# Patient Record
Sex: Male | Born: 1955 | Race: White | Hispanic: No | Smoking: Current every day smoker
Health system: Southern US, Community
[De-identification: ages and names within clinical notes are randomized; demographics above are authoritative.]

## PROBLEM LIST (undated history)

## (undated) ENCOUNTER — Inpatient Hospital Stay: Admission: EM | Payer: Self-pay | Source: Home / Self Care

## (undated) ENCOUNTER — Emergency Department (HOSPITAL_COMMUNITY): Payer: Medicare HMO | Source: Home / Self Care

## (undated) DIAGNOSIS — I779 Disorder of arteries and arterioles, unspecified: Secondary | ICD-10-CM

## (undated) DIAGNOSIS — I714 Abdominal aortic aneurysm, without rupture, unspecified: Secondary | ICD-10-CM

## (undated) DIAGNOSIS — I10 Essential (primary) hypertension: Secondary | ICD-10-CM

## (undated) DIAGNOSIS — I2510 Atherosclerotic heart disease of native coronary artery without angina pectoris: Secondary | ICD-10-CM

## (undated) DIAGNOSIS — E119 Type 2 diabetes mellitus without complications: Secondary | ICD-10-CM

## (undated) DIAGNOSIS — F17200 Nicotine dependence, unspecified, uncomplicated: Secondary | ICD-10-CM

## (undated) DIAGNOSIS — K635 Polyp of colon: Secondary | ICD-10-CM

## (undated) DIAGNOSIS — R0600 Dyspnea, unspecified: Secondary | ICD-10-CM

## (undated) DIAGNOSIS — Z87898 Personal history of other specified conditions: Secondary | ICD-10-CM

## (undated) DIAGNOSIS — J449 Chronic obstructive pulmonary disease, unspecified: Secondary | ICD-10-CM

## (undated) DIAGNOSIS — I499 Cardiac arrhythmia, unspecified: Secondary | ICD-10-CM

## (undated) DIAGNOSIS — I739 Peripheral vascular disease, unspecified: Secondary | ICD-10-CM

## (undated) DIAGNOSIS — H269 Unspecified cataract: Secondary | ICD-10-CM

## (undated) DIAGNOSIS — Z8673 Personal history of transient ischemic attack (TIA), and cerebral infarction without residual deficits: Secondary | ICD-10-CM

## (undated) DIAGNOSIS — R002 Palpitations: Secondary | ICD-10-CM

## (undated) DIAGNOSIS — I639 Cerebral infarction, unspecified: Secondary | ICD-10-CM

## (undated) DIAGNOSIS — M1990 Unspecified osteoarthritis, unspecified site: Secondary | ICD-10-CM

## (undated) DIAGNOSIS — M545 Low back pain, unspecified: Secondary | ICD-10-CM

## (undated) DIAGNOSIS — K219 Gastro-esophageal reflux disease without esophagitis: Secondary | ICD-10-CM

## (undated) DIAGNOSIS — M5416 Radiculopathy, lumbar region: Secondary | ICD-10-CM

## (undated) DIAGNOSIS — E782 Mixed hyperlipidemia: Secondary | ICD-10-CM

## (undated) HISTORY — DX: Essential (primary) hypertension: I10

## (undated) HISTORY — PX: JOINT REPLACEMENT: SHX530

## (undated) HISTORY — DX: Peripheral vascular disease, unspecified: I73.9

## (undated) HISTORY — DX: Atherosclerotic heart disease of native coronary artery without angina pectoris: I25.10

## (undated) HISTORY — DX: Unspecified cataract: H26.9

## (undated) HISTORY — PX: BREAST SURGERY: SHX581

## (undated) HISTORY — DX: Personal history of transient ischemic attack (TIA), and cerebral infarction without residual deficits: Z86.73

## (undated) HISTORY — DX: Polyp of colon: K63.5

## (undated) HISTORY — PX: KNEE SURGERY: SHX244

## (undated) HISTORY — DX: Chronic obstructive pulmonary disease, unspecified: J44.9

## (undated) HISTORY — PX: CARDIAC CATHETERIZATION: SHX172

## (undated) HISTORY — DX: Type 2 diabetes mellitus without complications: E11.9

## (undated) HISTORY — DX: Gastro-esophageal reflux disease without esophagitis: K21.9

## (undated) HISTORY — PX: HERNIA REPAIR: SHX51

## (undated) HISTORY — DX: Disorder of arteries and arterioles, unspecified: I77.9

## (undated) HISTORY — DX: Mixed hyperlipidemia: E78.2

---
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
## 1991-03-05 HISTORY — PX: UMBILICAL HERNIA REPAIR: SHX196

## 1991-03-05 HISTORY — PX: CHOLECYSTECTOMY: SHX55

## 2004-12-25 ENCOUNTER — Ambulatory Visit: Payer: Self-pay | Admitting: *Deleted

## 2005-01-01 ENCOUNTER — Encounter (HOSPITAL_COMMUNITY): Admission: RE | Admit: 2005-01-01 | Discharge: 2005-01-31 | Payer: Self-pay | Admitting: *Deleted

## 2005-01-01 ENCOUNTER — Ambulatory Visit: Payer: Self-pay | Admitting: Cardiology

## 2006-08-18 ENCOUNTER — Ambulatory Visit: Payer: Self-pay | Admitting: Cardiovascular Disease

## 2006-08-26 ENCOUNTER — Ambulatory Visit: Payer: Self-pay | Admitting: Cardiovascular Disease

## 2006-08-27 ENCOUNTER — Encounter (HOSPITAL_COMMUNITY): Admission: RE | Admit: 2006-08-27 | Discharge: 2006-09-26 | Payer: Self-pay | Admitting: Cardiovascular Disease

## 2006-08-27 ENCOUNTER — Ambulatory Visit: Payer: Self-pay | Admitting: Cardiovascular Disease

## 2006-11-24 ENCOUNTER — Emergency Department (HOSPITAL_COMMUNITY): Admission: EM | Admit: 2006-11-24 | Discharge: 2006-11-24 | Payer: Self-pay | Admitting: Emergency Medicine

## 2006-12-03 ENCOUNTER — Inpatient Hospital Stay (HOSPITAL_COMMUNITY): Admission: EM | Admit: 2006-12-03 | Discharge: 2006-12-04 | Payer: Self-pay | Admitting: Emergency Medicine

## 2006-12-05 ENCOUNTER — Encounter (HOSPITAL_COMMUNITY): Admission: RE | Admit: 2006-12-05 | Discharge: 2007-01-04 | Payer: Self-pay | Admitting: Urology

## 2007-01-20 ENCOUNTER — Ambulatory Visit: Payer: Self-pay | Admitting: Internal Medicine

## 2007-02-12 ENCOUNTER — Ambulatory Visit (HOSPITAL_COMMUNITY): Admission: RE | Admit: 2007-02-12 | Discharge: 2007-02-12 | Payer: Self-pay | Admitting: Internal Medicine

## 2007-02-12 ENCOUNTER — Ambulatory Visit: Payer: Self-pay | Admitting: Internal Medicine

## 2007-02-12 HISTORY — PX: COLONOSCOPY: SHX174

## 2007-07-05 ENCOUNTER — Emergency Department (HOSPITAL_COMMUNITY): Admission: EM | Admit: 2007-07-05 | Discharge: 2007-07-05 | Payer: Self-pay | Admitting: Emergency Medicine

## 2007-08-03 ENCOUNTER — Ambulatory Visit (HOSPITAL_COMMUNITY): Admission: RE | Admit: 2007-08-03 | Discharge: 2007-08-03 | Payer: Self-pay | Admitting: Family Medicine

## 2008-09-26 ENCOUNTER — Emergency Department (HOSPITAL_COMMUNITY): Admission: EM | Admit: 2008-09-26 | Discharge: 2008-09-26 | Payer: Self-pay | Admitting: Emergency Medicine

## 2009-03-22 ENCOUNTER — Emergency Department (HOSPITAL_COMMUNITY): Admission: EM | Admit: 2009-03-22 | Discharge: 2009-03-22 | Payer: Self-pay | Admitting: Emergency Medicine

## 2009-05-22 ENCOUNTER — Emergency Department (HOSPITAL_COMMUNITY): Admission: EM | Admit: 2009-05-22 | Discharge: 2009-05-22 | Payer: Self-pay | Admitting: Emergency Medicine

## 2009-07-24 ENCOUNTER — Ambulatory Visit (HOSPITAL_COMMUNITY): Admission: RE | Admit: 2009-07-24 | Discharge: 2009-07-24 | Payer: Self-pay | Admitting: Internal Medicine

## 2010-02-05 ENCOUNTER — Emergency Department (HOSPITAL_COMMUNITY)
Admission: EM | Admit: 2010-02-05 | Discharge: 2010-02-05 | Payer: Self-pay | Source: Home / Self Care | Admitting: Emergency Medicine

## 2010-03-17 ENCOUNTER — Emergency Department (HOSPITAL_COMMUNITY)
Admission: EM | Admit: 2010-03-17 | Discharge: 2010-03-17 | Payer: Self-pay | Source: Home / Self Care | Admitting: Emergency Medicine

## 2010-04-25 ENCOUNTER — Ambulatory Visit (INDEPENDENT_AMBULATORY_CARE_PROVIDER_SITE_OTHER): Payer: BC Managed Care – PPO | Admitting: Cardiology

## 2010-04-25 ENCOUNTER — Other Ambulatory Visit (HOSPITAL_COMMUNITY): Payer: Self-pay | Admitting: *Deleted

## 2010-04-25 ENCOUNTER — Encounter: Payer: Self-pay | Admitting: Cardiology

## 2010-04-25 ENCOUNTER — Other Ambulatory Visit: Payer: Self-pay | Admitting: Cardiology

## 2010-04-25 ENCOUNTER — Encounter (INDEPENDENT_AMBULATORY_CARE_PROVIDER_SITE_OTHER): Payer: Self-pay | Admitting: *Deleted

## 2010-04-25 DIAGNOSIS — R0602 Shortness of breath: Secondary | ICD-10-CM

## 2010-04-25 DIAGNOSIS — E782 Mixed hyperlipidemia: Secondary | ICD-10-CM

## 2010-04-25 DIAGNOSIS — I6529 Occlusion and stenosis of unspecified carotid artery: Secondary | ICD-10-CM | POA: Insufficient documentation

## 2010-04-25 DIAGNOSIS — I10 Essential (primary) hypertension: Secondary | ICD-10-CM

## 2010-04-25 DIAGNOSIS — I2510 Atherosclerotic heart disease of native coronary artery without angina pectoris: Secondary | ICD-10-CM

## 2010-04-25 DIAGNOSIS — F17200 Nicotine dependence, unspecified, uncomplicated: Secondary | ICD-10-CM | POA: Insufficient documentation

## 2010-04-25 DIAGNOSIS — R079 Chest pain, unspecified: Secondary | ICD-10-CM | POA: Insufficient documentation

## 2010-04-26 ENCOUNTER — Encounter: Payer: Self-pay | Admitting: Cardiology

## 2010-04-28 ENCOUNTER — Encounter: Payer: Self-pay | Admitting: Cardiology

## 2010-04-30 LAB — CONVERTED CEMR LAB
ALT: 19 units/L (ref 0–53)
AST: 15 units/L (ref 0–37)
Albumin: 4.4 g/dL (ref 3.5–5.2)
Alkaline Phosphatase: 87 units/L (ref 39–117)
BUN: 21 mg/dL (ref 6–23)
Bilirubin, Direct: 0.1 mg/dL (ref 0.0–0.3)
CO2: 30 meq/L (ref 19–32)
Calcium: 9.5 mg/dL (ref 8.4–10.5)
Chloride: 98 meq/L (ref 96–112)
Cholesterol: 254 mg/dL — ABNORMAL HIGH (ref 0–200)
Creatinine, Ser: 0.89 mg/dL (ref 0.40–1.50)
Glucose, Bld: 105 mg/dL — ABNORMAL HIGH (ref 70–99)
HDL: 36 mg/dL — ABNORMAL LOW (ref >39)
Indirect Bilirubin: 0.3 mg/dL (ref 0.0–0.9)
Potassium: 4.1 meq/L (ref 3.5–5.3)
Sodium: 137 meq/L (ref 135–145)
Total Bilirubin: 0.4 mg/dL (ref 0.3–1.2)
Total CHOL/HDL Ratio: 7.1
Total Protein: 7.2 g/dL (ref 6.0–8.3)
Triglycerides: 709 mg/dL — ABNORMAL HIGH (ref <150)

## 2010-05-01 NOTE — Letter (Signed)
Summary: [facility]  [facility]   Imported By: Faythe Ghee 04/26/2010 11:06:06  _____________________________________________________________________  External Attachment:    Type:   Image     Comment:   External Document

## 2010-05-01 NOTE — Assessment & Plan Note (Signed)
Summary: **PER DR.ANN WEST TO RE-ESTABLISH W/CARDIOLOGY/TG   Visit Type:  New patient Primary Provider:  Dr. Forest Gleason   History of Present Illness: 55 year old male referred to the office to establish cardiology followup. He was seen by Dr. Eden Emms back in 2008 with history of reported nonobstructive coronary artery disease, general noncompliant with medications secondary to no health insurance at the time, and other active cardiac risk factors as detailed below. He was referred for a Myoview which was reassuring, also outlined below.  He indicates he is now following up regularly with his primary care provider, has health insurance, and would like to reestablish for regular evaluation. He does indicate occasional episodes of chest pain as well as NYHA class 2-3 dyspnea on exertion. This is been present over several months.  He has not had followup of his carotid artery disease, moderate as noted back in 2008. Also reports active problems with COPD and continues to smoke cigarettes, although states that he has been trying to cut back.  I note that he is no longer on statin therapy, previously on Lipitor. He has had no recent assessment of lipids as best I can tell.  Today we discussed his medications, importance of compliance overall, and also smoking cessation.  Current Medications (verified): 1)  Lisinopril-Hydrochlorothiazide 20-12.5 Mg Tabs (Lisinopril-Hydrochlorothiazide) .... Take2  Tablets  By Mouth Once A Day 2)  Propranolol Hcl 20 Mg Tabs (Propranolol Hcl) .... Take 1 Tab Two Times A Day 3)  Aspir-Low 81 Mg Tbec (Aspirin) .... Take 1 Tab Daily 4)  Omeprazole 20 Mg Cpdr (Omeprazole) .... 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 (1 pack daily) Regular Exercise - yes Alcohol Use - yes social per pt  Past Medical History: CAD - reportedly mild, nonobstructive, reports four previous cardiac catheterizations (details uncertain) Diabetes Type 2 - managed by diet Hyperlipidemia Hypertension Carotid artery disease C O P D G E R D Hemorrhoids  Past Surgical History: Cyst removed from right breast 20 yrs ago Appendectomy 1970 Cholecystectomy 1993 Umbilical hernia repair 1993  Family History: Father: alive had coronary artery bypass grafting in his 91s Mother: alive carotid surgery Siblings: 1 brother alive and well, 1 sister alive has a blood disorder  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 (1 pack daily) Regular Exercise - yes Alcohol Use - yes social per pt  Review of Systems       The patient complains of chest pain and dyspnea on exertion.  The patient denies anorexia, fever, weight gain, syncope, peripheral edema, prolonged cough, headaches, hemoptysis, melena, and hematochezia.         Otherwise reviewed and negative except as outlined.  Vital Signs:  Patient profile:   55 year old male Height:      67 inches Weight:      168 pounds BMI:     26.41 Pulse rate:   82 / minute BP sitting:   168 / 98  (left arm)  Vitals Entered By: Dreama Saa, CNA (April 25, 2010 8:33 AM)  Physical Exam  Additional Exam:  Normally  nourished appearing male in no acute distress. HEENT: Conjunctiva and lids normal, oropharynx with poor dentition. Neck: Supple, no elevated JVP or significant carotid bruits, no thyromegaly. Lungs: Diminished breath sounds throughout, no wheezing, nonlabored. Cardiac: Regular rate and rhythm, no S3 or significant systolic murmur. Abdomen: Soft, nontender, bowel  sounds present. Skin: Warm and dry. Musculoskeletal: No kyphosis. Extremities: No pitting edema, distal pulses 1-2+. Neuropsychiatric: Alert and oriented x3, affect appropriate.    Nuclear Study  Procedure date:  08/27/2006  Findings:       Clinical data:   Mr. Alcock is a 55 year old patient of [facility] and myself.  He has been having chest pain.   Study was done to rule out ischemia.   STRESS MYOVIEW STUDY:   Patient exercised 5 minutes 30 seconds of the standard Bruce   protocol.  Exercise was stopped due to fatigue.  Maximum heart rate   was 142.  Peak blood pressure was 198/92.   SCINTIGRAHIC RESULTS:  Images were reconstructed in the short axis,   vertical, and horizontal long axis.  Resting and stress images were   normal.  There is no evidence of ischemia or infarction.   IMPRESSION:   Normal stress Myoview study.  Ejection fraction 52%.  MRI EXAM  Procedure date:  08/27/2006  Findings:       IMPRESSION:   1.  Moderate approximately 50% stenosis of the right external carotid   artery at its origin.   2.  Focal saccular prominence at the proximal portion of the right   lingual artery measuring approximately 5 mm.  This may represent a   small pseudoaneurysm versus tortuosity of the vessel.   3.  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  EKG  Procedure date:  04/25/2010  Findings:      Normal sinus rhythm at 80 beats per minute with evidence of left ventricular hypertrophy.  Impression & Recommendations:  Problem # 1:  CORONARY ATHEROSCLEROSIS NATIVE CORONARY ARTERY (ICD-414.01)  Reportedly mild and nonobstructive based on previous cardiac catheterizations, details not at all clear. Last formal ischemic assessment was in 2008 via Myoview. He does report intermittent chest pain and shortness of breath with activity. ECG is relatively nonspecific with mild LVH. Complicating his management has been in  general noncompliance with both followup and medications over time, although he is reestablishing now and indicates that he intends to take his medications and present for followup visits. I reviewed his present regimen. We will advance antihypertensives as noted below, and also plan to schedule an exercise echocardiogram for further evaluation. I will see him back in the office to discuss the results.  The following medications were removed from the medication list:    Aspirin 325 Mg Tabs (Aspirin)    Univasc 15 Mg Tabs (Moexipril hcl) His updated medication list for this problem includes:    Lisinopril-hydrochlorothiazide 20-12.5 Mg Tabs (Lisinopril-hydrochlorothiazide) .Marland Kitchen... Take2  tablets  by mouth once a day    Propranolol Hcl 20 Mg Tabs (Propranolol hcl) .Marland Kitchen... Take 1 tab two times a day    Aspir-low 81 Mg Tbec (Aspirin) .Marland Kitchen... Take 1 tab daily  Problem # 2:  CAROTID ARTERY DISEASE (ICD-433.10)  Moderate as of 2008 with no subsequent followup. Followup carotid duplex imaging would be obtained. He is back on aspirin. Also need to better understand lipid status.  The following medications were removed from the medication list:    Aspirin 325 Mg  Tabs (Aspirin) His updated medication list for this problem includes:    Aspir-low 81 Mg Tbec (Aspirin) .Marland Kitchen... Take 1 tab daily  Problem # 3:  MIXED HYPERLIPIDEMIA (ICD-272.2)  Previously on statin therapy. Followup lipid profile will be obtained.  The following medications were removed from the medication list:    Tricor 145 Mg Tabs (Fenofibrate)    Lipitor 20 Mg Tabs (Atorvastatin calcium)  Problem # 4:  NONDEPENDENT TOBACCO USE DISORDER (ICD-305.1)  We discussed smoking cessation today, including strategies to help him quit, also quit line number.  Problem # 5:  ESSENTIAL HYPERTENSION, BENIGN (ICD-401.1)  Blood pressure not well controlled. Patient states that he is taking his medications. We will increase lisinopril hydrochlorothiazide to  40/25 mg daily. BMET will be obtained.  The following medications were removed from the medication list:    Aspirin 325 Mg Tabs (Aspirin)    Univasc 15 Mg Tabs (Moexipril hcl) His updated medication list for this problem includes:    Lisinopril-hydrochlorothiazide 20-12.5 Mg Tabs (Lisinopril-hydrochlorothiazide) .Marland Kitchen... Take2  tablets  by mouth once a day    Propranolol Hcl 20 Mg Tabs (Propranolol hcl) .Marland Kitchen... Take 1 tab two times a day    Aspir-low 81 Mg Tbec (Aspirin) .Marland Kitchen... Take 1 tab daily  Other Orders: Stress Echo (Stress Echo) Carotid Duplex (Carotid Duplex) Future Orders: T-Basic Metabolic Panel [id number]) ... 04/30/2010 T-Lipid Profile [id number]) ... 04/30/2010 T-Hepatic Function [id number]) ... 04/30/2010  Patient Instructions: 1)  Your physician recommends that you schedule a follow-up appointment in: 1 MONTH 2)  Your physician recommends that you return for lab work in: NEXT WEEK  3)  Your physician has recommended you make the following change in your medication: INCREASE LISINOPRIL/HCTZ TO 2 TABLETS DAILY 4)  Your physician has requested that you have a carotid duplex. This test is an ultrasound of the carotid arteries in your neck. It looks at blood flow through these arteries that supply the brain with blood. Allow one hour for this exam. There are no restrictions or special instructions. 5)  Your physician has requested that you have a stress echocardiogram. For further information please visit [email or web address]  Please follow instruction sheet as given. Prescriptions: LISINOPRIL-HYDROCHLOROTHIAZIDE 20-12.5 MG TABS (LISINOPRIL-HYDROCHLOROTHIAZIDE) Take2  tablets  by mouth once a day  #60 x 3   Entered by:   Teressa Lower RN   Authorized by:   Loreli Slot, MD, [facility]   Signed by:   Teressa Lower RN on 04/25/2010   Method used:   Electronically to        [facility] [address] * (retail)       [address]       [place], [place]  [address]       Ph:  [phone] or [phone]       Fax: [phone]   RxID:   [id number]

## 2010-05-01 NOTE — Letter (Signed)
Summary: Stress Echocardiogram Information Sheet  [facility]  [address], [place] [address]   Phone: [phone]  Fax: [phone]      April 25, 2010 MRN: [id number] light prior to the test.   Jimmy Duran  Doctor: Appointment Date: Appointment Time: Appointment Location: [facility]  Stress Echocardiogram Information Sheet    Instructions:   1. DO NOT  take your AM   medicine MORNING before the test.  2.NOTHING TO EAT AFTER MIDNIGHT PRIOR TO THE TEST  3. Dress prepared to exercise.  4. DO NOT use ANY caffine or tobacco products 3 hours before appointment.  5. Report to the Short Stay Center on the1st floor.  6. Please bring all current prescription medications.  7. If you have any questions, please call [phone]

## 2010-05-01 NOTE — Letter (Signed)
Summary: [place] [facility], [facility] at [facility]  [address] [place] [address]   Phone: [phone]  Fax: [phone]     April 25, 2010 MRN: [id number]   Jimmy Duran [address] [place], [place]  [address]      YOUR LAB WORK IS DUE   MONDAY  [id number], 2012  Please go to [facility], located across the street from [facility] on the second floor.  Hours are Monday - Friday 7am until 7:30pm         Saturday 8am until 12noon    __  DO NOT EAT OR DRINK AFTER MIDNIGHT EVENING PRIOR TO LABWORK  __ YOUR LABWORK IS NOT FASTING --YOU MAY EAT PRIOR TO LABWORK

## 2010-05-04 ENCOUNTER — Encounter: Payer: Self-pay | Admitting: *Deleted

## 2010-05-04 DIAGNOSIS — E119 Type 2 diabetes mellitus without complications: Secondary | ICD-10-CM | POA: Insufficient documentation

## 2010-05-04 DIAGNOSIS — K649 Unspecified hemorrhoids: Secondary | ICD-10-CM | POA: Insufficient documentation

## 2010-05-04 DIAGNOSIS — K219 Gastro-esophageal reflux disease without esophagitis: Secondary | ICD-10-CM | POA: Insufficient documentation

## 2010-05-04 DIAGNOSIS — E1159 Type 2 diabetes mellitus with other circulatory complications: Secondary | ICD-10-CM | POA: Insufficient documentation

## 2010-05-04 DIAGNOSIS — J449 Chronic obstructive pulmonary disease, unspecified: Secondary | ICD-10-CM

## 2010-05-07 ENCOUNTER — Encounter: Payer: Self-pay | Admitting: Cardiology

## 2010-05-08 ENCOUNTER — Encounter: Payer: Self-pay | Admitting: Cardiology

## 2010-05-08 ENCOUNTER — Ambulatory Visit (HOSPITAL_COMMUNITY)
Admission: RE | Admit: 2010-05-08 | Discharge: 2010-05-08 | Disposition: A | Discharge status: Home / Self Care | Payer: BC Managed Care – PPO | Source: Ambulatory Visit | Attending: Cardiology | Admitting: Cardiology

## 2010-05-08 ENCOUNTER — Ambulatory Visit (HOSPITAL_COMMUNITY): Payer: BC Managed Care – PPO

## 2010-05-08 DIAGNOSIS — J449 Chronic obstructive pulmonary disease, unspecified: Secondary | ICD-10-CM | POA: Insufficient documentation

## 2010-05-08 DIAGNOSIS — R079 Chest pain, unspecified: Secondary | ICD-10-CM | POA: Insufficient documentation

## 2010-05-08 DIAGNOSIS — R55 Syncope and collapse: Secondary | ICD-10-CM | POA: Insufficient documentation

## 2010-05-08 DIAGNOSIS — I2510 Atherosclerotic heart disease of native coronary artery without angina pectoris: Secondary | ICD-10-CM

## 2010-05-08 DIAGNOSIS — J4489 Other specified chronic obstructive pulmonary disease: Secondary | ICD-10-CM | POA: Insufficient documentation

## 2010-05-08 DIAGNOSIS — E119 Type 2 diabetes mellitus without complications: Secondary | ICD-10-CM | POA: Insufficient documentation

## 2010-05-08 DIAGNOSIS — R072 Precordial pain: Secondary | ICD-10-CM

## 2010-05-09 ENCOUNTER — Encounter: Payer: Self-pay | Admitting: Cardiology

## 2010-05-15 NOTE — Letter (Signed)
Summary: [place] [facility] Engineer, agricultural at [facility]  [address], [place] [address]   Phone: [phone]  Fax: [phone]      May 09, 2010 MRN: [id number]   Jimmy Duran [address] [place], [place]  [address]   Dear [name],  Your test ordered by Selena Batten has been reviewed by your physician (or physician assistant) and was found to be normal or stable. Your physician (or physician assistant) felt no changes were needed at this time.  __X__ Echocardiogram  ____ Cardiac Stress Test  ____ Lab Work  __X__ Peripheral vascular study of arms, legs or neck  ____ CT scan or X-ray  ____ Lung or Breathing test  ____ Other:  Please continue on current medical treatment.  Thank you.   [name], MD, F.A.C.C

## 2010-05-15 NOTE — Letter (Signed)
Summary: [place] Results Engineer, agricultural at [facility]  [address] [place] [address]   Phone: [phone]  Fax: [phone]      May 09, 2010 MRN: [id number]   ABDULAHI SCHOR [address] [place], [place]  [address]   Dear Mr. PHEGLEY,  Your test ordered by Selena Batten has been reviewed by your physician (or physician assistant) and was found to be normal or stable. Your physician (or physician assistant) felt no changes were needed at this time.  ____ Echocardiogram  __X__ Cardiac Stress Test  ____ Lab Work  ____ Peripheral vascular study of arms, legs or neck  ____ CT scan or X-ray  ____ Lung or Breathing test  ____ Other:  Please continue on current medical treatment.  Thank you.   [name], MD, F.A.C.C

## 2010-05-18 ENCOUNTER — Emergency Department (HOSPITAL_COMMUNITY)
Admission: EM | Admit: 2010-05-18 | Discharge: 2010-05-18 | Disposition: A | Discharge status: Home / Self Care | Payer: BC Managed Care – PPO | Attending: Emergency Medicine | Admitting: Emergency Medicine

## 2010-05-18 ENCOUNTER — Emergency Department (HOSPITAL_COMMUNITY): Payer: BC Managed Care – PPO

## 2010-05-18 DIAGNOSIS — B9789 Other viral agents as the cause of diseases classified elsewhere: Secondary | ICD-10-CM | POA: Insufficient documentation

## 2010-05-18 DIAGNOSIS — J449 Chronic obstructive pulmonary disease, unspecified: Secondary | ICD-10-CM | POA: Insufficient documentation

## 2010-05-18 DIAGNOSIS — J4489 Other specified chronic obstructive pulmonary disease: Secondary | ICD-10-CM | POA: Insufficient documentation

## 2010-05-18 DIAGNOSIS — R05 Cough: Secondary | ICD-10-CM | POA: Insufficient documentation

## 2010-05-18 DIAGNOSIS — R059 Cough, unspecified: Secondary | ICD-10-CM | POA: Insufficient documentation

## 2010-05-18 DIAGNOSIS — R0602 Shortness of breath: Secondary | ICD-10-CM | POA: Insufficient documentation

## 2010-05-18 LAB — GLUCOSE, CAPILLARY: Glucose-Capillary: 143 mg/dL — ABNORMAL HIGH (ref 70–99)

## 2010-05-28 ENCOUNTER — Encounter: Payer: Self-pay | Admitting: Cardiology

## 2010-05-28 ENCOUNTER — Ambulatory Visit (INDEPENDENT_AMBULATORY_CARE_PROVIDER_SITE_OTHER): Payer: BC Managed Care – PPO | Admitting: Cardiology

## 2010-05-28 VITALS — BP 133/92 | HR 92 | Ht 67.0 in | Wt 169.0 lb

## 2010-05-28 DIAGNOSIS — I6529 Occlusion and stenosis of unspecified carotid artery: Secondary | ICD-10-CM

## 2010-05-28 DIAGNOSIS — I10 Essential (primary) hypertension: Secondary | ICD-10-CM

## 2010-05-28 DIAGNOSIS — E782 Mixed hyperlipidemia: Secondary | ICD-10-CM

## 2010-05-28 DIAGNOSIS — I2510 Atherosclerotic heart disease of native coronary artery without angina pectoris: Secondary | ICD-10-CM

## 2010-05-28 NOTE — Assessment & Plan Note (Signed)
Continue therapy with omega-3 supplements and Lipitor, aiming to increase omega-3 supplements to total of 4 g daily. Followup fasting lipid profile and liver function tests in 2 months.

## 2010-05-28 NOTE — Assessment & Plan Note (Signed)
Discussed diet, sodium restriction. Continue present regimen.

## 2010-05-28 NOTE — Assessment & Plan Note (Signed)
Clinically stable. Recent followup exercise echocardiogram was reassuring.

## 2010-05-28 NOTE — Progress Notes (Signed)
Clinical Summary Jimmy Duran is a 55 y.o.male Presenting for followup. He was seen in February to reestablish cardiology care, previously seen by Dr. Eden Emms.  In addition to medical therapy adjustments, he was referred for an exercise echocardiogram to reassess for ischemia. The study was performed on 6 March, demonstrating no significant ST segment changes, maximum workload of 10.1 METs, no chest pain, and no inducible wall motion abnormalities to indicate ischemia.  Additional recent studies include chest x-ray from April 06, 2024demonstrating mild bronchitic changes but no focal acute abnormalities, and also a carotid duplex from 6 March showing 50-69% left internal carotid artery stenosis, and less than 50% stenosis in the right internal carotid artery.  Recent lab work from February showed sodium 137, potassium 4.1, BUN 21, creatinine 0.8, cholesterol 254, triglycerides 709, HDL 36, LDL not calculated, AST 15, ALT 19. It was recommended that he begin omega-3 supplements and resume Lipitor.  He was seen in the ER recently with an upper respiratory tract infection. He is using an inhaler. Reports improved symptoms.  Today we reviewed the results of his studies, discussed risk factor modification strategies, also smoking cessation which he continues to work on.  No Known Allergies  Current Outpatient Prescriptions on File Prior to Visit  Medication Sig Dispense Refill  . aspirin 81 MG tablet Take 81 mg by mouth daily.        Marland Kitchen atorvastatin (LIPITOR) 20 MG tablet Take 20 mg by mouth daily.        . fish oil-omega-3 fatty acids 1000 MG capsule Take 2 g by mouth 2 (two) times daily.        Marland Kitchen lisinopril-hydrochlorothiazide (PRINZIDE,ZESTORETIC) 20-12.5 MG per tablet Take 1 tablet by mouth 2 (two) times daily. Take 2 tabs daily       . omeprazole (PRILOSEC) 20 MG capsule Take 20 mg by mouth daily.        . propranolol (INDERAL) 20 MG tablet Take 20 mg by mouth 2 (two) times daily.          Past  Medical History  Diagnosis Date  . Coronary artery disease     Nonobstructive  . Type 2 diabetes mellitus   . Mixed hyperlipidemia   . Essential hypertension, benign   . Carotid artery disease   . COPD (chronic obstructive pulmonary disease)   . GERD (gastroesophageal reflux disease)   . Hemorrhoids     Social History Jimmy Duran reports that he has been smoking Cigarettes.  He has been smoking about 1 pack per day. He has never used smokeless tobacco. Jimmy Duran reports that he drinks alcohol.  Review of Systems No fevers, chills, or unusual weight change. No chest pain, progressive shortness of breath. No palpitations, dizziness, or syncope. No dysphasia or odynophagia. Stable appetite with no abdominal pain, melena, or hematochezia. No orthopnea, PND, or lower extremity edema. No focal motor weakness, memory problems, or speech deficits. Otherwise systems reviewed and negative except as already outlined.   Physical Examination Filed Vitals:   05/28/10 1254  BP: 133/92  Pulse: 92  Normally nourished appearing male in no acute distress. HEENT: Conjunctiva and lids normal, oropharynx with poor dentition. Neck: Supple, no elevated JVP or significant carotid bruits, no thyromegaly. Lungs: Diminished breath sounds throughout, no wheezing, nonlabored. Cardiac: Regular rate and rhythm, no S3 or significant systolic murmur. Abdomen: Soft, nontender, bowel sounds present. Skin: Warm and dry. Musculoskeletal: No kyphosis. Extremities: No pitting edema, distal pulses 1-2+. Neuropsychiatric: Alert and oriented x3, affect appropriate.  Problem List and Plan

## 2010-05-28 NOTE — Assessment & Plan Note (Signed)
Recent carotid Dopplers reviewed. 50-69% left internal carotid artery stenosis is noted, less than 50% on the right. Continue aspirin, lipid management strategies, efforts at smoking cessation. We can repeat this study next March.

## 2010-05-28 NOTE — Assessment & Plan Note (Signed)
Smoking cessation again discussed. 

## 2010-05-28 NOTE — Patient Instructions (Signed)
Your physician recommends that you schedule a follow-up appointment in: 6 months Your physician recommends that you return for lab work in: 2 months Your physician has recommended you make the following change in your medication: increase Omega to 2 tablets twice daily (after a month)

## 2010-06-08 ENCOUNTER — Encounter: Payer: Self-pay | Admitting: *Deleted

## 2010-06-25 ENCOUNTER — Emergency Department (HOSPITAL_COMMUNITY)
Admission: EM | Admit: 2010-06-25 | Discharge: 2010-06-25 | Disposition: A | Discharge status: Home / Self Care | Payer: BC Managed Care – PPO | Attending: Emergency Medicine | Admitting: Emergency Medicine

## 2010-06-25 DIAGNOSIS — J4489 Other specified chronic obstructive pulmonary disease: Secondary | ICD-10-CM | POA: Insufficient documentation

## 2010-06-25 DIAGNOSIS — J449 Chronic obstructive pulmonary disease, unspecified: Secondary | ICD-10-CM | POA: Insufficient documentation

## 2010-06-25 DIAGNOSIS — Z79899 Other long term (current) drug therapy: Secondary | ICD-10-CM | POA: Insufficient documentation

## 2010-06-25 DIAGNOSIS — E7800 Pure hypercholesterolemia, unspecified: Secondary | ICD-10-CM | POA: Insufficient documentation

## 2010-06-25 DIAGNOSIS — I2510 Atherosclerotic heart disease of native coronary artery without angina pectoris: Secondary | ICD-10-CM | POA: Insufficient documentation

## 2010-06-25 DIAGNOSIS — E119 Type 2 diabetes mellitus without complications: Secondary | ICD-10-CM | POA: Insufficient documentation

## 2010-06-25 DIAGNOSIS — K029 Dental caries, unspecified: Secondary | ICD-10-CM | POA: Insufficient documentation

## 2010-06-25 DIAGNOSIS — I10 Essential (primary) hypertension: Secondary | ICD-10-CM | POA: Insufficient documentation

## 2010-06-25 DIAGNOSIS — R51 Headache: Secondary | ICD-10-CM | POA: Insufficient documentation

## 2010-06-26 ENCOUNTER — Telehealth: Payer: Self-pay | Admitting: Cardiology

## 2010-06-26 MED ORDER — LISINOPRIL-HYDROCHLOROTHIAZIDE 20-12.5 MG PO TABS: 1.0000 | ORAL_TABLET | Freq: Two times a day (BID) | ORAL | Status: DC

## 2010-06-26 NOTE — Telephone Encounter (Signed)
Pt was told to take lisinopril twice a day instead of once and has run out of meds. Pt uses [facility] in [place].

## 2010-07-17 NOTE — Op Note (Signed)
NAME:  MARQUIS, DOWN NO.:  [id number]   MEDICAL RECORD NO.:  [id number]          PATIENT TYPE:  INP   LOCATION:  A309                          FACILITY:  [facility]   PHYSICIAN:  Dennie Maizes, M.D.   DATE OF BIRTH:  1955-07-18   DATE OF PROCEDURE:  12/04/2006  DATE OF DISCHARGE:  12/04/2006                               OPERATIVE REPORT   PREOPERATIVE DIAGNOSIS:  Infected cyst of the scrotum.   POSTOPERATIVE DIAGNOSIS:  Infected cyst of the scrotum.   OPERATIVE PROCEDURE:  Incision and drainage of infected cyst of the  scrotum.   ANESTHESIA:  General.   SURGEON:  Dennie Maizes, M.D.   COMPLICATIONS:  None.   ESTIMATED BLOOD LOSS:  Minimal.   DRAINS:  One.   PACKING:  With Iodoform gauze.   INDICATIONS FOR THE PROCEDURE:  This 55 year old male has diabetes  mellitus and other medical problems.  He came to the emergency room with  scrotal pain and swelling.  The clinical features were suggestive of  right infected sebaceous cyst of the scrotum.  The patient was taken to  the operating room today for incision and drainage of the infected cyst  of the scrotum.   DESCRIPTION OF THE PROCEDURE:  General anesthesia was induced, and the  patient was placed on the O.R. table in the dorsal lithotomy position.  The lower abdomen and genitalia were prepped and draped in a sterile  fashion.  Examination revealed induration of the scrotum with  fluctuation on the left scrotal wall.  The swelling measured about 6 x 5  cm in size.  An incision was made over the most fluctuant part of the  swelling.  A small amount of pus was drained.  Pus was sent for culture  and sensitivity, for aerobic as well as anaerobic organisms.  The wound  cavity was then explored with a finger, and all the loculi were broken.  The wound cavity was then irrigated with dilute Betadine, saline, and  hydrogen peroxide solution.  Bleeders were cauterized, and complete  hemostasis was obtained.   The wound cavity was then packed with Iodoform  gauze.  A scrotal dressing was applied.  The sponges and instruments  were correct at the time of conclusion of the procedure.  The patient  was transferred to the PACU in a satisfactory condition.      Dennie Maizes, M.D.  Electronically Signed     SK/MEDQ  D:  12/04/2006  T:  12/05/2006  Job:  [id number]

## 2010-07-17 NOTE — Consult Note (Signed)
NAME:  Jimmy Duran, Jimmy Duran NO.:  [id number]   MEDICAL RECORD NO.:  [id number]          PATIENT TYPE:  AMB   LOCATION:  DAY                           FACILITY:  [facility]   PHYSICIAN:  R. Roetta Sessions, M.D. DATE OF BIRTH:  1956/02/08   DATE OF CONSULTATION:  01/20/2007  DATE OF DISCHARGE:                                 CONSULTATION   REFERRING PHYSICIAN:  Dr. Lewis Moccasin.   REASON FOR CONSULTATION:  Intermittent hematochezia.   HISTORY OF PRESENT ILLNESS:  Jimmy Duran is a 55 year old, Caucasian male  who gives a several year history of intermittent, small-volume  hematochezia.  He has mostly notice bright blood red blood on the toilet  paper with bowel movements.  He denies any proctalgia or pruritus.  He  does have intermittent transient abdominal pains, but tells me nothing  out of the usual.  He denies any history of hard stools or constipation.  He does generally have three loose to semi-formed stools a day.  He has  history of chronic GERD previously on Nexium 40 mg daily, well-  controlled.  He has recently switched to omeprazole 20 mg daily.  He  feels his symptoms are not as well-controlled, but overall he is doing  okay.  He does have some nighttime water brash.  Denies any dysphagia,  odynophagia, anorexia or early satiety and his weight has remained  stable.  Jimmy Duran admits to taking about six BC powders a day.   PAST SURGICAL HISTORY:  1. Colonoscopy about 12 years ago in [place], [place].  He was found      to have hemorrhoids, otherwise normal exam per his report.  2. Status post appendectomy.  3. EGD.   PAST MEDICAL HISTORY:  1. History of hypertension.  2. Hypercholesterolemia.  3. Diabetes mellitus.  4. Chronic GERD.  He had an EGD about 10 years ago in [place] and was      told he had erosions and otherwise normal exam.  5. COPD.   CURRENT MEDICATIONS:  1. BC powders six per day.  2. Omeprazole 20 mg daily.  3. Univasc 15 mg daily.  4.  Norvasc 5 mg daily.  5. Valium 2 mg p.r.n.  6. Xanax 0.5 mg p.r.n.   ALLERGIES:  No known drug allergies.   FAMILY HISTORY:  Positive for multiple second-degree relatives with  colon cancer, although no first degree.  His mother has history of  hypertension and anxiety.  Father has history of coronary disease.  One  sister with diabetes mellitus and one brother who is healthy.   SOCIAL HISTORY:  Jimmy Duran is separated.  He does have a fiance.  He  has five healthy children.  He is unemployed.  He has a 40-pack-year  history of tobacco use.  He consumes a couple of alcoholic beverages per  year socially.  He denies any drug use.   REVIEW OF SYSTEMS:  GI:  See HPI.  CONSTITUTIONAL:  He has noted some  dizziness.  He is scheduled for a head CT and is to follow up with his  PCP regarding this.  Otherwise, negative review of systems.   PHYSICAL EXAMINATION:  VITAL SIGNS:  Weight 176 pounds, height 67  inches, temperature 98 degrees, blood pressure 158/100 and pulse 88.  GENERAL:  Jimmy Duran is a well-developed, well-nourished, Caucasian male  in no acute distress.  HEENT:  Sclerae clear.  Nonicteric.  Conjunctivae pink.  Oropharynx pink  and moist without lesions.  NECK:  Supple without thyromegaly.  HEART:  Regular rate and rhythm.  Normal S1, S2, no murmurs, clicks,  rubs or gallops.  LUNGS:  Clear to auscultation bilaterally.  ABDOMEN:  Positive bowel sounds x4.  No bruits auscultated.  Soft,  nontender, nondistended without palpable mass or hepatosplenomegaly.  No  rebound or guarding.  EXTREMITIES:  Without edema.  He does have clubbing.   IMPRESSION:  1. Jimmy Duran is a 55 year old, Caucasian male with a several year      history of intermittent, small-volume hematochezia.  Given the      chronicity of his symptoms and his age, we need to further evaluate      to rule out colorectal carcinoma.  Other etiologies could include      benign anorectal source such as hemorrhoids  or fissure.  2. He has chronic gastroesophageal reflux disease well-controlled on      proton pump inhibitor.  3. Hypertension.  His blood pressure remained elevated and was checked      twice in the office.  I would like him to follow up with his      primary care physician prior to the colonoscopy.   RECOMMENDATIONS:  1. Colonoscopy with Dr. Jena Gauss in the near future.  I discussed the      procedure including risks, benefits, to include, but not limited to      bleeding, infection, perforation or drug reaction.  He agrees to      plan and consent will be obtained.  No BC powders 3 days prior to      the exam.  2. He is to call [facility] regarding his hypertension and      have this addressed prior to his colonoscopy.  He agrees with this      plan.   I would like to thank Lynnell Chad, NP and Dr. Lewis Moccasin for allowing  [name] to participate in the care of Jimmy Duran.      Lorenza Burton, N.P.      Jonathon Bellows, M.D.  Electronically Signed    KJ/MEDQ  D:  01/20/2007  T:  01/20/2007  Job:  [id number]

## 2010-07-17 NOTE — Assessment & Plan Note (Signed)
[facility]                       [place] [facility] OFFICE NOTE   ALAIN, DESCHENE                     MRN:          [id number]  DATE:08/18/2006                            DOB:          [birth year]    Mr. Sao is seen today as a new patient at the request of [facility].  He is 55 years old.  He has multiple coronary  risk factors.  Due to insurance reasons, he has had poor medical follow-  up and noncompliance with his medications.  In talking to the patient,  he has had substernal chest pain for many, many years.  He apparently  has had previous heart catheterizations and not found to have critical  coronary artery disease.  There is a note in the chart previously from  Dr. Dorethea Clan indicating 4 previous catheterizations without critical  disease.  His last Myoview was done in October 2006 and was nonischemic  with an EF of 55%.  He has type 2 diabetes, hypertension,  hyperlipidemia, and tobacco abuse.   The patient's chest pain is atypical.  It can be while sitting, and it  is not necessarily exertional.  It is fleeting.  He also notes that he  occasionally gets palpitations.  They tend to be rapid.  They can occur  every day.  He has not had syncope from them.   His chest pain is not progressive.  It is not very long in duration.  It  is not associated with any radiation to the arms.  He has not had to  take nitroglycerin for them.  Because of insurance reasons and job  changes, he has only been on his medications for about a week.  Apparently his medicines were not allowed to be refilled until he  followed up with his primary care MD.   His review of systems is otherwise negative.   He is currently taking:  1. Nexium 40 mg a day.  2. Glyset 2.5 mg a day.  3. Tricor 145 mg a day.  4. Lipitor 20 mg a day.  5. Lunesta 2 mg at night.  6. An aspirin a day.  7. Univasc 15 mg a day.   He denies any significant  allergies.   Family  History non-contributory   PAST MEDICAL HISTORY:  Otherwise remarkable for previous cyst removal  from the right chest area and, as indicated, his multiple coronary risk  factors and chronic atypical chest pain.   The patient is separated.  Apparently he also has a fiancee.  He has two  young children, who are in the lobby.  He smokes a pack a day and  clinically has COPD.  A lot of his problems have revolved around his  work.  He used to work at [employer] and was laid off.  He  subsequently worked for [employer].  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  He currently is operating  heavy equipment and seems much happier.  He  is fairly sedentary.  He  does not drink excessively.  PAST SURGICAL HISTORY:  Otherwise remarkable for an appendectomy in 1970  and a cholecystectomy in 1993.   PHYSICAL EXAMINATION:  GENERAL:  Remarkable for a middle-aged white male  who looks a little older than his stated age.  Affect is appropriate.  HEENT:  Dentition is poor.  HEENT is unremarkable.  NECK:  There is a faint right carotid bruit.  There is no thyromegaly or  lymphadenopathy, no JVP elevation.  LUNGS:  Clear with no obvious wheezing.  CARDIAC:  There is an S1-S2 with normal heart sounds.  PMI is normal.  ABDOMEN:  Benign.  Bowel sounds positive.  No tenderness.  He is status  post previous cholecystectomy.  There is no hepatosplenomegaly or  hepatojugular reflux.  Femorals are +3 bilaterally without bruits.  EXTREMITIES:  Distal pulses are intact, with no edema.  NEUROLOGIC:  Nonfocal.  There is no neuromuscular weakness.  SKIN:  Warm and dry.   His baseline EKG is normal.   IMPRESSION:  1. Atypical chest pain but continued coronary risk factors.  The      patient will be referred for stress Myoview testing.  It is      encouraging that his previous workups have been negative.  2. Hypertension, currently well-controlled  with Univasc.  Continue      current dose.  3. Hyperlipidemia.  Follow up with primary care MD for lipid and liver      profile.  Continue Lipitor and Tricor.  4. Diabetes.  Follow up quarterly hemoglobin A1c with primary care      doctor.  Continue Glyset.  5. Smoking cessation.  The patient has been trying to cut back.  He is      currently not interested in Wellbutrin or Chantix.  I encouraged      him to follow up with his.  I think clinically he probably already      has some mild chronic obstructive pulmonary disease.  I will      recommend to his primary care MD that he get baseline PFTs pre and      post bronchodilator.   As long as his stress test is nonischemic, I will see him on a yearly  basis and he will continue to follow up with [facility] in  regard to his risk factors.     Noralyn Pick. Eden Emms, MD, [facility]  Electronically Signed    PCN/MedQ  DD: 08/18/2006  DT: 08/19/2006  Job #: [id number]   cc:   Georgina Quint, PA-C

## 2010-07-17 NOTE — Discharge Summary (Signed)
NAME:  Jimmy Duran, Jimmy Duran NO.:  [id number]   MEDICAL RECORD NO.:  [id number]          PATIENT TYPE:  INP   LOCATION:  A309                          FACILITY:  [facility]   PHYSICIAN:  Dennie Maizes, M.D.   DATE OF BIRTH:  Jun 01, 1955   DATE OF ADMISSION:  12/03/2006  DATE OF DISCHARGE:  10/02/2008LH                               DISCHARGE SUMMARY   FINAL DIAGNOSIS:  Infected sebaceous cyst of the scrotum.   OTHER DIAGNOSES:  1. Diabetes mellitus.  2. Hypertension.  3. Coronary artery disease.   OPERATIVE PROCEDURE:  Incision and drainage of infected sebaceous cyst  of the scrotum done on December 03, 2006.   COMPLICATIONS:  None.   This 55 year old male has multiple medical problems.  His problems  include diabetes, hypertension, and coronary artery disease.  He noticed  a boil on the left side of the scrotum about two weeks ago.  It started  increasing in size with pain for the past two days.  The patient also  had noticed some dark areas of the swelling.  There is no drainage from  the swelling.  He denied having any fever, chills, voiding difficulty,  gross hematuria, dysuria.  Was seen by his family physician, and he was  directed to come to the emergency room for further evaluation and  management.  The patient denied having similar problems in the past.   PAST MEDICAL HISTORY:  History of COPD, hypertension, diabetes mellitus,  headache, hypercholesterolemia, coronary artery disease, carotid artery  occlusive disease, status post appendectomy, status post  cholecystectomy.   MEDICATIONS:  Univasc, Nexium, Lipitor, Tricor, Glyset, and Valium.   ALLERGIES:  None.   FAMILY HISTORY:  Positive for coronary artery disease, hypertension, and  CVA.   PHYSICAL EXAMINATION:  VITAL SIGNS:  Temperature 98.4 degrees  Fahrenheit, blood pressure 163/109, temp 98.6, respirations 18.  HEENT:  Normal.  NECK:  No masses.  LUNGS:  Clear to auscultation.  HEART:  Regular  rate and rhythm.  No murmurs.  ABDOMEN:  Soft.  No palpable flank mass or costovertebral angle  tenderness.  Bladder was not palpable.  GENITALIA:  The penis is circumcised and normal.  Testes are normal.  There is localized swelling in the left hemiscrotal skin which is  indurated and partially fluctuant.  Multiple puncti are noted over the  swelling.  Clinical features are suggestive of infected sebaceous cyst  of the scrotum.  Size of the swelling was about 6.5 cm.  RECTAL:  Examination not done.   LABORATORY DATA:  CBC:  WBC 9.3, hemoglobin 15.6, hematocrit 44.5.   COURSE IN THE HOSPITAL:  This patient was started on IV gentamicin and  Rocephin and treated with IV fluids.  Was taken to the OR on December 04, 2006 and underwent incision and drainage of the infectious cyst of the  scrotum.  Also sent for culture and sensitivity.  The patient did well  in the postoperative period.  In spite of medical advice he did not want  to stay in the hospital.  Was discharged and sent home on December 04, 2006.  He was started on Bactrim and Percocet.  He will attend the  physical therapy department, dressing changes.  He will be reviewed in  the office on December 09, 2006.  The condition of the patient at time of  discharge is stable.  He will call me for fever, chills, increasing  swelling of pain.  Culture and sensitivity was pending at the time of  discharge.      Dennie Maizes, M.D.  Electronically Signed     SK/MEDQ  D:  12/22/2006  T:  12/23/2006  Job:  [id number]

## 2010-07-17 NOTE — H&P (Signed)
NAME:  Jimmy Duran, Jimmy Duran NO.:  [id number]   MEDICAL RECORD NO.:  [id number]          PATIENT TYPE:  INP   LOCATION:  A309                          FACILITY:  [facility]   PHYSICIAN:  Dennie Maizes, M.D.   DATE OF BIRTH:  04/28/1955   DATE OF ADMISSION:  12/03/2006  DATE OF DISCHARGE:  LH                              HISTORY & PHYSICAL   PRIORITY PREOPERATIVE HISTORY AND PHYSICAL   CHIEF COMPLAINT:  Swelling and pain in the scrotum.   HISTORY OF PRESENT ILLNESS:  This 55 year old male has multiple medical  problems.  These include diabetes, hypertension, and coronary artery  disease.  He noticed a boil over the left side of his scrotum about two  weeks ago.  It started increasing in size with pain for the past two  days.  The patient also has noticed some dark areas over the swelling.  There is no drainage from the swelling.  The patient denied having any  fever, chills, voiding difficulty, gross hematuria, or dysuria.  He was  seen by his family physician and was directed to come to the emergency  room for further evaluation and management.  The patient denied having  similar problems in the past.   PAST MEDICAL HISTORY:  1. History of COPD.  2. Hypertension.  3. Diabetes mellitus.  4. Headache.  5. Hypercholesterolemia.  6. Coronary artery disease.  7. Carotid artery occlusive disease.   PAST SURGICAL HISTORY:  1. Appendectomy.  2. Cholecystectomy.   MEDICATIONS:  Univasc, Nexium, Lipitor, Tricor, Glyset, and Valium.   ALLERGIES:  None.   FAMILY HISTORY:  Positive for coronary artery disease, hypertension, and  CVA.   PHYSICAL EXAMINATION:  VITAL SIGNS:  Temperature 98.4 degrees  Fahrenheit, blood pressure 163/109, pulse is 96, respirations 18.  HEAD, EYES, EARS, NOSE, AND THROAT:  Normal.  NECK:  No masses.  LUNGS:  Clear to auscultation.  HEART:  Regular rate and rhythm, no murmurs.  ABDOMEN:  Soft, no palpable flank, mass, or CVA tenderness.  The  bladder  is not palpable.  GENITALIA:  The penis circumcised, normal.  The testes are normal.  There is a localized swelling in the left hemiscrotal skin, which is  indurated and partially fluctuant.  Multiple puncta are noted over the  swelling.  The clinical features are suggestive of an infected sebaceous  cyst of the scrotum.  The size of the swelling is about 6 x 5 cm.  RECTAL:  Examination not done.   ADMISSION LABORATORY DATA:  CBC:  WBC 9.3, hemoglobin 15.6, hematocrit  44.5.  The rest of the labs are pending.   IMPRESSION:  1. Infected sebaceous cyst of the scrotum.  2. Diabetes mellitus.  3. Hypertension.  4. Coronary artery disease.   PLAN:  We will admit the patient to the hospital to IV fluids, p.o.  antibiotics with IV Gentamicin and Rocephin, Hospitalist consult for  management of his medical problems, incision and drainage of the  infected sebaceous cyst of the scrotum, in the a.m.  I have discussed  with the patient and his family regarding the diagnosis,  operative  details, alternative treatment, outcomes, possible risks and  complications, and he has agreed for the procedure to be done.      Dennie Maizes, M.D.  Electronically Signed     SK/MEDQ  D:  12/03/2006  T:  12/03/2006  Job:  [id number]

## 2010-07-17 NOTE — Op Note (Signed)
NAME:  ANSHUL, MEDDINGS NO.:  [id number]   MEDICAL RECORD NO.:  [id number]          PATIENT TYPE:  AMB   LOCATION:  DAY                           FACILITY:  [facility]   PHYSICIAN:  R. Roetta Sessions, M.D. DATE OF BIRTH:  [birth year]   DATE OF PROCEDURE:  02/12/2007  DATE OF DISCHARGE:                               OPERATIVE REPORT   DIAGNOSTIC COLONOSCOPY   INDICATIONS FOR PROCEDURE:  A 55 year old gentleman with a several year  history of intermittent low volume hematochezia.  He denies constipation  or diarrhea.  He does not strain.  There is a family history positive  for multiple second degree relatives with colon cancer although no first  degree relatives.  He has never had his lower GI tract evaluated.  Colonoscopy is now being done as a diagnostic maneuver.  This approach  has been discussed with the patient at length.  The potential risks,  benefits and alternatives have been reviewed, questions answered.  See  the documentation in the medical record.   PROCEDURE NOTE:  The O2 saturation, blood pressure, pulse, respirations  were monitored throughout the entire procedure.   CONSCIOUS SEDATION:  1. Versed 5 mg IV.  2. Demerol 125 mg IV in divided doses.   INSTRUMENT:  Pentax video chip system.   FINDINGS:  Digital rectal exam revealed no abnormalities.   ENDOSCOPIC FINDINGS:  The preparation was good.  Colon:  Colonic mucosa  was surveyed from the rectosigmoid junction through the left transverse,  right colon to the appendiceal orifice, ileocecal valve and cecum.  These structures were well seen and photographed for the record.  From  this level the scope was slowly withdrawn.  All previously mentioned  mucosal surfaces were again seen.  The colonic mucosa appeared normal.  The scope was pulled down in the rectum where a thorough examination of  rectal mucosa, including retroflexed view of the anal verge.  __________  demonstrated minimally friable anal  canal hemorrhoids; otherwise, the  rectal mucosa appeared entirely normal.  The patient tolerated the  procedure well, was reactive to endoscopy.   IMPRESSION:  1. Minimally friable anal canal hemorrhoids, otherwise normal rectum.  2. Normal colon.   RECOMMENDATIONS:  1. Daily fiber supplement recommended.  Hemorrhoid literature provided      to Mr. Lapierre.  2. A 10-day course of Anusol-HC suppositories, one per rectum at      bedtime.  3. Recommend return for a high risk screening colonoscopy in 5 years.      Jonathon Bellows, M.D.  Electronically Signed     RMR/MEDQ  D:  02/12/2007  T:  02/12/2007  Job:  [id number]   cc:   Dr. Vella Raring, NP

## 2010-07-18 LAB — HEPATIC FUNCTION PANEL
ALT: 34 U/L (ref 0–53)
AST: 26 U/L (ref 0–37)
Albumin: 4.3 g/dL (ref 3.5–5.2)
Alkaline Phosphatase: 86 U/L (ref 39–117)
Bilirubin, Direct: 0.1 mg/dL (ref 0.0–0.3)
Indirect Bilirubin: 0.2 mg/dL (ref 0.0–0.9)
Total Bilirubin: 0.3 mg/dL (ref 0.3–1.2)
Total Protein: 6.7 g/dL (ref 6.0–8.3)

## 2010-07-18 LAB — LIPID PANEL
Cholesterol: 144 mg/dL (ref 0–200)
HDL: 36 mg/dL — ABNORMAL LOW
LDL Cholesterol: 47 mg/dL (ref 0–99)
Total CHOL/HDL Ratio: 4 ratio
Triglycerides: 305 mg/dL — ABNORMAL HIGH
VLDL: 61 mg/dL — ABNORMAL HIGH (ref 0–40)

## 2010-08-06 ENCOUNTER — Other Ambulatory Visit: Payer: Self-pay

## 2010-08-06 MED ORDER — ATORVASTATIN CALCIUM 20 MG PO TABS: 20.0000 mg | ORAL_TABLET | Freq: Every day | ORAL | Status: DC

## 2010-11-13 ENCOUNTER — Telehealth: Payer: Self-pay | Admitting: Cardiology

## 2010-11-13 ENCOUNTER — Encounter: Payer: Self-pay | Admitting: Cardiology

## 2010-11-13 MED ORDER — LISINOPRIL-HYDROCHLOROTHIAZIDE 20-12.5 MG PO TABS: 1.0000 | ORAL_TABLET | Freq: Two times a day (BID) | ORAL | Status: DC

## 2010-11-13 NOTE — Telephone Encounter (Signed)
Requested refill sent

## 2010-11-13 NOTE — Telephone Encounter (Signed)
Patient needs refill on Lisinopril sent to [facility] / tg

## 2010-11-16 ENCOUNTER — Ambulatory Visit (INDEPENDENT_AMBULATORY_CARE_PROVIDER_SITE_OTHER): Payer: BC Managed Care – PPO | Admitting: Cardiology

## 2010-11-16 ENCOUNTER — Encounter: Payer: Self-pay | Admitting: Cardiology

## 2010-11-16 VITALS — BP 142/98 | HR 80 | Resp 18 | Ht 67.0 in | Wt 170.4 lb

## 2010-11-16 DIAGNOSIS — I6529 Occlusion and stenosis of unspecified carotid artery: Secondary | ICD-10-CM

## 2010-11-16 DIAGNOSIS — I2510 Atherosclerotic heart disease of native coronary artery without angina pectoris: Secondary | ICD-10-CM

## 2010-11-16 DIAGNOSIS — E782 Mixed hyperlipidemia: Secondary | ICD-10-CM

## 2010-11-16 DIAGNOSIS — R519 Headache, unspecified: Secondary | ICD-10-CM | POA: Insufficient documentation

## 2010-11-16 DIAGNOSIS — R51 Headache: Secondary | ICD-10-CM

## 2010-11-16 DIAGNOSIS — F17200 Nicotine dependence, unspecified, uncomplicated: Secondary | ICD-10-CM

## 2010-11-16 DIAGNOSIS — I10 Essential (primary) hypertension: Secondary | ICD-10-CM

## 2010-11-16 MED ORDER — SIMVASTATIN 20 MG PO TABS: 20.0000 mg | ORAL_TABLET | Freq: Every day | ORAL | Status: DC

## 2010-11-16 NOTE — Assessment & Plan Note (Signed)
We continue to discuss the importance of smoking cessation. 

## 2010-11-16 NOTE — Patient Instructions (Addendum)
Your physician has recommended you make the following change in your medication: Stop taking Lipitor and start taking Simvastatin 20 mg at bedtime and please take Fish oil 2000 mg (2 tablets) twice daily  Your physician recommends that you return for lab work in: 6 months, just before next visit  Your physician recommends that you see Dr. Judithann Sheen for your headaches, we will schedule  Your physician recommends that you schedule a follow-up appointment in: 6 months

## 2010-11-16 NOTE — Assessment & Plan Note (Signed)
Chronic, recurrent problem. Patient requesting referral for neurology evaluation. This will be arranged.

## 2010-11-16 NOTE — Assessment & Plan Note (Signed)
Plan to change from Lipitor to simvastatin 20 mg p.o. Q.h.s. Also continue omega-3 supplements, 4 g daily.

## 2010-11-16 NOTE — Assessment & Plan Note (Signed)
Nonobstructive by prior assessment with negative exercise echo in March of this year. For now would continue medical therapy and observation.

## 2010-11-16 NOTE — Progress Notes (Signed)
Clinical Summary Jimmy Duran is a 55 y.o.male presenting for followup. He was seen back in March.  He states that he ran out of his medications, was off them for at least a week until just recently. Elevated blood pressure reflects this. Also states he had some chest pain during this time. He reports that when he was taking his medicines as directed, blood pressure was well controlled.  Followup lab work in May showed total cholesterol 144 down from 254, triglycerides 305 down from 709, and LDL of 47. We reviewed these numbers today. He states he has been having trouble affording Lipitor. We discussed alternatives.  He also complains of headaches, states that he has had problems with chronic headaches for "20 years." He also reports anxiety and alluded to possible treatment for anxiety and headaches, perhaps referral for further assessment.  He continues to have trouble with smoking cessation. Today we discussed medication compliance, smoking cessation strategies.   No Known Allergies  Medication list reviewed.  Past Medical History  Diagnosis Date  . Coronary artery disease     Nonobstructive  . Type 2 diabetes mellitus   . Mixed hyperlipidemia   . Essential hypertension, benign   . Carotid artery disease   . COPD (chronic obstructive pulmonary disease)   . GERD (gastroesophageal reflux disease)   . Hemorrhoids     Past Surgical History  Procedure Date  . Appendectomy 1970  . Cholecystectomy 1993  . Umbilical hernia repair 1993  . Breast surgery     Cyst resection on the right    Family History  Problem Relation Age of Onset  . Coronary artery disease Father     CABG in his 13's    Social History Jimmy Duran reports that he has been smoking Cigarettes.  He has been smoking about 1 pack per day. He has never used smokeless tobacco. Jimmy Duran reports that he drinks alcohol.  Review of Systems As outlined above, otherwise negative.  Physical Examination Filed Vitals:     11/16/10 1520  BP: 142/98  Pulse: 80  Resp:    Normally nourished appearing male in no acute distress.  HEENT: Conjunctiva and lids normal, oropharynx with poor dentition.  Neck: Supple, no elevated JVP or significant carotid bruits, no thyromegaly.  Lungs: Diminished breath sounds throughout, no wheezing, nonlabored.  Cardiac: Regular rate and rhythm, no S3 or significant systolic murmur.  Abdomen: Soft, nontender, bowel sounds present.  Skin: Warm and dry.  Musculoskeletal: No kyphosis.  Extremities: No pitting edema, distal pulses 1-2+.  Neuropsychiatric: Alert and oriented x3, affect appropriate.    Studies Exercise echocardiogram 05/08/2010: Negative exercise treadmill test for ischemia by standard criteria at a maximum workload of 10.1 METS. No chest pain was reported. No inducible wall motion abnormalities to indicate ischemia.   Problem List and Plan

## 2010-11-16 NOTE — Assessment & Plan Note (Signed)
Not well-controlled today, complicated by medication noncompliance. I reinforced regular medication use, prescriptions recently filled.

## 2010-11-16 NOTE — Assessment & Plan Note (Signed)
For followup carotid Dopplers next March.

## 2010-11-26 ENCOUNTER — Ambulatory Visit: Payer: BC Managed Care – PPO | Admitting: Cardiology

## 2010-11-30 ENCOUNTER — Telehealth: Payer: Self-pay | Admitting: Cardiology

## 2010-11-30 NOTE — Telephone Encounter (Signed)
Just a FYI:  Patient was referred to Dr Judithann Sheen. He has a outstanding balance with them and will not be able to be seen until it has been paid.  Dr Sonda Rumble office is going to call patient.

## 2010-12-13 LAB — CBC
HCT: 44.5
Hemoglobin: 15.6
MCHC: 35
MCV: 94.3
Platelets: 319
RBC: 4.72
RDW: 12.9
WBC: 9.3

## 2010-12-13 LAB — BASIC METABOLIC PANEL
BUN: 5 — ABNORMAL LOW
BUN: 7
CO2: 28
CO2: 31
Calcium: 8.8
Calcium: 9.1
Chloride: 104
Chloride: 106
Creatinine, Ser: 0.79
Creatinine, Ser: 0.84
GFR calc Af Amer: >60
GFR calc Af Amer: >60
GFR calc non Af Amer: >60
GFR calc non Af Amer: >60
Glucose, Bld: 120 — ABNORMAL HIGH
Glucose, Bld: 136 — ABNORMAL HIGH
Potassium: 3.5
Potassium: 4
Sodium: 141
Sodium: 142

## 2010-12-13 LAB — HEMOGLOBIN A1C
Hgb A1c MFr Bld: 5.9
Mean Plasma Glucose: 133

## 2010-12-13 LAB — CULTURE, BLOOD (ROUTINE X 2)
Culture: NO GROWTH
Culture: NO GROWTH
Report Status: 10062008
Report Status: 10062008

## 2010-12-13 LAB — URINALYSIS, ROUTINE W REFLEX MICROSCOPIC
Bilirubin Urine: NEGATIVE
Glucose, UA: NEGATIVE
Ketones, ur: NEGATIVE
Leukocytes, UA: NEGATIVE
Nitrite: NEGATIVE
Protein, ur: NEGATIVE
Specific Gravity, Urine: <1.005 — ABNORMAL LOW
Urobilinogen, UA: 0.2
pH: 5.5

## 2010-12-13 LAB — CULTURE, ROUTINE-ABSCESS: Culture: NO GROWTH

## 2010-12-13 LAB — URINE CULTURE
Colony Count: NO GROWTH
Culture: NO GROWTH
Special Requests: NEGATIVE

## 2010-12-13 LAB — ANAEROBIC CULTURE

## 2010-12-13 LAB — DIFFERENTIAL
Basophils Absolute: 0.1
Basophils Relative: 1
Eosinophils Absolute: 0.3
Eosinophils Relative: 3
Lymphocytes Relative: 18
Lymphs Abs: 1.6
Monocytes Absolute: 0.7
Monocytes Relative: 8
Neutro Abs: 6.6
Neutrophils Relative %: 71

## 2010-12-13 LAB — URINE MICROSCOPIC-ADD ON

## 2010-12-13 LAB — PROTIME-INR
INR: 0.9
Prothrombin Time: 12.3

## 2010-12-13 LAB — APTT: aPTT: 30

## 2010-12-19 LAB — CREATININE, SERUM
Creatinine, Ser: 0.93
GFR calc Af Amer: >60
GFR calc non Af Amer: >60

## 2011-03-28 ENCOUNTER — Other Ambulatory Visit: Payer: Self-pay | Admitting: Cardiology

## 2011-04-25 ENCOUNTER — Other Ambulatory Visit: Payer: Self-pay | Admitting: *Deleted

## 2011-04-25 DIAGNOSIS — E782 Mixed hyperlipidemia: Secondary | ICD-10-CM

## 2011-05-03 ENCOUNTER — Encounter (HOSPITAL_COMMUNITY): Payer: Self-pay | Admitting: *Deleted

## 2011-05-03 ENCOUNTER — Other Ambulatory Visit: Payer: Self-pay | Admitting: Cardiology

## 2011-05-03 ENCOUNTER — Emergency Department (HOSPITAL_COMMUNITY): Payer: BC Managed Care – PPO

## 2011-05-03 ENCOUNTER — Emergency Department (HOSPITAL_COMMUNITY)
Admission: EM | Admit: 2011-05-03 | Discharge: 2011-05-03 | Disposition: A | Discharge status: Home / Self Care | Payer: BC Managed Care – PPO | Attending: Emergency Medicine | Admitting: Emergency Medicine

## 2011-05-03 DIAGNOSIS — J4489 Other specified chronic obstructive pulmonary disease: Secondary | ICD-10-CM | POA: Insufficient documentation

## 2011-05-03 DIAGNOSIS — M1710 Unilateral primary osteoarthritis, unspecified knee: Secondary | ICD-10-CM | POA: Insufficient documentation

## 2011-05-03 DIAGNOSIS — M25569 Pain in unspecified knee: Secondary | ICD-10-CM | POA: Insufficient documentation

## 2011-05-03 DIAGNOSIS — M1712 Unilateral primary osteoarthritis, left knee: Secondary | ICD-10-CM

## 2011-05-03 DIAGNOSIS — Z79899 Other long term (current) drug therapy: Secondary | ICD-10-CM | POA: Insufficient documentation

## 2011-05-03 DIAGNOSIS — R29898 Other symptoms and signs involving the musculoskeletal system: Secondary | ICD-10-CM | POA: Insufficient documentation

## 2011-05-03 DIAGNOSIS — Z7982 Long term (current) use of aspirin: Secondary | ICD-10-CM | POA: Insufficient documentation

## 2011-05-03 DIAGNOSIS — E119 Type 2 diabetes mellitus without complications: Secondary | ICD-10-CM | POA: Insufficient documentation

## 2011-05-03 DIAGNOSIS — I779 Disorder of arteries and arterioles, unspecified: Secondary | ICD-10-CM | POA: Insufficient documentation

## 2011-05-03 DIAGNOSIS — I2510 Atherosclerotic heart disease of native coronary artery without angina pectoris: Secondary | ICD-10-CM | POA: Insufficient documentation

## 2011-05-03 DIAGNOSIS — I10 Essential (primary) hypertension: Secondary | ICD-10-CM | POA: Insufficient documentation

## 2011-05-03 DIAGNOSIS — IMO0002 Reserved for concepts with insufficient information to code with codable children: Secondary | ICD-10-CM | POA: Insufficient documentation

## 2011-05-03 DIAGNOSIS — M25469 Effusion, unspecified knee: Secondary | ICD-10-CM | POA: Insufficient documentation

## 2011-05-03 DIAGNOSIS — J449 Chronic obstructive pulmonary disease, unspecified: Secondary | ICD-10-CM | POA: Insufficient documentation

## 2011-05-03 DIAGNOSIS — K219 Gastro-esophageal reflux disease without esophagitis: Secondary | ICD-10-CM | POA: Insufficient documentation

## 2011-05-03 DIAGNOSIS — E782 Mixed hyperlipidemia: Secondary | ICD-10-CM | POA: Insufficient documentation

## 2011-05-03 DIAGNOSIS — M2540 Effusion, unspecified joint: Secondary | ICD-10-CM

## 2011-05-03 MED ORDER — IBUPROFEN 800 MG PO TABS
800.0000 mg | ORAL_TABLET | Freq: Once | ORAL | Status: AC
  Administered 2011-05-03: 800 mg via ORAL
  Filled 2011-05-03: qty 1

## 2011-05-03 MED ORDER — ONDANSETRON HCL 4 MG PO TABS
4.0000 mg | ORAL_TABLET | Freq: Once | ORAL | Status: AC
  Administered 2011-05-03: 4 mg via ORAL
  Filled 2011-05-03: qty 1

## 2011-05-03 MED ORDER — IBUPROFEN 800 MG PO TABS: 800.0000 mg | ORAL_TABLET | Freq: Three times a day (TID) | ORAL | Status: AC

## 2011-05-03 MED ORDER — HYDROCODONE-ACETAMINOPHEN 5-325 MG PO TABS
2.0000 | ORAL_TABLET | Freq: Once | ORAL | Status: AC
  Administered 2011-05-03: 2 via ORAL
  Filled 2011-05-03: qty 2

## 2011-05-03 MED ORDER — HYDROCODONE-ACETAMINOPHEN 5-325 MG PO TABS: 1.0000 | ORAL_TABLET | ORAL | Status: DC | PRN

## 2011-05-03 NOTE — ED Provider Notes (Signed)
Medical screening examination/treatment/procedure(s) were performed by non-physician practitioner and as supervising physician I was immediately available for consultation/collaboration.   Joya Gaskins, MD 05/03/11 [phone]

## 2011-05-03 NOTE — ED Provider Notes (Signed)
History     CSN: [id number]  Arrival date & time 05/03/11  1644   First MD Initiated Contact with Patient 05/03/11 1656      Chief Complaint  Patient presents with  . Knee Pain    (Consider location/radiation/quality/duration/timing/severity/associated sxs/prior treatment) Patient is a 56 y.o. male presenting with knee pain. The history is provided by the patient.  Knee Pain This is a new problem. The current episode started 1 to 4 weeks ago. The problem occurs constantly. The problem has been gradually worsening. Associated symptoms include joint swelling. Pertinent negatives include no abdominal pain, arthralgias, chest pain, coughing or neck pain. The symptoms are aggravated by bending, standing and walking. He has tried NSAIDs for the symptoms. The treatment provided no relief.    Past Medical History  Diagnosis Date  . Coronary artery disease     Nonobstructive  . Type 2 diabetes mellitus   . Mixed hyperlipidemia   . Essential hypertension, benign   . Carotid artery disease   . COPD (chronic obstructive pulmonary disease)   . GERD (gastroesophageal reflux disease)   . Hemorrhoids   . Diabetes mellitus     Past Surgical History  Procedure Date  . Appendectomy 1970  . Cholecystectomy 1993  . Umbilical hernia repair 1993  . Breast surgery     Cyst resection on the right    Family History  Problem Relation Age of Onset  . Coronary artery disease Father     CABG in his 5's    History  Substance Use Topics  . Smoking status: Current Everyday Smoker -- 1.0 packs/day    Types: Cigarettes  . Smokeless tobacco: Never Used  . Alcohol Use: Yes     Socially      Review of Systems  Constitutional: Negative for activity change.       All ROS Neg except as noted in HPI  HENT: Negative for nosebleeds and neck pain.   Eyes: Negative for photophobia and discharge.  Respiratory: Negative for cough, shortness of breath and wheezing.   Cardiovascular: Negative for  chest pain and palpitations.  Gastrointestinal: Negative for abdominal pain and blood in stool.  Genitourinary: Negative for dysuria, frequency and hematuria.  Musculoskeletal: Positive for joint swelling. Negative for back pain and arthralgias.  Skin: Negative.   Neurological: Negative for dizziness, seizures and speech difficulty.  Psychiatric/Behavioral: Negative for hallucinations and confusion.    Allergies  Review of patient's allergies indicates no known allergies.  Home Medications   Current Outpatient Rx  Name Route Sig Dispense Refill  . ASPIRIN 81 MG PO TABS Oral Take 81 mg by mouth daily.      Marland Kitchen ESOMEPRAZOLE MAGNESIUM 40 MG PO CPDR Oral Take 40 mg by mouth. Take 2 tablets twice a day    . OMEGA-3 FATTY ACIDS 1000 MG PO CAPS Oral Take 2 g by mouth. Take 2 tablets twice a day    . LISINOPRIL-HYDROCHLOROTHIAZIDE 20-12.5 MG PO TABS  TAKE ONE TABLET BY MOUTH TWICE DAILY 60 tablet 6  . OMEPRAZOLE 20 MG PO CPDR Oral Take 20 mg by mouth daily.      Marland Kitchen PROPRANOLOL HCL 20 MG PO TABS Oral Take 20 mg by mouth 2 (two) times daily.      Marland Kitchen SIMVASTATIN 20 MG PO TABS Oral Take 1 tablet (20 mg total) by mouth at bedtime. 30 tablet 6    BP 147/95  Pulse 100  Temp(Src) 97.9 F (36.6 C) (Oral)  Resp 16  Ht  5\' 7"  (1.702 m)  Wt 180 lb (81.647 kg)  BMI 28.19 kg/m2  SpO2 98%  Physical Exam  Nursing note and vitals reviewed. Constitutional: He is oriented to person, place, and time. He appears well-developed and well-nourished.  Non-toxic appearance.  HENT:  Head: Normocephalic.  Right Ear: Tympanic membrane and external ear normal.  Left Ear: Tympanic membrane and external ear normal.  Eyes: EOM and lids are normal. Pupils are equal, round, and reactive to light.  Neck: Normal range of motion. Neck supple. Carotid bruit is not present.  Cardiovascular: Normal rate, regular rhythm, normal heart sounds, intact distal pulses and normal pulses.   Pulmonary/Chest: Breath sounds normal. No  respiratory distress.  Abdominal: Soft. Bowel sounds are normal. There is no tenderness. There is no guarding.  Musculoskeletal: Normal range of motion.       Lateral and medial pain to palpation of the left knee. No effusion. Mod crepitus noted. No posterior mass noted. Distal pulses wnl.  Lymphadenopathy:       Head (right side): No submandibular adenopathy present.       Head (left side): No submandibular adenopathy present.    He has no cervical adenopathy.  Neurological: He is alert and oriented to person, place, and time. He has normal strength. No cranial nerve deficit or sensory deficit.  Skin: Skin is warm and dry.  Psychiatric: He has a normal mood and affect. His speech is normal.    ED Course  Procedures (including critical care time) Pulse oximetry 98% on room air. Within normal limits by my interpretation. Labs Reviewed - No data to display No results found.   Dx: 1. Degenerative joint disease of the left knee. #2. Effusion left knee.   MDM  I have reviewed nursing notes, vital signs, and all appropriate lab and imaging results for this patient. There was a delay in obtaining the test results from radiology do to technical difficulties. This was explained to the patient.  The test results were reviewed with the patient including degenerative joint disease changes, (osteophytes), joint space narrowing, and effusion. The patient is fitted with a knee immobilizer. He is strongly advised to see orthopedics as soon as possible for formal evaluation of his multiple knee problems. Prescription for ibuprofen 800 mg 3 times daily, and Norco 5 mg every 4 hours as needed, given to the patient.       Kathie Dike, [place] 05/03/11 1935

## 2011-05-03 NOTE — ED Notes (Signed)
Lt knee pain for 3 weeks. No known injury

## 2011-05-03 NOTE — ED Notes (Signed)
Pt presents to ED secondary to left knee pain. Denies injury or trauma.  Minimal swelling noted proximal to patella. 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 Pt states pain increases with weight bearing.

## 2011-05-03 NOTE — ED Notes (Signed)
Pt states has knee immobilizer and crutches at home, will use the ones he currently owns as opposed to buying new. Pt verbalized understanding to call Dr Mort Sawyers office Monday AM for appointment.

## 2011-05-03 NOTE — Discharge Instructions (Signed)
Your x-ray and examination suggest degenerative joint disease of the knee and possible" internal arrangement of the knee". Please see the orthopedist listed above, or the orthopedist of your choice as soon as possible for formal evaluation of your knee. Please use the knee immobilizer when up and about until seen by the orthopedist. Ibuprofen 3 times daily with food. Norco every 4 hours as needed for pain, this medication may cause drowsiness, please use with caution.Degenerative Arthritis You have osteoarthritis. This is the wear and tear arthritis that comes with aging. It is also called degenerative arthritis. This is common in people past middle age. It is caused by stress on the joints. The large weight bearing joints of the lower extremities are most often affected. The knees, hips, back, neck, and hands can become painful, swollen, and stiff. This is the most common type of arthritis. It comes on with age, carrying too much weight, or from an injury. Treatment includes resting the sore joint until the pain and swelling improve. Crutches or a walker may be needed for severe flares. Only take over-the-counter or prescription medicines for pain, discomfort, or fever as directed by your caregiver. Local heat therapy may improve motion. Cortisone shots into the joint are sometimes used to reduce pain and swelling during flares. Osteoarthritis is usually not crippling and progresses slowly. There are things you can do to decrease pain:  Avoid high impact activities.   Exercise regularly.   Low impact exercises such as walking, biking and swimming help to keep the muscles strong and keep normal joint function.   Stretching helps to keep your range of motion.   Lose weight if you are overweight. This reduces joint stress.  In severe cases when you have pain at rest or increasing disability, joint surgery may be helpful. See your caregiver for follow-up treatment as recommended.  SEEK IMMEDIATE MEDICAL  CARE IF:   You have severe joint pain.   Marked swelling and redness in your joint develops.   You develop a high fever.  Document Released: 02/18/2005 Document Revised: 10/31/2010 Document Reviewed: 07/21/2006 [facility] Patient Information 2012 [place], [place].

## 2011-05-04 ENCOUNTER — Emergency Department (HOSPITAL_COMMUNITY): Payer: BC Managed Care – PPO

## 2011-05-04 ENCOUNTER — Encounter (HOSPITAL_COMMUNITY): Payer: Self-pay | Admitting: *Deleted

## 2011-05-04 ENCOUNTER — Emergency Department (HOSPITAL_COMMUNITY)
Admission: EM | Admit: 2011-05-04 | Discharge: 2011-05-04 | Disposition: A | Discharge status: Home / Self Care | Payer: BC Managed Care – PPO | Attending: Emergency Medicine | Admitting: Emergency Medicine

## 2011-05-04 DIAGNOSIS — Z7982 Long term (current) use of aspirin: Secondary | ICD-10-CM | POA: Insufficient documentation

## 2011-05-04 DIAGNOSIS — S6990XA Unspecified injury of unspecified wrist, hand and finger(s), initial encounter: Secondary | ICD-10-CM | POA: Insufficient documentation

## 2011-05-04 DIAGNOSIS — J4489 Other specified chronic obstructive pulmonary disease: Secondary | ICD-10-CM | POA: Insufficient documentation

## 2011-05-04 DIAGNOSIS — K219 Gastro-esophageal reflux disease without esophagitis: Secondary | ICD-10-CM | POA: Insufficient documentation

## 2011-05-04 DIAGNOSIS — E119 Type 2 diabetes mellitus without complications: Secondary | ICD-10-CM | POA: Insufficient documentation

## 2011-05-04 DIAGNOSIS — I779 Disorder of arteries and arterioles, unspecified: Secondary | ICD-10-CM | POA: Insufficient documentation

## 2011-05-04 DIAGNOSIS — S60459A Superficial foreign body of unspecified finger, initial encounter: Secondary | ICD-10-CM | POA: Insufficient documentation

## 2011-05-04 DIAGNOSIS — I2510 Atherosclerotic heart disease of native coronary artery without angina pectoris: Secondary | ICD-10-CM | POA: Insufficient documentation

## 2011-05-04 DIAGNOSIS — IMO0002 Reserved for concepts with insufficient information to code with codable children: Secondary | ICD-10-CM | POA: Insufficient documentation

## 2011-05-04 DIAGNOSIS — J449 Chronic obstructive pulmonary disease, unspecified: Secondary | ICD-10-CM | POA: Insufficient documentation

## 2011-05-04 DIAGNOSIS — S6980XA Other specified injuries of unspecified wrist, hand and finger(s), initial encounter: Secondary | ICD-10-CM | POA: Insufficient documentation

## 2011-05-04 DIAGNOSIS — I10 Essential (primary) hypertension: Secondary | ICD-10-CM | POA: Insufficient documentation

## 2011-05-04 DIAGNOSIS — E782 Mixed hyperlipidemia: Secondary | ICD-10-CM | POA: Insufficient documentation

## 2011-05-04 DIAGNOSIS — Z79899 Other long term (current) drug therapy: Secondary | ICD-10-CM | POA: Insufficient documentation

## 2011-05-04 LAB — HEPATIC FUNCTION PANEL
ALT: 23 U/L (ref 0–53)
AST: 21 U/L (ref 0–37)
Albumin: 4.5 g/dL (ref 3.5–5.2)
Alkaline Phosphatase: 89 U/L (ref 39–117)
Bilirubin, Direct: <0.1 mg/dL (ref 0.0–0.3)
Total Bilirubin: 0.2 mg/dL — ABNORMAL LOW (ref 0.3–1.2)
Total Protein: 6.7 g/dL (ref 6.0–8.3)

## 2011-05-04 LAB — LIPID PANEL
Cholesterol: 205 mg/dL — ABNORMAL HIGH (ref 0–200)
HDL: 36 mg/dL — ABNORMAL LOW (ref >39)
Total CHOL/HDL Ratio: 5.7 Ratio
Triglycerides: 645 mg/dL — ABNORMAL HIGH (ref <150)

## 2011-05-04 MED ORDER — LIDOCAINE HCL (PF) 1 % IJ SOLN
INTRAMUSCULAR | Status: AC
  Filled 2011-05-04: qty 5

## 2011-05-04 MED ORDER — DOXYCYCLINE HYCLATE 100 MG PO TABS
100.0000 mg | ORAL_TABLET | Freq: Once | ORAL | Status: AC
  Administered 2011-05-04: 100 mg via ORAL
  Filled 2011-05-04: qty 1

## 2011-05-04 MED ORDER — TETANUS-DIPHTH-ACELL PERTUSSIS 5-2.5-18.5 LF-MCG/0.5 IM SUSP
0.5000 mL | Freq: Once | INTRAMUSCULAR | Status: AC
  Administered 2011-05-04: 0.5 mL via INTRAMUSCULAR
  Filled 2011-05-04: qty 0.5

## 2011-05-04 MED ORDER — LIDOCAINE HCL (PF) 1 % IJ SOLN
INTRAMUSCULAR | Status: AC
  Administered 2011-05-04: 5 mL
  Filled 2011-05-04: qty 5

## 2011-05-04 MED ORDER — DOXYCYCLINE HYCLATE 100 MG PO CAPS: 100.0000 mg | ORAL_CAPSULE | Freq: Two times a day (BID) | ORAL | Status: AC

## 2011-05-04 NOTE — ED Notes (Signed)
Penetrating injury to left 4th and 5th finger.

## 2011-05-04 NOTE — Discharge Instructions (Signed)
Follow up with your md in two days for recheck.  Return if problems

## 2011-05-04 NOTE — ED Notes (Signed)
Pt has piece of wood stuck through the base of his 4 th and 5 th fingers. No bleeding noted at this time. Able to wiggle fingers without difficulty. Pt states that he pulled it out a quarter of an inch prior to arrival.

## 2011-05-04 NOTE — ED Notes (Signed)
Dressing applied to 4 th and 5 th fingers. Small amount bleeding noted.

## 2011-05-04 NOTE — ED Provider Notes (Cosign Needed)
History    Scribed for Jimmy Lennert, MD, the patient was seen in room APA07/APA07. This chart was scribed by Katha Cabal.  Pt was seen at 3:07 PM    CSN: [id number]  Arrival date & time 05/04/11  1306   First MD Initiated Contact with Patient 05/04/11 1506      Chief Complaint  Patient presents with  . Finger Injury    (Consider location/radiation/quality/duration/timing/severity/associated sxs/prior treatment) Patient is a 56 y.o. male presenting with hand injury. The history is provided by the patient. No language interpreter was used.  Hand Injury  Incident onset: just prior to arrival  The incident occurred at home. Injury mechanism: Wood penetrated throught fingers. The pain is present in the left fingers. The pain is mild. Possible foreign bodies include wood. The symptoms are aggravated by palpation. Treatments tried: Removal of wood with tweezers.      Patient states he was unloading wood from the back of his truck and wood slide through left hand.  Patient tried to remove piece of wood with tweezers PTA.   Past Medical History  Diagnosis Date  . Coronary artery disease     Nonobstructive  . Type 2 diabetes mellitus   . Mixed hyperlipidemia   . Essential hypertension, benign   . Carotid artery disease   . COPD (chronic obstructive pulmonary disease)   . GERD (gastroesophageal reflux disease)   . Hemorrhoids   . Diabetes mellitus     Past Surgical History  Procedure Date  . Appendectomy 1970  . Cholecystectomy 1993  . Umbilical hernia repair 1993  . Breast surgery     Cyst resection on the right    Family History  Problem Relation Age of Onset  . Coronary artery disease Father     CABG in his 46's    History  Substance Use Topics  . Smoking status: Current Everyday Smoker -- 1.0 packs/day    Types: Cigarettes  . Smokeless tobacco: Never Used  . Alcohol Use: Yes     Socially      Review of Systems  All other systems reviewed and are  negative.    Allergies  Review of patient's allergies indicates no known allergies.  Home Medications   Current Outpatient Rx  Name Route Sig Dispense Refill  . ASPIRIN EC 81 MG PO TBEC Oral Take 81 mg by mouth daily.    Marlin Canary HEADACHE PO Oral Take 1 packet by mouth as needed. For pain    . DOXYCYCLINE HYCLATE 100 MG PO CAPS Oral Take 1 capsule (100 mg total) by mouth 2 (two) times daily. 20 capsule 0  . OMEGA-3 FATTY ACIDS 1000 MG PO CAPS Oral Take 2 g by mouth 2 (two) times daily. Take 2 tablets twice a day    . HYDROCODONE-ACETAMINOPHEN 5-325 MG PO TABS Oral Take 1 tablet by mouth every 4 (four) hours as needed for pain. 20 tablet 0  . IBUPROFEN 800 MG PO TABS Oral Take 1 tablet (800 mg total) by mouth 3 (three) times daily. 21 tablet 0  . LISINOPRIL-HYDROCHLOROTHIAZIDE 20-12.5 MG PO TABS  TAKE ONE TABLET BY MOUTH TWICE DAILY 60 tablet 6  . SIMVASTATIN 20 MG PO TABS Oral Take 20 mg by mouth every morning.      BP 141/80  Pulse 101  Temp(Src) 98.2 F (36.8 C) (Oral)  Resp 20  Ht 5\' 7"  (1.702 m)  Wt 180 lb (81.647 kg)  BMI 28.19 kg/m2  SpO2 97%  Physical  Exam  Constitutional: He is oriented to person, place, and time. He appears well-developed.  HENT:  Head: Normocephalic.  Eyes: Conjunctivae are normal.  Neck: No tracheal deviation present.  Cardiovascular:  No murmur heard. Musculoskeletal: Normal range of motion.       1cm x 3 mm piece of wood between 4th and 5th fingers   Neurological: He is oriented to person, place, and time.  Skin: Skin is warm.  Psychiatric: He has a normal mood and affect.    ED Course  FOREIGN BODY REMOVAL Performed by: ,  L Authorized by: Bethann Berkshire L Comments: Pt had a piece of wood in his left 5th and 4th fingers.  Both fingers were numbed with lidocaine no epi.  The piece of wood was remove without problems using a hemostat and an incission was made with a number 11 blade   (including critical care  time)   DIAGNOSTIC STUDIES: Oxygen Saturation is 97% on room air, normal by my interpretation.     COORDINATION OF CARE: 3:10 PM  Physical exam complete.  Will remove wood.   3:57 PM  Wood removed from patient's hand.     LABS / RADIOLOGY:   Labs Reviewed - No data to display   Dg Hand Complete Left  05/04/2011  *RADIOLOGY REPORT*  Clinical Data: Finger injury  LEFT HAND - COMPLETE 3+ VIEW  Comparison: None.  Findings: No evidence of fracture or dislocation of the left hand. No soft tissue injury.  IMPRESSION: No fracture.  Original Report Authenticated By: Genevive Bi, M.D.         MDM             MEDICATIONS GIVEN IN THE E.D. Scheduled Meds:    . doxycycline  100 mg Oral Once  . lidocaine      . TDaP  0.5 mL Intramuscular Once  . DISCONTD: lidocaine       Continuous Infusions:      IMPRESSION: 1. Hand injury      NEW MEDICATIONS: New Prescriptions   DOXYCYCLINE (VIBRAMYCIN) 100 MG CAPSULE    Take 1 capsule (100 mg total) by mouth 2 (two) times daily.      The chart was scribed for me under my direct supervision.  I personally performed the history, physical, and medical decision making and all procedures in the evaluation of this patient.Jimmy Lennert, MD 05/04/11 [phone]

## 2011-05-07 ENCOUNTER — Ambulatory Visit (INDEPENDENT_AMBULATORY_CARE_PROVIDER_SITE_OTHER): Payer: BC Managed Care – PPO | Admitting: Cardiology

## 2011-05-07 ENCOUNTER — Encounter: Payer: Self-pay | Admitting: Cardiology

## 2011-05-07 VITALS — BP 114/77 | HR 96 | Resp 16 | Ht 67.0 in | Wt 171.0 lb

## 2011-05-07 DIAGNOSIS — I10 Essential (primary) hypertension: Secondary | ICD-10-CM

## 2011-05-07 DIAGNOSIS — I6529 Occlusion and stenosis of unspecified carotid artery: Secondary | ICD-10-CM

## 2011-05-07 DIAGNOSIS — I2510 Atherosclerotic heart disease of native coronary artery without angina pectoris: Secondary | ICD-10-CM

## 2011-05-07 DIAGNOSIS — E782 Mixed hyperlipidemia: Secondary | ICD-10-CM

## 2011-05-07 DIAGNOSIS — F17200 Nicotine dependence, unspecified, uncomplicated: Secondary | ICD-10-CM

## 2011-05-07 MED ORDER — SIMVASTATIN 40 MG PO TABS: 40.0000 mg | ORAL_TABLET | ORAL | Status: DC

## 2011-05-07 NOTE — Patient Instructions (Signed)
**Note De-Identified  Obfuscation** Your physician has recommended you make the following change in your medication: increase Simvastatin to 40 mg at bedtime and take 2 tablets of Fish oil twice daily  Your physician recommends that you return for lab work in: 6 months, just before next office visit.

## 2011-05-07 NOTE — Assessment & Plan Note (Signed)
Continue to work on smoking cessation. 

## 2011-05-07 NOTE — Assessment & Plan Note (Signed)
Can reassess carotids at followup.

## 2011-05-07 NOTE — Assessment & Plan Note (Signed)
No active angina on medical therapy. Continue observation for now. 

## 2011-05-07 NOTE — Assessment & Plan Note (Signed)
Blood pressure well-controlled today. 

## 2011-05-07 NOTE — Assessment & Plan Note (Signed)
Will increase simvastatin to 40 mg daily and advance omega-3 supplements to two pills BID. Followup FLP and LFT for next visit.

## 2011-05-07 NOTE — Progress Notes (Signed)
   Clinical Summary Jimmy Duran is a 56 y.o.male presenting for followup. He was seen in September 2012. He states that he feels generally well with the exception of left knee pain - pending orthopedic evaluation. No angina or progressive dyspnea.  Recent labwork showed cholesterol 205, triglycerides 645, HDL 36, LDL not calculated. No carotid studies as yet. We reviewed his medications today.  He states that he has been taking simvastatin regularly.  Continues to smoke. We have discussed smoking cessation over time.  No Known Allergies  Current Outpatient Prescriptions  Medication Sig Dispense Refill  . aspirin EC 81 MG tablet Take 81 mg by mouth daily.      . Aspirin-Acetaminophen-Caffeine (GOODY HEADACHE PO) Take 1 packet by mouth as needed. For pain      . Chlorpheniramine Maleate (ALLERGY PO) Take by mouth.      . doxycycline (VIBRAMYCIN) 100 MG capsule Take 1 capsule (100 mg total) by mouth 2 (two) times daily.  20 capsule  0  . fish oil-omega-3 fatty acids 1000 MG capsule Take 2 g by mouth 2 (two) times daily. Take 2 tablets twice a day      . ibuprofen (ADVIL,MOTRIN) 800 MG tablet Take 1 tablet (800 mg total) by mouth 3 (three) times daily.  21 tablet  0  . lisinopril-hydrochlorothiazide (PRINZIDE,ZESTORETIC) 20-12.5 MG per tablet TAKE ONE TABLET BY MOUTH TWICE DAILY  60 tablet  6  . ranitidine (ZANTAC) 150 MG capsule Take 150 mg by mouth every evening.      . simvastatin (ZOCOR) 20 MG tablet Take 20 mg by mouth every morning.        Past Medical History  Diagnosis Date  . Coronary artery disease     Nonobstructive  . Type 2 diabetes mellitus   . Mixed hyperlipidemia   . Essential hypertension, benign   . Carotid artery disease   . COPD (chronic obstructive pulmonary disease)   . GERD (gastroesophageal reflux disease)   . Hemorrhoids   . Diabetes mellitus     Social History Jimmy Duran reports that he has been smoking Cigarettes.  He has been smoking about 1 pack per  day. He has never used smokeless tobacco. Jimmy Duran reports that he drinks alcohol.  Review of Systems Negative except as outlined.  Physical Examination Filed Vitals:   05/07/11 1348  BP: 114/77  Pulse: 96  Resp: 16    Normally nourished appearing male in no acute distress.  HEENT: Conjunctiva and lids normal, oropharynx with poor dentition.  Neck: Supple, no elevated JVP or significant carotid bruits, no thyromegaly.  Lungs: Diminished breath sounds throughout, no wheezing, nonlabored.  Cardiac: Regular rate and rhythm, no S3 or significant systolic murmur.  Abdomen: Soft, nontender, bowel sounds present.  Skin: Warm and dry.  Musculoskeletal: No kyphosis.  Extremities: No pitting edema, distal pulses 1-2+.  Neuropsychiatric: Alert and oriented x3, affect appropriate.      Problem List and Plan

## 2011-05-22 ENCOUNTER — Ambulatory Visit (INDEPENDENT_AMBULATORY_CARE_PROVIDER_SITE_OTHER): Payer: BC Managed Care – PPO | Admitting: Orthopedic Surgery

## 2011-05-22 ENCOUNTER — Encounter: Payer: Self-pay | Admitting: Orthopedic Surgery

## 2011-05-22 VITALS — BP 110/60 | Ht 67.0 in | Wt 171.0 lb

## 2011-05-22 DIAGNOSIS — M2340 Loose body in knee, unspecified knee: Secondary | ICD-10-CM

## 2011-05-22 DIAGNOSIS — M23329 Other meniscus derangements, posterior horn of medial meniscus, unspecified knee: Secondary | ICD-10-CM

## 2011-05-22 MED ORDER — HYDROCODONE-ACETAMINOPHEN 5-325 MG PO TABS: 1.0000 | ORAL_TABLET | Freq: Four times a day (QID) | ORAL | Status: AC | PRN

## 2011-05-22 MED ORDER — DICLOFENAC POTASSIUM 50 MG PO TABS: 50.0000 mg | ORAL_TABLET | Freq: Two times a day (BID) | ORAL | Status: DC

## 2011-05-22 NOTE — Patient Instructions (Addendum)
Apply ice as needed  Pick up the other prescription from the pharmacy   Call the office if you decide to have surgery

## 2011-05-22 NOTE — Progress Notes (Signed)
  Subjective:    Jimmy Duran is a 56 y.o. male who presents sudden onset of LEFT knee pain without any trauma.  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  Pain started 6 weeks ago.  Describes the pain as sharp throbbing 8/10 constant pain with swelling catching and locking.  Walking and flexing the knee makes it worse at this point nothing is made better  He has a history shortness of breath and cough with snoring heartburn hematuria skin poor healing and seasonal ALLERGIES  Other systems reviewed were negative   The following portions of the patient's history were reviewed and updated as appropriate: allergies, current medications, past family history, past medical history, past social history, past surgical history and problem list.   Review of Systems Pertinent items are noted in HPI.   Objective:    BP 110/60  Ht 5\' 7"  (1.702 m)  Wt 171 lb (77.565 kg)  BMI 26.78 kg/m2  Vital signs are stable as recorded  General appearance is normal  The patient is alert and oriented x3  The patient's mood and affect are normal  The cardiovascular exam reveals normal pulses and temperature without edema swelling.  The lymphatic system is negative for palpable lymph nodes  The sensory exam is normal.  There are no pathologic reflexes.  Balance is normal.  Upper extremity exam  Inspection and palpation revealed no abnormalities in the upper extremities.  Range of motion is full without contracture.  Motor exam is normal with grade 5 strength.  The joints are fully reduced without subluxation.  There is no atrophy or tremor and muscle tone is normal.  All joints are stable.      Right knee: normal and no effusion, full active range of motion, no joint line tenderness, ligamentous structures intact.  Left knee:  Ambulation is unsupported except for braces and there is a small noticeable limp favoring the LEFT lower extremity.  He has a moderate joint  effusion.  Range of motion is normal.  Strength is normal.  Knee is stable.  There is significant and severe medial joint line tenderness and positive McMurray sign and a positive hyperextension test   X-ray left knee: shows DJD changes, likely chronic    Assessment:    Left Moderate meniscal injury on the left and loose body     Plan:    I advised the patient that he probably needed to have surgical arthroscopy removal of loose body and meniscal resection.  However he cannot do it at this time he opted for medication.  I explained the surgery to him and his postop course.  He will call us back to schedule surgery when it more convenient.

## 2011-05-29 ENCOUNTER — Telehealth: Payer: Self-pay | Admitting: Orthopedic Surgery

## 2011-05-29 ENCOUNTER — Ambulatory Visit: Payer: BC Managed Care – PPO | Admitting: Orthopedic Surgery

## 2011-05-29 ENCOUNTER — Encounter: Payer: Self-pay | Admitting: Orthopedic Surgery

## 2011-05-29 NOTE — Telephone Encounter (Signed)
Dr. Romeo Apple had called patient today to advise, cancel office visit appointment today, 05/29/11, as not necessary to come to office.  Patient aware surgery will be scheduled, given date of 06/14/11 per patient with Dr. Romeo Apple.    Patient stopped in today to request note for work; done and given to patient.  Also given his post operative appointment for 06/17/11, based on this surgery date.  Patient aware he will receive pre-op information when this is available.  His home ph# is [phone]; cell # [phone].

## 2011-05-30 ENCOUNTER — Other Ambulatory Visit: Payer: Self-pay | Admitting: *Deleted

## 2011-05-30 NOTE — Telephone Encounter (Signed)
Surgery 06/14/11 9:20am Pre op 06/07/11 1:45 Called patient, left message with wife to return call

## 2011-05-31 ENCOUNTER — Other Ambulatory Visit: Payer: Self-pay | Admitting: Cardiology

## 2011-06-04 ENCOUNTER — Other Ambulatory Visit: Payer: Self-pay

## 2011-06-04 MED ORDER — SIMVASTATIN 40 MG PO TABS: 40.0000 mg | ORAL_TABLET | ORAL | Status: DC

## 2011-06-06 ENCOUNTER — Encounter (HOSPITAL_COMMUNITY): Payer: Self-pay | Admitting: Pharmacy Technician

## 2011-06-07 ENCOUNTER — Other Ambulatory Visit: Payer: Self-pay

## 2011-06-07 ENCOUNTER — Telehealth: Payer: Self-pay | Admitting: Orthopedic Surgery

## 2011-06-07 ENCOUNTER — Encounter (HOSPITAL_COMMUNITY): Payer: Self-pay

## 2011-06-07 ENCOUNTER — Encounter (HOSPITAL_COMMUNITY)
Admission: RE | Admit: 2011-06-07 | Discharge: 2011-06-07 | Disposition: A | Discharge status: Home / Self Care | Payer: BC Managed Care – PPO | Source: Ambulatory Visit | Attending: Orthopedic Surgery | Admitting: Orthopedic Surgery

## 2011-06-07 LAB — BASIC METABOLIC PANEL
BUN: 21 mg/dL (ref 6–23)
CO2: 27 mEq/L (ref 19–32)
Calcium: 9.5 mg/dL (ref 8.4–10.5)
Chloride: 98 mEq/L (ref 96–112)
Creatinine, Ser: 0.93 mg/dL (ref 0.50–1.35)
GFR calc Af Amer: >90 mL/min (ref >90)
GFR calc non Af Amer: >90 mL/min (ref >90)
Glucose, Bld: 144 mg/dL — ABNORMAL HIGH (ref 70–99)
Potassium: 3.6 mEq/L (ref 3.5–5.1)
Sodium: 136 mEq/L (ref 135–145)

## 2011-06-07 LAB — SURGICAL PCR SCREEN
MRSA, PCR: NEGATIVE
Staphylococcus aureus: POSITIVE — AB

## 2011-06-07 LAB — CBC
HCT: 42.5 % (ref 39.0–52.0)
Hemoglobin: 14.6 g/dL (ref 13.0–17.0)
MCH: 32.4 pg (ref 26.0–34.0)
MCHC: 34.4 g/dL (ref 30.0–36.0)
MCV: 94.4 fL (ref 78.0–100.0)
Platelets: 292 10*3/uL (ref 150–400)
RBC: 4.5 MIL/uL (ref 4.22–5.81)
RDW: 12.1 % (ref 11.5–15.5)
WBC: 6.8 10*3/uL (ref 4.0–10.5)

## 2011-06-07 MED ORDER — CHLORHEXIDINE GLUCONATE 4 % EX LIQD
60.0000 mL | Freq: Once | CUTANEOUS | Status: DC
  Filled 2011-06-07: qty 60

## 2011-06-07 NOTE — Progress Notes (Signed)
06/07/11 1329  OBSTRUCTIVE SLEEP APNEA  Have you ever been diagnosed with sleep apnea through a sleep study? Yes  If yes, do you have and use a CPAP or BPAP machine every night? 0  Do you snore loudly (loud enough to be heard through closed doors)?  1  Do you often feel tired, fatigued, or sleepy during the daytime? 1  Has anyone observed you stop breathing during your sleep? 0  Do you have, or are you being treated for high blood pressure? 1  BMI more than 35 kg/m2? 0  Age over 62 years old? 1  Neck circumference greater than 40 cm/18 inches? 0 (16)  Gender: 1  Obstructive Sleep Apnea Score 5   Score 4 or greater  Updated health history

## 2011-06-07 NOTE — Patient Instructions (Addendum)
Arthroscopic Procedure, Alfonso Carden [facility]  06/07/2011   Your procedure is scheduled on:  06/14/2011  Report to [facility] at 0800 AM.  Call this number if you have problems the morning of surgery: [phone]   Remember:   Do not eat food:After Midnight.  May have clear liquids:until Midnight .  Clear liquids include soda, tea, black coffee, apple or grape juice, broth.  Take these medicines the morning of surgery with A SIP OF WATER: Norco, Zestorectic, Zantec   Do not wear jewelry, make-up or nail polish.  Do not wear lotions, powders, or perfumes. You may wear deodorant.  Do not shave 48 hours prior to surgery.  Do not bring valuables to the hospital.  Contacts, dentures or bridgework may not be worn into surgery.  Leave suitcase in the car. After surgery it may be brought to your room.  For patients admitted to the hospital, checkout time is 11:00 AM the day of discharge.   Patients discharged the day of surgery will not be allowed to drive home.  Name and phone number of your driver: WUJW;[phone]  Special Instructions: CHG Shower Use Special Wash: 1/2 bottle night before surgery and 1/2 bottle morning of surgery.   Please read over the following fact sheets that you were given: Pain Booklet, Coughing and Deep Breathing, MRSA Information and Surgical Site Infection Prevention

## 2011-06-07 NOTE — Pre-Procedure Instructions (Signed)
[address] [facility]  06/07/2011   Your procedure is scheduled on:  06/14/2011  Report to [name] at 0800 AM.  Call this number if you have problems the morning of surgery: [phone] will benefit from ongoing skilled PT services in home health setting to continue to advance safe functional   Do not eat food:After Midnight.  May have clear liquids:until Midnight .  Clear liquids include soda, tea, black coffee, apple or grape juice, broth.  Take these medicines the morning of surgery with A SIP OF WATER: Norco, Zestortic and Zantac   Do not wear jewelry, make-up or nail polish.  Do not wear lotions, powders, or perfumes. You may wear deodorant.  Do not shave 48 hours prior to surgery.  Do not bring valuables to the hospital.  Contacts, dentures or bridgework may not be worn into surgery.  Leave suitcase in the car. After surgery it may be brought to your room.  For patients admitted to the hospital, checkout time is 11:00 AM the day of discharge.   Patients discharged the day of surgery will not be allowed to drive home.  Name and phone number of your driver: Wife [phone]  Special Instructions: CHG Shower Use Special Wash: 1/2 bottle night before surgery and 1/2 bottle morning of surgery.   Please read over the following fact sheets that you were given: Pain Booklet

## 2011-06-07 NOTE — Telephone Encounter (Signed)
[address] insurer, [place], ph# [phone] re: out-patient surgery, CPT codes 78295, 29880, scheduled 06-26-2011 at [facility].  Per automated response system/Provider services: no pre-authorization required for out-patient surgery.  Confirmation Ref# [id number].

## 2011-06-12 NOTE — H&P (Signed)
  Subjective:   Jimmy Duran is a 56 y.o. male who presents sudden onset of LEFT knee pain without any trauma. 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 Pain started 6 weeks ago. Describes the pain as sharp throbbing 8/10 constant pain with swelling catching and locking. Walking and flexing the knee makes it worse at this point nothing is made better  He has a history shortness of breath and cough with snoring heartburn hematuria skin poor healing and seasonal ALLERGIES  Other systems reviewed were negative  The following portions of the patient's history were reviewed and updated as appropriate: allergies, current medications, past family history, past medical history, past social history, past surgical history and problem list.  Review of Systems  Pertinent items are noted in HPI.   Objective:   BP 110/60  Ht 5\' 7"  (1.702 m)  Wt 171 lb (77.565 kg)  BMI 26.78 kg/m2  Vital signs are stable as recorded  General appearance is normal  The patient is alert and oriented x3  The patient's mood and affect are normal  The cardiovascular exam reveals normal pulses and temperature without edema swelling.  The lymphatic system is negative for palpable lymph nodes  The sensory exam is normal.  There are no pathologic reflexes.  Balance is normal.  Upper extremity exam  Inspection and palpation revealed no abnormalities in the upper extremities. Range of motion is full without contracture.  Motor exam is normal with grade 5 strength.  The joints are fully reduced without subluxation.  There is no atrophy or tremor and muscle tone is normal. All joints are stable.    Right knee:  normal and no effusion, full active range of motion, no joint line tenderness, ligamentous structures intact.    Left knee:  Ambulation is unsupported except for braces and there is a small noticeable limp favoring the LEFT lower extremity. He has a moderate joint effusion. Range of motion is normal.  Strength is normal. Knee is stable. There is significant and severe medial joint line tenderness and positive McMurray sign and a positive hyperextension test    X-ray left knee: shows DJD changes, likely chronic     Assessment:     Left Moderate meniscal injury on the left  and loose body     Plan:     I advised the patient that he probably needed to have surgical arthroscopy removal of loose body and meniscal resection. However he cannot do it at this time he opted for medication. I explained the surgery to him and his postop course. He will call us back to schedule surgery when it more convenient.

## 2011-06-14 ENCOUNTER — Encounter (HOSPITAL_COMMUNITY): Payer: Self-pay | Admitting: Anesthesiology

## 2011-06-14 ENCOUNTER — Encounter (HOSPITAL_COMMUNITY)
Admission: RE | Disposition: A | Discharge status: Home / Self Care | Payer: Self-pay | Source: Ambulatory Visit | Attending: Orthopedic Surgery

## 2011-06-14 ENCOUNTER — Ambulatory Visit (HOSPITAL_COMMUNITY): Payer: BC Managed Care – PPO | Admitting: Anesthesiology

## 2011-06-14 ENCOUNTER — Encounter (HOSPITAL_COMMUNITY): Payer: Self-pay | Admitting: *Deleted

## 2011-06-14 ENCOUNTER — Ambulatory Visit (HOSPITAL_COMMUNITY)
Admission: RE | Admit: 2011-06-14 | Discharge: 2011-06-14 | Disposition: A | Discharge status: Home / Self Care | Payer: BC Managed Care – PPO | Source: Ambulatory Visit | Attending: Orthopedic Surgery | Admitting: Orthopedic Surgery

## 2011-06-14 DIAGNOSIS — J4489 Other specified chronic obstructive pulmonary disease: Secondary | ICD-10-CM | POA: Insufficient documentation

## 2011-06-14 DIAGNOSIS — I10 Essential (primary) hypertension: Secondary | ICD-10-CM | POA: Insufficient documentation

## 2011-06-14 DIAGNOSIS — J449 Chronic obstructive pulmonary disease, unspecified: Secondary | ICD-10-CM | POA: Insufficient documentation

## 2011-06-14 DIAGNOSIS — M23305 Other meniscus derangements, unspecified medial meniscus, unspecified knee: Secondary | ICD-10-CM | POA: Insufficient documentation

## 2011-06-14 DIAGNOSIS — M949 Disorder of cartilage, unspecified: Secondary | ICD-10-CM | POA: Insufficient documentation

## 2011-06-14 DIAGNOSIS — Z01812 Encounter for preprocedural laboratory examination: Secondary | ICD-10-CM | POA: Insufficient documentation

## 2011-06-14 DIAGNOSIS — M23329 Other meniscus derangements, posterior horn of medial meniscus, unspecified knee: Secondary | ICD-10-CM

## 2011-06-14 DIAGNOSIS — E119 Type 2 diabetes mellitus without complications: Secondary | ICD-10-CM | POA: Insufficient documentation

## 2011-06-14 DIAGNOSIS — M1710 Unilateral primary osteoarthritis, unspecified knee: Secondary | ICD-10-CM

## 2011-06-14 DIAGNOSIS — Z79899 Other long term (current) drug therapy: Secondary | ICD-10-CM | POA: Insufficient documentation

## 2011-06-14 DIAGNOSIS — M899 Disorder of bone, unspecified: Secondary | ICD-10-CM | POA: Insufficient documentation

## 2011-06-14 LAB — GLUCOSE, CAPILLARY: Glucose-Capillary: 145 mg/dL — ABNORMAL HIGH (ref 70–99)

## 2011-06-14 SURGERY — ARTHROSCOPY, KNEE, WITH MEDIAL MENISCECTOMY
Anesthesia: Spinal | Site: Knee | Laterality: Left | Wound class: Clean

## 2011-06-14 MED ORDER — ONDANSETRON HCL 4 MG/2ML IJ SOLN
INTRAMUSCULAR | Status: AC
  Administered 2011-06-14: 4 mg via INTRAVENOUS
  Filled 2011-06-14: qty 2

## 2011-06-14 MED ORDER — FENTANYL CITRATE 0.05 MG/ML IJ SOLN
INTRAMUSCULAR | Status: AC
  Filled 2011-06-14: qty 2

## 2011-06-14 MED ORDER — ONDANSETRON HCL 4 MG/2ML IJ SOLN
4.0000 mg | Freq: Once | INTRAMUSCULAR | Status: AC
  Administered 2011-06-14: 4 mg via INTRAVENOUS

## 2011-06-14 MED ORDER — SODIUM CHLORIDE 0.9 % IR SOLN
Status: DC | PRN
  Administered 2011-06-14 (×4)

## 2011-06-14 MED ORDER — FENTANYL CITRATE 0.05 MG/ML IJ SOLN: 25.0000 ug | INTRAMUSCULAR | Status: DC | PRN

## 2011-06-14 MED ORDER — ACETAMINOPHEN 10 MG/ML IV SOLN
1000.0000 mg | Freq: Once | INTRAVENOUS | Status: AC
  Administered 2011-06-14: 1000 mg via INTRAVENOUS

## 2011-06-14 MED ORDER — CELECOXIB 100 MG PO CAPS
400.0000 mg | ORAL_CAPSULE | Freq: Once | ORAL | Status: AC
  Administered 2011-06-14: 400 mg via ORAL

## 2011-06-14 MED ORDER — ONDANSETRON HCL 4 MG/2ML IJ SOLN: 4.0000 mg | Freq: Once | INTRAMUSCULAR | Status: DC | PRN

## 2011-06-14 MED ORDER — HYDROCODONE-ACETAMINOPHEN 10-325 MG PO TABS
1.0000 | ORAL_TABLET | ORAL | Status: AC | PRN
Start: 2011-06-14 — End: 2011-06-24

## 2011-06-14 MED ORDER — CEFAZOLIN SODIUM 1-5 GM-% IV SOLN
INTRAVENOUS | Status: AC
  Filled 2011-06-14: qty 50

## 2011-06-14 MED ORDER — LIDOCAINE HCL (PF) 1 % IJ SOLN
INTRAMUSCULAR | Status: AC
  Filled 2011-06-14: qty 5

## 2011-06-14 MED ORDER — ACETAMINOPHEN 10 MG/ML IV SOLN
INTRAVENOUS | Status: AC
  Administered 2011-06-14: 1000 mg via INTRAVENOUS
  Filled 2011-06-14: qty 100

## 2011-06-14 MED ORDER — GLYCOPYRROLATE 0.2 MG/ML IJ SOLN
INTRAMUSCULAR | Status: AC
  Administered 2011-06-14: 0.2 mg via INTRAVENOUS
  Filled 2011-06-14: qty 1

## 2011-06-14 MED ORDER — CELECOXIB 100 MG PO CAPS
ORAL_CAPSULE | ORAL | Status: AC
  Administered 2011-06-14: 400 mg via ORAL
  Filled 2011-06-14: qty 4

## 2011-06-14 MED ORDER — LACTATED RINGERS IV SOLN
INTRAVENOUS | Status: DC
  Administered 2011-06-14: 1000 mL via INTRAVENOUS

## 2011-06-14 MED ORDER — PROPOFOL 10 MG/ML IV EMUL
INTRAVENOUS | Status: AC
  Filled 2011-06-14: qty 20

## 2011-06-14 MED ORDER — OXYCODONE HCL 5 MG PO TABS: 5.0000 mg | ORAL_TABLET | ORAL | Status: DC

## 2011-06-14 MED ORDER — MIDAZOLAM HCL 2 MG/2ML IJ SOLN
1.0000 mg | INTRAMUSCULAR | Status: DC | PRN
  Administered 2011-06-14: 1 mg via INTRAVENOUS

## 2011-06-14 MED ORDER — CEFAZOLIN SODIUM 1-5 GM-% IV SOLN
1.0000 g | INTRAVENOUS | Status: AC
  Administered 2011-06-14: 1 g via INTRAVENOUS

## 2011-06-14 MED ORDER — EPHEDRINE SULFATE 50 MG/ML IJ SOLN
INTRAMUSCULAR | Status: DC | PRN
  Administered 2011-06-14: 5 mg via INTRAVENOUS

## 2011-06-14 MED ORDER — FENTANYL CITRATE 0.05 MG/ML IJ SOLN
INTRAMUSCULAR | Status: DC | PRN
Start: 2011-06-14 — End: 2011-06-14
  Administered 2011-06-14: 25 ug via INTRAVENOUS
  Administered 2011-06-14: 12.5 ug via INTRAVENOUS
  Administered 2011-06-14 (×2): 25 ug via INTRAVENOUS

## 2011-06-14 MED ORDER — ACETAMINOPHEN 325 MG PO TABS: 325.0000 mg | ORAL_TABLET | ORAL | Status: DC | PRN

## 2011-06-14 MED ORDER — OXYCODONE HCL 5 MG PO TABS
ORAL_TABLET | ORAL | Status: AC
  Administered 2011-06-14: 5 mg
  Filled 2011-06-14: qty 1

## 2011-06-14 MED ORDER — PREGABALIN 50 MG PO CAPS
50.0000 mg | ORAL_CAPSULE | Freq: Three times a day (TID) | ORAL | Status: DC
  Administered 2011-06-14: 50 mg via ORAL

## 2011-06-14 MED ORDER — PREGABALIN 50 MG PO CAPS
ORAL_CAPSULE | ORAL | Status: AC
  Administered 2011-06-14: 50 mg via ORAL
  Filled 2011-06-14: qty 1

## 2011-06-14 MED ORDER — FENTANYL CITRATE 0.05 MG/ML IJ SOLN
INTRAMUSCULAR | Status: DC | PRN
  Administered 2011-06-14: 12.5 ug via INTRATHECAL

## 2011-06-14 MED ORDER — BUPIVACAINE IN DEXTROSE 0.75-8.25 % IT SOLN
INTRATHECAL | Status: AC
  Filled 2011-06-14: qty 2

## 2011-06-14 MED ORDER — PROPOFOL 10 MG/ML IV EMUL
INTRAVENOUS | Status: DC | PRN
  Administered 2011-06-14: 15 ug/kg/min via INTRAVENOUS
  Administered 2011-06-14: 100 ug/kg/min via INTRAVENOUS

## 2011-06-14 MED ORDER — BUPIVACAINE-EPINEPHRINE PF 0.5-1:200000 % IJ SOLN
INTRAMUSCULAR | Status: DC | PRN
  Administered 2011-06-14: 60 mL

## 2011-06-14 MED ORDER — GLYCOPYRROLATE 0.2 MG/ML IJ SOLN
0.2000 mg | Freq: Once | INTRAMUSCULAR | Status: AC
  Administered 2011-06-14: 0.2 mg via INTRAVENOUS

## 2011-06-14 MED ORDER — BUPIVACAINE IN DEXTROSE 0.75-8.25 % IT SOLN
INTRATHECAL | Status: DC | PRN
  Administered 2011-06-14: 11.25 mg via INTRATHECAL

## 2011-06-14 MED ORDER — MIDAZOLAM HCL 2 MG/2ML IJ SOLN
INTRAMUSCULAR | Status: AC
  Administered 2011-06-14: 1 mg via INTRAVENOUS
  Filled 2011-06-14: qty 2

## 2011-06-14 SURGICAL SUPPLY — 54 items
ARTHROWAND PARAGON T2 (SURGICAL WAND)
BAG HAMPER (MISCELLANEOUS) ×2 IMPLANT
BANDAGE ELASTIC 6 VELCRO NS (GAUZE/BANDAGES/DRESSINGS) ×2 IMPLANT
BLADE AGGRESSIVE PLUS 4.0 (BLADE) ×2 IMPLANT
BLADE SURG SZ11 CARB STEEL (BLADE) ×2 IMPLANT
CHLORAPREP W/TINT 26ML (MISCELLANEOUS) ×4 IMPLANT
CLOTH BEACON ORANGE TIMEOUT ST (SAFETY) ×2 IMPLANT
COOLER CRYO IC GRAV AND TUBE (ORTHOPEDIC SUPPLIES) ×2 IMPLANT
CUFF CRYO KNEE LG 20X31 COOLER (ORTHOPEDIC SUPPLIES) IMPLANT
CUFF CRYO KNEE18X23 MED (MISCELLANEOUS) ×1 IMPLANT
CUFF TOURNIQUET SINGLE 34IN LL (TOURNIQUET CUFF) ×1 IMPLANT
CUFF TOURNIQUET SINGLE 44IN (TOURNIQUET CUFF) IMPLANT
CUTTER ANGLED DBL BITE 4.5 (BURR) IMPLANT
DECANTER SPIKE VIAL GLASS SM (MISCELLANEOUS) ×4 IMPLANT
DRAPE PROXIMA HALF (DRAPES) ×1 IMPLANT
FLOOR PAD 36X40 (MISCELLANEOUS) ×2
GAUZE SPONGE 4X4 16PLY XRAY LF (GAUZE/BANDAGES/DRESSINGS) ×2 IMPLANT
GAUZE XEROFORM 5X9 LF (GAUZE/BANDAGES/DRESSINGS) ×2 IMPLANT
GLOVE SKINSENSE NS SZ8.0 LF (GLOVE) ×1
GLOVE SKINSENSE STRL SZ8.0 LF (GLOVE) ×1 IMPLANT
GLOVE SS N UNI LF 8.5 STRL (GLOVE) ×2 IMPLANT
GOWN STRL REIN XL XLG (GOWN DISPOSABLE) ×5 IMPLANT
HLDR LEG FOAM (MISCELLANEOUS) ×1 IMPLANT
IV NS IRRIG 3000ML ARTHROMATIC (IV SOLUTION) ×6 IMPLANT
KIT BLADEGUARD II DBL (SET/KITS/TRAYS/PACK) ×2 IMPLANT
KIT ROOM TURNOVER AP CYSTO (KITS) ×2 IMPLANT
LEG HOLDER FOAM (MISCELLANEOUS) ×1
MANIFOLD NEPTUNE II (INSTRUMENTS) ×2 IMPLANT
MARKER SKIN DUAL TIP RULER LAB (MISCELLANEOUS) ×2 IMPLANT
NDL HYPO 18GX1.5 BLUNT FILL (NEEDLE) ×1 IMPLANT
NDL HYPO 21X1.5 SAFETY (NEEDLE) ×1 IMPLANT
NDL SPNL 18GX3.5 QUINCKE PK (NEEDLE) ×1 IMPLANT
NEEDLE HYPO 18GX1.5 BLUNT FILL (NEEDLE) ×2 IMPLANT
NEEDLE HYPO 21X1.5 SAFETY (NEEDLE) ×2 IMPLANT
NEEDLE SPNL 18GX3.5 QUINCKE PK (NEEDLE) ×2 IMPLANT
NS IRRIG 1000ML POUR BTL (IV SOLUTION) ×2 IMPLANT
PACK ARTHRO LIMB DRAPE STRL (MISCELLANEOUS) ×2 IMPLANT
PAD ABD 5X9 TENDERSORB (GAUZE/BANDAGES/DRESSINGS) ×2 IMPLANT
PAD ARMBOARD 7.5X6 YLW CONV (MISCELLANEOUS) ×2 IMPLANT
PAD FLOOR 36X40 (MISCELLANEOUS) ×1 IMPLANT
PADDING CAST COTTON 6X4 STRL (CAST SUPPLIES) ×2 IMPLANT
PIN STMN 9X.142 IN (PIN) ×1 IMPLANT
SET ARTHROSCOPY INST (INSTRUMENTS) ×2 IMPLANT
SET ARTHROSCOPY PUMP TUBE (IRRIGATION / IRRIGATOR) ×2 IMPLANT
SET BASIN LINEN APH (SET/KITS/TRAYS/PACK) ×2 IMPLANT
SPONGE GAUZE 4X4 12PLY (GAUZE/BANDAGES/DRESSINGS) ×2 IMPLANT
STRIP CLOSURE SKIN 1/2X4 (GAUZE/BANDAGES/DRESSINGS) ×2 IMPLANT
SUT ETHILON 3 0 FSL (SUTURE) ×1 IMPLANT
SYR 30ML LL (SYRINGE) ×2 IMPLANT
SYRINGE 10CC LL (SYRINGE) ×2 IMPLANT
WAND 50 DEG COVAC W/CORD (SURGICAL WAND) ×1 IMPLANT
WAND 90 DEG TURBOVAC W/CORD (SURGICAL WAND) IMPLANT
WAND ARTHRO PARAGON T2 (SURGICAL WAND) IMPLANT
YANKAUER SUCT BULB TIP 10FT TU (MISCELLANEOUS) ×7 IMPLANT

## 2011-06-14 NOTE — Anesthesia Procedure Notes (Addendum)
Procedure Name: MAC Date/Time: 06/14/2011 10:40 AM Performed by: Franco Nones Pre-anesthesia Checklist: Patient identified, Emergency Drugs available, Suction available, Timeout performed and Patient being monitored Patient Re-evaluated:Patient Re-evaluated prior to inductionOxygen Delivery Method: Non-rebreather mask    Spinal  Start time: 06/14/2011 10:50 AM End time: 06/14/2011 11:15 AM Staffing Anesthesiologist: Roselie Awkward CRNA/Resident: Franco Nones Preanesthetic Checklist Completed: patient identified, site marked, surgical consent, pre-op evaluation, timeout performed, IV checked, risks and benefits discussed and monitors and equipment checked Spinal Block Patient position: left lateral decubitus Prep: Betadine and prep x 3 Patient monitoring: heart rate, cardiac monitor, continuous pulse ox and blood pressure Approach: left paramedian Location: L3-4 Injection technique: single-shot Needle Needle type: Spinocan  Needle gauge: 22 G Needle length: 9 cm Assessment Sensory level: T8 (level at 1125) Events: positive heme repositioned ny Dr. Tollie Eth and clear CSF pre  and post injection Additional Notes  Marcaine 11.25 mg/ Fentanyl 12.5 mcg   Tray [id number] Expires 2013-11 3 attempts made L4-5 para median by T.  CRNA without success Dr Tollie Eth in and successful at L3-4

## 2011-06-14 NOTE — Op Note (Signed)
06/14/2011  12:20 PM  PATIENT:  Jimmy Duran  56 y.o. male  PRE-OPERATIVE DIAGNOSIS:  left meniscal tear  POST-OPERATIVE DIAGNOSIS:  left meniscal tear Grade 4 chondral lesion medial femoral condyle  PROCEDURE:  Procedure(s) (LRB): KNEE ARTHROSCOPY WITH MEDIAL MENISECTOMY (Left), microfracture MFC  Findings: Medial compartment, grade 4 chondral lesion medial femoral compartment measuring 12 x 9 mm. Grade 4 chondral lesion tibial plateau measuring 3 x 3 mm. Posterior horn degenerative meniscal tear. Notch area normal Lateral compartment, normal Patellofemoral joint, normal  Details of procedure  The patient was identified in the preop area and the left knee was marked as a surgical site confirmed and countersigned followed by review of the medical record and chart update. The patient was taken to the operating room where spinal anesthetic was administered and he was placed on the operating table in the supine position. After sterile prep and drape UR 10 completed and agreed on the timeout. A lateral portal was established the scope was placed into the joint and a diagnostic arthroscopy was done.  A medial portal was established and a probe was placed in the medial portal into the medial joint. The medial hemi-joint was noted to have chondral lesion of the medial femoral condyle which was debrided with a shaver after debridement there was a grade 4 lesion 12 x 9 mm. The posterior horn of the medial meniscus was deficient and degenerative and torn. This was treated with a synovial resector as well as an Oncologist. After stable rim was confirmed by visualization and palpation the posterior root of the meniscus was evaluated and a probe and a suction device was placed to pull out any residual meniscal tissue  We then turned our attention to the chondral lesion which was debrided found to be a grade 4 lesion and then was treated with microfracture using a chondral pick and a Steinmann  pin.  The anterior cruciate ligament and PCL were intact the lateral meniscus was normal the lateral cartilage was normal.  The patellofemoral joint was normal.  The joint was irrigated. The suction was turned on to removing the residual meniscal or cartilaginous fragments.  The portal sites were closed with 3-0 nylon suture. The joint was injected with 60 cc of Marcaine with epinephrine.  A sterile bandage was applied.  The Cryo/Cuff was placed and activated  Postoperative plan is for the patient be weightbearing as tolerated in the brace with protected weightbearing. He'll start physical therapy next week  SURGEON:  Surgeon(s) and Role:    * Vickki Hearing, MD - Primary  PHYSICIAN ASSISTANT:   ASSISTANTS: none   ANESTHESIA:   spinal  EBL:  Total I/O In: 600 [I.V.:600] Out: 0   BLOOD ADMINISTERED:none  DRAINS: none   LOCAL MEDICATIONS USED:  MARCAINE   With epi 60 cc   SPECIMEN:  No Specimen  DISPOSITION OF SPECIMEN:  N/A  COUNTS:  YES  TOURNIQUET:  * Missing tourniquet times found for documented tourniquets in log:  62952 *  DICTATION: .Dragon Dictation  PLAN OF CARE: discharge   PATIENT DISPOSITION:  PACU - hemodynamically stable.   Delay start of Pharmacological VTE agent (>24hrs) due to surgical blood loss or risk of bleeding: not applicable

## 2011-06-14 NOTE — Interval H&P Note (Signed)
History and Physical Interval Note:  06/14/2011 10:27 AM  Jimmy Duran  has presented today for surgery, with the diagnosis of left meniscal tear  The various methods of treatment have been discussed with the patient and family. After consideration of risks, benefits and other options for treatment, the patient has consented to  Procedure(s) (LRB): KNEE ARTHROSCOPY WITH MEDIAL MENISECTOMY (Left) as a surgical intervention .  The patients' history has been reviewed, patient examined, no change in status, stable for surgery.  I have reviewed the patients' chart and labs.  Questions were answered to the patient's satisfaction.     [name]

## 2011-06-14 NOTE — Transfer of Care (Signed)
Immediate Anesthesia Transfer of Care Note  Patient: Jimmy Duran [facility]  Procedure(s) Performed: Procedure(s) (LRB): KNEE ARTHROSCOPY WITH MEDIAL MENISECTOMY (Left)  Patient Location: PACU  Anesthesia Type: SAB  Level of Consciousness: awake  Airway & Oxygen Therapy: Patient Spontanous Breathing and non-rebreather face mask  Post-op Assessment: Report given to PACU RN, Post -op Vital signs reviewed and stable. SAB Level  T 8-10  Post vital signs: Reviewed and stable  Complications: No apparent anesthesia complications

## 2011-06-14 NOTE — Anesthesia Postprocedure Evaluation (Signed)
Anesthesia Post Note  Patient: Jimmy Duran [facility]  Procedure(s) Performed: Procedure(s) (LRB): KNEE ARTHROSCOPY WITH MEDIAL MENISECTOMY (Left)  Anesthesia type: Spinal  Patient location: PACU  Post pain: Pain level controlled  Post assessment: Post-op Vital signs reviewed, Patient's Cardiovascular Status Stable, Respiratory Function Stable, Patent Airway, No signs of Nausea or vomiting and Pain level controlled  Last Vitals:  Filed Vitals:   06/14/11 1225  BP: 99/68  Pulse: 88  Temp: 36.5 C  Resp: 14    Post vital signs: Reviewed and stable  Level of consciousness: awake and alert   Complications: No apparent anesthesia complications

## 2011-06-14 NOTE — Brief Op Note (Signed)
06/14/2011  12:20 PM  PATIENT:  Jimmy Duran  56 y.o. male  PRE-OPERATIVE DIAGNOSIS:  left meniscal tear  POST-OPERATIVE DIAGNOSIS:  left meniscal tear Grade 4 chondral lesion medial femoral condyle  PROCEDURE:  Procedure(s) (LRB): KNEE ARTHROSCOPY WITH MEDIAL MENISECTOMY (Left), microfracture MFC  SURGEON:  Surgeon(s) and Role:    * Vickki Hearing, MD - Primary  PHYSICIAN ASSISTANT:   ASSISTANTS: none   ANESTHESIA:   spinal  EBL:  Total I/O In: 600 [I.V.:600] Out: 0   BLOOD ADMINISTERED:none  DRAINS: none   LOCAL MEDICATIONS USED:  MARCAINE   With epi 60 cc   SPECIMEN:  No Specimen  DISPOSITION OF SPECIMEN:  N/A  COUNTS:  YES  TOURNIQUET:  * Missing tourniquet times found for documented tourniquets in log:  78469 *  DICTATION: .Dragon Dictation  PLAN OF CARE: discharge   PATIENT DISPOSITION:  PACU - hemodynamically stable.   Delay start of Pharmacological VTE agent (>24hrs) due to surgical blood loss or risk of bleeding: not applicable

## 2011-06-14 NOTE — Anesthesia Preprocedure Evaluation (Addendum)
Anesthesia Evaluation  Patient identified by MRN, date of birth, ID band Patient awake    Reviewed: Allergy & Precautions, H&P , NPO status , Patient's Chart, lab work & pertinent test results  Airway Mallampati: II TM Distance: >3 FB Neck ROM: Full    Dental  (+) Edentulous Upper and Missing   Pulmonary COPDCurrent Smoker,    Pulmonary exam normal       Cardiovascular hypertension, Pt. on medications + CAD Rhythm:Regular Rate:Normal     Neuro/Psych  Headaches, negative psych ROS   GI/Hepatic GERD-  Medicated,  Endo/Other  Diabetes mellitus- (Diet controlled), Well Controlled, Type 2  Renal/GU      Musculoskeletal   Abdominal Normal abdominal exam  (+)   Peds  Hematology   Anesthesia Other Findings   Reproductive/Obstetrics                          Anesthesia Physical Anesthesia Plan  ASA: III  Anesthesia Plan: Spinal   Post-op Pain Management:    Induction: Intravenous  Airway Management Planned: Nasal Cannula  Additional Equipment:   Intra-op Plan:   Post-operative Plan:   Informed Consent: I have reviewed the patients History and Physical, chart, labs and discussed the procedure including the risks, benefits and alternatives for the proposed anesthesia with the patient or authorized representative who has indicated his/her understanding and acceptance.     Plan Discussed with: CRNA  Anesthesia Plan Comments:         Anesthesia Quick Evaluation

## 2011-06-17 ENCOUNTER — Encounter: Payer: Self-pay | Admitting: Orthopedic Surgery

## 2011-06-17 ENCOUNTER — Ambulatory Visit (INDEPENDENT_AMBULATORY_CARE_PROVIDER_SITE_OTHER): Payer: BC Managed Care – PPO | Admitting: Orthopedic Surgery

## 2011-06-17 VITALS — BP 128/70 | Ht 67.0 in | Wt 172.0 lb

## 2011-06-17 DIAGNOSIS — M1710 Unilateral primary osteoarthritis, unspecified knee: Secondary | ICD-10-CM

## 2011-06-17 DIAGNOSIS — IMO0002 Reserved for concepts with insufficient information to code with codable children: Secondary | ICD-10-CM

## 2011-06-17 DIAGNOSIS — M23329 Other meniscus derangements, posterior horn of medial meniscus, unspecified knee: Secondary | ICD-10-CM

## 2011-06-17 DIAGNOSIS — M179 Osteoarthritis of knee, unspecified: Secondary | ICD-10-CM

## 2011-06-17 NOTE — Progress Notes (Signed)
Patient ID: Jimmy Duran, male   DOB: Jun 28, 1955, 56 y.o.   MRN: [id number] Chief Complaint  Patient presents with  . Routine Post Op    post op 1, left knee, DOS 06/14/11    Status post arthroscopy LEFT knee or grade 4 chondral lesion medial femoral condyle Degenerative lateral meniscal tear  Status post partial medial meniscectomy with microfracture medial femoral condyle  The patient is doing well his protocol will be full weightbearing in a long leg brace followed by hinge brace  Initiate physical therapy for range of motion and quad sets exercises first 3 weeks followed by progressive resistance exercises and proprioceptive training for the following 3 weeks  Today his knee has minimal swelling flexion to 90 portals are clean  Followup 3 weeks apply brace at that time economy hinged

## 2011-06-17 NOTE — Patient Instructions (Addendum)
Schedule PT   Start Osteobiflex /get from [employer]

## 2011-06-25 ENCOUNTER — Ambulatory Visit (HOSPITAL_COMMUNITY)
Admission: RE | Admit: 2011-06-25 | Discharge: 2011-06-25 | Disposition: A | Discharge status: Home / Self Care | Payer: BC Managed Care – PPO | Source: Ambulatory Visit | Attending: Orthopedic Surgery | Admitting: Orthopedic Surgery

## 2011-06-25 DIAGNOSIS — M25669 Stiffness of unspecified knee, not elsewhere classified: Secondary | ICD-10-CM | POA: Insufficient documentation

## 2011-06-25 DIAGNOSIS — M25569 Pain in unspecified knee: Secondary | ICD-10-CM | POA: Insufficient documentation

## 2011-06-25 DIAGNOSIS — IMO0001 Reserved for inherently not codable concepts without codable children: Secondary | ICD-10-CM | POA: Insufficient documentation

## 2011-06-25 DIAGNOSIS — M6281 Muscle weakness (generalized): Secondary | ICD-10-CM | POA: Insufficient documentation

## 2011-06-25 NOTE — Evaluation (Addendum)
Physical Therapy Evaluation  Patient Details  Name: Jimmy Duran MRN: [id number] Date of Birth: 26-Oct-1955  Today's Date: 06/25/2011 Time: 4098-1191 Time Calculation (min): 43 min Charges: 1 eval Visit#: 1  of 12   Re-eval: 07/25/11 (MD apt on 07/09/11) Assessment Diagnosis: L knee scope w/microfracture Surgical Date: 06/14/11 Next MD Visit: Dr. Romeo Apple - 07/09/11 Prior Therapy: None  Past Medical History:  Past Medical History  Diagnosis Date  . Coronary artery disease     Nonobstructive  . Type 2 diabetes mellitus   . Mixed hyperlipidemia   . Essential hypertension, benign   . Carotid artery disease   . COPD (chronic obstructive pulmonary disease)   . GERD (gastroesophageal reflux disease)   . Hemorrhoids   . Diabetes mellitus    Past Surgical History:  Past Surgical History  Procedure Date  . Appendectomy 1970  . Cholecystectomy 1993  . Umbilical hernia repair 1993  . Breast surgery     Cyst resection on the right    Subjective Symptoms/Limitations Symptoms: Pt reports that he is not sure how he injured his knee.  He states that he woke up one morning and felt a pop in his knee.  He did not go to the doctor about 4 weeks later.  Pain range 4-7/10 .  See hx section for PMH.  He reports that he has increased cramping in his  hamstring/adductor regions How long can you sit comfortably?: No difficulty How long can you stand comfortably?: In the knee immobolizer 15 minutes How long can you walk comfortably?: In knee immobolizer 15 minutes Pain Assessment Currently in Pain?: Yes Pain Score:   4 Pain Location: Knee Pain Orientation: Left  Precautions/Restrictions  Precautions Precaution Comments: Inital 3 weekks WBAT and in brach with quad strengthening and ROM exercises (until 5/13).  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 Requires a lot of walking around throughout the day.   Leisure: Hobbies-yes (Comment) Comments: Has 3 children (34,8 and 6) and 4 grandchildren.  He is very active with his 2 youngest boys who play baseball. Very active with his sons baseball activities.  He enjoys walking for exercise 3-5 miles and find collect cans.   Cognition/Observation Observation/Other Assessments Observations: Comes in today with knee extension brace and bilateral crutches with cryocuff underneath. Other Assessments: Atrophy to L quadricep and hamstring and gluteal region  Sensation/Coordination/Flexibility/Functional Tests Functional Tests Functional Tests: Lower Extremity functional Scale (LEFS): 33/80  Assessment LLE AROM (degrees) Left Knee Extension 0-130: -3  Left Knee Flexion 0-140: 99  LLE PROM (degrees) Left Knee Extension 0-130: -1 Left Knee Flexion 0-140: 105 LLE Strength Left Hip Flexion: 4/5 Left Hip Extension: 4/5 Left Hip ABduction: 4/5 Left Hip ADduction: 4/5 Left Knee Flexion: 4/5 Left Knee Extension: 3+/5 Palpation Palpation: Increased pain and tenderness around the L knee joint and quadriceps muscle  Exercise/Treatments Mobility/Balance  Ambulation/Gait Ambulation/Gait: Yes Ambulation/Gait Assistance: 6: Modified independent (Device/Increase time) Assistive device: Crutches Gait Pattern: Step-to pattern  Standing Gait Training: w/unilateral crutch and instruction on gait training Supine Quad Sets: 5 reps;Other (comment) (10 sec holds; HEP) Short Arc [employer]: 5 reps;Other (comment) (5 sec  hold; HEP) Heel Slides: 10 reps (HEP) Straight Leg Raises: 10 reps;Left (HEP) Sidelying Hip  ABduction: Left;10 reps (HEP) Prone  Hamstring Curl: 10 reps (HEP) Hip Extension: Left;10 reps (HEP)      Physical Therapy Assessment and Plan PT Assessment and Plan Clinical Impression Statement: Pt is a 56 year old male referred to PT s/p L knee medial menisectomy and microfracture.  After examiniation it was found that he has current impairments including increased pain, increased L LE atrophy, decreased L LE strength, decreased L knee AROM, impaired gait, increased fascial restrcitions, increased muscle spasms, impaired LE flexibility, impaired balance, impaired gait and impaired percieved functional ability which is limiting him in his lesiure and work requirements.  Pt will benefit from skilled OP PT in order to address above impairments in order to maximize independence for safe return to work.  Rehab Potential: Good PT Frequency: Min 3X/week PT Duration: 8 weeks PT Treatment/Interventions: DME instruction;Gait training;Stair training;Functional mobility training;Therapeutic activities;Therapeutic exercise;Balance training;Neuromuscular re-education;Patient/family education;Other (comment) (Manual technique to increase ROM, modalities PRN) PT Plan: Inital 3 weekks WBAT and in brach with quad strengthening and ROM exercises (until 5/13).  Then 3 weeks will be closed chain activities PRE's and Proprioceptive exercise.  ADD: Clam shells, LAQ's, Bike for ROM, heel/toe roll in and outs, seated hee/toe raises, gait training eventually add hamstring circuit.  KEEP PAIN LEVEL LOW and improve overall AROM    Goals Home Exercise Program Pt will Perform Home Exercise Program: Independently PT Goal: Perform Home Exercise Program - Progress: Goal set today PT Short Term Goals Time to Complete Short Term Goals: 3 weeks PT Short Term Goal 1: Pt will improve LE strength by 1/2 muscle grade PT Short Term Goal 2: Pt will report pain less than 3/10 for 50% of his day.  PT Short Term Goal 3: Pt will  improve open chain AROM 0-115 degrees PT Short Term Goal 4: Pt will demonstrate L SLS x20 sec in knee immobolizer.  PT Long Term Goals Time to Complete Long Term Goals: 8 weeks PT Long Term Goal 1: Pt will improve his LE strength to [facility] in order to ascend and descend 10 stairs w/1 handrail with proper mechanics.  PT Long Term Goal 2: Pt will improve LE AROM to [facility] in order to ambulate for 1 hour with proper mechanics.  Long Term Goal 3: Pt will improve dynamic balance and demonstrate gait in open environment x10 minutes with appropriate gait mechanics in order to participate in childrens outdoor activities safely Long Term Goal 4: Pt will demonstrate appropriate LE flexibility and power and demonstrate 5 half kneels to stands independently in order to return to work activities.  PT Long Term Goal 5: Pt will improve his LEFS to 45/80 for improved percieved functional ability.  Problem List Patient Active Problem List  Diagnoses  . MIXED HYPERLIPIDEMIA  . Tobacco use disorder  . ESSENTIAL HYPERTENSION, BENIGN  . CORONARY ATHEROSCLEROSIS NATIVE CORONARY ARTERY  . CAROTID ARTERY DISEASE  . CHEST PAIN UNSPECIFIED  . GERD (gastroesophageal reflux disease)  . Diabetes mellitus  . COPD (chronic obstructive pulmonary disease)  . Hemorrhoids  . Headache  . Medial meniscus, posterior horn derangement  . OA (osteoarthritis) of knee    PT - End of Session Equipment Utilized During Treatment: Left knee immobilizer Activity Tolerance: Patient tolerated treatment well General Behavior During Session: [facility] for tasks performed Cognition: [facility] for tasks performed PT Plan of Care PT Home Exercise Plan: see scanned document Consulted and Agree  with Plan of Care: Patient     06/25/2011, 11:12 AM  Physician Documentation Your signature is required to indicate approval of the treatment plan as stated above.  Please sign and either send electronically or make a copy of this report for your  files and return this physician signed original.   Please mark one 1.__approve of plan  2. ___approve of plan with the following conditions.   ______________________________                                                          _____________________ Physician Signature                                                                                                             Date

## 2011-07-02 ENCOUNTER — Ambulatory Visit (HOSPITAL_COMMUNITY)
Admission: RE | Admit: 2011-07-02 | Discharge: 2011-07-02 | Disposition: A | Discharge status: Home / Self Care | Payer: BC Managed Care – PPO | Source: Ambulatory Visit | Attending: Internal Medicine | Admitting: Internal Medicine

## 2011-07-02 NOTE — Progress Notes (Signed)
Physical Therapy Treatment Patient Details  Name: Jimmy Duran MRN: [id number] Date of Birth: May 03, 1955  Today's Date: 07/02/2011 Time: 4098-1191 Time Calculation (min): 42 min Visit#: 2  of 12   Re-eval: 07/25/11 (MD apt on 07/09/2011)  Charge: therex 42 min  Subjective: Symptoms/Limitations Symptoms: Pt reports no pain this morning.  Pt entered dept amb with no AD. Pain Assessment Currently in Pain?: No/denies  Objective:   Exercise/Treatments Aerobic Stationary Bike: 6' @ 1.0 Seated Long Arc Quad: 10 reps Other Seated Knee Exercises: heel/toe raises 12 reps Other Seated Knee Exercises: heel/toe roll ins 5x 10" holds Supine Quad Sets: 10 reps Straight Leg Raises: Left;10 reps Sidelying Hip ABduction: Left;10 reps Clams: NMR for correct mm, tactile cueing to deactivate quad 10 reps x 10" Prone  Hamstring Curl: 10 reps Hip Extension: 10 reps      Physical Therapy Assessment and Plan PT Assessment and Plan Clinical Impression Statement: Began session per PT plan, pt able to perform all activities without difficulty following demonstration and explaination for purpose of exercise.  Pt with no c/o of pain with any activity today.   PT Plan: Continue with current POC, WBAT with standing activities with brace on, add supine hamstring circuit, SLS/vector stance with brace on next session.      Goals    Problem List Patient Active Problem List  Diagnoses  . MIXED HYPERLIPIDEMIA  . Tobacco use disorder  . ESSENTIAL HYPERTENSION, BENIGN  . CORONARY ATHEROSCLEROSIS NATIVE CORONARY ARTERY  . CAROTID ARTERY DISEASE  . CHEST PAIN UNSPECIFIED  . GERD (gastroesophageal reflux disease)  . Diabetes mellitus  . COPD (chronic obstructive pulmonary disease)  . Hemorrhoids  . Headache  . Medial meniscus, posterior horn derangement  . OA (osteoarthritis) of knee    PT - End of Session Activity Tolerance: Patient tolerated treatment well General Behavior During Session:  [facility] for tasks performed Cognition: [facility] for tasks performed  GP No functional reporting required  [name], PTA 07/02/2011, 8:57 AM

## 2011-07-03 ENCOUNTER — Ambulatory Visit (HOSPITAL_COMMUNITY)
Admission: RE | Admit: 2011-07-03 | Discharge: 2011-07-03 | Disposition: A | Discharge status: Home / Self Care | Payer: BC Managed Care – PPO | Source: Ambulatory Visit | Attending: Orthopedic Surgery | Admitting: Orthopedic Surgery

## 2011-07-03 DIAGNOSIS — M6281 Muscle weakness (generalized): Secondary | ICD-10-CM | POA: Insufficient documentation

## 2011-07-03 DIAGNOSIS — M25569 Pain in unspecified knee: Secondary | ICD-10-CM | POA: Insufficient documentation

## 2011-07-03 DIAGNOSIS — M25669 Stiffness of unspecified knee, not elsewhere classified: Secondary | ICD-10-CM | POA: Insufficient documentation

## 2011-07-03 DIAGNOSIS — IMO0001 Reserved for inherently not codable concepts without codable children: Secondary | ICD-10-CM | POA: Insufficient documentation

## 2011-07-03 NOTE — Progress Notes (Signed)
Physical Therapy Treatment Patient Details  Name: Jimmy Duran MRN: [id number] Date of Birth: 05/15/1955  Today's Date: 07/03/2011 Time: 4098-1191 PT Time Calculation (min): 32 min Charges: 46' TE  Visit#: 3  of 12   Re-eval: 07/25/11    Authorization:    Authorization Time Period:    Authorization Visit#:   of     Subjective: Symptoms/Limitations Symptoms: Pt reports that she is doing well overall.  he reports some soreness from moving it, but states it feels good to move it.  Pain Assessment Currently in Pain?: Yes Pain Location: Knee Pain Orientation: Left  Precautions/Restrictions  Precautions Precaution Comments: Inital 3 weekks WBAT and in brace with quad strengthening and ROM exercises (until 5/13).  Then 3 weeks will be closed chain activities PRE's and Proprioceptive exercise Required Braces or Orthoses: Knee Immobilizer - Left  Exercise/Treatments Aerobic Stationary Bike: 6' @ 2.0 for ROM and muscular endurance Standing SLS with Vectors: 5x 3 directions 5 sec holds balance on L leg Other Standing Knee Exercises: Hip Hikes BLE x10 on 6 in step Other Standing Knee Exercises: Tandem Gait 1 RT  Seated Long Arc Quad: 10 reps;Other (comment);Weights (10 sec holds) Long [employer] Weight: 2 lbs. Supine Quad Sets:  (HEP) Short Arc Quad Sets:  (HEP) Heel Slides:  (HEP) Bridges: 10 reps (10 sec holds) Straight Leg Raises: Left;15 reps;Other (comment) (2#) Sidelying Clams: 10x10 sec holds, able to complete w/o NMR Prone  Hamstring Curl: 15 reps;Limitations Hamstring Curl Limitations: 2#    Physical Therapy Assessment and Plan PT Assessment and Plan Clinical Impression Statement: Today's treatment was to continue with general open chain strengthening and added closed chained balance activities w/brace on.  Demonstrates increased muscular fatigue w/endurance activities. PT Plan: Cont to progress.  Brace until 07/15/11    Goals    Problem List Patient Active  Problem List  Diagnoses  . MIXED HYPERLIPIDEMIA  . Tobacco use disorder  . ESSENTIAL HYPERTENSION, BENIGN  . CORONARY ATHEROSCLEROSIS NATIVE CORONARY ARTERY  . CAROTID ARTERY DISEASE  . CHEST PAIN UNSPECIFIED  . GERD (gastroesophageal reflux disease)  . Diabetes mellitus  . COPD (chronic obstructive pulmonary disease)  . Hemorrhoids  . Headache  . Medial meniscus, posterior horn derangement  . OA (osteoarthritis) of knee    PT - End of Session Activity Tolerance: Patient tolerated treatment well  GP No functional reporting required    07/03/2011, 9:39 AM

## 2011-07-04 ENCOUNTER — Ambulatory Visit (HOSPITAL_COMMUNITY)
Admission: RE | Admit: 2011-07-04 | Discharge: 2011-07-04 | Disposition: A | Discharge status: Home / Self Care | Payer: BC Managed Care – PPO | Source: Ambulatory Visit | Attending: Orthopedic Surgery | Admitting: Orthopedic Surgery

## 2011-07-04 NOTE — Progress Notes (Signed)
Physical Therapy Treatment Patient Details  Name: Jimmy Duran MRN: [id number] Date of Birth: 1956/01/20  Today's Date: 07/04/2011 Time: 0801-0843 PT Time Calculation (min): 42 min Charges 42 TE Visit#: 4  of 12   Re-eval: 07/25/11    Authorization:    Authorization Time Period:    Authorization Visit#:   of     Subjective: Symptoms/Limitations Symptoms: He states he is a little sore from yesterday.  He reports he keeps doing his exercises at home, even on days he comes in.  Pain Assessment Currently in Pain?: Yes Pain Location: Knee Pain Orientation: Left  Precautions/Restrictions    Exercise/Treatments Stretches Quad Stretch: 3 reps;30 seconds Gastroc Stretch: 3 reps;30 seconds Aerobic Stationary Bike: 6' @ 3.0 for ROM and muscular endurance Standing Knee Flexion: Left;15 reps;Limitations Knee Flexion Limitations: 2# SLS with Vectors: 5x 3 directions 5 sec holds balance on L leg Other Standing Knee Exercises: Hip Hikes BLE x12 on 6 in step Other Standing Knee Exercises: Tandem Gait 2 RT, Retro Gait 3 RT, Side Stepping 2 RT Supine Short Arc Quad Sets: Left;10 reps (2#, 10 sec holds) Bridges: 15 reps (w/ball squeeze) Straight Leg Raises: Left;10 reps (10 sec holds) Sidelying Clams: 10x10 sec holds, able to complete w/o NMR Prone  Hamstring Curl: 15 reps;Limitations Hamstring Curl Limitations: 2#  Physical Therapy Assessment and Plan PT Assessment and Plan Clinical Impression Statement: Treatment consisted of improving overall strength and functional ROM.  He continues to improve his open chain AROM (0-115). PT Plan: Cont to progress.  Brace w/closed chain until 07/15/11    Goals    Problem List Patient Active Problem List  Diagnoses  . MIXED HYPERLIPIDEMIA  . Tobacco use disorder  . ESSENTIAL HYPERTENSION, BENIGN  . CORONARY ATHEROSCLEROSIS NATIVE CORONARY ARTERY  . CAROTID ARTERY DISEASE  . CHEST PAIN UNSPECIFIED  . GERD (gastroesophageal reflux  disease)  . Diabetes mellitus  . COPD (chronic obstructive pulmonary disease)  . Hemorrhoids  . Headache  . Medial meniscus, posterior horn derangement  . OA (osteoarthritis) of knee    PT - End of Session Activity Tolerance: Patient tolerated treatment well  GP No functional reporting required    07/04/2011, 8:45 AM sdf

## 2011-07-08 ENCOUNTER — Ambulatory Visit (HOSPITAL_COMMUNITY): Payer: BC Managed Care – PPO | Admitting: Physical Therapy

## 2011-07-09 ENCOUNTER — Encounter: Payer: Self-pay | Admitting: Orthopedic Surgery

## 2011-07-09 ENCOUNTER — Ambulatory Visit (INDEPENDENT_AMBULATORY_CARE_PROVIDER_SITE_OTHER): Payer: BC Managed Care – PPO | Admitting: Orthopedic Surgery

## 2011-07-09 VITALS — BP 140/78 | Ht 67.0 in | Wt 172.0 lb

## 2011-07-09 DIAGNOSIS — Z9889 Other specified postprocedural states: Secondary | ICD-10-CM | POA: Insufficient documentation

## 2011-07-09 DIAGNOSIS — IMO0002 Reserved for concepts with insufficient information to code with codable children: Secondary | ICD-10-CM

## 2011-07-09 DIAGNOSIS — M1710 Unilateral primary osteoarthritis, unspecified knee: Secondary | ICD-10-CM

## 2011-07-09 DIAGNOSIS — M23329 Other meniscus derangements, posterior horn of medial meniscus, unspecified knee: Secondary | ICD-10-CM

## 2011-07-09 NOTE — Progress Notes (Signed)
Patient ID: Jimmy Duran, male   DOB: 1955-04-07, 56 y.o.   MRN: [id number] Chief Complaint  Patient presents with  . 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 Grade 4 chondral lesion tibial plateau measuring 3 x 3 mm. Posterior horn degenerative meniscal tear.  Notch area normal  Lateral compartment, normal  Patellofemoral joint, normal  The patient had 3 days in a row therapy and his knee started popping. Again, he started having some pain, but he wasn't having before and we think things just may have been overworked.  [employer] has requested the following information. The patient will be out of work for or until June 13 he will follow-up with me June 12.  This office note will be forwarded.  The operative report is noted above.  No additional diagnostic tests tests have been done.  Therapy notes can be faxed as well.  The treatment plan is as follows. Physical therapy to regain strengthening and range of motion. He is in a hinged knee brace.  Current functionality he is limping and cannot walk without support.  Symptoms include catching and pain on the medial aspect of the knee, which are preventing him from returning to work.  Restrictions include limited weightbearing with bracing.  Next office visit. Again will be June 12.  Expected return to work June 13  Note current medications include hydrocodone, 10 mg, as well as cartilage supplementation with glucosamine/chondroitin.

## 2011-07-09 NOTE — Patient Instructions (Addendum)
Wear economy hinge brace when walking   Continue physical therapy: don't do daily back to back    OOW will be June 13

## 2011-07-10 ENCOUNTER — Ambulatory Visit (HOSPITAL_COMMUNITY)
Admission: RE | Admit: 2011-07-10 | Discharge: 2011-07-10 | Disposition: A | Discharge status: Home / Self Care | Payer: BC Managed Care – PPO | Source: Ambulatory Visit | Attending: Orthopedic Surgery | Admitting: Orthopedic Surgery

## 2011-07-10 NOTE — Progress Notes (Signed)
Physical Therapy Treatment Patient Details  Name: Jimmy Duran MRN: [id number] Date of Birth: June 15, 1955  Today's Date: 07/10/2011 Time: 4098-1191 PT Time Calculation (min): 35 min Charges: Te: 25', Man: 10' Visit#: 5  of 12   Re-eval: 07/25/10 Assessment Diagnosis: L knee scope w/microfracture Surgical Date: 06/14/11 Next MD Visit: Dr. Romeo Apple - 08/14/11   Subjective: Symptoms/Limitations Symptoms: Pt reports that he went to the MD and he is happy with his progress.  He has released him to using a neoprene brace when walking.  He will return to MD on 6/12 and hope to return to work on 6/13 Pain Assessment Currently in Pain?: Yes Pain Score:   3 Pain Location: Knee Pain Orientation: Medial;Lateral;Other (Comment) (joint line) Pain Type: Acute pain;Other (Comment) (throbbing)  Precautions/Restrictions  Precautions Precaution Comments: Neoprene Brace with long distance walking according to pt, unable to find recommendations in EPIC. DO NOT SCHEDULE THERAPY BACK TO BACK  Exercise/Treatments Aerobic Stationary Bike: 6' @ 3.0 for ROM and muscular endurance Standing Heel Raises: 10 reps;Limitations Heel Raises Limitations: Toe Raises: 10 reps Knee Flexion: Left;20 reps;Other (comment) Knee Flexion Limitations: 2# Functional Squat: 10 reps Rocker Board: 1 minute (R<>L) Supine Short Arc Quad Sets: 15 reps;Limitations Short Arc Quad Sets Limitations: 2# Bridges: 10 reps;Other (comment) (5 sec holds) Sidelying Hip ABduction: 5 reps;Other (comment) (Rainbows) Clams: 5x15 sec holds Prone  Hamstring Curl: 20 reps Hamstring Curl Limitations: 2# Hip Extension: Left;10 reps;Limitations Hip Extension Limitations: 2#   Manual Therapy Manual Therapy: Joint mobilization Joint Mobilization: Grade I-II joint mobs to L knee and patella to decrease pain  Physical Therapy Assessment and Plan PT Assessment and Plan Clinical Impression Statement: treatment focused on improving LE  strengtha and functional ROM.  Completed all activities without increasing pain today, even with increased weight and reps.  PT Plan: Cont to use neoprene brace with extended walking activities.     Goals    Problem List Patient Active Problem List  Diagnoses  . MIXED HYPERLIPIDEMIA  . Tobacco use disorder  . ESSENTIAL HYPERTENSION, BENIGN  . CORONARY ATHEROSCLEROSIS NATIVE CORONARY ARTERY  . CAROTID ARTERY DISEASE  . CHEST PAIN UNSPECIFIED  . GERD (gastroesophageal reflux disease)  . Diabetes mellitus  . COPD (chronic obstructive pulmonary disease)  . Hemorrhoids  . Headache  . Medial meniscus, posterior horn derangement  . OA (osteoarthritis) of knee  . H/O arthroscopy of left knee    PT - End of Session Activity Tolerance: Patient tolerated treatment well PT Plan of Care PT Patient Instructions: Discussed with patient to keep pain level low and to decrease weights, reps or activity when at home if he has increased pain.   GP No functional reporting required    07/10/2011, 9:23 AM

## 2011-07-12 ENCOUNTER — Ambulatory Visit (HOSPITAL_COMMUNITY)
Admission: RE | Admit: 2011-07-12 | Discharge: 2011-07-12 | Disposition: A | Discharge status: Home / Self Care | Payer: BC Managed Care – PPO | Source: Ambulatory Visit | Attending: Internal Medicine | Admitting: Internal Medicine

## 2011-07-12 NOTE — Progress Notes (Signed)
Physical Therapy Treatment Patient Details  Name: Jimmy Duran MRN: [id number] Date of Birth: 12/06/1955  Today's Date: 07/12/2011 Time: 0800-0845 PT Time Calculation (min): 45 min  Visit#: 6  of 12   Re-eval: 07/25/11  Charge: therex 45 min  Subjective: Symptoms/Limitations Symptoms: Pt states he is just sore today no real pain, reported compliance with HEP this morning.    Objective:   Exercise/Treatments Aerobic Stationary Bike: 6' @ 3.0 for ROM and muscular endurance Standing Heel Raises: 15 reps;Limitations Heel Raises Limitations: Toe Raises: 15 reps Knee Flexion: Left;20 reps;Other (comment) Knee Flexion Limitations: 2# TKF 6 in step Terminal Knee Extension: 15 reps;Theraband Theraband Level (Terminal Knee Extension): Level 4 (Blue) Functional Squat: 15 reps;3 seconds Rocker Board: 2 minutes;Limitations Rocker Board Limitations: R/L SLS: 18"max of 3 Other Standing Knee Exercises: Hip Hikes BLE x15 on 6 in step Other Standing Knee Exercises: Tandem gait 1/2 RT, Tandem gait on balance beam 2 RT Supine Short Arc Quad Sets: 15 reps;Limitations Bridges: 15 reps Sidelying Hip ABduction: 10 reps Clams: 10x 10 Prone  Hamstring Curl: 20 reps Hamstring Curl Limitations: 2# Hip Extension: Left;10 reps;Limitations Hip Extension Limitations: 2#      Physical Therapy Assessment and Plan PT Assessment and Plan Clinical Impression Statement: Pt progressing well towards total treatment with focus on LE strengthening and functional ROM.  Pt able to complete all activities with no c/o of increased pain with some increased reps weights the same this treatment.  Balance improving on static surfaces, progressed to balance beam with no difficulty maintaining balance on dynamic surface. PT Plan: Cont to use neoprene brace with extended walking activities, continue progressing current POC.    Goals    Problem List Patient Active Problem List  Diagnoses  . MIXED  HYPERLIPIDEMIA  . Tobacco use disorder  . ESSENTIAL HYPERTENSION, BENIGN  . CORONARY ATHEROSCLEROSIS NATIVE CORONARY ARTERY  . CAROTID ARTERY DISEASE  . CHEST PAIN UNSPECIFIED  . GERD (gastroesophageal reflux disease)  . Diabetes mellitus  . COPD (chronic obstructive pulmonary disease)  . Hemorrhoids  . Headache  . Medial meniscus, posterior horn derangement  . OA (osteoarthritis) of knee  . H/O arthroscopy of left knee    PT - End of Session Activity Tolerance: Patient tolerated treatment well General Behavior During Session: [facility] for tasks performed Cognition: [facility] for tasks performed  GP No functional reporting required  [name], PTA 07/12/2011, 12:13 PM

## 2011-07-16 ENCOUNTER — Inpatient Hospital Stay (HOSPITAL_COMMUNITY): Admission: RE | Admit: 2011-07-16 | Payer: BC Managed Care – PPO | Source: Ambulatory Visit

## 2011-07-17 ENCOUNTER — Ambulatory Visit (HOSPITAL_COMMUNITY)
Admission: RE | Admit: 2011-07-17 | Discharge: 2011-07-17 | Disposition: A | Discharge status: Home / Self Care | Payer: BC Managed Care – PPO | Source: Ambulatory Visit | Attending: Orthopedic Surgery | Admitting: Orthopedic Surgery

## 2011-07-17 NOTE — Progress Notes (Signed)
Physical Therapy Treatment Patient Details  Name: Jimmy Duran MRN: [id number] Date of Birth: 1955-11-10  Today's Date: 07/17/2011 Time: 4098-1191 PT Time Calculation (min): 48 min  Visit#: 7  of 12   Re-eval: 07/25/11  Charge: therex 42 min  Subjective: Symptoms/Limitations Symptoms: Pain free today.  Objective:     Exercise/Treatments Stretches Knee: Self-Stretch to increase Flexion: 1 rep;60 seconds;Limitations Knee: Self-Stretch Limitations: chair stretch Aerobic Stationary Bike: 6' @ 3.0 for ROM and muscular endurance Machines for Strengthening Cybex Knee Extension: 2 PL 15 reps Cybex Knee Flexion: 3 Pl 15 reps Standing Heel Raises: Limitations Heel Raises Limitations: heel/toe walking 2 RT Terminal Knee Extension: 15 reps;Theraband Theraband Level (Terminal Knee Extension): Level 4 (Blue) Functional Squat: 15 reps;3 seconds;Limitations Functional Squat Limitations: pick up ball off 6 in step Rocker Board: 2 minutes;Limitations Rocker Board Limitations: R/L SLS: L30", R 41" max of 3 Other Standing Knee Exercises: Hip Hikes BLE x15 on 6 in step Other Standing Knee Exercises: Tandem/ Retro tandem gait 3 RT on balance beam Supine Quad Sets: 15 reps Short Arc Quad Sets: Limitations;10 reps Terminal Knee Extension: 15 reps Prone  Hip Extension: 15 reps      Physical Therapy Assessment and Plan PT Assessment and Plan Clinical Impression Statement: Pt improving AROM and L LE strength. Able to progress to cybex weight machines without difficulty following instructions for proper technique with noted visible quad fatigue with increased reps PT Plan: Continue progressing current POC.    Goals    Problem List Patient Active Problem List  Diagnoses  . MIXED HYPERLIPIDEMIA  . Tobacco use disorder  . ESSENTIAL HYPERTENSION, BENIGN  . CORONARY ATHEROSCLEROSIS NATIVE CORONARY ARTERY  . CAROTID ARTERY DISEASE  . CHEST PAIN UNSPECIFIED  . GERD  (gastroesophageal reflux disease)  . Diabetes mellitus  . COPD (chronic obstructive pulmonary disease)  . Hemorrhoids  . Headache  . Medial meniscus, posterior horn derangement  . OA (osteoarthritis) of knee  . H/O arthroscopy of left knee    PT - End of Session Activity Tolerance: Patient tolerated treatment well General Behavior During Session: [facility] for tasks performed Cognition: [facility] for tasks performed  GP No functional reporting required  [name] 07/17/2011, 12:57 PM

## 2011-07-19 ENCOUNTER — Ambulatory Visit (HOSPITAL_COMMUNITY)
Admission: RE | Admit: 2011-07-19 | Discharge: 2011-07-19 | Disposition: A | Discharge status: Home / Self Care | Payer: BC Managed Care – PPO | Source: Ambulatory Visit | Attending: Internal Medicine | Admitting: Internal Medicine

## 2011-07-19 NOTE — Progress Notes (Signed)
Physical Therapy Treatment Patient Details  Name: Jimmy Duran MRN: [id number] Date of Birth: October 04, 1955  Today's Date: 07/19/2011 Time: 4098-1191 PT Time Calculation (min): 31 min Charges: 1 estim, Manual x15, TE: 8 Visit#: 8  of 12   Re-eval: 07/25/11   Subjective: Symptoms/Limitations Symptoms: Pt reports that he had been feeling really good and has been walking around a lot on his leg.  This morning he reports that he has pain that he has not had since the surgery.   he reports it is a nagging pain that is annoying and denies any sharp pain.  Pain Assessment Currently in Pain?: Yes Pain Score:   7 (Dull/Achy ) Pain Location: Knee Pain Orientation: Left Pain Type: Acute pain  Exercise/Treatments  Modalities Modalities: Cryotherapy;Electrical Stimulation Manual Therapy Manual Therapy: Joint mobilization Joint Mobilization: Grade I-III to L knee and proximal fibular head to decrease pain and improve knee flexion and extension. Patellar mobs each direction to decrease pain Electrical Stimulation Electrical Stimulation Location: IFES to L knee  Electrical Stimulation Action: IFES x15 minuts Electrical Stimulation Goals: Pain  Physical Therapy Assessment and Plan PT Assessment and Plan Clinical Impression Statement: treatment focus on decreasing knee pain using modalities and manual techniques. Pt instructed to decrease amount of walking and continue with open chain activities and with ice over the weekend.  PT Plan: Cont to decrease pain and improve general strength.     Goals    Problem List Patient Active Problem List  Diagnoses  . MIXED HYPERLIPIDEMIA  . Tobacco use disorder  . ESSENTIAL HYPERTENSION, BENIGN  . CORONARY ATHEROSCLEROSIS NATIVE CORONARY ARTERY  . CAROTID ARTERY DISEASE  . CHEST PAIN UNSPECIFIED  . GERD (gastroesophageal reflux disease)  . Diabetes mellitus  . COPD (chronic obstructive pulmonary disease)  . Hemorrhoids  . Headache  . Medial  meniscus, posterior horn derangement  . OA (osteoarthritis) of knee  . H/O arthroscopy of left knee    PT - End of Session Activity Tolerance: Patient tolerated treatment well     07/19/2011, 8:53 AM

## 2011-07-22 ENCOUNTER — Ambulatory Visit (HOSPITAL_COMMUNITY)
Admission: RE | Admit: 2011-07-22 | Discharge: 2011-07-22 | Disposition: A | Discharge status: Home / Self Care | Payer: BC Managed Care – PPO | Source: Ambulatory Visit | Attending: Orthopedic Surgery | Admitting: Orthopedic Surgery

## 2011-07-22 NOTE — Evaluation (Signed)
Physical Therapy Re-Evaluation  Patient Details  Name: Jimmy Duran MRN: [id number] Date of Birth: [birth year]  Today's Date: 07/22/2011 Time: 0865-7846 PT Time Calculation (min): 53 min Charges: 1 MMT, 1 ROM, 30' TE, 15' Manual Visit#: 9  of 12   Re-eval: 07/25/11 Assessment Surgical Date: 06/14/11 Next MD Visit: Dr. Romeo Apple - 08/14/11  Past Medical History:  Past Medical History  Diagnosis Date  . Coronary artery disease     Nonobstructive  . Type 2 diabetes mellitus   . Mixed hyperlipidemia   . Essential hypertension, benign   . Carotid artery disease   . COPD (chronic obstructive pulmonary disease)   . GERD (gastroesophageal reflux disease)   . Hemorrhoids   . Diabetes mellitus    Past Surgical History:  Past Surgical History  Procedure Date  . Appendectomy 1970  . Cholecystectomy 1993  . Umbilical hernia repair 1993  . Breast surgery     Cyst resection on the right    Subjective Symptoms/Limitations Symptoms: Pt reports that he is doing better today and has general soreness Pain Assessment Currently in Pain?: Yes Pain Score:   5 Pain Location: Knee Pain Orientation: Left Pain Type: Acute pain  Assessment LLE AROM (degrees) LLE Overall AROM Comments: Increased pain under knee cap at 71 degrees Left Knee Extension 0-130: 0  Left Knee Flexion 0-140: 125  LLE Strength Left Hip Flexion: 5/5 (4/5) Left Hip Extension: 5/5 (was 4/5) Left Hip ABduction: 5/5 (was 4/5) Left Hip ADduction: 5/5 (4/5) Left Knee Flexion: 5/5 (4/5) Left Knee Extension: 4/5 (was 3+/5, has increased cramping)  Exercise/Treatments Stretches Quad Stretch: 3 reps;30 seconds Gastroc Stretch: 3 reps;30 seconds Machines for Strengthening Cybex Knee Extension: 1 PL w/LLE eccentric lowering x6 pain free Cybex Knee Flexion: 4 PL BLE x10 Standing Heel Raises: Limitations Heel Raises Limitations: heel and toe walking 2 RT Lateral Step Up: 15 reps;Hand Hold: 1;Step Height: 4" Forward  Step Up: 15 reps;Step Height: 4" Wall Squat: 10 reps;5 seconds Rocker Board: 3 minutes Rocker Board Limitations: R/L SLS: SLS L 30 sec Other Standing Knee Exercises: Hip Hikes BLE x15 on 6 in step Seated Stool Scoot - Round Trips: 3 RT   Modalities Modalities: Electrical Stimulation;Cryotherapy Manual Therapy Manual Therapy: Joint mobilization Joint Mobilization: Patellar mobs all directions and Grade I-II  L knee mobs to tibia and femur to decrease pain Cryotherapy Number Minutes Cryotherapy: 10 Minutes Cryotherapy Location: Knee Type of Cryotherapy: Ice pack Pharmacologist Location: Premod to L lateral knee and Premod for hamstrings Electrical Stimulation Action: x10 min to both loctions Electrical Stimulation Goals: Pain  Physical Therapy Assessment and Plan PT Assessment and Plan Clinical Impression Statement: Mr. Granieri has attended 9 OP PT visits and has met all his STG and is progressing well towards his LTG.  He has improved his overall strength and ROM, he continues to be limited by functional strength, gait abdnormalities and increased knee pain (avg is 5/10).  Pt will benefit from skilled therapeutic intervention in order to improve on the following deficits: Pain;Increased fascial restricitons;Impaired perceived functional ability;Abnormal gait Rehab Potential: Good PT Frequency: Min 3X/week PT Duration: 4 weeks PT Treatment/Interventions: Gait training;Stair training;Therapeutic activities;Therapeutic exercise;Neuromuscular re-education;Patient/family education;Other (comment) (modalities and manual techniques for pain) PT Plan: Cont to address pain and work on fucntional activities including stairs, and lunges all without increasing current pain.     Goals Home Exercise Program Pt will Perform Home Exercise Program: Independently PT Goal: Perform Home Exercise Program - Progress: Met  PT Short Term Goals Time to Complete Short Term  Goals: 3 weeks PT Short Term Goal 1: Pt will improve LE strength by 1/2 muscle grade PT Short Term Goal 1 - Progress: Met PT Short Term Goal 2: Pt will report pain less than 3/10 for 50% of his day.  PT Short Term Goal 2 - Progress: Progressing toward goal PT Short Term Goal 3: Pt will improve open chain AROM 0-115 degrees PT Short Term Goal 3 - Progress: Met PT Short Term Goal 4: Pt will demonstrate L SLS x20 sec in knee immobolizer.  PT Short Term Goal 4 - Progress: Met PT Long Term Goals Time to Complete Long Term Goals: 8 weeks PT Long Term Goal 1: Pt will improve his LE strength to [facility] in order to ascend and descend 10 stairs w/1 handrail with proper mechanics.  PT Long Term Goal 1 - Progress: Progressing toward goal PT Long Term Goal 2: Pt will improve LE AROM to [facility] in order to ambulate for 1 hour with proper mechanics.  PT Long Term Goal 2 - Progress: Partly met (trendelenberg gait) Long Term Goal 3: Pt will improve dynamic balance and demonstrate gait in open environment x10 minutes with appropriate gait mechanics in order to participate in childrens outdoor activities safely Long Term Goal 3 Progress: Met Long Term Goal 4: Pt will demonstrate appropriate LE flexibility and power and demonstrate 5 half kneels to stands independently in order to return to work activities.  Long Term Goal 4 Progress: Progressing toward goal PT Long Term Goal 5: Pt will improve his LEFS to 45/80 for improved percieved functional ability. Long Term Goal 5 Progress: Progressing toward goal  Problem List Patient Active Problem List  Diagnoses  . MIXED HYPERLIPIDEMIA  . Tobacco use disorder  . ESSENTIAL HYPERTENSION, BENIGN  . CORONARY ATHEROSCLEROSIS NATIVE CORONARY ARTERY  . CAROTID ARTERY DISEASE  . CHEST PAIN UNSPECIFIED  . GERD (gastroesophageal reflux disease)  . Diabetes mellitus  . COPD (chronic obstructive pulmonary disease)  . Hemorrhoids  . Headache  . Medial meniscus, posterior horn  derangement  . OA (osteoarthritis) of knee  . H/O arthroscopy of left knee     07/22/2011, 9:53 AM  Physician Documentation Your signature is required to indicate approval of the treatment plan as stated above.  Please sign and either send electronically or make a copy of this report for your files and return this physician signed original.   Please mark one 1.__approve of plan  2. ___approve of plan with the following conditions.   ______________________________                                                          _____________________ Physician Signature  Date  

## 2011-07-24 ENCOUNTER — Ambulatory Visit (HOSPITAL_COMMUNITY): Payer: BC Managed Care – PPO

## 2011-07-26 ENCOUNTER — Ambulatory Visit (HOSPITAL_COMMUNITY)
Admission: RE | Admit: 2011-07-26 | Discharge: 2011-07-26 | Disposition: A | Discharge status: Home / Self Care | Payer: BC Managed Care – PPO | Source: Ambulatory Visit

## 2011-07-26 NOTE — Progress Notes (Signed)
Physical Therapy Treatment Patient Details  Name: Jimmy Duran MRN: [id number] Date of Birth: 10-26-1955  Today's Date: 07/26/2011 Time: 4098-1191 PT Time Calculation (min): 52 min  Visit#: 10  of 12   Re-eval: 08/21/11  Charge: therex 34 min Manual 8 min estim with ice 10 min  Subjective: Symptoms/Limitations Symptoms: Pt reported increased tenderness under knee cap today, pain scale 4/10.  Objective:   Exercise/Treatments Stretches Gastroc Stretch: 3 reps;30 seconds Aerobic Stationary Bike: 6' 3.0 Machines for Strengthening Cybex Knee Extension: 1 PL w/LLE eccentric lowering 10x Cybex Knee Flexion: 4.5 PL BLE x10 Standing Heel Raises: Limitations Heel Raises Limitations: heel and toe walking 2 RT Terminal Knee Extension: 20 reps;Theraband Theraband Level (Terminal Knee Extension): Level 4 (Blue) Lateral Step Up: 10 reps;Step Height: 4";Limitations Lateral Step Up Limitations: stopped early due to c/o pain with patella tracking Wall Squat: 10 reps;5 seconds Rocker Board: 3 minutes Rocker Board Limitations: R/L SLS: SLS L 17 sec max of 3 Other Standing Knee Exercises: Hip Hikes BLE x15 on 6 in step Supine Patellar Mobs: Done   Modalities Modalities: Cryotherapy;Electrical Stimulation Manual Therapy Manual Therapy: Joint mobilization Joint Mobilization: Patellar mobs all directions and Grade I-II L knee mobs to tibia and femur to decrease pain  Cryotherapy Number Minutes Cryotherapy: 10 Minutes Cryotherapy Location: Knee Type of Cryotherapy: Ice pack Pharmacologist Location: Premod to L lateral knee and Premod for hamstrings Electrical Stimulation Action: reduce pain Electrical Stimulation Goals: Pain  Physical Therapy Assessment and Plan PT Assessment and Plan Clinical Impression Statement: Audible popping noted with patella with knee flexion.  Held lateral step up due to pain increase at joint line this session.  Pt stated  pain reduction but not eliminated following manual patella and tib/fib joint mobs.  Ended session with estim and ice with pain reduced and better gait mechanics noted. PT Plan: Continue to address pain and work on functional activities including stairs and lunges all without increased pain.    Goals    Problem List Patient Active Problem List  Diagnoses  . MIXED HYPERLIPIDEMIA  . Tobacco use disorder  . ESSENTIAL HYPERTENSION, BENIGN  . CORONARY ATHEROSCLEROSIS NATIVE CORONARY ARTERY  . CAROTID ARTERY DISEASE  . CHEST PAIN UNSPECIFIED  . GERD (gastroesophageal reflux disease)  . Diabetes mellitus  . COPD (chronic obstructive pulmonary disease)  . Hemorrhoids  . Headache  . Medial meniscus, posterior horn derangement  . OA (osteoarthritis) of knee  . H/O arthroscopy of left knee    PT - End of Session Equipment Utilized During Treatment: Left knee immobilizer Activity Tolerance: Patient tolerated treatment well General Behavior During Session: [facility] for tasks performed Cognition: [facility] for tasks performed  GP No functional reporting required  [name], PTA 07/26/2011, 12:24 PM

## 2011-07-30 ENCOUNTER — Ambulatory Visit (HOSPITAL_COMMUNITY): Payer: BC Managed Care – PPO | Admitting: Physical Therapy

## 2011-07-31 ENCOUNTER — Ambulatory Visit (HOSPITAL_COMMUNITY)
Admission: RE | Admit: 2011-07-31 | Discharge: 2011-07-31 | Disposition: A | Discharge status: Home / Self Care | Payer: BC Managed Care – PPO | Source: Ambulatory Visit | Attending: Orthopedic Surgery | Admitting: Orthopedic Surgery

## 2011-07-31 NOTE — Progress Notes (Signed)
Physical Therapy Treatment Patient Details  Name: Jimmy Duran MRN: [id number] Date of Birth: Nov 10, 1955  Today's Date: 07/31/2011 Time: 4098-1191 PT Time Calculation (min): 47 min  Visit#: 11  of 22   Re-eval: 08/21/11 Assessment Diagnosis: L knee scope w/microfracture Surgical Date: 06/14/11 Next MD Visit: Dr. Romeo Apple - 08/14/11 Charge: IFES/Ice 15 min Manual 8 min therex 24 min  Subjective: Symptoms/Limitations Symptoms: Pt reported rough weekend, had family death and had to stand on feet alot.  Increased knee cap pain scale 6/10 today. Pain Assessment Currently in Pain?: Yes Pain Score:   6 Pain Location: Knee Pain Orientation: Left  Objective:   Exercise/Treatments Stretches Active Hamstring Stretch: 3 reps;30 seconds Gastroc Stretch: 3 reps;30 seconds Aerobic Stationary Bike: 6' 3.0 Supine Short Arc Quad Sets: 15 reps;Limitations Short Arc Quad Sets Limitations: 3# Bridges: 15 reps Patellar Mobs: Done   Modalities Modalities: Cryotherapy;Electrical Stimulation Manual Therapy Manual Therapy: Joint mobilization Joint Mobilization: Patellar mobs all directions and Grade I-II L knee mobs to tibia and femur to decrease pain Cryotherapy Number Minutes Cryotherapy: 15 Minutes Cryotherapy Location: Knee Type of Cryotherapy: Ice pack Pharmacologist Location: L knee Electrical Stimulation Action: reduce pain Electrical Stimulation Parameters: IFES hi/lo sweep L knee 17 voltz Electrical Stimulation Goals: Pain  Physical Therapy Assessment and Plan PT Assessment and Plan Clinical Impression Statement: Session focus on pain reduction, held all standing weight bearing activities this session.  Pt stated pain scale reduced to 3/10 underneath knee cap following manual patella and tib/fib joint mobs and estim with ice.   PT Plan: Continue to address pain and work on functional activities including stairs and lunges all without  increased pain    Goals    Problem List Patient Active Problem List  Diagnoses  . MIXED HYPERLIPIDEMIA  . Tobacco use disorder  . ESSENTIAL HYPERTENSION, BENIGN  . CORONARY ATHEROSCLEROSIS NATIVE CORONARY ARTERY  . CAROTID ARTERY DISEASE  . CHEST PAIN UNSPECIFIED  . GERD (gastroesophageal reflux disease)  . Diabetes mellitus  . COPD (chronic obstructive pulmonary disease)  . Hemorrhoids  . Headache  . Medial meniscus, posterior horn derangement  . OA (osteoarthritis) of knee  . H/O arthroscopy of left knee    PT - End of Session Equipment Utilized During Treatment: Left knee immobilizer Activity Tolerance: Patient tolerated treatment well General Behavior During Session: [facility] for tasks performed Cognition: [facility] for tasks performed  GP No functional reporting required  [name], PTA 07/31/2011, 8:44 AM

## 2011-08-02 ENCOUNTER — Ambulatory Visit (HOSPITAL_COMMUNITY)
Admission: RE | Admit: 2011-08-02 | Discharge: 2011-08-02 | Disposition: A | Discharge status: Home / Self Care | Payer: BC Managed Care – PPO | Source: Ambulatory Visit | Attending: Internal Medicine | Admitting: Internal Medicine

## 2011-08-02 NOTE — Progress Notes (Signed)
Physical Therapy Treatment Patient Details  Name: Jimmy Duran MRN: [id number] Date of Birth: [birth year]  Today's Date: 08/02/2011 Time: 5621-3086 PT Time Calculation (min): 47 min Charges: 24' TE, 8' Manual, 1 e-stim Visit#: 12  of 22   Re-eval: 08/14/11    Authorization:    Authorization Time Period:    Authorization Visit#:   of     Subjective: Symptoms/Limitations Symptoms: My knee has been bothering me a lot lately.  I have this bump on my knee that is really bothering me and it is sore.  It is popping a lot under my knee cap.  Without pain meds 6/10; with pain meds 2-3/10.  If I still have this pain when I go back to work i am going to need some medication.  I am going to return on 6/13. Pertinent History: Notable pre-patellar bursitis  Pain Assessment Currently in Pain?: Yes Pain Score:   3  Precautions/Restrictions     Exercise/Treatments Mobility/Balance        Stretches Gastroc Stretch: 3 reps;30 seconds Aerobic Elliptical: 4' w/cueing for stride length ("this is the best my knee has felt") Machines for Strengthening Cybex Knee Extension: 1.5 Pl x20 w/knee brack Cybex Knee Flexion: 5 PL x20 w/knee brace Plyometrics   Standing Knee Flexion: 20 reps;Left;Other (comment) (5#) Wall Squat: 10 reps;5 seconds (without increased pain) Rocker Board: 2 minutes (pain free) Other Standing Knee Exercises: Heel and Toe Walking 3 RT each Seated   Supine   Sidelying   Prone      Modalities Modalities: Cryotherapy;Electrical Stimulation Manual Therapy Joint Mobilization: Patellar mobs all directions and Grade I-II L knee mobs to tibia and femur to decrease pain  Cryotherapy Number Minutes Cryotherapy: 15 Minutes Cryotherapy Location: Knee Type of Cryotherapy: Ice pack Pharmacologist Location: L anterior knee Electrical Stimulation Action: IFES x15 min. Electrical Stimulation Goals: Pain  Physical Therapy Assessment and  Plan PT Assessment and Plan Clinical Impression Statement: Treatment focused on encoroporating ther-ex that did not cause pain and improved functional strength and promotes appropriate gait mechanics.  Added elliptical to end of treatment to promote approrpate stride length.  Only able to complete 10 reps of Hamstring machine secondary to knee pain and not due to weakness to LE.  had decreased pain after e-stim and ice today.  PT Plan: Continue to address pain and work on functional activities including stairs and lunges all without increased pain    Goals    Problem List Patient Active Problem List  Diagnoses  . MIXED HYPERLIPIDEMIA  . Tobacco use disorder  . ESSENTIAL HYPERTENSION, BENIGN  . CORONARY ATHEROSCLEROSIS NATIVE CORONARY ARTERY  . CAROTID ARTERY DISEASE  . CHEST PAIN UNSPECIFIED  . GERD (gastroesophageal reflux disease)  . Diabetes mellitus  . COPD (chronic obstructive pulmonary disease)  . Hemorrhoids  . Headache  . Medial meniscus, posterior horn derangement  . OA (osteoarthritis) of knee  . H/O arthroscopy of left knee    PT - End of Session Equipment Utilized During Treatment: Left knee immobilizer Activity Tolerance: Patient tolerated treatment well General Behavior During Session: [facility] for tasks performed Cognition: [id number] for tasks performed  GP No functional reporting required    08/02/2011, 9:27 AM

## 2011-08-05 ENCOUNTER — Ambulatory Visit (HOSPITAL_COMMUNITY): Payer: BC Managed Care – PPO | Admitting: Physical Therapy

## 2011-08-07 ENCOUNTER — Encounter: Payer: Self-pay | Admitting: Orthopedic Surgery

## 2011-08-07 ENCOUNTER — Ambulatory Visit (HOSPITAL_COMMUNITY)
Admission: RE | Admit: 2011-08-07 | Discharge: 2011-08-07 | Disposition: A | Discharge status: Home / Self Care | Payer: BC Managed Care – PPO | Source: Ambulatory Visit | Attending: Internal Medicine | Admitting: Internal Medicine

## 2011-08-07 ENCOUNTER — Ambulatory Visit (INDEPENDENT_AMBULATORY_CARE_PROVIDER_SITE_OTHER): Payer: BC Managed Care – PPO | Admitting: Orthopedic Surgery

## 2011-08-07 VITALS — BP 160/110 | Ht 67.0 in | Wt 172.0 lb

## 2011-08-07 DIAGNOSIS — M7650 Patellar tendinitis, unspecified knee: Secondary | ICD-10-CM

## 2011-08-07 DIAGNOSIS — M25569 Pain in unspecified knee: Secondary | ICD-10-CM | POA: Insufficient documentation

## 2011-08-07 DIAGNOSIS — M7652 Patellar tendinitis, left knee: Secondary | ICD-10-CM | POA: Insufficient documentation

## 2011-08-07 DIAGNOSIS — M6281 Muscle weakness (generalized): Secondary | ICD-10-CM | POA: Insufficient documentation

## 2011-08-07 DIAGNOSIS — IMO0001 Reserved for inherently not codable concepts without codable children: Secondary | ICD-10-CM | POA: Insufficient documentation

## 2011-08-07 DIAGNOSIS — M25669 Stiffness of unspecified knee, not elsewhere classified: Secondary | ICD-10-CM | POA: Insufficient documentation

## 2011-08-07 MED ORDER — PREDNISONE 10 MG PO TABS: 10.0000 mg | ORAL_TABLET | Freq: Every day | ORAL | Status: DC

## 2011-08-07 MED ORDER — HYDROCODONE-ACETAMINOPHEN 7.5-325 MG PO TABS: 1.0000 | ORAL_TABLET | ORAL | Status: AC | PRN

## 2011-08-07 NOTE — Patient Instructions (Signed)
No therapy this week

## 2011-08-07 NOTE — Progress Notes (Signed)
Physical Therapy Treatment Patient Details  Name: Jimmy Duran MRN: [id number] Date of Birth: 1955/12/27  Today's Date: 08/07/2011 Time: 4098-1191 PT Time Calculation (min): 43 min  Visit#: 14  of 22   Re-eval: 08/14/11 Assessment Diagnosis: L knee scope w/microfracture Surgical Date: 06/14/11 Next MD Visit: Dr. Romeo Apple - 08/14/11 Charge: therex 20 min Manual 8 min IFES/ ice 15 min   Subjective: Symptoms/Limitations Symptoms: My knee is bothering me even more today, pain scale 5-6/10 feels like my patella is directly rubbing bones. Pain Assessment Currently in Pain?: Yes Pain Score:   6 Pain Location: Knee Pain Orientation: Left  Objective:   Exercise/Treatments Stretches Active Hamstring Stretch: 3 reps;30 seconds Quad Stretch: 3 reps;30 seconds Gastroc Stretch: 3 reps;30 seconds Aerobic Elliptical: 2' stopped early due to pain   Modalities Modalities: Cryotherapy;Electrical Stimulation Manual Therapy Manual Therapy: Joint mobilization Joint Mobilization: Patellar mobs all directions and Grade I-II L knee mobs to tibia and femur to decrease pain Cryotherapy Number Minutes Cryotherapy: 15 Minutes Cryotherapy Location: Knee Type of Cryotherapy: Ice pack Pharmacologist Location: L knee Statistician Action: IFES to reduce pain Electrical Stimulation Parameters: IFES hi/lo sweep L knee 17 voltz  Electrical Stimulation Goals: Pain  Physical Therapy Assessment and Plan PT Assessment and Plan Clinical Impression Statement: Pt stated increase pain under patella  on elliptical initially at session.  Session focus on pain reduction with manual, IFES and stretches with therex.  Pt stated pain reduced to 3/10 at end of session, reported pain reduction usually lasts for 6-8 hours but pain increases at night.   PT Plan: Continue to address pain and work on functional activities including stairs and lunges all without increased pain.     Goals    Problem List Patient Active Problem List  Diagnoses  . MIXED HYPERLIPIDEMIA  . Tobacco use disorder  . ESSENTIAL HYPERTENSION, BENIGN  . CORONARY ATHEROSCLEROSIS NATIVE CORONARY ARTERY  . CAROTID ARTERY DISEASE  . CHEST PAIN UNSPECIFIED  . GERD (gastroesophageal reflux disease)  . Diabetes mellitus  . COPD (chronic obstructive pulmonary disease)  . Hemorrhoids  . Headache  . Medial meniscus, posterior horn derangement  . OA (osteoarthritis) of knee  . H/O arthroscopy of left knee    PT - End of Session Equipment Utilized During Treatment: Left knee immobilizer Activity Tolerance: Patient tolerated treatment well General Behavior During Session: [facility] for tasks performed Cognition: [facility] for tasks performed  GP No functional reporting required  [name], PTA 08/07/2011, 10:29 AM

## 2011-08-07 NOTE — Progress Notes (Signed)
Patient ID: Jimmy Duran, male   DOB: 07-01-1955, 56 y.o.   MRN: [id number] Chief Complaint  Patient presents with  . Follow-up    Recheck on left knee. 4.12.2013    BP 160/110  Ht 5\' 7"  (1.702 m)  Wt 172 lb (78.019 kg)  BMI 26.94 kg/m2  The patient is complaining of anterior knee pain after his physical therapy status post knee arthroscopy. Although he had arthritis in his knee his patellofemoral joint was completely normal so this has to be a therapy issue.  He is having pain around his patella and with patella mobility and intense tenderness at the patellar tendon insertion  Impression patellar tendinitis  Plan rest, 10 mg of prednisone a day. Continue hydrocodone decrease dose to 7.5 mg no therapy this week followup 1 week

## 2011-08-08 ENCOUNTER — Telehealth (HOSPITAL_COMMUNITY): Payer: Self-pay

## 2011-08-09 ENCOUNTER — Ambulatory Visit (HOSPITAL_COMMUNITY): Payer: BC Managed Care – PPO

## 2011-08-13 ENCOUNTER — Ambulatory Visit (HOSPITAL_COMMUNITY)
Admission: RE | Admit: 2011-08-13 | Discharge: 2011-08-13 | Disposition: A | Discharge status: Home / Self Care | Payer: BC Managed Care – PPO | Source: Ambulatory Visit

## 2011-08-13 NOTE — Progress Notes (Signed)
Physical Therapy Treatment Patient Details  Name: Jimmy Duran MRN: [id number] Date of Birth: 1955-06-08  Today's Date: 08/13/2011 Time: 0950-1005 PT Time Calculation (min): 15 min Charges: 15' Self Care Visit#: 15  of    Re-eval:      Subjective: Symptoms/Limitations Symptoms: Pt reports that he has been pain free for the past 3 days since he has been taking prednisone.  He has been placed on 10 mg for 21 days. He is planning on returning to work tomorrow.  he reports that he does not believe he was overworked in PT, he thinks that he overworked it at home by continuously walking, since that is what his job entails and he has to be ready to return to his job.  He notices he has improved his gait mechanics and reports that his bump on the outside of his knee has gone down.  Pain Assessment Currently in Pain?: No/denies  Exercise/Treatments Discussed possible return of pain when predinose expires.  Discussed options to help with his knee cap pain and to discuss possible surgical options with Dr. Alto Denver.  He reports he is likely to wait until the next year to continue with any surgery secodnary to short and long term disability.  Physical Therapy Assessment and Plan PT Assessment and Plan Clinical Impression Statement: Jimmy Duran attended 15 OP PT vistis s/p L knee scope w/microfracture.  He had his greatest limitations with his pain, which has now been eliminated by prednisone pack.  He reports that he is now ready to return to work.  Educated pt on possibility of pain returning after prednisone pack wears off.  Pt is to be D/C from PT.  PT Plan: D/C    Goals    Problem List Patient Active Problem List  Diagnoses  . MIXED HYPERLIPIDEMIA  . Tobacco use disorder  . ESSENTIAL HYPERTENSION, BENIGN  . CORONARY ATHEROSCLEROSIS NATIVE CORONARY ARTERY  . CAROTID ARTERY DISEASE  . CHEST PAIN UNSPECIFIED  . GERD (gastroesophageal reflux disease)  . Diabetes mellitus  . COPD (chronic  obstructive pulmonary disease)  . Hemorrhoids  . Headache  . Medial meniscus, posterior horn derangement  . OA (osteoarthritis) of knee  . H/O arthroscopy of left knee  . Patellar tendinitis of left knee    PT Plan of Care PT Patient Instructions: Discussed possible return of pain when predinose expires.  Discussed options to help with his knee cap pain and to discuss possible surgical options with Dr. Alto Denver.  He reports he is likely to wait until the next year to continue with any surgery secodnary to short and long term disability.  Consulted and Agree with Plan of Care: Patient  GP No functional reporting required    08/13/2011, 10:16 AM

## 2011-08-14 ENCOUNTER — Encounter: Payer: Self-pay | Admitting: Orthopedic Surgery

## 2011-08-14 ENCOUNTER — Ambulatory Visit (INDEPENDENT_AMBULATORY_CARE_PROVIDER_SITE_OTHER): Payer: BC Managed Care – PPO | Admitting: Orthopedic Surgery

## 2011-08-14 VITALS — BP 140/80 | Ht 67.0 in | Wt 172.0 lb

## 2011-08-14 DIAGNOSIS — Z9889 Other specified postprocedural states: Secondary | ICD-10-CM

## 2011-08-14 DIAGNOSIS — M1710 Unilateral primary osteoarthritis, unspecified knee: Secondary | ICD-10-CM

## 2011-08-14 DIAGNOSIS — IMO0002 Reserved for concepts with insufficient information to code with codable children: Secondary | ICD-10-CM

## 2011-08-14 DIAGNOSIS — M7652 Patellar tendinitis, left knee: Secondary | ICD-10-CM

## 2011-08-14 DIAGNOSIS — M7650 Patellar tendinitis, unspecified knee: Secondary | ICD-10-CM

## 2011-08-14 DIAGNOSIS — M23329 Other meniscus derangements, posterior horn of medial meniscus, unspecified knee: Secondary | ICD-10-CM

## 2011-08-14 DIAGNOSIS — M179 Osteoarthritis of knee, unspecified: Secondary | ICD-10-CM

## 2011-08-14 NOTE — Patient Instructions (Addendum)
RTW TOMORROW  

## 2011-08-14 NOTE — Progress Notes (Signed)
Patient ID: Jimmy Duran, male   DOB: 1955/07/29, 56 y.o.   MRN: [id number] Chief Complaint  Patient presents with  . Follow-up    5 week recheck left knee, DOS 06/14/11    BP 140/80  Ht 5\' 7"  (1.702 m)  Wt 172 lb (78.019 kg)  BMI 26.94 kg/m2  Followup visit status post arthroscopy left knee microfracture debridement  Developed some anterior knee pain in therapy. Arthroscopic findings showed no patellofemoral pathology so we thought this was secondary to rehabilitation exercises. We put him on a Dosepak stop his rehabilitation his symptoms have all but resolved  The patient says he is ready to go back to work  Return to work tomorrow followup with [name] as needed

## 2011-08-17 ENCOUNTER — Encounter (HOSPITAL_COMMUNITY): Payer: Self-pay | Admitting: *Deleted

## 2011-08-17 ENCOUNTER — Emergency Department (HOSPITAL_COMMUNITY)
Admission: EM | Admit: 2011-08-17 | Discharge: 2011-08-17 | Disposition: A | Discharge status: Home / Self Care | Payer: BC Managed Care – PPO | Attending: Emergency Medicine | Admitting: Emergency Medicine

## 2011-08-17 DIAGNOSIS — F17200 Nicotine dependence, unspecified, uncomplicated: Secondary | ICD-10-CM | POA: Insufficient documentation

## 2011-08-17 DIAGNOSIS — E782 Mixed hyperlipidemia: Secondary | ICD-10-CM | POA: Insufficient documentation

## 2011-08-17 DIAGNOSIS — R739 Hyperglycemia, unspecified: Secondary | ICD-10-CM

## 2011-08-17 DIAGNOSIS — E119 Type 2 diabetes mellitus without complications: Secondary | ICD-10-CM | POA: Insufficient documentation

## 2011-08-17 DIAGNOSIS — I10 Essential (primary) hypertension: Secondary | ICD-10-CM | POA: Insufficient documentation

## 2011-08-17 DIAGNOSIS — K219 Gastro-esophageal reflux disease without esophagitis: Secondary | ICD-10-CM | POA: Insufficient documentation

## 2011-08-17 DIAGNOSIS — I2510 Atherosclerotic heart disease of native coronary artery without angina pectoris: Secondary | ICD-10-CM | POA: Insufficient documentation

## 2011-08-17 DIAGNOSIS — Z7982 Long term (current) use of aspirin: Secondary | ICD-10-CM | POA: Insufficient documentation

## 2011-08-17 DIAGNOSIS — J4489 Other specified chronic obstructive pulmonary disease: Secondary | ICD-10-CM | POA: Insufficient documentation

## 2011-08-17 DIAGNOSIS — Z79899 Other long term (current) drug therapy: Secondary | ICD-10-CM | POA: Insufficient documentation

## 2011-08-17 DIAGNOSIS — J449 Chronic obstructive pulmonary disease, unspecified: Secondary | ICD-10-CM | POA: Insufficient documentation

## 2011-08-17 LAB — GLUCOSE, CAPILLARY: Glucose-Capillary: 231 mg/dL — ABNORMAL HIGH (ref 70–99)

## 2011-08-17 MED ORDER — MIGLITOL 100 MG PO TABS: 100.0000 mg | ORAL_TABLET | Freq: Three times a day (TID) | ORAL | Status: DC

## 2011-08-17 NOTE — ED Notes (Addendum)
Pt c/o 370 blood sugar, pt states that he was "borderline" diabetic a few years ago, was able to control his blood sugar and he was taken off his medications. Pt woke up this am jittery, not feeling right, pt states that he has been checking his blood sugar at home today and it has been over 200 all day. Blood sugar 239 at triage.

## 2011-08-17 NOTE — Discharge Instructions (Signed)
RESOURCE GUIDE  Chronic Pain Problems: Contact [facility]  [phone] Patients need to be referred by their primary care doctor.  Insufficient Money for Medicine: Contact United Way:  call "211" or [facility] [phone].  No Primary Care Doctor: - Call [facility]  [phone] - can help you locate a primary care doctor that  accepts your insurance, provides certain services, etc. - Physician Referral Service- 1-800-533-3463  Agencies that provide inexpensive medical care: - [facility]  [phone] - [facility]  [phone] - [facility]  [phone] - [facility] Clinic  [phone] - Planned Parenthood  [phone] - [facility]  [phone]  Medicaid-accepting [place] Providers: - [facility]- [address]  [phone], Mon-Fri 9am-7pm, Sat 9am-1pm - [facility]- [address], [address]  [phone] - [facility]- [address], [address]  [phone] - [facility]- [address] [place] [address]  [phone] - Veita Bland- [address], [address], [phone]  Only accepts [place] Access Medicaid patients after they have their name  applied to their card  Self Pay (no insurance) in [place]: - Sickle Cell Patients: Dr Eric Dean, [facility]  [address], [phone] - [facility]- [address]  [phone]       -     [facility]- [address]       -     [facility]- see information above (Speak to Pam H if you do not have insurance)       -  [facility]- [address], [phone]       -  [facility] [place]- [address],  [phone]       -  [facility]- [address] [place] [address], [phone]       -  Dr Osei-Bonsu-  [address], [place], [phone]       -  [facility]- [address], [phone]       -  [facility]- [address] [place] [address], [phone], also [address], [phone]       -    [facility]- [address], [phone], 1st & 3rd Saturday   every month, 10am-1pm  1) Find a Doctor and Pay Out of Pocket Although you won't have to find out who is covered by your insurance plan, it is a good idea to ask around and get recommendations. You will then need to call the office and see if the doctor you have chosen will accept you as a new patient and what types of options they offer for patients who are self-pay. Some doctors offer discounts or will set up payment plans for their patients who do not have insurance, but you will need to ask so you aren't surprised when you get to your appointment.  2) Contact Your Local Health Department Not all health departments have doctors that can see patients for sick visits, but many do, so it is worth a call to see if yours does. If you don't know where your local health department is, you can check in your phone book. The CDC also has a tool to help you locate your state's health department, and many state websites also have   listings of all of their local health departments.  3) Find a Walk-in Clinic If your illness is not likely to be very severe or complicated, you may want to try a walk in clinic. These are popping up all over the country in pharmacies, drugstores, and shopping centers. They're usually staffed by nurse practitioners or physician assistants that have been trained to treat common illnesses and complaints. They're usually fairly quick and inexpensive. However, if you have serious medical issues or chronic medical problems, these are probably not your best option  STD Testing - [place] [facility] [place], [facility], [address], [place], phone [phone] or 1-877-539-9860.  Monday - Friday, call for an appointment. - [place]  [facility] [place], [facility] Clinic, [address], [place], phone [phone] or 1-877-539-9860.  Monday - Friday, call for an appointment.  Abuse/Neglect: - [place] Child Abuse Hotline (336) 641-3795 - [place] Child Abuse Hotline 800-378-5315 (After Hours)  Emergency Shelter:  [facility] (336) 271-5985  Maternity Homes: - [facility] (336) 275-9566 - [facility] (704) 372-4663  MRSA Hotline #:   832-7006  [place] Resources  [facility] [place]  [employer] [place] [facility] [address]                 [address]         [address]  [place]                                               [place]                              [place] Phone:  [phone]                                  Phone:  [phone]                   Phone:  [phone]  [place] [facility], [phone] - [place] Services - [facility]- 1-888-581-9988       -     [facility] in [place], [address],                                  336-349-4454, Insurance  [place] Child Abuse Hotline (336) 342-1394 or (336) 342-3537 (After Hours)   Behavioral Health Services  Substance Abuse Resources: - [facility]  336-882-2125 - [facility] 336-784-9470 - [facility] 336-285-9073 - [facility] 336-845-3988 - Residential & Outpatient Substance Abuse Program  800-659-3381  Psychological Services: -  Health  832-9600 - [facility]  [phone] - [place], [address], [place], ACCESS LINE: 1-800-853-5163 or 336-641-4981, Http://www.[facility].com/services/adult.htm  Dental Assistance  If unable to pay or uninsured, contact:  [facility] or [place] [facility] to become qualified for the adult dental  clinic.  Patients with Medicaid:  [facility] [address], [phone], [phone]  If unable   to pay, or uninsured, contact [facility] (380)301-6212) or [facility] Department 505-225-1556 in [place], [phone] in [facility]) to become qualified for the adult dental clinic  Other Low-Cost Community Dental Services: - [facility]- [address] [place], [place], [place], [address], [phone], Ext. [address], 2nd and 4th Thursday of the month at 6:30am.  10 clients each day by appointment, can sometimes see walk-in patients if someone does not show for an appointment. [facility]- [address] [place], [place], [address], [phone] - [facility]- [address] [place], [place], [address], [phone] [facility]- [phone] - [facility] Department- [phone]     rx for glyset given.  F/u your primary care dr -

## 2011-08-30 ENCOUNTER — Ambulatory Visit: Payer: BC Managed Care – PPO | Admitting: Family Medicine

## 2011-09-10 NOTE — ED Provider Notes (Addendum)
History     CSN: [id number]  Arrival date & time 08/17/11  1654   First MD Initiated Contact with Patient 08/17/11 1732      Chief Complaint  Patient presents with  . Hyperglycemia    (Consider location/radiation/quality/duration/timing/severity/associated sxs/prior treatment) HPI....high blood glucose today.  boderline Diabetic in the past.  Feels jittery.  No polyuria, polydipsia, polyphagia, cp, sob.  Nothing makes sxs better/worse. Severity is moderate.  Patient is alert, oriented, normal mentation  Past Medical History  Diagnosis Date  . Coronary artery disease     Nonobstructive  . Type 2 diabetes mellitus   . Mixed hyperlipidemia   . Essential hypertension, benign   . Carotid artery disease   . COPD (chronic obstructive pulmonary disease)   . GERD (gastroesophageal reflux disease)   . Hemorrhoids   . Diabetes mellitus     Past Surgical History  Procedure Date  . Appendectomy 1970  . Cholecystectomy 1993  . Umbilical hernia repair 1993  . Breast surgery     Cyst resection on the right    Family History  Problem Relation Age of Onset  . Coronary artery disease Father     CABG in his 22's    History  Substance Use Topics  . Smoking status: Current Everyday Smoker -- 0.5 packs/day for 15 years    Types: Cigarettes  . Smokeless tobacco: Never Used  . Alcohol Use: Yes     Socially      Review of Systems  All other systems reviewed and are negative.    Allergies  Review of patient's allergies indicates no known allergies.  Home Medications   Current Outpatient Rx  Name Route Sig Dispense Refill  . ASPIRIN EC 81 MG PO TBEC Oral Take 81 mg by mouth every morning.     Marland Kitchen DIPHENHYDRAMINE HCL 25 MG PO TABS Oral Take 25 mg by mouth every morning.    Marland Kitchen OMEGA-3 FATTY ACIDS 1000 MG PO CAPS Oral Take 2 g by mouth. Take 2 tablets twice a day    . LISINOPRIL-HYDROCHLOROTHIAZIDE 20-12.5 MG PO TABS      . PREDNISONE 10 MG PO TABS Oral Take 1 tablet (10 mg  total) by mouth daily. 21 tablet 0  . RANITIDINE HCL 150 MG PO CAPS Oral Take 300 mg by mouth every morning.     Marland Kitchen SIMVASTATIN 40 MG PO TABS Oral Take 1 tablet (40 mg total) by mouth every morning. 30 tablet 6  . MIGLITOL 100 MG PO TABS Oral Take 1 tablet (100 mg total) by mouth 3 (three) times daily with meals. 30 tablet 0    BP 137/85  Pulse 94  Temp 98.1 F (36.7 C) (Oral)  Resp 20  Ht 5\' 7"  (1.702 m)  Wt 170 lb (77.111 kg)  BMI 26.63 kg/m2  SpO2 97%  Physical Exam  Nursing note and vitals reviewed. Constitutional: He is oriented to person, place, and time. He appears well-developed and well-nourished.  HENT:  Head: Normocephalic and atraumatic.  Eyes: Conjunctivae and EOM are normal. Pupils are equal, round, and reactive to light.  Neck: Normal range of motion. Neck supple.  Cardiovascular: Normal rate and regular rhythm.   Pulmonary/Chest: Effort normal and breath sounds normal.  Abdominal: Soft. Bowel sounds are normal.  Musculoskeletal: Normal range of motion.  Neurological: He is alert and oriented to person, place, and time.  Skin: Skin is warm and dry.  Psychiatric: He has a normal mood and affect.    ED  Course  Procedures (including critical care time)  Labs Reviewed  GLUCOSE, CAPILLARY - Abnormal; Notable for the following:    Glucose-Capillary 231 (*)     All other components within normal limits  LAB REPORT - SCANNED   No results found.   1. Hyperglycemia       MDM  Glucose min to mod elevated.  Will get primary care f/u.  No clinical evidence of DKA        Donnetta Hutching, MD 09/10/11 1610  Donnetta Hutching, MD 10/09/11 1538

## 2011-10-14 ENCOUNTER — Other Ambulatory Visit: Payer: Self-pay | Admitting: *Deleted

## 2011-10-14 ENCOUNTER — Other Ambulatory Visit: Payer: Self-pay | Admitting: Orthopedic Surgery

## 2011-10-14 DIAGNOSIS — M25569 Pain in unspecified knee: Secondary | ICD-10-CM

## 2011-10-14 MED ORDER — HYDROCODONE-ACETAMINOPHEN 5-325 MG PO TABS: 1.0000 | ORAL_TABLET | Freq: Four times a day (QID) | ORAL | Status: AC | PRN

## 2011-11-11 ENCOUNTER — Other Ambulatory Visit: Payer: Self-pay | Admitting: Cardiology

## 2011-11-11 MED ORDER — LISINOPRIL-HYDROCHLOROTHIAZIDE 20-12.5 MG PO TABS: 1.0000 | ORAL_TABLET | Freq: Two times a day (BID) | ORAL | Status: DC

## 2011-11-29 ENCOUNTER — Encounter: Payer: Self-pay | Admitting: *Deleted

## 2011-11-29 ENCOUNTER — Encounter: Payer: Self-pay | Admitting: Cardiology

## 2011-11-29 ENCOUNTER — Ambulatory Visit (INDEPENDENT_AMBULATORY_CARE_PROVIDER_SITE_OTHER): Payer: BC Managed Care – PPO | Admitting: Cardiology

## 2011-11-29 VITALS — BP 124/88 | HR 84 | Ht 67.0 in | Wt 166.1 lb

## 2011-11-29 DIAGNOSIS — I6529 Occlusion and stenosis of unspecified carotid artery: Secondary | ICD-10-CM

## 2011-11-29 DIAGNOSIS — I2510 Atherosclerotic heart disease of native coronary artery without angina pectoris: Secondary | ICD-10-CM

## 2011-11-29 DIAGNOSIS — I10 Essential (primary) hypertension: Secondary | ICD-10-CM

## 2011-11-29 DIAGNOSIS — E782 Mixed hyperlipidemia: Secondary | ICD-10-CM

## 2011-11-29 MED ORDER — SIMVASTATIN 40 MG PO TABS: 40.0000 mg | ORAL_TABLET | ORAL | Status: DC

## 2011-11-29 MED ORDER — LISINOPRIL-HYDROCHLOROTHIAZIDE 20-12.5 MG PO TABS: 1.0000 | ORAL_TABLET | Freq: Two times a day (BID) | ORAL | Status: DC

## 2011-11-29 NOTE — Assessment & Plan Note (Signed)
Due for followup Dopplers. Will be arranged.

## 2011-11-29 NOTE — Assessment & Plan Note (Signed)
Check FLP and LFT 

## 2011-11-29 NOTE — Assessment & Plan Note (Signed)
No angina with nonobstructive by prior assessment. Continue medical therapy and observation. Medication refills given.

## 2011-11-29 NOTE — Addendum Note (Signed)
Addended by: Reather Laurence A on: 11/29/2011 02:37 PM   Modules accepted: Orders

## 2011-11-29 NOTE — Assessment & Plan Note (Signed)
Blood pressure control good today.

## 2011-11-29 NOTE — Progress Notes (Signed)
   Clinical Summary Mr. Freas is a 56 y.o.male presenting for followup. He was seen in March. He reports no angina or increasing dyspnea. He did have arthroscopic left knee surgery since I saw him.  We reviewed his medications. He reports compliance, has not had followup carotid Dopplers or FLP yet.Marland Kitchen  He has not quit smoking, but has cut back. We discussed smoking cessation.   No Known Allergies  Current Outpatient Prescriptions  Medication Sig Dispense Refill  . aspirin EC 81 MG tablet Take 81 mg by mouth every morning.       . diphenhydrAMINE (BENADRYL) 25 MG tablet Take 25 mg by mouth every morning.      . fish oil-omega-3 fatty acids 1000 MG capsule Take 2 g by mouth. Take 2 tablets twice a day      . lisinopril-hydrochlorothiazide (PRINZIDE,ZESTORETIC) 20-12.5 MG per tablet Take 1 tablet by mouth 2 (two) times daily.  60 tablet  3  . ranitidine (ZANTAC) 150 MG capsule Take 300 mg by mouth every morning.       . simvastatin (ZOCOR) 40 MG tablet Take 1 tablet (40 mg total) by mouth every morning.  30 tablet  6    Past Medical History  Diagnosis Date  . Coronary artery disease     Nonobstructive  . Type 2 diabetes mellitus   . Mixed hyperlipidemia   . Essential hypertension, benign   . Carotid artery disease   . COPD (chronic obstructive pulmonary disease)   . GERD (gastroesophageal reflux disease)   . Hemorrhoids   . Diabetes mellitus     Social History Mr. Russom reports that he has been smoking Cigarettes.  He has a 7.5 pack-year smoking history. His smokeless tobacco use includes Chew. Mr. Si reports that he drinks alcohol.  Review of Systems States he may need knee replacement next year. No bleeding episodes. No palpitations.   Physical Examination Filed Vitals:   11/29/11 1311  BP: 124/88  Pulse: 84   Filed Weights   11/29/11 1311  Weight: 166 lb 1.8 oz (75.348 kg)   HEENT: Conjunctiva and lids normal, oropharynx with poor dentition.  Neck: Supple, no  elevated JVP or significant carotid bruits, no thyromegaly.  Lungs: Diminished breath sounds throughout, no wheezing, nonlabored.  Cardiac: Regular rate and rhythm, no S3 or significant systolic murmur.  Abdomen: Soft, nontender, bowel sounds present.  Skin: Warm and dry.  Musculoskeletal: No kyphosis.  Extremities: No pitting edema, distal pulses 1-2+. Brace on left knee. Neuropsychiatric: Alert and oriented x3, affect appropriate.     Problem List and Plan   CORONARY ATHEROSCLEROSIS NATIVE CORONARY ARTERY No angina with nonobstructive by prior assessment. Continue medical therapy and observation. Medication refills given.  CAROTID ARTERY DISEASE Due for followup Dopplers. Will be arranged.  ESSENTIAL HYPERTENSION, BENIGN Blood pressure control good today.  MIXED HYPERLIPIDEMIA Check FLP and LFT.    Jonelle Sidle, M.D., F.A.C.C.

## 2011-11-29 NOTE — Patient Instructions (Signed)
Your physician recommends that you schedule a follow-up appointment in: 6 months  Your physician has requested that you have a carotid duplex. This test is an ultrasound of the carotid arteries in your neck. It looks at blood flow through these arteries that supply the brain with blood. Allow one hour for this exam. There are no restrictions or special instructions.   Your physician recommends that you return for lab work in: Within the week  

## 2011-12-11 ENCOUNTER — Ambulatory Visit (HOSPITAL_COMMUNITY)
Admission: RE | Admit: 2011-12-11 | Discharge: 2011-12-11 | Disposition: A | Discharge status: Home / Self Care | Payer: BC Managed Care – PPO | Source: Ambulatory Visit | Attending: Cardiology | Admitting: Cardiology

## 2011-12-11 DIAGNOSIS — I10 Essential (primary) hypertension: Secondary | ICD-10-CM | POA: Insufficient documentation

## 2011-12-11 DIAGNOSIS — E119 Type 2 diabetes mellitus without complications: Secondary | ICD-10-CM | POA: Insufficient documentation

## 2011-12-11 DIAGNOSIS — E785 Hyperlipidemia, unspecified: Secondary | ICD-10-CM | POA: Insufficient documentation

## 2011-12-11 DIAGNOSIS — I6529 Occlusion and stenosis of unspecified carotid artery: Secondary | ICD-10-CM | POA: Insufficient documentation

## 2011-12-11 DIAGNOSIS — I2510 Atherosclerotic heart disease of native coronary artery without angina pectoris: Secondary | ICD-10-CM | POA: Insufficient documentation

## 2011-12-11 LAB — LIPID PANEL
Cholesterol: 186 mg/dL (ref 0–200)
HDL: 40 mg/dL (ref >39)
LDL Cholesterol: 73 mg/dL (ref 0–99)
Total CHOL/HDL Ratio: 4.7 Ratio
Triglycerides: 366 mg/dL — ABNORMAL HIGH (ref <150)
VLDL: 73 mg/dL — ABNORMAL HIGH (ref 0–40)

## 2011-12-11 LAB — HEPATIC FUNCTION PANEL
ALT: 19 U/L (ref 0–53)
AST: 16 U/L (ref 0–37)
Albumin: 4.2 g/dL (ref 3.5–5.2)
Alkaline Phosphatase: 80 U/L (ref 39–117)
Bilirubin, Direct: <0.1 mg/dL (ref 0.0–0.3)
Total Bilirubin: 0.2 mg/dL — ABNORMAL LOW (ref 0.3–1.2)
Total Protein: 6.5 g/dL (ref 6.0–8.3)

## 2011-12-12 ENCOUNTER — Encounter: Payer: Self-pay | Admitting: *Deleted

## 2011-12-16 ENCOUNTER — Telehealth: Payer: Self-pay | Admitting: Cardiology

## 2011-12-16 NOTE — Telephone Encounter (Signed)
Results mailed and called to patient

## 2011-12-16 NOTE — Telephone Encounter (Signed)
PT WIFE IS CALLING FOR TEST RESULTS/ CAROTID DONE LAST WEEK

## 2011-12-18 ENCOUNTER — Ambulatory Visit (INDEPENDENT_AMBULATORY_CARE_PROVIDER_SITE_OTHER): Payer: BC Managed Care – PPO | Admitting: Orthopedic Surgery

## 2011-12-18 ENCOUNTER — Encounter: Payer: Self-pay | Admitting: Orthopedic Surgery

## 2011-12-18 ENCOUNTER — Ambulatory Visit (INDEPENDENT_AMBULATORY_CARE_PROVIDER_SITE_OTHER): Payer: BC Managed Care – PPO

## 2011-12-18 VITALS — BP 128/80 | Ht 67.0 in | Wt 166.0 lb

## 2011-12-18 DIAGNOSIS — M25569 Pain in unspecified knee: Secondary | ICD-10-CM

## 2011-12-18 DIAGNOSIS — M1710 Unilateral primary osteoarthritis, unspecified knee: Secondary | ICD-10-CM

## 2011-12-18 DIAGNOSIS — IMO0002 Reserved for concepts with insufficient information to code with codable children: Secondary | ICD-10-CM

## 2011-12-18 MED ORDER — PREDNISONE 10 MG PO KIT: 10.0000 mg | PACK | ORAL | Status: DC

## 2011-12-18 MED ORDER — HYDROCODONE-ACETAMINOPHEN 10-325 MG PO TABS: 1.0000 | ORAL_TABLET | Freq: Four times a day (QID) | ORAL | Status: DC | PRN

## 2011-12-18 NOTE — Progress Notes (Signed)
Patient ID: Jimmy Duran, male   DOB: Jul 08, 1955, 56 y.o.   MRN: [id number]  Chief Complaint  Patient presents with  . 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 Grade 4 chondral lesion tibial plateau measuring 3 x 3 mm. Posterior horn degenerative meniscal tear.  Notch area normal  Lateral compartment, normal  Patellofemoral joint, normal  His pain is severe he is requiring a brace as well as Norco 10 mg. He's having fear that the knee will give out on him as well. The pain does radiate around the knee but is primarily medial.  Past Medical History  Diagnosis Date  . Coronary artery disease     Nonobstructive  . Type 2 diabetes mellitus   . Mixed hyperlipidemia   . Essential hypertension, benign   . Carotid artery disease   . COPD (chronic obstructive pulmonary disease)   . GERD (gastroesophageal reflux disease)   . Hemorrhoids   . Diabetes mellitus    BP 128/80  Ht 5\' 7"  (1.702 m)  Wt 166 lb (75.297 kg)  BMI 26.00 kg/m2 General appearance is normal, he is oriented x3 has a normal mood and affect his gait and station are remarkable for limp and a slight thrust. He has medial tenderness over the joint line severe, range of motion 125 does not appear feels stable. Strength normal. Skin normal. Pulse and temperature normal. No edema no swelling. Sensation normal. Reflexes normal.  X-rays show medial joint space narrowing and mild slight varus.  Impression. His knee is deteriorating from his chondral  lesions. We offered further nonoperative conservative treatment with injection, medication, bracing. He says he would like to have the knee replaced.  He does have some dental work which needs to be attended to prior to surgery loss of surgery next year in February. He can continue his Norco 10 mg for pain he has 5 refills  I'll see him in January to schedule February surgery

## 2011-12-18 NOTE — Patient Instructions (Addendum)
Preparing for Knee Replacement Recovery from knee replacement surgery can be made easier and more comfortable by taking steps to be prepared before surgery. This includes:  Arranging for others to help you.  Preparing your home.  Making sure your body is prepared by doing a pre-operative exam and being as healthy as you can.  Doing exercises before your surgery as told by your caregiver. Also, you can ease any concerns about your financial responsibilities by calling your insurance company after you decide to have surgery.In addition to your surgery and hospital stay, you will want to ask about your coverage for medical equipment, rehab facilities, and home care. ARRANGING FOR HELP  You will be getting stronger and more mobile every day. However, in the first couple weeks after surgery, it is unlikely you will be able to do all your daily activities as easily as before your surgery.You will tire easily and still have limited movement in your leg.Follow these guidelines to best arrange for the help you may need after your surgery:  Arrange for someone to drive you home from the hospital.Your surgeon will be able to tell you how many days you can expect to be in the hospital.  Cancel all work, care-giving, and volunteer responsibilities for at least 4 to 6 weeks after your surgery.  If you live alone, arrange for someone to care for your home and pets for the first 4 to 6 weeks.  Select someone with whom you feel comfortable to be with you day and night for the first week.This person will help you with your exercises and personal care, like bathing and using the toilet.  Arrange for drivers to bring you to and from your doctor and therapy appointments, as well as to the grocery store and other places you need to go, for at least 4 to 6 weeks.  Select 2 or 3 rehab facilities where you would be comfortable recovering just in case you are not able to go directly home to recover. PREPARING  YOUR HOME  Remove all clutter from your floors.  To see if you will be able to move in these spaces with a wheeled walker, hold your hands out about 6 inches from your sides. Then walk from your bed to the bathroom. Walk from your resting spot to your kitchen and bathroom.If you do not hit anything with your hands, you probably have enough room.  Remove throw rugs.  Move the items you use often to shelves and drawers that are at countertop height. Move items in your bathroom, kitchen, and bedroom.  Prepare a few meals that you can freeze and reheat later.  Do not plan on recovering in bed. It is better for your health to sit upright.You may wish to use a recliner with a small table nearby.Keep the items you use most frequently on that table. These may include the TV remote, a cordless phone, a book or laptop computer, water glass, and any other items of your choice.  Consider adding grab bars in the shower and near the toilet.  While you are in the hospital, you will learn about equipment helpful for your recovery.Some of the equipment includes raised toilet seats, tub benches, and shower benches.Often, your hospital care team will help you decide what you need and can direct you about where to buy these items.You may not need all of these items, and they are not often returnable, so it is not recommended that you buy them before going to the hospital.   to buy these items. You may not need all of these items, and they are not often returnable, so it is not recommended that you buy them before going to the hospital.  PREPARING YOUR BODY  Complete a pre-operative exam. This will ensure that your body is healthy enough to safely complete this surgery. Be certain to bring a complete list of all your medicines and supplements (herbs and vitamins). You may be advised to have additional tests to ensure your safety.   Complete elective dental care and routine cleanings before the surgery. Germs from anywhere in your body, including those in your mouth, can travel to your new joint and infect it.  It is important not to have any dental work  performed for at least 3 months after your surgery. After surgery, be sure to tell your dentist about your joint replacement.   Maintain a healthy diet. Unless advised by your surgeon, do not drastically change your diet before your surgery.   Quit smoking. Get help from your caregiver if you need it.   The day before your surgery, follow your surgeon's directions for showering, eating and drinking. These directions are for your safety.  EXERCISES Your caregiver may have you do the following exercises before your surgery. Be sure to follow the exercise program your caregiver prescribes for you. Doing the exercises on both sides will help prepare your "good" side as well. While completing these exercises, remember:    Stretch as long as you can, up to 30 seconds, without pain developing.   You should only feel a gentle lengthening or release in the stretched tissue.  Ankle Pumps  While sitting with your knees straight, draw the top of your feet upwards by flexing your ankles.    Then, reverse the motion, pointing your toes downward.   Repeat 10 to 20 times. Complete this exercise 1 to 2 times per day.  Heel Slides 1. Lie on your back with both knees straight. (If this causes back discomfort, bend your opposite knee, placing your foot flat on the floor.)  2. Slowly slide your heel back toward your buttocks until you feel a gentle stretch in the front of your knee or thigh.  3. Slowly slide your heel back to the starting position.  4. Repeat 10 to 20 times. Complete this exercise 1 to 2 times per day.  Quadriceps Sets 1. Lie on your back with your sore leg extended and your opposite knee bent.  2. Gradually tense the muscles in the front of your thigh. You should see either your kneecap slide up toward your hip or increased dimpling just above the knee. This motion will push the back of the knee down toward the floor.   3. Hold the muscle as tight as you can without increasing your pain for  10 seconds.  4. Relax the muscles slowly and completely in between each repetition.  5. Repeat 10 to 20 times. Complete this exercise 1 to 2 times per day.  Short Arc Kicks 1. Lie on your back. Place a 4 to 6 inch towel roll under your sore knee so that the knee slightly bends.  2. Raise only your lower leg by tightening the muscles in the front of your thigh. Do not allow your thigh to rise.  3. Hold this position for 5 seconds.  4. Repeat 10 to 20 times. Complete this exercise 1 to 2 times per day.  Straight Leg Raises 1. Lie on your back with your sore  leg extended and your opposite knee bent.   2. Tense the muscles in the front of your sore thigh. You should see either your kneecap slide up or increased dimpling just above the knee. Your thigh may even quiver.  3. Tighten these muscles even more and raise your leg 4 to 6 inches off the floor. Hold for 3 to 5 seconds.  4. Keeping these muscles tense, lower your leg.  5. Relax the muscles slowly and completely in between each repetition.  6. Repeat 10 to 20 times. Complete this exercise 1 to 2 times per day.  Arm Chair Push-ups 1. Find a firm, non-wheeled chair with solid armrests.  2. Sitting in the chair, extend your sore leg straight out in front of you.  3. Lift up your body weight, using your arms and opposite leg.  4. Slowly lower your body weight.   5. Repeat 10 to 20 times. Complete this exercise 1 to 2 times per day.  Document Released: 05/25/2010 Document Revised: 05/13/2011 Document Reviewed: 05/25/2010 [facility] Patient Information 2013 [place], [place].   Total Knee Replacement Total knee replacement is a procedure to replace your knee joint with an artificial knee joint (prosthetic knee joint). The purpose of this surgery is to reduce pain and improve your knee function. LET YOUR CAREGIVER KNOW ABOUT:    Any allergies you have.   Any medicines you are taking, including vitamins, herbs, eyedrops, over-the-counter medicines,  and creams.   Any problems you have had with the use of anesthetics.   Family history of problems with the use of anesthetics.   Any blood disorders you have, including bleeding problems or clotting problems.   Previous surgeries you have had.  RISKS AND COMPLICATIONS   Generally, total knee replacement is a safe procedure. However, as with any surgical procedure, complications can occur. Possible complications associated with total knee replacement include:  Loss of range of motion of the knee or instability.   Loosening of the prosthesis.   Infection.   Persistent pain.  BEFORE THE PROCEDURE    Your caregiver will instruct you when you need to stop eating and drinking.   Ask your caregiver if you need to change or stop any regular medicines.  PROCEDURE   Just before the procedure you will receive medicine that will make you drowsy (sedative). This will be given through a tube that is inserted into one of your veins (intravenous [IV] tube). Then you will either receive medicine to block pain from the waist down through your legs (spinal block) or medicine to also receive medicine to make you fall asleep (general anesthetic). You may also receive medicine to block feeling in your leg (nerve block) to help ease pain after surgery. An incision will be made in your knee. Your surgeon will take out any damaged cartilage and bone by sawing off the damaged surfaces. Then the surgeon will put a new metal liner over the sawed off portion of your thigh bone (femur) and a plastic liner over the sawed off portion of one of the bones of your lower leg (tibia). This is to restore alignment and function to your knee. A plastic piece is often used to restore the surface of your knee cap. AFTER THE PROCEDURE   You will be taken to the recovery area. You may have drainage tubes to drain excess fluid from your knee. These tubes attach to a device that removes these fluids. Once you are awake, stable, and  taking fluids well, you  will be taken to your hospital room. You will receive physical therapy as prescribed by your caregiver. The length of your stay in the hospital after a knee replacement is 2 4 days. Your surgeon may recommend that you spend time (usually an additional 10 14 days) in an extended-care facility to help you begin walking again and improve your range of motion before you go home. You may also be prescribed blood-thinning medicine to decrease your risk of developing blood clots in your leg. Document Released: 05/27/2000 Document Revised: 08/20/2011 Document Reviewed: 03/31/2011 [facility] Patient Information 2013 [place], [place].     You have been scheduled for surgery.  All surgeries carry some risk.  Remember you always have the option of continued nonsurgical treatment. However in this situation the risks vs. the benefits favor surgery as the best treatment option. The risks of the surgery includes the following but is not limited to bleeding, infection, pulmonary embolus, death from anesthesia, nerve injury vascular injury or need for further surgery, continued pain.  Specific to this procedure the following risks and complications are rare but possible Stiffness Pain  Infection which may require several subsequent surgeries including an amputation of the infection cannot be removed Instability   Plan on being out of work for 12 weeks

## 2011-12-24 ENCOUNTER — Emergency Department (HOSPITAL_COMMUNITY)
Admission: EM | Admit: 2011-12-24 | Discharge: 2011-12-24 | Disposition: A | Discharge status: Home / Self Care | Payer: BC Managed Care – PPO | Attending: Emergency Medicine | Admitting: Emergency Medicine

## 2011-12-24 ENCOUNTER — Encounter (HOSPITAL_COMMUNITY): Payer: Self-pay

## 2011-12-24 DIAGNOSIS — F17200 Nicotine dependence, unspecified, uncomplicated: Secondary | ICD-10-CM | POA: Insufficient documentation

## 2011-12-24 DIAGNOSIS — J449 Chronic obstructive pulmonary disease, unspecified: Secondary | ICD-10-CM | POA: Insufficient documentation

## 2011-12-24 DIAGNOSIS — I2510 Atherosclerotic heart disease of native coronary artery without angina pectoris: Secondary | ICD-10-CM | POA: Insufficient documentation

## 2011-12-24 DIAGNOSIS — R739 Hyperglycemia, unspecified: Secondary | ICD-10-CM

## 2011-12-24 DIAGNOSIS — I10 Essential (primary) hypertension: Secondary | ICD-10-CM | POA: Insufficient documentation

## 2011-12-24 DIAGNOSIS — Z7982 Long term (current) use of aspirin: Secondary | ICD-10-CM | POA: Insufficient documentation

## 2011-12-24 DIAGNOSIS — Z79899 Other long term (current) drug therapy: Secondary | ICD-10-CM | POA: Insufficient documentation

## 2011-12-24 DIAGNOSIS — Z8719 Personal history of other diseases of the digestive system: Secondary | ICD-10-CM | POA: Insufficient documentation

## 2011-12-24 DIAGNOSIS — J4489 Other specified chronic obstructive pulmonary disease: Secondary | ICD-10-CM | POA: Insufficient documentation

## 2011-12-24 DIAGNOSIS — E782 Mixed hyperlipidemia: Secondary | ICD-10-CM | POA: Insufficient documentation

## 2011-12-24 LAB — GLUCOSE, CAPILLARY
Glucose-Capillary: 423 mg/dL — ABNORMAL HIGH (ref 70–99)
Glucose-Capillary: 327 mg/dL — ABNORMAL HIGH (ref 70–99)

## 2011-12-24 MED ORDER — SODIUM CHLORIDE 0.9 % IV BOLUS (SEPSIS)
1000.0000 mL | Freq: Once | INTRAVENOUS | Status: AC
  Administered 2011-12-24: 1000 mL via INTRAVENOUS

## 2011-12-24 MED ORDER — GI COCKTAIL ~~LOC~~
30.0000 mL | Freq: Once | ORAL | Status: AC
  Administered 2011-12-24: 30 mL via ORAL
  Filled 2011-12-24: qty 30

## 2011-12-24 MED ORDER — INSULIN ASPART 100 UNIT/ML ~~LOC~~ SOLN
10.0000 [IU] | Freq: Once | SUBCUTANEOUS | Status: AC
  Administered 2011-12-24: 22:00:00 via SUBCUTANEOUS
  Filled 2011-12-24: qty 1

## 2011-12-24 MED ORDER — INSULIN REGULAR HUMAN 100 UNIT/ML IJ SOLN: 10.0000 [IU] | Freq: Once | INTRAMUSCULAR | Status: DC

## 2011-12-24 MED ORDER — METFORMIN HCL 500 MG PO TABS: 500.0000 mg | ORAL_TABLET | Freq: Every day | ORAL | Status: DC

## 2011-12-24 NOTE — ED Provider Notes (Signed)
History  This chart was scribed for Jimmy Lennert, MD by Bennett Scrape. This patient was seen in room APA19/APA19 and the patient's care was started at 9:23PM  CSN: [id number]  Arrival date & time 12/24/11  2055   First MD Initiated Contact with Patient 12/24/11 2123      Chief Complaint  Patient presents with  . Hyperglycemia     Patient is a 56 y.o. male presenting with weakness. The history is provided by the patient. No language interpreter was used.  Weakness Primary symptoms do not include headaches or seizures. The symptoms began 3 to 5 days ago. The symptoms are unchanged. The neurological symptoms are diffuse. Context: when sugar has been high.  Additional symptoms include weakness. Additional symptoms do not include hallucinations.   Jimmy Duran is a 56 y.o. male who presents to the Emergency Department complaining of non-changing, constant hyperglycemia described as reading "HIGH" on his meter tonight that he attributes to taking prednisone for his chronic left knee pain. He states that he has a h/o borderline diabetes and reports that his CBG levels usually stay within a 120 to 200 range unless he is on prednisone. He denies being on any diabetic medications currently. He reports that he is following up with Dr. Romeo Apple for his left knee and states that he is scheduled to have a total knee replacement. He denies dizziness, diaphoresis, CP, abdominal pain and nausea as associated symptoms. He also has a h/o CAD, HLD, HTN, and COPD. He is an everyday smoker and consumes alcohol socially.    Pt denies having a PCP currently Dr. Diona Browner is Cardiologist  Past Medical History  Diagnosis Date  . Coronary artery disease     Nonobstructive  . Type 2 diabetes mellitus   . Mixed hyperlipidemia   . Essential hypertension, benign   . Carotid artery disease   . COPD (chronic obstructive pulmonary disease)   . GERD (gastroesophageal reflux disease)   . Hemorrhoids   .  Diabetes mellitus     Past Surgical History  Procedure Date  . Appendectomy 1970  . Cholecystectomy 1993  . Umbilical hernia repair 1993  . Breast surgery     Cyst resection on the right  . Knee surgery     Left knee arthroscopy torn medial meniscus grade 4 chondral changes medial femoral condyle tibial plateau    Family History  Problem Relation Age of Onset  . Coronary artery disease Father     CABG in his 90's    History  Substance Use Topics  . Smoking status: Current Every Day Smoker -- 0.5 packs/day for 15 years    Types: Cigarettes  . Smokeless tobacco: Current User    Types: Chew  . Alcohol Use: Yes     Socially      Review of Systems  Constitutional: Negative for fatigue.  HENT: Negative for congestion, sinus pressure and ear discharge.   Eyes: Negative for discharge.  Respiratory: Negative for cough.   Cardiovascular: Negative for chest pain.  Gastrointestinal: Negative for abdominal pain and diarrhea.  Genitourinary: Negative for frequency and hematuria.  Musculoskeletal: Negative for back pain.       Positive for chronic left knee pain  Skin: Negative for rash.  Neurological: Positive for weakness. Negative for seizures and headaches.  Hematological: Negative.   Psychiatric/Behavioral: Negative for hallucinations and confusion.    Allergies  Review of patient's allergies indicates no known allergies.  Home Medications   Current Outpatient Rx  Name Route Sig Dispense Refill  . ASPIRIN EC 81 MG PO TBEC Oral Take 81 mg by mouth every morning.     Marland Kitchen DIPHENHYDRAMINE HCL 25 MG PO TABS Oral Take 25 mg by mouth every morning.    Marland Kitchen OMEGA-3 FATTY ACIDS 1000 MG PO CAPS Oral Take 2 g by mouth. Take 2 tablets twice a day    . HYDROCODONE-ACETAMINOPHEN 10-325 MG PO TABS Oral Take 1 tablet by mouth every 6 (six) hours as needed for pain. 60 tablet 5  . LISINOPRIL-HYDROCHLOROTHIAZIDE 20-12.5 MG PO TABS Oral Take 1 tablet by mouth 2 (two) times daily. 60 tablet  11  . PREDNISONE 10 MG PO KIT Oral Take 1 kit (10 mg total) by mouth as directed. 12 days ds as directed 1 kit 0  . RANITIDINE HCL 150 MG PO CAPS Oral Take 300 mg by mouth every morning.     Marland Kitchen SIMVASTATIN 40 MG PO TABS Oral Take 40 mg by mouth every morning.      Triage Vitals: BP 148/100  Pulse 104  Temp 97.9 F (36.6 C) (Oral)  Resp 18  Ht 5\' 7"  (1.702 m)  Wt 176 lb (79.833 kg)  BMI 27.57 kg/m2  SpO2 97%  Physical Exam  Nursing note and vitals reviewed. Constitutional: He is oriented to person, place, and time. He appears well-developed and well-nourished. No distress.  HENT:  Head: Normocephalic and atraumatic.  Eyes: Conjunctivae normal and EOM are normal. No scleral icterus.  Neck: Neck supple. No thyromegaly present.  Cardiovascular: Normal rate and regular rhythm.  Exam reveals no gallop and no friction rub.   No murmur heard. Pulmonary/Chest: Effort normal and breath sounds normal. No stridor. He has no wheezes. He has no rales. He exhibits no tenderness.  Abdominal: He exhibits no distension. There is no tenderness. There is no rebound.  Musculoskeletal: Normal range of motion. He exhibits edema.       Swelling to left knee with brace in place  Lymphadenopathy:    He has no cervical adenopathy.  Neurological: He is alert and oriented to person, place, and time. Coordination normal.  Skin: Skin is warm and dry. No rash noted. He is not diaphoretic. No erythema.  Psychiatric: He has a normal mood and affect. His behavior is normal.    ED Course  Procedures (including critical care time)  DIAGNOSTIC STUDIES: Oxygen Saturation is 97% on room air, normal  by my interpretation.    COORDINATION OF CARE: 9:25pm-Patient informed of current plan for treatment and evaluation and agrees with plan at this time.   9:30PM-Ordered 1,000 mL of Bolus  9:45PM-Ordered 10 unit injection of novolog  Labs Reviewed  GLUCOSE, CAPILLARY - Abnormal; Notable for the following:     Glucose-Capillary 423 (*)     All other components within normal limits  GLUCOSE, CAPILLARY - Abnormal; Notable for the following:    Glucose-Capillary 327 (*)     All other components within normal limits   No results found.   No diagnosis found.    MDM     The chart was scribed for me under my direct supervision.  I personally performed the history, physical, and medical decision making and all procedures in the evaluation of this patient.Jimmy Lennert, MD 12/24/11 [phone]

## 2011-12-24 NOTE — ED Notes (Signed)
Discharge instructions given and reviewed with patient.  Prescription given for Metformin; effects and use explained.  Patient requested names of family doctors in [place], names provided to patient and wife.  Patient ambulatory; discharged home in good condition.

## 2011-12-24 NOTE — ED Notes (Signed)
I am a borderline diabetic and they  Put me on a steroid and my blood sugar was "HI" on the meter this evening per pt.

## 2012-01-10 ENCOUNTER — Other Ambulatory Visit: Payer: Self-pay | Admitting: *Deleted

## 2012-01-10 MED ORDER — SIMVASTATIN 40 MG PO TABS: 40.0000 mg | ORAL_TABLET | Freq: Every morning | ORAL | Status: DC

## 2012-02-03 ENCOUNTER — Encounter: Payer: Self-pay | Admitting: Internal Medicine

## 2012-02-22 ENCOUNTER — Encounter (HOSPITAL_COMMUNITY): Payer: Self-pay | Admitting: *Deleted

## 2012-02-22 ENCOUNTER — Emergency Department (HOSPITAL_COMMUNITY)
Admission: EM | Admit: 2012-02-22 | Discharge: 2012-02-22 | Disposition: A | Discharge status: Home / Self Care | Payer: BC Managed Care – PPO | Attending: Emergency Medicine | Admitting: Emergency Medicine

## 2012-02-22 DIAGNOSIS — Z7982 Long term (current) use of aspirin: Secondary | ICD-10-CM | POA: Insufficient documentation

## 2012-02-22 DIAGNOSIS — I2510 Atherosclerotic heart disease of native coronary artery without angina pectoris: Secondary | ICD-10-CM | POA: Insufficient documentation

## 2012-02-22 DIAGNOSIS — Z79899 Other long term (current) drug therapy: Secondary | ICD-10-CM | POA: Insufficient documentation

## 2012-02-22 DIAGNOSIS — K029 Dental caries, unspecified: Secondary | ICD-10-CM | POA: Insufficient documentation

## 2012-02-22 DIAGNOSIS — J4489 Other specified chronic obstructive pulmonary disease: Secondary | ICD-10-CM | POA: Insufficient documentation

## 2012-02-22 DIAGNOSIS — K0889 Other specified disorders of teeth and supporting structures: Secondary | ICD-10-CM

## 2012-02-22 DIAGNOSIS — E782 Mixed hyperlipidemia: Secondary | ICD-10-CM | POA: Insufficient documentation

## 2012-02-22 DIAGNOSIS — F17200 Nicotine dependence, unspecified, uncomplicated: Secondary | ICD-10-CM | POA: Insufficient documentation

## 2012-02-22 DIAGNOSIS — K219 Gastro-esophageal reflux disease without esophagitis: Secondary | ICD-10-CM | POA: Insufficient documentation

## 2012-02-22 DIAGNOSIS — E119 Type 2 diabetes mellitus without complications: Secondary | ICD-10-CM | POA: Insufficient documentation

## 2012-02-22 DIAGNOSIS — K648 Other hemorrhoids: Secondary | ICD-10-CM | POA: Insufficient documentation

## 2012-02-22 DIAGNOSIS — I10 Essential (primary) hypertension: Secondary | ICD-10-CM | POA: Insufficient documentation

## 2012-02-22 DIAGNOSIS — J449 Chronic obstructive pulmonary disease, unspecified: Secondary | ICD-10-CM | POA: Insufficient documentation

## 2012-02-22 MED ORDER — HYDROCODONE-ACETAMINOPHEN 5-325 MG PO TABS: ORAL_TABLET | ORAL | Status: DC

## 2012-02-22 MED ORDER — NAPROXEN 250 MG PO TABS: 250.0000 mg | ORAL_TABLET | Freq: Two times a day (BID) | ORAL | Status: DC

## 2012-02-22 MED ORDER — PENICILLIN V POTASSIUM 250 MG PO TABS: 250.0000 mg | ORAL_TABLET | Freq: Four times a day (QID) | ORAL | Status: DC

## 2012-02-22 NOTE — ED Notes (Signed)
Right sided dental pain x 4 days.

## 2012-02-22 NOTE — ED Provider Notes (Signed)
History     CSN: [id number]  Arrival date & time 02/22/12  1401   First MD Initiated Contact with Patient 02/22/12 1442      Chief Complaint  Patient presents with  . Dental Pain     HPI Pt was seen at 1445.  Per pt, c/o gradual onset and persistence of constant right lower tooth "pain" for the past 4 days.  Denies fevers, no intra-oral edema, no rash, no facial swelling, no dysphagia, no neck pain.  States he has an appt in January with his dentist. The condition is aggravated by nothing. The condition is relieved by nothing. The symptoms have been associated with no other complaints.     Past Medical History  Diagnosis Date  . Coronary artery disease     Nonobstructive  . Type 2 diabetes mellitus   . Mixed hyperlipidemia   . Essential hypertension, benign   . Carotid artery disease   . COPD (chronic obstructive pulmonary disease)   . GERD (gastroesophageal reflux disease)   . Hemorrhoids   . Diabetes mellitus     Past Surgical History  Procedure Date  . Appendectomy 1970  . Cholecystectomy 1993  . Umbilical hernia repair 1993  . Breast surgery     Cyst resection on the right  . Knee surgery     Left knee arthroscopy torn medial meniscus grade 4 chondral changes medial femoral condyle tibial plateau    Family History  Problem Relation Age of Onset  . Coronary artery disease Father     CABG in his 73's    History  Substance Use Topics  . Smoking status: Current Every Day Smoker -- 0.5 packs/day for 15 years    Types: Cigarettes  . Smokeless tobacco: Current User    Types: Chew  . Alcohol Use: Yes     Comment: Socially    Review of Systems ROS: Statement: All systems negative except as marked or noted in the HPI; Constitutional: Negative for fever and chills. ; ; Eyes: Negative for eye pain and discharge. ; ; ENMT: Positive for dental caries, dental hygiene poor and toothache. Negative for ear pain, bleeding gums, dental injury, facial deformity, facial  swelling, hoarseness, nasal congestion, sinus pressure, sore throat, throat swelling and tongue swollen. ; ; Cardiovascular: Negative for chest pain, palpitations, diaphoresis, dyspnea and peripheral edema. ; ; Respiratory: Negative for cough, wheezing and stridor. ; ; Gastrointestinal: Negative for nausea, vomiting, diarrhea and abdominal pain. ; ; Genitourinary: Negative for dysuria, flank pain and hematuria. ; ; Musculoskeletal: Negative for back pain and neck pain. ; ; Skin: Negative for rash and skin lesion. ; ; Neuro: Negative for headache, lightheadedness and neck stiffness. ;    Allergies  Review of patient's allergies indicates no known allergies.  Home Medications   Current Outpatient Rx  Name  Route  Sig  Dispense  Refill  . ASPIRIN EC 81 MG PO TBEC   Oral   Take 81 mg by mouth every morning.          Marland Kitchen DIPHENHYDRAMINE HCL 25 MG PO TABS   Oral   Take 25 mg by mouth every morning.         Marland Kitchen OMEGA-3 FATTY ACIDS 1000 MG PO CAPS   Oral   Take 2 g by mouth. Take 2 tablets twice a day         . LISINOPRIL-HYDROCHLOROTHIAZIDE 20-12.5 MG PO TABS   Oral   Take 1 tablet by mouth 2 (two) times  daily.   60 tablet   11   . RANITIDINE HCL 150 MG PO CAPS   Oral   Take 300 mg by mouth every morning.          Marland Kitchen SIMVASTATIN 40 MG PO TABS   Oral   Take 1 tablet (40 mg total) by mouth every morning.   30 tablet   6   . HYDROCODONE-ACETAMINOPHEN 5-325 MG PO TABS      1 or 2 tabs PO q6 hours prn pain   20 tablet   0   . NAPROXEN 250 MG PO TABS   Oral   Take 1 tablet (250 mg total) by mouth 2 (two) times daily with a meal.   14 tablet   0   . PENICILLIN V POTASSIUM 250 MG PO TABS   Oral   Take 1 tablet (250 mg total) by mouth 4 (four) times daily.   20 tablet   0     BP 147/113  Pulse 109  Temp 98.3 F (36.8 C) (Oral)  Resp 20  Ht 5\' 7"  (1.702 m)  Wt 170 lb (77.111 kg)  BMI 26.63 kg/m2  SpO2 97%  Physical Exam 1450: Physical examination: Vital signs  and O2 SAT: Reviewed; Constitutional: Well developed, Well nourished, Well hydrated, In no acute distress; Head and Face: Normocephalic, Atraumatic; Eyes: EOMI, PERRL, No scleral icterus; ENMT: Mouth and pharynx normal, Poor dentition, Widespread dental decay, Left TM normal, Right TM normal, Mucous membranes moist. Multiple missing teeth. +lower right canine with extensive dental decay.  No gingival erythema, edema, fluctuance, or drainage.  No intra-oral edema. No hoarse voice, no drooling, no stridor.  ; Neck: Supple, Full range of motion, No lymphadenopathy; Cardiovascular: Regular rate and rhythm, No murmur, rub, or gallop; Respiratory: Breath sounds clear & equal bilaterally, No rales, rhonchi, wheezes, or rub, Normal respiratory effort/excursion; Chest: Nontender, Movement normal; Extremities: Pulses normal, No tenderness, No edema; Neuro: AA&Ox3, Major CN grossly intact.  No gross focal motor or sensory deficits in extremities.; Skin: Color normal, No rash, No petechiae, Warm, Dry   ED Course  Procedures     MDM  MDM Reviewed: nursing note and vitals      1500:  Pt encouraged to f/u with dentist or oral surgeon for his dental needs for good continuity of care and definitive treatment.  Verb understanding.        Laray Anger, DO 02/24/12 [phone]

## 2012-03-18 ENCOUNTER — Ambulatory Visit (INDEPENDENT_AMBULATORY_CARE_PROVIDER_SITE_OTHER): Payer: BC Managed Care – PPO | Admitting: Orthopedic Surgery

## 2012-03-18 VITALS — BP 124/78 | Ht 67.0 in | Wt 166.0 lb

## 2012-03-18 DIAGNOSIS — M25569 Pain in unspecified knee: Secondary | ICD-10-CM

## 2012-03-18 MED ORDER — HYDROCODONE-ACETAMINOPHEN 5-325 MG PO TABS: 1.0000 | ORAL_TABLET | ORAL | Status: DC | PRN

## 2012-03-18 NOTE — Patient Instructions (Addendum)
SURGERY LEFT TKA ON FEB 24TH

## 2012-03-18 NOTE — Progress Notes (Signed)
Patient ID: DECKER COGDELL, male   DOB: 12-Mar-1955, 57 y.o.   MRN: [id number] Chief Complaint  Patient presents with  . Follow-up    Recheck left knee to schedule knee surgery.    The patient is in the back this morning he would like to proceed with LEFT total knee replacement surgery. We discussed the surgery with him. I gave him a model. Several handouts discussed the procedure.  He, understands he'll have to be out of work for 2-3 months.  He understands risks and benefits of the surgery, which were discussed and included, but were not limited to, bleeding, infection, blood clot, stiffness, continued pain, anesthetic risk of death.  His pain is so severe he said he would like to move the surgery up financially just can't do it. Right now.  The surgery will be scheduled for 24 February. He'll come back to see me on February 14 just so I can check his mouth. He needs teeth removed prior to surgery.  Preoperative visit will be completed and is incorporated by reference near the time of surgery

## 2012-04-02 ENCOUNTER — Other Ambulatory Visit (HOSPITAL_COMMUNITY): Payer: BC Managed Care – PPO

## 2012-04-10 ENCOUNTER — Encounter: Payer: Self-pay | Admitting: Orthopedic Surgery

## 2012-04-13 ENCOUNTER — Encounter (HOSPITAL_COMMUNITY): Payer: Self-pay | Admitting: Pharmacy Technician

## 2012-04-14 ENCOUNTER — Telehealth: Payer: Self-pay | Admitting: Orthopedic Surgery

## 2012-04-14 NOTE — Telephone Encounter (Signed)
Contacted [employer] at ph# [phone], regarding in-patient/admit, surgery scheduled for 04/27/12 at [facility],  CPT code 82956, ICD9 code 715.96, 715.16, 719.46.  Lengthy hold time; opted to hold.

## 2012-04-14 NOTE — Telephone Encounter (Signed)
Reached pre-authorization department 04/14/12, [employer] [facility] representative, Everardo All, as per request for pre-certification:  Authorization # [id number] - received for 5 day-stay, [facility], starting with admit date 04/27/12.

## 2012-04-21 ENCOUNTER — Ambulatory Visit (INDEPENDENT_AMBULATORY_CARE_PROVIDER_SITE_OTHER): Payer: BC Managed Care – PPO | Admitting: Orthopedic Surgery

## 2012-04-21 VITALS — BP 150/92 | Ht 67.0 in | Wt 166.0 lb

## 2012-04-21 DIAGNOSIS — M1710 Unilateral primary osteoarthritis, unspecified knee: Secondary | ICD-10-CM

## 2012-04-21 DIAGNOSIS — IMO0002 Reserved for concepts with insufficient information to code with codable children: Secondary | ICD-10-CM

## 2012-04-21 NOTE — Patient Instructions (Signed)
Scheduled for a knee replacement.   All surgeries come with some risks. The risks associated with knee replacement surgery include but are not limited to:   Bleeding,  Infection,   Stiffness,   Continued pain,   Pulmonary embolus,   Deep vein thrombosis/blood clot.  Infection is a serious complication which may require multiple surgeries and in some cases although rare require amputation or fusion of the leg.  If these risks are not worth taking in light of the pain and/or functional loss you are having please let me know so that we can continue with an alternative treatment.  In preparation for surgery   Please stop any blood thinners and any antiinflammatories: (eg. warfarin, coumadin, ticlid, aspirin, plavix, clopidrel, motrin, advil, aleve, nabumatone, relafen. Diclofenac)

## 2012-04-21 NOTE — Progress Notes (Signed)
Patient ID: Jimmy Duran, male   DOB: [birth year], 57 y.o.   MRN: [id number] Chief Complaint  Patient presents with  . Follow-up    Recheck teeth prior to surgery    The patient is scheduled to have a left total knee arthroplasty. He's completed his dental work is on amoxicillin for approximately 2 more days.  His oral cavity looks clean without any abscess or infection  I answered his questions which related to his length of stay in the hospital approximately 3 days with a discharge date probably Thursday  He was concerned about his staples during his therapy time and I alleviated those concerns.  He will see [name] for surgery on Monday with a followup scheduled 2 weeks after

## 2012-04-22 ENCOUNTER — Encounter (HOSPITAL_COMMUNITY)
Admission: RE | Admit: 2012-04-22 | Discharge: 2012-04-22 | Disposition: A | Discharge status: Home / Self Care | Payer: BC Managed Care – PPO | Source: Ambulatory Visit | Attending: Orthopedic Surgery | Admitting: Orthopedic Surgery

## 2012-04-22 ENCOUNTER — Encounter (HOSPITAL_COMMUNITY): Payer: Self-pay

## 2012-04-22 ENCOUNTER — Other Ambulatory Visit (HOSPITAL_COMMUNITY): Payer: BC Managed Care – PPO

## 2012-04-22 HISTORY — DX: Unspecified osteoarthritis, unspecified site: M19.90

## 2012-04-22 HISTORY — DX: Cerebral infarction, unspecified: I63.9

## 2012-04-22 LAB — BASIC METABOLIC PANEL
BUN: 6 mg/dL (ref 6–23)
CO2: 28 mEq/L (ref 19–32)
Calcium: 9.2 mg/dL (ref 8.4–10.5)
Chloride: 100 mEq/L (ref 96–112)
Creatinine, Ser: 0.85 mg/dL (ref 0.50–1.35)
GFR calc Af Amer: >90 mL/min (ref >90)
GFR calc non Af Amer: >90 mL/min (ref >90)
Glucose, Bld: 301 mg/dL — ABNORMAL HIGH (ref 70–99)
Potassium: 4.1 mEq/L (ref 3.5–5.1)
Sodium: 136 mEq/L (ref 135–145)

## 2012-04-22 LAB — HEMOGLOBIN AND HEMATOCRIT, BLOOD
HCT: 40.7 % (ref 39.0–52.0)
Hemoglobin: 14.6 g/dL (ref 13.0–17.0)

## 2012-04-22 LAB — SURGICAL PCR SCREEN
MRSA, PCR: NEGATIVE
Staphylococcus aureus: POSITIVE — AB

## 2012-04-22 LAB — PROTIME-INR
INR: 1.01 (ref 0.00–1.49)
Prothrombin Time: 13.2 seconds (ref 11.6–15.2)

## 2012-04-22 LAB — APTT: aPTT: 31 seconds (ref 24–37)

## 2012-04-22 NOTE — Progress Notes (Signed)
Results for LORENA, CLEARMAN (MRN [id number]) as of 04/22/2012 13:10  Ref. Range 04/22/2012 10:27 04/22/2012 10:47  Sodium Latest Range: 135-145 mEq/L  136  Potassium Latest Range: 3.5-5.1 mEq/L  4.1  Chloride Latest Range: 96-112 mEq/L  100  CO2 Latest Range: 19-32 mEq/L  28  BUN Latest Range: 6-23 mg/dL  6  Creatinine Latest Range: 0.50-1.35 mg/dL  7.82  Calcium Latest Range: 8.4-10.5 mg/dL  9.2  GFR calc non Af Amer Latest Range: >90 mL/min  >90  GFR calc Af Amer Latest Range: >90 mL/min  >90  Glucose Latest Range: 70-99 mg/dL  956 (H)  Hemoglobin Latest Range: 13.0-17.0 g/dL  21.3  HCT Latest Range: 39.0-52.0 %  40.7  Prothrombin Time Latest Range: 11.6-15.2 seconds  13.2  INR Latest Range: 0.00-1.49   1.01  aPTT Latest Range: 24-37 seconds  31  MRSA, PCR Latest Range: NEGATIVE  NEGATIVE   Staphylococcus aureus Latest Range: NEGATIVE  POSITIVE (A)

## 2012-04-22 NOTE — Patient Instructions (Addendum)
Jimmy Duran [facility]  04/22/2012   Your procedure is scheduled on:  04/27/2012  Report to [facility] at  615  AM.  Call this number if you have problems the morning of surgery: [phone]   Remember:   Do not eat food or drink liquids after midnight.   Take these medicines the morning of surgery with A SIP OF WATER:  Norco,lisinopril,zantac,benadryl   Do not wear jewelry, make-up or nail polish.  Do not wear lotions, powders, or perfumes.   Do not shave 48 hours prior to surgery. Men may shave face and neck.  Do not bring valuables to the hospital.  Contacts, dentures or bridgework may not be worn into surgery.  Leave suitcase in the car. After surgery it may be brought to your room.  For patients admitted to the hospital, checkout time is 11:00 AM the day of discharge.   Patients discharged the day of surgery will not be allowed to drive  home.  Name and phone number of your driver: family  Special Instructions: Shower using CHG 2 nights before surgery and the night before surgery.  If you shower the day of surgery use CHG.  Use special wash - you have one bottle of CHG for all showers.  You should use approximately 1/3 of the bottle for each shower.   Please read over the following fact sheets that you were given: Pain Booklet, Coughing and Deep Breathing, Total Joint Packet, MRSA Information, Surgical Site Infection Prevention, Anesthesia Post-op Instructions and Care and Recovery After Surgery Total Knee Replacement Total knee replacement is a procedure to replace your knee joint with an artificial knee joint (prosthetic knee joint). The purpose of this surgery is to reduce pain and improve your knee function. LET YOUR CAREGIVER KNOW ABOUT:   Any allergies you have.  Any medicines you are taking, including vitamins, herbs, eyedrops, over-the-counter medicines, and creams.  Any problems you have had with the use of anesthetics.  Family history of problems with the use of  anesthetics.  Any blood disorders you have, including bleeding problems or clotting problems.  Previous surgeries you have had. RISKS AND COMPLICATIONS  Generally, total knee replacement is a safe procedure. However, as with any surgical procedure, complications can occur. Possible complications associated with total knee replacement include:  Loss of range of motion of the knee or instability.  Loosening of the prosthesis.  Infection.  Persistent pain. BEFORE THE PROCEDURE   Your caregiver will instruct you when you need to stop eating and drinking.  Ask your caregiver if you need to change or stop any regular medicines. PROCEDURE  Just before the procedure you will receive medicine that will make you drowsy (sedative). This will be given through a tube that is inserted into one of your veins (intravenous [IV] tube). Then you will either receive medicine to block pain from the waist down through your legs (spinal block) or medicine to also receive medicine to make you fall asleep (general anesthetic). You may also receive medicine to block feeling in your leg (nerve block) to help ease pain after surgery. An incision will be made in your knee. Your surgeon will take out any damaged cartilage and bone by sawing off the damaged surfaces. Then the surgeon will put a new metal liner over the sawed off portion of your thigh bone (femur) and a plastic liner over the sawed off portion of one of the bones of your lower leg (tibia). This is to  restore alignment and function to your knee. A plastic piece is often used to restore the surface of your knee cap. AFTER THE PROCEDURE  You will be taken to the recovery area. You may have drainage tubes to drain excess fluid from your knee. These tubes attach to a device that removes these fluids. Once you are awake, stable, and taking fluids well, you will be taken to your hospital room. You will receive physical therapy as prescribed by your caregiver. The  length of your stay in the hospital after a knee replacement is 2 4 days. Your surgeon may recommend that you spend time (usually an additional 10 14 days) in an extended-care facility to help you begin walking again and improve your range of motion before you go home. You may also be prescribed blood-thinning medicine to decrease your risk of developing blood clots in your leg. Document Released: 05/27/2000 Document Revised: 08/20/2011 Document Reviewed: 03/31/2011 [facility] Patient Information 2013 [place], [place]. PATIENT INSTRUCTIONS POST-ANESTHESIA  IMMEDIATELY FOLLOWING SURGERY:  Do not drive or operate machinery for the first twenty four hours after surgery.  Do not make any important decisions for twenty four hours after surgery or while taking narcotic pain medications or sedatives.  If you develop intractable nausea and vomiting or a severe headache please notify your doctor immediately.  FOLLOW-UP:  Please make an appointment with your surgeon as instructed. You do not need to follow up with anesthesia unless specifically instructed to do so.  WOUND CARE INSTRUCTIONS (if applicable):  Keep a dry clean dressing on the anesthesia/puncture wound site if there is drainage.  Once the wound has quit draining you may leave it open to air.  Generally you should leave the bandage intact for twenty four hours unless there is drainage.  If the epidural site drains for more than 36-48 hours please call the anesthesia department.  QUESTIONS?:  Please feel free to call your physician or the hospital operator if you have any questions, and they will be happy to assist you.

## 2012-04-22 NOTE — Progress Notes (Signed)
04/22/12 1041  OBSTRUCTIVE SLEEP APNEA  Have you ever been diagnosed with sleep apnea through a sleep study? No  If yes, do you have and use a CPAP or BPAP machine every night? 0  Do you snore loudly (loud enough to be heard through closed doors)?  1  Do you often feel tired, fatigued, or sleepy during the daytime? 0  Has anyone observed you stop breathing during your sleep? 1  Do you have, or are you being treated for high blood pressure? 1  BMI more than 35 kg/m2? 0  Age over 26 years old? 1  Neck circumference greater than 40 cm/18 inches? 0  Gender: 1  Obstructive Sleep Apnea Score 5

## 2012-04-24 MED ORDER — MUPIROCIN 2 % EX OINT
TOPICAL_OINTMENT | CUTANEOUS | Status: AC
  Filled 2012-04-24: qty 22

## 2012-04-26 ENCOUNTER — Encounter (HOSPITAL_COMMUNITY): Payer: Self-pay | Admitting: Orthopedic Surgery

## 2012-04-26 NOTE — H&P (Addendum)
TOTAL KNEE ADMISSION H&P  Patient is being admitted for left total knee arthroplasty.  Subjective:  Chief Complaint:left knee pain.  HPI: Jimmy Duran, 57 y.o. male, has a history of pain and functional disability in the left knee due to arthritis and has failed non-surgical conservative treatments for greater than 12 weeks to includeNSAID's and/or analgesics, corticosteriod injections, flexibility and strengthening excercises, use of assistive devices and activity modification.  Onset of symptoms was gradual, starting several years ago with gradually worsening course since that time. The patient noted prior procedures on the knee to include  arthroscopy and menisectomy on the left knee(s).  Patient currently rates pain in the left knee(s) at 7 out of 10 with activity. Patient has night pain, worsening of pain with activity and weight bearing, pain that interferes with activities of daily living, pain with passive range of motion, crepitus and joint swelling.  Patient has evidence of subchondral sclerosis, periarticular osteophytes and joint space narrowing by imaging studies. This patient has had arthroscopic menisectomy. There is no active infection.  Patient Active Problem List   Diagnosis Date Noted  . Patellar tendinitis of left knee 08/07/2011  . H/O arthroscopy of left knee 07/09/2011  . Medial meniscus, posterior horn derangement 06/17/2011  . OA (osteoarthritis) of knee 06/17/2011  . Headache 11/16/2010  . GERD (gastroesophageal reflux disease) 05/04/2010  . Diabetes mellitus 05/04/2010  . COPD (chronic obstructive pulmonary disease) 05/04/2010  . Hemorrhoids 05/04/2010  . MIXED HYPERLIPIDEMIA 04/25/2010  . Tobacco use disorder 04/25/2010  . ESSENTIAL HYPERTENSION, BENIGN 04/25/2010  . CORONARY ATHEROSCLEROSIS NATIVE CORONARY ARTERY 04/25/2010  . CAROTID ARTERY DISEASE 04/25/2010  . CHEST PAIN UNSPECIFIED 04/25/2010   Past Medical History  Diagnosis Date  . Coronary artery  disease     Nonobstructive  . Type 2 diabetes mellitus   . Mixed hyperlipidemia   . Essential hypertension, benign   . Carotid artery disease   . COPD (chronic obstructive pulmonary disease)   . GERD (gastroesophageal reflux disease)   . Hemorrhoids   . Diabetes mellitus   . Stroke     "ministrokes"  . Arthritis     Past Surgical History  Procedure Laterality Date  . Appendectomy  1970  . Cholecystectomy  1993  . Umbilical hernia repair  1993  . Breast surgery      Cyst resection on the right  . Knee surgery      Left knee arthroscopy torn medial meniscus grade 4 chondral changes medial femoral condyle tibial plateau    No prescriptions prior to admission   No Known Allergies  History  Substance Use Topics  . Smoking status: Current Every Day Smoker -- 0.50 packs/day for 30 years    Types: Cigarettes  . Smokeless tobacco: Current User    Types: Chew  . Alcohol Use: Yes     Comment: Socially    Family History  Problem Relation Age of Onset  . Coronary artery disease Father     CABG in his 19's     Review of Systems  Constitutional: Negative.   HENT: Negative.   Eyes: Negative.   Respiratory: Positive for cough.   Cardiovascular: Negative.   Gastrointestinal: Positive for heartburn.  Genitourinary: Negative.   Musculoskeletal: Positive for myalgias and joint pain.  Neurological: Negative.   Endo/Heme/Allergies: Negative.   Psychiatric/Behavioral: Negative.     Objective:  Physical Exam  Nursing note and vitals reviewed. Constitutional: He is oriented to person, place, and time. He appears well-developed and well-nourished.  HENT:  Head: Normocephalic.  Recent dental surgery to extract cavities looks clear   Eyes: Pupils are equal, round, and reactive to light. No scleral icterus.  Neck: Normal range of motion. Neck supple. No JVD present. No tracheal deviation present. No thyromegaly present.  Cardiovascular: Normal rate and intact distal pulses.    Respiratory: Effort normal. No stridor.  GI: Soft. He exhibits no distension.  Musculoskeletal:       Right knee: Normal.  Upper extremity exam  The right and left upper extremity:  Inspection and palpation revealed no abnormalities in the upper extremities.  Range of motion is full without contracture. Motor exam is normal with grade 5 strength. The joints are fully reduced without subluxation. There is no atrophy or tremor and muscle tone is normal.  All joints are stable.    Gait normal   Lymphadenopathy:    He has no cervical adenopathy.  Neurological: He is alert and oriented to person, place, and time. He has normal reflexes. He displays normal reflexes. No cranial nerve deficit. He exhibits normal muscle tone. Coordination normal.  Skin: Skin is warm and dry. No rash noted. No erythema. No pallor.  Psychiatric: He has a normal mood and affect. His behavior is normal. Judgment and thought content normal.    Vital signs in last 24 hours:    Labs:   Estimated body mass index is 25.99 kg/(m^2) as calculated from the following:   Height as of 02/22/12: 5\' 7"  (1.702 m).   Weight as of 03/18/12: 75.297 kg (166 lb).   Imaging Review Plain radiographs demonstrate moderate degenerative joint disease of the left knee(s). The overall alignment ismild varus. The bone quality appears to be excellent for age and reported activity level.  Assessment/Plan:  End stage arthritis, left knee   The patient history, physical examination, clinical judgment of the provider and imaging studies are consistent with end stage degenerative joint disease of the left knee(s) and total knee arthroplasty is deemed medically necessary. The treatment options including medical management, injection therapy arthroscopy and arthroplasty were discussed at length. The risks and benefits of total knee arthroplasty were presented and reviewed. The risks due to aseptic loosening, infection, stiffness, patella  tracking problems, thromboembolic complications and other imponderables were discussed. The patient acknowledged the explanation, agreed to proceed with the plan and consent was signed. Patient is being admitted for inpatient treatment for surgery, pain control, PT, OT, prophylactic antibiotics, VTE prophylaxis, progressive ambulation and ADL's and discharge planning. The patient is planning to be discharged home with home health services  Planned procedure is LEFT TKA

## 2012-04-27 ENCOUNTER — Inpatient Hospital Stay (HOSPITAL_COMMUNITY): Payer: BC Managed Care – PPO | Admitting: Anesthesiology

## 2012-04-27 ENCOUNTER — Encounter (HOSPITAL_COMMUNITY)
Admission: RE | Disposition: A | Discharge status: Home Health | Payer: Self-pay | Source: Ambulatory Visit | Attending: Orthopedic Surgery

## 2012-04-27 ENCOUNTER — Encounter (HOSPITAL_COMMUNITY): Payer: Self-pay | Admitting: *Deleted

## 2012-04-27 ENCOUNTER — Inpatient Hospital Stay (HOSPITAL_COMMUNITY)
Admission: RE | Admit: 2012-04-27 | Discharge: 2012-04-30 | DRG: 209 | Disposition: A | Discharge status: Home Health | Payer: BC Managed Care – PPO | Source: Ambulatory Visit | Attending: Orthopedic Surgery | Admitting: Orthopedic Surgery

## 2012-04-27 ENCOUNTER — Inpatient Hospital Stay (HOSPITAL_COMMUNITY): Payer: BC Managed Care – PPO

## 2012-04-27 ENCOUNTER — Encounter (HOSPITAL_COMMUNITY): Payer: Self-pay | Admitting: Anesthesiology

## 2012-04-27 DIAGNOSIS — E782 Mixed hyperlipidemia: Secondary | ICD-10-CM | POA: Diagnosis present

## 2012-04-27 DIAGNOSIS — M1710 Unilateral primary osteoarthritis, unspecified knee: Secondary | ICD-10-CM

## 2012-04-27 DIAGNOSIS — Z8673 Personal history of transient ischemic attack (TIA), and cerebral infarction without residual deficits: Secondary | ICD-10-CM

## 2012-04-27 DIAGNOSIS — E119 Type 2 diabetes mellitus without complications: Secondary | ICD-10-CM | POA: Diagnosis present

## 2012-04-27 DIAGNOSIS — I2510 Atherosclerotic heart disease of native coronary artery without angina pectoris: Secondary | ICD-10-CM | POA: Diagnosis present

## 2012-04-27 DIAGNOSIS — Z23 Encounter for immunization: Secondary | ICD-10-CM

## 2012-04-27 DIAGNOSIS — J4489 Other specified chronic obstructive pulmonary disease: Secondary | ICD-10-CM | POA: Diagnosis present

## 2012-04-27 DIAGNOSIS — F17200 Nicotine dependence, unspecified, uncomplicated: Secondary | ICD-10-CM | POA: Diagnosis present

## 2012-04-27 DIAGNOSIS — IMO0002 Reserved for concepts with insufficient information to code with codable children: Principal | ICD-10-CM | POA: Diagnosis present

## 2012-04-27 DIAGNOSIS — I10 Essential (primary) hypertension: Secondary | ICD-10-CM | POA: Diagnosis present

## 2012-04-27 DIAGNOSIS — R51 Headache: Secondary | ICD-10-CM | POA: Diagnosis present

## 2012-04-27 DIAGNOSIS — J449 Chronic obstructive pulmonary disease, unspecified: Secondary | ICD-10-CM | POA: Diagnosis present

## 2012-04-27 DIAGNOSIS — K219 Gastro-esophageal reflux disease without esophagitis: Secondary | ICD-10-CM | POA: Diagnosis present

## 2012-04-27 DIAGNOSIS — M7650 Patellar tendinitis, unspecified knee: Secondary | ICD-10-CM | POA: Diagnosis present

## 2012-04-27 HISTORY — PX: TOTAL KNEE ARTHROPLASTY: SHX125

## 2012-04-27 LAB — GLUCOSE, CAPILLARY: Glucose-Capillary: 164 mg/dL — ABNORMAL HIGH (ref 70–99)

## 2012-04-27 SURGERY — ARTHROPLASTY, KNEE, TOTAL
Anesthesia: Spinal | Site: Knee | Laterality: Left | Wound class: Clean

## 2012-04-27 SURGERY — ARTHROPLASTY, KNEE, TOTAL
Anesthesia: Spinal | Laterality: Left

## 2012-04-27 MED ORDER — LISINOPRIL 10 MG PO TABS
20.0000 mg | ORAL_TABLET | Freq: Two times a day (BID) | ORAL | Status: DC
  Administered 2012-04-27 – 2012-04-29 (×5): 20 mg via ORAL
  Filled 2012-04-27 (×5): qty 2

## 2012-04-27 MED ORDER — CEFAZOLIN SODIUM-DEXTROSE 2-3 GM-% IV SOLR
INTRAVENOUS | Status: AC
  Filled 2012-04-27: qty 50

## 2012-04-27 MED ORDER — ACETAMINOPHEN 10 MG/ML IV SOLN
1000.0000 mg | Freq: Four times a day (QID) | INTRAVENOUS | Status: DC
  Administered 2012-04-27 – 2012-04-28 (×3): 1000 mg via INTRAVENOUS
  Filled 2012-04-27 (×4): qty 100

## 2012-04-27 MED ORDER — STERILE WATER FOR IRRIGATION IR SOLN
Status: DC | PRN
  Administered 2012-04-27: 2000 mL

## 2012-04-27 MED ORDER — INFLUENZA VIRUS VACC SPLIT PF IM SUSP
0.5000 mL | INTRAMUSCULAR | Status: AC
  Filled 2012-04-27: qty 0.5

## 2012-04-27 MED ORDER — OXYCODONE HCL 5 MG PO TABS
5.0000 mg | ORAL_TABLET | Freq: Once | ORAL | Status: AC
  Administered 2012-04-27: 5 mg via ORAL

## 2012-04-27 MED ORDER — FENTANYL CITRATE 0.05 MG/ML IJ SOLN
INTRAMUSCULAR | Status: DC | PRN
  Administered 2012-04-27: 25 ug via INTRAVENOUS

## 2012-04-27 MED ORDER — PROPOFOL 10 MG/ML IV EMUL
INTRAVENOUS | Status: AC
  Filled 2012-04-27: qty 20

## 2012-04-27 MED ORDER — MIDAZOLAM HCL 5 MG/5ML IJ SOLN
INTRAMUSCULAR | Status: DC | PRN
  Administered 2012-04-27: 2 mg via INTRAVENOUS

## 2012-04-27 MED ORDER — OMEGA-3 FATTY ACIDS 1000 MG PO CAPS: 2.0000 g | ORAL_CAPSULE | Freq: Two times a day (BID) | ORAL | Status: DC

## 2012-04-27 MED ORDER — METFORMIN HCL 500 MG PO TABS
500.0000 mg | ORAL_TABLET | Freq: Two times a day (BID) | ORAL | Status: DC
  Administered 2012-04-27 – 2012-04-30 (×6): 500 mg via ORAL
  Filled 2012-04-27 (×6): qty 1

## 2012-04-27 MED ORDER — CEFAZOLIN SODIUM-DEXTROSE 2-3 GM-% IV SOLR
INTRAVENOUS | Status: DC | PRN
  Administered 2012-04-27: 2 g via INTRAVENOUS

## 2012-04-27 MED ORDER — BUPIVACAINE-EPINEPHRINE (PF) 0.5% -1:200000 IJ SOLN
INTRAMUSCULAR | Status: DC | PRN
  Administered 2012-04-27 (×2): 30 mL

## 2012-04-27 MED ORDER — CEFAZOLIN SODIUM 1-5 GM-% IV SOLN
1.0000 g | Freq: Four times a day (QID) | INTRAVENOUS | Status: AC
  Administered 2012-04-27: 1 g via INTRAVENOUS
  Filled 2012-04-27: qty 50

## 2012-04-27 MED ORDER — FENTANYL CITRATE 0.05 MG/ML IJ SOLN
INTRAMUSCULAR | Status: DC | PRN
  Administered 2012-04-27 (×3): 25 ug via INTRAVENOUS

## 2012-04-27 MED ORDER — HYDROCHLOROTHIAZIDE 12.5 MG PO CAPS
12.5000 mg | ORAL_CAPSULE | Freq: Two times a day (BID) | ORAL | Status: DC
  Administered 2012-04-27 – 2012-04-29 (×5): 12.5 mg via ORAL
  Filled 2012-04-27 (×5): qty 1

## 2012-04-27 MED ORDER — METHOCARBAMOL 100 MG/ML IJ SOLN
500.0000 mg | Freq: Once | INTRAVENOUS | Status: DC
  Administered 2012-04-27: 500 mg via INTRAVENOUS
  Filled 2012-04-27: qty 5

## 2012-04-27 MED ORDER — METOCLOPRAMIDE HCL 5 MG/ML IJ SOLN: 5.0000 mg | Freq: Three times a day (TID) | INTRAMUSCULAR | Status: DC | PRN

## 2012-04-27 MED ORDER — PROPOFOL INFUSION 10 MG/ML OPTIME
INTRAVENOUS | Status: DC | PRN
  Administered 2012-04-27: 25 ug/kg/min via INTRAVENOUS
  Administered 2012-04-27: 45 ug/kg/min via INTRAVENOUS

## 2012-04-27 MED ORDER — POTASSIUM CHLORIDE IN NACL 20-0.9 MEQ/L-% IV SOLN
INTRAVENOUS | Status: DC
  Administered 2012-04-27 – 2012-04-29 (×5): via INTRAVENOUS

## 2012-04-27 MED ORDER — SIMVASTATIN 20 MG PO TABS
40.0000 mg | ORAL_TABLET | Freq: Every day | ORAL | Status: DC
  Administered 2012-04-27 – 2012-04-28 (×2): 40 mg via ORAL
  Filled 2012-04-27 (×2): qty 2

## 2012-04-27 MED ORDER — BUPIVACAINE IN DEXTROSE 0.75-8.25 % IT SOLN
INTRATHECAL | Status: AC
  Filled 2012-04-27: qty 2

## 2012-04-27 MED ORDER — PNEUMOCOCCAL VAC POLYVALENT 25 MCG/0.5ML IJ INJ
0.5000 mL | INJECTION | INTRAMUSCULAR | Status: AC
  Filled 2012-04-27: qty 0.5

## 2012-04-27 MED ORDER — PHENOL 1.4 % MT LIQD: 1.0000 | OROMUCOSAL | Status: DC | PRN

## 2012-04-27 MED ORDER — ONDANSETRON HCL 4 MG PO TABS: 4.0000 mg | ORAL_TABLET | Freq: Four times a day (QID) | ORAL | Status: DC | PRN

## 2012-04-27 MED ORDER — SODIUM CHLORIDE 0.9 % IR SOLN
Status: DC | PRN
  Administered 2012-04-27: 1000 mL

## 2012-04-27 MED ORDER — CELECOXIB 100 MG PO CAPS
ORAL_CAPSULE | ORAL | Status: AC
  Filled 2012-04-27: qty 4

## 2012-04-27 MED ORDER — CEFAZOLIN SODIUM 1-5 GM-% IV SOLN
1.0000 g | Freq: Four times a day (QID) | INTRAVENOUS | Status: DC
  Administered 2012-04-27: 1 g via INTRAVENOUS
  Filled 2012-04-27 (×2): qty 50

## 2012-04-27 MED ORDER — MENTHOL 3 MG MT LOZG: 1.0000 | LOZENGE | OROMUCOSAL | Status: DC | PRN

## 2012-04-27 MED ORDER — LACTATED RINGERS IV SOLN: INTRAVENOUS | Status: DC

## 2012-04-27 MED ORDER — CELECOXIB 100 MG PO CAPS
200.0000 mg | ORAL_CAPSULE | Freq: Two times a day (BID) | ORAL | Status: DC
  Administered 2012-04-28 – 2012-04-29 (×4): 200 mg via ORAL
  Filled 2012-04-27 (×4): qty 2

## 2012-04-27 MED ORDER — ONDANSETRON HCL 4 MG/2ML IJ SOLN
4.0000 mg | Freq: Once | INTRAMUSCULAR | Status: AC
  Administered 2012-04-27: 4 mg via INTRAVENOUS

## 2012-04-27 MED ORDER — MIDAZOLAM HCL 2 MG/2ML IJ SOLN
1.0000 mg | INTRAMUSCULAR | Status: DC | PRN
  Administered 2012-04-27: 2 mg via INTRAVENOUS

## 2012-04-27 MED ORDER — SODIUM CHLORIDE 0.9 % IR SOLN
Status: DC | PRN
  Administered 2012-04-27: 3000 mL

## 2012-04-27 MED ORDER — FAMOTIDINE 20 MG PO TABS
20.0000 mg | ORAL_TABLET | Freq: Every day | ORAL | Status: DC
  Administered 2012-04-28 – 2012-04-29 (×2): 20 mg via ORAL
  Filled 2012-04-27 (×2): qty 1

## 2012-04-27 MED ORDER — DIPHENHYDRAMINE HCL 25 MG PO CAPS
25.0000 mg | ORAL_CAPSULE | Freq: Every morning | ORAL | Status: DC
  Administered 2012-04-27 – 2012-04-29 (×3): 25 mg via ORAL
  Filled 2012-04-27 (×3): qty 1

## 2012-04-27 MED ORDER — CELECOXIB 100 MG PO CAPS
400.0000 mg | ORAL_CAPSULE | Freq: Once | ORAL | Status: AC
  Administered 2012-04-27: 400 mg via ORAL

## 2012-04-27 MED ORDER — FENTANYL CITRATE 0.05 MG/ML IJ SOLN: 25.0000 ug | INTRAMUSCULAR | Status: DC | PRN

## 2012-04-27 MED ORDER — DEXTROSE 5 % IV SOLN
500.0000 mg | Freq: Four times a day (QID) | INTRAVENOUS | Status: DC
  Filled 2012-04-27 (×14): qty 5

## 2012-04-27 MED ORDER — FENTANYL CITRATE 0.05 MG/ML IJ SOLN
INTRAMUSCULAR | Status: AC
  Filled 2012-04-27: qty 2

## 2012-04-27 MED ORDER — ACETAMINOPHEN 10 MG/ML IV SOLN
1000.0000 mg | Freq: Once | INTRAVENOUS | Status: AC
  Administered 2012-04-27: 1000 mg via INTRAVENOUS

## 2012-04-27 MED ORDER — MIDAZOLAM HCL 2 MG/2ML IJ SOLN
INTRAMUSCULAR | Status: AC
  Filled 2012-04-27: qty 2

## 2012-04-27 MED ORDER — ACETAMINOPHEN 10 MG/ML IV SOLN
INTRAVENOUS | Status: AC
  Filled 2012-04-27: qty 100

## 2012-04-27 MED ORDER — ALUM & MAG HYDROXIDE-SIMETH 200-200-20 MG/5ML PO SUSP: 30.0000 mL | ORAL | Status: DC | PRN

## 2012-04-27 MED ORDER — SIMVASTATIN 20 MG PO TABS: 40.0000 mg | ORAL_TABLET | Freq: Every morning | ORAL | Status: DC

## 2012-04-27 MED ORDER — LISINOPRIL-HYDROCHLOROTHIAZIDE 20-12.5 MG PO TABS: 1.0000 | ORAL_TABLET | Freq: Two times a day (BID) | ORAL | Status: DC

## 2012-04-27 MED ORDER — ASPIRIN EC 325 MG PO TBEC
325.0000 mg | DELAYED_RELEASE_TABLET | Freq: Two times a day (BID) | ORAL | Status: DC
  Administered 2012-04-27 – 2012-04-29 (×6): 325 mg via ORAL
  Filled 2012-04-27 (×6): qty 1

## 2012-04-27 MED ORDER — OXYCODONE HCL 5 MG PO TABS
5.0000 mg | ORAL_TABLET | ORAL | Status: DC
  Administered 2012-04-27 – 2012-04-28 (×5): 5 mg via ORAL
  Filled 2012-04-27 (×5): qty 1

## 2012-04-27 MED ORDER — HYDROMORPHONE HCL PF 1 MG/ML IJ SOLN
0.5000 mg | INTRAMUSCULAR | Status: DC | PRN
  Administered 2012-04-27 – 2012-04-28 (×3): 0.5 mg via INTRAVENOUS
  Filled 2012-04-27 (×3): qty 1

## 2012-04-27 MED ORDER — OXYCODONE HCL 5 MG PO TABS
ORAL_TABLET | ORAL | Status: AC
  Filled 2012-04-27: qty 1

## 2012-04-27 MED ORDER — METHOCARBAMOL 500 MG PO TABS
500.0000 mg | ORAL_TABLET | Freq: Four times a day (QID) | ORAL | Status: DC
  Administered 2012-04-27 – 2012-04-29 (×10): 500 mg via ORAL
  Filled 2012-04-27 (×10): qty 1

## 2012-04-27 MED ORDER — OMEGA-3-ACID ETHYL ESTERS 1 G PO CAPS
2.0000 g | ORAL_CAPSULE | Freq: Two times a day (BID) | ORAL | Status: DC
  Administered 2012-04-27 – 2012-04-29 (×6): 2 g via ORAL
  Filled 2012-04-27 (×6): qty 2
  Filled 2012-04-27: qty 1

## 2012-04-27 MED ORDER — PREGABALIN 50 MG PO CAPS
50.0000 mg | ORAL_CAPSULE | Freq: Once | ORAL | Status: AC
  Administered 2012-04-27: 50 mg via ORAL

## 2012-04-27 MED ORDER — PREGABALIN 50 MG PO CAPS
ORAL_CAPSULE | ORAL | Status: AC
  Filled 2012-04-27: qty 1

## 2012-04-27 MED ORDER — CEFAZOLIN SODIUM-DEXTROSE 2-3 GM-% IV SOLR: 2.0000 g | INTRAVENOUS | Status: DC

## 2012-04-27 MED ORDER — ONDANSETRON HCL 4 MG/2ML IJ SOLN
4.0000 mg | Freq: Four times a day (QID) | INTRAMUSCULAR | Status: DC | PRN
  Administered 2012-04-29: 4 mg via INTRAVENOUS
  Filled 2012-04-27: qty 2

## 2012-04-27 MED ORDER — ONDANSETRON HCL 4 MG/2ML IJ SOLN
INTRAMUSCULAR | Status: AC
  Filled 2012-04-27: qty 2

## 2012-04-27 MED ORDER — CEFAZOLIN SODIUM 1-5 GM-% IV SOLN
INTRAVENOUS | Status: AC
  Filled 2012-04-27: qty 50

## 2012-04-27 MED ORDER — METOCLOPRAMIDE HCL 10 MG PO TABS: 5.0000 mg | ORAL_TABLET | Freq: Three times a day (TID) | ORAL | Status: DC | PRN

## 2012-04-27 MED ORDER — LIDOCAINE HCL 1 % IJ SOLN
INTRAMUSCULAR | Status: DC | PRN
  Administered 2012-04-27: 50 mg via INTRADERMAL

## 2012-04-27 MED ORDER — LACTATED RINGERS IV SOLN
INTRAVENOUS | Status: DC
  Administered 2012-04-27 (×2): via INTRAVENOUS

## 2012-04-27 MED ORDER — ONDANSETRON HCL 4 MG/2ML IJ SOLN: 4.0000 mg | Freq: Once | INTRAMUSCULAR | Status: DC | PRN

## 2012-04-27 MED ORDER — MUPIROCIN 2 % EX OINT
TOPICAL_OINTMENT | CUTANEOUS | Status: AC
  Filled 2012-04-27: qty 22

## 2012-04-27 MED ORDER — BUPIVACAINE-EPINEPHRINE PF 0.5-1:200000 % IJ SOLN
INTRAMUSCULAR | Status: AC
  Filled 2012-04-27: qty 30

## 2012-04-27 SURGICAL SUPPLY — 67 items
BAG HAMPER (MISCELLANEOUS) ×2 IMPLANT
BANDAGE ESMARK 6X9 LF (GAUZE/BANDAGES/DRESSINGS) ×1 IMPLANT
BLADE HEX COATED 2.75 (ELECTRODE) ×2 IMPLANT
BLADE SAG 18X100X1.27 (BLADE) ×1 IMPLANT
BLADE SAGITTAL 25.0X1.27X90 (BLADE) ×2 IMPLANT
BLADE SAW SAG 90X13X1.27 (BLADE) ×2 IMPLANT
BNDG CMPR 9X6 STRL LF SNTH (GAUZE/BANDAGES/DRESSINGS) ×1
BNDG ESMARK 6X9 LF (GAUZE/BANDAGES/DRESSINGS) ×2
CEMENT HV SMART SET (Cement) ×4 IMPLANT
CLOTH BEACON ORANGE TIMEOUT ST (SAFETY) ×2 IMPLANT
COVER LIGHT HANDLE STERIS (MISCELLANEOUS) ×4 IMPLANT
COVER PROBE W GEL 5X96 (DRAPES) ×2 IMPLANT
CUFF TOURNIQUET SINGLE 34IN LL (TOURNIQUET CUFF) ×1 IMPLANT
DECANTER SPIKE VIAL GLASS SM (MISCELLANEOUS) ×4 IMPLANT
DRAPE BACK TABLE (DRAPES) ×2 IMPLANT
DRAPE EXTREMITY T 121X128X90 (DRAPE) ×2 IMPLANT
DRSG MEPILEX BORDER 4X12 (GAUZE/BANDAGES/DRESSINGS) ×2 IMPLANT
DURAPREP 26ML APPLICATOR (WOUND CARE) ×3 IMPLANT
ELECT REM PT RETURN 9FT ADLT (ELECTROSURGICAL) ×2
ELECTRODE REM PT RTRN 9FT ADLT (ELECTROSURGICAL) ×1 IMPLANT
EVACUATOR 3/16  PVC DRAIN (DRAIN) ×1
EVACUATOR 3/16 PVC DRAIN (DRAIN) ×1 IMPLANT
FACESHIELD LNG OPTICON STERILE (SAFETY) ×1 IMPLANT
GLOVE BIOGEL PI IND STRL 7.0 (GLOVE) IMPLANT
GLOVE BIOGEL PI IND STRL 8 (GLOVE) IMPLANT
GLOVE BIOGEL PI INDICATOR 7.0 (GLOVE) ×2
GLOVE BIOGEL PI INDICATOR 8 (GLOVE) ×1
GLOVE ECLIPSE 6.5 STRL STRAW (GLOVE) ×1 IMPLANT
GLOVE EXAM NITRILE MD LF STRL (GLOVE) ×3 IMPLANT
GLOVE OPTIFIT SS 8.0 STRL (GLOVE) ×2 IMPLANT
GLOVE SKINSENSE NS SZ8.0 LF (GLOVE) ×2
GLOVE SKINSENSE STRL SZ8.0 LF (GLOVE) ×2 IMPLANT
GLOVE SS BIOGEL STRL SZ 6.5 (GLOVE) IMPLANT
GLOVE SS BIOGEL STRL SZ 8 (GLOVE) IMPLANT
GLOVE SS N UNI LF 8.5 STRL (GLOVE) ×2 IMPLANT
GLOVE SUPERSENSE BIOGEL SZ 6.5 (GLOVE) ×1
GLOVE SUPERSENSE BIOGEL SZ 8 (GLOVE) ×1
GOWN STRL REIN XL XLG (GOWN DISPOSABLE) ×8 IMPLANT
HANDPIECE INTERPULSE COAX TIP (DISPOSABLE) ×2
HOOD W/PEELAWAY (MISCELLANEOUS) ×8 IMPLANT
INST SET MAJOR BONE (KITS) ×2 IMPLANT
IV NS IRRIG 3000ML ARTHROMATIC (IV SOLUTION) ×2 IMPLANT
KIT BLADEGUARD II DBL (SET/KITS/TRAYS/PACK) ×2 IMPLANT
KIT ROOM TURNOVER APOR (KITS) ×2 IMPLANT
MANIFOLD NEPTUNE II (INSTRUMENTS) ×2 IMPLANT
MARKER SKIN DUAL TIP RULER LAB (MISCELLANEOUS) ×2 IMPLANT
NDL HYPO 21X1.5 SAFETY (NEEDLE) ×1 IMPLANT
NEEDLE HYPO 21X1.5 SAFETY (NEEDLE) ×2 IMPLANT
NS IRRIG 1000ML POUR BTL (IV SOLUTION) ×2 IMPLANT
PACK TOTAL JOINT (CUSTOM PROCEDURE TRAY) ×2 IMPLANT
PAD ARMBOARD 7.5X6 YLW CONV (MISCELLANEOUS) ×2 IMPLANT
PAD DANNIFLEX CPM (ORTHOPEDIC SUPPLIES) ×2 IMPLANT
PADDING CAST COTTON 6X4 STRL (CAST SUPPLIES) ×1 IMPLANT
PIN TROCAR 3 INCH (PIN) ×2 IMPLANT
SET BASIN LINEN APH (SET/KITS/TRAYS/PACK) ×2 IMPLANT
SET HNDPC FAN SPRY TIP SCT (DISPOSABLE) ×1 IMPLANT
SPONGE GAUZE 4X4 12PLY (GAUZE/BANDAGES/DRESSINGS) IMPLANT
STAPLER VISISTAT 35W (STAPLE) ×2 IMPLANT
SUT BRALON NAB BRD #1 30IN (SUTURE) ×4 IMPLANT
SUT MON AB 0 CT1 (SUTURE) ×3 IMPLANT
SYR 30ML LL (SYRINGE) ×2 IMPLANT
SYR BULB IRRIGATION 50ML (SYRINGE) ×2 IMPLANT
TOWEL OR 17X26 4PK STRL BLUE (TOWEL DISPOSABLE) ×2 IMPLANT
TOWER CARTRIDGE SMART MIX (DISPOSABLE) ×2 IMPLANT
TRAY FOLEY CATH 14FR (SET/KITS/TRAYS/PACK) ×2 IMPLANT
WATER STERILE IRR 1000ML POUR (IV SOLUTION) ×6 IMPLANT
YANKAUER SUCT 12FT TUBE ARGYLE (SUCTIONS) ×2 IMPLANT

## 2012-04-27 NOTE — Evaluation (Signed)
Physical Therapy Evaluation Patient Details Name: Jimmy Duran MRN: [id number] DOB: Mar 22, 1955 Today's Date: 04/27/2012 Time: 4098-1191 PT Time Calculation (min): 50 min  PT Assessment / Plan / Recommendation Clinical Impression  Pt was seen for initial eval/tx following L TKR.  He is extremely pleasant and cooperative with good control of pain.  There iis no visible knee edema and ROM of L knee is 5-82 deg, AA.  We initiated ther ex per protocol and he is almost independent with SAQ.  He was instructed in gait with walker, WBAT L and was able to ambulate 80' !  His wife is at home 24/7 and will be able to assist him at home.  He will need a RW and BSC at d/c as well as HHPT.  He should have an excellent outcome from this surgery.    PT Assessment  Patient needs continued PT services    Follow Up Recommendations  Home health PT    Does the patient have the potential to tolerate intense rehabilitation      Barriers to Discharge None      Equipment Recommendations  Rolling walker with 5" wheels (BSC)    Recommendations for Other Services     Frequency 7X/week    Precautions / Restrictions Precautions Precautions: Knee Precaution Booklet Issued:  (TKR booklet issued) Restrictions Weight Bearing Restrictions: No   Pertinent Vitals/Pain       Mobility  Bed Mobility Bed Mobility: Supine to Sit Supine to Sit: 4: Min assist;HOB elevated Details for Bed Mobility Assistance: min assist needed to move LLE out of bed Transfers Transfers: Sit to Stand;Stand to Sit Sit to Stand: 5: Supervision;With upper extremity assist;From bed Stand to Sit: 5: Supervision;To chair/3-in-1;With upper extremity assist Ambulation/Gait Ambulation/Gait Assistance: 5: Supervision Ambulation Distance (Feet): 80 Feet Assistive device: Rolling walker Ambulation/Gait Assistance Details: pt is PWB L Gait Pattern: Step-through pattern;Decreased hip/knee flexion - left;Decreased step length -  left Stairs: No Wheelchair Mobility Wheelchair Mobility: No    Exercises Total Joint Exercises Ankle Circles/Pumps: AROM;Both;10 reps;Supine Quad Sets: AROM;Both;10 reps;Supine Short Arc Quad: AAROM;Left;10 reps;Supine Heel Slides: AAROM;Left;10 reps;Supine Goniometric ROM: 5-82 deg, AA   PT Diagnosis: Difficulty walking;Abnormality of gait;Acute pain  PT Problem List: Decreased strength;Decreased range of motion;Decreased activity tolerance;Decreased mobility;Decreased knowledge of use of DME;Decreased safety awareness;Decreased knowledge of precautions;Pain PT Treatment Interventions: DME instruction;Gait training;Stair training;Therapeutic activities;Functional mobility training;Therapeutic exercise;Patient/family education   PT Goals Acute Rehab PT Goals PT Goal Formulation: With patient Time For Goal Achievement: 05/04/12 Potential to Achieve Goals: Good Pt will go Supine/Side to Sit: with modified independence PT Goal: Supine/Side to Sit - Progress: Goal set today Pt will go Sit to Supine/Side: with modified independence PT Goal: Sit to Supine/Side - Progress: Goal set today Pt will go Sit to Stand: with modified independence PT Goal: Sit to Stand - Progress: Goal set today Pt will Ambulate: 51 - 150 feet;with supervision;with rolling walker PT Goal: Ambulate - Progress: Goal set today Pt will Go Up / Down Stairs: 1-2 stairs;with min assist;with rolling walker PT Goal: Up/Down Stairs - Progress: Goal set today Pt will Perform Home Exercise Program: with supervision, verbal cues required/provided PT Goal: Perform Home Exercise Program - Progress: Goal set today  Visit Information  Last PT Received On: 04/27/12    Subjective Data  Subjective: I'm feeling pretty good Patient Stated Goal: return to work...no pain   Prior Functioning  Home Living Lives With: Spouse;Family Available Help at Discharge: Family;Available 24 hours/day Type of Home:  House Home Access: Stairs  to enter [employer] of Steps: 2 Entrance Stairs-Rails: None Home Layout: One level Bathroom Shower/Tub: Engineer, manufacturing systems: Standard Home Adaptive Equipment: None Prior Function Level of Independence: Independent Able to Take Stairs?: Yes Driving: Yes Vocation: Full time employment 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 Communication Communication: No difficulties    Cognition  Cognition Overall Cognitive Status: Appears within functional limits for tasks assessed/performed Arousal/Alertness: Awake/alert Orientation Level: Appears intact for tasks assessed Behavior During Session: [facility] for tasks performed    Extremity/Trunk Assessment Right Lower Extremity Assessment RLE ROM/Strength/Tone: Within functional levels RLE Sensation: WFL - Light Touch RLE Coordination: WFL - gross motor Left Lower Extremity Assessment LLE ROM/Strength/Tone: Deficits LLE ROM/Strength/Tone Deficits: ROM of knee AA= 5-82 deg LLE Sensation: WFL - Light Touch Trunk Assessment Trunk Assessment: Normal   Balance Balance Balance Assessed:  (WNL by functional observation)  End of Session PT - End of Session Equipment Utilized During Treatment: Gait belt Activity Tolerance: Patient tolerated treatment well Patient left: in chair;with call bell/phone within reach Nurse Communication: Mobility status  GP     Konrad Penta 04/27/2012, 4:17 PM

## 2012-04-27 NOTE — Progress Notes (Signed)
Awake. Denies pain. Lt pedal pulse palpable. Moves lt toes without difficulty. Heels off bed.

## 2012-04-27 NOTE — Op Note (Signed)
04/27/2012  9:43 AM  PATIENT:  Jimmy Duran  56 y.o. male  PRE-OPERATIVE DIAGNOSIS:  Osteoarthritis left knee  POST-OPERATIVE DIAGNOSIS:  Osteoarthritis left knee  Operative findings severe grade 4 chondral changes medial femoral condyle mild osteophytes minimal deformity.  PROCEDURE:  Procedure(s): TOTAL KNEE ARTHROPLASTY (Left)  Implants Depew stigma fixed-bearing posterior stabilized 4 femur, 3 tibia, 38 patella, 10 mm insert.  Indications for procedure disabling knee pain, failure to control pain with nonoperative measures Details of procedure:  In the preop area the patient's left  knee was marked and countersigned by the surgeon, the chart was updated, consent was signed  The patient was taken to the operating room for spinal anesthetic followed by administering 2 g of Ancef based on weight of <80 kg  A Foley catheter was inserted sterilely, then the operative extremity left was prepped and draped sterilely  The timeout was completed  The limb was then exsanguinated with a six-inch Esmarch with the knee in flexion and the tourniquet was elevated to 300 mm of mercury. A midline incision was made, the subcutaneous tissues were divided down to the extensor mechanism. A medial arthrotomy was performed, the patella was everted,  the fat pad was resected. The medial and  lateral menisci were resected. The medial soft tissue sleeve was elevated to the mid coronal plane. The anterior cruciate ligament and PCL were resected. Osteophytes were removed. The distal femur anterior surface was skeletonized with sharp dissection.  A three-eighths inch drill bit was used to enter the femoral canal which was suctioned and irrigated until the fluid was clear;  an intramedullary rod was placed in the femur with a 5 , left 10 mm  setting, the block was pinned in place; then an 10mm distal femoral resection was performed. The cut was checked for flatness.  The sizing guide was then placed on the  femur, the femur measured a size 4; the block was pinned in external rotation 3 using the epicondyles as reference; a  4-in-1 cutting block was placed and the 4 cuts were made with retractors protecting the collateral ligaments. The posterior osteophytes were removed with a curved osteotome. Residual PCL tissue was resected; residual meniscal tissue was resected.  The external tibial alignment instrument was set for tibial resection. The guide was   placed referencing the medial side which was the worn side and the stylus was set at 2 mm resection. Anterior slope was built in to match the patients anatomy; a neutral varus valgus cut was set using the medial third of the tibial tubercle as reference along with the medial portion of the lateral tibial spine. Theguide was stabilized with pins.  A saw was used to resect the anterior tibia. The tibia was sized to a size 3 base plate.  We then placed spacer blocks using a  10 and a 12.5 spacer block;  the knee was balanced  in extension and was balanced in flexion with the 10 spacer block . Lamina spreaders was placed with the kneein flexion and extension and the gaps measured 25 mm  The box cut was then done using the box cutting guide. We then turned our attention to the patella.  The patella measured 23 mm we set the guide to leave 14 mm of patella.  After resection of the patella,  It was remeasured, and measured 14 mm. The size was 38. We then drilled the 3 peg holes.  We then did a trial reduction. The trial reduction was excellent  with full extension, balanced in extension, balance in flexion. Passive flexion equalled 125, patella normal tracking.   We then punched the tibia per technique.   The bone was then irrigated and dried while cement was mixed on the back table. The implants were checked for accuracy and then cemented in place; excess cement was removed; the cement was allowed to cure. The wound was then irrigated with copious amounts of saline,  the posterior capsule was injected with 30 cc of Marcaine with epinephrine followed by 30 cc in the soft tissue. The 10mm insert was placed. Range of motion matched trial reduction  The capsule was closed with #1 Bralon in interrupted and running fashion and then the joint was injected with 60 cc of Marcaine with epinephrine.  The subcutaneous tissues were closed with  0 Monocryl  in running fashion  Staples were used to reapproximate the skin edges.  Sterile dressings were applied.  The patient was then taken to the recovery room in stable condition.  Routine postop plan for knee replacement.     SURGEON:  Surgeon(s) and Role:    * Vickki Hearing, MD - Primary  PHYSICIAN ASSISTANT:   ASSISTANTS: betty ashley and wayne mcfatter    ANESTHESIA:   spinal  EBL:  Total I/O In: 1400 [I.V.:1400] Out: 500 [Urine:500]  BLOOD ADMINISTERED:none  DRAINS: (1) Hemovact drain(s) in the joint with  Suction Off   LOCAL MEDICATIONS USED:  MARCAINE   , Amount: 60 ml and OTHER epi  SPECIMEN:  No Specimen  DISPOSITION OF SPECIMEN:  N/A  COUNTS:  YES  TOURNIQUET:   Total Tourniquet Time Documented: Thigh (Left) - 87 minutes Total: Thigh (Left) - 87 minutes   DICTATION: .Reubin Milan Dictation  PLAN OF CARE: Admit to inpatient   PATIENT DISPOSITION:  PACU - hemodynamically stable.   Delay start of Pharmacological VTE agent (>24hrs) due to surgical blood loss or risk of bleeding: yes

## 2012-04-27 NOTE — Anesthesia Postprocedure Evaluation (Signed)
  Anesthesia Post-op Note  Patient: Jimmy Duran [facility]  Procedure(s) Performed: Procedure(s): TOTAL KNEE ARTHROPLASTY (Left)  Patient Location: PACU  Anesthesia Type:Spinal  Level of Consciousness: awake, alert , oriented and patient cooperative  Airway and Oxygen Therapy: Patient Spontanous Breathing and Patient connected to nasal cannula oxygen  Post-op Pain: 3 /10, mild  Post-op Assessment: Post-op Vital signs reviewed, Patient's Cardiovascular Status Stable, Respiratory Function Stable, Patent Airway, No signs of Nausea or vomiting and Pain level controlled  Post-op Vital Signs: Reviewed and stable  Complications: No apparent anesthesia complications

## 2012-04-27 NOTE — Interval H&P Note (Signed)
History and Physical Interval Note:  04/27/2012 7:13 AM  Jimmy Duran  has presented today for surgery, with the diagnosis of Osteoarthritis left knee  The various methods of treatment have been discussed with the patient and family. After consideration of risks, benefits and other options for treatment, the patient has consented to  Procedure(s): TOTAL KNEE ARTHROPLASTY (Left) as a surgical intervention .  The patient's history has been reviewed, patient examined, no change in status, stable for surgery.  I have reviewed the patient's chart and labs.  Questions were answered to the patient's satisfaction.      LEFT TOTAL KNEE [name]

## 2012-04-27 NOTE — Brief Op Note (Addendum)
04/27/2012  9:43 AM  PATIENT:  Jimmy Duran  56 y.o. male  PRE-OPERATIVE DIAGNOSIS:  Osteoarthritis left knee  POST-OPERATIVE DIAGNOSIS:  Osteoarthritis left knee  Operative findings severe grade 4 chondral changes medial femoral condyle mild osteophytes minimal deformity.  PROCEDURE:  Procedure(s): TOTAL KNEE ARTHROPLASTY (Left)  Implants Depew stigma fixed-bearing posterior stabilized 4 femur, 3 tibia, 38 patella, 10 mm insert.  Indications for procedure disabling knee pain, failure to control pain with nonoperative measures Details of procedure:  In the preop area the patient's left  knee was marked and countersigned by the surgeon, the chart was updated, consent was signed  The patient was taken to the operating room for spinal anesthetic followed by administering 2 g of Ancef based on weight of <80 kg  A Foley catheter was inserted sterilely, then the operative extremity left was prepped and draped sterilely  The timeout was completed  The limb was then exsanguinated with a six-inch Esmarch with the knee in flexion and the tourniquet was elevated to 300 mm of mercury. A midline incision was made, the subcutaneous tissues were divided down to the extensor mechanism. A medial arthrotomy was performed, the patella was everted,  the fat pad was resected. The medial and  lateral menisci were resected. The medial soft tissue sleeve was elevated to the mid coronal plane. The anterior cruciate ligament and PCL were resected. Osteophytes were removed. The distal femur anterior surface was skeletonized with sharp dissection.  A three-eighths inch drill bit was used to enter the femoral canal which was suctioned and irrigated until the fluid was clear;  an intramedullary rod was placed in the femur with a 5 , left 10 mm  setting, the block was pinned in place; then an 10mm distal femoral resection was performed. The cut was checked for flatness.  The sizing guide was then placed on the  femur, the femur measured a size 4; the block was pinned in external rotation 3 using the epicondyles as reference; a  4-in-1 cutting block was placed and the 4 cuts were made with retractors protecting the collateral ligaments. The posterior osteophytes were removed with a curved osteotome. Residual PCL tissue was resected; residual meniscal tissue was resected.  The external tibial alignment instrument was set for tibial resection. The guide was   placed referencing the medial side which was the worn side and the stylus was set at 2 mm resection. Anterior slope was built in to match the patients anatomy; a neutral varus valgus cut was set using the medial third of the tibial tubercle as reference along with the medial portion of the lateral tibial spine. Theguide was stabilized with pins.  A saw was used to resect the anterior tibia. The tibia was sized to a size 3 base plate.  We then placed spacer blocks using a  10 and a 12.5 spacer block;  the knee was balanced  in extension and was balanced in flexion with the 10 spacer block . Lamina spreaders was placed with the kneein flexion and extension and the gaps measured 25 mm  The box cut was then done using the box cutting guide. We then turned our attention to the patella.  The patella measured 23 mm we set the guide to leave 14 mm of patella.  After resection of the patella,  It was remeasured, and measured 14 mm. The size was 38. We then drilled the 3 peg holes.  We then did a trial reduction. The trial reduction was excellent   with full extension, balanced in extension, balance in flexion. Passive flexion equalled 125, patella normal tracking.   We then punched the tibia per technique.   The bone was then irrigated and dried while cement was mixed on the back table. The implants were checked for accuracy and then cemented in place; excess cement was removed; the cement was allowed to cure. The wound was then irrigated with copious amounts of saline,  the posterior capsule was injected with 30 cc of Marcaine with epinephrine followed by 30 cc in the soft tissue. The 10mm insert was placed. Range of motion matched trial reduction  The capsule was closed with #1 Bralon in interrupted and running fashion and then the joint was injected with 60 cc of Marcaine with epinephrine.  The subcutaneous tissues were closed with  0 Monocryl  in running fashion  Staples were used to reapproximate the skin edges.  Sterile dressings were applied.  The patient was then taken to the recovery room in stable condition.  Routine postop plan for knee replacement.     SURGEON:  Surgeon(s) and Role:    *  E , MD - Primary  PHYSICIAN ASSISTANT:   ASSISTANTS: betty ashley and wayne mcfatter    ANESTHESIA:   spinal  EBL:  Total I/O In: 1400 [I.V.:1400] Out: 500 [Urine:500]  BLOOD ADMINISTERED:none  DRAINS: (1) Hemovact drain(s) in the joint with  Suction Off   LOCAL MEDICATIONS USED:  MARCAINE   , Amount: 60 ml and OTHER epi  SPECIMEN:  No Specimen  DISPOSITION OF SPECIMEN:  N/A  COUNTS:  YES  TOURNIQUET:   Total Tourniquet Time Documented: Thigh (Left) - 87 minutes Total: Thigh (Left) - 87 minutes   DICTATION: .Dragon Dictation  PLAN OF CARE: Admit to inpatient   PATIENT DISPOSITION:  PACU - hemodynamically stable.   Delay start of Pharmacological VTE agent (>24hrs) due to surgical blood loss or risk of bleeding: yes  

## 2012-04-27 NOTE — Anesthesia Procedure Notes (Signed)
Spinal  Patient location during procedure: OR Start time: 04/27/2012 7:43 AM Staffing CRNA/Resident: Joycelyn Man J Preanesthetic Checklist Completed: patient identified, site marked, surgical consent, pre-op evaluation, timeout performed, IV checked, risks and benefits discussed and monitors and equipment checked Spinal Block Patient position: left lateral decubitus Prep: Betadine Patient monitoring: heart rate, cardiac monitor, continuous pulse ox and blood pressure Approach: midline Location: L2-3 Injection technique: single-shot Needle Needle type: Spinocan  Needle gauge: 22 G Assessment Sensory level: T10 Additional Notes CSF brisk and clear    [id number]     02/2013

## 2012-04-27 NOTE — Anesthesia Preprocedure Evaluation (Signed)
Anesthesia Evaluation  Patient identified by MRN, date of birth, ID band Patient awake    Reviewed: Allergy & Precautions, H&P , NPO status , Patient's Chart, lab work & pertinent test results  Airway Mallampati: II TM Distance: >3 FB Neck ROM: Full    Dental  (+) Edentulous Upper and Missing   Pulmonary COPDCurrent Smoker,    Pulmonary exam normal       Cardiovascular hypertension, Pt. on medications + CAD and + Peripheral Vascular Disease Rhythm:Regular Rate:Normal     Neuro/Psych  Headaches, negative psych ROS   GI/Hepatic GERD-  Medicated,  Endo/Other    Renal/GU      Musculoskeletal   Abdominal Normal abdominal exam  (+)   Peds  Hematology   Anesthesia Other Findings   Reproductive/Obstetrics                           Anesthesia Physical Anesthesia Plan  ASA: III  Anesthesia Plan: Spinal   Post-op Pain Management:    Induction:   Airway Management Planned: Nasal Cannula  Additional Equipment:   Intra-op Plan:   Post-operative Plan:   Informed Consent: I have reviewed the patients History and Physical, chart, labs and discussed the procedure including the risks, benefits and alternatives for the proposed anesthesia with the patient or authorized representative who has indicated his/her understanding and acceptance.     Plan Discussed with:   Anesthesia Plan Comments:         Anesthesia Quick Evaluation

## 2012-04-27 NOTE — Transfer of Care (Signed)
Immediate Anesthesia Transfer of Care Note  Patient: Jimmy Duran [facility]  Procedure(s) Performed: Procedure(s): TOTAL KNEE ARTHROPLASTY (Left)  Patient Location: PACU  Anesthesia Type:Spinal  Level of Consciousness: awake, alert  and patient cooperative  Airway & Oxygen Therapy: Patient Spontanous Breathing and Patient connected to face mask oxygen  Post-op Assessment: Report given to PACU RN and Post -op Vital signs reviewed and stable  Post vital signs: Reviewed and stable  Complications: No apparent anesthesia complications

## 2012-04-28 ENCOUNTER — Encounter (HOSPITAL_COMMUNITY): Payer: Self-pay | Admitting: Orthopedic Surgery

## 2012-04-28 LAB — CBC
HCT: 37.2 % — ABNORMAL LOW (ref 39.0–52.0)
Hemoglobin: 13 g/dL (ref 13.0–17.0)
MCH: 32.5 pg (ref 26.0–34.0)
MCHC: 34.9 g/dL (ref 30.0–36.0)
MCV: 93 fL (ref 78.0–100.0)
Platelets: 270 10*3/uL (ref 150–400)
RBC: 4 MIL/uL — ABNORMAL LOW (ref 4.22–5.81)
RDW: 12.2 % (ref 11.5–15.5)
WBC: 11.1 10*3/uL — ABNORMAL HIGH (ref 4.0–10.5)

## 2012-04-28 LAB — BASIC METABOLIC PANEL
BUN: 7 mg/dL (ref 6–23)
CO2: 28 mEq/L (ref 19–32)
Calcium: 8.8 mg/dL (ref 8.4–10.5)
Chloride: 94 mEq/L — ABNORMAL LOW (ref 96–112)
Creatinine, Ser: 0.69 mg/dL (ref 0.50–1.35)
GFR calc Af Amer: >90 mL/min (ref >90)
GFR calc non Af Amer: >90 mL/min (ref >90)
Glucose, Bld: 220 mg/dL — ABNORMAL HIGH (ref 70–99)
Potassium: 4.1 mEq/L (ref 3.5–5.1)
Sodium: 131 mEq/L — ABNORMAL LOW (ref 135–145)

## 2012-04-28 MED ORDER — OXYCODONE-ACETAMINOPHEN 5-325 MG PO TABS
1.0000 | ORAL_TABLET | ORAL | Status: DC
  Administered 2012-04-28 – 2012-04-30 (×13): 1 via ORAL
  Filled 2012-04-28 (×14): qty 1

## 2012-04-28 NOTE — Progress Notes (Signed)
Pt's BP 168/11.  Pt in CPM with pain level of 4.  Dr. Romeo Apple paged.  Dr. Romeo Apple returned page.  Nurse asked Dr. Romeo Apple if he wanted to order something or consult the hospitalist.  Informed Dr. Romeo Apple that pt is in CPM with pain level of 4.  Stated that he was not concerned with the blood pressure and would look at in the morning.  Have night shift monitor the BP readings.  Orders followed.

## 2012-04-28 NOTE — Progress Notes (Signed)
Pt's BP 176/113.  Pt has been participating in PT and has CPM.  Dr. Romeo Apple paged.  Dr. Romeo Apple returned page and stated to continue to monitor pt's BP at this time.  No new orders at this time.  Will continue to monitor.

## 2012-04-28 NOTE — Plan of Care (Signed)
Problem: Phase II Progression Outcomes Goal: Tolerating diet Outcome: Completed/Met Date Met:  04/28/12 Carb modified diet.  Tolerated well.

## 2012-04-28 NOTE — Progress Notes (Signed)
Pt over the limit for 24 hour amount of Tylenol.  Foot pumps are not available at this time.  Dr. Romeo Apple notified.  Stated that is was ok for the patient to proceed with the Percocet.  No other orders given at this time.

## 2012-04-28 NOTE — Progress Notes (Signed)
Occupational Therapy Screen   OT orders received. Patient chart reviewed. Spoke with patient regarding occupational therapy and if he was experiencing difficulty with BADL. Patient presents with 5/5 strength in BUE. Patient does have a shower chair with back. Patient will require a 3-in-1 commode for home. Patient is having difficulty with lower body dressing and bathing which is expected. Patient will have his wife at home to provide 24 hour assistance and states that she will not mind helping him out at home. Educated patient on AE such as reach and sock aid to increase functional performance during LB dressing and where he may obtain equipment. Patient verbalized understanding. At this time, patient does feel the need for occupational therapy. Will sign off, per patient's request.  Limmie Patricia, OTR/L 04/28/12 1:30PM

## 2012-04-28 NOTE — Progress Notes (Signed)
UR Chart Review Completed  

## 2012-04-28 NOTE — Anesthesia Postprocedure Evaluation (Signed)
  Anesthesia Post-op Note  Patient: Jimmy Duran [facility]  Procedure(s) Performed: Procedure(s): TOTAL KNEE ARTHROPLASTY (Left)  Patient Location: room 318  Anesthesia Type:Spinal  Level of Consciousness: awake, alert , oriented and patient cooperative  Airway and Oxygen Therapy: Patient Spontanous Breathing  Post-op Pain: 4 /10, mild  Post-op Assessment: Post-op Vital signs reviewed, Patient's Cardiovascular Status Stable, Respiratory Function Stable, Patent Airway, No signs of Nausea or vomiting, Adequate PO intake and Pain level controlled  Post-op Vital Signs: Reviewed and stable  Complications: No apparent anesthesia complications

## 2012-04-28 NOTE — Progress Notes (Signed)
Physical Therapy Treatment Patient Details Name: Jimmy Duran MRN: [id number] DOB: 1955-11-06 Today's Date: 04/28/2012 Time: 0810-0840 PT Time Calculation (min): 30 min  PT Assessment / Plan / Recommendation Comments on Treatment Session  Pt tolerated well towards total session with no c/o increased pain with activities.  Able to complete all bed exercises correctly wtih no cueing requires.  Improved gait mechanics with RW SBA with min cueing to increase stride length and posture for improved gait mechanics.  Pt left in chair with call bell within reach and ice applied to knee.      Follow Up Recommendations        Does the patient have the potential to tolerate intense rehabilitation     Barriers to Discharge        Equipment Recommendations       Recommendations for Other Services    Frequency     Plan      Precautions / Restrictions Precautions Precautions: Knee Restrictions Weight Bearing Restrictions: No   Pertinent Vitals/Pain     Mobility  Bed Mobility Bed Mobility: Left Sidelying to Sit Left Sidelying to Sit: 4: Min assist;Other (comment) Details for Bed Mobility Assistance: min assist needed to move LLE out of bed Transfers Transfers: Sit to Stand;Stand to Sit Sit to Stand: 5: Supervision;With upper extremity assist;From bed Stand to Sit: 5: Supervision;With upper extremity assist;To chair/3-in-1 Ambulation/Gait Ambulation/Gait Assistance: 5: Supervision Ambulation Distance (Feet): 80 Feet Assistive device: Rolling walker Gait Pattern: Step-through pattern;Decreased hip/knee flexion - left;Decreased step length - left Stairs: No Wheelchair Mobility Wheelchair Mobility: No    Exercises Total Joint Exercises Ankle Circles/Pumps: AROM;Both;20 reps Quad Sets: AROM;Left;10 reps;Supine Short Arc Quad: AAROM;Left;10 reps;Supine Heel Slides: AAROM;Left;10 reps;Supine Hip ABduction/ADduction: AROM;Both;10 reps;Supine Goniometric ROM: 7-56 deg, AROM, 5-65  AAROM   PT Diagnosis:    PT Problem List:   PT Treatment Interventions:     PT Goals Acute Rehab PT Goals PT Goal: Supine/Side to Sit - Progress: Progressing toward goal PT Goal: Sit to Supine/Side - Progress: Progressing toward goal PT Goal: Sit to Stand - Progress: Progressing toward goal PT Goal: Ambulate - Progress: Progressing toward goal PT Goal: Up/Down Stairs - Progress: Not met  Visit Information  Last PT Received On: 04/28/12    Subjective Data  Subjective: Increased swelling today, just received pain meds pain scale 2/10   Cognition  Cognition Overall Cognitive Status: Appears within functional limits for tasks assessed/performed Arousal/Alertness: Awake/alert Orientation Level: Appears intact for tasks assessed Behavior During Session: [facility] for tasks performed    Balance     End of Session PT - End of Session Equipment Utilized During Treatment: Gait belt Activity Tolerance: Patient tolerated treatment well Patient left: in chair;with call bell/phone within reach   GP     [name] 04/28/2012, 12:16 PM

## 2012-04-28 NOTE — Progress Notes (Signed)
Physical Therapy Treatment Patient Details Name: Jimmy Duran MRN: [id number] DOB: 1955/12/13 Today's Date: 04/28/2012 Time: 7829-5621 PT Time Calculation (min): 44 min  PT Assessment / Plan / Recommendation Comments on Treatment Session  Pt c/o increased "tightness" in the L knee and is due for more pain med.  His knee is now visibly edemetous, but only moderately.  He is progressing well with transfers and gait, able to ambulate 100' with walker, PWB L.  He was so uncomfortable, I only had him do limited ther ex in the bed and elected to try to get increased ROM with the CPM.  He is currently at 45 deg flexion and I have asked RN to slowly increase to 60 deg.    Follow Up Recommendations        Does the patient have the potential to tolerate intense rehabilitation     Barriers to Discharge        Equipment Recommendations       Recommendations for Other Services    Frequency     Plan Discharge plan remains appropriate;Frequency remains appropriate    Precautions / Restrictions     Pertinent Vitals/Pain     Mobility  Bed Mobility Bed Mobility: Sit to Supine Left Sidelying to Sit: 4: Min assist;Other (comment) Sit to Supine: 4: Min assist;HOB flat Details for Bed Mobility Assistance: min assist needed to move LLE out of bed Transfers Transfers: Sit to Stand;Stand to Sit Sit to Stand: 5: Supervision;With upper extremity assist Stand to Sit: 5: Supervision;With upper extremity assist Ambulation/Gait Ambulation/Gait Assistance: 5: Supervision Ambulation Distance (Feet): 100 Feet Assistive device: Rolling walker Ambulation/Gait Assistance Details: pt is about 50% weight bearing L Gait Pattern: Step-through pattern;Decreased hip/knee flexion - left;Decreased step length - left General Gait Details: pt now is taking too long of a step with LLE...he was instructed to take a shorter step Stairs: No Wheelchair Mobility Wheelchair Mobility: No    Exercises Total Joint  Exercises Ankle Circles/Pumps: AROM;Both;10 reps;Supine Quad Sets: AROM;Both;10 reps;Supine Short Arc Quad: AAROM;Left;10 reps;Supine Heel Slides: AAROM;Left;10 reps;Supine Hip ABduction/ADduction: AROM;Both;10 reps;Supine Goniometric ROM: 7-56 deg, AROM, 5-65 AAROM   PT Diagnosis:    PT Problem List:   PT Treatment Interventions:     PT Goals Acute Rehab PT Goals PT Goal: Supine/Side to Sit - Progress: Progressing toward goal PT Goal: Sit to Supine/Side - Progress: Progressing toward goal Pt will go Sit to Stand: with modified independence PT Goal: Sit to Stand - Progress: Progressing toward goal PT Goal: Ambulate - Progress: Progressing toward goal PT Goal: Up/Down Stairs - Progress: Not met  Visit Information  Last PT Received On: 04/28/12    Subjective Data  Subjective: my knee feels really swolen   Cognition  Cognition Overall Cognitive Status: Appears within functional limits for tasks assessed/performed Arousal/Alertness: Awake/alert Orientation Level: Appears intact for tasks assessed Behavior During Session: [facility] for tasks performed    Balance     End of Session PT - End of Session Equipment Utilized During Treatment: Gait belt Activity Tolerance: Patient limited by pain;Patient limited by fatigue Patient left: in bed;in CPM;with call bell/phone within reach;with nursing in room Nurse Communication: Mobility status CPM Left Knee CPM Left Knee: On Left Knee Flexion (Degrees): 45 Left Knee Extension (Degrees): 0 Additional Comments: The MD order is to have CPM set at 0-60 deg, however pt could not tolerate 60 deg of knee flexion.  We have initiated CPM at 45 deg of flexion and I have asked RN to  increase  5 deg/hour to get to 60 deg.  Pt just had some more pain med and as he relaxes we should be able to get more flexion.   GP     Konrad Penta 04/28/2012, 2:59 PM

## 2012-04-28 NOTE — Plan of Care (Signed)
Problem: Phase II Progression Outcomes Goal: Ambulates Outcome: Completed/Met Date Met:  04/28/12 Pt ambulated last night with PT and this morning using front wheel walker.  Tolerates well.

## 2012-04-28 NOTE — Clinical Social Work Note (Signed)
CSW received referral for possible SNF. Pt had PT evaluation yesterday and did very well. Plan is to return home with home health. CSW signing off as no other apparent CSW needs, but can be reconsulted if needed.  Derenda Fennel, [place] [phone]

## 2012-04-28 NOTE — Progress Notes (Signed)
Patient ID: Jimmy Duran, male   DOB: 10-Oct-1955, 57 y.o.   MRN: [id number] BP 156/97  Pulse 83  Temp(Src) 97.4 F (36.3 C) (Oral)  Resp 20  Ht 5\' 7"  (1.702 m)  Wt 170 lb (77.111 kg)  BMI 26.62 kg/m2  SpO2 93%  The encounter diagnosis was OA (osteoarthritis) of knee.   Status post left total knee arthroplasty, postop day 1  Patient's pain levels over the night were 5-6 controlled with Percocet and Dilaudid with good relief down to 2 or 3. The patient was able to ambulate greater than 50 feet.  He had some drainage through the dressing which was dislodged. His drain put out a reasonable amount.  Recommend continued physical therapy reinforced dressing as needed  Continue pain medication as ordered, if pain control becomes an issue we can and OxyContin 10 mg twice a day

## 2012-04-28 NOTE — Care Management Note (Signed)
    Page 1 of 2   04/30/2012     11:41:10 AM   CARE MANAGEMENT NOTE 04/30/2012  Patient:  Jimmy Duran, Jimmy Duran   Account Number:  [id number]  Date Initiated:  04/28/2012  Documentation initiated by:  Sharrie Rothman  Subjective/Objective Assessment:   Pt admitted from home s/p total left knee. Pt lives with his wife and will return home at discharge. Pt was fairly independent with ADL's.     Action/Plan:   Pt will need HH and DME arranged prior to discharge. Will continue to follow.   Anticipated DC Date:  04/30/2012   Anticipated DC Plan:  HOME W HOME HEALTH SERVICES      DC Planning Services  CM consult      PAC Choice  DURABLE MEDICAL EQUIPMENT  HOME HEALTH   Choice offered to / List presented to:  C-1 Patient   DME arranged  3-N-1  CPM  SHOWER STOOL  WALKER - ROLLING      DME agency  [facility]     HH arranged  HH-1 RN  HH-2 PT  HH-3 OT      [facility] agency  [facility]   Status of service:  Completed, signed off Medicare Important Message given?   (If response is "NO", the following Medicare IM given date fields will be blank) Date Medicare IM given:   Date Additional Medicare IM given:    Discharge Disposition:  HOME W HOME HEALTH SERVICES  Per UR Regulation:    If discussed at Long Length of Stay Meetings, dates discussed:    Comments:  04/30/12 1138 Arlyss Queen, RN BSN CM Pt discharged home today with [facility]. Alroy Bailiff of [facility] is aware and will collect the pts information from the chart. Tula Nakayama of [facility] delivered pts rolling walker to room and will deliver other DME to pts home this afternoon. HH services will start within 48 hours. Pt and pts nurse aware of discharge arrangements.  04/29/12 1333 Arlyss Queen, RN BSN CM Pt choose [facility] for [facility] and DME. Tula Nakayama of [facility] is aware and will collect pts information from the chart.  04/28/12 1500 Arlyss Queen, RN BSN CM

## 2012-04-28 NOTE — Progress Notes (Signed)
Inpatient Diabetes Program Recommendations  AACE/ADA: New Consensus Statement on Inpatient Glycemic Control (2013)  Target Ranges:  Prepandial:   less than 140 mg/dL      Peak postprandial:   less than 180 mg/dL (1-2 hours)      Critically ill patients:  140 - 180 mg/dL   Results for [name], [name] (MRN [id number]) as of 04/28/2012 09:00  Ref. Range 04/27/2012 06:33  Glucose-Capillary Latest Range: 70-99 mg/dL 295 (H)  Results for [name], [name] (MRN [id number]) as of 04/28/2012 09:00  Ref. Range 04/28/2012 05:51  Glucose Latest Range: 70-99 mg/dL 846 (H)    Inpatient Diabetes Program Recommendations Correction (SSI): Please consider ordering CBGs ACHS with Novolog correction if needed. HgbA1C: Please consider ordering an A1C to determine glycemic control over the past 2-3 months.  Note: Patient has a history of diabetes and takes Metformin 500 mg BID at home for diabetes management.  Currently, patient is ordered to receive Metformin 500 mg BID for inpatient glycemic control.  Fasting lab glucose was 220 mg/dl this morning.  Please consider ordering CBGs ACHS with Novolog correction if needed to improve glycemic control.  Also, please consider ordering an A1C.  Will continue to follow.  Thanks, Orlando Penner, RN, BSN, CCRN Diabetes Coordinator Inpatient Diabetes Program [phone]

## 2012-04-28 NOTE — Plan of Care (Signed)
Problem: Phase I Progression Outcomes Goal: Initial discharge plan identified Outcome: Completed/Met Date Met:  04/28/12 Return home at discharge.

## 2012-04-29 LAB — CBC
HCT: 39.5 % (ref 39.0–52.0)
Hemoglobin: 14.1 g/dL (ref 13.0–17.0)
MCH: 33 pg (ref 26.0–34.0)
MCHC: 35.7 g/dL (ref 30.0–36.0)
MCV: 92.5 fL (ref 78.0–100.0)
Platelets: 284 10*3/uL (ref 150–400)
RBC: 4.27 MIL/uL (ref 4.22–5.81)
RDW: 12.4 % (ref 11.5–15.5)
WBC: 8 10*3/uL (ref 4.0–10.5)

## 2012-04-29 MED ORDER — AMLODIPINE BESYLATE 5 MG PO TABS
5.0000 mg | ORAL_TABLET | Freq: Every day | ORAL | Status: DC
  Administered 2012-04-29: 5 mg via ORAL
  Filled 2012-04-29: qty 1

## 2012-04-29 MED ORDER — ATORVASTATIN CALCIUM 20 MG PO TABS
20.0000 mg | ORAL_TABLET | Freq: Every day | ORAL | Status: DC
  Administered 2012-04-29: 20 mg via ORAL
  Filled 2012-04-29: qty 1

## 2012-04-29 NOTE — Progress Notes (Signed)
Physical Therapy Treatment Patient Details Name: Jimmy Duran MRN: [id number] DOB: 04-Jun-1955 Today's Date: 04/29/2012 Time: 0830-0930 PT Time Calculation (min): 60 min  PT Assessment / Plan / Recommendation Comments on Treatment Session  Increased distance with gait training with cueing to equalize stride length for more normallized gait pattern.  Pt and family member instructed stair training backwards with walker with no handrails to simulate return home, pt able to demonstrate safe mechanics and stated he feels confident to complete at home.  Increased AROM 8-82, PROM 5-88.  Pt left in chair with call bell within reach and family member present in room.  Ice applied to knee for pain and edema control.      Follow Up Recommendations        Does the patient have the potential to tolerate intense rehabilitation     Barriers to Discharge        Equipment Recommendations       Recommendations for Other Services    Frequency     Plan      Precautions / Restrictions Precautions Precautions: Knee Restrictions Weight Bearing Restrictions: No   Pertinent Vitals/Pain     Mobility  Bed Mobility Bed Mobility: Supine to Sit Supine to Sit: 4: Min assist;With rails Transfers Transfers: Sit to Stand;Stand to Sit Sit to Stand: 5: Supervision;With upper extremity assist Stand to Sit: 5: Supervision;With upper extremity assist Ambulation/Gait Ambulation/Gait Assistance: 5: Supervision Ambulation Distance (Feet): 215 Feet Assistive device: Rolling walker Gait Pattern: Step-through pattern;Decreased hip/knee flexion - left;Decreased step length - left Stairs: Yes Stairs Assistance: 4: Min assist Stair Management Technique: Backwards;No rails;With walker Number of Stairs: 3 Wheelchair Mobility Wheelchair Mobility: No    Exercises Total Joint Exercises Ankle Circles/Pumps: AROM;Both;10 reps;Supine Quad Sets: AROM;Both;10 reps;Supine Short Arc Quad: AAROM;Left;10  reps;Supine Heel Slides: AAROM;Left;10 reps;Supine Goniometric ROM: AAROM 8-82 PROM 5-88   PT Diagnosis:    PT Problem List:   PT Treatment Interventions:     PT Goals Acute Rehab PT Goals PT Goal: Supine/Side to Sit - Progress: Met PT Goal: Sit to Supine/Side - Progress: Met PT Goal: Sit to Stand - Progress: Met PT Goal: Ambulate - Progress: Progressing toward goal PT Goal: Up/Down Stairs - Progress: Progressing toward goal  Visit Information  Last PT Received On: 04/29/12    Subjective Data  Subjective: Increased pain today, feels like MD got my knee to 90 degrees this morning   Cognition  Cognition Overall Cognitive Status: Appears within functional limits for tasks assessed/performed Arousal/Alertness: Awake/alert Orientation Level: Appears intact for tasks assessed Behavior During Session: [facility] for tasks performed    Balance     End of Session PT - End of Session Equipment Utilized During Treatment: Gait belt Activity Tolerance: Patient tolerated treatment well Patient left: in chair;with call bell/phone within reach;with family/visitor present Nurse Communication: Mobility status CPM Left Knee CPM Left Knee: Off   GP     [name] 04/29/2012, 9:30 AM

## 2012-04-29 NOTE — Progress Notes (Addendum)
Subjective: 2 Days Post-Op Procedure(s) (LRB): TOTAL KNEE ARTHROPLASTY (Left) Patient reports pain as c/o tightness.    Objective: Vital signs in last 24 hours: Temp:  [97.2 F (36.2 C)-99 F (37.2 C)] 98.3 F (36.8 C) (02/26 0549) Pulse Rate:  [85-102] 89 (02/26 0549) Resp:  [16-22] 20 (02/26 0549) BP: (153-176)/(95-113) 166/97 mmHg (02/26 0549) SpO2:  [92 %-96 %] 94 % (02/26 0549)  Intake/Output from previous day: 02/25 0701 - 02/26 0700 In: 2000 [P.O.:600; I.V.:1400] Out: 2640 [Urine:2600; Drains:40] Intake/Output this shift:     Recent Labs  04/28/12 0551 04/29/12 0509  HGB 13.0 14.1    Recent Labs  04/28/12 0551 04/29/12 0509  WBC 11.1* 8.0  RBC 4.00* 4.27  HCT 37.2* 39.5  PLT 270 284    Recent Labs  04/28/12 0551  NA 131*  K 4.1  CL 94*  CO2 28  BUN 7  CREATININE 0.69  GLUCOSE 220*  CALCIUM 8.8   No results found for this basename: LABPT, INR,  in the last 72 hours  Neurologically intact ABD soft Neurovascular intact Sensation intact distally Intact pulses distally Dorsiflexion/Plantar flexion intact Incision: incision line is dry; drain tube site drainage  Compartment soft      Assessment/Plan: 2 Days Post-Op Procedure(s) (LRB): TOTAL KNEE ARTHROPLASTY (Left) Advance diet Up with therapy D/C IV fluids i was able to obtain 80 degrees of knee flexion with him today   i would like a long not short ted hose on his left leg   [name] 04/29/2012, 7:46 AM

## 2012-04-29 NOTE — Progress Notes (Signed)
Physical Therapy Treatment Patient Details Name: Jimmy Duran MRN: [id number] DOB: 08-28-1955 Today's Date: 04/29/2012 Time: 0865-7846 PT Time Calculation (min): 18 min  PT Assessment / Plan / Recommendation Comments on Treatment Session  Pt continues to improve overall independence with gait and requires max cueing for knee flexion during gait activities.  Pt has improved distal quadricep coordniation allowing for signficant improvment in knee extension. full extension may be limited due to bed causing mild knee support even when fully lowered.     Follow Up Recommendations        Does the patient have the potential to tolerate intense rehabilitation     Barriers to Discharge        Equipment Recommendations       Recommendations for Other Services    Frequency     Plan Discharge plan remains appropriate    Precautions / Restrictions     Pertinent Vitals/Pain 3/10 to L knee    Mobility  Bed Mobility Bed Mobility: Sit to Supine Sit to Supine: 4: Min assist;HOB flat Transfers Transfers: Sit to Stand;Stand to Sit Sit to Stand: 6: Modified independent (Device/Increase time);With armrests;From chair/3-in-1 Stand to Sit: 6: Modified independent (Device/Increase time);With upper extremity assist;To bed Ambulation/Gait Ambulation/Gait Assistance: 4: Min assist Ambulation Distance (Feet): 300 Feet Assistive device: 1 person hand held assist Gait Pattern: Step-through pattern;Decreased hip/knee flexion - left;Decreased step length - left    Exercises Total Joint Exercises Quad Sets: AROM;Left;15 reps;Other (comment);Supine (5 sec holds) Heel Slides: AAROM;Left;10 reps;Seated;Other (comment) (5 sec hold w/R LE) Straight Leg Raises: AAROM;Left;10 reps;Supine Goniometric ROM: AAROM: 5-86   PT Diagnosis:    PT Problem List:   PT Treatment Interventions:     PT Goals Acute Rehab PT Goals PT Goal Formulation: With patient Time For Goal Achievement: 05/04/12 Potential to  Achieve Goals: Good Pt will go Supine/Side to Sit: with modified independence PT Goal: Supine/Side to Sit - Progress: Met Pt will go Sit to Supine/Side: with modified independence PT Goal: Sit to Supine/Side - Progress: Met Pt will go Sit to Stand: with modified independence PT Goal: Sit to Stand - Progress: Met Pt will Ambulate: 51 - 150 feet;with supervision;with rolling walker PT Goal: Ambulate - Progress: Met Pt will Go Up / Down Stairs: 1-2 stairs;with min assist;with rolling walker PT Goal: Up/Down Stairs - Progress: Met Pt will Perform Home Exercise Program: with supervision, verbal cues required/provided PT Goal: Perform Home Exercise Program - Progress: Progressing toward goal  Visit Information  Last PT Received On: 04/29/12    Subjective Data      Cognition       Balance     End of Session PT - End of Session Equipment Utilized During Treatment: Gait belt CPM Left Knee Left Knee Flexion (Degrees): 86 Left Knee Extension (Degrees): 5     , PT 04/29/2012, 5:59 PM

## 2012-04-29 NOTE — Progress Notes (Signed)
Inpatient Diabetes Program Recommendations  AACE/ADA: New Consensus Statement on Inpatient Glycemic Control (2013)  Target Ranges:  Prepandial:   less than 140 mg/dL      Peak postprandial:   less than 180 mg/dL (1-2 hours)      Critically ill patients:  140 - 180 mg/dL   Results for TERREL, NESHEIWAT (MRN [id number]) as of 04/28/2012 09:00   Ref. Range  04/27/2012 06:33   Glucose-Capillary  Latest Range: 70-99 mg/dL  295 (H)   Results for CHOYA, TORNOW (MRN [id number]) as of 04/28/2012 09:00   Ref. Range  04/28/2012 05:51   Glucose  Latest Range: 70-99 mg/dL  102 (H)     Inpatient Diabetes Program Recommendations  Correction (SSI): Please consider ordering CBGs ACHS with Novolog correction if needed.  HgbA1C: Please consider ordering an A1C to determine glycemic control over the past 2-3 months.    Note: Patient has a history of diabetes and takes Metformin 500 mg BID at home for diabetes management. Currently, patient is ordered to receive Metformin 500 mg BID for inpatient glycemic control. Fasting lab glucose was 220 mg/dl on 09/26/34. Please consider ordering CBGs ACHS with Novolog correction if needed to improve glycemic control. Also, please consider ordering an A1C. Will continue to follow.   Thanks,  Orlando Penner, RN, BSN, CCRN  Diabetes Coordinator  Inpatient Diabetes Program  [phone]

## 2012-04-30 LAB — CBC
HCT: 38.7 % — ABNORMAL LOW (ref 39.0–52.0)
Hemoglobin: 13.6 g/dL (ref 13.0–17.0)
MCH: 32.9 pg (ref 26.0–34.0)
MCHC: 35.1 g/dL (ref 30.0–36.0)
MCV: 93.5 fL (ref 78.0–100.0)
Platelets: 317 10*3/uL (ref 150–400)
RBC: 4.14 MIL/uL — ABNORMAL LOW (ref 4.22–5.81)
RDW: 12.5 % (ref 11.5–15.5)
WBC: 10 10*3/uL (ref 4.0–10.5)

## 2012-04-30 MED ORDER — SENNOSIDES-DOCUSATE SODIUM 8.6-50 MG PO TABS: 1.0000 | ORAL_TABLET | Freq: Every day | ORAL | Status: DC

## 2012-04-30 MED ORDER — OXYCODONE-ACETAMINOPHEN 5-325 MG PO TABS: 1.0000 | ORAL_TABLET | ORAL | Status: DC

## 2012-04-30 MED ORDER — METHOCARBAMOL 500 MG PO TABS: 500.0000 mg | ORAL_TABLET | Freq: Four times a day (QID) | ORAL | Status: DC

## 2012-04-30 MED ORDER — ASPIRIN 325 MG PO TBEC: 325.0000 mg | DELAYED_RELEASE_TABLET | Freq: Every day | ORAL | Status: DC

## 2012-04-30 NOTE — Progress Notes (Signed)
Pt. D/c instructions reviewed with pt. And wife, dressing changing instructions and wound care also reviewed with teach back method. Questions about care were answered, medications were reviewed, and prescriptions were given also. At this time pt. Is stable to be d/c.

## 2012-04-30 NOTE — Discharge Summary (Signed)
Physician Discharge Summary  Patient ID: Jimmy Duran MRN: [id number] DOB/AGE: 10-02-1955 57 y.o.  Admit date: 04/27/2012 Discharge date: 04/30/2012  Admission Diagnoses: oa left knee   Discharge Diagnoses: same  Active Problems:   * No active hospital problems. *   Discharged Condition: good  Hospital Course: The patient was admitted on 424 frontal a left total knee arthroplasty he did well he ambulated greater than 100 feet he flexes knee 85. His pain was controlled with Percocet. He is neurovascularly intact at discharge.  Consults: .hg was 13  Significant Diagnostic Studies: none  Treatments: surgery: left tka depuy sigma FB PS   Discharge Exam: Blood pressure 96/70, pulse 100, temperature 98 F (36.7 C), temperature source Oral, resp. rate 17, height 5\' 7"  (1.702 m), weight 170 lb (77.111 kg), SpO2 93.00%. General appearance: alert, cooperative and appears stated age Incision/Wound:clean   Disposition: 01-Home or Self Care  Discharge Orders   Future Appointments Provider Department Dept Phone   05/11/2012 1:30 PM Jimmy Hearing, MD [facility] [phone]   Future Orders Complete By Expires     Face-to-face encounter  As directed     Comments:      I Jimmy Duran certify that this patient is under my care and that I, or a nurse practitioner or physician's assistant working with me, had a face-to-face encounter that meets the physician face-to-face encounter requirements with this patient on 04/30/2012. The encounter with the patient was in whole, or in part for the following medical condition(s) which is the primary reason for home health care (List medical condition): left knee oa , s/p left tka    Questions:      The encounter with the patient was in whole, or in part, for the following medical condition, which is the primary reason for home health care:  left total knee    I certify that, based on my findings, the following  services are medically necessary home health services:  Nursing    Physical therapy    My clinical findings support the need for the above services:  Unsafe ambulation due to balance issues    Further, I certify that my clinical findings support that this patient is homebound due to:  Unable to leave home safely without assistance    To provide the following care/treatments:  RN    PT    OT    Home Health  As directed     Questions:      To provide the following care/treatments:  RN    PT    OT        Medication List    STOP taking these medications       amoxicillin 500 MG capsule  Commonly known as:  AMOXIL     HYDROcodone-acetaminophen 5-325 MG per tablet  Commonly known as:  NORCO     naproxen 250 MG tablet  Commonly known as:  NAPROSYN      TAKE these medications       aspirin 325 MG EC tablet  Take 1 tablet (325 mg total) by mouth daily.     diphenhydrAMINE 25 MG tablet  Commonly known as:  BENADRYL  Take 25 mg by mouth every morning.     fish oil-omega-3 fatty acids 1000 MG capsule  Take 2 g by mouth 2 (two) times daily.     lisinopril-hydrochlorothiazide 20-12.5 MG per tablet  Commonly known as:  PRINZIDE,ZESTORETIC  Take 1 tablet by mouth 2 (  two) times daily.     metFORMIN 500 MG tablet  Commonly known as:  GLUCOPHAGE  Take 500 mg by mouth 2 (two) times daily with a meal.     methocarbamol 500 MG tablet  Commonly known as:  ROBAXIN  Take 1 tablet (500 mg total) by mouth 4 (four) times daily.     oxyCODONE-acetaminophen 5-325 MG per tablet  Commonly known as:  PERCOCET/ROXICET  Take 1 tablet by mouth every 4 (four) hours.     ranitidine 150 MG capsule  Commonly known as:  ZANTAC  Take 300 mg by mouth every morning.     senna-docusate 8.6-50 MG per tablet  Commonly known as:  SENOKOT S  Take 1 tablet by mouth daily.     simvastatin 40 MG tablet  Commonly known as:  ZOCOR  Take 1 tablet (40 mg total) by mouth every morning.            Follow-up Information   Follow up with [facility].   Contact information:   [address] [place] [address] [phone]      Follow up with Jimmy Canada, MD On 05/11/2012.   Contact information:   [address], Jimmy Duran [place] [address] [phone]       Signed: [name] 04/30/2012, 7:25 AM

## 2012-05-11 ENCOUNTER — Ambulatory Visit (INDEPENDENT_AMBULATORY_CARE_PROVIDER_SITE_OTHER): Payer: BC Managed Care – PPO | Admitting: Orthopedic Surgery

## 2012-05-11 ENCOUNTER — Encounter: Payer: Self-pay | Admitting: Orthopedic Surgery

## 2012-05-11 VITALS — BP 130/74 | Ht 67.0 in | Wt 170.0 lb

## 2012-05-11 DIAGNOSIS — Z96659 Presence of unspecified artificial knee joint: Secondary | ICD-10-CM

## 2012-05-11 DIAGNOSIS — Z96652 Presence of left artificial knee joint: Secondary | ICD-10-CM

## 2012-05-11 MED ORDER — OXYCODONE-ACETAMINOPHEN 5-325 MG PO TABS: 1.0000 | ORAL_TABLET | ORAL | Status: DC

## 2012-05-11 NOTE — Progress Notes (Signed)
Patient ID: Jimmy Duran, male   DOB: 24-Nov-1955, 57 y.o.   MRN: [id number] Chief Complaint  Patient presents with  . Follow-up    Post op #1, left knee replacement. DOS 04-27-12.    BP 130/74  Ht 5\' 7"  (1.702 m)  Wt 170 lb (77.111 kg)  BMI 26.62 kg/m2  2 weeks postop left total knee replacement doing well. Knee flexion is about 90 knee extension is about 5 short of full. Looks good staples are taken out patient is in better with a cane  He will finish home therapy this week and can start outpatient physical therapy  We will refill his Percocet and see him again in 4 weeks per protocol

## 2012-05-11 NOTE — Patient Instructions (Signed)
Call hospital to start physical therapy after your home physical therapy is complete  We will update your FMLA papers

## 2012-05-19 ENCOUNTER — Ambulatory Visit (HOSPITAL_COMMUNITY)
Admission: RE | Admit: 2012-05-19 | Discharge: 2012-05-19 | Disposition: A | Discharge status: Home / Self Care | Payer: BC Managed Care – PPO | Source: Ambulatory Visit | Attending: Orthopedic Surgery | Admitting: Orthopedic Surgery

## 2012-05-19 DIAGNOSIS — IMO0001 Reserved for inherently not codable concepts without codable children: Secondary | ICD-10-CM | POA: Insufficient documentation

## 2012-05-19 DIAGNOSIS — M25669 Stiffness of unspecified knee, not elsewhere classified: Secondary | ICD-10-CM | POA: Insufficient documentation

## 2012-05-19 DIAGNOSIS — R262 Difficulty in walking, not elsewhere classified: Secondary | ICD-10-CM | POA: Insufficient documentation

## 2012-05-19 DIAGNOSIS — J449 Chronic obstructive pulmonary disease, unspecified: Secondary | ICD-10-CM | POA: Insufficient documentation

## 2012-05-19 DIAGNOSIS — I10 Essential (primary) hypertension: Secondary | ICD-10-CM | POA: Insufficient documentation

## 2012-05-19 DIAGNOSIS — E119 Type 2 diabetes mellitus without complications: Secondary | ICD-10-CM | POA: Insufficient documentation

## 2012-05-19 DIAGNOSIS — J4489 Other specified chronic obstructive pulmonary disease: Secondary | ICD-10-CM | POA: Insufficient documentation

## 2012-05-19 DIAGNOSIS — M6281 Muscle weakness (generalized): Secondary | ICD-10-CM | POA: Insufficient documentation

## 2012-05-19 DIAGNOSIS — M25569 Pain in unspecified knee: Secondary | ICD-10-CM | POA: Insufficient documentation

## 2012-05-19 NOTE — Evaluation (Signed)
Physical Therapy Evaluation  Patient Details  Name: Jimmy Duran MRN: [id number] Date of Birth: 10/20/1955  Today's Date: 05/19/2012 Time: 0820-0905 PT Time Calculation (min): 45 min Charges: 1 eval, 10' TE, 1 ice             Visit#: 1 of 8  Re-eval: 06/18/12 Assessment Diagnosis: L TKR Surgical Date: 04/30/11 Next MD Visit: Dr. Romeo Apple - 06/22/12  Past Medical History:  Past Medical History  Diagnosis Date  . Coronary artery disease     Nonobstructive  . Type 2 diabetes mellitus   . Mixed hyperlipidemia   . Essential hypertension, benign   . Carotid artery disease   . COPD (chronic obstructive pulmonary disease)   . GERD (gastroesophageal reflux disease)   . Hemorrhoids   . Diabetes mellitus   . Stroke     "ministrokes"  . Arthritis    Past Surgical History:  Past Surgical History  Procedure Laterality Date  . Appendectomy  1970  . Cholecystectomy  1993  . Umbilical hernia repair  1993  . Breast surgery      Cyst resection on the right  . Knee surgery      Left knee arthroscopy torn medial meniscus grade 4 chondral changes medial femoral condyle tibial plateau  . Total knee arthroplasty Left 04/27/2012    Procedure: TOTAL KNEE ARTHROPLASTY;  Surgeon: Vickki Hearing, MD;  Location: [facility];  Service: Orthopedics;  Laterality: Left;    Subjective Symptoms/Limitations Pertinent History: Pt is referred to PT for L TKR.  C/o is pain and stiffness to his lt knee.  He states that is ambulating independently in his house and using is cane sometimes for outdoor surfaces.  he reports that he was able to go to 107 degrees of knee flexion and is still lacking knee extension. He is exercising his knee 5-6x a day.  How long can you stand comfortably?: 20-30 minutes How long can you walk comfortably?: walking around [employer] with a grocery cart for 30 minutes.  Patient Stated Goals: Pt wishes to go back to work  Pain Assessment Currently in Pain?: Yes Pain Score:    5 Pain Location: Knee Pain Orientation: Left Pain Type: Surgical pain Pain Relieving Factors: ice and pain medication,  Precautions/Restrictions  Precautions Precautions: Knee  Balance Screening Balance Screen Has the patient fallen in the past 6 months: No Has the patient had a decrease in activity level because of a fear of falling? : No Is the patient reluctant to leave their home because of a fear of falling? 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  Requires squatting, kneeling,lunging, walking and crouching.    Sensation/Coordination/Flexibility/Functional Tests Functional Tests Functional Tests: 5 sit to stand: 30 sec. Functional Tests: Lower Extremity Functioal Scale (LEFS): 31/80   LLE AROM (degrees) LLE Overall AROM Comments: taken in supine position Left Knee Extension: 4 Left Knee Flexion: 103  LLE PROM (degrees) Left Knee Extension: 1 Left Knee Flexion: 108  LLE Strength Left Hip Flexion: 4/5 Left Hip Extension: 5/5 Left Hip ABduction: 4/5 Left Hip ADduction: 5/5 Left Knee Flexion: 3+/5 Left Knee Extension: 4/5  Palpation Palpation: decreased patellar mobility, pain and tenderness to popliteal and medial  knee.   Mobility/Balance  Ambulation/Gait Ambulation/Gait Assistance: 6: Modified independent (Device/Increase time) Assistive device: Straight cane (occiasionally) Gait Pattern: Decreased hip/knee flexion - left;Left flexed knee in stance;Trunk rotated posteriorly on left Static Standing Balance Single Leg Stance - Right Leg: 10 Single Leg Stance - Left Leg: 10 Tandem Stance - Right Leg: 10 Tandem Stance - Left Leg:  10 Rhomberg - Eyes Opened: 10 Rhomberg - Eyes Closed: 10   Exercise/Treatments Stretches Active Hamstring Stretch: 1 rep;30 seconds Quad Stretch: 1 rep;30 seconds Aerobic Stationary Bike: 6 minutes seat 10 for ROM Supine Patellar Mobs: Education and demonstration 4 directions  Sidelying Hip ABduction: AROM;10 reps;Limitations Hip ABduction Limitations: 3 sec holds Prone  Hamstring Curl: 5 reps  Modalities Modalities: Cryotherapy Cryotherapy Number Minutes Cryotherapy: 10 Minutes Cryotherapy Location: Knee Type of Cryotherapy: Ice pack  Physical Therapy Assessment and Plan PT Assessment and Plan Clinical Impression Statement: Pt is a 57 year old male referred to PT s/p L TKR with following impairments listed below.  He is an active male who plans to return to work and therefore recommened 2x/week for 8 weeks to advance to return to work activities. Currently lt knee AROM: 4-103 degrees.  Pt will benefit from skilled therapeutic intervention in order to improve on the following deficits: Difficulty walking;Decreased strength;Pain;Impaired perceived functional ability;Decreased mobility;Increased edema Rehab Potential: Good PT Frequency: Min 2X/week PT Duration: 8 weeks PT Treatment/Interventions: Gait training;Functional mobility training;Stair training;Therapeutic activities;Therapeutic exercise;Patient/family education;Manual techniques;Modalities PT Plan: Bike for ROM, squats, standing knee flexion, heel and toe raises, heel walking, standing TKE, rocker board.  Progress to cybex strengthening, lunges, side stepping with theraband to continue to improve functional strength.      Goals Home Exercise Program Pt will Perform Home Exercise Program: Independently PT Goal: Perform Home Exercise Program - Progress: Goal set today PT Short Term Goals Time to Complete Short Term Goals: 4 weeks PT Short Term Goal 1: Pt will report pain less than 4/10 for 50% of his day for improved  QOL.  PT Short Term Goal 2: Pt will improve his LLE functional strength and tolerate standing for 45 minutes in order to progress towards return to work activities.  PT Short Term Goal 3: Pt will improve lt knee AROM 0-110 degrees for greater ease for ascending and descending stairs.  PT Long Term Goals Time to Complete Long Term Goals: 8 weeks PT Long Term Goal 1: Pt will improve LLE functional strength to WNL and demonstrate 5 half kneel to stands independently in order to perform return to work activities.  PT Long Term Goal 2: Pt will improve his LLE AROM to WNL in order to ascend and descend 10 stairs w/1 handrail with reciprocal pattern for greater ease with entering community dwellings.  Long Term Goal 3: Pt will report pain less than 3/10 for 75% of his day for improved QOL.  Long Term Goal 4: Pt will improve his gait mechanics  to WNL in order to tolerate ambulating independently for 1 hour in order to return to work to decrease risk of secondary impairments.  PT Long Term Goal 5: Pt will improve his LEFS to 60/80 for improved percived functional mobility.   Problem List Patient Active Problem List  Diagnosis  . MIXED HYPERLIPIDEMIA  . Tobacco use disorder  . ESSENTIAL HYPERTENSION, BENIGN  . CORONARY ATHEROSCLEROSIS NATIVE CORONARY ARTERY  . CAROTID ARTERY DISEASE  . CHEST PAIN UNSPECIFIED  . GERD (gastroesophageal reflux disease)  . Diabetes mellitus  . COPD (chronic obstructive  pulmonary disease)  . Hemorrhoids  . Headache  . Medial meniscus, posterior horn derangement  . OA (osteoarthritis) of knee  . H/O arthroscopy of left knee  . Patellar tendinitis of left knee    PT Plan of Care PT Home Exercise Plan: see scanned report PT Patient Instructions: importance to continue with HEP. Patellar mobs.  Consulted and Agree with Plan of Care: Patient  Annett Fabian, PT 05/19/2012, 9:24 AM  Physician Documentation Your signature is required to indicate approval of the  treatment plan as stated above.  Please sign and either send electronically or make a copy of this report for your files and return this physician signed original.   Please mark one 1.__approve of plan  2. ___approve of plan with the following conditions.   ______________________________                                                          _____________________ Physician Signature                                                                                                             Date

## 2012-05-21 ENCOUNTER — Ambulatory Visit (HOSPITAL_COMMUNITY)
Admission: RE | Admit: 2012-05-21 | Discharge: 2012-05-21 | Disposition: A | Discharge status: Home / Self Care | Payer: BC Managed Care – PPO | Source: Ambulatory Visit | Attending: Orthopedic Surgery | Admitting: Orthopedic Surgery

## 2012-05-21 NOTE — Progress Notes (Addendum)
Physical Therapy Treatment Patient Details  Name: Jimmy Duran MRN: [id number] Date of Birth: November 23, 1955  Today's Date: 05/21/2012 Time: 1022-1100 PT Time Calculation (min): 38 min Charges: 28' TE, 10 manual  Visit#: 2 of 8  Re-eval: 06/18/12  Subjective: Symptoms/Limitations Symptoms: Pt states that he is getting better everyday.  He is going to call to have CPM picked up.  biggest complaint is pain at night.  Pain Assessment Currently in Pain?: Yes Pain Score:   3 Pain Location: Knee Pain Type: Acute pain  Precautions/Restrictions     Exercise/Treatments Stretches Gastroc Stretch: 3 reps;30 seconds Aerobic Elliptical: NuStep: resistance 2, 8 minutes seat 7 Standing Lateral Step Up: Left;10 reps;Hand Hold: 2;Step Height: 4" Forward Step Up: Left;10 reps;Hand Hold: 2;Step Height: 4" Step Down: Left;10 reps;Step Height: 4";Hand Hold: 1 Wall Squat: 10 reps Rocker Board: 2 minutes (side to side) SLS: 3x30 sec holds w/intermittent HHA Gait Training: 2 round trips with cueing for pelvic stabiliation Other Standing Knee Exercises: lateral walking with red theraband 3 count x7, 2 count x7 reps, 1 count x7 reps Other Standing Knee Exercises: heel and toe walking 2 round trips Sidelying Hip ABduction: 10 reps  Manual Therapy Manual Therapy: Massage Massage: soft tissue massage to hamstring and iliotibial band in prone to decrease pain x10 minutes  Physical Therapy Assessment and Plan PT Assessment and Plan Clinical Statement: Continue to progress overall function and ROM.  Added stair training and SLS activities to improve strength and proprioceptive awareness.  Pt has decreased pain after manual therapy.  PT Plan: Continue progressing strength, Functional ROM,  And decrease pain.    Goals    Problem List Patient Active Problem List  Diagnosis  . MIXED HYPERLIPIDEMIA  . Tobacco use disorder  . ESSENTIAL HYPERTENSION, BENIGN  . CORONARY ATHEROSCLEROSIS NATIVE  CORONARY ARTERY  . CAROTID ARTERY DISEASE  . CHEST PAIN UNSPECIFIED  . GERD (gastroesophageal reflux disease)  . Diabetes mellitus  . COPD (chronic obstructive pulmonary disease)  . Hemorrhoids  . Headache  . Medial meniscus, posterior horn derangement  . OA (osteoarthritis) of knee  . H/O arthroscopy of left knee  . Patellar tendinitis of left knee    , PT 05/21/2012, 11:06 AM

## 2012-05-26 ENCOUNTER — Ambulatory Visit (HOSPITAL_COMMUNITY)
Admission: RE | Admit: 2012-05-26 | Discharge: 2012-05-26 | Disposition: A | Discharge status: Home / Self Care | Payer: BC Managed Care – PPO | Source: Ambulatory Visit | Attending: Orthopedic Surgery | Admitting: Orthopedic Surgery

## 2012-05-26 NOTE — Progress Notes (Signed)
Physical Therapy Treatment Patient Details  Name: Jimmy Duran MRN: [id number] Date of Birth: 08/31/1955  Today's Date: 05/26/2012 Time: 4098-1191 PT Time Calculation (min): 48 min Charge : therex 38', massage 10'  Visit#: 3 of 8  Re-eval: 06/18/12   Subjective: Symptoms/Limitations Symptoms: Pt stated he is getting better everyday.  Reported most difficulty wtih sitting for long periods of time.   Pain Assessment Currently in Pain?: Yes Pain Score:   2 Pain Location: Knee Pain Orientation: Left  Objective:   Exercise/Treatments Aerobic Elliptical: NuStep: resistance 2, 8 minutes seat 6 goal for SPM >80 pt kept >100 Standing Forward Lunges: 5 reps Lateral Step Up: Left;10 reps;Hand Hold: 1;Step Height: 4" Forward Step Up: Left;10 reps;Hand Hold: 0;Step Height: 4" Step Down: Left;10 reps;Step Height: 4";Hand Hold: 0 Wall Squat: 10 reps Rocker Board: 2 minutes SLS: R 27", L 50" SLS with Vectors: 3x 5" with intermittent HHA Gait Training: 2 round trips with cueing for pelvic stabiliation Other Standing Knee Exercises: heel and toe walking 2 round trips Supine Patellar Mobs: Education and demonstration 4 directions   Manual Therapy Manual Therapy: Massage Massage: soft tissue massage to hamstring and iliotibial band in prone to decrease pain x 10 minutes  Physical Therapy Assessment and Plan PT Assessment and Plan Clinical Impression Statement: Pt and wife educated on proper technique with patella mobs all directions to improve confidence to complete at home.  Added forward lunges for functional strentthening and to improve ROM, min cueing for form.  Massage complete to hamstrings and iliotibial band to reduce spasms and pain.  Pt stated pain free at end of session. PT Plan: Continue progressing strength, Functional ROM, begin cybex quad and hamstring machines and resume side stepping with tband next session.    Goals    Problem List Patient Active Problem List   Diagnosis  . MIXED HYPERLIPIDEMIA  . Tobacco use disorder  . ESSENTIAL HYPERTENSION, BENIGN  . CORONARY ATHEROSCLEROSIS NATIVE CORONARY ARTERY  . CAROTID ARTERY DISEASE  . CHEST PAIN UNSPECIFIED  . GERD (gastroesophageal reflux disease)  . Diabetes mellitus  . COPD (chronic obstructive pulmonary disease)  . Hemorrhoids  . Headache  . Medial meniscus, posterior horn derangement  . OA (osteoarthritis) of knee  . H/O arthroscopy of left knee  . Patellar tendinitis of left knee    PT - End of Session Activity Tolerance: Patient tolerated treatment well General Behavior During Session: [facility] for tasks performed Cognition: [facility] for tasks performed  GP    [name] 05/26/2012, 10:40 AM

## 2012-05-28 ENCOUNTER — Ambulatory Visit (HOSPITAL_COMMUNITY)
Admission: RE | Admit: 2012-05-28 | Discharge: 2012-05-28 | Disposition: A | Discharge status: Home / Self Care | Payer: BC Managed Care – PPO | Source: Ambulatory Visit | Attending: Orthopedic Surgery | Admitting: Orthopedic Surgery

## 2012-05-28 NOTE — Progress Notes (Signed)
Physical Therapy Treatment Patient Details  Name: Jimmy Duran MRN: [id number] Date of Birth: Jan 12, 1956  Today's Date: 05/28/2012 Time: 5621-3086 PT Time Calculation (min): 53 min Charges: 35' TE, 8' Manual, 1 ice Visit#: 4 of 8  Re-eval: 06/18/12  Subjective: Symptoms/Limitations Symptoms: He is starting to walk on Monday, Wedensday and Friday for at least 30 minutes.  Went yesterday and this morning is feeling a little stiff and sore.  Pain Assessment Currently in Pain?: Yes Pain Score:   3 Pain Location: Knee Pain Orientation: Left Pain Type: Acute pain  Precautions/Restrictions     Exercise/Treatments Stretches Gastroc Stretch: 3 reps;30 seconds (slant borad) Aerobic Stationary Bike: 7 minutes level 3 to decrease stiffness Standing Forward Lunges: Left;10 reps Terminal Knee Extension: Left;10 reps;Theraband Theraband Level (Terminal Knee Extension): Level 4 (Blue) Lateral Step Up: Left;10 reps;Hand Hold: 1;Step Height: 4" Forward Step Up: Left;15 reps;Hand Hold: 0;Step Height: 6" Step Down: Left;10 reps;Step Height: 4";Hand Hold: 1 Functional Squat: 10 reps;Limitations Functional Squat Limitations: with orange ball lift and up on toes.  SLS with Vectors: 3x 5" with intermittent HHA Other Standing Knee Exercises: lateral walking with red theraband 3 count x7, 2 count x7 reps, 1 count x7 reps Other Standing Knee Exercises: heel and toe walking 2 round trips Prone  Prone Knee Hang: Limitations Prone Knee Hang Limitations: 8 minutes w/manual therapy to decrease pain Other Prone Exercises: terminal knee extension 7 reps 5 sec holds   Cryotherapy Number Minutes Cryotherapy: 10 Minutes Cryotherapy Location: Knee  Physical Therapy Assessment and Plan PT Assessment and Plan Clinical Impression Statement: Pt able to achieve lt knee AROM: 6-110 degrees.  Added activities to improve functional knee extension and flexion.  Pt continues to demonstrate compliance with  HEP and has decreased swelling to knee.  PT Plan: Continue progressing strength, Functional ROM, begin cybex quad and hamstring machines and resume side stepping with tband next session.    Goals    Problem List Patient Active Problem List  Diagnosis  . MIXED HYPERLIPIDEMIA  . Tobacco use disorder  . ESSENTIAL HYPERTENSION, BENIGN  . CORONARY ATHEROSCLEROSIS NATIVE CORONARY ARTERY  . CAROTID ARTERY DISEASE  . CHEST PAIN UNSPECIFIED  . GERD (gastroesophageal reflux disease)  . Diabetes mellitus  . COPD (chronic obstructive pulmonary disease)  . Hemorrhoids  . Headache  . Medial meniscus, posterior horn derangement  . OA (osteoarthritis) of knee  . H/O arthroscopy of left knee  . Patellar tendinitis of left knee    PT - End of Session Activity Tolerance: Patient tolerated treatment well General Behavior During Session: [facility] for tasks performed Cognition: [facility] for tasks performed   , PT 05/28/2012, 9:34 AM

## 2012-06-01 ENCOUNTER — Ambulatory Visit: Payer: BC Managed Care – PPO | Admitting: Cardiology

## 2012-06-02 ENCOUNTER — Ambulatory Visit (HOSPITAL_COMMUNITY)
Admission: RE | Admit: 2012-06-02 | Discharge: 2012-06-02 | Disposition: A | Discharge status: Home / Self Care | Payer: BC Managed Care – PPO | Source: Ambulatory Visit | Attending: Orthopedic Surgery | Admitting: Orthopedic Surgery

## 2012-06-02 DIAGNOSIS — M25669 Stiffness of unspecified knee, not elsewhere classified: Secondary | ICD-10-CM | POA: Insufficient documentation

## 2012-06-02 DIAGNOSIS — IMO0001 Reserved for inherently not codable concepts without codable children: Secondary | ICD-10-CM | POA: Insufficient documentation

## 2012-06-02 DIAGNOSIS — E119 Type 2 diabetes mellitus without complications: Secondary | ICD-10-CM | POA: Insufficient documentation

## 2012-06-02 DIAGNOSIS — J4489 Other specified chronic obstructive pulmonary disease: Secondary | ICD-10-CM | POA: Insufficient documentation

## 2012-06-02 DIAGNOSIS — R262 Difficulty in walking, not elsewhere classified: Secondary | ICD-10-CM | POA: Insufficient documentation

## 2012-06-02 DIAGNOSIS — I10 Essential (primary) hypertension: Secondary | ICD-10-CM | POA: Insufficient documentation

## 2012-06-02 DIAGNOSIS — J449 Chronic obstructive pulmonary disease, unspecified: Secondary | ICD-10-CM | POA: Insufficient documentation

## 2012-06-02 DIAGNOSIS — M25569 Pain in unspecified knee: Secondary | ICD-10-CM | POA: Insufficient documentation

## 2012-06-02 DIAGNOSIS — M6281 Muscle weakness (generalized): Secondary | ICD-10-CM | POA: Insufficient documentation

## 2012-06-02 NOTE — Progress Notes (Signed)
Physical Therapy Treatment Patient Details  Name: Jimmy Duran MRN: [id number] Date of Birth: [birth year]  Today's Date: 06/02/2012 Time: 6578-4696 PT Time Calculation (min): 55 min Visit#: 6 of 8  Re-eval: 06/18/12 Charges:  therex 40', manual 10'  Subjective: Symptoms/Limitations Symptoms: Pt. states he's been walking more and continues to exercise /massage knee and use hot/cold as needed.  Pt. reports most tightness and discomfort anterior knee (superior medial and lateral) Pain Assessment Currently in Pain?: Yes Pain Score:   2 Pain Location: Knee Pain Orientation: Left  Exercise/Treatments Stretches Gastroc Stretch: 3 reps;30 seconds Aerobic Stationary Bike: 8 minutes level 3 to decrease stiffness Standing Forward Lunges: Left;10 reps Terminal Knee Extension: Left;10 reps;Theraband Theraband Level (Terminal Knee Extension): Level 4 (Blue) Lateral Step Up: Left;10 reps;Hand Hold: 1;Step Height: 4" Forward Step Up: Left;15 reps;Hand Hold: 0;Step Height: 6" Step Down: Left;10 reps;Step Height: 4";Hand Hold: 1 Functional Squat: 10 reps;Limitations Functional Squat Limitations: with orange ball lift and up on toes.  SLS with Vectors: 3x 5" with intermittent HHA Other Standing Knee Exercises: lateral walking with red theraband 3 count x7, 2 count x7 reps, 1 count x7 reps Other Standing Knee Exercises: heel and toe walking 2 round trips Prone  Prone Knee Hang: Limitations Prone Knee Hang Limitations: 4 minutes w/manual therapy to decrease pain   Manual Therapy Manual Therapy: Other (comment) Other Manual Therapy: DTM and MFR to anterior, medial and lateral Lt knee to decrease adhesions.  Posterior knee without restrictions; changed focus today  Physical Therapy Assessment and Plan PT Assessment and Plan Clinical Impression Statement: Pt. with improved extension today, however presents with tightness with flexion.  Focused manual on anterior knee to decrease  adhesions/improve ROM.  Pt educated with scar massage for proximal portion of scar.  Pt. able to verbalize understanding.  Pt able to complete all other exercises and activities without pain.  Pt declined use of ice at clinic and reported would ice at home. PT Plan: Continue progressing strength, Functional ROM, begin cybex quad and hamstring machines and resume side stepping with tband next session.     Problem List Patient Active Problem List  Diagnosis  . MIXED HYPERLIPIDEMIA  . Tobacco use disorder  . ESSENTIAL HYPERTENSION, BENIGN  . CORONARY ATHEROSCLEROSIS NATIVE CORONARY ARTERY  . CAROTID ARTERY DISEASE  . CHEST PAIN UNSPECIFIED  . GERD (gastroesophageal reflux disease)  . Diabetes mellitus  . COPD (chronic obstructive pulmonary disease)  . Hemorrhoids  . Headache  . Medial meniscus, posterior horn derangement  . OA (osteoarthritis) of knee  . H/O arthroscopy of left knee  . Patellar tendinitis of left knee    PT - End of Session Activity Tolerance: Patient tolerated treatment well General Behavior During Session: [facility] for tasks performed Cognition: [facility] for tasks performed PT Plan of Care PT Patient Instructions: importance to continue with HEP. Patellar mobs and scar massage    Lurena Nida, PTA/CLT 06/02/2012, 12:16 PM

## 2012-06-04 ENCOUNTER — Ambulatory Visit (HOSPITAL_COMMUNITY)
Admission: RE | Admit: 2012-06-04 | Discharge: 2012-06-04 | Disposition: A | Discharge status: Home / Self Care | Payer: BC Managed Care – PPO | Source: Ambulatory Visit | Attending: Orthopedic Surgery | Admitting: Orthopedic Surgery

## 2012-06-04 NOTE — Progress Notes (Signed)
Physical Therapy Treatment Patient Details  Name: Jimmy Duran MRN: [id number] Date of Birth: 1956-03-03  Today's Date: 06/04/2012 Time: 9562-1308 PT Time Calculation (min): 41 min Charges: 31' TE, 10' Manual Visit#: 7 of 8  Re-eval: 06/18/12    Subjective: Symptoms/Limitations Symptoms: I am still doing a lot of walking.  Sometimes I have more stiffness then others and I am still not sleeping well at night.  I have been up since 4am.  Pain Assessment Currently in Pain?: Yes Pain Score:   2 Pain Location: Knee Pain Orientation: Left  Precautions/Restrictions     Exercise/Treatments Stretches Gastroc Stretch: 3 reps;30 seconds Aerobic Elliptical: NuStep: [place] #3, resistance 3, 12 minutes seat 6 goal for SPM >80 pt kept >100 Machines for Strengthening Cybex Knee Extension: 1.5 PL LLE eccentric lowering x10 reps Cybex Knee Flexion: 3 PL x10 reps Standing Lateral Step Up: Left;Hand Hold: 0;10 reps;Step Height: 4" Forward Step Up: Left;15 reps;Hand Hold: 0;Step Height: 6" Step Down: Left;10 reps;Step Height: 4";Hand Hold: 0 Prone  Prone Knee Hang Limitations: 10 minutes w/MFR   Manual Therapy Massage: soft tissue massage to hamstring and iliotibial band in prone to decrease pain x 10 minutes w/prone hange  Physical Therapy Assessment and Plan PT Assessment and Plan Clinical Impression Statement: Added cybex machines to continue to improve quadricep strength.  Lt knee AROM: 5-115 degrees at end of session. Ambulates with improved gait mechanics at beginning of session without cueing.  PT Plan: Add lunges, and functional squat activities to progress towards work activities.     Goals    Problem List Patient Active Problem List  Diagnosis  . MIXED HYPERLIPIDEMIA  . Tobacco use disorder  . ESSENTIAL HYPERTENSION, BENIGN  . CORONARY ATHEROSCLEROSIS NATIVE CORONARY ARTERY  . CAROTID ARTERY DISEASE  . CHEST PAIN UNSPECIFIED  . GERD (gastroesophageal reflux disease)   . Diabetes mellitus  . COPD (chronic obstructive pulmonary disease)  . Hemorrhoids  . Headache  . Medial meniscus, posterior horn derangement  . OA (osteoarthritis) of knee  . H/O arthroscopy of left knee  . Patellar tendinitis of left knee    PT - End of Session Activity Tolerance: Patient tolerated treatment well General Behavior During Session: [facility] for tasks performed Cognition: [facility] for tasks performed  GP      06/04/2012, 9:29 AM

## 2012-06-09 ENCOUNTER — Ambulatory Visit (HOSPITAL_COMMUNITY)
Admission: RE | Admit: 2012-06-09 | Discharge: 2012-06-09 | Disposition: A | Discharge status: Home / Self Care | Payer: BC Managed Care – PPO | Source: Ambulatory Visit | Attending: Orthopedic Surgery | Admitting: Orthopedic Surgery

## 2012-06-09 NOTE — Progress Notes (Signed)
Physical Therapy Re-evaluation/treatment  Patient Details  Name: Jimmy Duran MRN: [id number] Date of Birth: [birth year]  Today's Date: 06/09/2012 Time: 6213-0865 PT Time Calculation (min): 50 min Charge: MMT x 1, ROM Measurement x 1, manual x 8'', therex 30'              Visit#: 8 of 16  Re-eval: 07/07/12 Assessment Diagnosis: L TKR Surgical Date: 04/30/11 Next MD Visit: Dr. Romeo Apple - 06/25/12  Subjective Symptoms/Limitations Symptoms: Pt reported increased LBP this morning.  Pt stated Saturday he woke up with Lt LE hanging off with increased ankle and radicular pain.  Pt reported he feels 80-85% improvement overall. How long can you sit comfortably?: Able to sit comfortably for 1 hour How long can you stand comfortably?: Able to stand 1 hour How long can you walk comfortably?: Able to walking 1 hour Pain Assessment Currently in Pain?: Yes Pain Score:   2 Pain Location: Knee Pain Orientation: Left  Precautions/Restrictions  Precautions Precautions: Knee  Objective:   Sensation/Coordination/Flexibility/Functional Tests Functional Tests Functional Tests: 5 sit to stand: 7 sec. (5 sit to stand: 30 sec.) Functional Tests: Lower Extremity Functioal Scale (LEFS): 60/80 (Lower Extremity Functioal Scale (LEFS): 31/80)  LLE AROM (degrees) Left Knee Extension: 2 Left Knee Flexion: 115 LLE PROM (degrees) Left Knee Extension: 0 Left Knee Flexion: 118 LLE Strength Left Hip Flexion:  (4+/5 was 4/5) Left Hip ABduction: 5/5 (was 4/5) Left Knee Flexion: 4/5 (was 3+/5) Left Knee Extension:  (4+/5 was 4/5)  Exercise/Treatments Stretches ITB Stretch: 3 reps;30 seconds Gastroc Stretch: 3 reps;30 seconds Machines for Strengthening Cybex Knee Extension: 2 PL LLE eccentric lowering x10 reps Cybex Knee Flexion: 3 PL x10 reps Standing Stairs: 1RT reciprocal no HR Supine Heel Slides: 15 reps Knee Extension: PROM Knee Flexion: PROM Prone  Prone Knee Hang: Limitations Prone  Knee Hang Limitations: 8 minutes w/STM   Manual Therapy Manual Therapy: Other (comment) Massage: soft tissue massage to hamstring and iliotibial band in prone to decrease pain x 8 minutes w/ prone hang.  Physical Therapy Assessment and Plan PT Assessment and Plan Clinical Impression Statement: Reassessment complete with the following findings:  Jimmy Duran has had 8 OPPT sessions over 4 weeks.  Pt is independent with HEP daily and able to demonstrate appropriate technique with all exercises.  Pt reported pain average 4-5//10 50% of his day.  Strength improving with gait mechanics, improved ability to tolerate standing and walking for an hour and AROM improving with ability to ascend and descend stairs reciprocal pattern.  Improved perceived LE functtional scale to 60/80 (was 31/80 4 weeks ago). PT Plan: Recommend continuing OPPT for 4 more weeks to improve functinal strengthening for RTW.  Next session add lunges and functional squat activities, progress to kneeling activities.    Goals Home Exercise Program Pt will Perform Home Exercise Program: Independently PT Goal: Perform Home Exercise Program - Progress: Met (2-3x a day) PT Short Term Goals Time to Complete Short Term Goals: 4 weeks PT Short Term Goal 1: Pt will report pain less than 4/10 for 50% of his day for improved QOL.  Progressing toward goal (4-5/10 for 50% of day) PT Short Term Goal 2: Pt will improve his LLE functional strength and tolerate standing for 45 minutes in order to progress towards return to work activities. Progressing toward goal PT Short Term Goal 3: Pt will improve lt knee AROM 0-110 degrees for greater ease for ascending and descending stairs. : Progressing toward goal (AROM 2-116) PT Long  Term Goals Time to Complete Long Term Goals: 8 weeks PT Long Term Goal 1: Pt will improve LLE functional strength to WNL and demonstrate 5 half kneel to stands independently in order to perform return to work activities.   Progressing toward goal PT Long Term Goal 2: Pt will improve his LLE AROM to WNL in order to ascend and descend 10 stairs w/1 handrail with reciprocal pattern for greater ease with entering community dwellings.  Progressing toward goal Long Term Goal 3: Pt will report pain less than 3/10 for 75% of his day for improved QOL. : Not met (pain range 4-5/10) Long Term Goal 4: Pt will improve his gait mechanics  to WNL in order to tolerate ambulating independently for 1 hour in order to return to work to decrease risk of secondary impairments. Progressing toward goal PT Long Term Goal 5: Pt will improve his LEFS to 60/80 for improved percived functional mobility. Met  Problem List Patient Active Problem List  Diagnosis  . MIXED HYPERLIPIDEMIA  . Tobacco use disorder  . ESSENTIAL HYPERTENSION, BENIGN  . CORONARY ATHEROSCLEROSIS NATIVE CORONARY ARTERY  . CAROTID ARTERY DISEASE  . CHEST PAIN UNSPECIFIED  . GERD (gastroesophageal reflux disease)  . Diabetes mellitus  . COPD (chronic obstructive pulmonary disease)  . Hemorrhoids  . Headache  . Medial meniscus, posterior horn derangement  . OA (osteoarthritis) of knee  . H/O arthroscopy of left knee  . Patellar tendinitis of left knee    PT - End of Session Activity Tolerance: Patient tolerated treatment well General Behavior During Session: [facility] for tasks performed Cognition: [facility] for tasks performed  [name] 06/09/2012, 10:07 AM

## 2012-06-11 ENCOUNTER — Ambulatory Visit (HOSPITAL_COMMUNITY)
Admission: RE | Admit: 2012-06-11 | Discharge: 2012-06-11 | Disposition: A | Discharge status: Home / Self Care | Payer: BC Managed Care – PPO | Source: Ambulatory Visit | Attending: Orthopedic Surgery | Admitting: Orthopedic Surgery

## 2012-06-11 NOTE — Progress Notes (Signed)
Physical Therapy Treatment Patient Details  Name: Jimmy Duran MRN: [id number] Date of Birth: 21-Apr-1955  Today's Date: 06/11/2012 Time: 4098-1191 PT Time Calculation (min): 47 min Charge: therex 45'  Visit#: 9 of 16  Re-eval: 07/07/12 Assessment Diagnosis: L TKR Surgical Date: 04/30/11 Next MD Visit: Dr. Romeo Apple - 06/25/12  Subjective: Symptoms/Limitations Symptoms: Pt reported LBP continues, Lt knee pain 2/10  Pt c/o knee popping alot yesterday, not painful popping now. Pain Assessment Currently in Pain?: Yes Pain Score:   2 Pain Location: Knee Pain Orientation: Left  Precautions/Restrictions  Precautions Precautions: Knee  Exercise/Treatments Stretches Knee: Self-Stretch to increase Flexion: 2 reps;30 seconds Gastroc Stretch: 3 reps;30 seconds Machines for Strengthening Cybex Knee Extension: 1.5 Pl Lt LE eccentric lowering x 15 reps Cybex Knee Flexion: 3.5 PL x10 reps Standing Forward Lunges: Left;10 reps Lateral Step Up: Left;Hand Hold: 0;10 reps;Step Height: 6" Forward Step Up: Left;15 reps;Hand Hold: 0;Step Height: 6" Step Down: Left;10 reps;Step Height: 4";Hand Hold: 0 Functional Squat: 5 reps;Limitations Functional Squat Limitations: proer lifting 8# box lifting to top of blue cabinet to simulate work Walking with Sports Cord: 2RT small Careers information officer: sled push 5 RT Supine Terminal Knee Extension: 15 reps  Physical Therapy Assessment and Plan PT Assessment and Plan Clinical Impression Statement: Progressed therex for functinal strengthening for RTW.  Pt able to complete exercises with min cueing required, limited by fatigue at end of session. PT Plan: Continue with current OPC progressing to RTW activites.  Begin lunge walking next session and progress to kneeling activities.    Goals    Problem List Patient Active Problem List  Diagnosis  . MIXED HYPERLIPIDEMIA  . Tobacco use disorder  . ESSENTIAL HYPERTENSION, BENIGN  . CORONARY  ATHEROSCLEROSIS NATIVE CORONARY ARTERY  . CAROTID ARTERY DISEASE  . CHEST PAIN UNSPECIFIED  . GERD (gastroesophageal reflux disease)  . Diabetes mellitus  . COPD (chronic obstructive pulmonary disease)  . Hemorrhoids  . Headache  . Medial meniscus, posterior horn derangement  . OA (osteoarthritis) of knee  . H/O arthroscopy of left knee  . Patellar tendinitis of left knee    PT - End of Session Activity Tolerance: Patient tolerated treatment well General Behavior During Session: [facility] for tasks performed Cognition: [facility] for tasks performed  GP    [name] 06/11/2012, 8:54 PM

## 2012-06-17 ENCOUNTER — Ambulatory Visit (HOSPITAL_COMMUNITY): Payer: BC Managed Care – PPO

## 2012-06-18 ENCOUNTER — Ambulatory Visit (HOSPITAL_COMMUNITY): Payer: BC Managed Care – PPO

## 2012-06-22 ENCOUNTER — Other Ambulatory Visit: Payer: Self-pay | Admitting: Cardiology

## 2012-06-22 ENCOUNTER — Ambulatory Visit (HOSPITAL_COMMUNITY)
Admission: RE | Admit: 2012-06-22 | Discharge: 2012-06-22 | Disposition: A | Discharge status: Home / Self Care | Payer: BC Managed Care – PPO | Source: Ambulatory Visit | Attending: Orthopedic Surgery | Admitting: Orthopedic Surgery

## 2012-06-22 ENCOUNTER — Ambulatory Visit: Payer: BC Managed Care – PPO | Admitting: Orthopedic Surgery

## 2012-06-22 NOTE — Progress Notes (Signed)
Physical Therapy Re-Evaluation  Patient Details  Name: Jimmy Duran MRN: [id number] Date of Birth: [birth year]  Today's Date: 06/22/2012 Time: 0930-1004 PT Time Calculation (min): 34 min Charges: 25' TE, 9' Self Care             Visit#: 9 of 16  Re-eval: 07/07/12 Assessment Diagnosis: L TKR Surgical Date: 04/30/11 Next MD Visit: Dr. Romeo Apple - 06/25/12  Subjective Symptoms/Limitations Symptoms: Pt reports that he could not come last week because he was sick with a virus.  He states that his stiffness has not been as bad and the swelling is coming and going.  he is having most difficulty sleeping at night time. Some days I feel like I can go back to work and others day I'm not sure.  Pain Assessment Currently in Pain?: Yes Pain Score:   4 Pain Location: Knee  Assessment LLE AROM (degrees) Left Knee Extension: 1 (was 4) Left Knee Flexion: 120 (was 103) LLE Strength Left Hip Flexion:  (4+/5) Left Hip Extension: 5/5 Left Hip ABduction:  (4+/5) Left Hip ADduction: 5/5 Left Knee Flexion:  (4+/5) Left Knee Extension: 5/5 (4+/5) Palpation Palpation: pain and tenderness with stiffness to medial, lateral and anterior knee.   Exercise/Treatments Aerobic Elliptical: NuStep: Hills #3, resistance 5, 10 minutes seat 6 goal for SPM >80 pt kept >100 Machines for Strengthening Cybex Knee Extension: 1PL LLE only Cybex Knee Flexion: 2 PL LLE 10 reps Standing Forward Lunges: 10 reps;Limitations Forward Lunges Limitations: 5lb hand weight Side Lunges: Both;5 reps;Other (comment) (5lbs) Walking with Sports Cord: 1RT large cord Other Standing Knee Exercises: Hamstring Circuit w/green ball: bridges x10, partial roll ups x5  Physical Therapy Assessment and Plan PT Assessment and Plan Clinical Impression Statement: Re-eval complete for MD apt on 4/24, decreased weight today secondary to increased stiffness and pain at the beginning of the session. Tolerated exercises well.   Jimmy Duran has  attended 9 OP PT visits with following findings: knee AROM: 1-120 degrees, strength overall 4+/5, continues to struggle with stiffness and pain at the end of the day and has difficulty sleeping. has increased swelling after standing most of the day.  At this time feel pt would continue to benefit from skilled OP PT in order to progress strengthening for safe return to work activities.  Rehab Potential: Good PT Plan: Continue with strengthening activities for return to work.     Goals Home Exercise Program Pt will Perform Home Exercise Program: Independently PT Goal: Perform Home Exercise Program - Progress: Met PT Short Term Goals Time to Complete Short Term Goals: 4 weeks PT Short Term Goal 1: Pt will report pain less than 4/10 for 50% of his day for improved QOL.  (6/10 at night time - difficulty sleeping) PT Short Term Goal 1 - Progress: Progressing toward goal PT Short Term Goal 2: Pt will improve his LLE functional strength and tolerate standing for 45 minutes in order to progress towards return to work activities.  (45 minutes and has increased pain) PT Short Term Goal 2 - Progress: Met PT Short Term Goal 3: Pt will improve lt knee AROM 0-110 degrees for greater ease for ascending and descending stairs.  (AROM: 1-120, pain with ascending stairs) PT Short Term Goal 3 - Progress: Partly met PT Long Term Goals Time to Complete Long Term Goals: 8 weeks PT Long Term Goal 1: Pt will improve LLE functional strength to WNL and demonstrate 5 half kneel to stands independently in order to perform return to  work activities.  PT Long Term Goal 1 - Progress: Met PT Long Term Goal 2: Pt will improve his LLE AROM to WNL in order to ascend and descend 10 stairs w/1 handrail with reciprocal pattern for greater ease with entering community dwellings.  PT Long Term Goal 2 - Progress: Progressing toward goal Long Term Goal 3: Pt will report pain less than 3/10 for 75% of his day for improved QOL.  Long Term  Goal 3 Progress: Progressing toward goal Long Term Goal 4: Pt will improve his gait mechanics  to WNL in order to tolerate ambulating independently for 1 hour in order to return to work to decrease risk of secondary impairments.  Long Term Goal 4 Progress: Progressing toward goal PT Long Term Goal 5: Pt will improve his LEFS to 60/80 for improved percived functional mobility.  Long Term Goal 5 Progress: Progressing toward goal  Problem List Patient Active Problem List  Diagnosis  . MIXED HYPERLIPIDEMIA  . Tobacco use disorder  . ESSENTIAL HYPERTENSION, BENIGN  . CORONARY ATHEROSCLEROSIS NATIVE CORONARY ARTERY  . CAROTID ARTERY DISEASE  . CHEST PAIN UNSPECIFIED  . GERD (gastroesophageal reflux disease)  . Diabetes mellitus  . COPD (chronic obstructive pulmonary disease)  . Hemorrhoids  . Headache  . Medial meniscus, posterior horn derangement  . OA (osteoarthritis) of knee  . H/O arthroscopy of left knee  . Patellar tendinitis of left knee    PT - End of Session Activity Tolerance: Patient tolerated treatment well General Cognition: WFL for tasks performed PT Plan of Care PT Patient Instructions: importance to continue with HEP. Patellar mobs and scar massage  Consulted and Agree with Plan of Care: Patient  Annett Fabian, PT 06/22/2012, 10:23 AM  Physician Documentation Your signature is required to indicate approval of the treatment plan as stated above.  Please sign and either send electronically or make a copy of this report for your files and return this physician signed original.   Please mark one 1.__approve of plan  2. ___approve of plan with the following conditions.   ______________________________                                                          _____________________ Physician Signature                                                                                                             Date

## 2012-06-23 LAB — HEPATIC FUNCTION PANEL
ALT: 18 U/L (ref 0–53)
AST: 13 U/L (ref 0–37)
Albumin: 3.9 g/dL (ref 3.5–5.2)
Alkaline Phosphatase: 107 U/L (ref 39–117)
Bilirubin, Direct: <0.1 mg/dL (ref 0.0–0.3)
Total Bilirubin: 0.2 mg/dL — ABNORMAL LOW (ref 0.3–1.2)
Total Protein: 6.6 g/dL (ref 6.0–8.3)

## 2012-06-24 ENCOUNTER — Encounter: Payer: Self-pay | Admitting: Cardiology

## 2012-06-24 ENCOUNTER — Ambulatory Visit (INDEPENDENT_AMBULATORY_CARE_PROVIDER_SITE_OTHER): Payer: BC Managed Care – PPO | Admitting: Cardiology

## 2012-06-24 VITALS — BP 124/70 | HR 101 | Ht 67.0 in | Wt 159.1 lb

## 2012-06-24 DIAGNOSIS — I6529 Occlusion and stenosis of unspecified carotid artery: Secondary | ICD-10-CM

## 2012-06-24 DIAGNOSIS — I2510 Atherosclerotic heart disease of native coronary artery without angina pectoris: Secondary | ICD-10-CM

## 2012-06-24 DIAGNOSIS — E782 Mixed hyperlipidemia: Secondary | ICD-10-CM

## 2012-06-24 DIAGNOSIS — I10 Essential (primary) hypertension: Secondary | ICD-10-CM

## 2012-06-24 DIAGNOSIS — F17200 Nicotine dependence, unspecified, uncomplicated: Secondary | ICD-10-CM

## 2012-06-24 NOTE — Patient Instructions (Signed)
Continue all current medications. Your physician wants you to follow up in: 6 months.  You will receive a reminder letter in the mail one-two months in advance.  If you don't receive a letter, please call our office to schedule the follow up appointment   

## 2012-06-24 NOTE — Assessment & Plan Note (Signed)
Unable to quit smoking over time as yet.

## 2012-06-24 NOTE — Assessment & Plan Note (Signed)
No active angina symptoms with history of nonobstructive disease. Continue medical therapy and observation.

## 2012-06-24 NOTE — Assessment & Plan Note (Signed)
Have encouraged him to find a primary care provider to assist with management.

## 2012-06-24 NOTE — Progress Notes (Signed)
Clinical Summary Jimmy Duran is a 57 y.o.male last seen in September 2013.  Record review finds hospitalization in February of this year status post left TKA. He is currently undergoing rehabilitation.  Lab work from September of last year showed cholesterol 186, triglycerides 366, HDL 40, LDL 73, AST 16, ALT 19. Carotid Dopplers in October 2013 demonstrated 50-69% LICA stenosis and less than 50% RICA stenosis. He had followup lab work recently, results pending.  ECG today shows sinus tachycardia with NSST changes. Jimmy Duran reports no chest pain or unusual shortness of breath.  He states he is looking for a local primary care provider to help with diabetes management.  No Known Allergies  Current Outpatient Prescriptions  Medication Sig Dispense Refill  . aspirin EC 325 MG EC tablet Take 1 tablet (325 mg total) by mouth daily.      . diphenhydrAMINE (BENADRYL) 25 MG tablet Take 25 mg by mouth every morning.      . fish oil-omega-3 fatty acids 1000 MG capsule Take 2 g by mouth 2 (two) times daily.       Marland Kitchen lisinopril-hydrochlorothiazide (PRINZIDE,ZESTORETIC) 20-12.5 MG per tablet Take 1 tablet by mouth 2 (two) times daily.  60 tablet  11  . methocarbamol (ROBAXIN) 500 MG tablet Take 1 tablet (500 mg total) by mouth 4 (four) times daily.  56 tablet  2  . oxyCODONE-acetaminophen (PERCOCET/ROXICET) 5-325 MG per tablet Take 1 tablet by mouth every 4 (four) hours as needed.      . ranitidine (ZANTAC) 150 MG capsule Take 300 mg by mouth every morning.       . simvastatin (ZOCOR) 40 MG tablet Take 40 mg by mouth every evening.       No current facility-administered medications for this visit.    Past Medical History  Diagnosis Date  . Coronary artery disease     Nonobstructive  . Type 2 diabetes mellitus   . Mixed hyperlipidemia   . Essential hypertension, benign   . Carotid artery disease   . COPD (chronic obstructive pulmonary disease)   . GERD (gastroesophageal reflux disease)   .  Hemorrhoids   . Diabetes mellitus   . Stroke     "ministrokes"  . Arthritis     Social History Jimmy Duran reports that he has been smoking Cigarettes.  He has a 15 pack-year smoking history. His smokeless tobacco use includes Chew. Jimmy Duran reports that  drinks alcohol.  Review of Systems Improving range of motion in his left knee. No palpitations or syncope.  Physical Examination Filed Vitals:   06/24/12 0916  BP: 124/70  Pulse: 101   Filed Weights   06/24/12 0916  Weight: 159 lb 1.9 oz (72.176 kg)    HEENT: Conjunctiva and lids normal, oropharynx with poor dentition.  Neck: Supple, no elevated JVP or significant carotid bruits, no thyromegaly.  Lungs: Diminished breath sounds throughout, no wheezing, nonlabored.  Cardiac: Regular rate and rhythm, no S3 or significant systolic murmur.  Abdomen: Soft, nontender, bowel sounds present.  Skin: Warm and dry.  Musculoskeletal: No kyphosis.  Extremities: No pitting edema, distal pulses 1-2+. Well-healed incision anterior left knee  Neuropsychiatric: Alert and oriented x3, affect appropriate.    Problem List and Plan   CORONARY ATHEROSCLEROSIS NATIVE CORONARY ARTERY No active angina symptoms with history of nonobstructive disease. Continue medical therapy and observation.  ESSENTIAL HYPERTENSION, BENIGN Blood pressure is well controlled today.  Tobacco use disorder Unable to quit smoking over time as yet.  MIXED HYPERLIPIDEMIA  Continue statin. Followup FLP and LFT pending.  Diabetes mellitus Have encouraged him to find a primary care provider to assist with management.  CAROTID ARTERY DISEASE Mild to moderate disease, most significant on the left. Can arrange followup carotid Dopplers around the time of his next visit in 6 months.    Jonelle Sidle, M.D., F.A.C.C.

## 2012-06-24 NOTE — Assessment & Plan Note (Signed)
Blood pressure is well-controlled today. 

## 2012-06-24 NOTE — Assessment & Plan Note (Signed)
Continue statin. Followup FLP and LFT pending.

## 2012-06-24 NOTE — Assessment & Plan Note (Signed)
Mild to moderate disease, most significant on the left. Can arrange followup carotid Dopplers around the time of his next visit in 6 months.

## 2012-06-25 ENCOUNTER — Ambulatory Visit (HOSPITAL_COMMUNITY)
Admission: RE | Admit: 2012-06-25 | Discharge: 2012-06-25 | Disposition: A | Discharge status: Home / Self Care | Payer: BC Managed Care – PPO | Source: Ambulatory Visit | Attending: Orthopedic Surgery | Admitting: Orthopedic Surgery

## 2012-06-25 ENCOUNTER — Encounter: Payer: Self-pay | Admitting: Orthopedic Surgery

## 2012-06-25 ENCOUNTER — Ambulatory Visit (INDEPENDENT_AMBULATORY_CARE_PROVIDER_SITE_OTHER): Payer: BC Managed Care – PPO | Admitting: Orthopedic Surgery

## 2012-06-25 VITALS — BP 126/76 | Ht 67.0 in | Wt 170.0 lb

## 2012-06-25 DIAGNOSIS — Z96659 Presence of unspecified artificial knee joint: Secondary | ICD-10-CM

## 2012-06-25 DIAGNOSIS — M5410 Radiculopathy, site unspecified: Secondary | ICD-10-CM

## 2012-06-25 DIAGNOSIS — IMO0002 Reserved for concepts with insufficient information to code with codable children: Secondary | ICD-10-CM

## 2012-06-25 DIAGNOSIS — Z96652 Presence of left artificial knee joint: Secondary | ICD-10-CM

## 2012-06-25 MED ORDER — GABAPENTIN 100 MG PO CAPS: 100.0000 mg | ORAL_CAPSULE | Freq: Three times a day (TID) | ORAL | Status: DC

## 2012-06-25 MED ORDER — PREDNISONE 10 MG PO KIT: PACK | ORAL | Status: DC

## 2012-06-25 MED ORDER — HYDROCODONE-ACETAMINOPHEN 10-325 MG PO TABS: 1.0000 | ORAL_TABLET | Freq: Four times a day (QID) | ORAL | Status: DC | PRN

## 2012-06-25 NOTE — Progress Notes (Signed)
Patient ID: Jimmy Duran, male   DOB: [birth year], 57 y.o.   MRN: [id number] Chief Complaint  Patient presents with  . Follow-up    6 week recheck on left TKA. DOS 04-27-12.      [name] is complaining of pain in his knee at night placed in the recumbent position although he will ambulate for an hour at a time with little to no discomfort. He is having night pain and pain running up the back of his leg. He says he can't sit on his left side he has to put a pillow under his left buttock area when he is driving  Driving exacerbates the problem as well. Recently he had some back pain and stiffness prior to one of his therapy sessions  He is distinct about the pain running proximally behind the knee.  Exam his ambulation is much improved from prior surgery although he still has a limp he has no swelling in his knee his range of motion is measured 0-120 with stability intact motor exam normal scans normal good pulse distally  He has palpable tenderness along the sciatic nerve and the posterior part of the thigh and knee radiating up towards the buttocks  Straight leg raise is uncomfortable hamstrings are tight  Recommend steroid Dosepak and started gabapentin increased all the way up to 300 mg if needed  Followup in a month continue therapy Radicular syndrome of left leg - Plan: gabapentin (NEURONTIN) 100 MG capsule, PredniSONE 10 MG KIT  S/P total knee replacement, left - Plan: Ambulatory referral to Physical Therapy, HYDROcodone-acetaminophen (NORCO) 10-325 MG per tablet

## 2012-06-25 NOTE — Progress Notes (Signed)
Physical Therapy Treatment Patient Details  Name: Jimmy Duran MRN: [id number] Date of Birth: [birth year]  Today's Date: 06/25/2012 Time: 4098-1191 PT Time Calculation (min): 33 min Charge: therex 30'  Visit#: 10 of 16  Re-eval: 07/07/12    Subjective: Symptoms/Limitations Symptoms: Pt reported he had to help couch t-ball practice last night, increased pain from standing for long periods. Pain Assessment Currently in Pain?: Yes Pain Score:   4 Pain Location: Knee Pain Orientation: Left  Objective:   Exercise/Treatments Stretches Gastroc Stretch: 3 reps;30 seconds;Limitations Gastroc Stretch Limitations: slant board Aerobic Elliptical: NuStep: Hills #3, resistance 5, 10 minutes seat 6 goal for SPM >80 pt kept >100 Machines for Strengthening Cybex Knee Extension: 1PL LLE only 2x 15 Cybex Knee Flexion: 2.5 PL LLE 2x 10 reps Standing Functional Squat: 10 reps Functional Squat Limitations: proer lifting 8# box lifting to top of blue cabinet to simulate work [employer] - [employer]: 1RT with 5# bicep curls Walking with Sports Cord: 2RT large cord forward and backwards Other Standing Knee Exercises: Hamstring Circuit w/green ball: bridges x10, partial roll ups x5 Other Standing Knee Exercises: kneeling 5x      Physical Therapy Assessment and Plan PT Assessment and Plan Clinical Impression Statement: Continued therex for LE functional strengthening, min cueing required for form to reduce stress on knees.  Pt limited by fatigue, no c/o pain through session. PT Plan: Continue with strengthening activities for return to work.     Goals    Problem List Patient Active Problem List  Diagnosis  . MIXED HYPERLIPIDEMIA  . Tobacco use disorder  . ESSENTIAL HYPERTENSION, BENIGN  . CORONARY ATHEROSCLEROSIS NATIVE CORONARY ARTERY  . CAROTID ARTERY DISEASE  . GERD (gastroesophageal reflux disease)  . Diabetes mellitus  . COPD (chronic obstructive pulmonary disease)  .  Hemorrhoids  . Headache  . Medial meniscus, posterior horn derangement  . OA (osteoarthritis) of knee  . H/O arthroscopy of left knee  . Patellar tendinitis of left knee    PT - End of Session Activity Tolerance: Patient tolerated treatment well General Behavior During Therapy: WFL for tasks assessed/performed Cognition: WFL for tasks performed  GP    [name] 06/25/2012, 9:30 AM

## 2012-06-25 NOTE — Patient Instructions (Signed)
No new instructions other than the medication

## 2012-06-29 ENCOUNTER — Telehealth (HOSPITAL_COMMUNITY): Payer: Self-pay

## 2012-06-29 ENCOUNTER — Ambulatory Visit (HOSPITAL_COMMUNITY): Payer: BC Managed Care – PPO | Admitting: Physical Therapy

## 2012-06-30 ENCOUNTER — Encounter: Payer: Self-pay | Admitting: Internal Medicine

## 2012-07-02 ENCOUNTER — Ambulatory Visit (INDEPENDENT_AMBULATORY_CARE_PROVIDER_SITE_OTHER): Payer: BC Managed Care – PPO | Admitting: Gastroenterology

## 2012-07-02 ENCOUNTER — Ambulatory Visit (HOSPITAL_COMMUNITY)
Admission: RE | Admit: 2012-07-02 | Discharge: 2012-07-02 | Disposition: A | Discharge status: Home / Self Care | Payer: BC Managed Care – PPO | Source: Ambulatory Visit | Attending: Orthopedic Surgery | Admitting: Orthopedic Surgery

## 2012-07-02 ENCOUNTER — Encounter: Payer: Self-pay | Admitting: Gastroenterology

## 2012-07-02 VITALS — BP 145/96 | HR 117 | Temp 98.2°F | Ht 67.0 in | Wt 159.6 lb

## 2012-07-02 DIAGNOSIS — K219 Gastro-esophageal reflux disease without esophagitis: Secondary | ICD-10-CM

## 2012-07-02 DIAGNOSIS — J449 Chronic obstructive pulmonary disease, unspecified: Secondary | ICD-10-CM | POA: Insufficient documentation

## 2012-07-02 DIAGNOSIS — R1314 Dysphagia, pharyngoesophageal phase: Secondary | ICD-10-CM

## 2012-07-02 DIAGNOSIS — Z800 Family history of malignant neoplasm of digestive organs: Secondary | ICD-10-CM

## 2012-07-02 DIAGNOSIS — R1319 Other dysphagia: Secondary | ICD-10-CM

## 2012-07-02 DIAGNOSIS — R1310 Dysphagia, unspecified: Secondary | ICD-10-CM | POA: Insufficient documentation

## 2012-07-02 DIAGNOSIS — K5900 Constipation, unspecified: Secondary | ICD-10-CM | POA: Insufficient documentation

## 2012-07-02 DIAGNOSIS — I10 Essential (primary) hypertension: Secondary | ICD-10-CM | POA: Insufficient documentation

## 2012-07-02 DIAGNOSIS — IMO0001 Reserved for inherently not codable concepts without codable children: Secondary | ICD-10-CM | POA: Insufficient documentation

## 2012-07-02 DIAGNOSIS — E119 Type 2 diabetes mellitus without complications: Secondary | ICD-10-CM | POA: Insufficient documentation

## 2012-07-02 DIAGNOSIS — M6281 Muscle weakness (generalized): Secondary | ICD-10-CM | POA: Insufficient documentation

## 2012-07-02 DIAGNOSIS — M25669 Stiffness of unspecified knee, not elsewhere classified: Secondary | ICD-10-CM | POA: Insufficient documentation

## 2012-07-02 DIAGNOSIS — J4489 Other specified chronic obstructive pulmonary disease: Secondary | ICD-10-CM | POA: Insufficient documentation

## 2012-07-02 DIAGNOSIS — R262 Difficulty in walking, not elsewhere classified: Secondary | ICD-10-CM | POA: Insufficient documentation

## 2012-07-02 DIAGNOSIS — M25569 Pain in unspecified knee: Secondary | ICD-10-CM | POA: Insufficient documentation

## 2012-07-02 MED ORDER — PANTOPRAZOLE SODIUM 40 MG PO TBEC: 40.0000 mg | DELAYED_RELEASE_TABLET | Freq: Every day | ORAL | Status: DC

## 2012-07-02 MED ORDER — METFORMIN HCL 500 MG PO TABS: 500.0000 mg | ORAL_TABLET | Freq: Two times a day (BID) | ORAL | Status: DC

## 2012-07-02 MED ORDER — PEG 3350-KCL-NA BICARB-NACL 420 G PO SOLR: 4000.0000 mL | ORAL | Status: DC

## 2012-07-02 NOTE — Assessment & Plan Note (Addendum)
Uncontrolled GERD, esophageal dysphagia. Currently on Zantac. Previously failed omeprazole 20mg  daily. Stop Zantac. Start pantoprazole 40mg  daily. Recommend EGD/ED in near future. Patient recalls being awake at time of TCS. Give phenergan 25mg  IV 30 minutes before procedure to augment conscious sedation.  I have discussed the risks, alternatives, benefits with regards to but not limited to the risk of reaction to medication, bleeding, infection, perforation and the patient is agreeable to proceed. Written consent to be obtained.

## 2012-07-02 NOTE — Patient Instructions (Addendum)
We have scheduled you for an upper endoscopy and colonoscopy with Dr. Jena Gauss. Please see separate instructions.  Start pantoprazole for acid reflux. Take one daily before breakfast. Start metformin 500mg  twice a day before meal. You will need to get further refills from Dr. Dwana Melena after you establish care. If your sugar continues to be high, you need to go to ER. For constipation you should try Miralax one capful daily as needed.

## 2012-07-02 NOTE — Progress Notes (Signed)
Physical Therapy Treatment Patient Details  Name: Jimmy Duran MRN: [id number] Date of Birth: 07/13/1955  Today's Date: 07/02/2012 Time: 4098-1191 PT Time Calculation (min): 40 min  Visit#: 11 of 16  Re-eval: 07/07/12 Charges: Therex x 15' Manual x 20'   Subjective: Symptoms/Limitations Symptoms: Pt has been very sore since last session. Pain Assessment Currently in Pain?: Yes Pain Score:   2 (Pain increases intermittantly) Pain Location: Knee Pain Orientation: Left   Exercise/Treatments  Stretches Gastroc Stretch: 3 reps;30 seconds;Limitations Gastroc Stretch Limitations: slant board Aerobic Elliptical: NuStep: Hills #3, resistance 5, 10 minutes seat 6 goal for SPM >80 pt kept >100   Manual Therapy Other Manual Therapy: STM/MFR throughout left knee to decrease pain, tightness and spams.  Physical Therapy Assessment and Plan PT Assessment and Plan Clinical Impression Statement: Strenuous exercises were held this session as MD advised pt to "take it easy" secondary to increase pain. Manual techniques completed to left knee to decrease tightness, adhesions and spasms. Pt reports decrease pain at end of session. PT Plan: Resume strengthening activities for return to work as pain allows.     Problem List Patient Active Problem List   Diagnosis Date Noted  . Patellar tendinitis of left knee 08/07/2011  . H/O arthroscopy of left knee 07/09/2011  . Medial meniscus, posterior horn derangement 06/17/2011  . OA (osteoarthritis) of knee 06/17/2011  . Headache 11/16/2010  . GERD (gastroesophageal reflux disease) 05/04/2010  . Diabetes mellitus 05/04/2010  . COPD (chronic obstructive pulmonary disease) 05/04/2010  . Hemorrhoids 05/04/2010  . MIXED HYPERLIPIDEMIA 04/25/2010  . Tobacco use disorder 04/25/2010  . ESSENTIAL HYPERTENSION, BENIGN 04/25/2010  . CORONARY ATHEROSCLEROSIS NATIVE CORONARY ARTERY 04/25/2010  . CAROTID ARTERY DISEASE 04/25/2010    PT - End of  Session Activity Tolerance: Patient tolerated treatment well General Behavior During Therapy: [facility] for tasks assessed/performed Cognition: [facility] for tasks performed  Seth Bake, PTA  07/02/2012, 9:30 AM

## 2012-07-02 NOTE — Assessment & Plan Note (Signed)
Chronic constipation intermittently. Trial of Miralax 17g daily prn.

## 2012-07-02 NOTE — Progress Notes (Signed)
Primary Care Physician:  No primary provider on file. 5/21, Dr. Zack Hall Primary Gastroenterologist:  Michael Rourk, MD   Chief Complaint  Patient presents with  . Colonoscopy    HPI:  Jimmy Duran is a 56 y.o. male here to schedule his five year surveillance colonoscopy. Family history of CRC in multiple second degree relatives. Personal history of colon polyps in the past. Last TCS 2008, minimally friable anal canal hemorrhoids.   Stomach hurts every day. Bad acid reflux. Some choking while asleep. Could not afford the Nexium. Right now on Zantac. Dysphagia to meats/breads. Daily heartburn. Epigastric pain sometimes worse with meals. No medications make it better. Used to do well when on Nexium. Intermittent constipation. No melena, brbpr. Currently on prednisone for inflammation of the knee/nerve. Out of metformin since 04/2012. Sugars usually between 300-500. Appointment with Dr. Zack Hall to establish care latter part of this month.   Current Outpatient Prescriptions  Medication Sig Dispense Refill  . aspirin EC 325 MG EC tablet Take 1 tablet (325 mg total) by mouth daily.      . diphenhydrAMINE (BENADRYL) 25 MG tablet Take 25 mg by mouth every morning.      . fish oil-omega-3 fatty acids 1000 MG capsule Take 2 g by mouth 2 (two) times daily.       . gabapentin (NEURONTIN) 100 MG capsule Take 1 capsule (100 mg total) by mouth 3 (three) times daily.  90 capsule  2  . HYDROcodone-acetaminophen (NORCO) 10-325 MG per tablet Take 1 tablet by mouth every 6 (six) hours as needed for pain.  60 tablet  2  . lisinopril-hydrochlorothiazide (PRINZIDE,ZESTORETIC) 20-12.5 MG per tablet Take 1 tablet by mouth 2 (two) times daily.  60 tablet  11  . methocarbamol (ROBAXIN) 500 MG tablet Take 1 tablet (500 mg total) by mouth 4 (four) times daily.  56 tablet  2  .        . PredniSONE 10 MG KIT 10mg for 12 days as directed  1 kit  0  . ranitidine (ZANTAC) 150 MG capsule Take 300 mg by mouth every  morning.       . simvastatin (ZOCOR) 40 MG tablet Take 40 mg by mouth every evening.       No current facility-administered medications for this visit.    Allergies as of 07/02/2012  . (No Known Allergies)    Past Medical History  Diagnosis Date  . Coronary artery disease     Nonobstructive  . Type 2 diabetes mellitus   . Mixed hyperlipidemia   . Essential hypertension, benign   . Carotid artery disease   . COPD (chronic obstructive pulmonary disease)   . GERD (gastroesophageal reflux disease)   . Hemorrhoids   . Diabetes mellitus   . Stroke     "ministrokes"  . Arthritis     Past Surgical History  Procedure Laterality Date  . Appendectomy  1970  . Cholecystectomy  1993  . Umbilical hernia repair  1993  . Breast surgery      Cyst resection on the right  . Knee surgery      Left knee arthroscopy torn medial meniscus grade 4 chondral changes medial femoral condyle tibial plateau  . Total knee arthroplasty Left 04/27/2012    Procedure: TOTAL KNEE ARTHROPLASTY;  Surgeon: Stanley E Harrison, MD;  Location: [facility];  Service: Orthopedics;  Laterality: Left;  . Colonoscopy  02/12/2007    RMR:Minimally friable anal canal hemorrhoids, otherwise normal rectum and colon      Family History  Problem Relation Age of Onset  . Coronary artery disease Father     CABG in his 60's    History   Social History  . Marital Status: Married    Spouse Name: N/A    Number of Children: N/A  . Years of Education: N/A   Occupational History  . [employer]    Social History Main Topics  . Smoking status: Current Every Day Smoker -- 0.50 packs/day for 30 years    Types: Cigarettes  . Smokeless tobacco: Current User    Types: Chew  . Alcohol Use: Yes     Comment: Socially  . Drug Use: No  . Sexually Active: Yes    Birth Control/ Protection: None   Other Topics Concern  . Not on file   Social History Narrative  . No narrative on file      ROS:  General: Negative for  anorexia, weight loss, fever, chills, fatigue, weakness. Eyes: Negative for vision changes.  ENT: Negative for hoarseness, nasal congestion. CV: Negative for chest pain, angina, palpitations, dyspnea on exertion, peripheral edema.  Respiratory: Negative for dyspnea at rest, dyspnea on exertion, cough, sputum, wheezing.  GI: See history of present illness. GU:  Negative for dysuria, hematuria, urinary incontinence, urinary frequency, nocturnal urination.  MS: Positive for left knee pain but improving since knee replacement. Negative for low back pain.  Derm: Negative for rash or itching.  Neuro: Negative for weakness, abnormal sensation, seizure, frequent headaches, memory loss, confusion.  Psych: Negative for anxiety, depression, suicidal ideation, hallucinations.  Endo: Negative for unusual weight change.  Heme: Negative for bruising or bleeding. Allergy: Negative for rash or hives.    Physical Examination:  BP 145/96  Pulse 117  Temp(Src) 98.2 F (36.8 C) (Oral)  Ht 5' 7" (1.702 m)  Wt 159 lb 9.6 oz (72.394 kg)  BMI 24.99 kg/m2   General: Well-nourished, well-developed in no acute distress. Accompanied by wife.  Head: Normocephalic, atraumatic.   Eyes: Conjunctiva pink, no icterus. Mouth: Oropharyngeal mucosa moist and pink , no lesions erythema or exudate. Neck: Supple without thyromegaly, masses, or lymphadenopathy.  Lungs: Clear to auscultation bilaterally.  Heart: Regular rate and rhythm, no murmurs rubs or gallops.  Abdomen: Bowel sounds are normal, nontender, nondistended, no hepatosplenomegaly or masses, no abdominal bruits or    hernia , no rebound or guarding.   Rectal: not performed Extremities: No lower extremity edema. No clubbing or deformities.  Neuro: Alert and oriented x 4 , grossly normal neurologically.  Skin: Warm and dry, no rash or jaundice.   Psych: Alert and cooperative, normal mood and affect.  Labs: Lab Results  Component Value Date   WBC 10.0  04/30/2012   HGB 13.6 04/30/2012   HCT 38.7* 04/30/2012   MCV 93.5 04/30/2012   PLT 317 04/30/2012      Imaging Studies: No results found.    

## 2012-07-02 NOTE — Assessment & Plan Note (Signed)
Due colonoscopy. Augment conscious sedation with Phenergan 25mg  IV 30 minutes before given history of being awake at time of colonoscopy.  I have discussed the risks, alternatives, benefits with regards to but not limited to the risk of reaction to medication, bleeding, infection, perforation and the patient is agreeable to proceed. Written consent to be obtained.

## 2012-07-02 NOTE — Assessment & Plan Note (Signed)
Patient has appointment to see Dr. Dwana Melena in three weeks. Previously on metformin 500mg  BID. RX provided to last until OV. He should continue CBGs.

## 2012-07-03 NOTE — Progress Notes (Signed)
Cc PCP 

## 2012-07-03 NOTE — Progress Notes (Signed)
Cc to Dr. Margo Aye

## 2012-07-06 ENCOUNTER — Encounter: Payer: Self-pay | Admitting: *Deleted

## 2012-07-06 ENCOUNTER — Telehealth: Payer: Self-pay | Admitting: Orthopedic Surgery

## 2012-07-06 ENCOUNTER — Telehealth: Payer: Self-pay

## 2012-07-06 NOTE — Telephone Encounter (Addendum)
Doris at Dr. Aura Fey office called about Jimmy Duran.  He is scheduled to have TCS, EGD, and ED and she is asking if you recommend he have an  Antibiotic since he has had a knee replacement,  and if yes what do you recommend Dr. Kendell Bane to give. Doris' # [phone]

## 2012-07-06 NOTE — Telephone Encounter (Signed)
Ancef 2 gms iv 30 min before

## 2012-07-06 NOTE — Telephone Encounter (Signed)
Doctor's reply given to [employer].

## 2012-07-06 NOTE — Telephone Encounter (Deleted)
Total  

## 2012-07-06 NOTE — Telephone Encounter (Signed)
Doris from Dr. Aura Fey office called about Paticia Stack.  Ceasar is scheduled to have TCS, EGD, and ED.  Since he has had a total knee replacement, Do you recommend Dr. Kendell Bane give an antibiotic for the procedures?  If yes, what do you recommend? Doris's # [phone]

## 2012-07-06 NOTE — Telephone Encounter (Signed)
Called and spoke to [place] at Dr. Mort Sawyers. She will send him a note and ask if he recommends antibiotics at the time of TCS/EGD since pt recently had a total knee replacement. She will call us back.

## 2012-07-06 NOTE — Telephone Encounter (Signed)
Steward Drone returned call and said Dr. Romeo Apple said to give Ancef 2 gms IV  30 min before procedure.

## 2012-07-06 NOTE — Telephone Encounter (Signed)
Dis regard this note, error 

## 2012-07-07 ENCOUNTER — Ambulatory Visit (HOSPITAL_COMMUNITY)
Admission: RE | Admit: 2012-07-07 | Discharge: 2012-07-07 | Disposition: A | Discharge status: Home / Self Care | Payer: BC Managed Care – PPO | Source: Ambulatory Visit | Attending: Orthopedic Surgery | Admitting: Orthopedic Surgery

## 2012-07-07 NOTE — Addendum Note (Signed)
Addended by: Lavena Bullion on: 07/07/2012 02:29 PM   Modules accepted: Orders

## 2012-07-07 NOTE — Progress Notes (Signed)
Physical Therapy Treatment Patient Details  Name: Jimmy Duran MRN: [id number] Date of Birth: [birth year]  Today's Date: 07/07/2012 Time: 0840-0925 PT Time Calculation (min): 45 min Visit#: 12 of 16  Re-eval: 07/07/12 Charges:  therex 15', manual 25'  Subjective: Symptoms/Limitations Symptoms: Pt. states his nerve pain is better since MD gave him meds.  States he is still tight/hurting medial Lt knee and into quad mm. Pain Assessment Currently in Pain?: Yes Pain Score:   2 Pain Location: Knee Pain Orientation: Left Pain Type: Acute pain   Exercise/Treatments Stretches Gastroc Stretch: 3 reps;30 seconds;Limitations Gastroc Stretch Limitations: slant board Aerobic Elliptical: NuStep: Hills #3, resistance 5, 10 minutes seat 6 goal for SPM >80 pt kept >100    Modalities Modalities: Cryotherapy Manual Therapy Manual Therapy: Other (comment) Other Manual Therapy: STM/MFR left knee concentrating on medial knee and quadricep to decrease pain, tightness and spams, sciatic nerve glides to LT LE  Physical Therapy Assessment and Plan PT Assessment and Plan Clinical Impression Statement: Continued focus on pain control, resolving spasms and adhesions.  Several spasms into quad area decreased with manual techniques.  Added sciatic nerve glides to decrease discomfort.  Overall improvement without c/o pain at end of session. PT Plan: Resume strengthening activities for return to work as pain allows.       PT - End of Session Activity Tolerance: Patient tolerated treatment well General Behavior During Therapy: WFL for tasks assessed/performed Cognition: WFL for tasks performed   Lurena Nida, PTA/CLT 07/07/2012, 9:31 AM

## 2012-07-07 NOTE — Telephone Encounter (Signed)
Jimmy Duran is aware. She said to call pt and tell him to be there 1and 1/2 hour prior to time for procedure since he will need the antibiotics. I called. Many rings and no answer. ( He will need to be there at 11:45 AM in stead of 12:15 PM ).

## 2012-07-07 NOTE — Telephone Encounter (Signed)
Orders entered for antibiotics.

## 2012-07-07 NOTE — Telephone Encounter (Signed)
Please arrange for patient to get Ancef 2 grams IV 30 minutes before his TCS/EGD (requested by Dr. Romeo Apple due to recent knee replacement). Discussed with Audelia Acton, pharmacist at [facility].

## 2012-07-08 ENCOUNTER — Telehealth (HOSPITAL_COMMUNITY): Payer: Self-pay

## 2012-07-09 ENCOUNTER — Ambulatory Visit (HOSPITAL_COMMUNITY)
Admission: RE | Admit: 2012-07-09 | Discharge: 2012-07-09 | Disposition: A | Discharge status: Home / Self Care | Payer: BC Managed Care – PPO | Source: Ambulatory Visit | Attending: Orthopedic Surgery | Admitting: Orthopedic Surgery

## 2012-07-09 NOTE — Progress Notes (Signed)
Physical Therapy Treatment Patient Details  Name: Jimmy Duran MRN: [id number] Date of Birth: Nov 04, 1955  Today's Date: 07/09/2012 Time: 1478-2956 PT Time Calculation (min): 46 min Visit#: 13 of 16  Re-eval: 07/07/12 Charges:  therex 18', manual 25'  Subjective: Symptoms/Limitations Symptoms: Pt states his symptoms are basically the same, a little higher pain level as last visit.   Pt .states he was moving a refrigerator on a handtruck and it fell back and he had to catch it and may have increased his symptoms.  Pain Assessment Currently in Pain?: Yes Pain Score:   4 Pain Location: Knee Pain Orientation: Left Pain Radiating Towards: L tailbone to knee   Exercise/Treatments Aerobic Elliptical: NuStep: Hills #3, resistance 5, 10 minutes seat 6 goal for SPM >80 pt kept >100 Prone  Other Prone Exercises: POE 3', press ups 5 reps, opposite arm/leg 5 reps alternating   Modalities Modalities: Cryotherapy Manual Therapy Manual Therapy: Other (comment) Other Manual Therapy: STM/MFR left knee concentrating on medial knee and quadricep to decrease pain, tightness and spams, sciatic nerve glides to LT LE  Physical Therapy Assessment and Plan PT Assessment and Plan Clinical Impression Statement: added lumbar stability and extension per evaluating PT exercises to see if affects nerve pain posterior LLE.   Less tighness and spasms in quad and perimeter of knee noted today.  PT Plan: Resume strengthening activities for return to work as pain allows.     PT - End of Session Activity Tolerance: Patient tolerated treatment well General Behavior During Therapy: WFL for tasks assessed/performed Cognition: WFL for tasks performed   Lurena Nida, PTA/CLT 07/09/2012, 9:30 AM

## 2012-07-13 NOTE — Telephone Encounter (Signed)
Pt's wife returned call and was informed of the arrival time of 11:45 AM on 07/16/2012.

## 2012-07-13 NOTE — Telephone Encounter (Signed)
Tried to call pt. Could not leave a message. Mailed a letter to remind him to be at the hospital at 11:45 AM on 07/16/2012.

## 2012-07-14 ENCOUNTER — Telehealth (HOSPITAL_COMMUNITY): Payer: Self-pay

## 2012-07-14 ENCOUNTER — Ambulatory Visit (HOSPITAL_COMMUNITY): Payer: BC Managed Care – PPO | Admitting: Physical Therapy

## 2012-07-16 ENCOUNTER — Ambulatory Visit (HOSPITAL_COMMUNITY)
Admission: RE | Admit: 2012-07-16 | Discharge: 2012-07-16 | Disposition: A | Discharge status: Home / Self Care | Payer: BC Managed Care – PPO | Source: Ambulatory Visit | Attending: Internal Medicine | Admitting: Internal Medicine

## 2012-07-16 ENCOUNTER — Ambulatory Visit (HOSPITAL_COMMUNITY): Payer: BC Managed Care – PPO

## 2012-07-16 ENCOUNTER — Encounter (HOSPITAL_COMMUNITY)
Admission: RE | Disposition: A | Discharge status: Home / Self Care | Payer: Self-pay | Source: Ambulatory Visit | Attending: Internal Medicine

## 2012-07-16 ENCOUNTER — Encounter (HOSPITAL_COMMUNITY): Payer: Self-pay | Admitting: *Deleted

## 2012-07-16 DIAGNOSIS — Z79899 Other long term (current) drug therapy: Secondary | ICD-10-CM | POA: Insufficient documentation

## 2012-07-16 DIAGNOSIS — Q391 Atresia of esophagus with tracheo-esophageal fistula: Secondary | ICD-10-CM | POA: Insufficient documentation

## 2012-07-16 DIAGNOSIS — Z01812 Encounter for preprocedural laboratory examination: Secondary | ICD-10-CM | POA: Insufficient documentation

## 2012-07-16 DIAGNOSIS — K5900 Constipation, unspecified: Secondary | ICD-10-CM

## 2012-07-16 DIAGNOSIS — K2100 Gastro-esophageal reflux disease with esophagitis, without bleeding: Secondary | ICD-10-CM

## 2012-07-16 DIAGNOSIS — Z860100 Personal history of colon polyps, unspecified: Secondary | ICD-10-CM | POA: Insufficient documentation

## 2012-07-16 DIAGNOSIS — F17200 Nicotine dependence, unspecified, uncomplicated: Secondary | ICD-10-CM | POA: Insufficient documentation

## 2012-07-16 DIAGNOSIS — I6529 Occlusion and stenosis of unspecified carotid artery: Secondary | ICD-10-CM | POA: Insufficient documentation

## 2012-07-16 DIAGNOSIS — Z800 Family history of malignant neoplasm of digestive organs: Secondary | ICD-10-CM

## 2012-07-16 DIAGNOSIS — K228 Other specified diseases of esophagus: Secondary | ICD-10-CM | POA: Insufficient documentation

## 2012-07-16 DIAGNOSIS — Z1211 Encounter for screening for malignant neoplasm of colon: Secondary | ICD-10-CM

## 2012-07-16 DIAGNOSIS — Z7982 Long term (current) use of aspirin: Secondary | ICD-10-CM | POA: Insufficient documentation

## 2012-07-16 DIAGNOSIS — E119 Type 2 diabetes mellitus without complications: Secondary | ICD-10-CM | POA: Insufficient documentation

## 2012-07-16 DIAGNOSIS — IMO0002 Reserved for concepts with insufficient information to code with codable children: Secondary | ICD-10-CM | POA: Insufficient documentation

## 2012-07-16 DIAGNOSIS — I10 Essential (primary) hypertension: Secondary | ICD-10-CM | POA: Insufficient documentation

## 2012-07-16 DIAGNOSIS — K319 Disease of stomach and duodenum, unspecified: Secondary | ICD-10-CM | POA: Insufficient documentation

## 2012-07-16 DIAGNOSIS — J4489 Other specified chronic obstructive pulmonary disease: Secondary | ICD-10-CM | POA: Insufficient documentation

## 2012-07-16 DIAGNOSIS — J449 Chronic obstructive pulmonary disease, unspecified: Secondary | ICD-10-CM | POA: Insufficient documentation

## 2012-07-16 DIAGNOSIS — Z8673 Personal history of transient ischemic attack (TIA), and cerebral infarction without residual deficits: Secondary | ICD-10-CM | POA: Insufficient documentation

## 2012-07-16 DIAGNOSIS — K449 Diaphragmatic hernia without obstruction or gangrene: Secondary | ICD-10-CM | POA: Insufficient documentation

## 2012-07-16 DIAGNOSIS — R1310 Dysphagia, unspecified: Secondary | ICD-10-CM | POA: Insufficient documentation

## 2012-07-16 DIAGNOSIS — D126 Benign neoplasm of colon, unspecified: Secondary | ICD-10-CM

## 2012-07-16 DIAGNOSIS — E782 Mixed hyperlipidemia: Secondary | ICD-10-CM | POA: Insufficient documentation

## 2012-07-16 DIAGNOSIS — M129 Arthropathy, unspecified: Secondary | ICD-10-CM | POA: Insufficient documentation

## 2012-07-16 DIAGNOSIS — K222 Esophageal obstruction: Secondary | ICD-10-CM

## 2012-07-16 DIAGNOSIS — K5730 Diverticulosis of large intestine without perforation or abscess without bleeding: Secondary | ICD-10-CM

## 2012-07-16 DIAGNOSIS — K2960 Other gastritis without bleeding: Secondary | ICD-10-CM

## 2012-07-16 DIAGNOSIS — R1319 Other dysphagia: Secondary | ICD-10-CM

## 2012-07-16 DIAGNOSIS — K2289 Other specified disease of esophagus: Secondary | ICD-10-CM | POA: Insufficient documentation

## 2012-07-16 DIAGNOSIS — K219 Gastro-esophageal reflux disease without esophagitis: Secondary | ICD-10-CM | POA: Insufficient documentation

## 2012-07-16 DIAGNOSIS — I2510 Atherosclerotic heart disease of native coronary artery without angina pectoris: Secondary | ICD-10-CM | POA: Insufficient documentation

## 2012-07-16 HISTORY — PX: ESOPHAGOGASTRODUODENOSCOPY (EGD) WITH ESOPHAGEAL DILATION: SHX5812

## 2012-07-16 HISTORY — PX: COLONOSCOPY: SHX5424

## 2012-07-16 LAB — KOH PREP

## 2012-07-16 LAB — GLUCOSE, CAPILLARY: Glucose-Capillary: 197 mg/dL — ABNORMAL HIGH (ref 70–99)

## 2012-07-16 SURGERY — COLONOSCOPY
Anesthesia: Moderate Sedation

## 2012-07-16 MED ORDER — ONDANSETRON HCL 4 MG/2ML IJ SOLN
INTRAMUSCULAR | Status: DC | PRN
  Administered 2012-07-16: 4 mg via INTRAVENOUS

## 2012-07-16 MED ORDER — MEPERIDINE HCL 100 MG/ML IJ SOLN
INTRAMUSCULAR | Status: DC | PRN
  Administered 2012-07-16 (×2): 50 mg via INTRAVENOUS

## 2012-07-16 MED ORDER — ONDANSETRON HCL 4 MG/2ML IJ SOLN
INTRAMUSCULAR | Status: AC
  Filled 2012-07-16: qty 2

## 2012-07-16 MED ORDER — STERILE WATER FOR IRRIGATION IR SOLN
Status: DC | PRN
  Administered 2012-07-16: 14:00:00

## 2012-07-16 MED ORDER — PROMETHAZINE HCL 25 MG/ML IJ SOLN
INTRAMUSCULAR | Status: AC
  Filled 2012-07-16: qty 1

## 2012-07-16 MED ORDER — MIDAZOLAM HCL 5 MG/5ML IJ SOLN
INTRAMUSCULAR | Status: AC
  Filled 2012-07-16: qty 10

## 2012-07-16 MED ORDER — CEFAZOLIN SODIUM-DEXTROSE 2-3 GM-% IV SOLR
INTRAVENOUS | Status: AC
  Filled 2012-07-16: qty 50

## 2012-07-16 MED ORDER — SODIUM CHLORIDE 0.9 % IV SOLN
INTRAVENOUS | Status: DC
  Administered 2012-07-16: 13:00:00 via INTRAVENOUS

## 2012-07-16 MED ORDER — CEFAZOLIN SODIUM-DEXTROSE 2-3 GM-% IV SOLR
2.0000 g | Freq: Once | INTRAVENOUS | Status: AC
  Administered 2012-07-16: 2 g via INTRAVENOUS

## 2012-07-16 MED ORDER — BUTAMBEN-TETRACAINE-BENZOCAINE 2-2-14 % EX AERO
INHALATION_SPRAY | CUTANEOUS | Status: DC | PRN
  Administered 2012-07-16: 2 via TOPICAL

## 2012-07-16 MED ORDER — MIDAZOLAM HCL 5 MG/5ML IJ SOLN
INTRAMUSCULAR | Status: DC | PRN
  Administered 2012-07-16: 2 mg via INTRAVENOUS
  Administered 2012-07-16 (×2): 1 mg via INTRAVENOUS
  Administered 2012-07-16: 2 mg via INTRAVENOUS

## 2012-07-16 MED ORDER — SODIUM CHLORIDE 0.9 % IJ SOLN
INTRAMUSCULAR | Status: AC
  Filled 2012-07-16: qty 10

## 2012-07-16 MED ORDER — MEPERIDINE HCL 100 MG/ML IJ SOLN
INTRAMUSCULAR | Status: AC
  Filled 2012-07-16: qty 1

## 2012-07-16 MED ORDER — PROMETHAZINE HCL 25 MG/ML IJ SOLN
25.0000 mg | Freq: Once | INTRAMUSCULAR | Status: AC
  Administered 2012-07-16: 25 mg via INTRAVENOUS

## 2012-07-16 NOTE — OR Nursing (Signed)
Versed 3mg  was noted to be wasted in the pyxis when an actual dose of 4 mg was really wasted

## 2012-07-16 NOTE — Op Note (Signed)
[facility] [address] [place], [address]   COLONOSCOPY PROCEDURE REPORT  PATIENT: Jimmy Duran, Jimmy Duran  MR#:         [id number] BIRTHDATE: [birth year] , 56  yrs. old GENDER: Male ENDOSCOPIST: R.  Roetta Sessions, MD FACP FACG REFERRED BY:  Catalina Pizza, M.D. PROCEDURE DATE:  07/16/2012 PROCEDURE:     Colonoscopy with snare polypectomy  INDICATIONS: History of colonic adenoma; positive family history of colon cancer  INFORMED CONSENT:  The risks, benefits, alternatives and imponderables including but not limited to bleeding, perforation as well as the possibility of a missed lesion have been reviewed.  The potential for biopsy, lesion removal, etc. have also been discussed.  Questions have been answered.  All parties agreeable. Please see the history and physical in the medical record for more information.  MEDICATIONS: Versed 6 mg IV and Demerol 100 mg IV in divided doses. Zofran 4 mg IV DESCRIPTION OF PROCEDURE:  After a digital rectal exam was performed, the EC-[id number] ([id number])  colonoscope was advanced from the anus through the rectum and colon to the area of the cecum, ileocecal valve and appendiceal orifice.  The cecum was deeply intubated.  These structures were well-seen and photographed for the record.  From the level of the cecum and ileocecal valve, the scope was slowly and cautiously withdrawn.  The mucosal surfaces were carefully surveyed utilizing scope tip deflection to facilitate fold flattening as needed.  The scope was pulled down into the rectum where a thorough examination including retroflexion was performed.    FINDINGS:  Adequate preparation. Normal rectum. Scattered pancolonic diverticula; (1) 4 mm polyp opposite the ileocecal valve; otherwise, the remainder of the colonic mucosa appeared normal.  THERAPEUTIC / DIAGNOSTIC MANEUVERS PERFORMED:  The above-mentioned polyps was cold snare removed and  recovered.  COMPLICATIONS: None  CECAL WITHDRAWAL TIME:  8 minutes  IMPRESSION:  Colonic diverticulosis. Colonic polyp-removed as described above  RECOMMENDATIONS: Follow up on pathology. See EGD report.   _______________________________ eSigned:  R. Roetta Sessions, MD FACP [facility] 07/16/2012 2:52 PM   CC:

## 2012-07-16 NOTE — Interval H&P Note (Signed)
History and Physical Interval Note:  07/16/2012 2:12 PM  Jimmy Duran  has presented today for surgery, with the diagnosis of DYSPHAGIA, CHRONIC GERD, CONSTIPATION AND FAMILY HX OF COLON CANCER  The various methods of treatment have been discussed with the patient and family. After consideration of risks, benefits and other options for treatment, the patient has consented to  Procedure(s) with comments: COLONOSCOPY (N/A) - 1:15-moved to [address] to notify pt ESOPHAGOGASTRODUODENOSCOPY (EGD) WITH ESOPHAGEAL DILATION (N/A) as a surgical intervention .  The patient's history has been reviewed, patient examined, no change in status, stable for surgery.  I have reviewed the patient's chart and labs.  Questions were answered to the patient's satisfaction.      EGD with dilation as appropriate and colonoscopy per plan. 2 weeks worth of pantoprazole has already helped reflux symptoms considerably as patient reports.The risks, benefits, limitations, imponderables and alternatives regarding both EGD and colonoscopy have been reviewed with the patient. Questions have been answered. All parties agreeable.    Eula Listen

## 2012-07-16 NOTE — H&P (View-Only) (Signed)
Primary Care Physician:  No primary provider on file. 5/21, Dr. Dwana Melena Primary Gastroenterologist:  Roetta Sessions, MD   Chief Complaint  Patient presents with  . Colonoscopy    HPI:  Jimmy Duran is a 57 y.o. male here to schedule his five year surveillance colonoscopy. Family history of CRC in multiple second degree relatives. Personal history of colon polyps in the past. Last TCS 2008, minimally friable anal canal hemorrhoids.   Stomach hurts every day. Bad acid reflux. Some choking while asleep. Could not afford the Nexium. Right now on Zantac. Dysphagia to meats/breads. Daily heartburn. Epigastric pain sometimes worse with meals. No medications make it better. Used to do well when on Nexium. Intermittent constipation. No melena, brbpr. Currently on prednisone for inflammation of the knee/nerve. Out of metformin since 04/2012. Sugars usually between 300-500. Appointment with Dr. Dwana Melena to establish care latter part of this month.   Current Outpatient Prescriptions  Medication Sig Dispense Refill  . aspirin EC 325 MG EC tablet Take 1 tablet (325 mg total) by mouth daily.      . diphenhydrAMINE (BENADRYL) 25 MG tablet Take 25 mg by mouth every morning.      . fish oil-omega-3 fatty acids 1000 MG capsule Take 2 g by mouth 2 (two) times daily.       Marland Kitchen gabapentin (NEURONTIN) 100 MG capsule Take 1 capsule (100 mg total) by mouth 3 (three) times daily.  90 capsule  2  . HYDROcodone-acetaminophen (NORCO) 10-325 MG per tablet Take 1 tablet by mouth every 6 (six) hours as needed for pain.  60 tablet  2  . lisinopril-hydrochlorothiazide (PRINZIDE,ZESTORETIC) 20-12.5 MG per tablet Take 1 tablet by mouth 2 (two) times daily.  60 tablet  11  . methocarbamol (ROBAXIN) 500 MG tablet Take 1 tablet (500 mg total) by mouth 4 (four) times daily.  56 tablet  2  .        . PredniSONE 10 MG KIT 10mg  for 12 days as directed  1 kit  0  . ranitidine (ZANTAC) 150 MG capsule Take 300 mg by mouth every  morning.       . simvastatin (ZOCOR) 40 MG tablet Take 40 mg by mouth every evening.       No current facility-administered medications for this visit.    Allergies as of 07/02/2012  . (No Known Allergies)    Past Medical History  Diagnosis Date  . Coronary artery disease     Nonobstructive  . Type 2 diabetes mellitus   . Mixed hyperlipidemia   . Essential hypertension, benign   . Carotid artery disease   . COPD (chronic obstructive pulmonary disease)   . GERD (gastroesophageal reflux disease)   . Hemorrhoids   . Diabetes mellitus   . Stroke     "ministrokes"  . Arthritis     Past Surgical History  Procedure Laterality Date  . Appendectomy  1970  . Cholecystectomy  1993  . Umbilical hernia repair  1993  . Breast surgery      Cyst resection on the right  . Knee surgery      Left knee arthroscopy torn medial meniscus grade 4 chondral changes medial femoral condyle tibial plateau  . 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 Colonoscopy  02/12/2007    [id number] friable anal canal hemorrhoids, otherwise normal rectum and colon  Family History  Problem Relation Age of Onset  . Coronary artery disease Father     CABG in his 69's    History   Social History  . Marital Status: Married    Spouse Name: N/A    Number of Children: N/A  . Years of Education: N/A   Occupational History  . [employer]    Social History Main Topics  . Smoking status: Current Every Day Smoker -- 0.50 packs/day for 30 years    Types: Cigarettes  . Smokeless tobacco: Current User    Types: Chew  . Alcohol Use: Yes     Comment: Socially  . Drug Use: No  . Sexually Active: Yes    Birth Control/ Protection: None   Other Topics Concern  . Not on file   Social History Narrative  . No narrative on file      ROS:  General: Negative for  anorexia, weight loss, fever, chills, fatigue, weakness. Eyes: Negative for vision changes.  ENT: Negative for hoarseness, nasal congestion. CV: Negative for chest pain, angina, palpitations, dyspnea on exertion, peripheral edema.  Respiratory: Negative for dyspnea at rest, dyspnea on exertion, cough, sputum, wheezing.  GI: See history of present illness. GU:  Negative for dysuria, hematuria, urinary incontinence, urinary frequency, nocturnal urination.  MS: Positive for left knee pain but improving since knee replacement. Negative for low back pain.  Derm: Negative for rash or itching.  Neuro: Negative for weakness, abnormal sensation, seizure, frequent headaches, memory loss, confusion.  Psych: Negative for anxiety, depression, suicidal ideation, hallucinations.  Endo: Negative for unusual weight change.  Heme: Negative for bruising or bleeding. Allergy: Negative for rash or hives.    Physical Examination:  BP 145/96  Pulse 117  Temp(Src) 98.2 F (36.8 C) (Oral)  Ht 5\' 7"  (1.702 m)  Wt 159 lb 9.6 oz (72.394 kg)  BMI 24.99 kg/m2   General: Well-nourished, well-developed in no acute distress. Accompanied by wife.  Head: Normocephalic, atraumatic.   Eyes: Conjunctiva pink, no icterus. Mouth: Oropharyngeal mucosa moist and pink , no lesions erythema or exudate. Neck: Supple without thyromegaly, masses, or lymphadenopathy.  Lungs: Clear to auscultation bilaterally.  Heart: Regular rate and rhythm, no murmurs rubs or gallops.  Abdomen: Bowel sounds are normal, nontender, nondistended, no hepatosplenomegaly or masses, no abdominal bruits or    hernia , no rebound or guarding.   Rectal: not performed Extremities: No lower extremity edema. No clubbing or deformities.  Neuro: Alert and oriented x 4 , grossly normal neurologically.  Skin: Warm and dry, no rash or jaundice.   Psych: Alert and cooperative, normal mood and affect.  Labs: Lab Results  Component Value Date   WBC 10.0  04/30/2012   HGB 13.6 04/30/2012   HCT 38.7* 04/30/2012   MCV 93.5 04/30/2012   PLT 317 04/30/2012      Imaging Studies: No results found.

## 2012-07-16 NOTE — Op Note (Signed)
[facility] [address] [place], [address]   ENDOSCOPY PROCEDURE REPORT  PATIENT: Jimmy Duran, Jimmy Duran  MR#: [id number] BIRTHDATE: 12/27/1955 , 56  yrs. old GENDER: Male ENDOSCOPIST: R.  Roetta Sessions, MD FACP FACG REFERRED BY:  Catalina Pizza, M.D. PROCEDURE DATE:  07/16/2012 PROCEDURE:     EGD with [name] dilation followed by KOH esophageal brushing and gastric biopsy  INDICATIONS:     Esophageal dysphagia; GERD  INFORMED CONSENT:   The risks, benefits, limitations, alternatives and imponderables have been discussed.  The potential for biopsy, esophogeal dilation, etc. have also been reviewed.  Questions have been answered.  All parties agreeable.  Please see the history and physical in the medical record for more information.  MEDICATIONS:  Versed 5 mg IV and Demerol 100 mg IV in divided doses. Zofran 4 mg IV. Cetacaine spray.  DESCRIPTION OF PROCEDURE:   The EG-[id number] ([id number])  endoscope was introduced through the mouth and advanced to the second portion of the duodenum without difficulty or limitations.  The mucosal surfaces were surveyed very carefully during advancement of the scope and upon withdrawal.  Retroflexion view of the proximal stomach and esophagogastric junction was performed.      FINDINGS:  Whitish plaques scattered  throughout the proximal and mid esophageal body. There would not rub off easily at all. Distal esophageal erosions straddling the EG junction. Noncritical Schatzki's ring. No Barrett's esophagus. Stomach empty. 3 cm hiatal hernia. Antral erosions. No ulcer or infiltrating process. Patent pylorus. Normal first and second portion of the duodenum.  THERAPEUTIC / DIAGNOSTIC MANEUVERS PERFORMED:  A 56 French Maloney dilator was passed to full insertion easily. A look back revealed no apparent complication related to this maneuver. Subsequently, the abnormal antrum was biopsied for histologic study. Finally, the abnormal  esophagus was brushed for KOH prep.   COMPLICATIONS:  None  IMPRESSION:   Abnormal esophagus suspicious for Candida-status post KOH brushing. Erosive reflux esophagitis. Schatzki's ring day status post biopsy. Hiatal hernia. Antral erosions status post biopsy  RECOMMENDATIONS:  Continue Protonix 40 mg once daily.Followup on pending studies. See colonoscopy report.    _______________________________ R. Roetta Sessions, MD FACP [facility] eSigned:  R. Roetta Sessions, MD FACP [facility] 07/16/2012 2:38 PM     CC:  PATIENT NAME:  Gregroy, Dombkowski MR#: [id number]

## 2012-07-17 ENCOUNTER — Encounter (HOSPITAL_COMMUNITY): Payer: Self-pay | Admitting: Internal Medicine

## 2012-07-17 ENCOUNTER — Ambulatory Visit (HOSPITAL_COMMUNITY): Payer: BC Managed Care – PPO

## 2012-07-20 ENCOUNTER — Telehealth: Payer: Self-pay | Admitting: Internal Medicine

## 2012-07-20 MED ORDER — FLUCONAZOLE 150 MG PO TABS: 300.0000 mg | ORAL_TABLET | Freq: Once | ORAL | Status: DC

## 2012-07-20 NOTE — Telephone Encounter (Signed)
Pt's wife called this morning to ask who called them on Friday. They have caller ID but no answering machine. I seen where pt had procedure on the 15th but didn't see any documentation of anyone calling on Friday. She said the number came from here. Please advise and call pt at [phone]

## 2012-07-20 NOTE — Telephone Encounter (Signed)
I have spoken with pt and sent in his rx.

## 2012-07-20 NOTE — Progress Notes (Signed)
Quick Note:  Pt is aware. rx sent to pharmacy ______

## 2012-07-20 NOTE — Telephone Encounter (Signed)
This is RMR pt. Routing to [place].

## 2012-07-21 ENCOUNTER — Ambulatory Visit (HOSPITAL_COMMUNITY)
Admission: RE | Admit: 2012-07-21 | Discharge: 2012-07-21 | Disposition: A | Discharge status: Home / Self Care | Payer: BC Managed Care – PPO | Source: Ambulatory Visit | Attending: Pulmonary Disease | Admitting: Pulmonary Disease

## 2012-07-21 NOTE — Progress Notes (Signed)
Physical Therapy Treatment Patient Details  Name: Jimmy Duran MRN: [id number] Date of Birth: 1955-12-28  Today's Date: 07/21/2012 Time: 0802-0838 PT Time Calculation (min): 36 min Visit#: 14 of 16  Re-eval: 07/07/12 Charges:  therex 15', manual 15'  Subjective: Symptoms/Limitations Symptoms: Pt. states the nerve pain is still bad in his knee shooting down his lateral LE from hip to calf.  Reports pain is the same. Pain Assessment Currently in Pain?: Yes Pain Score:   4 Pain Location: Knee Pain Orientation: Left   Exercise/Treatments Aerobic Elliptical: elliptical 6' Prone  Other Prone Exercises: POE 3', press ups 5 reps, opposite arm/leg 5 reps alternating   Manual Therapy Other Manual Therapy: STM/MFR left knee concentrating on medial knee and quadricep to decrease pain, tightness and spams, sciatic nerve glides to LT; MFR to ITB as well.  Physical Therapy Assessment and Plan PT Assessment and Plan Clinical Impression Statement: Pt. c/o pain with POC, discomfort but not pain with KTC.  May benefit from more flexion vs extension.   Continued tightness/adhesions distal quad; able to resolve with MFR.  Also with noted ITB tightness. PT Plan: Resume strengthening activities for return to work as pain allows.  Add ITB stretch in standing next visit; re-eval for MD appointment.     Problem List Patient Active Problem List   Diagnosis Date Noted  . Unspecified constipation 07/02/2012  . Esophageal dysphagia 07/02/2012  . FH: colon cancer 07/02/2012  . Patellar tendinitis of left knee 08/07/2011  . H/O arthroscopy of left knee 07/09/2011  . Medial meniscus, posterior horn derangement 06/17/2011  . OA (osteoarthritis) of knee 06/17/2011  . Headache 11/16/2010  . GERD (gastroesophageal reflux disease) 05/04/2010  . Diabetes 05/04/2010  . COPD (chronic obstructive pulmonary disease) 05/04/2010  . Hemorrhoids 05/04/2010  . MIXED HYPERLIPIDEMIA 04/25/2010  . Tobacco use  disorder 04/25/2010  . ESSENTIAL HYPERTENSION, BENIGN 04/25/2010  . CORONARY ATHEROSCLEROSIS NATIVE CORONARY ARTERY 04/25/2010  . CAROTID ARTERY DISEASE 04/25/2010    PT - End of Session Activity Tolerance: Patient tolerated treatment well General Behavior During Therapy: [facility] for tasks assessed/performed Cognition: WFL for tasks performed   Lurena Nida, PTA/CLT 07/21/2012, 8:54 AM

## 2012-07-23 ENCOUNTER — Ambulatory Visit (INDEPENDENT_AMBULATORY_CARE_PROVIDER_SITE_OTHER): Payer: BC Managed Care – PPO | Admitting: Orthopedic Surgery

## 2012-07-23 ENCOUNTER — Ambulatory Visit (HOSPITAL_COMMUNITY)
Admission: RE | Admit: 2012-07-23 | Discharge: 2012-07-23 | Disposition: A | Discharge status: Home / Self Care | Payer: BC Managed Care – PPO | Source: Ambulatory Visit | Attending: Pulmonary Disease | Admitting: Pulmonary Disease

## 2012-07-23 ENCOUNTER — Encounter: Payer: Self-pay | Admitting: Internal Medicine

## 2012-07-23 ENCOUNTER — Encounter: Payer: Self-pay | Admitting: Orthopedic Surgery

## 2012-07-23 VITALS — BP 118/80 | Ht 67.0 in | Wt 170.0 lb

## 2012-07-23 DIAGNOSIS — M5410 Radiculopathy, site unspecified: Secondary | ICD-10-CM

## 2012-07-23 DIAGNOSIS — Z96659 Presence of unspecified artificial knee joint: Secondary | ICD-10-CM

## 2012-07-23 DIAGNOSIS — Z96652 Presence of left artificial knee joint: Secondary | ICD-10-CM

## 2012-07-23 DIAGNOSIS — IMO0002 Reserved for concepts with insufficient information to code with codable children: Secondary | ICD-10-CM

## 2012-07-23 MED ORDER — GABAPENTIN 100 MG PO CAPS: 200.0000 mg | ORAL_CAPSULE | Freq: Three times a day (TID) | ORAL | Status: DC

## 2012-07-23 MED ORDER — HYDROCODONE-ACETAMINOPHEN 10-325 MG PO TABS: 1.0000 | ORAL_TABLET | Freq: Four times a day (QID) | ORAL | Status: DC | PRN

## 2012-07-23 NOTE — Evaluation (Addendum)
Physical Therapy Re-evaluation/Treatment  Patient Details  Name: Jimmy Duran MRN: [id number] Date of Birth: May 13, 1955  Today's Date: 07/23/2012 Time: 4098-1191 PT Time Calculation (min): 42 min Charge: MMT x 1, ROM Measurement x 1, Manual x 23', Therex 15'              Visit#: 15 of 16  Re-eval:   Assessment Diagnosis: L TKR Surgical Date: 04/30/11 Next MD Visit: Dr. Romeo Apple - 07/23/2012 Subjective Symptoms/Limitations Symptoms: Pt stated knee is getting on his nerves, no real pain currently stated pain gets worse throughout Jimmy day.  Reported increased swelling. How long can you sit comfortably?: Able to sit comfortably for 1 hour How long can you stand comfortably?: Able to stand 3 hours How long can you walk comfortably?: Able to walking a mile, walk for 1 hour Pain Assessment Currently in Pain?: No/denies  Objective:   Sensation/Coordination/Flexibility/Functional Tests Functional Tests Functional Tests: Lower Extremity Functioal Scale (LEFS): 60/80 on 06/09/2012  Assessment LLE AROM (degrees) Left Knee Extension: 0 Left Knee Flexion: 126 LLE Strength Left Hip Flexion: 5/5 (was 4/5) Left Hip ABduction: 5/5 (was 4+/5) Left Knee Flexion: 5/5 (was 4/5)  Exercise/Treatments Stretches Active Hamstring Stretch: 3 reps;30 seconds Gastroc Stretch: 3 reps;30 seconds;Limitations Gastroc Stretch Limitations: slant board Aerobic Elliptical: elliptical 6' Standing Stairs: 2RT with 1 HR Supine Heel Slides: 5 reps Knee Flexion: PROM  Manual Therapy Manual Therapy: Myofascial release Myofascial Release: STM/MFR Lt knee scar tissue massage and MFR on medial knee and quadriceps to decrease pain, tightness and spasms.    Physical Therapy Assessment and Plan PT Assessment and Plan Clinical Impression Statement: Re-eval complete:  Mr Preece has had 15 OPPT sessions over 9 weeks with Jimmy following findings:  Pt is independent with HEP daily and able to demonstrate  appropriate technique with all exercises.  Pt has met 2/3 STGs and 4/5 LTGs.  Improved functional strength with ability to ascend and descend stairs reciprocally without difficulty, able to demonstrate 5 half kneel to stands, able to stand for 3 hours, sit and walk for 1 hour.  Improved AROM 0-126.  Improved gait mechanics to [facility] no antalgic gait noted.  Improved percievied funcitonal mobility.  Pt continues to be limited by pain on proximal incision scar tissue adhesions.   PT Plan: F/U with MD to continue or discharge physical therapy.    Goals Home Exercise Program Pt will Perform Home Exercise Program: Independently PT Goal: Perform Home Exercise Program - Progress: Met (daily) PT Short Term Goals Time to Complete Short Term Goals: 4 weeks PT Short Term Goal 1: Pt will report pain less than 4/10 for 50% of his day for improved QOL.  (6/10 at night time - difficulty sleeping) PT Short Term Goal 1 - Progress: Progressing toward goal (4-5/10 50% of day) PT Short Term Goal 2: Pt will improve his LLE functional strength and tolerate standing for 45 minutes in order to progress towards return to work activities.  (45 minutes and has increased pain) PT Short Term Goal 2 - Progress: Met PT Short Term Goal 3: Pt will improve lt knee AROM 0-110 degrees for greater ease for ascending and descending stairs.  (AROM: 1-120, pain with ascending stairs) PT Short Term Goal 3 - Progress: Met PT Long Term Goals Time to Complete Long Term Goals: 8 weeks PT Long Term Goal 1: Pt will improve LLE functional strength to WNL and demonstrate 5 half kneel to stands independently in order to perform return to work activities.  PT Long Term Goal 1 - Progress: Met PT Long Term Goal 2: Pt will improve his LLE AROM to WNL in order to ascend and descend 10 stairs w/1 handrail with reciprocal pattern for greater ease with entering community dwellings.  PT Long Term Goal 2 - Progress: Met Long Term Goal 3: Pt will report  pain less than 3/10 for 75% of his day for improved QOL.  Long Term Goal 3 Progress: Progressing toward goal Long Term Goal 4: Pt will improve his gait mechanics  to WNL in order to tolerate ambulating independently for 1 hour in order to return to work to decrease risk of secondary impairments.  Long Term Goal 4 Progress: Met PT Long Term Goal 5: Pt will improve his LEFS to 60/80 for improved percived functional mobility.  Long Term Goal 5 Progress: Met (LEFS 60/80 on 06/09/2012)  Problem List Patient Active Problem List   Diagnosis Date Noted  . Unspecified constipation 07/02/2012  . Esophageal dysphagia 07/02/2012  . FH: colon cancer 07/02/2012  . Patellar tendinitis of left knee 08/07/2011  . H/O arthroscopy of left knee 07/09/2011  . Medial meniscus, posterior horn derangement 06/17/2011  . OA (osteoarthritis) of knee 06/17/2011  . Headache 11/16/2010  . GERD (gastroesophageal reflux disease) 05/04/2010  . Diabetes 05/04/2010  . COPD (chronic obstructive pulmonary disease) 05/04/2010  . Hemorrhoids 05/04/2010  . MIXED HYPERLIPIDEMIA 04/25/2010  . Tobacco use disorder 04/25/2010  . ESSENTIAL HYPERTENSION, BENIGN 04/25/2010  . CORONARY ATHEROSCLEROSIS NATIVE CORONARY ARTERY 04/25/2010  . CAROTID ARTERY DISEASE 04/25/2010    PT - End of Session Activity Tolerance: Patient tolerated treatment well General Behavior During Therapy: [facility] for tasks assessed/performed Cognition: WFL for tasks performed  GP    [name] 07/23/2012, 12:51 PM

## 2012-07-23 NOTE — Patient Instructions (Addendum)
Increase gabapentin to 200 mg 3 x a day x 1 week, call in the am of next Thurs.

## 2012-07-23 NOTE — Progress Notes (Signed)
Patient ID: THERMAN HUGHLETT, male   DOB: 16-Dec-1955, 57 y.o.   MRN: [id number] Chief Complaint  Patient presents with  . Follow-up    4 week recheck on left TKA. DOS 04-27-12.    This is actually a 3 month recheck on the left knee status post left total knee the patient has developed some type of neuralgia in his left knee. He's regained 125 of knee flexion his knee is flat and straight but he still has thigh pain and radicular pain starting from his lumbar spine into the left knee  On exam he has no joint effusion his incision is clean dry and intact he has some tenderness around his quadriceps tendon. He has tenderness over the lumbar spine left gluteal area left lateral thigh interestingly he has similar numbness and tingling on the right lateral thigh as well  Recommend increase Neurontin. His sciatic nerve symptoms have slightly improved  He can stop physical therapy he'll call me next week to see if we need to increase the Neurontin again  He scheduled to go back to work on June 23  He is a two-week appointment here regardless of the phone call

## 2012-07-24 ENCOUNTER — Telehealth (HOSPITAL_COMMUNITY): Payer: Self-pay

## 2012-07-28 ENCOUNTER — Ambulatory Visit (HOSPITAL_COMMUNITY): Payer: BC Managed Care – PPO

## 2012-07-30 ENCOUNTER — Telehealth: Payer: Self-pay | Admitting: Orthopedic Surgery

## 2012-07-30 ENCOUNTER — Ambulatory Visit (HOSPITAL_COMMUNITY): Payer: BC Managed Care – PPO | Admitting: Physical Therapy

## 2012-07-30 NOTE — Telephone Encounter (Signed)
Routing to Dr Harrison 

## 2012-07-30 NOTE — Telephone Encounter (Signed)
Patient called to relay, as advised at appointment 07/23/12) that he is still not noticing much improvement with present dosage of Gabapentin (gabapentin (NEURONTIN) 100 MG capsule); states he still has "nerve pain" and pain shooting downward to knee.  His pharmacy is [facility], [place], [place]; next appointment is scheduled 08/11/12.  Please advise. Patient ph# [phone].

## 2012-07-30 NOTE — Telephone Encounter (Signed)
Increase dose to 200mg  every 8 hours

## 2012-07-31 NOTE — Telephone Encounter (Signed)
Tried to reach patient 07/30/12, left message, and 07/31/12, there was no answer.

## 2012-08-03 NOTE — Telephone Encounter (Signed)
Patient informed of Dr. Harrison's reply. 

## 2012-08-04 ENCOUNTER — Ambulatory Visit (HOSPITAL_COMMUNITY): Payer: BC Managed Care – PPO

## 2012-08-06 ENCOUNTER — Ambulatory Visit (HOSPITAL_COMMUNITY): Payer: BC Managed Care – PPO | Admitting: Physical Therapy

## 2012-08-10 ENCOUNTER — Encounter: Payer: Self-pay | Admitting: Orthopedic Surgery

## 2012-08-11 ENCOUNTER — Ambulatory Visit (HOSPITAL_COMMUNITY): Payer: BC Managed Care – PPO | Admitting: Physical Therapy

## 2012-08-11 ENCOUNTER — Encounter: Payer: Self-pay | Admitting: Orthopedic Surgery

## 2012-08-11 ENCOUNTER — Ambulatory Visit (INDEPENDENT_AMBULATORY_CARE_PROVIDER_SITE_OTHER): Payer: BC Managed Care – PPO | Admitting: Orthopedic Surgery

## 2012-08-11 VITALS — BP 112/80 | Ht 67.0 in | Wt 170.0 lb

## 2012-08-11 DIAGNOSIS — M5410 Radiculopathy, site unspecified: Secondary | ICD-10-CM

## 2012-08-11 DIAGNOSIS — IMO0002 Reserved for concepts with insufficient information to code with codable children: Secondary | ICD-10-CM

## 2012-08-11 NOTE — Progress Notes (Signed)
Patient ID: Jimmy Duran, male   DOB: [birth year], 57 y.o.   MRN: [id number] Chief Complaint  Patient presents with  . Follow-up     2 week recheck left leg neuralgia response to medicine    Status post left total knee as well. 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 On Neurontin 300 mg 3 times a day with mild improvement  His pain occurs primarily when he rolls onto his left side at night. He can sit up and stand and walk and his pain is relieved Review of systems no new findings continues to report excellent return to function and pain relief in the left knee.vs General appearance is normal, the patient is alert and oriented x3 with normal mood and affect. Ambulation no assistive devices, no evidence of any limping Neuro and vascular exam exam shows reproducible tension sign left leg Knee range of motion 0 to 120  Encounter Diagnosis  Name Primary?  . Radiculopathy Yes   status post left total knee    Scheduled for MRI in preparation for nerve or facet block or epidural Continue gabapentin

## 2012-08-11 NOTE — Patient Instructions (Addendum)
Mri l -spine

## 2012-08-12 ENCOUNTER — Telehealth: Payer: Self-pay | Admitting: Radiology

## 2012-08-12 NOTE — Telephone Encounter (Signed)
Patient has MRI at [facility] on 08-17-12 at 3:45. Patient has [employer], no precert is needed per recording on 08-12-12. Dr. Romeo Apple will call the patient with his results.

## 2012-08-13 ENCOUNTER — Ambulatory Visit (HOSPITAL_COMMUNITY): Payer: BC Managed Care – PPO | Admitting: Physical Therapy

## 2012-08-17 ENCOUNTER — Ambulatory Visit (HOSPITAL_COMMUNITY)
Admission: RE | Admit: 2012-08-17 | Discharge: 2012-08-17 | Disposition: A | Discharge status: Home / Self Care | Payer: BC Managed Care – PPO | Source: Ambulatory Visit | Attending: Orthopedic Surgery | Admitting: Orthopedic Surgery

## 2012-08-17 DIAGNOSIS — M545 Low back pain, unspecified: Secondary | ICD-10-CM | POA: Insufficient documentation

## 2012-08-17 DIAGNOSIS — M5137 Other intervertebral disc degeneration, lumbosacral region: Secondary | ICD-10-CM | POA: Insufficient documentation

## 2012-08-17 DIAGNOSIS — M51379 Other intervertebral disc degeneration, lumbosacral region without mention of lumbar back pain or lower extremity pain: Secondary | ICD-10-CM | POA: Insufficient documentation

## 2012-08-17 DIAGNOSIS — M5410 Radiculopathy, site unspecified: Secondary | ICD-10-CM

## 2012-08-17 DIAGNOSIS — M5126 Other intervertebral disc displacement, lumbar region: Secondary | ICD-10-CM | POA: Insufficient documentation

## 2012-08-18 ENCOUNTER — Ambulatory Visit (HOSPITAL_COMMUNITY): Payer: BC Managed Care – PPO

## 2012-08-19 ENCOUNTER — Telehealth: Payer: Self-pay | Admitting: Orthopedic Surgery

## 2012-08-19 NOTE — Telephone Encounter (Signed)
Patient came by office, asking if Dr. Romeo Apple has MRI results (done at [facility]).  No follow-up appointment is scheduled at this time, pending results and call to patient.  His phone #'s are: (home): [phone], or  (cell): [phone].  Please advise.

## 2012-08-20 ENCOUNTER — Ambulatory Visit (HOSPITAL_COMMUNITY): Payer: BC Managed Care – PPO | Admitting: Physical Therapy

## 2012-08-22 ENCOUNTER — Telehealth: Payer: Self-pay | Admitting: Orthopedic Surgery

## 2012-08-23 NOTE — Telephone Encounter (Signed)
ERROR

## 2012-09-02 ENCOUNTER — Telehealth: Payer: Self-pay | Admitting: Radiology

## 2012-09-02 DIAGNOSIS — IMO0002 Reserved for concepts with insufficient information to code with codable children: Secondary | ICD-10-CM

## 2012-09-02 NOTE — Telephone Encounter (Signed)
Disc protrusion as expected   Ask him if he wants esi

## 2012-09-02 NOTE — Telephone Encounter (Addendum)
Patient informed of MRI results. Sent order for ESI injections to [facility].

## 2012-09-09 ENCOUNTER — Other Ambulatory Visit: Payer: Self-pay | Admitting: *Deleted

## 2012-09-09 ENCOUNTER — Telehealth: Payer: Self-pay | Admitting: Orthopedic Surgery

## 2012-09-09 DIAGNOSIS — M5010 Cervical disc disorder with radiculopathy, unspecified cervical region: Secondary | ICD-10-CM

## 2012-09-09 MED ORDER — HYDROCODONE-ACETAMINOPHEN 5-325 MG PO TABS: 1.0000 | ORAL_TABLET | Freq: Four times a day (QID) | ORAL | Status: DC | PRN

## 2012-09-09 NOTE — Telephone Encounter (Signed)
I re-faxed order for ESI injections

## 2012-09-09 NOTE — Telephone Encounter (Signed)
Patient called, per voice message last evening 09/08/12) to request medication for pain, states now that he is back to work and walking on concrete floors.  Also said, is something he can take, at least until he hears from [facility] on the [facility]?  His ph#'s are  [phone] [facility]) [phone] (Work)

## 2012-09-09 NOTE — Telephone Encounter (Signed)
Sent prescription for Norco to [facility] [id number]

## 2012-09-09 NOTE — Telephone Encounter (Signed)
Please review his record I remember that he was to get epidural steroid injections  He can put him on Naprosyn 500 mg twice a day and Norco 5 mg every 4 when necessary pain and gabapentin 103 tablets at night

## 2012-09-16 ENCOUNTER — Ambulatory Visit
Admission: RE | Admit: 2012-09-16 | Discharge: 2012-09-16 | Disposition: A | Discharge status: Home / Self Care | Payer: BC Managed Care – PPO | Source: Ambulatory Visit | Attending: Orthopedic Surgery | Admitting: Orthopedic Surgery

## 2012-09-16 DIAGNOSIS — IMO0002 Reserved for concepts with insufficient information to code with codable children: Secondary | ICD-10-CM

## 2012-09-16 MED ORDER — IOHEXOL 180 MG/ML  SOLN
1.0000 mL | Freq: Once | INTRAMUSCULAR | Status: AC | PRN
  Administered 2012-09-16: 1 mL via EPIDURAL

## 2012-09-16 MED ORDER — METHYLPREDNISOLONE ACETATE 40 MG/ML INJ SUSP (RADIOLOG
120.0000 mg | Freq: Once | INTRAMUSCULAR | Status: AC
  Administered 2012-09-16: 120 mg via EPIDURAL

## 2012-10-02 ENCOUNTER — Other Ambulatory Visit: Payer: Self-pay | Admitting: *Deleted

## 2012-10-02 DIAGNOSIS — IMO0002 Reserved for concepts with insufficient information to code with codable children: Secondary | ICD-10-CM

## 2012-10-06 ENCOUNTER — Ambulatory Visit
Admission: RE | Admit: 2012-10-06 | Discharge: 2012-10-06 | Disposition: A | Discharge status: Home / Self Care | Payer: BC Managed Care – PPO | Source: Ambulatory Visit | Attending: Orthopedic Surgery | Admitting: Orthopedic Surgery

## 2012-10-06 DIAGNOSIS — IMO0002 Reserved for concepts with insufficient information to code with codable children: Secondary | ICD-10-CM

## 2012-10-06 MED ORDER — IOHEXOL 180 MG/ML  SOLN
1.0000 mL | Freq: Once | INTRAMUSCULAR | Status: AC | PRN
  Administered 2012-10-06: 1 mL via EPIDURAL

## 2012-10-06 MED ORDER — METHYLPREDNISOLONE ACETATE 40 MG/ML INJ SUSP (RADIOLOG
120.0000 mg | Freq: Once | INTRAMUSCULAR | Status: AC
  Administered 2012-10-06: 120 mg via EPIDURAL

## 2012-10-28 ENCOUNTER — Other Ambulatory Visit: Payer: Self-pay | Admitting: *Deleted

## 2012-10-28 ENCOUNTER — Telehealth: Payer: Self-pay | Admitting: Orthopedic Surgery

## 2012-10-28 DIAGNOSIS — M48061 Spinal stenosis, lumbar region without neurogenic claudication: Secondary | ICD-10-CM

## 2012-10-28 NOTE — Telephone Encounter (Signed)
Faxed order to [facility] for third and last injection.

## 2012-11-06 ENCOUNTER — Ambulatory Visit
Admission: RE | Admit: 2012-11-06 | Discharge: 2012-11-06 | Disposition: A | Discharge status: Home / Self Care | Payer: BC Managed Care – PPO | Source: Ambulatory Visit | Attending: Orthopedic Surgery | Admitting: Orthopedic Surgery

## 2012-11-06 DIAGNOSIS — M48061 Spinal stenosis, lumbar region without neurogenic claudication: Secondary | ICD-10-CM

## 2012-11-06 MED ORDER — IOHEXOL 180 MG/ML  SOLN
1.0000 mL | Freq: Once | INTRAMUSCULAR | Status: AC | PRN
  Administered 2012-11-06: 1 mL via EPIDURAL

## 2012-11-06 MED ORDER — METHYLPREDNISOLONE ACETATE 40 MG/ML INJ SUSP (RADIOLOG
120.0000 mg | Freq: Once | INTRAMUSCULAR | Status: AC
  Administered 2012-11-06: 120 mg via EPIDURAL

## 2012-12-11 ENCOUNTER — Telehealth: Payer: Self-pay | Admitting: Cardiology

## 2012-12-11 MED ORDER — LISINOPRIL-HYDROCHLOROTHIAZIDE 20-12.5 MG PO TABS: 1.0000 | ORAL_TABLET | Freq: Two times a day (BID) | ORAL | Status: DC

## 2012-12-11 NOTE — Telephone Encounter (Signed)
Medication sent via escribe. Spoke to patient concerning lab/test results/instructions from provider. Patient understood.    

## 2012-12-11 NOTE — Telephone Encounter (Signed)
Needs refill on Lisinopril sent to [facility] in [place] / tgs

## 2013-04-07 IMAGING — CR DG KNEE 1-2V PORT*L*
2 series · 2 of 2 positions shown · non-contrast
Comparison: 12/18/2011

CLINICAL DATA: Left knee arthroplasty.

PORTABLE LEFT KNEE - 1-2 VIEW

[view not recorded (1 of 2)]
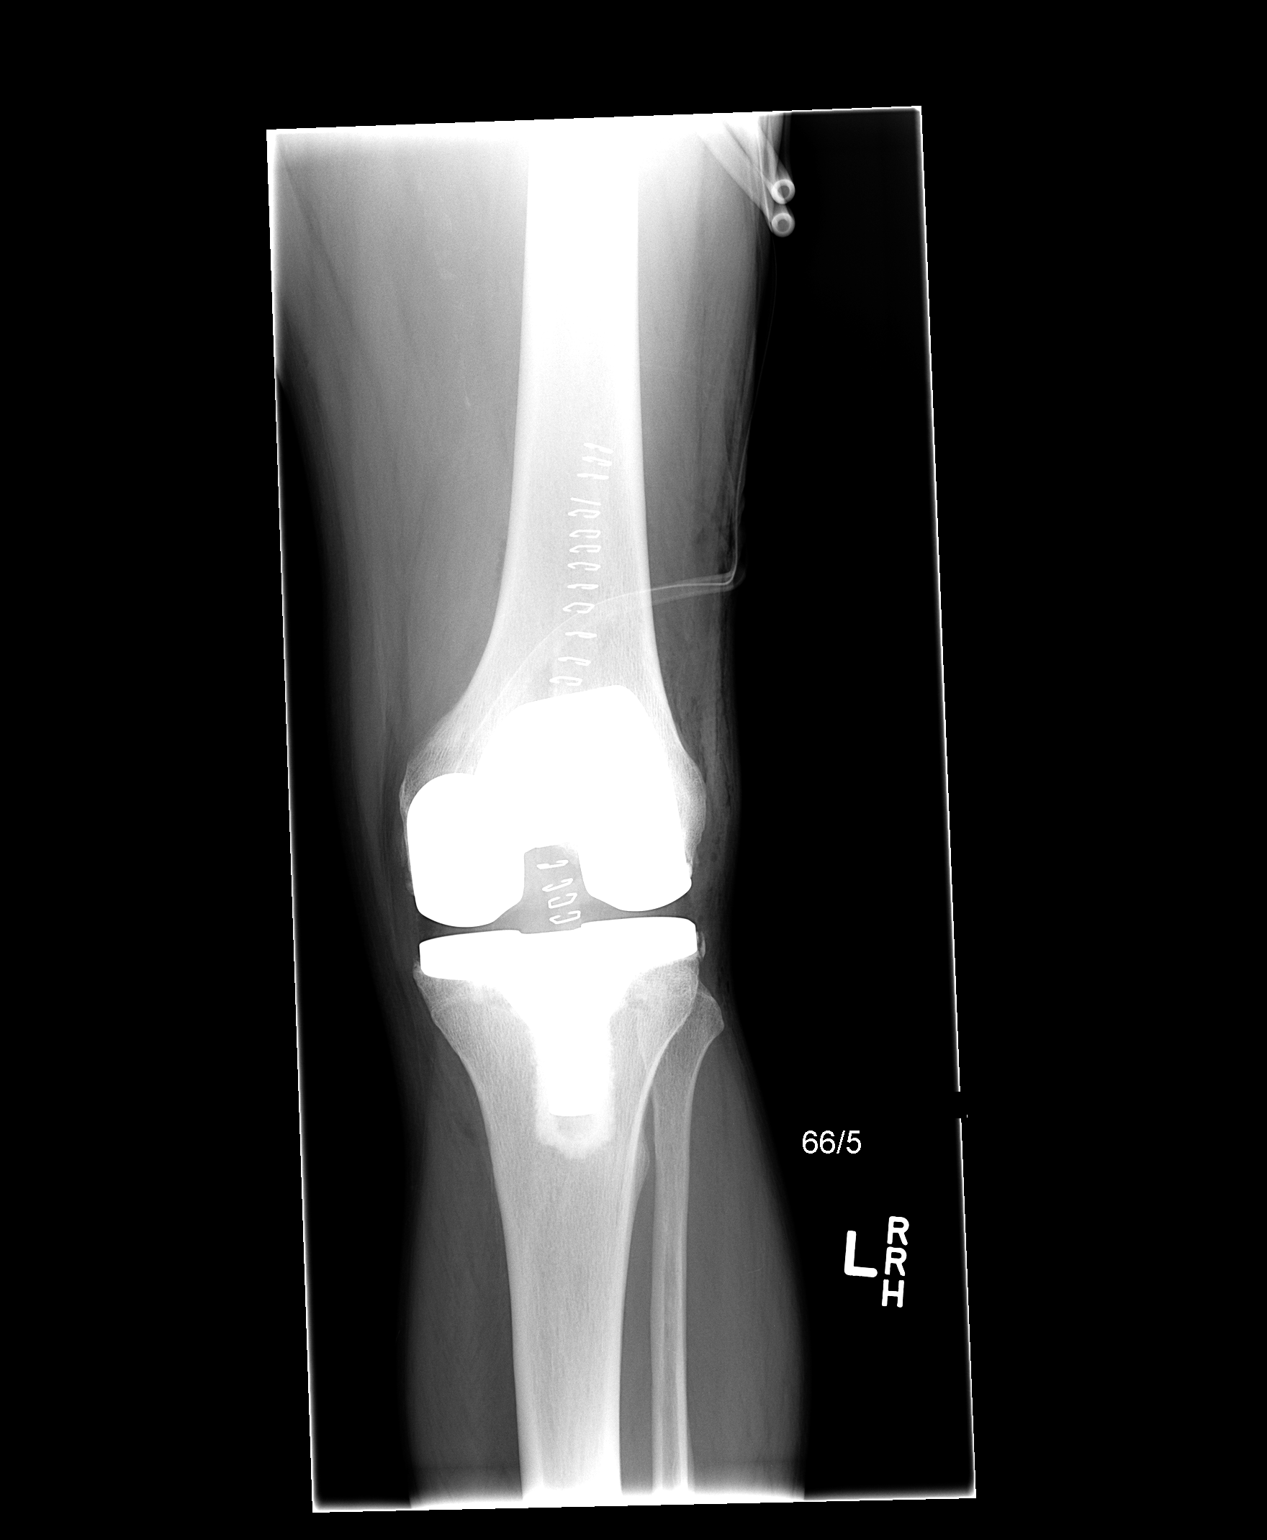

[view not recorded (2 of 2)]
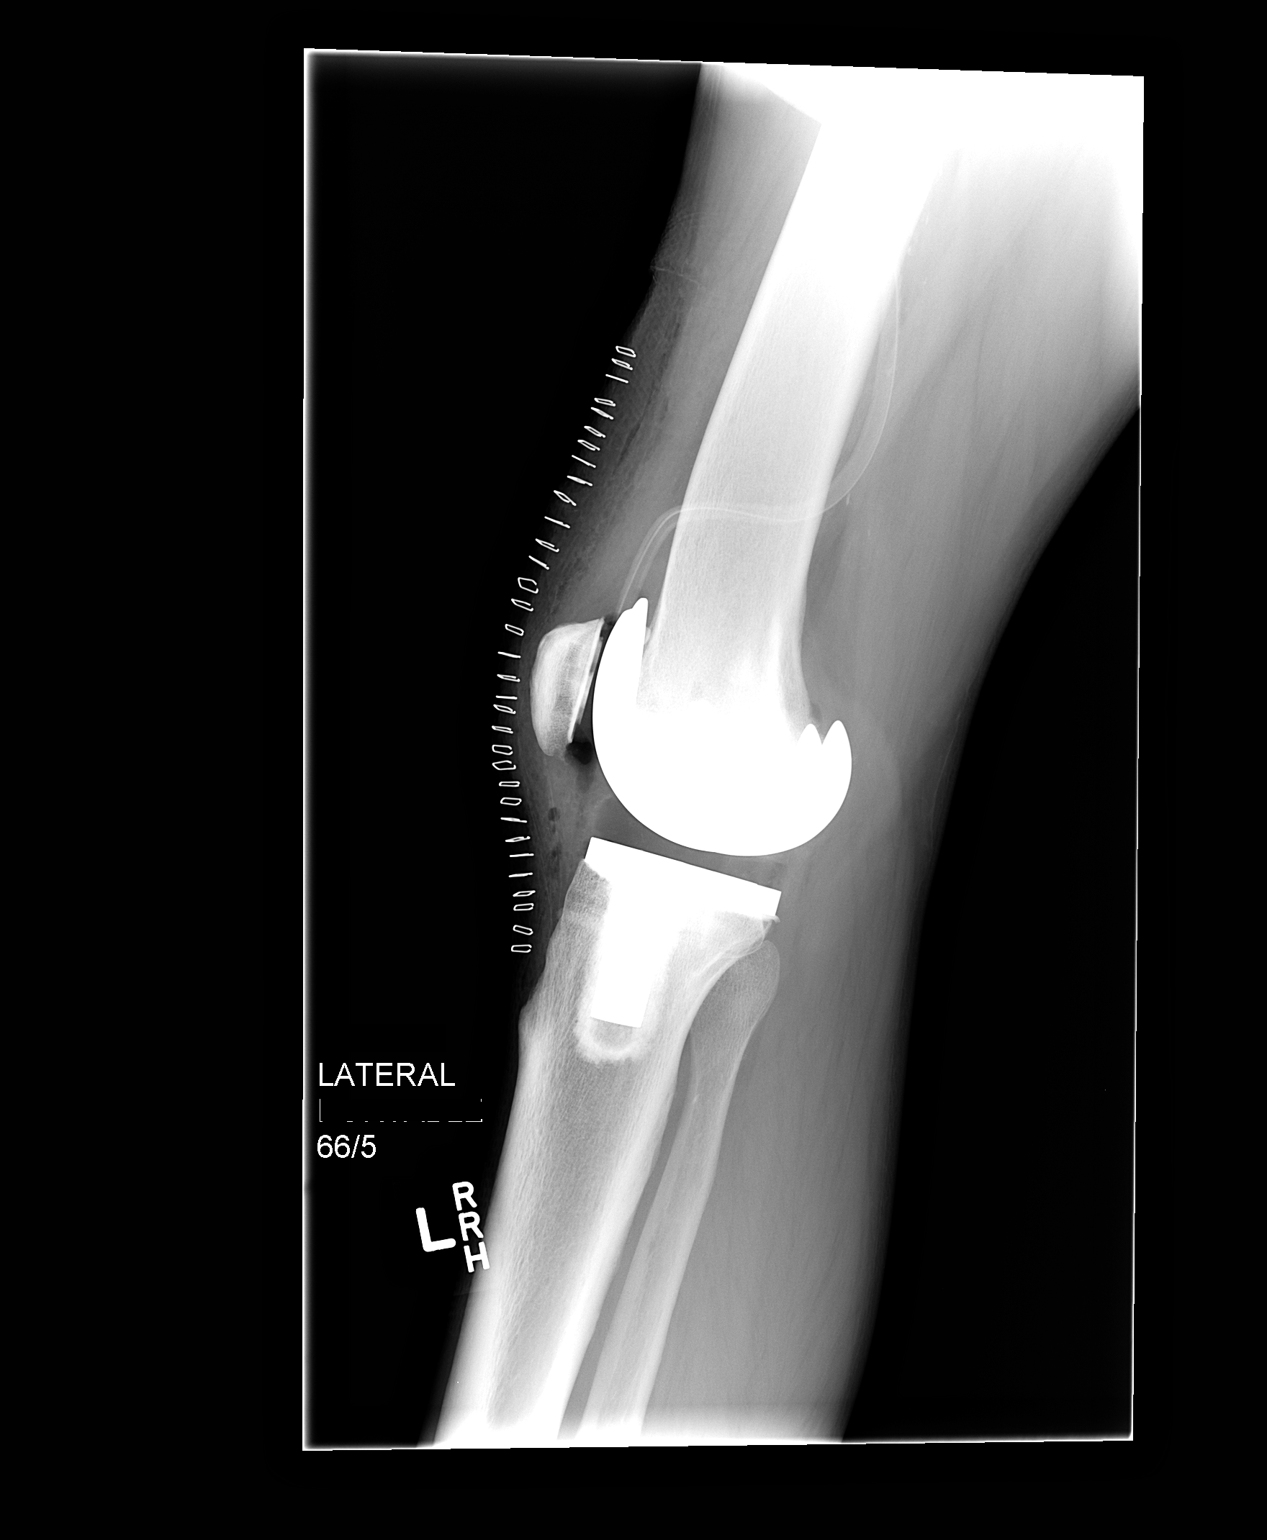

[2 of 2 positions shown; findings below may reference images not displayed]

FINDINGS: Examination demonstrates evidence of patient's recent
total knee arthroplasty with prosthetic components intact and
normally located.  A surgical drain is present over the superior
knee joint with minimal air fluid in the knee joint.  Skin staples
present vertically over the anterior soft tissues in the midline.
IMPRESSION: Post surgical change compatible with recent total knee
arthroplasty.  No complicating features.

## 2013-04-14 ENCOUNTER — Encounter (HOSPITAL_COMMUNITY): Payer: Self-pay | Admitting: Emergency Medicine

## 2013-04-14 ENCOUNTER — Emergency Department (HOSPITAL_COMMUNITY)
Admission: EM | Admit: 2013-04-14 | Discharge: 2013-04-14 | Disposition: A | Discharge status: Home / Self Care | Payer: BC Managed Care – PPO | Attending: Emergency Medicine | Admitting: Emergency Medicine

## 2013-04-14 DIAGNOSIS — I2510 Atherosclerotic heart disease of native coronary artery without angina pectoris: Secondary | ICD-10-CM | POA: Insufficient documentation

## 2013-04-14 DIAGNOSIS — R079 Chest pain, unspecified: Secondary | ICD-10-CM | POA: Insufficient documentation

## 2013-04-14 DIAGNOSIS — Z860100 Personal history of colon polyps, unspecified: Secondary | ICD-10-CM | POA: Insufficient documentation

## 2013-04-14 DIAGNOSIS — Z8673 Personal history of transient ischemic attack (TIA), and cerebral infarction without residual deficits: Secondary | ICD-10-CM | POA: Insufficient documentation

## 2013-04-14 DIAGNOSIS — M2550 Pain in unspecified joint: Secondary | ICD-10-CM | POA: Insufficient documentation

## 2013-04-14 DIAGNOSIS — Z8739 Personal history of other diseases of the musculoskeletal system and connective tissue: Secondary | ICD-10-CM | POA: Insufficient documentation

## 2013-04-14 DIAGNOSIS — E782 Mixed hyperlipidemia: Secondary | ICD-10-CM | POA: Insufficient documentation

## 2013-04-14 DIAGNOSIS — Z79899 Other long term (current) drug therapy: Secondary | ICD-10-CM | POA: Insufficient documentation

## 2013-04-14 DIAGNOSIS — J441 Chronic obstructive pulmonary disease with (acute) exacerbation: Secondary | ICD-10-CM | POA: Insufficient documentation

## 2013-04-14 DIAGNOSIS — I10 Essential (primary) hypertension: Secondary | ICD-10-CM | POA: Insufficient documentation

## 2013-04-14 DIAGNOSIS — R112 Nausea with vomiting, unspecified: Secondary | ICD-10-CM

## 2013-04-14 DIAGNOSIS — Z951 Presence of aortocoronary bypass graft: Secondary | ICD-10-CM | POA: Insufficient documentation

## 2013-04-14 DIAGNOSIS — E119 Type 2 diabetes mellitus without complications: Secondary | ICD-10-CM | POA: Insufficient documentation

## 2013-04-14 DIAGNOSIS — K219 Gastro-esophageal reflux disease without esophagitis: Secondary | ICD-10-CM | POA: Insufficient documentation

## 2013-04-14 DIAGNOSIS — F17200 Nicotine dependence, unspecified, uncomplicated: Secondary | ICD-10-CM | POA: Insufficient documentation

## 2013-04-14 DIAGNOSIS — Z7982 Long term (current) use of aspirin: Secondary | ICD-10-CM | POA: Insufficient documentation

## 2013-04-14 DIAGNOSIS — R197 Diarrhea, unspecified: Secondary | ICD-10-CM | POA: Insufficient documentation

## 2013-04-14 LAB — BASIC METABOLIC PANEL
BUN: 14 mg/dL (ref 6–23)
CO2: 24 mEq/L (ref 19–32)
Calcium: 9.1 mg/dL (ref 8.4–10.5)
Chloride: 101 mEq/L (ref 96–112)
Creatinine, Ser: 0.83 mg/dL (ref 0.50–1.35)
GFR calc Af Amer: >90 mL/min (ref >90)
GFR calc non Af Amer: >90 mL/min (ref >90)
Glucose, Bld: 217 mg/dL — ABNORMAL HIGH (ref 70–99)
Potassium: 3.8 mEq/L (ref 3.7–5.3)
Sodium: 139 mEq/L (ref 137–147)

## 2013-04-14 LAB — CBC WITH DIFFERENTIAL/PLATELET
Basophils Absolute: 0.1 10*3/uL (ref 0.0–0.1)
Basophils Relative: 1 % (ref 0–1)
Eosinophils Absolute: 0.4 10*3/uL (ref 0.0–0.7)
Eosinophils Relative: 6 % — ABNORMAL HIGH (ref 0–5)
HCT: 44.5 % (ref 39.0–52.0)
Hemoglobin: 15.2 g/dL (ref 13.0–17.0)
Lymphocytes Relative: 25 % (ref 12–46)
Lymphs Abs: 1.8 10*3/uL (ref 0.7–4.0)
MCH: 32.8 pg (ref 26.0–34.0)
MCHC: 34.2 g/dL (ref 30.0–36.0)
MCV: 96.1 fL (ref 78.0–100.0)
Monocytes Absolute: 0.6 10*3/uL (ref 0.1–1.0)
Monocytes Relative: 8 % (ref 3–12)
Neutro Abs: 4.2 10*3/uL (ref 1.7–7.7)
Neutrophils Relative %: 60 % (ref 43–77)
Platelets: 273 10*3/uL (ref 150–400)
RBC: 4.63 MIL/uL (ref 4.22–5.81)
RDW: 12.5 % (ref 11.5–15.5)
WBC: 7.1 10*3/uL (ref 4.0–10.5)

## 2013-04-14 MED ORDER — ONDANSETRON HCL 4 MG PO TABS: 4.0000 mg | ORAL_TABLET | Freq: Four times a day (QID) | ORAL | Status: DC

## 2013-04-14 MED ORDER — SODIUM CHLORIDE 0.9 % IV BOLUS (SEPSIS)
1000.0000 mL | Freq: Once | INTRAVENOUS | Status: AC
  Administered 2013-04-14: 1000 mL via INTRAVENOUS

## 2013-04-14 MED ORDER — PB-HYOSCY-ATROPINE-SCOPOLAMINE 16.2 MG/5ML PO ELIX
10.0000 mL | ORAL_SOLUTION | Freq: Once | ORAL | Status: AC
  Administered 2013-04-14: 32.4 mg via ORAL
  Filled 2013-04-14: qty 2

## 2013-04-14 MED ORDER — ONDANSETRON HCL 4 MG PO TABS
4.0000 mg | ORAL_TABLET | Freq: Once | ORAL | Status: AC
  Administered 2013-04-14: 4 mg via ORAL
  Filled 2013-04-14: qty 1

## 2013-04-14 NOTE — Discharge Instructions (Signed)
Your labs and vital signs are within normal limits. Please use Zofran for nausea/vomiting. Please increase fluids. Use clear liquids until you're able to gradually advance her diet. Please wash hands frequently. This may be one of the contagious viruses that his been going around. Nausea and Vomiting Nausea means you feel sick to your stomach. Throwing up (vomiting) is a reflex where stomach contents come out of your mouth. HOME CARE   Take medicine as told by your doctor.  Do not force yourself to eat. However, you do need to drink fluids.  If you feel like eating, eat a normal diet as told by your doctor.  Eat rice, wheat, potatoes, bread, lean meats, yogurt, fruits, and vegetables.  Avoid high-fat foods.  Drink enough fluids to keep your pee (urine) clear or pale yellow.  Ask your doctor how to replace body fluid losses (rehydrate). Signs of body fluid loss (dehydration) include:  Feeling very thirsty.  Dry lips and mouth.  Feeling dizzy.  Dark pee.  Peeing less than normal.  Feeling confused.  Fast breathing or heart rate. GET HELP RIGHT AWAY IF:   You have blood in your throw up.  You have black or bloody poop (stool).  You have a bad headache or stiff neck.  You feel confused.  You have bad belly (abdominal) pain.  You have chest pain or trouble breathing.  You do not pee at least once every 8 hours.  You have cold, clammy skin.  You keep throwing up after 24 to 48 hours.  You have a fever. MAKE SURE YOU:   Understand these instructions.  Will watch your condition.  Will get help right away if you are not doing well or get worse. Document Released: 08/07/2007 Document Revised: 05/13/2011 Document Reviewed: 07/20/2010 [facility] Patient Information 2014 [place], [place].

## 2013-04-14 NOTE — ED Provider Notes (Signed)
CSN: [id number]     Arrival date & time 04/14/13  1009 History   First MD Initiated Contact with Patient 04/14/13 1016     Chief Complaint  Patient presents with  . Emesis     (Consider location/radiation/quality/duration/timing/severity/associated sxs/prior Treatment) Patient is a 58 y.o. male presenting with vomiting. The history is provided by the patient.  Emesis Severity:  Moderate Duration:  1 week Timing:  Intermittent Number of daily episodes:  10 Quality:  Stomach contents Progression:  Worsening Chronicity:  New Recent urination:  Normal Relieved by:  Nothing Worsened by:  Nothing tried Associated symptoms: arthralgias and diarrhea   Associated symptoms: no abdominal pain   Risk factors: alcohol use   Risk factors: no diabetes, no sick contacts, no suspect food intake and no travel to endemic areas   Risk factors comment:  Uses bc powders.   Past Medical History  Diagnosis Date  . Coronary artery disease     Nonobstructive  . Type 2 diabetes mellitus   . Mixed hyperlipidemia   . Essential hypertension, benign   . Carotid artery disease   . COPD (chronic obstructive pulmonary disease)   . GERD (gastroesophageal reflux disease)   . Hemorrhoids   . Diabetes mellitus   . Stroke     "ministrokes"  . Arthritis   . Colon polyp    Past Surgical History  Procedure Laterality Date  . Appendectomy  1970  . Cholecystectomy  1993  . Umbilical hernia repair  1993  . Breast surgery      Cyst resection on the right  . Knee surgery      Left knee arthroscopy torn medial meniscus grade 4 chondral changes medial femoral condyle tibial plateau  . Total knee arthroplasty Left 04/27/2012    Procedure: TOTAL KNEE ARTHROPLASTY;  Surgeon: Carole Civil, MD;  Location: [facility];  Service: Orthopedics;  Laterality: Left;  . Colonoscopy  02/12/2007    GMW:[id number] friable anal canal hemorrhoids, otherwise normal rectum and colon  . Colonoscopy N/A 07/16/2012    Procedure:  COLONOSCOPY;  Surgeon: [name], MD;  Location: [facility];  Service: Endoscopy;  Laterality: N/A;  1:15-moved to [place] to notify pt  . Esophagogastroduodenoscopy (egd) with esophageal dilation N/A 07/16/2012    Procedure: ESOPHAGOGASTRODUODENOSCOPY (EGD) WITH ESOPHAGEAL DILATION;  Surgeon: [name], MD;  Location: [facility];  Service: Endoscopy;  Laterality: N/A;   Family History  Problem Relation Age of Onset  . Coronary artery disease Father     CABG in his 70's  . Colon cancer Other     maternal great aunt  . Colon cancer Other     paternal great aunt  . Colon polyps Mother    History  Substance Use Topics  . Smoking status: Current Every Day Smoker -- 0.50 packs/day for 30 years    Types: Cigarettes  . Smokeless tobacco: Current User    Types: Chew  . Alcohol Use: Yes     Comment: Socially    Review of Systems  Constitutional: Negative for activity change.       All ROS Neg except as noted in HPI  HENT: Negative for nosebleeds.   Eyes: Negative for photophobia and discharge.  Respiratory: Positive for cough. Negative for shortness of breath and wheezing.   Cardiovascular: Positive for chest pain. Negative for palpitations.  Gastrointestinal: Positive for vomiting and diarrhea. Negative for abdominal pain and blood in stool.  Genitourinary: Negative for dysuria, frequency and  hematuria.  Musculoskeletal: Positive for arthralgias. Negative for back pain and neck pain.  Skin: Negative.   Neurological: Negative for dizziness, seizures and speech difficulty.  Psychiatric/Behavioral: Negative for hallucinations and confusion.      Allergies  Review of patient's allergies indicates no known allergies.  Home Medications   Current Outpatient Rx  Name  Route  Sig  Dispense  Refill  . aspirin EC 325 MG EC tablet   Oral   Take 1 tablet (325 mg total) by mouth daily.         . diphenhydrAMINE (BENADRYL) 25 MG tablet   Oral   Take 25 mg by  mouth daily as needed for allergies.          . fish oil-omega-3 fatty acids 1000 MG capsule   Oral   Take 2 g by mouth 2 (two) times daily.          Marland Kitchen gabapentin (NEURONTIN) 100 MG capsule   Oral   Take 2 capsules (200 mg total) by mouth 3 (three) times daily.   90 capsule   2   . lisinopril-hydrochlorothiazide (PRINZIDE,ZESTORETIC) 20-12.5 MG per tablet   Oral   Take 1 tablet by mouth 2 (two) times daily.   60 tablet   11   . pantoprazole (PROTONIX) 40 MG tablet   Oral   Take 1 tablet (40 mg total) by mouth daily.   30 tablet   11   . simvastatin (ZOCOR) 40 MG tablet   Oral   Take 40 mg by mouth daily.           BP 135/91  Pulse 100  Temp(Src) 98 F (36.7 C)  Resp 16  Ht 5\' 7"  (1.702 m)  Wt 170 lb (77.111 kg)  BMI 26.62 kg/m2  SpO2 97% Physical Exam  Nursing note and vitals reviewed. Constitutional: He is oriented to person, place, and time. He appears well-developed and well-nourished.  Non-toxic appearance.  HENT:  Head: Normocephalic.  Right Ear: Tympanic membrane and external ear normal.  Left Ear: Tympanic membrane and external ear normal.  Eyes: EOM and lids are normal. Pupils are equal, round, and reactive to light.  Neck: Normal range of motion. Neck supple. Carotid bruit is not present.  Cardiovascular: Normal rate, regular rhythm, normal heart sounds, intact distal pulses and normal pulses.   Pulmonary/Chest: No respiratory distress. He has wheezes. He has rhonchi.  Abdominal: Soft. Bowel sounds are normal. He exhibits no ascites and no mass. There is no splenomegaly or hepatomegaly. There is no tenderness. There is no rigidity and no guarding.  Musculoskeletal: Normal range of motion.  Lymphadenopathy:       Head (right side): No submandibular adenopathy present.       Head (left side): No submandibular adenopathy present.    He has no cervical adenopathy.  Neurological: He is alert and oriented to person, place, and time. He has normal  strength. No cranial nerve deficit or sensory deficit.  Skin: Skin is warm and dry.  Psychiatric: He has a normal mood and affect. His speech is normal.    ED Course  Procedures (including critical care time) Labs Review Labs Reviewed - No data to display Imaging Review No results found.  EKG Interpretation   None       MDM   Final diagnoses:  None    *I have reviewed nursing notes, vital signs, and all appropriate lab and imaging results for this patient.** Cbc wnl. bmet wnl except for glucose  elevated 217. Pt tolerated IV fluids without problem. No vomiting noted after IV zofran given. Rx for zofran given. Pt will return if any changes  Or problem.   Lenox Ahr, PA-C 04/15/13 1912

## 2013-04-14 NOTE — ED Notes (Signed)
Pt c/o n/v/d and mid back pain since Thursday of last week.

## 2013-04-19 NOTE — ED Provider Notes (Signed)
Medical screening examination/treatment/procedure(s) were performed by non-physician practitioner and as supervising physician I was immediately available for consultation/collaboration.  Richarda Blade, MD 04/19/13 [phone]

## 2013-04-22 ENCOUNTER — Observation Stay (HOSPITAL_COMMUNITY)
Admission: EM | Admit: 2013-04-22 | Discharge: 2013-04-23 | Disposition: A | Discharge status: Home / Self Care | Payer: BC Managed Care – PPO | Attending: Family Medicine | Admitting: Family Medicine

## 2013-04-22 ENCOUNTER — Emergency Department (HOSPITAL_COMMUNITY): Payer: BC Managed Care – PPO

## 2013-04-22 ENCOUNTER — Encounter (HOSPITAL_COMMUNITY): Payer: Self-pay | Admitting: Emergency Medicine

## 2013-04-22 DIAGNOSIS — J4489 Other specified chronic obstructive pulmonary disease: Secondary | ICD-10-CM | POA: Insufficient documentation

## 2013-04-22 DIAGNOSIS — E119 Type 2 diabetes mellitus without complications: Secondary | ICD-10-CM | POA: Insufficient documentation

## 2013-04-22 DIAGNOSIS — I2510 Atherosclerotic heart disease of native coronary artery without angina pectoris: Secondary | ICD-10-CM | POA: Diagnosis present

## 2013-04-22 DIAGNOSIS — Z23 Encounter for immunization: Secondary | ICD-10-CM | POA: Insufficient documentation

## 2013-04-22 DIAGNOSIS — Z794 Long term (current) use of insulin: Secondary | ICD-10-CM | POA: Insufficient documentation

## 2013-04-22 DIAGNOSIS — I779 Disorder of arteries and arterioles, unspecified: Secondary | ICD-10-CM | POA: Insufficient documentation

## 2013-04-22 DIAGNOSIS — I6529 Occlusion and stenosis of unspecified carotid artery: Secondary | ICD-10-CM | POA: Diagnosis present

## 2013-04-22 DIAGNOSIS — I10 Essential (primary) hypertension: Secondary | ICD-10-CM | POA: Insufficient documentation

## 2013-04-22 DIAGNOSIS — F17200 Nicotine dependence, unspecified, uncomplicated: Secondary | ICD-10-CM | POA: Insufficient documentation

## 2013-04-22 DIAGNOSIS — E1159 Type 2 diabetes mellitus with other circulatory complications: Secondary | ICD-10-CM | POA: Diagnosis present

## 2013-04-22 DIAGNOSIS — J449 Chronic obstructive pulmonary disease, unspecified: Secondary | ICD-10-CM | POA: Diagnosis present

## 2013-04-22 DIAGNOSIS — K219 Gastro-esophageal reflux disease without esophagitis: Secondary | ICD-10-CM | POA: Diagnosis present

## 2013-04-22 DIAGNOSIS — R002 Palpitations: Secondary | ICD-10-CM

## 2013-04-22 DIAGNOSIS — E782 Mixed hyperlipidemia: Secondary | ICD-10-CM | POA: Diagnosis present

## 2013-04-22 DIAGNOSIS — E785 Hyperlipidemia, unspecified: Secondary | ICD-10-CM | POA: Insufficient documentation

## 2013-04-22 DIAGNOSIS — R079 Chest pain, unspecified: Principal | ICD-10-CM | POA: Insufficient documentation

## 2013-04-22 DIAGNOSIS — E876 Hypokalemia: Secondary | ICD-10-CM | POA: Diagnosis present

## 2013-04-22 DIAGNOSIS — R0602 Shortness of breath: Secondary | ICD-10-CM | POA: Insufficient documentation

## 2013-04-22 DIAGNOSIS — Z8673 Personal history of transient ischemic attack (TIA), and cerebral infarction without residual deficits: Secondary | ICD-10-CM | POA: Insufficient documentation

## 2013-04-22 DIAGNOSIS — Z7982 Long term (current) use of aspirin: Secondary | ICD-10-CM | POA: Insufficient documentation

## 2013-04-22 LAB — GLUCOSE, CAPILLARY
Glucose-Capillary: 105 mg/dL — ABNORMAL HIGH (ref 70–99)
Glucose-Capillary: 144 mg/dL — ABNORMAL HIGH (ref 70–99)

## 2013-04-22 LAB — CBC WITH DIFFERENTIAL/PLATELET
Basophils Absolute: 0.1 10*3/uL (ref 0.0–0.1)
Basophils Relative: 1 % (ref 0–1)
Eosinophils Absolute: 0.2 10*3/uL (ref 0.0–0.7)
Eosinophils Relative: 2 % (ref 0–5)
HCT: 45.3 % (ref 39.0–52.0)
Hemoglobin: 16 g/dL (ref 13.0–17.0)
Lymphocytes Relative: 7 % — ABNORMAL LOW (ref 12–46)
Lymphs Abs: 0.7 10*3/uL (ref 0.7–4.0)
MCH: 33.8 pg (ref 26.0–34.0)
MCHC: 35.3 g/dL (ref 30.0–36.0)
MCV: 95.8 fL (ref 78.0–100.0)
Monocytes Absolute: 0.5 10*3/uL (ref 0.1–1.0)
Monocytes Relative: 5 % (ref 3–12)
Neutro Abs: 8.2 10*3/uL — ABNORMAL HIGH (ref 1.7–7.7)
Neutrophils Relative %: 86 % — ABNORMAL HIGH (ref 43–77)
Platelets: 287 10*3/uL (ref 150–400)
RBC: 4.73 MIL/uL (ref 4.22–5.81)
RDW: 12.5 % (ref 11.5–15.5)
WBC: 9.6 10*3/uL (ref 4.0–10.5)

## 2013-04-22 LAB — LIPID PANEL
Cholesterol: 219 mg/dL — ABNORMAL HIGH (ref 0–200)
HDL: 34 mg/dL — ABNORMAL LOW (ref >39)
LDL Cholesterol: 124 mg/dL — ABNORMAL HIGH (ref 0–99)
Total CHOL/HDL Ratio: 6.4 RATIO
Triglycerides: 306 mg/dL — ABNORMAL HIGH (ref <150)
VLDL: 61 mg/dL — ABNORMAL HIGH (ref 0–40)

## 2013-04-22 LAB — TROPONIN I
Troponin I: <0.3 ng/mL (ref <0.30)
Troponin I: <0.3 ng/mL (ref <0.30)

## 2013-04-22 LAB — BASIC METABOLIC PANEL
BUN: 14 mg/dL (ref 6–23)
CO2: 24 mEq/L (ref 19–32)
Calcium: 8.9 mg/dL (ref 8.4–10.5)
Chloride: 100 mEq/L (ref 96–112)
Creatinine, Ser: 0.74 mg/dL (ref 0.50–1.35)
GFR calc Af Amer: >90 mL/min (ref >90)
GFR calc non Af Amer: >90 mL/min (ref >90)
Glucose, Bld: 154 mg/dL — ABNORMAL HIGH (ref 70–99)
Potassium: 3.3 mEq/L — ABNORMAL LOW (ref 3.7–5.3)
Sodium: 141 mEq/L (ref 137–147)

## 2013-04-22 MED ORDER — ONDANSETRON HCL 4 MG PO TABS: 4.0000 mg | ORAL_TABLET | Freq: Four times a day (QID) | ORAL | Status: DC | PRN

## 2013-04-22 MED ORDER — LISINOPRIL-HYDROCHLOROTHIAZIDE 20-12.5 MG PO TABS: 1.0000 | ORAL_TABLET | Freq: Two times a day (BID) | ORAL | Status: DC

## 2013-04-22 MED ORDER — SENNOSIDES-DOCUSATE SODIUM 8.6-50 MG PO TABS: 1.0000 | ORAL_TABLET | Freq: Every evening | ORAL | Status: DC | PRN

## 2013-04-22 MED ORDER — ACETAMINOPHEN 325 MG PO TABS
650.0000 mg | ORAL_TABLET | Freq: Once | ORAL | Status: AC
  Administered 2013-04-22: 650 mg via ORAL
  Filled 2013-04-22: qty 2

## 2013-04-22 MED ORDER — SODIUM CHLORIDE 0.9 % IJ SOLN: 3.0000 mL | INTRAMUSCULAR | Status: DC | PRN

## 2013-04-22 MED ORDER — INSULIN ASPART 100 UNIT/ML ~~LOC~~ SOLN
0.0000 [IU] | Freq: Three times a day (TID) | SUBCUTANEOUS | Status: DC
Start: 2013-04-22 — End: 2013-04-23

## 2013-04-22 MED ORDER — ASPIRIN EC 325 MG PO TBEC
325.0000 mg | DELAYED_RELEASE_TABLET | Freq: Every day | ORAL | Status: DC
  Administered 2013-04-23: 325 mg via ORAL
  Filled 2013-04-22: qty 1

## 2013-04-22 MED ORDER — MORPHINE SULFATE 2 MG/ML IJ SOLN: 1.0000 mg | INTRAMUSCULAR | Status: DC | PRN

## 2013-04-22 MED ORDER — NITROGLYCERIN 0.4 MG SL SUBL
0.4000 mg | SUBLINGUAL_TABLET | SUBLINGUAL | Status: DC | PRN
  Administered 2013-04-22: 0.4 mg via SUBLINGUAL
  Filled 2013-04-22: qty 25

## 2013-04-22 MED ORDER — METOPROLOL TARTRATE 25 MG PO TABS
12.5000 mg | ORAL_TABLET | Freq: Two times a day (BID) | ORAL | Status: DC
  Administered 2013-04-22 – 2013-04-23 (×2): 12.5 mg via ORAL
  Filled 2013-04-22 (×2): qty 1

## 2013-04-22 MED ORDER — SODIUM CHLORIDE 0.9 % IV SOLN: 250.0000 mL | INTRAVENOUS | Status: DC | PRN

## 2013-04-22 MED ORDER — GABAPENTIN 100 MG PO CAPS
200.0000 mg | ORAL_CAPSULE | Freq: Three times a day (TID) | ORAL | Status: DC
  Administered 2013-04-22 – 2013-04-23 (×3): 200 mg via ORAL
  Filled 2013-04-22 (×3): qty 2

## 2013-04-22 MED ORDER — ONDANSETRON HCL 4 MG/2ML IJ SOLN: 4.0000 mg | Freq: Four times a day (QID) | INTRAMUSCULAR | Status: DC | PRN

## 2013-04-22 MED ORDER — ASPIRIN 81 MG PO CHEW
324.0000 mg | CHEWABLE_TABLET | Freq: Once | ORAL | Status: AC
  Administered 2013-04-22: 324 mg via ORAL
  Filled 2013-04-22: qty 4

## 2013-04-22 MED ORDER — PNEUMOCOCCAL VAC POLYVALENT 25 MCG/0.5ML IJ INJ
0.5000 mL | INJECTION | INTRAMUSCULAR | Status: AC
  Administered 2013-04-23: 0.5 mL via INTRAMUSCULAR
  Filled 2013-04-22: qty 0.5

## 2013-04-22 MED ORDER — SODIUM CHLORIDE 0.9 % IV BOLUS (SEPSIS)
500.0000 mL | Freq: Once | INTRAVENOUS | Status: AC
  Administered 2013-04-22: 500 mL via INTRAVENOUS

## 2013-04-22 MED ORDER — LISINOPRIL 10 MG PO TABS
20.0000 mg | ORAL_TABLET | Freq: Two times a day (BID) | ORAL | Status: DC
  Administered 2013-04-22 – 2013-04-23 (×2): 20 mg via ORAL
  Filled 2013-04-22 (×2): qty 2

## 2013-04-22 MED ORDER — SIMVASTATIN 20 MG PO TABS
40.0000 mg | ORAL_TABLET | Freq: Every day | ORAL | Status: DC
  Administered 2013-04-22 – 2013-04-23 (×2): 40 mg via ORAL
  Filled 2013-04-22 (×2): qty 2

## 2013-04-22 MED ORDER — SODIUM CHLORIDE 0.9 % IJ SOLN: 3.0000 mL | Freq: Two times a day (BID) | INTRAMUSCULAR | Status: DC

## 2013-04-22 MED ORDER — SODIUM CHLORIDE 0.9 % IJ SOLN
3.0000 mL | Freq: Two times a day (BID) | INTRAMUSCULAR | Status: DC
  Administered 2013-04-22: 3 mL via INTRAVENOUS

## 2013-04-22 MED ORDER — HYDROCHLOROTHIAZIDE 12.5 MG PO CAPS
12.5000 mg | ORAL_CAPSULE | Freq: Two times a day (BID) | ORAL | Status: DC
  Administered 2013-04-22 – 2013-04-23 (×2): 12.5 mg via ORAL
  Filled 2013-04-22 (×2): qty 1

## 2013-04-22 MED ORDER — ACETAMINOPHEN 650 MG RE SUPP: 650.0000 mg | Freq: Four times a day (QID) | RECTAL | Status: DC | PRN

## 2013-04-22 MED ORDER — REGADENOSON 0.4 MG/5ML IV SOLN
0.4000 mg | Freq: Once | INTRAVENOUS | Status: DC
  Filled 2013-04-22: qty 5

## 2013-04-22 MED ORDER — ACETAMINOPHEN 325 MG PO TABS: 650.0000 mg | ORAL_TABLET | Freq: Four times a day (QID) | ORAL | Status: DC | PRN

## 2013-04-22 MED ORDER — ALUM & MAG HYDROXIDE-SIMETH 200-200-20 MG/5ML PO SUSP
30.0000 mL | Freq: Four times a day (QID) | ORAL | Status: DC | PRN
Start: 2013-04-22 — End: 2013-04-23

## 2013-04-22 MED ORDER — ENOXAPARIN SODIUM 40 MG/0.4ML ~~LOC~~ SOLN
40.0000 mg | SUBCUTANEOUS | Status: DC
  Administered 2013-04-22: 40 mg via SUBCUTANEOUS
  Filled 2013-04-22: qty 0.4

## 2013-04-22 MED ORDER — POTASSIUM CHLORIDE CRYS ER 20 MEQ PO TBCR
40.0000 meq | EXTENDED_RELEASE_TABLET | Freq: Once | ORAL | Status: AC
  Administered 2013-04-22: 40 meq via ORAL
  Filled 2013-04-22: qty 2

## 2013-04-22 MED ORDER — PANTOPRAZOLE SODIUM 40 MG PO TBEC
40.0000 mg | DELAYED_RELEASE_TABLET | Freq: Every day | ORAL | Status: DC
  Administered 2013-04-22 – 2013-04-23 (×2): 40 mg via ORAL
  Filled 2013-04-22 (×2): qty 1

## 2013-04-22 NOTE — H&P (Signed)
Patient seen, independently examined and chart reviewed. I agree with exam, assessment and plan discussed with Jimmy Carrel, NP.  58 year old man with history of nonobstructive coronary artery disease, diabetes, hypertension, hyperlipidemia presented with chest pain. 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 Associated with generalized weakness, diaphoresis, nausea. No specific aggravating or alleviating factors. No recent similar events. Initial evaluation in the emergency department was reassuring and the patient was pain-free. Admitted for chest pain evaluation.    PMH Nonobstructive coronary artery disease Diabetes mellitus type 2 Hyperlipidemia Hypertension Carotid artery disease COPD TIA?  Objective: Afebrile, vital signs stable. He appears calm and comfortable. Speech is fluent and clear. Cardiovascular regular rate and rhythm. No murmur, rub or gallop. No lower extremity edema. Dorsalis pedis pulses 2+ bilaterally. Respiratory clear to auscultation bilaterally. No wheezes, rales or rhonchi. Normal respiratory effort. Abdomen soft nontender and nondistended. Skin normal without rash or induration. Psychiatric grossly normal mood and affect. Speech fluent and appropriate. Grossly normal tone and strength all joints. Neurologic exam is grossly normal. Eyes pupils equal round react to light. Normal lids, irises. ENT appears grossly unremarkable.  Basic metabolic panel unremarkable. Troponin negative. Total cholesterol 219, LDL 124, triglycerides 36, random sample. CBC unremarkable. Chest x-ray suggested chronic bronchitis, no acute cardiopulmonary disease. Atelectasis. EKG showed sinus tachycardia, no acute changes.  Plan observation for further evaluation of chest pain. Some typical features, risk factors include diabetes, hypertension, hyperlipidemia, known nonobstructive coronary artery disease. Currently pain-free, EKG nonacute, troponin  negative. Cardiology consultation appreciated. Plan nuclear stress testing tomorrow.  Strongly recommend smoking cessation.  Murray Hodgkins, MD [facility] [phone]

## 2013-04-22 NOTE — ED Notes (Signed)
Pt c/o cp with lightheadedness/sweating/sob/weakness.

## 2013-04-22 NOTE — ED Notes (Signed)
Pt c/o left side chest pain that he describes as tightness. Pt reports associated SOB, diaphoresis and lightheadedness. Pt states symptoms began around 11:45am this morning. Pt states "I've have a bunch of mini-strokes before and they felt like this". Pt denies headache, unilateral weakness, slurred speech.

## 2013-04-22 NOTE — ED Provider Notes (Signed)
CSN: [id number]     Arrival date & time 04/22/13  1306 History  This chart was scribed for Charles B. Karle Starch, MD by Ludger Nutting, ED Scribe. This patient was seen in room [facility] and the patient's care was started 1:38 PM.    Chief Complaint  Patient presents with  . Chest Pain      The history is provided by the patient. No language interpreter was used.    HPI Comments: Jimmy Duran is a 58 y.o. male with past medical history of CAD, DM, HLD, HTN, COPD who presents to the Emergency Department complaining of chest pain that began 3-4 hours ago. He describes this pain as tightness and rates it as 4/10, 7/10 at max. He states the pain began after arriving to work. He reports a history of non-obstructive CAD and cardiac catheterization many years ago. He denies nausea, vomiting.    Past Medical History  Diagnosis Date  . Coronary artery disease     Nonobstructive  . Type 2 diabetes mellitus   . Mixed hyperlipidemia   . Essential hypertension, benign   . Carotid artery disease   . COPD (chronic obstructive pulmonary disease)   . GERD (gastroesophageal reflux disease)   . Hemorrhoids   . Diabetes mellitus   . Stroke     "ministrokes"  . Arthritis   . Colon polyp    Past Surgical History  Procedure Laterality Date  . Appendectomy  1970  . Cholecystectomy  1993  . Umbilical hernia repair  1993  . Breast surgery      Cyst resection on the right  . Knee surgery      Left knee arthroscopy torn medial meniscus grade 4 chondral changes medial femoral condyle tibial plateau  . Total knee arthroplasty Left 04/27/2012    Procedure: TOTAL KNEE ARTHROPLASTY;  Surgeon: Carole Civil, MD;  Location: [facility];  Service: Orthopedics;  Laterality: Left;  . Colonoscopy  02/12/2007    SHF:[id number] friable anal canal hemorrhoids, otherwise normal rectum and colon  . Colonoscopy N/A 07/16/2012    Procedure: COLONOSCOPY;  Surgeon: [name], MD;  Location: [facility];  Service:  Endoscopy;  Laterality: N/A;  1:15-moved to [place] to notify pt  . Esophagogastroduodenoscopy (egd) with esophageal dilation N/A 07/16/2012    Procedure: ESOPHAGOGASTRODUODENOSCOPY (EGD) WITH ESOPHAGEAL DILATION;  Surgeon: [name], MD;  Location: [facility];  Service: Endoscopy;  Laterality: N/A;   Family History  Problem Relation Age of Onset  . Coronary artery disease Father     CABG in his 37's  . Colon cancer Other     maternal great aunt  . Colon cancer Other     paternal great aunt  . Colon polyps Mother    History  Substance Use Topics  . Smoking status: Current Every Day Smoker -- 0.50 packs/day for 30 years    Types: Cigarettes  . Smokeless tobacco: Current User    Types: Chew  . Alcohol Use: Yes     Comment: Socially    Review of Systems  A complete 10 system review of systems was obtained and all systems are negative except as noted in the HPI and PMH.    Allergies  Review of patient's allergies indicates no known allergies.  Home Medications   Current Outpatient Rx  Name  Route  Sig  Dispense  Refill  . aspirin EC 325 MG EC tablet   Oral   Take 1 tablet (325 mg  total) by mouth daily.         . diphenhydrAMINE (BENADRYL) 25 MG tablet   Oral   Take 25 mg by mouth daily as needed for allergies.          . fish oil-omega-3 fatty acids 1000 MG capsule   Oral   Take 2 g by mouth 2 (two) times daily.          Marland Kitchen gabapentin (NEURONTIN) 100 MG capsule   Oral   Take 2 capsules (200 mg total) by mouth 3 (three) times daily.   90 capsule   2   . lisinopril-hydrochlorothiazide (PRINZIDE,ZESTORETIC) 20-12.5 MG per tablet   Oral   Take 1 tablet by mouth 2 (two) times daily.   60 tablet   11   . ondansetron (ZOFRAN) 4 MG tablet   Oral   Take 1 tablet (4 mg total) by mouth every 6 (six) hours.   12 tablet   0   . pantoprazole (PROTONIX) 40 MG tablet   Oral   Take 1 tablet (40 mg total) by mouth daily.   30 tablet   11   .  simvastatin (ZOCOR) 40 MG tablet   Oral   Take 40 mg by mouth daily.           BP 134/84  Pulse 105  Temp(Src) 98.8 F (37.1 C) (Oral)  Resp 18  SpO2 94% Physical Exam  Nursing note and vitals reviewed. Constitutional: He is oriented to person, place, and time. He appears well-developed and well-nourished.  HENT:  Head: Normocephalic and atraumatic.  Eyes: EOM are normal. Pupils are equal, round, and reactive to light.  Neck: Normal range of motion. Neck supple.  Cardiovascular: Normal rate, regular rhythm, normal heart sounds and intact distal pulses.   Pulmonary/Chest: Effort normal and breath sounds normal.  Abdominal: Bowel sounds are normal. He exhibits no distension. There is no tenderness.  Musculoskeletal: Normal range of motion. He exhibits no edema and no tenderness.  Neurological: He is alert and oriented to person, place, and time. He has normal strength. No cranial nerve deficit or sensory deficit.  Skin: Skin is warm and dry. No rash noted.  Psychiatric: He has a normal mood and affect.    ED Course  Procedures (including critical care time)  DIAGNOSTIC STUDIES: Oxygen Saturation is 94% on RA, adequate by my interpretation.    COORDINATION OF CARE: 1:42 PM Discussed treatment plan with pt at bedside and pt agreed to plan.   Labs Review Labs Reviewed  CBC WITH DIFFERENTIAL - Abnormal; Notable for the following:    Neutrophils Relative % 86 (*)    Neutro Abs 8.2 (*)    Lymphocytes Relative 7 (*)    All other components within normal limits  BASIC METABOLIC PANEL - Abnormal; Notable for the following:    Potassium 3.3 (*)    Glucose, Bld 154 (*)    All other components within normal limits  HEMOGLOBIN A1C - Abnormal; Notable for the following:    Hemoglobin A1C 6.4 (*)    Mean Plasma Glucose 137 (*)    All other components within normal limits  LIPID PANEL - Abnormal; Notable for the following:    Cholesterol 219 (*)    Triglycerides 306 (*)    HDL  34 (*)    VLDL 61 (*)    LDL Cholesterol 124 (*)    All other components within normal limits  GLUCOSE, CAPILLARY - Abnormal; Notable for the following:  Glucose-Capillary 105 (*)    All other components within normal limits  BASIC METABOLIC PANEL - Abnormal; Notable for the following:    Potassium 3.6 (*)    Glucose, Bld 144 (*)    All other components within normal limits  GLUCOSE, CAPILLARY - Abnormal; Notable for the following:    Glucose-Capillary 144 (*)    All other components within normal limits  GLUCOSE, CAPILLARY - Abnormal; Notable for the following:    Glucose-Capillary 158 (*)    All other components within normal limits  TROPONIN I  TROPONIN I  TROPONIN I  TSH  MAGNESIUM   Imaging Review No results found.  EKG Interpretation    Date/Time:  Thursday April 22 2013 13:17:00 EST Ventricular Rate:  104 PR Interval:  158 QRS Duration: 84 QT Interval:  342 QTC Calculation: 449 R Axis:   29 Text Interpretation:  Sinus tachycardia Otherwise normal ECG When compared with ECG of 07-Jun-2011 12:16, No significant change was found Confirmed by SHELDON  MD, CHARLES ([id number]) on 04/22/2013 1:23:51 PM            MDM   Final diagnoses:  Chest pain    Pt with multiple risk factors, history of non-obstructive CAD. Trop neg, no ischemic changes. Plan admission for rule out.  I personally performed the services described in this documentation, which was scribed in my presence. The recorded information has been reviewed and is accurate.      Charles B. Karle Starch, MD 04/30/13 [phone]

## 2013-04-22 NOTE — H&P (Signed)
[facility] History and Physical  Jimmy Duran [id number] DOB: 1955-08-31 DOA: 04/22/2013  Referring physician: Karle Starch PCP:   Chief Complaint: chest pain  HPI: Jimmy Duran is a 58 y.o. male with a past medical history that includes hypertension, hyperlipidemia, diabetes, nonobstructive coronary artery disease, carotid artery disease, as as to the emergency department with the chief complaint of chest pain. Information is obtained from the patient. He states that he was in his usual state of health until this morning when he developed sudden left anterior chest pressure. 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 He describes the pain as a pressure and reports that radiated to underneath his left arm. Associated symptoms include shortness of breath, diaphoresis, nausea. He also reports he had one episode of emesis at home this morning but did not feel nauseated. He reports having "stomach flu" approximately 2 weeks ago but has felt fine since then. He reports that the episode lasted 10-15 minutes and he sat down until it resolved. Afterwards he got up and proceeded back to work but developed generalized weakness and overall "not feeling well". Decided to come to the emergency room for evaluation. He states his cardiologist is Dr. Domenic Polite. He reports having had a cardiac catheterization "several years ago" in [place]. He has taken his medications as directed. He has stopped checking his blood sugar as he has run out of strips. He reports intermittent lower extremity edema but denies orthopnea. He does sleep propped up on 2 pillows but attributes this to reflux. He denies abdominal pain dysuria hematuria frequency or urgency. He denies constipation melena and his last bowel movement was this morning and it was normal in color and consistency. Workup in the emergency room yields a basic metabolic panel with a  potassium of 3.3 and glucose of 154 otherwise unremarkable, CBC unremarkable, chest x-ray minimal linear atelectasis in the lingula. Stable mild changes of chronic bronchitis and/or asthma. No acute cardiopulmonary disease and EKG with sinus tachycardia otherwise no acute changes, initial troponin negative. At the time of my exam he is pain free. His vital signs are stable and he is not hypoxic. We are asked to admit for further evaluation and  Review of Systems:  10 point review of systems completed and all systems are negative except as indicated in the history of present illness Past Medical History  Diagnosis Date  . Coronary artery disease     Nonobstructive  . Type 2 diabetes mellitus   . Mixed hyperlipidemia   . Essential hypertension, benign   . Carotid artery disease   . COPD (chronic obstructive pulmonary disease)   . GERD (gastroesophageal reflux disease)   . Hemorrhoids   . Diabetes mellitus   . Stroke     "ministrokes"  . Arthritis   . Colon polyp    Past Surgical History  Procedure Laterality Date  . Appendectomy  1970  . Cholecystectomy  1993  . Umbilical hernia repair  1993  . Breast surgery      Cyst resection on the right  . Knee surgery      Left knee arthroscopy torn medial meniscus grade 4 chondral changes medial femoral condyle tibial plateau  . Total knee arthroplasty Left 04/27/2012    Procedure: TOTAL KNEE ARTHROPLASTY;  Surgeon: Carole Civil, MD;  Location: [facility];  Service: Orthopedics;  Laterality: Left;  . Colonoscopy  02/12/2007    [id number] friable anal  canal hemorrhoids, otherwise normal rectum and colon  . Colonoscopy N/A 07/16/2012    Procedure: COLONOSCOPY;  Surgeon: [name], MD;  Location: [facility];  Service: Endoscopy;  Laterality: N/A;  1:15-moved to [place] to notify pt  . Esophagogastroduodenoscopy (egd) with esophageal dilation N/A 07/16/2012    Procedure: ESOPHAGOGASTRODUODENOSCOPY (EGD) WITH ESOPHAGEAL DILATION;   Surgeon: [name], MD;  Location: [facility];  Service: Endoscopy;  Laterality: N/A;   Social History:  reports that he has been smoking Cigarettes.  He has a 15 pack-year smoking history. His smokeless tobacco use includes Chew. He reports that he drinks alcohol. He reports that he does not use illicit drugs. He lives with his wife he's employed as a Licensed conveyancer. He is independent with ADLs No Known Allergies  Family History  Problem Relation Age of Onset  . Coronary artery disease Father     CABG in his 24's  . Colon cancer Other     maternal great aunt  . Colon cancer Other     paternal great aunt  . Colon polyps Mother      Prior to Admission medications   Medication Sig Start Date End Date Taking? Authorizing Provider  aspirin EC 325 MG EC tablet Take 1 tablet (325 mg total) by mouth daily. 04/30/12  Yes Carole Civil, MD  diphenhydrAMINE (BENADRYL) 25 MG tablet Take 25 mg by mouth daily as needed for allergies.    Yes Historical Provider, MD  fish oil-omega-3 fatty acids 1000 MG capsule Take 2 g by mouth 2 (two) times daily.    Yes Historical Provider, MD  gabapentin (NEURONTIN) 100 MG capsule Take 2 capsules (200 mg total) by mouth 3 (three) times daily. 07/23/12  Yes Carole Civil, MD  lisinopril-hydrochlorothiazide (PRINZIDE,ZESTORETIC) 20-12.5 MG per tablet Take 1 tablet by mouth 2 (two) times daily. 12/11/12  Yes Satira Sark, MD  pantoprazole (PROTONIX) 40 MG tablet Take 1 tablet (40 mg total) by mouth daily. 07/02/12  Yes Mahala Menghini, PA-C  simvastatin (ZOCOR) 40 MG tablet Take 40 mg by mouth daily.    Yes Historical Provider, MD   Physical Exam: Filed Vitals:   04/22/13 1510  BP: 119/72  Pulse: 88  Temp:   Resp: 15    BP 119/72  Pulse 88  Temp(Src) 98.8 F (37.1 C) (Oral)  Resp 15  SpO2 98%  General:  Appears calm and comfortable and older than his stated age of 57 Eyes: PERRL, normal lids, irises & conjunctiva ENT: grossly normal  hearing, lips & tongue, because membranes of his mouth are pink slightly dry Neck: no LAD, masses or thyromegaly Cardiovascular: RRR, no m/r/g. No LE edema. Pedal pulses are present and palpable Telemetry: SR, no arrhythmias  Respiratory: Normal effort. Breath sounds somewhat coarse with faint expiratory wheeze on left. No crackles Abdomen: soft, ntnd positive bowel sounds throughout Skin: no rash or induration seen on limited exam Musculoskeletal: grossly normal tone BUE/BLE Psychiatric: grossly normal mood and affect, speech fluent and appropriate Neurologic: grossly non-focal.          Labs on Admission:  Basic Metabolic Panel:  Recent Labs Lab 04/22/13 1359  NA 141  K 3.3*  CL 100  CO2 24  GLUCOSE 154*  BUN 14  CREATININE 0.74  CALCIUM 8.9   Liver Function Tests: No results found for this basename: AST, ALT, ALKPHOS, BILITOT, PROT, ALBUMIN,  in the last 168 hours No results found for this basename:  LIPASE, AMYLASE,  in the last 168 hours No results found for this basename: AMMONIA,  in the last 168 hours CBC:  Recent Labs Lab 04/22/13 1359  WBC 9.6  NEUTROABS 8.2*  HGB 16.0  HCT 45.3  MCV 95.8  PLT 287   Cardiac Enzymes:  Recent Labs Lab 04/22/13 1359  TROPONINI <0.30    BNP (last 3 results) No results found for this basename: PROBNP,  in the last 8760 hours CBG: No results found for this basename: GLUCAP,  in the last 168 hours  Radiological Exams on Admission: Dg Chest 2 View  04/22/2013   CLINICAL DATA:  Chest pain.  EXAM: CHEST  2 VIEW  COMPARISON:  05/18/2010, 12/03/2006.  FINDINGS: Cardiomediastinal silhouette unremarkable and unchanged. Stable mild hyperinflation and mild central peribronchial thickening. Minimal linear atelectasis in the lingula. Lungs otherwise clear. No localized airspace consolidation. No pleural effusions. No pneumothorax. Normal pulmonary vascularity. Visualized bony thorax intact.  IMPRESSION: Minimal linear atelectasis in  the lingula. Stable mild changes of chronic bronchitis and/or asthma. No acute cardiopulmonary disease otherwise.   Electronically Signed   By: Evangeline Dakin M.D.   On: 04/22/2013 14:35    EKG: Independently reviewed sinus tachycardia and no acute change  Assessment/Plan Principal Problem:   Chest pain: Somewhat typical. In patient with known nonobstructive coronary artery disease. Other risk factors hypertension, diabetes, hyperlipidemia, smoking. Will admit to telemetry for observation and rule out. Patient has already been seen by Dr. branch with cardiology and will be scheduled for stress test tomorrow. We'll cycle his cardiac enzymes, provide oxygen as needed, nitroglycerin for any further chest pain. Continue aspirin. Will also request 2-D echo as patient describes worsening intermittent lower extremity edema. N.p.o. past midnight Active Problems: Hypokalemia: Mild. Will replete and recheck    Mixed hyperlipidemia: Will obtain a lipid panel. Will continue his home statin.   Diabetes: Patient reports being on "a pill for his diabetes but none is listed in his home medications. He has not checked his sugar in several weeks as he has run out of strips. Glucose 154 on admission. Will obtain a hemoglobin A1c and use sliding scale insulin for optimal control.   Essential hypertension, benign: Systolic blood pressure range in the emergency department 112-119. Home medications include lisinopril and hydrochlorothiazide. Will continue these.      COPD (chronic obstructive pulmonary disease): Appears to be stable at baseline.  GERD (gastroesophageal reflux disease): Stable at baseline. Continue PPI   CORONARY ATHEROSCLEROSIS NATIVE CORONARY ARTERY: The patient sees Dr. Domenic Polite. Last seen in the office April 2014. See #1.    CAROTID ARTERY DISEASE: Cardiology note from last year indicates mild/moderate disease. Note indicated plan for followup carotid Dopplers in April of 2015.        Tobacco  use disorder: Counseled regarding cessation.   Dr. branch with cardiology.  Code Status: Full Family Communication: Wife at bedside Disposition Plan: Home hopefully tomorrow if stress test negative  Time spent: 62 minutes  [facility]

## 2013-04-22 NOTE — Consult Note (Signed)
Primary cardiologist: Dr. [name] Consulting cardiologist: Dr. Carlyle Dolly  Clinical Summary Mr. Gainor is a 58 y.o.male with history of non-obstructive CAD by remote cath, DM2, hyperlipidemia, HTN, carotid stenosis, and prior CVA admitted with chest pain. Reports several month history of non-specific sharp chest pain. 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 Feeling of 5/10 chest pressure in left chest radiating down left arm with assoc SOB and diaphoresis. Lasts approx 10 minutes. Had some mild 3/10 lingering pain once arrived to ER that resolved with SL NG. He also reports a 3 month history of intermittent palpitations. Can come on at rest or with exertion, feeling of heart racing with associated lightheadedness. Feels like episodes are increasing in frequency over the last few weeks.   EKG shows NSR with no ischemic changes, initial troponin is negative.    No Known Allergies  Medications Scheduled Medications:     Infusions:     PRN Medications:  nitroGLYCERIN   Past Medical History  Diagnosis Date  . Coronary artery disease     Nonobstructive  . Type 2 diabetes mellitus   . Mixed hyperlipidemia   . Essential hypertension, benign   . Carotid artery disease   . COPD (chronic obstructive pulmonary disease)   . GERD (gastroesophageal reflux disease)   . Hemorrhoids   . Diabetes mellitus   . Stroke     "ministrokes"  . Arthritis   . Colon polyp     Past Surgical History  Procedure Laterality Date  . Appendectomy  1970  . Cholecystectomy  1993  . Umbilical hernia repair  1993  . Breast surgery      Cyst resection on the right  . Knee surgery      Left knee arthroscopy torn medial meniscus grade 4 chondral changes medial femoral condyle tibial plateau  . Total knee arthroplasty Left 04/27/2012    Procedure: TOTAL KNEE ARTHROPLASTY;  Surgeon: Carole Civil, MD;  Location: [facility];  Service: Orthopedics;   Laterality: Left;  . Colonoscopy  02/12/2007    [id number] friable anal canal hemorrhoids, otherwise normal rectum and colon  . Colonoscopy N/A 07/16/2012    Procedure: COLONOSCOPY;  Surgeon: [name], MD;  Location: [facility];  Service: Endoscopy;  Laterality: N/A;  1:15-moved to [place] to notify pt  . Esophagogastroduodenoscopy (egd) with esophageal dilation N/A 07/16/2012    Procedure: ESOPHAGOGASTRODUODENOSCOPY (EGD) WITH ESOPHAGEAL DILATION;  Surgeon: [name], MD;  Location: [facility];  Service: Endoscopy;  Laterality: N/A;    Family History  Problem Relation Age of Onset  . Coronary artery disease Father     CABG in his 43's  . Colon cancer Other     maternal great aunt  . Colon cancer Other     paternal great aunt  . Colon polyps Mother     Social History Mr. Mahrt reports that he has been smoking Cigarettes.  He has a 15 pack-year smoking history. His smokeless tobacco use includes Chew. Mr. Dedman reports that he drinks alcohol.  Review of Systems CONSTITUTIONAL: No weight loss, fever, chills, weakness or fatigue.  HEENT: Eyes: No visual loss, blurred vision, double vision or yellow sclerae. No hearing loss, sneezing, congestion, runny nose or sore throat.  SKIN: No rash or itching.  CARDIOVASCULAR: per HPI RESPIRATORY: per HPI GASTROINTESTINAL: No anorexia, nausea, vomiting or diarrhea. No abdominal pain or blood.  GENITOURINARY: no polyuria, no dysuria NEUROLOGICAL: No headache, dizziness,  syncope, paralysis, ataxia, numbness or tingling in the extremities. No change in bowel or bladder control.  MUSCULOSKELETAL: No muscle, back pain, joint pain or stiffness.  HEMATOLOGIC: No anemia, bleeding or bruising.  LYMPHATICS: No enlarged nodes. No history of splenectomy.  PSYCHIATRIC: No history of depression or anxiety.      Physical Examination Blood pressure 119/72, pulse 88, temperature 98.8 F (37.1 C), temperature source Oral, resp.  rate 15, SpO2 98.00%. No intake or output data in the 24 hours ending 04/22/13 1525  HEENT  Cardiovascular: RRR, no m/r/  Respiratory: bilateral expiratory wheezing  GI: abdomen soft, NT, ND  MSK: extremities are warm, no edema  Neuro: no focal deficits    Lab Results  Basic Metabolic Panel:  Recent Labs Lab 04/22/13 1359  NA 141  K 3.3*  CL 100  CO2 24  GLUCOSE 154*  BUN 14  CREATININE 0.74  CALCIUM 8.9    Liver Function Tests: No results found for this basename: AST, ALT, ALKPHOS, BILITOT, PROT, ALBUMIN,  in the last 168 hours  CBC:  Recent Labs Lab 04/22/13 1359  WBC 9.6  NEUTROABS 8.2*  HGB 16.0  HCT 45.3  MCV 95.8  PLT 287    Cardiac Enzymes:  Recent Labs Lab 04/22/13 1359  TROPONINI <0.30    BNP: No components found with this basename: POCBNP,    ECG NSR, no ischemic changes.   Imaging CXR IMPRESSION:  Minimal linear atelectasis in the lingula. Stable mild changes of  chronic bronchitis and/or asthma. No acute cardiopulmonary disease  otherwise.   Impression/Recommendations 1.Chest pain -unclear etiology at this time, he does have several CAD risk factors - no current evidence of ACS, he is currently pain free.  - if true unstable angina, his TIMI score is 2 consistent with lower risk for major cardiac events. - recommend admission for serial EKG and cardiac enzymes - medical therapy continuing his home  ASA, statin, ACE-I. In setting of possible angina as well as his history of palpitations recommend low dose beta blocker - since low TIMI score will not initiate anticoag at this time, if recurrence of pain recommend starting heparin gtt - please make NPO tonight at midnight, plan for Lexiscan MPI in AM. Patient cannot run due to history of knee replacement - please order echo  2. Palpitations - will follow on telemetry, initiate beta blocker as described above - please check TSH - please keep K at 4 and Mg at 2  3.  SOB/Wheezing - long smoking history, expiratory wheezing on exam. Do not see any PFTs in our system but likely hx of COPD. - will need outpatient PFTs   Carlyle Dolly, M.D., F.A.C.C.

## 2013-04-23 ENCOUNTER — Encounter (HOSPITAL_COMMUNITY): Payer: Self-pay

## 2013-04-23 ENCOUNTER — Observation Stay (HOSPITAL_COMMUNITY): Payer: BC Managed Care – PPO

## 2013-04-23 DIAGNOSIS — I517 Cardiomegaly: Secondary | ICD-10-CM

## 2013-04-23 DIAGNOSIS — E782 Mixed hyperlipidemia: Secondary | ICD-10-CM

## 2013-04-23 LAB — BASIC METABOLIC PANEL
BUN: 15 mg/dL (ref 6–23)
CO2: 26 mEq/L (ref 19–32)
Calcium: 9.1 mg/dL (ref 8.4–10.5)
Chloride: 101 mEq/L (ref 96–112)
Creatinine, Ser: 0.74 mg/dL (ref 0.50–1.35)
GFR calc Af Amer: >90 mL/min (ref >90)
GFR calc non Af Amer: >90 mL/min (ref >90)
Glucose, Bld: 144 mg/dL — ABNORMAL HIGH (ref 70–99)
Potassium: 3.6 mEq/L — ABNORMAL LOW (ref 3.7–5.3)
Sodium: 139 mEq/L (ref 137–147)

## 2013-04-23 LAB — GLUCOSE, CAPILLARY: Glucose-Capillary: 158 mg/dL — ABNORMAL HIGH (ref 70–99)

## 2013-04-23 LAB — HEMOGLOBIN A1C
Hgb A1c MFr Bld: 6.4 % — ABNORMAL HIGH (ref <5.7)
Mean Plasma Glucose: 137 mg/dL — ABNORMAL HIGH (ref <117)

## 2013-04-23 LAB — TROPONIN I: Troponin I: <0.3 ng/mL (ref <0.30)

## 2013-04-23 LAB — MAGNESIUM: Magnesium: 2 mg/dL (ref 1.5–2.5)

## 2013-04-23 LAB — TSH: TSH: 0.797 u[IU]/mL (ref 0.350–4.500)

## 2013-04-23 MED ORDER — REGADENOSON 0.4 MG/5ML IV SOLN
INTRAVENOUS | Status: AC
  Administered 2013-04-23: 0.4 mg via INTRAVENOUS
  Filled 2013-04-23: qty 5

## 2013-04-23 MED ORDER — METOPROLOL TARTRATE 25 MG PO TABS: 12.5000 mg | ORAL_TABLET | Freq: Two times a day (BID) | ORAL | Status: DC

## 2013-04-23 MED ORDER — TECHNETIUM TC 99M SESTAMIBI - CARDIOLITE
30.0000 | Freq: Once | INTRAVENOUS | Status: AC | PRN
  Administered 2013-04-23: 09:00:00 30 via INTRAVENOUS

## 2013-04-23 MED ORDER — POTASSIUM CHLORIDE CRYS ER 20 MEQ PO TBCR
40.0000 meq | EXTENDED_RELEASE_TABLET | Freq: Once | ORAL | Status: AC
  Administered 2013-04-23: 40 meq via ORAL
  Filled 2013-04-23: qty 2

## 2013-04-23 MED ORDER — SODIUM CHLORIDE 0.9 % IJ SOLN
INTRAMUSCULAR | Status: AC
  Administered 2013-04-23: 10 mL via INTRAVENOUS
  Filled 2013-04-23: qty 10

## 2013-04-23 MED ORDER — TECHNETIUM TC 99M SESTAMIBI GENERIC - CARDIOLITE
10.0000 | Freq: Once | INTRAVENOUS | Status: AC | PRN
  Administered 2013-04-23: 10 via INTRAVENOUS

## 2013-04-23 NOTE — Progress Notes (Signed)
*  PRELIMINARY RESULTS* Echocardiogram 2D Echocardiogram has been performed.  [place], [place] 04/23/2013, 9:40 AM

## 2013-04-23 NOTE — Progress Notes (Signed)
Stress Lab Nurses Notes - [place] 04/23/2013 Reason for doing test: Chest Pain Type of test: Wille Glaser Nurse performing test: Gerrit Halls, RN Nuclear Medicine Tech: Dyanne Carrel Echo Tech: Not Applicable MD performing test: Dr. Harl Bowie Family MD: Dr. [place] Crane Test explained and consent signed: yes IV started: 20g jelco, Saline lock flushed, No redness or edema and Saline lock from floor Symptoms: SOB  Treatment/Intervention: None Reason test stopped: protocol completed After recovery IV was: No redness or edema and Saline Lock flushed Patient to return to [place]. Med at : 9:30 Patient discharged: Transported back to room 315 via wc Patient's Condition upon discharge was: stable Comments: During test BP 113/81  & HR 103.  Recovery BP 98/72 & HR 92.  Symptoms resolved in recovery. Geanie Cooley T

## 2013-04-23 NOTE — Progress Notes (Addendum)
Pt is to be discharged home today. Pt is in NAD, IV is out, all paperwork has been reviewed/discussed with patient, and there are no questions/concerns at this time. Assessment is unchanged from this morning. Pt is to be accompanied downstairs by family via ambulation. Pt refused assistance from staff.

## 2013-04-23 NOTE — Progress Notes (Signed)
Patient ID: Jimmy Duran, male   DOB: [birth year], 58 y.o.   MRN: [id number]      Subjective:   No chest pain overnight   Objective:   Temp:  [97.8 F (36.6 C)-98.8 F (37.1 C)] 98.5 F (36.9 C) (02/20 0538) Pulse Rate:  [74-110] 74 (02/20 0538) Resp:  [15-20] 20 (02/20 0538) BP: (92-135)/(58-86) 116/65 mmHg (02/20 0538) SpO2:  [94 %-98 %] 94 % (02/20 0538) Weight:  [170 lb (77.111 kg)] 170 lb (77.111 kg) (02/19 1650) Last BM Date: 04/22/13  Filed Weights   04/22/13 1650  Weight: 170 lb (77.111 kg)    Intake/Output Summary (Last 24 hours) at 04/23/13 0900 Last data filed at 04/23/13 0855  Gross per 24 hour  Intake      0 ml  Output      0 ml  Net      0 ml     Exam:  General:NAD  Resp:CTAB  Cardiac:RRR, no m/r/g, no JVD, no carotid bruits  UY:[id number] soft, NT, ND  MSK: no LE edema  Neuro: no focal deficits   Lab Results:  Basic Metabolic Panel:  Recent Labs Lab 04/22/13 1359 04/23/13 0209  NA 141 139  K 3.3* 3.6*  CL 100 101  CO2 24 26  GLUCOSE 154* 144*  BUN 14 15  CREATININE 0.74 0.74  CALCIUM 8.9 9.1  MG  --  2.0    Liver Function Tests: No results found for this basename: AST, ALT, ALKPHOS, BILITOT, PROT, ALBUMIN,  in the last 168 hours  CBC:  Recent Labs Lab 04/22/13 1359  WBC 9.6  HGB 16.0  HCT 45.3  MCV 95.8  PLT 287    Cardiac Enzymes:  Recent Labs Lab 04/22/13 1359 04/22/13 1951 04/23/13 0209  TROPONINI <0.30 <0.30 <0.30    BNP: No results found for this basename: PROBNP,  in the last 8760 hours  Coagulation: No results found for this basename: INR,  in the last 168 hours  ECG:   Medications:   Scheduled Medications: . aspirin EC  325 mg Oral Daily  . enoxaparin (LOVENOX) injection  40 mg Subcutaneous Q24H  . gabapentin  200 mg Oral TID  . lisinopril  20 mg Oral BID   And  . hydrochlorothiazide  12.5 mg Oral BID  . insulin aspart  0-9 Units Subcutaneous TID WC  . metoprolol tartrate  12.5 mg  Oral BID  . pantoprazole  40 mg Oral Daily  . pneumococcal 23 valent vaccine  0.5 mL Intramuscular Tomorrow-1000  . potassium chloride  40 mEq Oral Once  . regadenoson      . regadenoson  0.4 mg Intravenous Once  . simvastatin  40 mg Oral Daily  . sodium chloride  3 mL Intravenous Q12H  . sodium chloride  3 mL Intravenous Q12H  . sodium chloride         Infusions:     PRN Medications:  sodium chloride, acetaminophen, acetaminophen, alum & mag hydroxide-simeth, morphine injection, nitroGLYCERIN, ondansetron (ZOFRAN) IV, ondansetron, senna-docusate, sodium chloride, technetium sestamibi     Assessment/Plan    1. Chest pain - no evidence of ACS by EKG or enzymes - symptoms suggestive of angina, multiple CAD risk factors. TIMI score of 2, consistent with lower risk. Was not anticoagulated.  - will follow up stress test and echo results today  2. Palpitations - no recent symptoms - continue telemetry - started on beta blocker yesterday      Carlyle Dolly, M.D., F.A.C.C.

## 2013-04-23 NOTE — Progress Notes (Signed)
PROGRESS NOTE  UCHECHUKWU DHAWAN ACZ:[id number] DOB: [birth year] DOA: 04/22/2013 PCP: Delphina Cahill, MD  Summary: 58 year old man with history of nonobstructive coronary artery disease, diabetes, hypertension, hyperlipidemia presented with chest pain. 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 Associated with generalized weakness, diaphoresis, nausea. No specific aggravating or alleviating factors. No recent similar events. Initial evaluation in the emergency department was reassuring and the patient was pain-free. Admitted for chest pain evaluation.   Assessment/Plan: 1. Chest pain. Resolved. No recurrence. Nuclear stress testing unremarkable. Troponins negative. EKG nonacute. Discussed with Dr. Harl Bowie, cleared for discharge, f/u in 3 weeks with Dr. Domenic Polite or Ms. Lawrence. 2. Diabetes. Stable. 3. Hypertension. Stable. 4. Hyperlipidemia. Continue statin. 5. History of nonobstructive coronary artery disease tobacco dependence.   Discharge home today.  Continue aspirin, Zocor. Continue metoprolol per cardiology for palpitations.  Followup TSH as an outpatient.  Murray Hodgkins, MD  [facility]  Pager [phone] If 7PM-7AM, please contact night-coverage at [email or web address], password [facility] 04/23/2013, 12:31 PM  LOS: 1 day   Consultants:  Cardiology  Procedures:  2-D echocardiogram pending  HPI/Subjective: No problems. No chest pain. Ready to go home.  Objective: Filed Vitals:   04/22/13 1650 04/22/13 2100 04/23/13 0538 04/23/13 1021  BP: 135/69 111/69 116/65 138/84  Pulse: 98 76 74 80  Temp: 97.8 F (36.6 C) 98.4 F (36.9 C) 98.5 F (36.9 C) 97.9 F (36.6 C)  TempSrc: Oral Oral  Oral  Resp:  18 20 20   Height: 5\' 7"  (1.702 m)     Weight: 77.111 kg (170 lb)     SpO2: 96%  94% 95%    Intake/Output Summary (Last 24 hours) at 04/23/13 1231 Last data filed at 04/23/13 0855  Gross per 24 hour  Intake      0 ml  Output      0  ml  Net      0 ml     Filed Weights   04/22/13 1650  Weight: 77.111 kg (170 lb)    Exam:   Afebrile, vital signs stable. No hypoxia.  Gen. Appears calm and comfortable. Speech fluent and clear.  Cardiovascular regular rate and rhythm. No murmur, rub or gallop.  Respiratory clear to auscultation bilaterally. No wheezes, rales or rhonchi. Normal respiratory effort.  Telemetry sinus rhythm.  Data Reviewed:  Basic metabolic panel unremarkable. Magnesium 2.0. Potassium slightly low 3.6.  Negative Lexi scan. Normal LVEF, normal wall motion.  EKG sinus rhythm, no acute changes. Suspect LVH with repolarization  Scheduled Meds: . aspirin EC  325 mg Oral Daily  . enoxaparin (LOVENOX) injection  40 mg Subcutaneous Q24H  . gabapentin  200 mg Oral TID  . lisinopril  20 mg Oral BID   And  . hydrochlorothiazide  12.5 mg Oral BID  . insulin aspart  0-9 Units Subcutaneous TID WC  . metoprolol tartrate  12.5 mg Oral BID  . pantoprazole  40 mg Oral Daily  . regadenoson  0.4 mg Intravenous Once  . simvastatin  40 mg Oral Daily  . sodium chloride  3 mL Intravenous Q12H  . sodium chloride  3 mL Intravenous Q12H   Continuous Infusions:   Principal Problem:   Chest pain Active Problems:   Mixed hyperlipidemia   Tobacco use disorder   Essential hypertension, benign   CORONARY ATHEROSCLEROSIS NATIVE CORONARY ARTERY   CAROTID ARTERY DISEASE   GERD (gastroesophageal reflux disease)   Diabetes   COPD (chronic obstructive pulmonary disease)  Hypokalemia   Palpitations

## 2013-04-23 NOTE — Discharge Summary (Signed)
Physician Discharge Summary  Jimmy Duran [id number] DOB: Nov 07, 1955 DOA: 04/22/2013  PCP: Jimmy Cahill, Duran  Admit date: 04/22/2013 Discharge date: 04/23/2013  Recommendations for Outpatient Follow-up:  1. Chest pain, suspect noncardiac  2. 2-D echocardiogram results pending 3. Followup palpitations. Cardiology recommended starting beta blocker. TSH is pending at this time. 4. Tobacco dependence. Recommend cessation.   Follow-up Information   Follow up with Jimmy Cahill, Duran In 1 week.   Specialty:  Internal Medicine   Contact information:    [place] [address] [phone]       Follow up with Jimmy Lesches, Duran On 04/30/2013. (10 AM)    Specialty:  Cardiology   Contact information:   [place] [address] [phone]      Discharge Diagnoses:  1. Chest pain, likely noncardiac 2. Diabetes mellitus type 2 3. Hypertension 4. Hyperlipidemia 5. History of nonobstructive coronary artery disease 6. Tobacco dependence  Discharge Condition: Improved Disposition: Home  Diet recommendation: Heart healthy diabetic diet  Filed Weights   04/22/13 1650  Weight: 77.111 kg (170 lb)    History of present illness:  58 year old man with history of nonobstructive coronary artery disease, diabetes, hypertension, hyperlipidemia presented with chest pain. 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 Associated with generalized weakness, diaphoresis, nausea. No specific aggravating or alleviating factors. No recent similar events. Initial evaluation in the emergency department was reassuring and the patient was pain-free. Admitted for chest pain evaluation.   Hospital Course:  Mr. Jimmy Duran was observed overnight. Cardiac enzymes were negative. EKG nonacute. Nuclear stress testing was reassuring. Patient was cleared by cardiology for discharge. He had no recurrent chest pain. Favor a noncardiac etiology. He  has outpatient followup arranged with his cardiologist next week.  1. Chest pain. Resolved. No recurrence. Nuclear stress testing unremarkable. Troponins negative. EKG nonacute. Discussed with Jimmy Duran, cleared for discharge. 2. Palpitations. Cardiology recommended starting beta blocker. 3. Diabetes. Stable. 4. Hypertension. Stable. 5. Hyperlipidemia. Continue statin. 6. History of nonobstructive coronary artery disease tobacco dependence. Continue aspirin, Zocor. Continue metoprolol per cardiology for palpitations.  Followup TSH as an outpatient.  Consultants:  Cardiology Procedures:  2-D echocardiogram pending  Discharge Instructions  Discharge Orders   Future Appointments Provider Department Dept Phone   04/30/2013 10:00 AM Jimmy Duran [place] [phone]   Future Orders Complete By Expires   Activity as tolerated - No restrictions  As directed    Diet - low sodium heart healthy  As directed    Diet Carb Modified  As directed    Discharge instructions  As directed    Comments:     Call your physician or seek immediate medical attention for chest pain, shortness of breath or worsening of condition. The cardiologist has recommended a new medication for palpitations.       Medication List         aspirin 325 MG EC tablet  Take 1 tablet (325 mg total) by mouth daily.     diphenhydrAMINE 25 MG tablet  Commonly known as:  BENADRYL  Take 25 mg by mouth daily as needed for allergies.     fish oil-omega-3 fatty acids 1000 MG capsule  Take 2 g by mouth 2 (two) times daily.     gabapentin 100 MG capsule  Commonly known as:  NEURONTIN  Take 2 capsules (200 mg total) by mouth 3 (three) times daily.  lisinopril-hydrochlorothiazide 20-12.5 MG per tablet  Commonly known as:  PRINZIDE,ZESTORETIC  Take 1 tablet by mouth 2 (two) times daily.     metoprolol tartrate 25 MG tablet  Commonly known as:  LOPRESSOR  Take 0.5 tablets (12.5 mg total) by  mouth 2 (two) times daily.     pantoprazole 40 MG tablet  Commonly known as:  PROTONIX  Take 1 tablet (40 mg total) by mouth daily.     simvastatin 40 MG tablet  Commonly known as:  ZOCOR  Take 40 mg by mouth daily.       No Known Allergies  The results of significant diagnostics from this hospitalization (including imaging, microbiology, ancillary and laboratory) are listed below for reference.    Significant Diagnostic Studies: Dg Chest 2 View  04/22/2013   CLINICAL DATA:  Chest pain.  EXAM: CHEST  2 VIEW  COMPARISON:  05/18/2010, 12/03/2006.  FINDINGS: Cardiomediastinal silhouette unremarkable and unchanged. Stable mild hyperinflation and mild central peribronchial thickening. Minimal linear atelectasis in the lingula. Lungs otherwise clear. No localized airspace consolidation. No pleural effusions. No pneumothorax. Normal pulmonary vascularity. Visualized bony thorax intact.  IMPRESSION: Minimal linear atelectasis in the lingula. Stable mild changes of chronic bronchitis and/or asthma. No acute cardiopulmonary disease otherwise.   Electronically Signed   By: Jimmy Duran M.D.   On: 04/22/2013 14:35   Nm Myocar Single W/spect W/wall Motion And Ef  04/23/2013   CLINICAL DATA:  58 year old male with no known history of coronary artery disease referred for chest pain.  EXAM: MYOCARDIAL IMAGING WITH SPECT (REST AND PHARMACOLOGIC-STRESS)  GATED LEFT VENTRICULAR WALL MOTION STUDY  LEFT VENTRICULAR EJECTION FRACTION  TECHNIQUE: Standard myocardial SPECT imaging was performed after resting intravenous injection of 10 mCi Tc-62m sestamibi. Subsequently, intravenous infusion of Lexiscan was performed under the supervision of the Cardiology staff. At peak effect of the drug, 30 mCi Tc-35m sestamibi was injected intravenously and standard myocardial SPECT imaging was performed. Quantitative gated imaging was also performed to evaluate left ventricular wall motion, and estimate left ventricular  ejection fraction.  COMPARISON:  None.  FINDINGS: Pharmacological stress  Baseline EKG showed normal sinus rhythm. After injection, heart rate increased from 85 beats per min up to 104 beats per min and blood pressure decreased from 133/74 down to 113/81. After injection there were no ischemic EKG changes or significant arrhythmias. The test was stopped after injection was completed, the patient did not experience any chest pain.  Myocardial perfusion imaging  Raw images showed appropriate radiotracer uptake. There was a moderate-sized defect in the inferior wall at rest, this defect was less intense in the post-injection images. This area had normal wall motion, overall consistent with sub- diaphragmatic attenuation. There were no other myocardial perfusion defects.  Gated imaging showed end-diastolic volume 78 mL, end systolic volume 35 mL, left ventricular ejection fraction 55%, t.i.d. 0.91.  IMPRESSION: 1.  Negative Lexi scan myocardial perfusion imaging stress test  2. Normal left ventricular ejection fraction with normal wall motion.  3.  Low risk study for major cardiovascular events.   Electronically Signed   By: Carlyle Dolly   On: 04/23/2013 10:46   Labs: Basic Metabolic Panel:  Recent Labs Lab 04/22/13 1359 04/23/13 0209  NA 141 139  K 3.3* 3.6*  CL 100 101  CO2 24 26  GLUCOSE 154* 144*  BUN 14 15  CREATININE 0.74 0.74  CALCIUM 8.9 9.1  MG  --  2.0   CBC:  Recent Labs Lab 04/22/13 1359  WBC 9.6  NEUTROABS 8.2*  HGB 16.0  HCT 45.3  MCV 95.8  PLT 287   Cardiac Enzymes:  Recent Labs Lab 04/22/13 1359 04/22/13 1951 04/23/13 0209  TROPONINI <0.30 <0.30 <0.30   CBG:  Recent Labs Lab 04/22/13 1636 04/22/13 2148 04/23/13 0759  GLUCAP 105* 144* 158*    Principal Problem:   Chest pain Active Problems:   Mixed hyperlipidemia   Tobacco use disorder   Essential hypertension, benign   CORONARY ATHEROSCLEROSIS NATIVE CORONARY ARTERY   CAROTID ARTERY DISEASE    GERD (gastroesophageal reflux disease)   Diabetes   COPD (chronic obstructive pulmonary disease)   Hypokalemia   Palpitations   Time coordinating discharge: 25 minutes  Signed:  Murray Hodgkins, Duran [facility] 04/23/2013, 12:47 PM

## 2013-04-30 ENCOUNTER — Ambulatory Visit (INDEPENDENT_AMBULATORY_CARE_PROVIDER_SITE_OTHER): Payer: BC Managed Care – PPO | Admitting: Cardiology

## 2013-04-30 ENCOUNTER — Encounter: Payer: Self-pay | Admitting: Cardiology

## 2013-04-30 VITALS — BP 142/80 | HR 80 | Ht 67.0 in | Wt 168.0 lb

## 2013-04-30 DIAGNOSIS — I10 Essential (primary) hypertension: Secondary | ICD-10-CM

## 2013-04-30 DIAGNOSIS — I739 Peripheral vascular disease, unspecified: Secondary | ICD-10-CM

## 2013-04-30 DIAGNOSIS — I779 Disorder of arteries and arterioles, unspecified: Secondary | ICD-10-CM

## 2013-04-30 DIAGNOSIS — E782 Mixed hyperlipidemia: Secondary | ICD-10-CM

## 2013-04-30 DIAGNOSIS — I2510 Atherosclerotic heart disease of native coronary artery without angina pectoris: Secondary | ICD-10-CM

## 2013-04-30 DIAGNOSIS — F17200 Nicotine dependence, unspecified, uncomplicated: Secondary | ICD-10-CM

## 2013-04-30 NOTE — Assessment & Plan Note (Signed)
Blood pressure mildly elevated today. Continue current regimen. Keep follow up with Dr. Nevada Crane.

## 2013-04-30 NOTE — Progress Notes (Signed)
Clinical Summary Jimmy Duran is a 58 y.o.male last seen in April 2014. Record review finds recent hospitalization at [facility] with chest pain. He was seen in consultation by Dr. Harl Bowie, ruled out for myocardial nfarction. Lexiscan Cardiolite was negative for scar or ischemia, LVEF 55%.  Recent echocardiogram showed LVEF 60-65% with moderate LVH, normal diastolic function, no major valvular abnormalities.  Lab work this February showed cholesterol 219, triglycerides 306, HDL 34, LDL 124. He is due for carotid Dopplers. He ran out of Zocor and needs refill.  Today he is here with his wife. States he feels better since hospital discharge. We reviewed his recent testing and discussed plan to continue medications and observation.   No Known Allergies  Current Outpatient Prescriptions  Medication Sig Dispense Refill  . aspirin EC 325 MG EC tablet Take 1 tablet (325 mg total) by mouth daily.      . diphenhydrAMINE (BENADRYL) 25 MG tablet Take 25 mg by mouth daily as needed for allergies.       . fish oil-omega-3 fatty acids 1000 MG capsule Take 2 g by mouth 2 (two) times daily.       Marland Kitchen gabapentin (NEURONTIN) 100 MG capsule Take 2 capsules (200 mg total) by mouth 3 (three) times daily.  90 capsule  2  . lisinopril-hydrochlorothiazide (PRINZIDE,ZESTORETIC) 20-12.5 MG per tablet Take 1 tablet by mouth 2 (two) times daily.  60 tablet  11  . metoprolol tartrate (LOPRESSOR) 25 MG tablet Take 0.5 tablets (12.5 mg total) by mouth 2 (two) times daily.  30 tablet  0  . pantoprazole (PROTONIX) 40 MG tablet Take 1 tablet (40 mg total) by mouth daily.  30 tablet  11  . simvastatin (ZOCOR) 40 MG tablet Take 40 mg by mouth daily.        No current facility-administered medications for this visit.    Past Medical History  Diagnosis Date  . Coronary artery disease     Nonobstructive  . Type 2 diabetes mellitus   . Mixed hyperlipidemia   . Essential hypertension, benign   . Carotid artery disease   .  COPD (chronic obstructive pulmonary disease)   . GERD (gastroesophageal reflux disease)   . Hemorrhoids   . Diabetes mellitus   . Stroke     "ministrokes"  . Arthritis   . Colon polyp     Social History Jimmy Duran reports that he has been smoking Cigarettes.  He has a 15 pack-year smoking history. His smokeless tobacco use includes Chew. Jimmy Duran reports that he drinks alcohol.  Review of Systems No palpitations or syncope. No bleeding problems. No motor weakness or speech deficits. Otherwise negative.  Physical Examination Filed Vitals:   04/30/13 1002  BP: 142/80  Pulse: 80   Filed Weights   04/30/13 1002  Weight: 168 lb (76.204 kg)   Appears comfortable at rest. HEENT: Conjunctiva and lids normal, oropharynx with poor dentition.  Neck: Supple, no elevated JVP or significant carotid bruits, no thyromegaly.  Lungs: Diminished breath sounds throughout, no wheezing, nonlabored.  Cardiac: Regular rate and rhythm, no S3 or significant systolic murmur.  Abdomen: Soft, nontender, bowel sounds present.  Skin: Warm and dry.  Musculoskeletal: No kyphosis.  Extremities: No pitting edema, distal pulses 1-2+. Neuropsychiatric: Alert and oriented x3, affect appropriate.    Problem List and Plan   CORONARY ATHEROSCLEROSIS NATIVE CORONARY ARTERY History of nonobstructive disease, recent cardiac testing did not demonstrate any evidence of ischemia and LVEF remains normal. Would  continue medical therapy and observation. Followup arranged in 6 months.  CAROTID ARTERY DISEASE Mild to moderate disease, most significant on the left by previous testing in 2013. Followup carotid Dopplers to be obtained.   Essential hypertension, benign Blood pressure mildly elevated today. Continue current regimen. Keep follow up with Dr. Nevada Crane.  Tobacco use disorder Smoking cessation discussed. He has not been able to quit.  Mixed hyperlipidemia Recent LDL not at goal, he had run out of Zocor. This  was refilled.    Satira Sark, M.D., F.A.C.C.

## 2013-04-30 NOTE — Assessment & Plan Note (Signed)
Mild to moderate disease, most significant on the left by previous testing in 2013. Followup carotid Dopplers to be obtained.

## 2013-04-30 NOTE — Assessment & Plan Note (Signed)
History of nonobstructive disease, recent cardiac testing did not demonstrate any evidence of ischemia and LVEF remains normal. Would continue medical therapy and observation. Followup arranged in 6 months.

## 2013-04-30 NOTE — Assessment & Plan Note (Signed)
Recent LDL not at goal, he had run out of Zocor. This was refilled.

## 2013-04-30 NOTE — Assessment & Plan Note (Signed)
Smoking cessation discussed. He has not been able to quit.

## 2013-04-30 NOTE — Patient Instructions (Signed)
Your physician wants you to follow-up in: 6 months You will receive a reminder letter in the mail two months in advance. If you don't receive a letter, please call our office to schedule the follow-up appointment.   Your physician recommends that you continue on your current medications as directed. Please refer to the Current Medication list given to you today.   Your physician has requested that you have a carotid duplex. This test is an ultrasound of the carotid arteries in your neck. It looks at blood flow through these arteries that supply the brain with blood. Allow one hour for this exam. There are no restrictions or special instructions.       Thank you for choosing [facility] !        

## 2013-05-05 ENCOUNTER — Ambulatory Visit (HOSPITAL_COMMUNITY)
Admission: RE | Admit: 2013-05-05 | Discharge: 2013-05-05 | Disposition: A | Discharge status: Home / Self Care | Payer: BC Managed Care – PPO | Source: Ambulatory Visit | Attending: Cardiology | Admitting: Cardiology

## 2013-05-05 ENCOUNTER — Telehealth: Payer: Self-pay | Admitting: *Deleted

## 2013-05-05 DIAGNOSIS — I658 Occlusion and stenosis of other precerebral arteries: Secondary | ICD-10-CM | POA: Insufficient documentation

## 2013-05-05 DIAGNOSIS — I779 Disorder of arteries and arterioles, unspecified: Secondary | ICD-10-CM

## 2013-05-05 DIAGNOSIS — I739 Peripheral vascular disease, unspecified: Secondary | ICD-10-CM

## 2013-05-05 DIAGNOSIS — I6529 Occlusion and stenosis of unspecified carotid artery: Secondary | ICD-10-CM | POA: Insufficient documentation

## 2013-05-05 DIAGNOSIS — Z8673 Personal history of transient ischemic attack (TIA), and cerebral infarction without residual deficits: Secondary | ICD-10-CM | POA: Insufficient documentation

## 2013-05-05 DIAGNOSIS — I10 Essential (primary) hypertension: Secondary | ICD-10-CM | POA: Insufficient documentation

## 2013-05-05 DIAGNOSIS — E785 Hyperlipidemia, unspecified: Secondary | ICD-10-CM | POA: Insufficient documentation

## 2013-05-05 MED ORDER — SIMVASTATIN 40 MG PO TABS: 40.0000 mg | ORAL_TABLET | Freq: Every evening | ORAL | Status: DC

## 2013-05-05 NOTE — Telephone Encounter (Signed)
Pt was told simvastatin was called in to [facility] in [place] but [facility] states they do not have it.

## 2013-05-05 NOTE — Telephone Encounter (Signed)
RX sent to pharmacy as requested. Patient notified via voice mail.

## 2013-05-06 ENCOUNTER — Telehealth: Payer: Self-pay

## 2013-05-06 NOTE — Telephone Encounter (Signed)
Message copied by Bernita Raisin on Thu May 06, 2013 10:06 AM ------      Message from: MCDOWELL, Aloha Gell      Created: Wed May 05, 2013  2:40 PM       Reviewed. 08-67% LICA stenosis and less than 50% RICA stenosis. Still in a range that we can follow on medical therapy. Arrange repeat testing for one year. ------

## 2013-05-06 NOTE — Telephone Encounter (Signed)
Spoke with wife as she reports husband at work, results discussed,forwarded to [employer]

## 2013-07-20 ENCOUNTER — Other Ambulatory Visit: Payer: Self-pay | Admitting: *Deleted

## 2013-07-20 MED ORDER — PANTOPRAZOLE SODIUM 40 MG PO TBEC: 40.0000 mg | DELAYED_RELEASE_TABLET | Freq: Every day | ORAL | Status: DC

## 2013-10-18 ENCOUNTER — Emergency Department (HOSPITAL_COMMUNITY)
Admission: EM | Admit: 2013-10-18 | Discharge: 2013-10-18 | Disposition: A | Discharge status: Home / Self Care | Payer: BC Managed Care – PPO | Attending: Emergency Medicine | Admitting: Emergency Medicine

## 2013-10-18 ENCOUNTER — Telehealth: Payer: Self-pay | Admitting: *Deleted

## 2013-10-18 ENCOUNTER — Encounter (HOSPITAL_COMMUNITY): Payer: Self-pay | Admitting: Emergency Medicine

## 2013-10-18 DIAGNOSIS — I2510 Atherosclerotic heart disease of native coronary artery without angina pectoris: Secondary | ICD-10-CM | POA: Insufficient documentation

## 2013-10-18 DIAGNOSIS — Z79899 Other long term (current) drug therapy: Secondary | ICD-10-CM | POA: Insufficient documentation

## 2013-10-18 DIAGNOSIS — F17200 Nicotine dependence, unspecified, uncomplicated: Secondary | ICD-10-CM | POA: Insufficient documentation

## 2013-10-18 DIAGNOSIS — R002 Palpitations: Secondary | ICD-10-CM | POA: Insufficient documentation

## 2013-10-18 DIAGNOSIS — Z7982 Long term (current) use of aspirin: Secondary | ICD-10-CM | POA: Insufficient documentation

## 2013-10-18 DIAGNOSIS — M129 Arthropathy, unspecified: Secondary | ICD-10-CM | POA: Insufficient documentation

## 2013-10-18 DIAGNOSIS — I10 Essential (primary) hypertension: Secondary | ICD-10-CM | POA: Diagnosis not present

## 2013-10-18 DIAGNOSIS — Z8673 Personal history of transient ischemic attack (TIA), and cerebral infarction without residual deficits: Secondary | ICD-10-CM | POA: Diagnosis not present

## 2013-10-18 DIAGNOSIS — E119 Type 2 diabetes mellitus without complications: Secondary | ICD-10-CM | POA: Insufficient documentation

## 2013-10-18 DIAGNOSIS — E782 Mixed hyperlipidemia: Secondary | ICD-10-CM | POA: Diagnosis not present

## 2013-10-18 DIAGNOSIS — J449 Chronic obstructive pulmonary disease, unspecified: Secondary | ICD-10-CM | POA: Insufficient documentation

## 2013-10-18 DIAGNOSIS — K219 Gastro-esophageal reflux disease without esophagitis: Secondary | ICD-10-CM | POA: Diagnosis not present

## 2013-10-18 DIAGNOSIS — Z860100 Personal history of colon polyps, unspecified: Secondary | ICD-10-CM | POA: Insufficient documentation

## 2013-10-18 DIAGNOSIS — J4489 Other specified chronic obstructive pulmonary disease: Secondary | ICD-10-CM | POA: Insufficient documentation

## 2013-10-18 LAB — BASIC METABOLIC PANEL
Anion gap: 17 — ABNORMAL HIGH (ref 5–15)
BUN: 19 mg/dL (ref 6–23)
CO2: 21 mEq/L (ref 19–32)
Calcium: 9.7 mg/dL (ref 8.4–10.5)
Chloride: 102 mEq/L (ref 96–112)
Creatinine, Ser: 0.92 mg/dL (ref 0.50–1.35)
GFR calc Af Amer: >90 mL/min (ref >90)
GFR calc non Af Amer: >90 mL/min (ref >90)
Glucose, Bld: 162 mg/dL — ABNORMAL HIGH (ref 70–99)
Potassium: 4.1 mEq/L (ref 3.7–5.3)
Sodium: 140 mEq/L (ref 137–147)

## 2013-10-18 LAB — CBC WITH DIFFERENTIAL/PLATELET
Basophils Absolute: 0.1 10*3/uL (ref 0.0–0.1)
Basophils Relative: 2 % — ABNORMAL HIGH (ref 0–1)
Eosinophils Absolute: 0.4 10*3/uL (ref 0.0–0.7)
Eosinophils Relative: 5 % (ref 0–5)
HCT: 42.2 % (ref 39.0–52.0)
Hemoglobin: 14.9 g/dL (ref 13.0–17.0)
Lymphocytes Relative: 29 % (ref 12–46)
Lymphs Abs: 2.4 10*3/uL (ref 0.7–4.0)
MCH: 33.9 pg (ref 26.0–34.0)
MCHC: 35.3 g/dL (ref 30.0–36.0)
MCV: 95.9 fL (ref 78.0–100.0)
Monocytes Absolute: 0.7 10*3/uL (ref 0.1–1.0)
Monocytes Relative: 9 % (ref 3–12)
Neutro Abs: 4.4 10*3/uL (ref 1.7–7.7)
Neutrophils Relative %: 55 % (ref 43–77)
Platelets: 309 10*3/uL (ref 150–400)
RBC: 4.4 MIL/uL (ref 4.22–5.81)
RDW: 12.7 % (ref 11.5–15.5)
WBC: 8 10*3/uL (ref 4.0–10.5)

## 2013-10-18 LAB — TROPONIN I: Troponin I: <0.3 ng/mL (ref <0.30)

## 2013-10-18 LAB — MAGNESIUM: Magnesium: 2.1 mg/dL (ref 1.5–2.5)

## 2013-10-18 MED ORDER — METOPROLOL TARTRATE 12.5 MG HALF TABLET: 12.5000 mg | ORAL_TABLET | Freq: Two times a day (BID) | ORAL | Status: DC

## 2013-10-18 MED ORDER — SODIUM CHLORIDE 0.9 % IV BOLUS (SEPSIS)
1000.0000 mL | Freq: Once | INTRAVENOUS | Status: AC
  Administered 2013-10-18: 1000 mL via INTRAVENOUS

## 2013-10-18 NOTE — Telephone Encounter (Signed)
Pt advised to go to ED for chest pressure, skipped beats, dizzy/lightheadeness. Pt did have transportation

## 2013-10-18 NOTE — ED Provider Notes (Signed)
TIME SEEN: 4:28 PM  CHIEF COMPLAINT: Palpitations, lightheadedness  HPI: Patient is a 58 year old male with history of nonobstructive coronary artery disease, diabetes, hypertension, hyperlipidemia, COPD with continued tobacco use, prior TIAs who presents to the emergency department with complaints of palpitations and lightheadedness. He states he's had this problem intermittently for many years. He states he was seen in February 2015) on the metoprolol which significantly helped his symptoms. He states when he ran out of that medication he started having palpitations again. He states that since Saturday, 2 days ago his symptoms were worse and he was feeling lightheaded with the palpitations. No chest pain or chest discomfort. No shortness of breath. No vomiting or diarrhea. No bloody stool or melena. No fever. No history of PE or DVT. No lower chin he swelling or pain. Denies stimulant use but does drink one caffeinated beverage a day. No illicit drug use. States his friend had a blood pressure cuff and heart monitor that he used 2 days ago and his heart rate was 117, regular while at rest.  His cardiologist is Dr. Domenic Polite  ROS: See HPI Constitutional: no fever  Eyes: no drainage  ENT: no runny nose   Cardiovascular:  no chest pain  Resp: no SOB  GI: no vomiting GU: no dysuria Integumentary: no rash  Allergy: no hives  Musculoskeletal: no leg swelling  Neurological: no slurred speech ROS otherwise negative  PAST MEDICAL HISTORY/PAST SURGICAL HISTORY:  Past Medical History  Diagnosis Date  . Coronary artery disease     Nonobstructive  . Type 2 diabetes mellitus   . Mixed hyperlipidemia   . Essential hypertension, benign   . Carotid artery disease   . COPD (chronic obstructive pulmonary disease)   . GERD (gastroesophageal reflux disease)   . Hemorrhoids   . Diabetes mellitus   . Stroke     "ministrokes"  . Arthritis   . Colon polyp     MEDICATIONS:  Prior to Admission  medications   Medication Sig Start Date End Date Taking? Authorizing Provider  aspirin EC 325 MG EC tablet Take 1 tablet (325 mg total) by mouth daily. 04/30/12   Carole Civil, MD  diphenhydrAMINE (BENADRYL) 25 MG tablet Take 25 mg by mouth daily as needed for allergies.     Historical Provider, MD  fish oil-omega-3 fatty acids 1000 MG capsule Take 2 g by mouth 2 (two) times daily.     Historical Provider, MD  gabapentin (NEURONTIN) 100 MG capsule Take 2 capsules (200 mg total) by mouth 3 (three) times daily. 07/23/12   Carole Civil, MD  lisinopril-hydrochlorothiazide (PRINZIDE,ZESTORETIC) 20-12.5 MG per tablet Take 1 tablet by mouth 2 (two) times daily. 12/11/12   Satira Sark, MD  pantoprazole (PROTONIX) 40 MG tablet Take 1 tablet (40 mg total) by mouth daily. 07/20/13   Orvil Feil, NP  simvastatin (ZOCOR) 40 MG tablet Take 1 tablet (40 mg total) by mouth every evening. 05/05/13   Satira Sark, MD    ALLERGIES:  No Known Allergies  SOCIAL HISTORY:  History  Substance Use Topics  . Smoking status: Current Every Day Smoker -- 0.50 packs/day for 30 years    Types: Cigarettes  . Smokeless tobacco: Current User    Types: Chew  . Alcohol Use: Yes     Comment: Socially    FAMILY HISTORY: Family History  Problem Relation Age of Onset  . Coronary artery disease Father     CABG in his 89's  .  Colon cancer Other     maternal great aunt  . Colon cancer Other     paternal great aunt  . Colon polyps Mother     EXAM: BP 138/44  Pulse 89  Temp(Src) 98.2 F (36.8 C) (Oral)  Resp 23  Ht 5\' 7"  (1.702 m)  Wt 170 lb (77.111 kg)  BMI 26.62 kg/m2  SpO2 98% CONSTITUTIONAL: Alert and oriented and responds appropriately to questions. Well-appearing; well-nourished HEAD: Normocephalic EYES: Conjunctivae clear, PERRL ENT: normal nose; no rhinorrhea; moist mucous membranes; pharynx without lesions noted NECK: Supple, no meningismus, no LAD  CARD: RRR; S1 and S2 appreciated;  no murmurs, no clicks, no rubs, no gallops RESP: Normal chest excursion without splinting or tachypnea; breath sounds clear and equal bilaterally; no wheezes, no rhonchi, no rales,  ABD/GI: Normal bowel sounds; non-distended; soft, non-tender, no rebound, no guarding BACK:  The back appears normal and is non-tender to palpation, there is no CVA tenderness EXT: Normal ROM in all joints; non-tender to palpation; no edema; normal capillary refill; no cyanosis    SKIN: Normal color for age and race; warm NEURO: Moves all extremities equally PSYCH: The patient's mood and manner are appropriate. Grooming and personal hygiene are appropriate.  MEDICAL DECISION MAKING: Patient here with palpitations and lightheadedness. No chest pain or shortness of breath. Symptoms have been present intermittently for several years. He is hemodynamically stable currently. Heart rate in the upper 90s. Blood pressure 130s/70s. No complaints of pain or shortness of breath. Doubt pulmonary embolus or CAD. He is in normal sinus rhythm currently. No history of A. fib or other arrhythmia. No delta wave, WPW, LVH, interval changes on his EKG. We'll check CBC to evaluate for possible anemia, BMP and magnesium to check electrolytes, TSH. We'll give IV fluids. Anticipate if workup is negative, patient will be discharged home with prescription for manubrial and close cardiology followup. He is comfortable with this plan.  ED PROGRESS: Patient reports feeling better after IV fluids. Labs are unremarkable. Normal hemoglobin. Normal electrolytes. Troponin negative. We'll discharge him with prescription for metoprolol. Patient was seen in the hospital in February 2015 and cardiology recommended starting metoprolol 12.5 mg twice daily at that time. We'll have him followup with his cardiologist. Discussed return precautions and supportive care instructions. He verbalizes understanding and is comfortable with plan.    Date: 10/18/2013 16:04   Rate: 84  Rhythm: normal sinus rhythm  QRS Axis: normal  Intervals: normal  ST/T Wave abnormalities: normal  Conduction Disutrbances: none  Narrative Interpretation: unremarkable; no arrhythmia, no ischemic changes       Albert, DO 10/18/13 1808

## 2013-10-18 NOTE — ED Notes (Signed)
Felt bad since Saturday.  C/o  Heart racing and feeling light headed.  Has seen Dr. Domenic Polite for the same in past and was given a medication that helped.  He has ran out of the medication and now his palpitations are back.

## 2013-10-18 NOTE — Telephone Encounter (Signed)
Pt has been feeling lightheaded, dizzy and has chest pressure since yesterday,

## 2013-10-18 NOTE — Discharge Instructions (Signed)

## 2013-10-18 NOTE — ED Notes (Signed)
Patient with no complaints at this time. Respirations even and unlabored. Skin warm/dry. Discharge instructions reviewed with patient at this time. Patient given opportunity to voice concerns/ask questions. IV removed per policy and band-aid applied to site. Patient discharged at this time and left Emergency Department with steady gait.  

## 2013-10-19 LAB — TSH: TSH: 1.79 u[IU]/mL (ref 0.350–4.500)

## 2013-12-09 ENCOUNTER — Ambulatory Visit (INDEPENDENT_AMBULATORY_CARE_PROVIDER_SITE_OTHER): Payer: BC Managed Care – PPO | Admitting: Cardiology

## 2013-12-09 ENCOUNTER — Encounter: Payer: Self-pay | Admitting: Cardiology

## 2013-12-09 VITALS — BP 166/86 | HR 84 | Ht 67.0 in | Wt 167.0 lb

## 2013-12-09 DIAGNOSIS — I10 Essential (primary) hypertension: Secondary | ICD-10-CM

## 2013-12-09 DIAGNOSIS — E782 Mixed hyperlipidemia: Secondary | ICD-10-CM

## 2013-12-09 DIAGNOSIS — I2510 Atherosclerotic heart disease of native coronary artery without angina pectoris: Secondary | ICD-10-CM

## 2013-12-09 MED ORDER — METOPROLOL TARTRATE 12.5 MG HALF TABLET: 12.5000 mg | ORAL_TABLET | Freq: Two times a day (BID) | ORAL | Status: DC

## 2013-12-09 MED ORDER — SIMVASTATIN 40 MG PO TABS: 40.0000 mg | ORAL_TABLET | Freq: Every morning | ORAL | Status: DC

## 2013-12-09 MED ORDER — LISINOPRIL-HYDROCHLOROTHIAZIDE 20-12.5 MG PO TABS: 1.0000 | ORAL_TABLET | Freq: Every morning | ORAL | Status: DC

## 2013-12-09 NOTE — Addendum Note (Signed)
Addended by: Barbarann Ehlers A on: 12/09/2013 03:59 PM   Modules accepted: Orders

## 2013-12-09 NOTE — Assessment & Plan Note (Signed)
Continues on Zocor. Followup lab work pending.

## 2013-12-09 NOTE — Patient Instructions (Signed)
Your physician wants you to follow-up in: 6 months You will receive a reminder letter in the mail two months in advance. If you don't receive a letter, please call our office to schedule the follow-up appointment.     Your physician recommends that you continue on your current medications as directed. Please refer to the Current Medication list given to you today.      Thank you for choosing [facility] !        

## 2013-12-09 NOTE — Progress Notes (Signed)
    Clinical Summary Mr. Mancil is a 58 y.o.male last seen in February. Record review finds interval evaluation in the ER in August with palpitations. Workup was unremarkable at that time. TSH 1.7, hemoglobin 14.9, platelets 309, potassium 4.1, BUN 19, creatinine 0.9, magnesium 2.1, troponin I less than 0.30.Marland Kitchen ECG showed sinus rhythm. He was started on low-dose Lopressor which had been recommended on prior evaluations.  He presents today stating that he feels much better, rare palpitations, no chest pain. He reports compliance with Lopressor. Followup lab work is pending.  Lexiscan Cardiolite from February of this year was negative for scar or ischemia, LVEF 55%.  No Known Allergies  Current Outpatient Prescriptions  Medication Sig Dispense Refill  . aspirin EC 325 MG tablet Take 325 mg by mouth every morning.      . fish oil-omega-3 fatty acids 1000 MG capsule Take 2 g by mouth 2 (two) times daily.       Marland Kitchen gabapentin (NEURONTIN) 100 MG capsule Take 2 capsules (200 mg total) by mouth 3 (three) times daily.  90 capsule  2  . lisinopril-hydrochlorothiazide (PRINZIDE,ZESTORETIC) 20-12.5 MG per tablet Take 1 tablet by mouth every morning.  90 tablet  3  . metoprolol tartrate (LOPRESSOR) 12.5 mg TABS tablet Take 0.5 tablets (12.5 mg total) by mouth 2 (two) times daily.  90 tablet  3  . pantoprazole (PROTONIX) 40 MG tablet Take 40 mg by mouth every morning.      . simvastatin (ZOCOR) 40 MG tablet Take 1 tablet (40 mg total) by mouth every morning.  90 tablet  3   No current facility-administered medications for this visit.    Past Medical History  Diagnosis Date  . Coronary artery disease     Nonobstructive  . Type 2 diabetes mellitus   . Mixed hyperlipidemia   . Essential hypertension, benign   . Carotid artery disease   . COPD (chronic obstructive pulmonary disease)   . GERD (gastroesophageal reflux disease)   . Hemorrhoids   . Diabetes mellitus   . Stroke     "ministrokes"  .  Arthritis   . Colon polyp     Social History Mr. Buer reports that he has been smoking Cigarettes.  He has a 15 pack-year smoking history. His smokeless tobacco use includes Chew. Mr. Arndt reports that he drinks alcohol.  Review of Systems Other systems reviewed and negative.  Physical Examination Filed Vitals:   12/09/13 1533  BP: 166/86  Pulse: 84   Filed Weights   12/09/13 1533  Weight: 167 lb (75.751 kg)    Appears comfortable at rest.  HEENT: Conjunctiva and lids normal, oropharynx with poor dentition.  Neck: Supple, no elevated JVP or significant carotid bruits, no thyromegaly.  Lungs: Diminished breath sounds throughout, no wheezing, nonlabored.  Cardiac: Regular rate and rhythm, no S3 or significant systolic murmur.  Abdomen: Soft, nontender, bowel sounds present.  Skin: Warm and dry.    Problem List and Plan   CORONARY ATHEROSCLEROSIS NATIVE CORONARY ARTERY Symptomatically stable on medical therapy. Continue current regimen including low-dose beta blocker. Lopressor could be advanced further if needed. Keep followup in 6 months.  Essential hypertension, benign Keep follow up with Dr. Nevada Crane.  Mixed hyperlipidemia Continues on Zocor. Followup lab work pending.    Satira Sark, M.D., F.A.C.C.

## 2013-12-09 NOTE — Assessment & Plan Note (Signed)
Keep followup with Dr. Hall. 

## 2013-12-09 NOTE — Assessment & Plan Note (Signed)
Symptomatically stable on medical therapy. Continue current regimen including low-dose beta blocker. Lopressor could be advanced further if needed. Keep followup in 6 months.

## 2013-12-21 ENCOUNTER — Encounter: Payer: Self-pay | Admitting: Cardiology

## 2013-12-21 ENCOUNTER — Telehealth: Payer: Self-pay | Admitting: *Deleted

## 2013-12-21 NOTE — Telephone Encounter (Signed)
Received labs from Dr. Nevada Crane in Dr. Domenic Polite folder

## 2013-12-25 ENCOUNTER — Encounter (HOSPITAL_COMMUNITY): Payer: Self-pay | Admitting: Emergency Medicine

## 2013-12-25 ENCOUNTER — Emergency Department (HOSPITAL_COMMUNITY)
Admission: EM | Admit: 2013-12-25 | Discharge: 2013-12-25 | Disposition: A | Discharge status: Home / Self Care | Payer: BC Managed Care – PPO | Attending: Emergency Medicine | Admitting: Emergency Medicine

## 2013-12-25 DIAGNOSIS — Z8673 Personal history of transient ischemic attack (TIA), and cerebral infarction without residual deficits: Secondary | ICD-10-CM | POA: Insufficient documentation

## 2013-12-25 DIAGNOSIS — Z720 Tobacco use: Secondary | ICD-10-CM | POA: Diagnosis not present

## 2013-12-25 DIAGNOSIS — E782 Mixed hyperlipidemia: Secondary | ICD-10-CM | POA: Diagnosis not present

## 2013-12-25 DIAGNOSIS — I2510 Atherosclerotic heart disease of native coronary artery without angina pectoris: Secondary | ICD-10-CM | POA: Insufficient documentation

## 2013-12-25 DIAGNOSIS — Y848 Other medical procedures as the cause of abnormal reaction of the patient, or of later complication, without mention of misadventure at the time of the procedure: Secondary | ICD-10-CM | POA: Diagnosis not present

## 2013-12-25 DIAGNOSIS — J449 Chronic obstructive pulmonary disease, unspecified: Secondary | ICD-10-CM | POA: Diagnosis not present

## 2013-12-25 DIAGNOSIS — S4992XA Unspecified injury of left shoulder and upper arm, initial encounter: Secondary | ICD-10-CM | POA: Insufficient documentation

## 2013-12-25 DIAGNOSIS — R2232 Localized swelling, mass and lump, left upper limb: Secondary | ICD-10-CM | POA: Diagnosis present

## 2013-12-25 DIAGNOSIS — I10 Essential (primary) hypertension: Secondary | ICD-10-CM | POA: Diagnosis not present

## 2013-12-25 DIAGNOSIS — T1490XA Injury, unspecified, initial encounter: Secondary | ICD-10-CM

## 2013-12-25 DIAGNOSIS — E119 Type 2 diabetes mellitus without complications: Secondary | ICD-10-CM | POA: Insufficient documentation

## 2013-12-25 DIAGNOSIS — M1990 Unspecified osteoarthritis, unspecified site: Secondary | ICD-10-CM | POA: Insufficient documentation

## 2013-12-25 DIAGNOSIS — Z7982 Long term (current) use of aspirin: Secondary | ICD-10-CM | POA: Insufficient documentation

## 2013-12-25 DIAGNOSIS — Z86010 Personal history of colonic polyps: Secondary | ICD-10-CM | POA: Insufficient documentation

## 2013-12-25 DIAGNOSIS — K219 Gastro-esophageal reflux disease without esophagitis: Secondary | ICD-10-CM | POA: Insufficient documentation

## 2013-12-25 DIAGNOSIS — T881XXA Other complications following immunization, not elsewhere classified, initial encounter: Secondary | ICD-10-CM | POA: Diagnosis not present

## 2013-12-25 DIAGNOSIS — Z9889 Other specified postprocedural states: Secondary | ICD-10-CM | POA: Diagnosis not present

## 2013-12-25 MED ORDER — HYDROXYZINE HCL 25 MG PO TABS
25.0000 mg | ORAL_TABLET | Freq: Once | ORAL | Status: AC
  Administered 2013-12-25: 25 mg via ORAL
  Filled 2013-12-25: qty 1

## 2013-12-25 MED ORDER — HYDROCODONE-ACETAMINOPHEN 5-325 MG PO TABS: 1.0000 | ORAL_TABLET | ORAL | Status: DC | PRN

## 2013-12-25 MED ORDER — DEXAMETHASONE SODIUM PHOSPHATE 4 MG/ML IJ SOLN
8.0000 mg | Freq: Once | INTRAMUSCULAR | Status: AC
  Administered 2013-12-25: 8 mg via INTRAMUSCULAR
  Filled 2013-12-25: qty 2

## 2013-12-25 MED ORDER — HYDROXYZINE HCL 25 MG PO TABS: ORAL_TABLET | ORAL | Status: DC

## 2013-12-25 MED ORDER — HYDROCODONE-ACETAMINOPHEN 5-325 MG PO TABS
1.0000 | ORAL_TABLET | Freq: Once | ORAL | Status: AC
  Administered 2013-12-25: 1 via ORAL
  Filled 2013-12-25: qty 1

## 2013-12-25 NOTE — ED Provider Notes (Signed)
CSN: [id number]     Arrival date & time 12/25/13  2025 History   First MD Initiated Contact with Patient 12/25/13 2135     Chief Complaint  Patient presents with  . Arm Swelling     (Consider location/radiation/quality/duration/timing/severity/associated sxs/prior Treatment) HPI Comments: Patient is a 58 year old male who presents to the emergency department with a complaint of swelling of the left arm. The patient states that on yesterday October 23 he received a quotation marks pneumonia shot" in the left upper arm. The patient states that later during the night he had pain and swelling. 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 The patient states that the pain continued to get worse during the day, so the patient decided to come to the emergency department tonight to have this evaluated. It is of note that the patient is a diabetic. The patient states he has had pneumonia shots in the past, but they have never affected him in this way. He has not had fever or chills related to this. He does have pain and soreness when using the left arm in certain positions.  The history is provided by the patient.    Past Medical History  Diagnosis Date  . Coronary artery disease     Nonobstructive  . Type 2 diabetes mellitus   . Mixed hyperlipidemia   . Essential hypertension, benign   . Carotid artery disease   . COPD (chronic obstructive pulmonary disease)   . GERD (gastroesophageal reflux disease)   . Hemorrhoids   . Diabetes mellitus   . Stroke     "ministrokes"  . Arthritis   . Colon polyp    Past Surgical History  Procedure Laterality Date  . Appendectomy  1970  . Cholecystectomy  1993  . Umbilical hernia repair  1993  . Breast surgery      Cyst resection on the right  . Knee surgery      Left knee arthroscopy torn medial meniscus grade 4 chondral changes medial femoral condyle tibial plateau  . Total knee arthroplasty Left 04/27/2012     Procedure: TOTAL KNEE ARTHROPLASTY;  Surgeon: Carole Civil, MD;  Location: [facility];  Service: Orthopedics;  Laterality: Left;  . Colonoscopy  02/12/2007    EXH:BZJIRCVEL friable anal canal hemorrhoids, otherwise normal rectum and colon  . Colonoscopy N/A 07/16/2012    Procedure: COLONOSCOPY;  Surgeon: [name], MD;  Location: [facility];  Service: Endoscopy;  Laterality: N/A;  1:15-moved to [place] to notify pt  . Esophagogastroduodenoscopy (egd) with esophageal dilation N/A 07/16/2012    Procedure: ESOPHAGOGASTRODUODENOSCOPY (EGD) WITH ESOPHAGEAL DILATION;  Surgeon: [name], MD;  Location: [facility];  Service: Endoscopy;  Laterality: N/A;   Family History  Problem Relation Age of Onset  . Coronary artery disease Father     CABG in his 98's  . Colon cancer Other     maternal great aunt  . Colon cancer Other     paternal great aunt  . Colon polyps Mother    History  Substance Use Topics  . Smoking status: Current Every Day Smoker -- 0.50 packs/day for 30 years    Types: Cigarettes  . Smokeless tobacco: Current User    Types: Chew     Comment: vapor cig. 4-5 ciggs per day  . Alcohol Use: Yes     Comment: Socially    Review of Systems  Constitutional: Negative for activity change.  All ROS Neg except as noted in HPI  Eyes: Negative for photophobia and discharge.  Respiratory: Negative for cough, shortness of breath and wheezing.   Cardiovascular: Positive for chest pain. Negative for palpitations.  Gastrointestinal: Negative for abdominal pain and blood in stool.  Genitourinary: Negative for dysuria, frequency and hematuria.  Musculoskeletal: Positive for arthralgias. Negative for back pain and neck pain.  Skin: Negative.   Neurological: Negative for dizziness, seizures and speech difficulty.  Psychiatric/Behavioral: Negative for hallucinations and confusion.      Allergies  Review of patient's allergies indicates no known  allergies.  Home Medications   Prior to Admission medications   Medication Sig Start Date End Date Taking? Authorizing Provider  aspirin EC 325 MG tablet Take 325 mg by mouth every morning.   Yes Historical Provider, MD  fish oil-omega-3 fatty acids 1000 MG capsule Take 2 g by mouth 2 (two) times daily.    Yes Historical Provider, MD  Insulin Glargine (TOUJEO SOLOSTAR) 300 UNIT/ML SOPN Inject 10 Units into the skin at bedtime and may repeat dose one time if needed.   Yes Historical Provider, MD  lisinopril-hydrochlorothiazide (PRINZIDE,ZESTORETIC) 20-12.5 MG per tablet Take 1 tablet by mouth every morning. 12/09/13  Yes Satira Sark, MD  metoprolol tartrate (LOPRESSOR) 25 MG tablet Take 25 mg by mouth 2 (two) times daily.   Yes Historical Provider, MD  pantoprazole (PROTONIX) 40 MG tablet Take 40 mg by mouth every morning.   Yes Historical Provider, MD  pregabalin (LYRICA) 75 MG capsule Take 75 mg by mouth at bedtime.   Yes Historical Provider, MD  simvastatin (ZOCOR) 40 MG tablet Take 1 tablet (40 mg total) by mouth every morning. 12/09/13  Yes Satira Sark, MD   BP 168/94  Pulse 88  Temp(Src) 98.8 F (37.1 C) (Oral)  Resp 20  Ht 5\' 7"  (1.702 m)  Wt 166 lb (75.297 kg)  BMI 25.99 kg/m2  SpO2 99% Physical Exam  Nursing note and vitals reviewed. Constitutional: He is oriented to person, place, and time. He appears well-developed and well-nourished.  Non-toxic appearance.  HENT:  Head: Normocephalic.  Right Ear: Tympanic membrane and external ear normal.  Left Ear: Tympanic membrane and external ear normal.  Eyes: EOM and lids are normal. Pupils are equal, round, and reactive to light.  Neck: Normal range of motion. Neck supple. Carotid bruit is not present.  Cardiovascular: Normal rate, regular rhythm, normal heart sounds, intact distal pulses and normal pulses.   Pulmonary/Chest: No respiratory distress. He has rhonchi.  Abdominal: Soft. Bowel sounds are normal. There is no  tenderness. There is no guarding.  Musculoskeletal: Normal range of motion.  There is increased redness and warmth involving the deltoid area of the left upper extremity. There is tenderness to light palpation in the same area. There is full range of motion of the left elbow and left wrist and fingers. The radial pulses are 2+, and the brachial is also 2+.  Lymphadenopathy:       Head (right side): No submandibular adenopathy present.       Head (left side): No submandibular adenopathy present.    He has no cervical adenopathy.  Neurological: He is alert and oriented to person, place, and time. He has normal strength. No cranial nerve deficit or sensory deficit.  Skin: Skin is warm and dry.  Psychiatric: He has a normal mood and affect. His speech is normal.    ED Course  Procedures (including critical care time) Labs Review  Labs Reviewed - No data to display  Imaging Review No results found.   EKG Interpretation None      MDM  Examination is consistent with a local reaction to the pneumonia shot. The patient is treated with intramuscular Decadron, Vistaril, and Norco. Prescription for Vistaril and Norco also given to the patient. The patient is to follow-up with his primary physician next week for recheck.    Final diagnoses:  Injury    *I have reviewed nursing notes, vital signs, and all appropriate lab and imaging results for this patient.Lenox Ahr, PA-C 12/25/13 2241

## 2013-12-25 NOTE — Discharge Instructions (Signed)
Your examination is consistent with a local reaction to the pneumonia vaccine. Please apply ice. Please use Vistaril at bedtime or every 6 hours if needed for itching, burning, and swelling. Please use Tylenol for mild pain, use Norco for severe pain. Norco and Vistaril may cause drowsiness, please use with caution. Please see Dr. Nevada Crane for additional evaluation if not improving.

## 2013-12-25 NOTE — ED Notes (Signed)
Pt got a pneumonia shot in his left deltoid yesterday and now his right upper arm is red and swollen.

## 2013-12-25 NOTE — ED Notes (Signed)
Patient with no complaints at this time. Respirations even and unlabored. Skin warm/dry. Discharge instructions reviewed with patient at this time. Patient given opportunity to voice concerns/ask questions. Patient discharged at this time and left Emergency Department with steady gait.   

## 2013-12-26 NOTE — ED Provider Notes (Signed)
Medical screening examination/treatment/procedure(s) were performed by non-physician practitioner and as supervising physician I was immediately available for consultation/collaboration.   EKG Interpretation None       Nat Christen, MD 12/26/13 [phone]

## 2014-01-04 ENCOUNTER — Encounter (HOSPITAL_COMMUNITY): Payer: Self-pay | Admitting: *Deleted

## 2014-01-04 ENCOUNTER — Observation Stay (HOSPITAL_COMMUNITY): Payer: BC Managed Care – PPO

## 2014-01-04 ENCOUNTER — Emergency Department (HOSPITAL_COMMUNITY): Payer: BC Managed Care – PPO

## 2014-01-04 ENCOUNTER — Observation Stay (HOSPITAL_COMMUNITY)
Admission: EM | Admit: 2014-01-04 | Discharge: 2014-01-05 | Disposition: A | Discharge status: Home / Self Care | Payer: BC Managed Care – PPO | Attending: Internal Medicine | Admitting: Internal Medicine

## 2014-01-04 DIAGNOSIS — K649 Unspecified hemorrhoids: Secondary | ICD-10-CM | POA: Diagnosis not present

## 2014-01-04 DIAGNOSIS — I10 Essential (primary) hypertension: Secondary | ICD-10-CM | POA: Diagnosis not present

## 2014-01-04 DIAGNOSIS — I739 Peripheral vascular disease, unspecified: Secondary | ICD-10-CM

## 2014-01-04 DIAGNOSIS — J449 Chronic obstructive pulmonary disease, unspecified: Secondary | ICD-10-CM | POA: Diagnosis not present

## 2014-01-04 DIAGNOSIS — Z86010 Personal history of colonic polyps: Secondary | ICD-10-CM | POA: Insufficient documentation

## 2014-01-04 DIAGNOSIS — I2510 Atherosclerotic heart disease of native coronary artery without angina pectoris: Secondary | ICD-10-CM | POA: Insufficient documentation

## 2014-01-04 DIAGNOSIS — G459 Transient cerebral ischemic attack, unspecified: Secondary | ICD-10-CM | POA: Diagnosis not present

## 2014-01-04 DIAGNOSIS — K219 Gastro-esophageal reflux disease without esophagitis: Secondary | ICD-10-CM | POA: Diagnosis not present

## 2014-01-04 DIAGNOSIS — Z7982 Long term (current) use of aspirin: Secondary | ICD-10-CM | POA: Diagnosis not present

## 2014-01-04 DIAGNOSIS — E782 Mixed hyperlipidemia: Secondary | ICD-10-CM

## 2014-01-04 DIAGNOSIS — M1990 Unspecified osteoarthritis, unspecified site: Secondary | ICD-10-CM | POA: Diagnosis not present

## 2014-01-04 DIAGNOSIS — E089 Diabetes mellitus due to underlying condition without complications: Secondary | ICD-10-CM

## 2014-01-04 DIAGNOSIS — F17200 Nicotine dependence, unspecified, uncomplicated: Secondary | ICD-10-CM | POA: Diagnosis present

## 2014-01-04 DIAGNOSIS — Z720 Tobacco use: Secondary | ICD-10-CM

## 2014-01-04 DIAGNOSIS — Z79899 Other long term (current) drug therapy: Secondary | ICD-10-CM | POA: Insufficient documentation

## 2014-01-04 DIAGNOSIS — E119 Type 2 diabetes mellitus without complications: Secondary | ICD-10-CM | POA: Diagnosis not present

## 2014-01-04 DIAGNOSIS — E1159 Type 2 diabetes mellitus with other circulatory complications: Secondary | ICD-10-CM

## 2014-01-04 DIAGNOSIS — M542 Cervicalgia: Secondary | ICD-10-CM | POA: Diagnosis not present

## 2014-01-04 DIAGNOSIS — R42 Dizziness and giddiness: Secondary | ICD-10-CM | POA: Diagnosis present

## 2014-01-04 DIAGNOSIS — R55 Syncope and collapse: Secondary | ICD-10-CM | POA: Insufficient documentation

## 2014-01-04 DIAGNOSIS — I779 Disorder of arteries and arterioles, unspecified: Secondary | ICD-10-CM | POA: Insufficient documentation

## 2014-01-04 DIAGNOSIS — R200 Anesthesia of skin: Secondary | ICD-10-CM | POA: Diagnosis not present

## 2014-01-04 LAB — GLUCOSE, CAPILLARY
Glucose-Capillary: 181 mg/dL — ABNORMAL HIGH (ref 70–99)
Glucose-Capillary: 261 mg/dL — ABNORMAL HIGH (ref 70–99)

## 2014-01-04 LAB — COMPREHENSIVE METABOLIC PANEL
ALT: 26 U/L (ref 0–53)
AST: 20 U/L (ref 0–37)
Albumin: 4 g/dL (ref 3.5–5.2)
Alkaline Phosphatase: 103 U/L (ref 39–117)
Anion gap: 15 (ref 5–15)
BUN: 17 mg/dL (ref 6–23)
CO2: 25 mEq/L (ref 19–32)
Calcium: 9.8 mg/dL (ref 8.4–10.5)
Chloride: 100 mEq/L (ref 96–112)
Creatinine, Ser: 0.82 mg/dL (ref 0.50–1.35)
GFR calc Af Amer: >90 mL/min (ref >90)
GFR calc non Af Amer: >90 mL/min (ref >90)
Glucose, Bld: 178 mg/dL — ABNORMAL HIGH (ref 70–99)
Potassium: 3.6 mEq/L — ABNORMAL LOW (ref 3.7–5.3)
Sodium: 140 mEq/L (ref 137–147)
Total Bilirubin: 0.3 mg/dL (ref 0.3–1.2)
Total Protein: 7.4 g/dL (ref 6.0–8.3)

## 2014-01-04 LAB — CBC
HCT: 44.7 % (ref 39.0–52.0)
Hemoglobin: 15.9 g/dL (ref 13.0–17.0)
MCH: 33.1 pg (ref 26.0–34.0)
MCHC: 35.6 g/dL (ref 30.0–36.0)
MCV: 93.1 fL (ref 78.0–100.0)
Platelets: 310 10*3/uL (ref 150–400)
RBC: 4.8 MIL/uL (ref 4.22–5.81)
RDW: 12.2 % (ref 11.5–15.5)
WBC: 8.3 10*3/uL (ref 4.0–10.5)

## 2014-01-04 LAB — RAPID URINE DRUG SCREEN, HOSP PERFORMED
Amphetamines: NOT DETECTED
Amphetamines: NOT DETECTED
Barbiturates: NOT DETECTED
Barbiturates: NOT DETECTED
Benzodiazepines: NOT DETECTED
Benzodiazepines: NOT DETECTED
Cocaine: NOT DETECTED
Cocaine: NOT DETECTED
Opiates: NOT DETECTED
Opiates: NOT DETECTED
Tetrahydrocannabinol: NOT DETECTED
Tetrahydrocannabinol: NOT DETECTED

## 2014-01-04 LAB — TROPONIN I: Troponin I: <0.3 ng/mL (ref <0.30)

## 2014-01-04 MED ORDER — STROKE: EARLY STAGES OF RECOVERY BOOK
Freq: Once | Status: DC
  Filled 2014-01-04: qty 1

## 2014-01-04 MED ORDER — PREGABALIN 75 MG PO CAPS
75.0000 mg | ORAL_CAPSULE | Freq: Every day | ORAL | Status: DC
  Administered 2014-01-04: 75 mg via ORAL
  Filled 2014-01-04: qty 1

## 2014-01-04 MED ORDER — PANTOPRAZOLE SODIUM 40 MG PO TBEC
40.0000 mg | DELAYED_RELEASE_TABLET | Freq: Every morning | ORAL | Status: DC
  Administered 2014-01-05: 40 mg via ORAL
  Filled 2014-01-04: qty 1

## 2014-01-04 MED ORDER — INSULIN GLARGINE 100 UNIT/ML ~~LOC~~ SOLN
10.0000 [IU] | Freq: Every day | SUBCUTANEOUS | Status: DC
  Administered 2014-01-04: 10 [IU] via SUBCUTANEOUS
  Filled 2014-01-04 (×2): qty 0.1

## 2014-01-04 MED ORDER — INSULIN ASPART 100 UNIT/ML ~~LOC~~ SOLN
0.0000 [IU] | Freq: Three times a day (TID) | SUBCUTANEOUS | Status: DC
  Administered 2014-01-04: 3 [IU] via SUBCUTANEOUS
  Administered 2014-01-05: 5 [IU] via SUBCUTANEOUS
  Administered 2014-01-05: 8 [IU] via SUBCUTANEOUS

## 2014-01-04 MED ORDER — HEPARIN SODIUM (PORCINE) 5000 UNIT/ML IJ SOLN
5000.0000 [IU] | Freq: Three times a day (TID) | INTRAMUSCULAR | Status: DC
  Administered 2014-01-04 – 2014-01-05 (×3): 5000 [IU] via SUBCUTANEOUS
  Filled 2014-01-04 (×3): qty 1

## 2014-01-04 MED ORDER — LISINOPRIL 10 MG PO TABS
20.0000 mg | ORAL_TABLET | Freq: Every day | ORAL | Status: DC
  Administered 2014-01-05: 20 mg via ORAL
  Filled 2014-01-04: qty 2

## 2014-01-04 MED ORDER — SIMVASTATIN 20 MG PO TABS: 40.0000 mg | ORAL_TABLET | Freq: Every morning | ORAL | Status: DC

## 2014-01-04 MED ORDER — SIMVASTATIN 20 MG PO TABS
40.0000 mg | ORAL_TABLET | Freq: Every day | ORAL | Status: DC
  Administered 2014-01-04: 40 mg via ORAL
  Filled 2014-01-04: qty 2

## 2014-01-04 MED ORDER — ASPIRIN 325 MG PO TABS
325.0000 mg | ORAL_TABLET | Freq: Every day | ORAL | Status: DC
  Administered 2014-01-04 – 2014-01-05 (×2): 325 mg via ORAL
  Filled 2014-01-04 (×2): qty 1

## 2014-01-04 MED ORDER — OMEGA-3-ACID ETHYL ESTERS 1 G PO CAPS
2.0000 g | ORAL_CAPSULE | Freq: Two times a day (BID) | ORAL | Status: DC
  Administered 2014-01-04 – 2014-01-05 (×2): 2 g via ORAL
  Filled 2014-01-04 (×2): qty 2

## 2014-01-04 MED ORDER — HYDROCHLOROTHIAZIDE 12.5 MG PO CAPS
12.5000 mg | ORAL_CAPSULE | Freq: Every day | ORAL | Status: DC
  Administered 2014-01-05: 12.5 mg via ORAL
  Filled 2014-01-04: qty 1

## 2014-01-04 MED ORDER — LISINOPRIL-HYDROCHLOROTHIAZIDE 20-12.5 MG PO TABS: 1.0000 | ORAL_TABLET | Freq: Every morning | ORAL | Status: DC

## 2014-01-04 MED ORDER — INSULIN ASPART 100 UNIT/ML ~~LOC~~ SOLN
0.0000 [IU] | Freq: Every day | SUBCUTANEOUS | Status: DC
  Administered 2014-01-04: 3 [IU] via SUBCUTANEOUS

## 2014-01-04 MED ORDER — ASPIRIN EC 81 MG PO TBEC: 81.0000 mg | DELAYED_RELEASE_TABLET | Freq: Every day | ORAL | Status: DC

## 2014-01-04 MED ORDER — METOPROLOL TARTRATE 25 MG PO TABS
25.0000 mg | ORAL_TABLET | Freq: Two times a day (BID) | ORAL | Status: DC
  Administered 2014-01-04 – 2014-01-05 (×2): 25 mg via ORAL
  Filled 2014-01-04 (×2): qty 1

## 2014-01-04 NOTE — Plan of Care (Signed)
Problem: Consults Goal: Ischemic Stroke Patient Education See Patient Education Module for education specifics. Outcome: Completed/Met Date Met:  01/04/14

## 2014-01-04 NOTE — ED Notes (Signed)
[name] at bedside, unable to medicate pt at this time.

## 2014-01-04 NOTE — H&P (Signed)
[facility] History and Physical  AYHAM WORD DJS:[id number] DOB: 1955-04-05 DOA: 01/04/2014  Referring physician: ER PCP: Delphina Cahill, MD   Chief Complaint: dizziness.  HPI: SERAJ DUNNAM is a 58 y.o. male  This is a 58 year old man who has hypertension, diabetes, hyperlipidemia and is a cigarette smoker, who presents with a 2-3 week history of dizziness. He feels like he might pass out/lightheaded. There is no spinning of the room. This morning, when he awoke at 3 AM, he experienced the same dizziness but then also later on in the morning, while he was at work, he experienced tingling in the right side of his mouth and right eye area. He did not have any significant headache. There was no speech difficulty or limb weakness. He says that several years ago he had similar symptoms and was told that these were "mini strokes". He is now being admitted for further evaluation.   Review of Systems:  Constitutional:  No weight loss, night sweats, Fevers, chills, fatigue.  HEENT:  No headaches, Difficulty swallowing,Tooth/dental problems,Sore throat,  No sneezing, itching, ear ache, nasal congestion, post nasal drip,  Cardio-vascular:  No chest pain, Orthopnea, PND, swelling in lower extremities, anasarca, palpitations  GI:  No heartburn, indigestion, abdominal pain, nausea, vomiting, diarrhea, change in bowel habits, loss of appetite  Resp:  No shortness of breath with exertion or at rest. No excess mucus, no productive cough, No non-productive cough, No coughing up of blood.No change in color of mucus.No wheezing.No chest wall deformity  Skin:  no rash or lesions.  GU:  no dysuria, change in color of urine, no urgency or frequency. No flank pain.  Musculoskeletal:  No joint pain or swelling. No decreased range of motion. No back pain.  Psych:  No change in mood or affect. No depression or anxiety. No memory loss.   Past Medical History  Diagnosis Date  . Coronary artery  disease     Nonobstructive  . Type 2 diabetes mellitus   . Mixed hyperlipidemia   . Essential hypertension, benign   . Carotid artery disease   . COPD (chronic obstructive pulmonary disease)   . GERD (gastroesophageal reflux disease)   . Hemorrhoids   . Diabetes mellitus   . Stroke     "ministrokes"  . Arthritis   . Colon polyp    Past Surgical History  Procedure Laterality Date  . Appendectomy  1970  . Cholecystectomy  1993  . Umbilical hernia repair  1993  . Breast surgery      Cyst resection on the right  . Knee surgery      Left knee arthroscopy torn medial meniscus grade 4 chondral changes medial femoral condyle tibial plateau  . Total knee arthroplasty Left 04/27/2012    Procedure: TOTAL KNEE ARTHROPLASTY;  Surgeon: Carole Civil, MD;  Location: [facility];  Service: Orthopedics;  Laterality: Left;  . Colonoscopy  02/12/2007    YIF:[id number] friable anal canal hemorrhoids, otherwise normal rectum and colon  . Colonoscopy N/A 07/16/2012    Procedure: COLONOSCOPY;  Surgeon: [name], MD;  Location: [facility];  Service: Endoscopy;  Laterality: N/A;  1:15-moved to [place] to notify pt  . Esophagogastroduodenoscopy (egd) with esophageal dilation N/A 07/16/2012    Procedure: ESOPHAGOGASTRODUODENOSCOPY (EGD) WITH ESOPHAGEAL DILATION;  Surgeon: [name], MD;  Location: [facility];  Service: Endoscopy;  Laterality: N/A;   Social History:  reports that he has been smoking Cigarettes.  He has a 15  pack-year smoking history. His smokeless tobacco use includes Chew. He reports that he drinks alcohol. He reports that he does not use illicit drugs.  No Known Allergies  Family History  Problem Relation Age of Onset  . Coronary artery disease Father     CABG in his 48's  . Colon cancer Other     maternal great aunt  . Colon cancer Other     paternal great aunt  . Colon polyps Mother      Prior to Admission medications   Medication Sig Start Date End Date  Taking? Authorizing Provider  aspirin EC 81 MG tablet Take 81 mg by mouth daily.   Yes Historical Provider, MD  fish oil-omega-3 fatty acids 1000 MG capsule Take 2 g by mouth 2 (two) times daily.    Yes Historical Provider, MD  Insulin Glargine (TOUJEO SOLOSTAR) 300 UNIT/ML SOPN Inject 10 Units into the skin at bedtime.    Yes Historical Provider, MD  lisinopril-hydrochlorothiazide (PRINZIDE,ZESTORETIC) 20-12.5 MG per tablet Take 1 tablet by mouth every morning. 12/09/13  Yes Satira Sark, MD  metoprolol tartrate (LOPRESSOR) 25 MG tablet Take 25 mg by mouth 2 (two) times daily.   Yes Historical Provider, MD  pantoprazole (PROTONIX) 40 MG tablet Take 40 mg by mouth every morning.   Yes Historical Provider, MD  pregabalin (LYRICA) 75 MG capsule Take 75 mg by mouth at bedtime.   Yes Historical Provider, MD  simvastatin (ZOCOR) 40 MG tablet Take 1 tablet (40 mg total) by mouth every morning. 12/09/13  Yes Satira Sark, MD  HYDROcodone-acetaminophen (NORCO/VICODIN) 5-325 MG per tablet Take 1 tablet by mouth every 4 (four) hours as needed. Patient not taking: Reported on 01/04/2014 12/25/13   Lenox Ahr, PA-C  hydrOXYzine (ATARAX/VISTARIL) 25 MG tablet 1 at hs, or q6h prn redness and swelling. Patient not taking: Reported on 01/04/2014 12/25/13   Lenox Ahr, PA-C   Physical Exam: Filed Vitals:   01/04/14 1230 01/04/14 1232 01/04/14 1242 01/04/14 1345  BP: 145/101   138/89  Pulse: 69 66  65  Temp: 98.4 F (36.9 C)  98.6 F (37 C)   TempSrc: Oral     Resp:    18  Height: 5\' 7"  (1.702 m)     Weight: 73.483 kg (162 lb)     SpO2: 99%   97%    Wt Readings from Last 3 Encounters:  01/04/14 73.483 kg (162 lb)  01/04/14 73.483 kg (162 lb)  12/25/13 75.297 kg (166 lb)    General:  Appears calm and comfortable. Alert and oriented. Eyes: PERRL, normal lids, irises & conjunctiva ENT: grossly normal hearing, lips & tongue Neck: no LAD, masses or thyromegaly Cardiovascular: RRR, no  m/r/g. No LE edema. Telemetry: SR, no arrhythmias  Respiratory: CTA bilaterally, no w/r/r. Normal respiratory effort. Abdomen: soft, ntnd Skin: no rash or induration seen on limited exam Musculoskeletal: grossly normal tone BUE/BLE Psychiatric: grossly normal mood and affect, speech fluent and appropriate Neurologic: grossly non-focal.          Labs on Admission:  Basic Metabolic Panel:  Recent Labs Lab 01/04/14 1250  NA 140  K 3.6*  CL 100  CO2 25  GLUCOSE 178*  BUN 17  CREATININE 0.82  CALCIUM 9.8   Liver Function Tests:  Recent Labs Lab 01/04/14 1250  AST 20  ALT 26  ALKPHOS 103  BILITOT 0.3  PROT 7.4  ALBUMIN 4.0   No results for input(s): LIPASE, AMYLASE in the  last 168 hours. No results for input(s): AMMONIA in the last 168 hours. CBC:  Recent Labs Lab 01/04/14 1250  WBC 8.3  HGB 15.9  HCT 44.7  MCV 93.1  PLT 310   Cardiac Enzymes:  Recent Labs Lab 01/04/14 1250  TROPONINI <0.30    BNP (last 3 results) No results for input(s): PROBNP in the last 8760 hours. CBG: No results for input(s): GLUCAP in the last 168 hours.  Radiological Exams on Admission: Ct Head Wo Contrast  01/04/2014   CLINICAL DATA:  Dizziness.  EXAM: CT HEAD WITHOUT CONTRAST  TECHNIQUE: Contiguous axial images were obtained from the base of the skull through the vertex without intravenous contrast.  COMPARISON:  None.  FINDINGS: Bony calvarium is intact. Minimal diffuse cortical atrophy is noted. Minimal chronic ischemic white matter disease is noted. No mass effect or midline shift is noted. Ventricular size is within normal limits. There is no evidence of mass lesion, hemorrhage or acute infarction.  IMPRESSION: Minimal diffuse cortical atrophy. Minimal chronic ischemic white matter disease. No acute intracranial abnormality seen.   Electronically Signed   By: Sabino Dick M.D.   On: 01/04/2014 15:20   Mr Virgel Paling Wo Contrast  01/04/2014   CLINICAL DATA:  58 year old male  with new onset dizziness and right facial numbness upon waking. Initial encounter.  EXAM: MRI HEAD WITHOUT CONTRAST  MRA HEAD WITHOUT CONTRAST  TECHNIQUE: Multiplanar, multiecho pulse sequences of the brain and surrounding structures were obtained without intravenous contrast. Angiographic images of the head were obtained using MRA technique without contrast.  COMPARISON:  Neck MRA 08/27/2006.  FINDINGS: MRI HEAD FINDINGS  No restricted diffusion to suggest acute infarction. No midline shift, mass effect, evidence of mass lesion, ventriculomegaly, extra-axial collection or acute intracranial hemorrhage. Cervicomedullary junction and pituitary are within normal limits. Grossly negative visualized cervical spine.  Major intracranial vascular flow voids are preserved, there is a degree of intracranial artery dolichoectasia, especially at the vertebrobasilar junction.  Scattered small and patchy areas of cerebral white matter T2 and FLAIR hyperintensity. Configuration is nonspecific. Minimal involvement of the temporal lobes. Patchy more confluent T2 and FLAIR hyperintensity in the pons. Mild T2 heterogeneity in the deep gray matter nuclei. No cortical encephalomalacia. Cerebellum within normal limits.  Visualized orbit soft tissues are within normal limits. Visualized paranasal sinuses and mastoids are clear. Normal bone marrow signal. Visualized scalp soft tissues are within normal limits.  Visible internal auditory structures appear grossly normal, except for some loss of T2 signal in the left IAC (series 4, image 6).  MRA HEAD FINDINGS  Dominant distal left vertebral artery. Antegrade flow in the posterior circulation. Dolichoectatic, tortuous vertebrobasilar junction. Normal PICA origins. Tortuous proximal basilar artery. No basilar stenosis. Normal PCA origins. Normal AICA origins. Posterior communicating arteries are diminutive or absent. Bilateral PCA branches are within normal limits.  Antegrade flow in both  ICA siphons. Tortuous distal cervical ICAs. ICA siphon irregularity in keeping with atherosclerosis, but no siphon stenosis identified. Ophthalmic artery origins are within normal limits. Patent carotid termini. Normal MCA and ACA origins. Anterior communicating artery and visualized bilateral ACA branches are within normal limits. Visualized bilateral MCA branches are within normal limits.  IMPRESSION: 1.  No acute intracranial abnormality. 2. Moderate for age nonspecific signal changes in the brain. Superimposed intracranial artery dolichoectasia favors chronic hypertensive or small vessel disease. 3. Otherwise negative intracranial MRA. 4. Real versus artifactual loss of CSF signal in the left internal auditory canal. If dizziness persists or is  accompanied by left side hearing loss, then recommend followup dedicated IAC brain MRI without and with contrast.   Electronically Signed   By: Lars Pinks M.D.   On: 01/04/2014 15:24   Mr Brain Wo Contrast  01/04/2014   CLINICAL DATA:  58 year old male with new onset dizziness and right facial numbness upon waking. Initial encounter.  EXAM: MRI HEAD WITHOUT CONTRAST  MRA HEAD WITHOUT CONTRAST  TECHNIQUE: Multiplanar, multiecho pulse sequences of the brain and surrounding structures were obtained without intravenous contrast. Angiographic images of the head were obtained using MRA technique without contrast.  COMPARISON:  Neck MRA 08/27/2006.  FINDINGS: MRI HEAD FINDINGS  No restricted diffusion to suggest acute infarction. No midline shift, mass effect, evidence of mass lesion, ventriculomegaly, extra-axial collection or acute intracranial hemorrhage. Cervicomedullary junction and pituitary are within normal limits. Grossly negative visualized cervical spine.  Major intracranial vascular flow voids are preserved, there is a degree of intracranial artery dolichoectasia, especially at the vertebrobasilar junction.  Scattered small and patchy areas of cerebral white matter  T2 and FLAIR hyperintensity. Configuration is nonspecific. Minimal involvement of the temporal lobes. Patchy more confluent T2 and FLAIR hyperintensity in the pons. Mild T2 heterogeneity in the deep gray matter nuclei. No cortical encephalomalacia. Cerebellum within normal limits.  Visualized orbit soft tissues are within normal limits. Visualized paranasal sinuses and mastoids are clear. Normal bone marrow signal. Visualized scalp soft tissues are within normal limits.  Visible internal auditory structures appear grossly normal, except for some loss of T2 signal in the left IAC (series 4, image 6).  MRA HEAD FINDINGS  Dominant distal left vertebral artery. Antegrade flow in the posterior circulation. Dolichoectatic, tortuous vertebrobasilar junction. Normal PICA origins. Tortuous proximal basilar artery. No basilar stenosis. Normal PCA origins. Normal AICA origins. Posterior communicating arteries are diminutive or absent. Bilateral PCA branches are within normal limits.  Antegrade flow in both ICA siphons. Tortuous distal cervical ICAs. ICA siphon irregularity in keeping with atherosclerosis, but no siphon stenosis identified. Ophthalmic artery origins are within normal limits. Patent carotid termini. Normal MCA and ACA origins. Anterior communicating artery and visualized bilateral ACA branches are within normal limits. Visualized bilateral MCA branches are within normal limits.  IMPRESSION: 1.  No acute intracranial abnormality. 2. Moderate for age nonspecific signal changes in the brain. Superimposed intracranial artery dolichoectasia favors chronic hypertensive or small vessel disease. 3. Otherwise negative intracranial MRA. 4. Real versus artifactual loss of CSF signal in the left internal auditory canal. If dizziness persists or is accompanied by left side hearing loss, then recommend followup dedicated IAC brain MRI without and with contrast.   Electronically Signed   By: Lars Pinks M.D.   On: 01/04/2014  15:24    EKG: Independently reviewed. Normal sinus rhythm without any acute ST-T wave changes.  Assessment/Plan   1. Dizziness, unclear etiology. 2. Right facial/I tingling, unclear etiology. 3. Hypertension. 4. Diabetes. 5. Hyperlipidemia. 6. Tobacco abuse.  Plan: 1. Admit to medical floor. 2. Carotid Dopplers.Increase aspirin dose to 325 mg daily. 3. Neurology consultation. 4. Monitor and control diabetes.  Further recommendations will depend on patient's hospital progress.   Code Status: full code.  DVT Prophylaxis:HEPARIN.  Family Communication: I discussed the plan with the patient at the bedside.  Disposition Plan: home when medically stable.  Time spent: 60 minutes.  Doree Albee [facility] Pager [phone].

## 2014-01-04 NOTE — ED Notes (Addendum)
Pt with lightheadedness at 0300, right facial numbness at 0900 this morning, continues to feel numb, states right side feels weak, pt states hx of "mini strokes"

## 2014-01-04 NOTE — ED Provider Notes (Signed)
CSN: [id number]     Arrival date & time 01/04/14  1216 History   None    Chief Complaint  Patient presents with  . Dizziness     (Consider location/radiation/quality/duration/timing/severity/associated sxs/prior Treatment) HPI  Past Medical History  Diagnosis Date  . Coronary artery disease     Nonobstructive  . Type 2 diabetes mellitus   . Mixed hyperlipidemia   . Essential hypertension, benign   . Carotid artery disease   . COPD (chronic obstructive pulmonary disease)   . GERD (gastroesophageal reflux disease)   . Hemorrhoids   . Diabetes mellitus   . Stroke     "ministrokes"  . Arthritis   . Colon polyp    Past Surgical History  Procedure Laterality Date  . Appendectomy  1970  . Cholecystectomy  1993  . Umbilical hernia repair  1993  . Breast surgery      Cyst resection on the right  . Knee surgery      Left knee arthroscopy torn medial meniscus grade 4 chondral changes medial femoral condyle tibial plateau  . Total knee arthroplasty Left 04/27/2012    Procedure: TOTAL KNEE ARTHROPLASTY;  Surgeon: Carole Civil, MD;  Location: [facility];  Service: Orthopedics;  Laterality: Left;  . Colonoscopy  02/12/2007    [id number] friable anal canal hemorrhoids, otherwise normal rectum and colon  . Colonoscopy N/A 07/16/2012    Procedure: COLONOSCOPY;  Surgeon: [name], MD;  Location: [facility];  Service: Endoscopy;  Laterality: N/A;  1:15-moved to [place] to notify pt  . Esophagogastroduodenoscopy (egd) with esophageal dilation N/A 07/16/2012    Procedure: ESOPHAGOGASTRODUODENOSCOPY (EGD) WITH ESOPHAGEAL DILATION;  Surgeon: [name], MD;  Location: [facility];  Service: Endoscopy;  Laterality: N/A;   Family History  Problem Relation Age of Onset  . Coronary artery disease Father     CABG in his 18's  . Colon cancer Other     maternal great aunt  . Colon cancer Other     paternal great aunt  . Colon polyps Mother    History  Substance Use  Topics  . Smoking status: Current Every Day Smoker -- 0.50 packs/day for 30 years    Types: Cigarettes  . Smokeless tobacco: Current User    Types: Chew     Comment: vapor cig. 4-5 ciggs per day  . Alcohol Use: Yes     Comment: Socially    Review of Systems    Allergies  Review of patient's allergies indicates no known allergies.  Home Medications   Prior to Admission medications   Medication Sig Start Date End Date Taking? Authorizing Provider  aspirin EC 325 MG tablet Take 325 mg by mouth every morning.    Historical Provider, MD  fish oil-omega-3 fatty acids 1000 MG capsule Take 2 g by mouth 2 (two) times daily.     Historical Provider, MD  HYDROcodone-acetaminophen (NORCO/VICODIN) 5-325 MG per tablet Take 1 tablet by mouth every 4 (four) hours as needed. 12/25/13   Lenox Ahr, PA-C  hydrOXYzine (ATARAX/VISTARIL) 25 MG tablet 1 at hs, or q6h prn redness and swelling. 12/25/13   Lenox Ahr, PA-C  Insulin Glargine (TOUJEO SOLOSTAR) 300 UNIT/ML SOPN Inject 10 Units into the skin at bedtime and may repeat dose one time if needed.    Historical Provider, MD  lisinopril-hydrochlorothiazide (PRINZIDE,ZESTORETIC) 20-12.5 MG per tablet Take 1 tablet by mouth every morning. 12/09/13   Satira Sark, MD  metoprolol tartrate (  LOPRESSOR) 25 MG tablet Take 25 mg by mouth 2 (two) times daily.    Historical Provider, MD  pantoprazole (PROTONIX) 40 MG tablet Take 40 mg by mouth every morning.    Historical Provider, MD  pregabalin (LYRICA) 75 MG capsule Take 75 mg by mouth at bedtime.    Historical Provider, MD  simvastatin (ZOCOR) 40 MG tablet Take 1 tablet (40 mg total) by mouth every morning. 12/09/13   Satira Sark, MD   BP 145/101 mmHg  Pulse 66  Temp(Src) 98.6 F (37 C) (Oral)  Ht 5\' 7"  (1.702 m)  Wt 162 lb (73.483 kg)  BMI 25.37 kg/m2  SpO2 99% Physical Exam  ED Course  Procedures (including critical care time) Labs Review Labs Reviewed - No data to  display  Imaging Review No results found.   EKG Interpretation None      MDM   Final diagnoses:  None    Delete--duplicate note    Orlie Dakin, MD 01/04/14 2203

## 2014-01-04 NOTE — ED Provider Notes (Signed)
CSN: [id number]     Arrival date & time 01/04/14  1216 History  This chart was scribed for Orlie Dakin, MD by Rayfield Citizen, ED Scribe. This patient was seen in room [facility]06/[facility]06 and the patient's care was started at 1:03 PM.    Chief Complaint  Patient presents with  . Dizziness   HPI   HPI Comments: Jimmy Duran is a 58 y.o. male with past medical history of " mini strokes" who presents to the Emergency Department complaining of lightheadedness and facial numbness. Patient reports an episode of lightheadedness upon waking at 0300 this morning, with gradual onset right-sided facial numbness at 0900. This facial numbness has lingered around the right side of his mouth. Patient reports 2-3 weeks of intermittent shooting pain in his neck, behind his right ear. He also notes that his right eye feels "puffy"; he denies anyvisual disturbances . Patient asymptomatic presently really without treatment. He took his usual medicines today including aspirin 325 mg. Symptoms intermittent lasting anywhere from 10 minutes to one hour. Nothing makes symptoms better or worse  Patient reports a history of mini strokes (last instance 2003); he states that his symptoms at this time feel similar to these mini strokes.  He reports blockages in his carotid arteries and heart. He takes aspirin (325mg ) every day. He also reports a history of DM; this is managed with insulin.   His PCP is Dr. Delphina Cahill, last visit 12/24/13.   He is a smoker. He denies any EtOH usage or illegal drugs.   Past Medical History  Diagnosis Date  . Coronary artery disease     Nonobstructive  . Type 2 diabetes mellitus   . Mixed hyperlipidemia   . Essential hypertension, benign   . Carotid artery disease   . COPD (chronic obstructive pulmonary disease)   . GERD (gastroesophageal reflux disease)   . Hemorrhoids   . Diabetes mellitus   . Stroke     "ministrokes"  . Arthritis   . Colon polyp    Past Surgical History  Procedure  Laterality Date  . Appendectomy  1970  . Cholecystectomy  1993  . Umbilical hernia repair  1993  . Breast surgery      Cyst resection on the right  . Knee surgery      Left knee arthroscopy torn medial meniscus grade 4 chondral changes medial femoral condyle tibial plateau  . Total knee arthroplasty Left 04/27/2012    Procedure: TOTAL KNEE ARTHROPLASTY;  Surgeon: Carole Civil, MD;  Location: [facility];  Service: Orthopedics;  Laterality: Left;  . Colonoscopy  02/12/2007    YQM:VHQIONGEX friable anal canal hemorrhoids, otherwise normal rectum and colon  . Colonoscopy N/A 07/16/2012    Procedure: COLONOSCOPY;  Surgeon: [name], MD;  Location: [facility];  Service: Endoscopy;  Laterality: N/A;  1:15-moved to [place] to notify pt  . Esophagogastroduodenoscopy (egd) with esophageal dilation N/A 07/16/2012    Procedure: ESOPHAGOGASTRODUODENOSCOPY (EGD) WITH ESOPHAGEAL DILATION;  Surgeon: [name], MD;  Location: [facility];  Service: Endoscopy;  Laterality: N/A;   Family History  Problem Relation Age of Onset  . Coronary artery disease Father     CABG in his 67's  . Colon cancer Other     maternal great aunt  . Colon cancer Other     paternal great aunt  . Colon polyps Mother    History  Substance Use Topics  . Smoking status: Current Every Day Smoker -- 0.50  packs/day for 30 years    Types: Cigarettes  . Smokeless tobacco: Current User    Types: Chew     Comment: vapor cig. 4-5 ciggs per day  . Alcohol Use: Yes     Comment: Socially    Review of Systems  Constitutional: Negative.   HENT: Negative.   Respiratory: Negative.   Cardiovascular: Negative.  Negative for chest pain.       Syncope  Gastrointestinal: Negative.   Musculoskeletal: Positive for neck pain.       Posterolateral right-sided neck pain  Skin: Negative.   Allergic/Immunologic: Negative.   Neurological: Positive for numbness.  Psychiatric/Behavioral: Negative.   All other systems  reviewed and are negative.     Allergies  Review of patient's allergies indicates no known allergies.  Home Medications   Prior to Admission medications   Medication Sig Start Date End Date Taking? Authorizing Provider  aspirin EC 325 MG tablet Take 325 mg by mouth every morning.    Historical Provider, MD  fish oil-omega-3 fatty acids 1000 MG capsule Take 2 g by mouth 2 (two) times daily.     Historical Provider, MD  HYDROcodone-acetaminophen (NORCO/VICODIN) 5-325 MG per tablet Take 1 tablet by mouth every 4 (four) hours as needed. 12/25/13   Lenox Ahr, PA-C  hydrOXYzine (ATARAX/VISTARIL) 25 MG tablet 1 at hs, or q6h prn redness and swelling. 12/25/13   Lenox Ahr, PA-C  Insulin Glargine (TOUJEO SOLOSTAR) 300 UNIT/ML SOPN Inject 10 Units into the skin at bedtime and may repeat dose one time if needed.    Historical Provider, MD  lisinopril-hydrochlorothiazide (PRINZIDE,ZESTORETIC) 20-12.5 MG per tablet Take 1 tablet by mouth every morning. 12/09/13   Satira Sark, MD  metoprolol tartrate (LOPRESSOR) 25 MG tablet Take 25 mg by mouth 2 (two) times daily.    Historical Provider, MD  pantoprazole (PROTONIX) 40 MG tablet Take 40 mg by mouth every morning.    Historical Provider, MD  pregabalin (LYRICA) 75 MG capsule Take 75 mg by mouth at bedtime.    Historical Provider, MD  simvastatin (ZOCOR) 40 MG tablet Take 1 tablet (40 mg total) by mouth every morning. 12/09/13   Satira Sark, MD   BP 145/101 mmHg  Pulse 66  Temp(Src) 98.6 F (37 C) (Oral)  Ht 5\' 7"  (1.702 m)  Wt 162 lb (73.483 kg)  BMI 25.37 kg/m2  SpO2 99% Physical Exam  Constitutional: He is oriented to person, place, and time. He appears well-developed and well-nourished.  HENT:  Head: Normocephalic and atraumatic.  Eyes: Conjunctivae are normal. Pupils are equal, round, and reactive to light.  Neck: Neck supple. No tracheal deviation present. No thyromegaly present.  Cardiovascular: Normal rate and  regular rhythm.   No murmur heard. Pulmonary/Chest: Effort normal and breath sounds normal.  Abdominal: Soft. Bowel sounds are normal. He exhibits no distension. There is no tenderness.  Musculoskeletal: Normal range of motion. He exhibits no edema or tenderness.  Neurological: He is alert and oriented to person, place, and time. He has normal reflexes. Coordination normal.  DTR symmetric bilaterally knee jerk ankle jerk biceps historical and bilaterally. Gait normal Romberg normal pronator drift normal finger to nose normal motor strength 5 over 5 overall  Skin: Skin is warm and dry. No rash noted.  Psychiatric: He has a normal mood and affect.  Nursing note and vitals reviewed.   ED Course  Procedures   DIAGNOSTIC STUDIES: Oxygen Saturation is 99% on RA, normal by my interpretation.  COORDINATION OF CARE: 1:12 PM Discussed treatment plan with pt at bedside and pt agreed to plan.   Labs Review Labs Reviewed - No data to display  Imaging Review No results found.   EKG Interpretation   Date/Time:  Tuesday January 04 2014 13:36:56 EST Ventricular Rate:  65 PR Interval:  167 QRS Duration: 88 QT Interval:  401 QTC Calculation: 417 R Axis:   23 Text Interpretation:  Sinus rhythm No significant change since last  tracing Confirmed by Winfred Leeds  MD,  [phone]) on 01/04/2014 2:11:37 PM     Results for orders placed or performed during the hospital encounter of 01/04/14  Urine Drug Screen  Result Value Ref Range   Opiates NONE DETECTED NONE DETECTED   Cocaine NONE DETECTED NONE DETECTED   Benzodiazepines NONE DETECTED NONE DETECTED   Amphetamines NONE DETECTED NONE DETECTED   Tetrahydrocannabinol NONE DETECTED NONE DETECTED   Barbiturates NONE DETECTED NONE DETECTED  CBC  Result Value Ref Range   WBC 8.3 4.0 - 10.5 K/uL   RBC 4.80 4.22 - 5.81 MIL/uL   Hemoglobin 15.9 13.0 - 17.0 g/dL   HCT 44.7 39.0 - 52.0 %   MCV 93.1 78.0 - 100.0 fL   MCH 33.1 26.0 - 34.0 pg    MCHC 35.6 30.0 - 36.0 g/dL   RDW 12.2 11.5 - 15.5 %   Platelets 310 150 - 400 K/uL  Comprehensive metabolic panel  Result Value Ref Range   Sodium 140 137 - 147 mEq/L   Potassium 3.6 (L) 3.7 - 5.3 mEq/L   Chloride 100 96 - 112 mEq/L   CO2 25 19 - 32 mEq/L   Glucose, Bld 178 (H) 70 - 99 mg/dL   BUN 17 6 - 23 mg/dL   Creatinine, Ser 0.82 0.50 - 1.35 mg/dL   Calcium 9.8 8.4 - 10.5 mg/dL   Total Protein 7.4 6.0 - 8.3 g/dL   Albumin 4.0 3.5 - 5.2 g/dL   AST 20 0 - 37 U/L   ALT 26 0 - 53 U/L   Alkaline Phosphatase 103 39 - 117 U/L   Total Bilirubin 0.3 0.3 - 1.2 mg/dL   GFR calc non Af Amer >90 >90 mL/min   GFR calc Af Amer >90 >90 mL/min   Anion gap 15 5 - 15  Troponin I  Result Value Ref Range   Troponin I <0.30 <0.30 ng/mL   Ct Head Wo Contrast  01/04/2014   CLINICAL DATA:  Dizziness.  EXAM: CT HEAD WITHOUT CONTRAST  TECHNIQUE: Contiguous axial images were obtained from the base of the skull through the vertex without intravenous contrast.  COMPARISON:  None.  FINDINGS: Bony calvarium is intact. Minimal diffuse cortical atrophy is noted. Minimal chronic ischemic white matter disease is noted. No mass effect or midline shift is noted. Ventricular size is within normal limits. There is no evidence of mass lesion, hemorrhage or acute infarction.  IMPRESSION: Minimal diffuse cortical atrophy. Minimal chronic ischemic white matter disease. No acute intracranial abnormality seen.   Electronically Signed   By: Sabino Dick M.D.   On: 01/04/2014 15:20   Mr Virgel Paling Wo Contrast  01/04/2014   CLINICAL DATA:  58 year old male with new onset dizziness and right facial numbness upon waking. Initial encounter.  EXAM: MRI HEAD WITHOUT CONTRAST  MRA HEAD WITHOUT CONTRAST  TECHNIQUE: Multiplanar, multiecho pulse sequences of the brain and surrounding structures were obtained without intravenous contrast. Angiographic images of the head were obtained using MRA technique without contrast.  COMPARISON:  Neck  MRA 08/27/2006.  FINDINGS: MRI HEAD FINDINGS  No restricted diffusion to suggest acute infarction. No midline shift, mass effect, evidence of mass lesion, ventriculomegaly, extra-axial collection or acute intracranial hemorrhage. Cervicomedullary junction and pituitary are within normal limits. Grossly negative visualized cervical spine.  Major intracranial vascular flow voids are preserved, there is a degree of intracranial artery dolichoectasia, especially at the vertebrobasilar junction.  Scattered small and patchy areas of cerebral white matter T2 and FLAIR hyperintensity. Configuration is nonspecific. Minimal involvement of the temporal lobes. Patchy more confluent T2 and FLAIR hyperintensity in the pons. Mild T2 heterogeneity in the deep gray matter nuclei. No cortical encephalomalacia. Cerebellum within normal limits.  Visualized orbit soft tissues are within normal limits. Visualized paranasal sinuses and mastoids are clear. Normal bone marrow signal. Visualized scalp soft tissues are within normal limits.  Visible internal auditory structures appear grossly normal, except for some loss of T2 signal in the left IAC (series 4, image 6).  MRA HEAD FINDINGS  Dominant distal left vertebral artery. Antegrade flow in the posterior circulation. Dolichoectatic, tortuous vertebrobasilar junction. Normal PICA origins. Tortuous proximal basilar artery. No basilar stenosis. Normal PCA origins. Normal AICA origins. Posterior communicating arteries are diminutive or absent. Bilateral PCA branches are within normal limits.  Antegrade flow in both ICA siphons. Tortuous distal cervical ICAs. ICA siphon irregularity in keeping with atherosclerosis, but no siphon stenosis identified. Ophthalmic artery origins are within normal limits. Patent carotid termini. Normal MCA and ACA origins. Anterior communicating artery and visualized bilateral ACA branches are within normal limits. Visualized bilateral MCA branches are within  normal limits.  IMPRESSION: 1.  No acute intracranial abnormality. 2. Moderate for age nonspecific signal changes in the brain. Superimposed intracranial artery dolichoectasia favors chronic hypertensive or small vessel disease. 3. Otherwise negative intracranial MRA. 4. Real versus artifactual loss of CSF signal in the left internal auditory canal. If dizziness persists or is accompanied by left side hearing loss, then recommend followup dedicated IAC brain MRI without and with contrast.   Electronically Signed   By: Lars Pinks M.D.   On: 01/04/2014 15:24   Mr Brain Wo Contrast  01/04/2014   CLINICAL DATA:  58 year old male with new onset dizziness and right facial numbness upon waking. Initial encounter.  EXAM: MRI HEAD WITHOUT CONTRAST  MRA HEAD WITHOUT CONTRAST  TECHNIQUE: Multiplanar, multiecho pulse sequences of the brain and surrounding structures were obtained without intravenous contrast. Angiographic images of the head were obtained using MRA technique without contrast.  COMPARISON:  Neck MRA 08/27/2006.  FINDINGS: MRI HEAD FINDINGS  No restricted diffusion to suggest acute infarction. No midline shift, mass effect, evidence of mass lesion, ventriculomegaly, extra-axial collection or acute intracranial hemorrhage. Cervicomedullary junction and pituitary are within normal limits. Grossly negative visualized cervical spine.  Major intracranial vascular flow voids are preserved, there is a degree of intracranial artery dolichoectasia, especially at the vertebrobasilar junction.  Scattered small and patchy areas of cerebral white matter T2 and FLAIR hyperintensity. Configuration is nonspecific. Minimal involvement of the temporal lobes. Patchy more confluent T2 and FLAIR hyperintensity in the pons. Mild T2 heterogeneity in the deep gray matter nuclei. No cortical encephalomalacia. Cerebellum within normal limits.  Visualized orbit soft tissues are within normal limits. Visualized paranasal sinuses and  mastoids are clear. Normal bone marrow signal. Visualized scalp soft tissues are within normal limits.  Visible internal auditory structures appear grossly normal, except for some loss of T2 signal in the left IAC (series 4, image  6).  MRA HEAD FINDINGS  Dominant distal left vertebral artery. Antegrade flow in the posterior circulation. Dolichoectatic, tortuous vertebrobasilar junction. Normal PICA origins. Tortuous proximal basilar artery. No basilar stenosis. Normal PCA origins. Normal AICA origins. Posterior communicating arteries are diminutive or absent. Bilateral PCA branches are within normal limits.  Antegrade flow in both ICA siphons. Tortuous distal cervical ICAs. ICA siphon irregularity in keeping with atherosclerosis, but no siphon stenosis identified. Ophthalmic artery origins are within normal limits. Patent carotid termini. Normal MCA and ACA origins. Anterior communicating artery and visualized bilateral ACA branches are within normal limits. Visualized bilateral MCA branches are within normal limits.  IMPRESSION: 1.  No acute intracranial abnormality. 2. Moderate for age nonspecific signal changes in the brain. Superimposed intracranial artery dolichoectasia favors chronic hypertensive or small vessel disease. 3. Otherwise negative intracranial MRA. 4. Real versus artifactual loss of CSF signal in the left internal auditory canal. If dizziness persists or is accompanied by left side hearing loss, then recommend followup dedicated IAC brain MRI without and with contrast.   Electronically Signed   By: Lars Pinks M.D.   On: 01/04/2014 15:24   430 pm pt asymptomatic, alert, gcs 15 MDM  Spoke with Dr. Anastasio Champion plan 23 hour observation Diagnosis#1 transient ischemic attack #2 hyperglycemia Final diagnoses:  None     I personally performed the services described in this documentation, which was scribed in my presence. The recorded information has been reviewed and considered.     Orlie Dakin, MD 01/04/14 [phone]

## 2014-01-04 NOTE — ED Notes (Signed)
Attempted to call report, unsuccessful, bed may be reassigned.

## 2014-01-04 NOTE — ED Notes (Signed)
Pt ambulated well to restroom w/o complaints of dizziness.

## 2014-01-05 DIAGNOSIS — E108 Type 1 diabetes mellitus with unspecified complications: Secondary | ICD-10-CM

## 2014-01-05 LAB — LIPID PANEL
Cholesterol: 230 mg/dL — ABNORMAL HIGH (ref 0–200)
LDL Cholesterol: UNDETERMINED mg/dL (ref 0–99)
Triglycerides: 1469 mg/dL — ABNORMAL HIGH (ref <150)
VLDL: UNDETERMINED mg/dL (ref 0–40)

## 2014-01-05 LAB — GLUCOSE, CAPILLARY
Glucose-Capillary: 254 mg/dL — ABNORMAL HIGH (ref 70–99)
Glucose-Capillary: 221 mg/dL — ABNORMAL HIGH (ref 70–99)

## 2014-01-05 LAB — HEMOGLOBIN A1C
Hgb A1c MFr Bld: 7.8 % — ABNORMAL HIGH (ref <5.7)
Mean Plasma Glucose: 177 mg/dL — ABNORMAL HIGH (ref <117)

## 2014-01-05 MED ORDER — MECLIZINE HCL 12.5 MG PO TABS
25.0000 mg | ORAL_TABLET | Freq: Three times a day (TID) | ORAL | Status: DC
  Administered 2014-01-05: 25 mg via ORAL
  Filled 2014-01-05: qty 2

## 2014-01-05 MED ORDER — NIACIN ER (ANTIHYPERLIPIDEMIC) 500 MG PO TBCR: 500.0000 mg | EXTENDED_RELEASE_TABLET | Freq: Every day | ORAL | Status: DC

## 2014-01-05 MED ORDER — ASPIRIN 325 MG PO TABS: 325.0000 mg | ORAL_TABLET | Freq: Every day | ORAL | Status: DC

## 2014-01-05 NOTE — Discharge Summary (Signed)
Physician Discharge Summary  FARMER MCCAHILL LNL:[id number] DOB: 1956/02/28 DOA: 01/04/2014  PCP: Delphina Cahill, MD  Admit date: 01/04/2014 Discharge date: 01/05/2014  Time spent: 40 minutes  Recommendations for Outpatient Follow-up:  1. Follow up with PCP for evaluation of symptoms  2. Follow up with neurology as scheduled 01/25/14   Discharge Diagnoses:  Principal Problem:   Transient ischemic attack Active Problems:   Mixed hyperlipidemia   Tobacco use disorder   Essential hypertension, benign   Diabetes   Dizziness   Discharge Condition: stable  Diet recommendation: heart healthy carb modifed  Filed Weights   01/04/14 1230 01/04/14 1811 01/05/14 0441  Weight: 73.483 kg (162 lb) 71.668 kg (158 lb) 71.215 kg (157 lb)    History of present illness:  This is a 58 year old man who has hypertension, diabetes, hyperlipidemia and is a cigarette smoker, who presented on 01/04/14 with a 2-3 week history of dizziness. He felt like he might pass out/lightheaded. There was no spinning of the room. The morning of presentation, when he awoke at 3 AM, he experienced the dizziness but then also later on in the morning, while he was at work, he experienced tingling in the right side of his mouth and right eye area. He did not have any significant headache. There was no speech difficulty or limb weakness. He said that several years ago he had similar symptoms and was told that these were "mini strokes".  Hospital Course:  TIA: hx of same. Difficult to control BP, A1c 7.8, triglyceride 1468, cholesterol 230, troponin negative x3. EKG with SR, carotid doppler with less 50% stenosis bilaterally and decreased velocity recommending surveilance, MRA/MRI no acute changes. Evaluated by PT who had no recommendations.  Patient home medications include 81mg  asa and statin. Will increase asa and add niacin. Counseled to stop smoking.  Has follow up appointment with neurology 01/25/13  1. Hypertension. Hx  difficult to control. Has Dr Domenic Polite note dated 12/09/13 indicates lopressor could be advanced if needed.  2. Diabetes.: poor control. Inulin recently increased.  3. Hyperlipidemia. See above Tobacco abuse: cessation counseling offered  Procedures:  As below  Consultations:  none  Discharge Exam: Filed Vitals:   01/05/14 1030  BP: 137/87  Pulse: 80  Temp:   Resp:     General: well nourished NAD, looking older than age Cardiovascular: RRR no MGR No LE edema Respiratory: normal effort BS coarse no wheeze no rhonchi Neuro: speech clear, facial symmetry steady gait  Discharge Instructions You were cared for by a hospitalist during your hospital stay. If you have any questions about your discharge medications or the care you received while you were in the hospital after you are discharged, you can call the unit and asked to speak with the hospitalist on call if the hospitalist that took care of you is not available. Once you are discharged, your primary care physician will handle any further medical issues. Please note that NO REFILLS for any discharge medications will be authorized once you are discharged, as it is imperative that you return to your primary care physician (or establish a relationship with a primary care physician if you do not have one) for your aftercare needs so that they can reassess your need for medications and monitor your lab values.  Discharge Instructions    Diet - low sodium heart healthy    Complete by:  As directed      Diet Carb Modified    Complete by:  As directed  Increase activity slowly    Complete by:  As directed           Discharge Medication List as of 01/05/2014 12:27 PM    START taking these medications   Details  aspirin 325 MG tablet Take 1 tablet (325 mg total) by mouth daily., Starting 01/05/2014, Until Discontinued, Print    niacin (NIASPAN) 500 MG CR tablet Take 1 tablet (500 mg total) by mouth at bedtime., Starting 01/05/2014,  Until Discontinued, Print      CONTINUE these medications which have NOT CHANGED   Details  fish oil-omega-3 fatty acids 1000 MG capsule Take 2 g by mouth 2 (two) times daily. , Until Discontinued, Historical Med    HYDROcodone-acetaminophen (NORCO/VICODIN) 5-325 MG per tablet Take 1 tablet by mouth every 4 (four) hours as needed., Starting 12/25/2013, Until Discontinued, Print    hydrOXYzine (ATARAX/VISTARIL) 25 MG tablet 1 at hs, or q6h prn redness and swelling., Print    Insulin Glargine (TOUJEO SOLOSTAR) 300 UNIT/ML SOPN Inject 10 Units into the skin at bedtime. , Until Discontinued, Historical Med    lisinopril-hydrochlorothiazide (PRINZIDE,ZESTORETIC) 20-12.5 MG per tablet Take 1 tablet by mouth every morning., Starting 12/09/2013, Until Discontinued, Normal    metoprolol tartrate (LOPRESSOR) 25 MG tablet Take 25 mg by mouth 2 (two) times daily., Until Discontinued, Historical Med    pantoprazole (PROTONIX) 40 MG tablet Take 40 mg by mouth every morning., Until Discontinued, Historical Med    pregabalin (LYRICA) 75 MG capsule Take 75 mg by mouth at bedtime., Until Discontinued, Historical Med    simvastatin (ZOCOR) 40 MG tablet Take 1 tablet (40 mg total) by mouth every morning., Starting 12/09/2013, Until Discontinued, Normal      STOP taking these medications     aspirin EC 81 MG tablet        No Known Allergies Follow-up Information    Follow up with Phillips Odor, MD On 01/25/2014.   Specialty:  Neurology   Why:  at 2:00 pm. Please call office before appointment.   Contact information:   [address] [place] [address] [phone]        The results of significant diagnostics from this hospitalization (including imaging, microbiology, ancillary and laboratory) are listed below for reference.    Significant Diagnostic Studies: Ct Head Wo Contrast  01/04/2014   CLINICAL DATA:  Dizziness.  EXAM: CT HEAD WITHOUT CONTRAST  TECHNIQUE: Contiguous axial  images were obtained from the base of the skull through the vertex without intravenous contrast.  COMPARISON:  None.  FINDINGS: Bony calvarium is intact. Minimal diffuse cortical atrophy is noted. Minimal chronic ischemic white matter disease is noted. No mass effect or midline shift is noted. Ventricular size is within normal limits. There is no evidence of mass lesion, hemorrhage or acute infarction.  IMPRESSION: Minimal diffuse cortical atrophy. Minimal chronic ischemic white matter disease. No acute intracranial abnormality seen.   Electronically Signed   By: Sabino Dick M.D.   On: 01/04/2014 15:20   Mr Virgel Paling Wo Contrast  01/04/2014   CLINICAL DATA:  58 year old male with new onset dizziness and right facial numbness upon waking. Initial encounter.  EXAM: MRI HEAD WITHOUT CONTRAST  MRA HEAD WITHOUT CONTRAST  TECHNIQUE: Multiplanar, multiecho pulse sequences of the brain and surrounding structures were obtained without intravenous contrast. Angiographic images of the head were obtained using MRA technique without contrast.  COMPARISON:  Neck MRA 08/27/2006.  FINDINGS: MRI HEAD FINDINGS  No restricted diffusion to suggest acute  infarction. No midline shift, mass effect, evidence of mass lesion, ventriculomegaly, extra-axial collection or acute intracranial hemorrhage. Cervicomedullary junction and pituitary are within normal limits. Grossly negative visualized cervical spine.  Major intracranial vascular flow voids are preserved, there is a degree of intracranial artery dolichoectasia, especially at the vertebrobasilar junction.  Scattered small and patchy areas of cerebral white matter T2 and FLAIR hyperintensity. Configuration is nonspecific. Minimal involvement of the temporal lobes. Patchy more confluent T2 and FLAIR hyperintensity in the pons. Mild T2 heterogeneity in the deep gray matter nuclei. No cortical encephalomalacia. Cerebellum within normal limits.  Visualized orbit soft tissues are within  normal limits. Visualized paranasal sinuses and mastoids are clear. Normal bone marrow signal. Visualized scalp soft tissues are within normal limits.  Visible internal auditory structures appear grossly normal, except for some loss of T2 signal in the left IAC (series 4, image 6).  MRA HEAD FINDINGS  Dominant distal left vertebral artery. Antegrade flow in the posterior circulation. Dolichoectatic, tortuous vertebrobasilar junction. Normal PICA origins. Tortuous proximal basilar artery. No basilar stenosis. Normal PCA origins. Normal AICA origins. Posterior communicating arteries are diminutive or absent. Bilateral PCA branches are within normal limits.  Antegrade flow in both ICA siphons. Tortuous distal cervical ICAs. ICA siphon irregularity in keeping with atherosclerosis, but no siphon stenosis identified. Ophthalmic artery origins are within normal limits. Patent carotid termini. Normal MCA and ACA origins. Anterior communicating artery and visualized bilateral ACA branches are within normal limits. Visualized bilateral MCA branches are within normal limits.  IMPRESSION: 1.  No acute intracranial abnormality. 2. Moderate for age nonspecific signal changes in the brain. Superimposed intracranial artery dolichoectasia favors chronic hypertensive or small vessel disease. 3. Otherwise negative intracranial MRA. 4. Real versus artifactual loss of CSF signal in the left internal auditory canal. If dizziness persists or is accompanied by left side hearing loss, then recommend followup dedicated IAC brain MRI without and with contrast.   Electronically Signed   By: Lars Pinks M.D.   On: 01/04/2014 15:24   Mr Brain Wo Contrast  01/04/2014   CLINICAL DATA:  58 year old male with new onset dizziness and right facial numbness upon waking. Initial encounter.  EXAM: MRI HEAD WITHOUT CONTRAST  MRA HEAD WITHOUT CONTRAST  TECHNIQUE: Multiplanar, multiecho pulse sequences of the brain and surrounding structures were obtained  without intravenous contrast. Angiographic images of the head were obtained using MRA technique without contrast.  COMPARISON:  Neck MRA 08/27/2006.  FINDINGS: MRI HEAD FINDINGS  No restricted diffusion to suggest acute infarction. No midline shift, mass effect, evidence of mass lesion, ventriculomegaly, extra-axial collection or acute intracranial hemorrhage. Cervicomedullary junction and pituitary are within normal limits. Grossly negative visualized cervical spine.  Major intracranial vascular flow voids are preserved, there is a degree of intracranial artery dolichoectasia, especially at the vertebrobasilar junction.  Scattered small and patchy areas of cerebral white matter T2 and FLAIR hyperintensity. Configuration is nonspecific. Minimal involvement of the temporal lobes. Patchy more confluent T2 and FLAIR hyperintensity in the pons. Mild T2 heterogeneity in the deep gray matter nuclei. No cortical encephalomalacia. Cerebellum within normal limits.  Visualized orbit soft tissues are within normal limits. Visualized paranasal sinuses and mastoids are clear. Normal bone marrow signal. Visualized scalp soft tissues are within normal limits.  Visible internal auditory structures appear grossly normal, except for some loss of T2 signal in the left IAC (series 4, image 6).  MRA HEAD FINDINGS  Dominant distal left vertebral artery. Antegrade flow in the posterior circulation. Dolichoectatic,  tortuous vertebrobasilar junction. Normal PICA origins. Tortuous proximal basilar artery. No basilar stenosis. Normal PCA origins. Normal AICA origins. Posterior communicating arteries are diminutive or absent. Bilateral PCA branches are within normal limits.  Antegrade flow in both ICA siphons. Tortuous distal cervical ICAs. ICA siphon irregularity in keeping with atherosclerosis, but no siphon stenosis identified. Ophthalmic artery origins are within normal limits. Patent carotid termini. Normal MCA and ACA origins. Anterior  communicating artery and visualized bilateral ACA branches are within normal limits. Visualized bilateral MCA branches are within normal limits.  IMPRESSION: 1.  No acute intracranial abnormality. 2. Moderate for age nonspecific signal changes in the brain. Superimposed intracranial artery dolichoectasia favors chronic hypertensive or small vessel disease. 3. Otherwise negative intracranial MRA. 4. Real versus artifactual loss of CSF signal in the left internal auditory canal. If dizziness persists or is accompanied by left side hearing loss, then recommend followup dedicated IAC brain MRI without and with contrast.   Electronically Signed   By: Lars Pinks M.D.   On: 01/04/2014 15:24   US Carotid Duplex Bilateral  01/04/2014   CLINICAL DATA:  Dizziness.  Carotid artery disease.  EXAM: BILATERAL CAROTID DUPLEX ULTRASOUND  TECHNIQUE: Pearline Cables scale imaging, color Doppler and duplex ultrasound were performed of bilateral carotid and vertebral arteries in the neck.  COMPARISON:  12/11/2011 and 05/05/2013  FINDINGS: Criteria: Quantification of carotid stenosis is based on velocity parameters that correlate the residual internal carotid diameter with NASCET-based stenosis levels, using the diameter of the distal internal carotid lumen as the denominator for stenosis measurement.  The following velocity measurements were obtained:  RIGHT  ICA:  83 cm/sec  CCA:  76 cm/sec  SYSTOLIC ICA/CCA RATIO:  1.1  DIASTOLIC ICA/CCA RATIO:  1.1  ECA:  168 cm/sec  LEFT  ICA:  108 cm/sec  CCA:  80 cm/sec  SYSTOLIC ICA/CCA RATIO:  1.3  DIASTOLIC ICA/CCA RATIO:  1.2  ECA:  49 cm/sec  RIGHT CAROTID ARTERY: Echogenic plaque at the right carotid bulb. Plaque in the proximal internal and external carotid arteries. Heterogeneous plaque in the proximal internal carotid artery. No significant stenosis the right internal carotid artery based on the waveforms and velocity measurements.  RIGHT VERTEBRAL ARTERY: Antegrade flow and normal waveform in the  right vertebral artery.  LEFT CAROTID ARTERY: Echogenic plaque at the carotid bulb and involving the proximal internal and external carotid arteries. No significant stenosis in the internal carotid artery based on the waveforms and velocities.  LEFT VERTEBRAL ARTERY: Antegrade flow and normal waveform in the left vertebral artery.  IMPRESSION: Atherosclerotic disease in the carotid arteries bilaterally. Estimated degree of stenosis in the internal carotid arteries is less than 50% bilaterally. Peak systolic velocities have decreased compared to the previous examinations, particularly on the left side. Recommend continued carotid artery surveillance. Alternatively, the degree of stenosis in the carotid arteries may be better characterized with CTA or MRA.  Patent vertebral arteries.   Electronically Signed   By: Markus Daft M.D.   On: 01/04/2014 17:55    Microbiology: No results found for this or any previous visit (from the past 240 hour(s)).   Labs: Basic Metabolic Panel:  Recent Labs Lab 01/04/14 1250  NA 140  K 3.6*  CL 100  CO2 25  GLUCOSE 178*  BUN 17  CREATININE 0.82  CALCIUM 9.8   Liver Function Tests:  Recent Labs Lab 01/04/14 1250  AST 20  ALT 26  ALKPHOS 103  BILITOT 0.3  PROT 7.4  ALBUMIN 4.0  No results for input(s): LIPASE, AMYLASE in the last 168 hours. No results for input(s): AMMONIA in the last 168 hours. CBC:  Recent Labs Lab 01/04/14 1250  WBC 8.3  HGB 15.9  HCT 44.7  MCV 93.1  PLT 310   Cardiac Enzymes:  Recent Labs Lab 01/04/14 1250  TROPONINI <0.30   BNP: BNP (last 3 results) No results for input(s): PROBNP in the last 8760 hours. CBG:  Recent Labs Lab 01/04/14 1808 01/04/14 2014 01/05/14 0746 01/05/14 1116  GLUCAP 181* 261* 254* 221*       Signed:  Dyanne Carrel M  [facility] 01/05/2014, 12:46 PM

## 2014-01-05 NOTE — Progress Notes (Signed)
Inpatient Diabetes Program Recommendations  AACE/ADA: New Consensus Statement on Inpatient Glycemic Control (2013)  Target Ranges:  Prepandial:   less than 140 mg/dL      Peak postprandial:   less than 180 mg/dL (1-2 hours)      Critically ill patients:  140 - 180 mg/dL   Results for RIOT, WATERWORTH (MRN [id number]) as of 01/05/2014 11:43  Ref. Range 01/04/2014 18:08 01/04/2014 20:14 01/05/2014 07:46 01/05/2014 11:16  Glucose-Capillary Latest Range: 70-99 mg/dL 181 (H) 261 (H) 254 (H) 221 (H)   Diabetes history: DM2 Outpatient Diabetes medications: Toujeo 10 units QHS Current orders for Inpatient glycemic control: Lantus 10 units QHS, Novolog 0-15 units AC, Novolog 0-5 units HS  Inpatient Diabetes Program Recommendations Insulin - Basal: Please consider increasing Lantus to 15 units QHS.  Thanks, Barnie Alderman, RN, MSN, CCRN, CDE Diabetes Coordinator Inpatient Diabetes Program [phone] (Team Pager) [phone] ([facility] office) [phone] [facility] office)

## 2014-01-05 NOTE — Plan of Care (Signed)
Problem: Acute Treatment Outcomes Goal: BP within ordered parameters Outcome: Completed/Met Date Met:  01/05/14 Goal: Airway maintained/protected Outcome: Completed/Met Date Met:  01/05/14 Goal: 02 Sats > 94% Outcome: Completed/Met Date Met:  01/05/14

## 2014-01-05 NOTE — Progress Notes (Signed)
Patient's IV was removed and was clean, dry, and intact at removal.  Patient and spouse received discharge instructions and scripts and had no further questions/concerns.  A follow-up appointment was scheduled with neurologist and patient instructed to call his office prior to appointment.  Patient was escorted to vehicle via wheelchair by patient advocate.  Patient in stable condition at discharge.

## 2014-01-05 NOTE — Progress Notes (Signed)
Patient's NPO order has expired.  Notified MD for diet order.  Will continue to monitor patient.

## 2014-01-05 NOTE — Plan of Care (Signed)
Problem: Acute Treatment Outcomes Goal: Neuro exam at baseline or improved Outcome: Completed/Met Date Met:  01/05/14     

## 2014-01-05 NOTE — Evaluation (Signed)
Physical Therapy Evaluation Patient Details Name: Jimmy Duran MRN: [id number] DOB: [birth year] Today's Date: 01/05/2014   History of Present Illness  This is a 58 year old man who has hypertension, diabetes, hyperlipidemia and is a cigarette smoker, who presents with a 2-3 week history of dizziness. 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  Clinical Impression  Pt is Independent with all mobility. Pt did not show signs of nystagmus with VOR testing and did not c/o nausea or dizziness. Pt does not have a skilled PT need and d/c from PT services.    Follow Up Recommendations No PT follow up    Equipment Recommendations  None recommended by PT    Recommendations for Other Services       Precautions / Restrictions Precautions Precautions: None Restrictions Weight Bearing Restrictions: No      Mobility  Bed Mobility Overal bed mobility: Independent                Transfers Overall transfer level: Independent                  Ambulation/Gait Ambulation/Gait assistance: Independent Ambulation Distance (Feet): 200 Feet Assistive device: None Gait Pattern/deviations: WFL(Within Functional Limits)   Gait velocity interpretation: at or above normal speed for age/gender    Stairs            Wheelchair Mobility    Modified Rankin (Stroke Patients Only)       Balance Overall balance assessment: No apparent balance deficits (not formally assessed)                                           Pertinent Vitals/Pain Pain Assessment: No/denies pain    Home Living Family/patient expects to be discharged to:: Private residence Living Arrangements: Spouse/significant other Available Help at Discharge: Family Type of Home: House Home Access: Stairs to enter Entrance Stairs-Rails: None Technical brewer of Steps: 2 Home Layout: One level Home Equipment: None      Prior Function Level of  Independence: Independent               Hand Dominance        Extremity/Trunk Assessment   Upper Extremity Assessment: Overall WFL for tasks assessed           Lower Extremity Assessment: Overall WFL for tasks assessed      Cervical / Trunk Assessment: Normal  Communication   Communication: No difficulties  Cognition Arousal/Alertness: Awake/alert Behavior During Therapy: WFL for tasks assessed/performed Overall Cognitive Status: Within Functional Limits for tasks assessed                      General Comments General comments (skin integrity, edema, etc.): checked VOR reflex and no nystagmus noted and no c/o dizziness.    Exercises        Assessment/Plan    PT Assessment Patent does not need any further PT services  PT Diagnosis     PT Problem List    PT Treatment Interventions     PT Goals (Current goals can be found in the Care Plan section)      Frequency     Barriers to discharge        Co-evaluation               End of Session   Activity Tolerance: Patient  tolerated treatment well Patient left: in bed;with call bell/phone within reach;with family/visitor present Nurse Communication: Mobility status (pt is independent)    Functional Assessment Tool Used: clonical observation Functional Limitation: Mobility: Walking and moving around Mobility: Walking and Moving Around Current Status ([id number]): 0 percent impaired, limited or restricted Mobility: Walking and Moving Around Goal Status [phone]): 0 percent impaired, limited or restricted Mobility: Walking and Moving Around Discharge Status [phone]): 0 percent impaired, limited or restricted    Time: 9233-0076 PT Time Calculation (min): 17 min   Charges:   PT Evaluation $Initial PT Evaluation Tier I: 1 Procedure     PT G Codes:   Functional Assessment Tool Used: clonical observation Functional Limitation: Mobility: Walking and moving around    [place], Colon Flattery 01/05/2014, 11:42 AM

## 2014-03-24 ENCOUNTER — Emergency Department (HOSPITAL_COMMUNITY): Payer: BLUE CROSS/BLUE SHIELD

## 2014-03-24 ENCOUNTER — Emergency Department (HOSPITAL_COMMUNITY)
Admission: EM | Admit: 2014-03-24 | Discharge: 2014-03-24 | Disposition: A | Discharge status: Home / Self Care | Payer: BLUE CROSS/BLUE SHIELD | Attending: Emergency Medicine | Admitting: Emergency Medicine

## 2014-03-24 ENCOUNTER — Encounter (HOSPITAL_COMMUNITY): Payer: Self-pay

## 2014-03-24 DIAGNOSIS — M19011 Primary osteoarthritis, right shoulder: Secondary | ICD-10-CM | POA: Diagnosis not present

## 2014-03-24 DIAGNOSIS — Z794 Long term (current) use of insulin: Secondary | ICD-10-CM | POA: Diagnosis not present

## 2014-03-24 DIAGNOSIS — Z79899 Other long term (current) drug therapy: Secondary | ICD-10-CM | POA: Diagnosis not present

## 2014-03-24 DIAGNOSIS — Z7982 Long term (current) use of aspirin: Secondary | ICD-10-CM | POA: Insufficient documentation

## 2014-03-24 DIAGNOSIS — Z8673 Personal history of transient ischemic attack (TIA), and cerebral infarction without residual deficits: Secondary | ICD-10-CM | POA: Diagnosis not present

## 2014-03-24 DIAGNOSIS — J449 Chronic obstructive pulmonary disease, unspecified: Secondary | ICD-10-CM | POA: Diagnosis not present

## 2014-03-24 DIAGNOSIS — I10 Essential (primary) hypertension: Secondary | ICD-10-CM | POA: Insufficient documentation

## 2014-03-24 DIAGNOSIS — E782 Mixed hyperlipidemia: Secondary | ICD-10-CM | POA: Insufficient documentation

## 2014-03-24 DIAGNOSIS — Z720 Tobacco use: Secondary | ICD-10-CM | POA: Insufficient documentation

## 2014-03-24 DIAGNOSIS — K219 Gastro-esophageal reflux disease without esophagitis: Secondary | ICD-10-CM | POA: Insufficient documentation

## 2014-03-24 DIAGNOSIS — E119 Type 2 diabetes mellitus without complications: Secondary | ICD-10-CM | POA: Insufficient documentation

## 2014-03-24 DIAGNOSIS — R52 Pain, unspecified: Secondary | ICD-10-CM

## 2014-03-24 DIAGNOSIS — I2510 Atherosclerotic heart disease of native coronary artery without angina pectoris: Secondary | ICD-10-CM | POA: Insufficient documentation

## 2014-03-24 DIAGNOSIS — M25511 Pain in right shoulder: Secondary | ICD-10-CM | POA: Diagnosis present

## 2014-03-24 DIAGNOSIS — Z86010 Personal history of colonic polyps: Secondary | ICD-10-CM | POA: Insufficient documentation

## 2014-03-24 DIAGNOSIS — Z7952 Long term (current) use of systemic steroids: Secondary | ICD-10-CM | POA: Diagnosis not present

## 2014-03-24 MED ORDER — DEXAMETHASONE 4 MG PO TABS: 4.0000 mg | ORAL_TABLET | Freq: Two times a day (BID) | ORAL | Status: DC

## 2014-03-24 MED ORDER — CELECOXIB 100 MG PO CAPS: 100.0000 mg | ORAL_CAPSULE | Freq: Two times a day (BID) | ORAL | Status: DC

## 2014-03-24 MED ORDER — PREDNISONE 50 MG PO TABS
60.0000 mg | ORAL_TABLET | Freq: Once | ORAL | Status: AC
  Administered 2014-03-24: 60 mg via ORAL
  Filled 2014-03-24 (×2): qty 1

## 2014-03-24 MED ORDER — ONDANSETRON HCL 4 MG PO TABS
4.0000 mg | ORAL_TABLET | Freq: Once | ORAL | Status: AC
  Administered 2014-03-24: 4 mg via ORAL
  Filled 2014-03-24: qty 1

## 2014-03-24 MED ORDER — INDOMETHACIN 25 MG PO CAPS
25.0000 mg | ORAL_CAPSULE | Freq: Once | ORAL | Status: AC
  Administered 2014-03-24: 25 mg via ORAL
  Filled 2014-03-24: qty 1

## 2014-03-24 MED ORDER — ACETAMINOPHEN-CODEINE #3 300-30 MG PO TABS: 1.0000 | ORAL_TABLET | Freq: Four times a day (QID) | ORAL | Status: DC | PRN

## 2014-03-24 NOTE — ED Notes (Signed)
Pain rt shoulder for "couple of months" No known injury.  Good rt radial pulse and sensation.  Increased pain with movement.

## 2014-03-24 NOTE — ED Provider Notes (Signed)
CSN: [id number]     Arrival date & time 03/24/14  1045 History   First MD Initiated Contact with Patient 03/24/14 1143     Chief Complaint  Patient presents with  . Shoulder Pain     (Consider location/radiation/quality/duration/timing/severity/associated sxs/prior Treatment) Patient is a 59 y.o. male presenting with shoulder pain. The history is provided by the patient.  Shoulder Pain Location:  Shoulder Time since incident:  2 months Injury: no   Shoulder location:  R shoulder Pain details:    Quality:  Aching and shooting   Severity:  Moderate   Onset quality:  Gradual   Duration:  2 months   Timing:  Intermittent   Progression:  Worsening Chronicity:  Chronic Handedness:  Right-handed Dislocation: no   Foreign body present:  No foreign bodies Prior injury to area:  Unable to specify Relieved by:  Nothing Worsened by:  Movement Ineffective treatments:  Acetaminophen Associated symptoms: decreased range of motion   Associated symptoms: no back pain and no neck pain   Risk factors: no known bone disorder and no frequent fractures     Past Medical History  Diagnosis Date  . Coronary artery disease     Nonobstructive  . Type 2 diabetes mellitus   . Mixed hyperlipidemia   . Essential hypertension, benign   . Carotid artery disease   . COPD (chronic obstructive pulmonary disease)   . GERD (gastroesophageal reflux disease)   . Hemorrhoids   . Diabetes mellitus   . Stroke     "ministrokes"  . Arthritis   . Colon polyp    Past Surgical History  Procedure Laterality Date  . Appendectomy  1970  . Cholecystectomy  1993  . Umbilical hernia repair  1993  . Breast surgery      Cyst resection on the right  . Knee surgery      Left knee arthroscopy torn medial meniscus grade 4 chondral changes medial femoral condyle tibial plateau  . Total knee arthroplasty Left 04/27/2012    Procedure: TOTAL KNEE ARTHROPLASTY;  Surgeon: Carole Civil, MD;  Location: [facility];   Service: Orthopedics;  Laterality: Left;  . Colonoscopy  02/12/2007    [id number] friable anal canal hemorrhoids, otherwise normal rectum and colon  . Colonoscopy N/A 07/16/2012    Procedure: COLONOSCOPY;  Surgeon: [name], MD;  Location: [facility];  Service: Endoscopy;  Laterality: N/A;  1:15-moved to [place] to notify pt  . Esophagogastroduodenoscopy (egd) with esophageal dilation N/A 07/16/2012    Procedure: ESOPHAGOGASTRODUODENOSCOPY (EGD) WITH ESOPHAGEAL DILATION;  Surgeon: [name], MD;  Location: [facility];  Service: Endoscopy;  Laterality: N/A;   Family History  Problem Relation Age of Onset  . Coronary artery disease Father     CABG in his 76's  . Colon cancer Other     maternal great aunt  . Colon cancer Other     paternal great aunt  . Colon polyps Mother    History  Substance Use Topics  . Smoking status: Current Every Day Smoker -- 0.50 packs/day for 30 years    Types: Cigarettes  . Smokeless tobacco: Current User    Types: Chew     Comment: vapor cig. 4-5 ciggs per day  . Alcohol Use: Yes     Comment: Socially    Review of Systems  Constitutional: Negative for activity change.       All ROS Neg except as noted in HPI  HENT: Negative for nosebleeds.  Eyes: Negative for photophobia and discharge.  Respiratory: Negative for cough, shortness of breath and wheezing.   Cardiovascular: Negative for chest pain and palpitations.  Gastrointestinal: Negative for abdominal pain and blood in stool.  Genitourinary: Negative for dysuria, frequency and hematuria.  Musculoskeletal: Positive for arthralgias. Negative for back pain and neck pain.  Skin: Negative.   Neurological: Negative for dizziness, seizures and speech difficulty.  Psychiatric/Behavioral: Negative for hallucinations and confusion.      Allergies  Review of patient's allergies indicates no known allergies.  Home Medications   Prior to Admission medications   Medication Sig  Start Date End Date Taking? Authorizing Provider  acetaminophen-codeine (TYLENOL #3) 300-30 MG per tablet Take 1-2 tablets by mouth every 6 (six) hours as needed. 03/24/14   Lenox Ahr, PA-C  aspirin 325 MG tablet Take 1 tablet (325 mg total) by mouth daily. 01/05/14   Kathie Dike, MD  celecoxib (CELEBREX) 100 MG capsule Take 1 capsule (100 mg total) by mouth 2 (two) times daily. 03/24/14   Lenox Ahr, PA-C  dexamethasone (DECADRON) 4 MG tablet Take 1 tablet (4 mg total) by mouth 2 (two) times daily. 03/24/14   Lenox Ahr, PA-C  fish oil-omega-3 fatty acids 1000 MG capsule Take 2 g by mouth 2 (two) times daily.     Historical Provider, MD  HYDROcodone-acetaminophen (NORCO/VICODIN) 5-325 MG per tablet Take 1 tablet by mouth every 4 (four) hours as needed. Patient not taking: Reported on 01/04/2014 12/25/13   Lenox Ahr, PA-C  hydrOXYzine (ATARAX/VISTARIL) 25 MG tablet 1 at hs, or q6h prn redness and swelling. Patient not taking: Reported on 01/04/2014 12/25/13   Lenox Ahr, PA-C  Insulin Glargine (TOUJEO SOLOSTAR) 300 UNIT/ML SOPN Inject 10 Units into the skin at bedtime.     Historical Provider, MD  lisinopril-hydrochlorothiazide (PRINZIDE,ZESTORETIC) 20-12.5 MG per tablet Take 1 tablet by mouth every morning. 12/09/13   Satira Sark, MD  metoprolol tartrate (LOPRESSOR) 25 MG tablet Take 25 mg by mouth 2 (two) times daily.    Historical Provider, MD  niacin (NIASPAN) 500 MG CR tablet Take 1 tablet (500 mg total) by mouth at bedtime. 01/05/14   Kathie Dike, MD  pantoprazole (PROTONIX) 40 MG tablet Take 40 mg by mouth every morning.    Historical Provider, MD  pregabalin (LYRICA) 75 MG capsule Take 75 mg by mouth at bedtime.    Historical Provider, MD  simvastatin (ZOCOR) 40 MG tablet Take 1 tablet (40 mg total) by mouth every morning. 12/09/13   Satira Sark, MD   BP 132/89 mmHg  Pulse 77  Temp(Src) 98.8 F (37.1 C) (Oral)  Resp 16  Ht 5\' 7"  (1.702 m)  Wt 160 lb  (72.576 kg)  BMI 25.05 kg/m2  SpO2 98% Physical Exam  Constitutional: He is oriented to person, place, and time. He appears well-developed and well-nourished.  Non-toxic appearance.  HENT:  Head: Normocephalic.  Right Ear: Tympanic membrane and external ear normal.  Left Ear: Tympanic membrane and external ear normal.  Eyes: EOM and lids are normal. Pupils are equal, round, and reactive to light.  Neck: Normal range of motion. Neck supple. Carotid bruit is not present.  Cardiovascular: Normal rate, regular rhythm, normal heart sounds, intact distal pulses and normal pulses.   Pulmonary/Chest: Breath sounds normal. No respiratory distress.  Abdominal: Soft. Bowel sounds are normal. There is no tenderness. There is no guarding.  Musculoskeletal:       Right shoulder: He exhibits  decreased range of motion, tenderness, bony tenderness, crepitus and pain. He exhibits no effusion and no deformity.  Lymphadenopathy:       Head (right side): No submandibular adenopathy present.       Head (left side): No submandibular adenopathy present.    He has no cervical adenopathy.  Neurological: He is alert and oriented to person, place, and time. He has normal strength. No cranial nerve deficit or sensory deficit.  Skin: Skin is warm and dry.  Psychiatric: He has a normal mood and affect. His speech is normal.  Nursing note and vitals reviewed.   ED Course  Procedures (including critical care time) Labs Review Labs Reviewed - No data to display  Imaging Review Dg Shoulder Right  03/24/2014   CLINICAL DATA:  Initial encounter for pain of a few months duration. No trauma.  EXAM: RIGHT SHOULDER - 2+ VIEW  COMPARISON:  None  FINDINGS: There is no fracture, dislocation or radiopaque foreign body. No significant arthropathic changes are evident. There is no bone lesion or bony destruction.  IMPRESSION: Negative.   Electronically Signed   By: Andreas Newport M.D.   On: 03/24/2014 12:12     EKG  Interpretation None      MDM  Patient has a history of arthritis. Patient acknowledges that he overuses the right hand compensating because he already has problems with the left shoulder. The patient has not had any recent trauma to the shoulder. There is crepitus with range of motion and decreased range of motion. The x-ray is negative for fracture or dislocation area suspect the patient has some overuse, and exacerbation of the arthritis involving the shoulder. The patient is placed in a sling, prescription for Norco, Decadron, and Celebrex given to the patient. Patient referred to orthopedics.    Final diagnoses:  Primary osteoarthritis of right shoulder    **I have reviewed nursing notes, vital signs, and all appropriate lab and imaging results for this patient.Lenox Ahr, PA-C 03/24/14 2121  Sharyon Cable, MD 03/25/14 [phone]

## 2014-03-24 NOTE — ED Notes (Signed)
Pt c/o pain in r shoulder for " a while"  Reports pain flared up this morning.  Says is worse with movement.

## 2014-03-24 NOTE — Discharge Instructions (Signed)
Your xray is negative for fracture or dislocation. Use celebrex and decadron 2 times daily with food. Use tylenol codeine every 4 to 6 hours if needed for pain. This medication may cause drowsiness, use with caution. Do not take tylenol codeine on an empty stomach. See Dr Nevada Crane soon for recheck and management. Osteoarthritis Osteoarthritis is a disease that causes soreness and inflammation of a joint. It occurs when the cartilage at the affected joint wears down. Cartilage acts as a cushion, covering the ends of bones where they meet to form a joint. Osteoarthritis is the most common form of arthritis. It often occurs in older people. The joints affected most often by this condition include those in the:  Ends of the fingers.  Thumbs.  Neck.  Lower back.  Knees.  Hips. CAUSES  Over time, the cartilage that covers the ends of bones begins to wear away. This causes bone to rub on bone, producing pain and stiffness in the affected joints.  RISK FACTORS Certain factors can increase your chances of having osteoarthritis, including:  Older age.  Excessive body weight.  Overuse of joints.  Previous joint injury. SIGNS AND SYMPTOMS   Pain, swelling, and stiffness in the joint.  Over time, the joint may lose its normal shape.  Small deposits of bone (osteophytes) may grow on the edges of the joint.  Bits of bone or cartilage can break off and float inside the joint space. This may cause more pain and damage. DIAGNOSIS  Your health care provider will do a physical exam and ask about your symptoms. Various tests may be ordered, such as:  X-rays of the affected joint.  An MRI scan.  Blood tests to rule out other types of arthritis.  Joint fluid tests. This involves using a needle to draw fluid from the joint and examining the fluid under a microscope. TREATMENT  Goals of treatment are to control pain and improve joint function. Treatment plans may include:  A prescribed exercise  program that allows for rest and joint relief.  A weight control plan.  Pain relief techniques, such as:  Properly applied heat and cold.  Electric pulses delivered to nerve endings under the skin (transcutaneous electrical nerve stimulation [TENS]).  Massage.  Certain nutritional supplements.  Medicines to control pain, such as:  Acetaminophen.  Nonsteroidal anti-inflammatory drugs (NSAIDs), such as naproxen.  Narcotic or central-acting agents, such as tramadol.  Corticosteroids. These can be given orally or as an injection.  Surgery to reposition the bones and relieve pain (osteotomy) or to remove loose pieces of bone and cartilage. Joint replacement may be needed in advanced states of osteoarthritis. HOME CARE INSTRUCTIONS   Take medicines only as directed by your health care provider.  Maintain a healthy weight. Follow your health care provider's instructions for weight control. This may include dietary instructions.  Exercise as directed. Your health care provider can recommend specific types of exercise. These may include:  Strengthening exercises. These are done to strengthen the muscles that support joints affected by arthritis. They can be performed with weights or with exercise bands to add resistance.  Aerobic activities. These are exercises, such as brisk walking or low-impact aerobics, that get your heart pumping.  Range-of-motion activities. These keep your joints limber.  Balance and agility exercises. These help you maintain daily living skills.  Rest your affected joints as directed by your health care provider.  Keep all follow-up visits as directed by your health care provider. SEEK MEDICAL CARE IF:  Your skin turns red.  You develop a rash in addition to your joint pain.  You have worsening joint pain.  You have a fever along with joint or muscle aches. SEEK IMMEDIATE MEDICAL CARE IF:  You have a significant loss of weight or  appetite.  You have night sweats. [place] of Arthritis and Musculoskeletal and Skin Diseases: www.niams.SouthExposed.es  [employer] on Aging: http://kim-miller.com/  American College of Rheumatology: www.rheumatology.org Document Released: 02/18/2005 Document Revised: 07/05/2013 Document Reviewed: 10/26/2012 [facility] Patient Information 2015 [place], [place]. This information is not intended to replace advice given to you by your health care provider. Make sure you discuss any questions you have with your health care provider.

## 2014-06-29 ENCOUNTER — Ambulatory Visit (INDEPENDENT_AMBULATORY_CARE_PROVIDER_SITE_OTHER): Payer: BLUE CROSS/BLUE SHIELD | Admitting: Physician Assistant

## 2014-06-29 ENCOUNTER — Encounter: Payer: Self-pay | Admitting: Physician Assistant

## 2014-06-29 VITALS — BP 138/96 | HR 84 | Ht 66.0 in | Wt 167.0 lb

## 2014-06-29 DIAGNOSIS — I2510 Atherosclerotic heart disease of native coronary artery without angina pectoris: Secondary | ICD-10-CM

## 2014-06-29 DIAGNOSIS — F17200 Nicotine dependence, unspecified, uncomplicated: Secondary | ICD-10-CM

## 2014-06-29 DIAGNOSIS — I10 Essential (primary) hypertension: Secondary | ICD-10-CM

## 2014-06-29 DIAGNOSIS — Z720 Tobacco use: Secondary | ICD-10-CM | POA: Diagnosis not present

## 2014-06-29 NOTE — Progress Notes (Signed)
Cardiology Office Note   Date:  06/29/2014   ID:  Jimmy Duran, DOB 1955/03/11, MRN [id number]  PCP:  Delphina Cahill, MD  Cardiologist:  Dr. Johnny Bridge  Chief Complaint:    History of Present Illness: Jimmy Duran is a 59 y.o. male who presents for routine follow-up. He has a history of nonobstructive CAD by remote cath, hypertension, hyperlipidemia, DM 2, carotid stenosis and prior CVA. He had a negative Lexi scan Myoview in 04/2013. He had a TIA in 01/2014. He was to follow-up with neurology. Aspirin was increased. 2-D echo 04/2013 showed normal LV function. Carotid Dopplers in 01/2014 need continued surveillance. See below for details.  Patient comes in today for yearly follow-up. He denies any chest pain, palpitations, dyspnea, dyspnea on exertion, dizziness or presyncope. He recently had breast pain and was started on hormones by Dr. Nevada Duran which helped. He has follow-up with him next week. Patient continues to smoke about 10 cigarettes daily.  Past Medical History  Diagnosis Date  . Coronary artery disease     Nonobstructive  . Type 2 diabetes mellitus   . Mixed hyperlipidemia   . Essential hypertension, benign   . Carotid artery disease   . COPD (chronic obstructive pulmonary disease)   . GERD (gastroesophageal reflux disease)   . Hemorrhoids   . Diabetes mellitus   . Stroke     "ministrokes"  . Arthritis   . Colon polyp     Past Surgical History  Procedure Laterality Date  . Appendectomy  1970  . Cholecystectomy  1993  . Umbilical hernia repair  1993  . Breast surgery      Cyst resection on the right  . Knee surgery      Left knee arthroscopy torn medial meniscus grade 4 chondral changes medial femoral condyle tibial plateau  . Total knee arthroplasty Left 04/27/2012    Procedure: TOTAL KNEE ARTHROPLASTY;  Surgeon: Carole Civil, MD;  Location: [facility];  Service: Orthopedics;  Laterality: Left;  . Colonoscopy  02/12/2007    [id number] friable anal canal  hemorrhoids, otherwise normal rectum and colon  . Colonoscopy N/A 07/16/2012    Procedure: COLONOSCOPY;  Surgeon: [name], MD;  Location: [facility];  Service: Endoscopy;  Laterality: N/A;  1:15-moved to [place] to notify pt  . Esophagogastroduodenoscopy (egd) with esophageal dilation N/A 07/16/2012    Procedure: ESOPHAGOGASTRODUODENOSCOPY (EGD) WITH ESOPHAGEAL DILATION;  Surgeon: [name], MD;  Location: [facility];  Service: Endoscopy;  Laterality: N/A;     Current Outpatient Prescriptions  Medication Sig Dispense Refill  . aspirin 325 MG tablet Take 1 tablet (325 mg total) by mouth daily. 30 tablet 1  . fenofibrate 160 MG tablet     . fish oil-omega-3 fatty acids 1000 MG capsule Take 2 g by mouth 2 (two) times daily.     . hydrOXYzine (ATARAX/VISTARIL) 25 MG tablet 1 at hs, or q6h prn redness and swelling. 12 tablet 0  . Insulin Glargine (TOUJEO SOLOSTAR) 300 UNIT/ML SOPN Inject 25 Units into the skin at bedtime.     Marland Kitchen lisinopril-hydrochlorothiazide (PRINZIDE,ZESTORETIC) 20-12.5 MG per tablet Take 1 tablet by mouth every morning. 90 tablet 3  . metFORMIN (GLUCOPHAGE) 1000 MG tablet     . metoprolol tartrate (LOPRESSOR) 25 MG tablet Take 25 mg by mouth 2 (two) times daily.    . niacin (NIASPAN) 500 MG CR tablet Take 1 tablet (500 mg total) by mouth at bedtime. 30 tablet 1  .  pantoprazole (PROTONIX) 40 MG tablet Take 40 mg by mouth every morning.    . pregabalin (LYRICA) 75 MG capsule Take 75 mg by mouth at bedtime.    . simvastatin (ZOCOR) 40 MG tablet Take 1 tablet (40 mg total) by mouth every morning. 90 tablet 3   No current facility-administered medications for this visit.    Allergies:   Review of patient's allergies indicates no known allergies.    Social History:  The patient  reports that he has been smoking Cigarettes.  He has a 15 pack-year smoking history. His smokeless tobacco use includes Chew. He reports that he drinks alcohol. He reports that he  does not use illicit drugs.   Family History:  The patient's family history includes Colon cancer in his other and other; Colon polyps in his mother; Coronary artery disease in his father.    ROS:  Please see the history of present illness.   Otherwise, review of systems are positive for none.   All other systems are reviewed and negative.    PHYSICAL EXAM: VS:  BP 138/96 mmHg  Pulse 84  Ht 5\' 6"  (1.676 m)  Wt 167 lb (75.751 kg)  BMI 26.97 kg/m2  SpO2 98% , BMI Body mass index is 26.97 kg/(m^2). GEN: Well nourished, well developed, in no acute distress, smells of cigarette smoke Neck: Bilateral carotid bruits no JVD, HJR, or masses Cardiac: RRR; 2/6 systolic murmur at the left sternal border, no rubs, thrill or heave,  Respiratory:  Decreased breath sounds but clear to auscultation bilaterally, normal work of breathing GI: soft, nontender, nondistended, + BS MS: no deformity or atrophy Extremities: without cyanosis, clubbing, edema, good distal pulses bilaterally.  Skin: warm and dry, no rash Neuro:  Strength and sensation are intact    EKG:  EKG is not ordered today.    Recent Labs: 10/18/2013: Magnesium 2.1; TSH 1.790 01/04/2014: ALT 26; BUN 17; Creatinine 0.82; Hemoglobin 15.9; Platelets 310; Potassium 3.6*; Sodium 140    Lipid Panel    Component Value Date/Time   CHOL 230* 01/05/2014 0543   TRIG 1469* 01/05/2014 0543   HDL NOT REPORTED DUE TO HIGH TRIGLYCERIDES 01/05/2014 0543   CHOLHDL NOT REPORTED DUE TO HIGH TRIGLYCERIDES 01/05/2014 0543   VLDL UNABLE TO CALCULATE IF TRIGLYCERIDE OVER 400 mg/dL 01/05/2014 0543   LDLCALC UNABLE TO CALCULATE IF TRIGLYCERIDE OVER 400 mg/dL 01/05/2014 0543      Wt Readings from Last 3 Encounters:  06/29/14 167 lb (75.751 kg)  03/24/14 160 lb (72.576 kg)  01/05/14 157 lb (71.215 kg)      Other studies Reviewed: Additional studies/ records that were reviewed today include and review of the records demonstrates:   Carotid  Dopplers 01/2014 IMPRESSION: Atherosclerotic disease in the carotid arteries bilaterally. Estimated degree of stenosis in the internal carotid arteries is less than 50% bilaterally. Peak systolic velocities have decreased compared to the previous examinations, particularly on the left side. Recommend continued carotid artery surveillance. Alternatively, the degree of stenosis in the carotid arteries may be better characterized with CTA or MRA.   Patent vertebral arteries.   2-D echo 04/2013  Study Conclusions  - Study data: Technically adequate study. - Left ventricle: The cavity size was normal. Wall thickness   was increased in a pattern of moderate LVH. Systolic   function was normal. The estimated ejection fraction was   in the range of 60% to 65%. Wall motion was normal; there   were no regional wall motion abnormalities. Left  ventricular diastolic function parameters were normal. - Aortic valve: Mildly calcified annulus. Mildly thickened   leaflets. Valve area: 1.86cm^2(VTI). Valve area: 1.74cm^2   (Vmax). Transthoracic echocardiography  Lexi scan Myoview 04/2013 IMPRESSION: 1.  Negative Lexi scan myocardial perfusion imaging stress test   2. Normal left ventricular ejection fraction with normal wall motion.   3.  Low risk study for major cardiovascular events.     Electronically Signed   By: Carlyle Dolly   On: 04/23/2013 10:46    ASSESSMENT AND PLAN: CORONARY ATHEROSCLEROSIS NATIVE CORONARY ARTERY Patient has history of nonobstructive CAD. He had a negative Lexi scan Myoview in 04/2013. No recent chest pain. Continue risk factor modification. He continues to smoke. Smoking cessation discussed.   CAROTID ARTERY DISEASE Recent TIA in 01/2014 treated with increase in aspirin. No recurrent symptoms. Carotids need continued surveillance.   Essential hypertension, benign Blood pressure is elevated today. Patient says his blood pressures been stable when  checked by Dr. Nevada Duran. Last week it was 125/78. Recommend 2 g sodium diet. He has an appointment with Dr. Nevada Duran next week. If his blood pressure is elevated recommending increasing his HCTZ or metoprolol.   Tobacco use disorder Smoking cessation discussed.      Sumner Boast, PA-C  06/29/2014 12:54 PM    [facility] [place], [place], [place]  [address] Phone: [phone]; Fax: [phone]

## 2014-06-29 NOTE — Addendum Note (Signed)
Addended by: Barbarann Ehlers A on: 06/29/2014 01:03 PM   Modules accepted: Level of Service

## 2014-06-29 NOTE — Assessment & Plan Note (Signed)
Recent TIA in 01/2014 treated with increase in aspirin. No recurrent symptoms. Carotids need continued surveillance.

## 2014-06-29 NOTE — Assessment & Plan Note (Signed)
Blood pressure is elevated today. Patient says his blood pressures been stable when checked by Dr. Nevada Crane. Last week it was 125/78. Recommend 2 g sodium diet. He has an appointment with Dr. Nevada Crane next week. If his blood pressure is elevated recommending increasing his HCTZ or metoprolol.

## 2014-06-29 NOTE — Assessment & Plan Note (Signed)
Patient has history of nonobstructive CAD. He had a negative Lexi scan Myoview in 04/2013. No recent chest pain. Continue risk factor modification. He continues to smoke. Smoking cessation discussed.

## 2014-06-29 NOTE — Patient Instructions (Signed)
Your physician wants you to follow-up in: 1 year with Dr Ferne Reus will receive a reminder letter in the mail two months in advance. If you don't receive a letter, please call our office to schedule the follow-up appointment.   Please follow 2 Gram sodium diet we gave you   Please follow up with Dr.Hall regarding your blood pressure     Thank you for choosing [facility] !

## 2014-06-29 NOTE — Assessment & Plan Note (Signed)
Smoking cessation discussed 

## 2014-08-08 ENCOUNTER — Other Ambulatory Visit: Payer: Self-pay

## 2014-08-09 MED ORDER — PANTOPRAZOLE SODIUM 40 MG PO TBEC
40.0000 mg | DELAYED_RELEASE_TABLET | Freq: Every morning | ORAL | Status: DC
Start: 2014-08-09 — End: 2014-09-12

## 2014-08-09 NOTE — Telephone Encounter (Signed)
Patient has an appt 6/13 at 8:00 am

## 2014-08-09 NOTE — Telephone Encounter (Signed)
Last visit over 2 years ago. I will fill for a few months to allow time for a follow-up visit. Please schedule him, thank you.

## 2014-08-15 ENCOUNTER — Ambulatory Visit (INDEPENDENT_AMBULATORY_CARE_PROVIDER_SITE_OTHER): Payer: BLUE CROSS/BLUE SHIELD | Admitting: Nurse Practitioner

## 2014-08-15 ENCOUNTER — Encounter: Payer: Self-pay | Admitting: Nurse Practitioner

## 2014-08-15 VITALS — BP 152/94 | HR 67 | Temp 98.1°F | Ht 67.0 in | Wt 167.0 lb

## 2014-08-15 DIAGNOSIS — K219 Gastro-esophageal reflux disease without esophagitis: Secondary | ICD-10-CM | POA: Diagnosis not present

## 2014-08-15 DIAGNOSIS — R197 Diarrhea, unspecified: Secondary | ICD-10-CM

## 2014-08-15 MED ORDER — ESOMEPRAZOLE MAGNESIUM 40 MG PO CPDR: 40.0000 mg | DELAYED_RELEASE_CAPSULE | Freq: Every day | ORAL | Status: DC

## 2014-08-15 NOTE — Assessment & Plan Note (Signed)
GERD symptoms currently poorly controlled on daily Protonix 40 mg. He states he never had problems when he was on Nexium, however his insurance company stopped paying for it so he was required to try other options. Has failed Prilosec and now is currently failing Protonix. We'll attempt to send in Nexium prescription which seems to be the agent that has worked for him. If it is denied by insurance we'll execute a prior authorization or pedal. We'll have him return to the office in 4 weeks for further evaluation of his symptoms.

## 2014-08-15 NOTE — Progress Notes (Signed)
Referring Provider: Delphina Cahill, MD Primary Care Physician:  Delphina Cahill, MD Primary GI:  Dr. Gala Romney  Chief Complaint  Patient presents with  . Diarrhea    HPI:   59 year old male presents on referral from PCP for diarrhea for one month. Labs collected on 07/05/2014 which were sent to our office included CMP, CBC, and other routine labs. None of these showed drastic pertinent abnormalities. EGD completed on 07/16/2012 showed abnormal esophagus suspicious for Candida status post brushing, erosive reflux esophagitis, Schatzki's ring which is dilated with 56 French Maloney dilator, hiatal hernia, antral erosions. Colonoscopy completed on the same day showed colonic diverticulosis and a 4 mm polyp opposite the ileocecal valve which was removed. Stomach biopsy showed reactive gastropathy, polyp was tubular adenoma. KOH brushing demonstrated yeast with pseudohyphae and the patient was treated with Diflucan.   Today he states for the past month he has been having a lot of diarrhea, 6-7 liquid stools a day. Denies fever, chills, feeling sick at all. Denies hematochezia and melena. Denies change in diet, travel. Has not tried any treatments. Denies unintentional weight loss. Admits chronic abdominal pain which has not changed at all recently. Also admits choric nausea, unchanged.Denies chest pain, worsening dyspnea, worsening dizziness; denies syncope and near syncope. Has been eating regularly and pushing fluids. GERD symptoms poorly controlled. Has daily GERD symptoms, worse in the morning and after eating. Is currently taking Protonix 40 daily. Has tried and failed Prilosec as well. Was very well controlled on Nexium but came off due to insurance change in formulary/non-coverage. Denies any other upper or lower GI symptoms.  Past Medical History  Diagnosis Date  . Coronary artery disease     Nonobstructive  . Type 2 diabetes mellitus   . Mixed hyperlipidemia   . Essential hypertension, benign   .  Carotid artery disease   . COPD (chronic obstructive pulmonary disease)   . GERD (gastroesophageal reflux disease)   . Hemorrhoids   . Diabetes mellitus   . Stroke     "ministrokes"  . Arthritis   . Colon polyp     Past Surgical History  Procedure Laterality Date  . Appendectomy  1970  . Cholecystectomy  1993  . Umbilical hernia repair  1993  . Breast surgery      Cyst resection on the right  . Knee surgery      Left knee arthroscopy torn medial meniscus grade 4 chondral changes medial femoral condyle tibial plateau  . Total knee arthroplasty Left 04/27/2012    Procedure: TOTAL KNEE ARTHROPLASTY;  Surgeon: Carole Civil, MD;  Location: [facility];  Service: Orthopedics;  Laterality: Left;  . Colonoscopy  02/12/2007    [id number] friable anal canal hemorrhoids, otherwise normal rectum and colon  . Colonoscopy N/A 07/16/2012    RMR: Colonic diverticulosis. colinic polyp-removed as described above.   . Esophagogastroduodenoscopy (egd) with esophageal dilation N/A 07/16/2012    RMR: Abnormal esophagus suspicious for Candida-status pos KOH brushing. Erosive reflux esophagiits. Schatizi's ring day status post biopsy. Hiatal hernia. Antral erosions status post biopsy.     Current Outpatient Prescriptions  Medication Sig Dispense Refill  . aspirin 325 MG tablet Take 1 tablet (325 mg total) by mouth daily. 30 tablet 1  . fish oil-omega-3 fatty acids 1000 MG capsule Take 2 g by mouth 2 (two) times daily.     . Insulin Glargine (TOUJEO SOLOSTAR) 300 UNIT/ML SOPN Inject 30 Units into the skin at bedtime.     Marland Kitchen lisinopril-hydrochlorothiazide (  PRINZIDE,ZESTORETIC) 20-12.5 MG per tablet Take 1 tablet by mouth every morning. 90 tablet 3  . metFORMIN (GLUCOPHAGE) 1000 MG tablet     . metoprolol tartrate (LOPRESSOR) 25 MG tablet Take 25 mg by mouth 2 (two) times daily.    . pantoprazole (PROTONIX) 40 MG tablet Take 1 tablet (40 mg total) by mouth every morning. 30 tablet 2  . simvastatin  (ZOCOR) 40 MG tablet Take 1 tablet (40 mg total) by mouth every morning. 90 tablet 3  . fenofibrate 160 MG tablet     . hydrOXYzine (ATARAX/VISTARIL) 25 MG tablet 1 at hs, or q6h prn redness and swelling. (Patient not taking: Reported on 08/15/2014) 12 tablet 0  . niacin (NIASPAN) 500 MG CR tablet Take 1 tablet (500 mg total) by mouth at bedtime. (Patient not taking: Reported on 08/15/2014) 30 tablet 1  . pregabalin (LYRICA) 75 MG capsule Take 75 mg by mouth at bedtime.     No current facility-administered medications for this visit.    Allergies as of 08/15/2014  . (No Known Allergies)    Family History  Problem Relation Age of Onset  . Coronary artery disease Father     CABG in his 31's  . Colon cancer Other     maternal great aunt  . Colon cancer Other     paternal great aunt  . Colon polyps Mother     History   Social History  . Marital Status: Married    Spouse Name: N/A  . Number of Children: N/A  . Years of Education: N/A   Occupational History  . [employer]    Social History Main Topics  . Smoking status: Current Every Day Smoker -- 0.50 packs/day for 30 years    Types: Cigarettes  . Smokeless tobacco: Current User    Types: Chew     Comment: vapor cig. 4-5 ciggs per day  . Alcohol Use: Yes     Comment: Socially  . Drug Use: No  . Sexual Activity: Yes    Birth Control/ Protection: None   Other Topics Concern  . Not on file   Social History Narrative    Review of Systems: General: Negative for anorexia, weight loss, fever, chills, fatigue, weakness. ENT: Negative for hoarseness, difficulty swallowing. CV: Negative for chest pain, angina, palpitations, peripheral edema.  Respiratory: Negative for dyspnea at rest, cough, wheezing.  GI: See history of present illness. Endo: Negative for unusual weight change.   Physical Exam: BP 152/94 mmHg  Pulse 67  Temp(Src) 98.1 F (36.7 C) (Oral)  Ht 5\' 7"  (1.702 m)  Wt 167 lb (75.751 kg)  BMI 26.15  kg/m2 General:   Alert and oriented. Pleasant and cooperative. Well-nourished and well-developed.  Head:  Normocephalic and atraumatic. Eyes:  Without icterus, sclera clear and conjunctiva pink.  Ears:  Normal auditory acuity. Cardiovascular:  S1, S2 present without murmurs appreciated. Normal pulses noted. Extremities without clubbing or edema. Respiratory:  Clear to auscultation bilaterally. No wheezes, rales, or rhonchi. No distress.  Gastrointestinal:  +BS, soft, and non-distended. Mild TTP epigastric area. No HSM noted. No guarding or rebound. No masses appreciated.  Rectal:  Deferred  Skin:  Intact without significant lesions or rashes noted. Neurologic:  Alert and oriented x4;  grossly normal neurologically. Psych:  Alert and cooperative. Normal mood and affect.    08/15/2014 8:21 AM

## 2014-08-15 NOTE — Assessment & Plan Note (Addendum)
Patient with a four-week history of 67 watery stools per day. Otherwise she states she feels pretty well. No other symptoms. He does have chronic abdominal pain and nausea and these have not worsened. Is making concerted effort to remain hydrated and nourished. We'll have him continue to push fluids, encourage diet. Today we will order stool studies to check for infection as well as serology to check for celiac disease. We'll have him return in 4 weeks for further evaluation of his symptoms. Can consider an antidiarrheal medication once he is cleared for intestinal infection

## 2014-08-15 NOTE — Patient Instructions (Signed)
1. Bring her stool samples to the lab when he is collected a specimen. 2. Have your labs drawn the next couple days. 3. I send in a prescription for Nexium to your pharmacy, we'll see if insurance will pay for it. If not we will try prior off/peel of her due to failing multiple other acid blocking medicines. 4. Return for a follow-up visit in 4 weeks.

## 2014-08-17 NOTE — Progress Notes (Signed)
cc'd to pcp 

## 2014-09-04 LAB — CLOSTRIDIUM DIFFICILE BY PCR: Toxigenic C. Difficile by PCR: NOT DETECTED

## 2014-09-05 LAB — IGA: IgA: 240 mg/dL (ref 68–379)

## 2014-09-06 LAB — GIARDIA ANTIGEN: Giardia Screen (EIA): NEGATIVE

## 2014-09-06 LAB — TISSUE TRANSGLUTAMINASE, IGA: Tissue Transglutaminase Ab, IgA: 1 U/mL (ref <4)

## 2014-09-07 LAB — STOOL CULTURE

## 2014-09-12 ENCOUNTER — Encounter: Payer: Self-pay | Admitting: Nurse Practitioner

## 2014-09-12 ENCOUNTER — Ambulatory Visit (INDEPENDENT_AMBULATORY_CARE_PROVIDER_SITE_OTHER): Payer: BLUE CROSS/BLUE SHIELD | Admitting: Nurse Practitioner

## 2014-09-12 VITALS — BP 132/77 | HR 70 | Temp 98.2°F | Ht 67.0 in | Wt 163.6 lb

## 2014-09-12 DIAGNOSIS — R197 Diarrhea, unspecified: Secondary | ICD-10-CM

## 2014-09-12 DIAGNOSIS — K219 Gastro-esophageal reflux disease without esophagitis: Secondary | ICD-10-CM

## 2014-09-12 MED ORDER — DIPHENOXYLATE-ATROPINE 2.5-0.025 MG PO TABS: 2.0000 | ORAL_TABLET | Freq: Four times a day (QID) | ORAL | Status: DC | PRN

## 2014-09-12 NOTE — Progress Notes (Signed)
Referring Provider: Delphina Cahill, MD Primary Care Physician:  Delphina Cahill, MD Primary GI:  Dr. Gala Romney  Chief Complaint  Patient presents with  . Follow-up    HPI:   65 or old male presents for follow-up on diarrhea and GERD. At last office visit on 08/15/2014 his GERD symptoms are poorly controlled on Protonix. He was previous he well-controlled on Nexium however his insurance pain for since that time felt Prilosec and subsequently Protonix. Sent in a prescription for Nexium to attempt to get insurance payment. He was having 6-7 watery stools per day, stool studies were negative as was tissue transglutaminase IgA. Per telephone note he was called to notify we can send an antidiarrheal medication because his stool studies were negative however never received a call back. His labs into that point including CBC and CMP were essentially normal. Last colonoscopy completed 07/16/2012 with conscious sedation which found a 4 mm polyp which was removed, colonic diverticulosis. On pathology the polyp was found to be tubular adenoma and recommended repeat colonoscopy in 5 years (2019).  Today he states his insurance did cover the Nexium and his GERD symptoms are substantially better. Has mild breakthrough symptoms which are tolerable for him. Denies N/V, hematochezia, melena. States the diarrhea has slowed up to 3 times a day, still watery. Has had a prior cholecystectomy. Sudden onset 2 months ago. Has occasional abdominal pain but it is "not very often." Denies chest pain, dyspnea, dizziness, lightheadedness, syncope, near syncope. Denies any other upper or lower GI symptoms.  Past Medical History  Diagnosis Date  . Coronary artery disease     Nonobstructive  . Type 2 diabetes mellitus   . Mixed hyperlipidemia   . Essential hypertension, benign   . Carotid artery disease   . COPD (chronic obstructive pulmonary disease)   . GERD (gastroesophageal reflux disease)   . Hemorrhoids   . Diabetes mellitus    . Stroke     "ministrokes"  . Arthritis   . Colon polyp     Past Surgical History  Procedure Laterality Date  . Appendectomy  1970  . Cholecystectomy  1993  . Umbilical hernia repair  1993  . Breast surgery      Cyst resection on the right  . Knee surgery      Left knee arthroscopy torn medial meniscus grade 4 chondral changes medial femoral condyle tibial plateau  . Total knee arthroplasty Left 04/27/2012    Procedure: TOTAL KNEE ARTHROPLASTY;  Surgeon: Carole Civil, MD;  Location: [facility];  Service: Orthopedics;  Laterality: Left;  . Colonoscopy  02/12/2007    [id number] friable anal canal hemorrhoids, otherwise normal rectum and colon  . Colonoscopy N/A 07/16/2012    RMR: Colonic diverticulosis. colinic polyp-removed as described above.   . Esophagogastroduodenoscopy (egd) with esophageal dilation N/A 07/16/2012    RMR: Abnormal esophagus suspicious for Candida-status pos KOH brushing. Erosive reflux esophagiits. Schatizi's ring day status post biopsy. Hiatal hernia. Antral erosions status post biopsy.     Current Outpatient Prescriptions  Medication Sig Dispense Refill  . aspirin 325 MG tablet Take 1 tablet (325 mg total) by mouth daily. 30 tablet 1  . esomeprazole (NEXIUM) 40 MG capsule Take 1 capsule (40 mg total) by mouth daily at 12 noon. 30 capsule 2  . fish oil-omega-3 fatty acids 1000 MG capsule Take 2 g by mouth 2 (two) times daily.     . Insulin Glargine (TOUJEO SOLOSTAR) 300 UNIT/ML SOPN Inject 30 Units into the  skin at bedtime.     Marland Kitchen lisinopril-hydrochlorothiazide (PRINZIDE,ZESTORETIC) 20-12.5 MG per tablet Take 1 tablet by mouth every morning. 90 tablet 3  . metFORMIN (GLUCOPHAGE) 1000 MG tablet     . metoprolol tartrate (LOPRESSOR) 25 MG tablet Take 25 mg by mouth 2 (two) times daily.    . simvastatin (ZOCOR) 40 MG tablet Take 1 tablet (40 mg total) by mouth every morning. 90 tablet 3   No current facility-administered medications for this visit.     Allergies as of 09/12/2014  . (No Known Allergies)    Family History  Problem Relation Age of Onset  . Coronary artery disease Father     CABG in his 21's  . Colon cancer Other     maternal great aunt  . Colon cancer Other     paternal great aunt  . Colon polyps Mother     History   Social History  . Marital Status: Married    Spouse Name: N/A  . Number of Children: N/A  . Years of Education: N/A   Occupational History  . [employer] shop    Social History Main Topics  . Smoking status: Current Every Day Smoker -- 0.50 packs/day for 30 years    Types: Cigarettes  . Smokeless tobacco: Current User    Types: Chew     Comment: vapor cig. 4-5 ciggs per day  . Alcohol Use: 0.0 oz/week    0 Standard drinks or equivalent per week     Comment: Socially; Daily tomato juice and beer (one only)  . Drug Use: Yes  . Sexual Activity: Yes    Birth Control/ Protection: None   Other Topics Concern  . None   Social History Narrative    Review of Systems: General: Negative for anorexia, weight loss, fever, chills, fatigue, weakness. ENT: Negative for hoarseness, difficulty swallowing. CV: Negative for chest pain, angina, palpitations, peripheral edema.  Respiratory: Negative for dyspnea at rest, cough, sputum, wheezing.  GI: See history of present illness. Endo: Negative for unusual weight change.  Heme: Negative for bruising or bleeding.   Physical Exam: BP 132/77 mmHg  Pulse 70  Temp(Src) 98.2 F (36.8 C) (Oral)  Ht 5\' 7"  (1.702 m)  Wt 163 lb 9.6 oz (74.208 kg)  BMI 25.62 kg/m2 General:   Alert and oriented. Pleasant and cooperative. Well-nourished and well-developed.  Head:  Normocephalic and atraumatic. Eyes:  Without icterus, sclera clear and conjunctiva pink.  Ears:  Normal auditory acuity. Cardiovascular:  S1, S2 present without murmurs appreciated. Extremities without clubbing or edema. Respiratory:  Clear to auscultation bilaterally. No wheezes, rales, or  rhonchi. No distress.  Gastrointestinal:  +BS, soft, non-tender and non-distended. No HSM noted. No guarding or rebound. No masses appreciated.  Rectal:  Deferred  Musculoskalatal:  Symmetrical without gross deformities. Normal posture. Skin:  Intact without significant lesions or rashes. Neurologic:  Alert and oriented x4;  grossly normal neurologically. Psych:  Alert and cooperative. Normal mood and affect. Heme/Lymph/Immune: No excessive bruising noted.    09/12/2014 2:04 PM

## 2014-09-12 NOTE — Assessment & Plan Note (Signed)
Patient with diarrhea for the past 2 months. Diarrhea seems to slow down and he is currently having 2-3 stools a day with occasional solidifying of his stools. However, 70-80% of time his stools are watery. Stool studies did not reveal any infection. Tissue transglutaminase IgA negative for celiac disease. He has not had any weight loss recently. Denies abdominal pain component to his symptoms. This point we'll trial him on Lomotil for symptomatic relief. Follow-up in 3 months to reevaluate symptoms. If symptoms persist can consider evaluation for bile salt diarrhea and/or pancreatic insufficiency plus any other possible etiology at that time.

## 2014-09-12 NOTE — Patient Instructions (Signed)
1. Continue taking her Nexium. 2. I send in a prescription for Lomotil to help with her diarrhea. He can take 2 tabs up to 4 times a day as needed for diarrhea. Do not take any more than 4 tabs in the day. Call us if your not having any improvement within the next week. 3. Return for follow-up in 3 months.

## 2014-09-12 NOTE — Assessment & Plan Note (Signed)
GERD symptoms are much better controlled now that his insurance plan is paying for Nexium again. He has failed Prilosec and Protonix. Continue taking the Nexium as prescribed and will follow-up in 3 months to reevaluate his symptoms. No red flag or warning signs at this time.

## 2014-09-14 NOTE — Progress Notes (Signed)
CC'ED TO PCP 

## 2014-12-12 ENCOUNTER — Ambulatory Visit (INDEPENDENT_AMBULATORY_CARE_PROVIDER_SITE_OTHER): Payer: BLUE CROSS/BLUE SHIELD | Admitting: Adult Health

## 2014-12-12 ENCOUNTER — Encounter: Payer: Self-pay | Admitting: Adult Health

## 2014-12-12 VITALS — BP 132/80 | HR 69 | Ht 67.0 in | Wt 158.0 lb

## 2014-12-12 DIAGNOSIS — I10 Essential (primary) hypertension: Secondary | ICD-10-CM | POA: Diagnosis not present

## 2014-12-12 DIAGNOSIS — I6523 Occlusion and stenosis of bilateral carotid arteries: Secondary | ICD-10-CM

## 2014-12-12 MED ORDER — MECLIZINE HCL 25 MG PO TABS: 25.0000 mg | ORAL_TABLET | Freq: Three times a day (TID) | ORAL | Status: DC | PRN

## 2014-12-12 NOTE — Progress Notes (Deleted)
Name: Jimmy Duran    DOB: [birth year]  Age: 59 y.o.  MR#: [id number]       PCP:  Wende Neighbors, MD      Insurance: Payor: [place] / Plan: BCBS OTHER / Product Type: *No Product type* /   CC:   No chief complaint on file.   VS Filed Vitals:   12/12/14 1526  BP: 132/80  Pulse: 69  Height: 5\' 7"  (1.702 m)  Weight: 158 lb (71.668 kg)  SpO2: 99%    Weights Current Weight  12/12/14 158 lb (71.668 kg)  09/12/14 163 lb 9.6 oz (74.208 kg)  08/15/14 167 lb (75.751 kg)    Blood Pressure  BP Readings from Last 3 Encounters:  12/12/14 132/80  09/12/14 132/77  08/15/14 152/94     Admit date:  (Not on file) Last encounter with RMR:  Visit date not found   Allergy Review of patient's allergies indicates no known allergies.  Current Outpatient Prescriptions  Medication Sig Dispense Refill  . aspirin 325 MG tablet Take 1 tablet (325 mg total) by mouth daily. 30 tablet 1  . esomeprazole (NEXIUM) 40 MG capsule Take 1 capsule (40 mg total) by mouth daily at 12 noon. 30 capsule 2  . fish oil-omega-3 fatty acids 1000 MG capsule Take 2 g by mouth 2 (two) times daily.     . Insulin Glargine (TOUJEO SOLOSTAR) 300 UNIT/ML SOPN Inject 30 Units into the skin at bedtime.     Marland Kitchen lisinopril-hydrochlorothiazide (PRINZIDE,ZESTORETIC) 20-12.5 MG per tablet Take 1 tablet by mouth every morning. 90 tablet 3  . metFORMIN (GLUCOPHAGE) 1000 MG tablet     . metoprolol tartrate (LOPRESSOR) 25 MG tablet Take 25 mg by mouth 2 (two) times daily.    . simvastatin (ZOCOR) 40 MG tablet Take 1 tablet (40 mg total) by mouth every morning. 90 tablet 3   No current facility-administered medications for this visit.    Discontinued Meds:    Medications Discontinued During This Encounter  Medication Reason  . diphenoxylate-atropine (LOMOTIL) 2.5-0.025 MG per tablet Error    Patient Active Problem List   Diagnosis Date Noted  . Diarrhea 08/15/2014  . Dizziness 01/04/2014  . Transient ischemic attack  01/04/2014  . Palpitations 04/22/2013  . Esophageal dysphagia 07/02/2012  . OA (osteoarthritis) of knee 06/17/2011  . GERD (gastroesophageal reflux disease) 05/04/2010  . Diabetes ([place]) 05/04/2010  . COPD (chronic obstructive pulmonary disease) ([place]) 05/04/2010  . Hemorrhoids 05/04/2010  . Mixed hyperlipidemia 04/25/2010  . Tobacco use disorder 04/25/2010  . Essential hypertension, benign 04/25/2010  . CORONARY ATHEROSCLEROSIS NATIVE CORONARY ARTERY 04/25/2010  . CAROTID ARTERY DISEASE 04/25/2010    LABS    Component Value Date/Time   NA 140 01/04/2014 1250   NA 140 10/18/2013 1600   NA 139 04/23/2013 0209   K 3.6* 01/04/2014 1250   K 4.1 10/18/2013 1600   K 3.6* 04/23/2013 0209   CL 100 01/04/2014 1250   CL 102 10/18/2013 1600   CL 101 04/23/2013 0209   CO2 25 01/04/2014 1250   CO2 21 10/18/2013 1600   CO2 26 04/23/2013 0209   GLUCOSE 178* 01/04/2014 1250   GLUCOSE 162* 10/18/2013 1600   GLUCOSE 144* 04/23/2013 0209   BUN 17 01/04/2014 1250   BUN 19 10/18/2013 1600   BUN 15 04/23/2013 0209   CREATININE 0.82 01/04/2014 1250   CREATININE 0.92 10/18/2013 1600   CREATININE 0.74 04/23/2013 0209   CALCIUM 9.8 01/04/2014 1250  CALCIUM 9.7 10/18/2013 1600   CALCIUM 9.1 04/23/2013 0209   GFRNONAA >90 01/04/2014 1250   GFRNONAA >90 10/18/2013 1600   GFRNONAA >90 04/23/2013 0209   GFRAA >90 01/04/2014 1250   GFRAA >90 10/18/2013 1600   GFRAA >90 04/23/2013 0209   CMP     Component Value Date/Time   NA 140 01/04/2014 1250   K 3.6* 01/04/2014 1250   CL 100 01/04/2014 1250   CO2 25 01/04/2014 1250   GLUCOSE 178* 01/04/2014 1250   BUN 17 01/04/2014 1250   CREATININE 0.82 01/04/2014 1250   CALCIUM 9.8 01/04/2014 1250   PROT 7.4 01/04/2014 1250   ALBUMIN 4.0 01/04/2014 1250   AST 20 01/04/2014 1250   ALT 26 01/04/2014 1250   ALKPHOS 103 01/04/2014 1250   BILITOT 0.3 01/04/2014 1250   GFRNONAA >90 01/04/2014 1250   GFRAA >90 01/04/2014 1250       Component Value  Date/Time   WBC 8.3 01/04/2014 1250   WBC 8.0 10/18/2013 1600   WBC 9.6 04/22/2013 1359   HGB 15.9 01/04/2014 1250   HGB 14.9 10/18/2013 1600   HGB 16.0 04/22/2013 1359   HCT 44.7 01/04/2014 1250   HCT 42.2 10/18/2013 1600   HCT 45.3 04/22/2013 1359   MCV 93.1 01/04/2014 1250   MCV 95.9 10/18/2013 1600   MCV 95.8 04/22/2013 1359    Lipid Panel     Component Value Date/Time   CHOL 230* 01/05/2014 0543   TRIG 1469* 01/05/2014 0543   HDL NOT REPORTED DUE TO HIGH TRIGLYCERIDES 01/05/2014 0543   CHOLHDL NOT REPORTED DUE TO HIGH TRIGLYCERIDES 01/05/2014 0543   VLDL UNABLE TO CALCULATE IF TRIGLYCERIDE OVER 400 mg/dL 01/05/2014 0543   LDLCALC UNABLE TO CALCULATE IF TRIGLYCERIDE OVER 400 mg/dL 01/05/2014 0543    ABG No results found for: PHART, PCO2ART, PO2ART, HCO3, TCO2, ACIDBASEDEF, O2SAT   Lab Results  Component Value Date   TSH 1.790 10/18/2013   BNP (last 3 results) No results for input(s): BNP in the last 8760 hours.  ProBNP (last 3 results) No results for input(s): PROBNP in the last 8760 hours.  Cardiac Panel (last 3 results) No results for input(s): CKTOTAL, CKMB, TROPONINI, RELINDX in the last 72 hours.  Iron/TIBC/Ferritin/ %Sat No results found for: IRON, TIBC, FERRITIN, IRONPCTSAT   EKG Orders placed or performed in visit on 12/12/14  . EKG 12-Lead     Prior Assessment and Plan Problem List as of 12/12/2014      Cardiovascular and Mediastinum   Essential hypertension, benign   Last Assessment & Plan 06/29/2014 Office Visit Written 06/29/2014 12:57 PM by Imogene Burn, PA-C    Blood pressure is elevated today. Patient says his blood pressures been stable when checked by Dr. [place] Crane. Last week it was 125/78. Recommend 2 g sodium diet. He has an appointment with Dr. [place] Crane next week. If his blood pressure is elevated recommending increasing his HCTZ or metoprolol.      CORONARY ATHEROSCLEROSIS NATIVE CORONARY ARTERY   Last Assessment & Plan 06/29/2014 Office Visit  Written 06/29/2014 12:56 PM by Imogene Burn, PA-C    Patient has history of nonobstructive CAD. He had a negative Lexi scan Myoview in 04/2013. No recent chest pain. Continue risk factor modification. He continues to smoke. Smoking cessation discussed.      CAROTID ARTERY DISEASE   Last Assessment & Plan 06/29/2014 Office Visit Written 06/29/2014 12:56 PM by Imogene Burn, PA-C    Recent TIA in 01/2014  treated with increase in aspirin. No recurrent symptoms. Carotids need continued surveillance.      Hemorrhoids   Transient ischemic attack     Respiratory   COPD (chronic obstructive pulmonary disease) (HCC)     Digestive   GERD (gastroesophageal reflux disease)   Last Assessment & Plan 09/12/2014 Office Visit Written 09/12/2014  3:00 PM by Carlis Stable, NP    GERD symptoms are much better controlled now that his insurance plan is paying for Nexium again. He has failed Prilosec and Protonix. Continue taking the Nexium as prescribed and will follow-up in 3 months to reevaluate his symptoms. No red flag or warning signs at this time.      Esophageal dysphagia     Endocrine   Diabetes [facility])   Last Assessment & Plan 07/02/2012 Office Visit Written 07/02/2012 10:01 PM by Mahala Menghini, PA-C    Patient has appointment to see Dr. Wende Neighbors in three weeks. Previously on metformin 500mg  BID. RX provided to last until OV. He should continue CBGs.        Musculoskeletal and Integument   OA (osteoarthritis) of knee     Other   Mixed hyperlipidemia   Last Assessment & Plan 12/09/2013 Office Visit Written 12/09/2013  3:47 PM by Satira Sark, MD    Continues on Zocor. Followup lab work pending.      Tobacco use disorder   Last Assessment & Plan 06/29/2014 Office Visit Written 06/29/2014 12:57 PM by Imogene Burn, PA-C    Smoking cessation discussed.      Palpitations   Dizziness   Diarrhea   Last Assessment & Plan 09/12/2014 Office Visit Written 09/12/2014  3:01 PM by Carlis Stable, NP     Patient with diarrhea for the past 2 months. Diarrhea seems to slow down and he is currently having 2-3 stools a day with occasional solidifying of his stools. However, 70-80% of time his stools are watery. Stool studies did not reveal any infection. Tissue transglutaminase IgA negative for celiac disease. He has not had any weight loss recently. Denies abdominal pain component to his symptoms. This point we'll trial him on Lomotil for symptomatic relief. Follow-up in 3 months to reevaluate symptoms. If symptoms persist can consider evaluation for bile salt diarrhea and/or pancreatic insufficiency plus any other possible etiology at that time.          Imaging: No results found.

## 2014-12-12 NOTE — Progress Notes (Signed)
Cardiology Office Note   Date:  12/12/2014   ID:  WOLFGANG FINIGAN, DOB 01/06/1956, MRN [id number]  PCP:  Wende Neighbors, MD  Cardiologist: [place]/ Jory Sims, NP   Chief Complaint  Patient presents with  . Hypertension  . Dizziness      History of Present Illness: Jimmy Duran is a 59 y.o. male who presents for ongoing assessment and management of nonobstructive CAD by remote cath, hypertension, hyperlipidemia, DM 2, carotid stenosis and prior CVA. He had a negative Lexi scan Myoview in 04/2013. He had a TIA in 01/2014. He was to follow-up with neurology. Aspirin was increased. 2-D echo 04/2013 showed normal LV function. Carotid Dopplers in 01/2014 need continued surveillance.  He comes today with complaints of dizziness, especially with bending over or turning his head.he states this has been going on for 2 weeks and he is almost had a syncopal episode.  He denies any issues with ataxia or numbness and tingling.  He has not fallen.  Past Medical History  Diagnosis Date  . Coronary artery disease     Nonobstructive  . Type 2 diabetes mellitus ([place])   . Mixed hyperlipidemia   . Essential hypertension, benign   . Carotid artery disease ([place])   . COPD (chronic obstructive pulmonary disease) ([place])   . GERD (gastroesophageal reflux disease)   . Hemorrhoids   . Diabetes mellitus   . Stroke [facility])     "ministrokes"  . Arthritis   . Colon polyp     Past Surgical History  Procedure Laterality Date  . Appendectomy  1970  . Cholecystectomy  1993  . Umbilical hernia repair  1993  . Breast surgery      Cyst resection on the right  . Knee surgery      Left knee arthroscopy torn medial meniscus grade 4 chondral changes medial femoral condyle tibial plateau  . Total knee arthroplasty Left 04/27/2012    Procedure: TOTAL KNEE ARTHROPLASTY;  Surgeon: Carole Civil, MD;  Location: [facility];  Service: Orthopedics;  Laterality: Left;  . Colonoscopy  02/12/2007    [id number]  friable anal canal hemorrhoids, otherwise normal rectum and colon  . Colonoscopy N/A 07/16/2012    RMR: Colonic diverticulosis. colinic polyp-removed as described above.   . Esophagogastroduodenoscopy (egd) with esophageal dilation N/A 07/16/2012    RMR: Abnormal esophagus suspicious for Candida-status pos KOH brushing. Erosive reflux esophagiits. Schatizi's ring day status post biopsy. Hiatal hernia. Antral erosions status post biopsy.      Current Outpatient Prescriptions  Medication Sig Dispense Refill  . aspirin 325 MG tablet Take 1 tablet (325 mg total) by mouth daily. 30 tablet 1  . esomeprazole (NEXIUM) 40 MG capsule Take 1 capsule (40 mg total) by mouth daily at 12 noon. 30 capsule 2  . fish oil-omega-3 fatty acids 1000 MG capsule Take 2 g by mouth 2 (two) times daily.     . Insulin Glargine (TOUJEO SOLOSTAR) 300 UNIT/ML SOPN Inject 30 Units into the skin at bedtime.     Marland Kitchen lisinopril-hydrochlorothiazide (PRINZIDE,ZESTORETIC) 20-12.5 MG per tablet Take 1 tablet by mouth every morning. 90 tablet 3  . metFORMIN (GLUCOPHAGE) 1000 MG tablet     . metoprolol tartrate (LOPRESSOR) 25 MG tablet Take 25 mg by mouth 2 (two) times daily.    . simvastatin (ZOCOR) 40 MG tablet Take 1 tablet (40 mg total) by mouth every morning. 90 tablet 3  . meclizine (ANTIVERT) 25 MG tablet Take 1 tablet (25 mg total) by  mouth 3 (three) times daily as needed for dizziness. 30 tablet 1   No current facility-administered medications for this visit.    Allergies:   Review of patient's allergies indicates no known allergies.    Social History:  The patient  reports that he has been smoking Cigarettes.  He has a 15 pack-year smoking history. His smokeless tobacco use includes Chew. He reports that he drinks alcohol. He reports that he uses illicit drugs.   Family History:  The patient's family history includes Colon cancer in his other and other; Colon polyps in his mother; Coronary artery disease in his father.     ROS: All other systems are reviewed and negative. Unless otherwise mentioned in H&P    PHYSICAL EXAM: VS:  BP 132/80 mmHg  Pulse 69  Ht 5\' 7"  (1.702 m)  Wt 158 lb (71.668 kg)  BMI 24.74 kg/m2  SpO2 99% , BMI Body mass index is 24.74 kg/(m^2). GEN: Well nourished, well developed, in no acute distress HEENT: normal Neck: no JVD, carotid bruits, or masses Cardiac: RRR; no murmurs, rubs, or gallops,no edema  Respiratory:  clear to auscultation bilaterally, normal work of breathing GI: soft, nontender, nondistended, + BS MS: no deformity or atrophy Skin: warm and dry, no rash Neuro:  Strength and sensation are intact Psych: euthymic mood, full affect   Recent Labs 01/04/2014: ALT 26; BUN 17; Creatinine, Ser 0.82; Hemoglobin 15.9; Platelets 310; Potassium 3.6*; Sodium 140    Lipid Panel    Component Value Date/Time   CHOL 230* 01/05/2014 0543   TRIG 1469* 01/05/2014 0543   HDL NOT REPORTED DUE TO HIGH TRIGLYCERIDES 01/05/2014 0543   CHOLHDL NOT REPORTED DUE TO HIGH TRIGLYCERIDES 01/05/2014 0543   VLDL UNABLE TO CALCULATE IF TRIGLYCERIDE OVER 400 mg/dL 01/05/2014 0543   LDLCALC UNABLE TO CALCULATE IF TRIGLYCERIDE OVER 400 mg/dL 01/05/2014 0543      Wt Readings from Last 3 Encounters:  12/12/14 158 lb (71.668 kg)  09/12/14 163 lb 9.6 oz (74.208 kg)  08/15/14 167 lb (75.751 kg)      ASSESSMENT AND PLAN:  1. Dizziness: This appears to be BPV, I have had him having his head over the side of the exam table, and he had symptoms, on both sides.  He also felt that when he was bending over.  Orthostatics were completed.  They were negative.  Blood pressure was 127/88 with a heart rate of 67, while lying.  Blood pressure was 151/94 with a heart rate of 66 while sitting.  Blood pressure was 141/95 while standing, with a heart rate of 70. EKG revealed normal sinus rhythm, mild LVH was noted.  Heart rate 72 beats per minute  I will begin Antivert 25 mg, one by mouth 3 times a day.   I will order carotid Dopplers for reevaluation for carotid artery stenosis, especially with dizziness while he turns his head.  He is due to see his primary care physician, Dr. Edwyna Ready, [place] in 2 days.  I would like to get some labs that he states, is less expensive for them to be drawn in his PCP office.  I would like to have a BMET, and a CBC to evaluate kidney function, dehydration, and for anemia.no ischemic testing will be completed at this time.  If dizziness continues can proceed with a stress test and an echo for cardiac function.  2. Hypertension:when I make any changes on his medication regimen at this time.  BP appears well controlled currently.  There  is no evidence of orthostasis.  3. Hypercholesterolemia:he is on statins.  He is due to have lab work drawn by his primary care, Wednesday, fasting lipids and LFTs are recommended.  Current medicines are reviewed at length with the patient today.    Labs/ tests ordered today include: Needs BMET, CBC and fasting lipids. He will have these drawn by PCP  Orders Placed This Encounter  Procedures  . EKG 12-Lead     Disposition:   FU with one month.    Signed, Jory Sims, NP  12/12/2014 4:37 PM    [facility] [address] [place], [place] [address] Phone: [phone]; Fax: [phone]

## 2014-12-12 NOTE — Patient Instructions (Addendum)
Your physician recommends that you schedule a follow-up appointment in: 1 month with K lawrence NP   Tale meclizine 25 mg three times a day for dizziness     Your physician has requested that you have a carotid duplex. This test is an ultrasound of the carotid arteries in your neck. It looks at blood flow through these arteries that supply the brain with blood. Allow one hour for this exam. There are no restrictions or special instructions.      Thank you for choosing [facility] !

## 2014-12-14 ENCOUNTER — Ambulatory Visit: Payer: BLUE CROSS/BLUE SHIELD | Admitting: Nurse Practitioner

## 2014-12-28 ENCOUNTER — Other Ambulatory Visit: Payer: Self-pay | Admitting: Cardiology

## 2015-01-04 ENCOUNTER — Ambulatory Visit (INDEPENDENT_AMBULATORY_CARE_PROVIDER_SITE_OTHER): Payer: BLUE CROSS/BLUE SHIELD

## 2015-01-04 DIAGNOSIS — I6523 Occlusion and stenosis of bilateral carotid arteries: Secondary | ICD-10-CM | POA: Diagnosis not present

## 2015-01-13 ENCOUNTER — Ambulatory Visit: Payer: BLUE CROSS/BLUE SHIELD | Admitting: Adult Health

## 2015-01-19 ENCOUNTER — Ambulatory Visit: Payer: BLUE CROSS/BLUE SHIELD | Admitting: Adult Health

## 2015-01-23 ENCOUNTER — Encounter: Payer: Self-pay | Admitting: Adult Health

## 2015-01-23 ENCOUNTER — Ambulatory Visit (INDEPENDENT_AMBULATORY_CARE_PROVIDER_SITE_OTHER): Payer: BLUE CROSS/BLUE SHIELD | Admitting: Adult Health

## 2015-01-23 VITALS — BP 130/78 | HR 89 | Ht 67.0 in | Wt 164.0 lb

## 2015-01-23 DIAGNOSIS — R42 Dizziness and giddiness: Secondary | ICD-10-CM

## 2015-01-23 DIAGNOSIS — Z720 Tobacco use: Secondary | ICD-10-CM | POA: Diagnosis not present

## 2015-01-23 DIAGNOSIS — I2510 Atherosclerotic heart disease of native coronary artery without angina pectoris: Secondary | ICD-10-CM

## 2015-01-23 NOTE — Progress Notes (Deleted)
Name: Jimmy Duran    DOB: 1955-08-22  Age: 59 y.o.  MR#: [id number]       PCP:  Wende Neighbors, MD      Insurance: Payor: [place] / Plan: BCBS OTHER / Product Type: *No Product type* /   CC:   No chief complaint on file.   VS Filed Vitals:   01/23/15 1423  BP: 130/78  Pulse: 89  Height: 5\' 7"  (1.702 m)  Weight: 164 lb (74.39 kg)  SpO2: 96%    Weights Current Weight  01/23/15 164 lb (74.39 kg)  12/12/14 158 lb (71.668 kg)  09/12/14 163 lb 9.6 oz (74.208 kg)    Blood Pressure  BP Readings from Last 3 Encounters:  01/23/15 130/78  12/12/14 132/80  09/12/14 132/77     Admit date:  (Not on file) Last encounter with RMR:  12/12/2014   Allergy Review of patient's allergies indicates no known allergies.  Current Outpatient Prescriptions  Medication Sig Dispense Refill  . aspirin 325 MG tablet Take 1 tablet (325 mg total) by mouth daily. 30 tablet 1  . esomeprazole (NEXIUM) 40 MG capsule Take 1 capsule (40 mg total) by mouth daily at 12 noon. 30 capsule 2  . fish oil-omega-3 fatty acids 1000 MG capsule Take 2 g by mouth 2 (two) times daily.     . Insulin Glargine (TOUJEO SOLOSTAR) 300 UNIT/ML SOPN Inject 30 Units into the skin at bedtime.     Marland Kitchen lisinopril-hydrochlorothiazide (PRINZIDE,ZESTORETIC) 20-12.5 MG tablet TAKE ONE TABLET BY MOUTH EVERY MORNING 90 tablet 3  . meclizine (ANTIVERT) 25 MG tablet Take 1 tablet (25 mg total) by mouth 3 (three) times daily as needed for dizziness. 30 tablet 1  . metFORMIN (GLUCOPHAGE) 1000 MG tablet     . metoprolol tartrate (LOPRESSOR) 25 MG tablet Take 25 mg by mouth 2 (two) times daily.    . simvastatin (ZOCOR) 40 MG tablet TAKE ONE TABLET (40 MG) BY MOUTH EVERY MORNING 90 tablet 3   No current facility-administered medications for this visit.    Discontinued Meds:   There are no discontinued medications.  Patient Active Problem List   Diagnosis Date Noted  . Diarrhea 08/15/2014  . Dizziness 01/04/2014  . Transient  ischemic attack 01/04/2014  . Palpitations 04/22/2013  . Esophageal dysphagia 07/02/2012  . OA (osteoarthritis) of knee 06/17/2011  . GERD (gastroesophageal reflux disease) 05/04/2010  . Diabetes ([place]) 05/04/2010  . COPD (chronic obstructive pulmonary disease) ([place]) 05/04/2010  . Hemorrhoids 05/04/2010  . Mixed hyperlipidemia 04/25/2010  . Tobacco use disorder 04/25/2010  . Essential hypertension, benign 04/25/2010  . CORONARY ATHEROSCLEROSIS NATIVE CORONARY ARTERY 04/25/2010  . CAROTID ARTERY DISEASE 04/25/2010    LABS    Component Value Date/Time   NA 140 01/04/2014 1250   NA 140 10/18/2013 1600   NA 139 04/23/2013 0209   K 3.6* 01/04/2014 1250   K 4.1 10/18/2013 1600   K 3.6* 04/23/2013 0209   CL 100 01/04/2014 1250   CL 102 10/18/2013 1600   CL 101 04/23/2013 0209   CO2 25 01/04/2014 1250   CO2 21 10/18/2013 1600   CO2 26 04/23/2013 0209   GLUCOSE 178* 01/04/2014 1250   GLUCOSE 162* 10/18/2013 1600   GLUCOSE 144* 04/23/2013 0209   BUN 17 01/04/2014 1250   BUN 19 10/18/2013 1600   BUN 15 04/23/2013 0209   CREATININE 0.82 01/04/2014 1250   CREATININE 0.92 10/18/2013 1600   CREATININE 0.74 04/23/2013 0209  CALCIUM 9.8 01/04/2014 1250   CALCIUM 9.7 10/18/2013 1600   CALCIUM 9.1 04/23/2013 0209   GFRNONAA >90 01/04/2014 1250   GFRNONAA >90 10/18/2013 1600   GFRNONAA >90 04/23/2013 0209   GFRAA >90 01/04/2014 1250   GFRAA >90 10/18/2013 1600   GFRAA >90 04/23/2013 0209   CMP     Component Value Date/Time   NA 140 01/04/2014 1250   K 3.6* 01/04/2014 1250   CL 100 01/04/2014 1250   CO2 25 01/04/2014 1250   GLUCOSE 178* 01/04/2014 1250   BUN 17 01/04/2014 1250   CREATININE 0.82 01/04/2014 1250   CALCIUM 9.8 01/04/2014 1250   PROT 7.4 01/04/2014 1250   ALBUMIN 4.0 01/04/2014 1250   AST 20 01/04/2014 1250   ALT 26 01/04/2014 1250   ALKPHOS 103 01/04/2014 1250   BILITOT 0.3 01/04/2014 1250   GFRNONAA >90 01/04/2014 1250   GFRAA >90 01/04/2014 1250        Component Value Date/Time   WBC 8.3 01/04/2014 1250   WBC 8.0 10/18/2013 1600   WBC 9.6 04/22/2013 1359   HGB 15.9 01/04/2014 1250   HGB 14.9 10/18/2013 1600   HGB 16.0 04/22/2013 1359   HCT 44.7 01/04/2014 1250   HCT 42.2 10/18/2013 1600   HCT 45.3 04/22/2013 1359   MCV 93.1 01/04/2014 1250   MCV 95.9 10/18/2013 1600   MCV 95.8 04/22/2013 1359    Lipid Panel     Component Value Date/Time   CHOL 230* 01/05/2014 0543   TRIG 1469* 01/05/2014 0543   HDL NOT REPORTED DUE TO HIGH TRIGLYCERIDES 01/05/2014 0543   CHOLHDL NOT REPORTED DUE TO HIGH TRIGLYCERIDES 01/05/2014 0543   VLDL UNABLE TO CALCULATE IF TRIGLYCERIDE OVER 400 mg/dL 01/05/2014 0543   LDLCALC UNABLE TO CALCULATE IF TRIGLYCERIDE OVER 400 mg/dL 01/05/2014 0543    ABG No results found for: PHART, PCO2ART, PO2ART, HCO3, TCO2, ACIDBASEDEF, O2SAT   Lab Results  Component Value Date   TSH 1.790 10/18/2013   BNP (last 3 results) No results for input(s): BNP in the last 8760 hours.  ProBNP (last 3 results) No results for input(s): PROBNP in the last 8760 hours.  Cardiac Panel (last 3 results) No results for input(s): CKTOTAL, CKMB, TROPONINI, RELINDX in the last 72 hours.  Iron/TIBC/Ferritin/ %Sat No results found for: IRON, TIBC, FERRITIN, IRONPCTSAT   EKG Orders placed or performed in visit on 12/12/14  . EKG 12-Lead     Prior Assessment and Plan Problem List as of 01/23/2015      Cardiovascular and Mediastinum   Essential hypertension, benign   Last Assessment & Plan 06/29/2014 Office Visit Written 06/29/2014 12:57 PM by Imogene Burn, PA-C    Blood pressure is elevated today. Patient says his blood pressures been stable when checked by Dr. [place] Crane. Last week it was 125/78. Recommend 2 g sodium diet. He has an appointment with Dr. [place] Crane next week. If his blood pressure is elevated recommending increasing his HCTZ or metoprolol.      CORONARY ATHEROSCLEROSIS NATIVE CORONARY ARTERY   Last Assessment & Plan  06/29/2014 Office Visit Written 06/29/2014 12:56 PM by Imogene Burn, PA-C    Patient has history of nonobstructive CAD. He had a negative Lexi scan Myoview in 04/2013. No recent chest pain. Continue risk factor modification. He continues to smoke. Smoking cessation discussed.      CAROTID ARTERY DISEASE   Last Assessment & Plan 06/29/2014 Office Visit Written 06/29/2014 12:56 PM by Imogene Burn, PA-C  Recent TIA in 01/2014 treated with increase in aspirin. No recurrent symptoms. Carotids need continued surveillance.      Hemorrhoids   Transient ischemic attack     Respiratory   COPD (chronic obstructive pulmonary disease) (HCC)     Digestive   GERD (gastroesophageal reflux disease)   Last Assessment & Plan 09/12/2014 Office Visit Written 09/12/2014  3:00 PM by Carlis Stable, NP    GERD symptoms are much better controlled now that his insurance plan is paying for Nexium again. He has failed Prilosec and Protonix. Continue taking the Nexium as prescribed and will follow-up in 3 months to reevaluate his symptoms. No red flag or warning signs at this time.      Esophageal dysphagia     Endocrine   Diabetes [facility])   Last Assessment & Plan 07/02/2012 Office Visit Written 07/02/2012 10:01 PM by Mahala Menghini, PA-C    Patient has appointment to see Dr. Wende Neighbors in three weeks. Previously on metformin 500mg  BID. RX provided to last until OV. He should continue CBGs.        Musculoskeletal and Integument   OA (osteoarthritis) of knee     Other   Mixed hyperlipidemia   Last Assessment & Plan 12/09/2013 Office Visit Written 12/09/2013  3:47 PM by Satira Sark, MD    Continues on Zocor. Followup lab work pending.      Tobacco use disorder   Last Assessment & Plan 06/29/2014 Office Visit Written 06/29/2014 12:57 PM by Imogene Burn, PA-C    Smoking cessation discussed.      Palpitations   Dizziness   Diarrhea   Last Assessment & Plan 09/12/2014 Office Visit Written 09/12/2014  3:01 PM  by Carlis Stable, NP    Patient with diarrhea for the past 2 months. Diarrhea seems to slow down and he is currently having 2-3 stools a day with occasional solidifying of his stools. However, 70-80% of time his stools are watery. Stool studies did not reveal any infection. Tissue transglutaminase IgA negative for celiac disease. He has not had any weight loss recently. Denies abdominal pain component to his symptoms. This point we'll trial him on Lomotil for symptomatic relief. Follow-up in 3 months to reevaluate symptoms. If symptoms persist can consider evaluation for bile salt diarrhea and/or pancreatic insufficiency plus any other possible etiology at that time.          Imaging: No results found.

## 2015-01-23 NOTE — Patient Instructions (Addendum)
Your physician wants you to follow-up in: 1 year.  You will receive a reminder letter in the mail two months in advance. If you don't receive a letter, please call our office to schedule the follow-up appointment.  Your physician recommends that you continue on your current medications as directed. Please refer to the Current Medication list given to you today.  You have been referred to Dr.Teo  If you need a refill on your cardiac medications before your next appointment, please call your pharmacy.  Thank you for choosing [facility]!

## 2015-01-23 NOTE — Progress Notes (Signed)
Cardiology Office Note   Date:  01/23/2015   ID:  [name], DOB 10/08/1955, MRN [id number]  PCP:  Wende Neighbors, MD  Cardiologist: [place]/  Jory Sims, NP   Chief Complaint  Patient presents with  . Coronary Artery Disease  . Hypertension      History of Present Illness: Jimmy Duran is a 59 y.o. male who presents for ongoing assessment and management of nonobstructive CAD by remote cath, hypertension, hyperlipidemia, DM 2, carotid stenosis and prior CVA. He had a negative Lexi scan Myoview in 04/2013. He had a TIA in 01/2014. He was to follow-up with neurology. Aspirin was increased. 2-D echo 04/2013 showed normal LV function. Carotid Dopplers in 01/2014 need continued surveillance.Antivert 25 mg, one by mouth 3 times a day for complaints of dizziness, carotid Dopplers for reevaluation for carotid artery stenosis, especially with dizziness while he turns his head.   He has been seen by an audiologist who found that he did have some fluid accumulation in his right ear. He was unable to afford Antivert, and was started on Dramamine and Zyrtec. He feels much better but wishes to be referred to ENT now.   Past Medical History  Diagnosis Date  . Coronary artery disease     Nonobstructive  . Type 2 diabetes mellitus ([place])   . Mixed hyperlipidemia   . Essential hypertension, benign   . Carotid artery disease ([place])   . COPD (chronic obstructive pulmonary disease) ([place])   . GERD (gastroesophageal reflux disease)   . Hemorrhoids   . Diabetes mellitus   . Stroke [facility])     "ministrokes"  . Arthritis   . Colon polyp     Past Surgical History  Procedure Laterality Date  . Appendectomy  1970  . Cholecystectomy  1993  . Umbilical hernia repair  1993  . Breast surgery      Cyst resection on the right  . Knee surgery      Left knee arthroscopy torn medial meniscus grade 4 chondral changes medial femoral condyle tibial plateau  . Total knee arthroplasty Left 04/27/2012   Procedure: TOTAL KNEE ARTHROPLASTY;  Surgeon: Carole Civil, MD;  Location: [facility]S;  Service: Orthopedics;  Laterality: Left;  . Colonoscopy  02/12/2007    [id number] friable anal canal hemorrhoids, otherwise normal rectum and colon  . Colonoscopy N/A 07/16/2012    RMR: Colonic diverticulosis. colinic polyp-removed as described above.   . Esophagogastroduodenoscopy (egd) with esophageal dilation N/A 07/16/2012    RMR: Abnormal esophagus suspicious for Candida-status pos KOH brushing. Erosive reflux esophagiits. Schatizi's ring day status post biopsy. Hiatal hernia. Antral erosions status post biopsy.      Current Outpatient Prescriptions  Medication Sig Dispense Refill  . aspirin 325 MG tablet Take 1 tablet (325 mg total) by mouth daily. 30 tablet 1  . esomeprazole (NEXIUM) 40 MG capsule Take 1 capsule (40 mg total) by mouth daily at 12 noon. 30 capsule 2  . fish oil-omega-3 fatty acids 1000 MG capsule Take 2 g by mouth 2 (two) times daily.     . Insulin Glargine (TOUJEO SOLOSTAR) 300 UNIT/ML SOPN Inject 30 Units into the skin at bedtime.     Marland Kitchen lisinopril-hydrochlorothiazide (PRINZIDE,ZESTORETIC) 20-12.5 MG tablet TAKE ONE TABLET BY MOUTH EVERY MORNING 90 tablet 3  . metFORMIN (GLUCOPHAGE) 1000 MG tablet     . metoprolol tartrate (LOPRESSOR) 25 MG tablet Take 25 mg by mouth 2 (two) times daily.    . simvastatin (ZOCOR) 40 MG  tablet TAKE ONE TABLET (40 MG) BY MOUTH EVERY MORNING 90 tablet 3   No current facility-administered medications for this visit.    Allergies:   Review of patient's allergies indicates no known allergies.    Social History:  The patient  reports that he has been smoking Cigarettes.  He has a 15 pack-year smoking history. His smokeless tobacco use includes Chew. He reports that he drinks alcohol. He reports that he uses illicit drugs.   Family History:  The patient's family history includes Colon cancer in his other and other; Colon polyps in his mother;  Coronary artery disease in his father.    ROS: All other systems are reviewed and negative. Unless otherwise mentioned in H&P    PHYSICAL EXAM: VS:  BP 130/78 mmHg  Pulse 89  Ht 5\' 7"  (1.702 m)  Wt 164 lb (74.39 kg)  BMI 25.68 kg/m2  SpO2 96% , BMI Body mass index is 25.68 kg/(m^2). GEN: Well nourished, well developed, in no acute distress HEENT: normal Neck: no JVD, carotid bruits, or masses Cardiac: RRR; no murmurs, rubs, or gallops,no edema  Respiratory:  clear to auscultation bilaterally, normal work of breathing GI: soft, nontender, nondistended, + BS MS: no deformity or atrophy Skin: warm and dry, no rash Neuro:  Strength and sensation are intact Psych: euthymic mood, full affect   Recent Labs: No results found for requested labs within last 365 days.    Lipid Panel    Component Value Date/Time   CHOL 230* 01/05/2014 0543   TRIG 1469* 01/05/2014 0543   HDL NOT REPORTED DUE TO HIGH TRIGLYCERIDES 01/05/2014 0543   CHOLHDL NOT REPORTED DUE TO HIGH TRIGLYCERIDES 01/05/2014 0543   VLDL UNABLE TO CALCULATE IF TRIGLYCERIDE OVER 400 mg/dL 01/05/2014 0543   LDLCALC UNABLE TO CALCULATE IF TRIGLYCERIDE OVER 400 mg/dL 01/05/2014 0543      Wt Readings from Last 3 Encounters:  01/23/15 164 lb (74.39 kg)  12/12/14 158 lb (71.668 kg)  09/12/14 163 lb 9.6 oz (74.208 kg)     ASSESSMENT AND PLAN:  1. Dizziness: Carotid ultrasound was negative for occlusive disease. Now thought to be middle ear. He is referred to Dr. Melene Plan ENT for further evaluation and treatment. No further cardiac testing.   2. CAD : Stable from a cardiac standpoint. Will see him in a year unless he becomes symptomatic. Continue ACE, statin and BB.   3.Tobacco abuse; Cessation is recommended again for overall risk modification.    Current medicines are reviewed at length with the patient today.    Labs/ tests ordered today include: None  Orders Placed This Encounter  Procedures  . Ambulatory referral  to ENT     Disposition:   FU with 1 year  Signed, Jory Sims, NP  01/23/2015 3:46 PM    [facility] [address] [place], [place] [address] Phone: [phone]; Fax: [phone]

## 2015-04-13 ENCOUNTER — Telehealth: Payer: Self-pay | Admitting: Adult Health

## 2015-04-13 NOTE — Telephone Encounter (Signed)
Pt is out of his Metoprolol and it says no refills on his last bottle, pt saw KL in Nov 2016, pt uses [facility] in [place]. Also, pt states he has been off of the Metoprolol for over a month due to not being able to afford it

## 2015-10-10 ENCOUNTER — Emergency Department (HOSPITAL_COMMUNITY)
Admission: EM | Admit: 2015-10-10 | Discharge: 2015-10-10 | Disposition: A | Discharge status: Home / Self Care | Payer: BLUE CROSS/BLUE SHIELD | Attending: Emergency Medicine | Admitting: Emergency Medicine

## 2015-10-10 ENCOUNTER — Emergency Department (HOSPITAL_COMMUNITY): Payer: BLUE CROSS/BLUE SHIELD

## 2015-10-10 ENCOUNTER — Encounter (HOSPITAL_COMMUNITY): Payer: Self-pay | Admitting: Emergency Medicine

## 2015-10-10 DIAGNOSIS — R079 Chest pain, unspecified: Secondary | ICD-10-CM | POA: Diagnosis not present

## 2015-10-10 DIAGNOSIS — F17220 Nicotine dependence, chewing tobacco, uncomplicated: Secondary | ICD-10-CM | POA: Insufficient documentation

## 2015-10-10 DIAGNOSIS — F17210 Nicotine dependence, cigarettes, uncomplicated: Secondary | ICD-10-CM | POA: Diagnosis not present

## 2015-10-10 DIAGNOSIS — Z8673 Personal history of transient ischemic attack (TIA), and cerebral infarction without residual deficits: Secondary | ICD-10-CM | POA: Diagnosis not present

## 2015-10-10 DIAGNOSIS — Z7984 Long term (current) use of oral hypoglycemic drugs: Secondary | ICD-10-CM | POA: Diagnosis not present

## 2015-10-10 DIAGNOSIS — Z7982 Long term (current) use of aspirin: Secondary | ICD-10-CM | POA: Insufficient documentation

## 2015-10-10 DIAGNOSIS — J449 Chronic obstructive pulmonary disease, unspecified: Secondary | ICD-10-CM | POA: Diagnosis not present

## 2015-10-10 DIAGNOSIS — Z79899 Other long term (current) drug therapy: Secondary | ICD-10-CM | POA: Insufficient documentation

## 2015-10-10 DIAGNOSIS — I10 Essential (primary) hypertension: Secondary | ICD-10-CM | POA: Insufficient documentation

## 2015-10-10 DIAGNOSIS — E119 Type 2 diabetes mellitus without complications: Secondary | ICD-10-CM | POA: Insufficient documentation

## 2015-10-10 LAB — TROPONIN I: Troponin I: <0.03 ng/mL (ref <0.03)

## 2015-10-10 LAB — BASIC METABOLIC PANEL
Anion gap: 11 (ref 5–15)
BUN: 9 mg/dL (ref 6–20)
CO2: 27 mmol/L (ref 22–32)
Calcium: 8.9 mg/dL (ref 8.9–10.3)
Chloride: 101 mmol/L (ref 101–111)
Creatinine, Ser: 0.97 mg/dL (ref 0.61–1.24)
GFR calc Af Amer: >60 mL/min (ref >60)
GFR calc non Af Amer: >60 mL/min (ref >60)
Glucose, Bld: 246 mg/dL — ABNORMAL HIGH (ref 65–99)
Potassium: 3.4 mmol/L — ABNORMAL LOW (ref 3.5–5.1)
Sodium: 139 mmol/L (ref 135–145)

## 2015-10-10 LAB — CBC
HCT: 46.1 % (ref 39.0–52.0)
Hemoglobin: 15.8 g/dL (ref 13.0–17.0)
MCH: 32.6 pg (ref 26.0–34.0)
MCHC: 34.3 g/dL (ref 30.0–36.0)
MCV: 95.2 fL (ref 78.0–100.0)
Platelets: 318 10*3/uL (ref 150–400)
RBC: 4.84 MIL/uL (ref 4.22–5.81)
RDW: 12.8 % (ref 11.5–15.5)
WBC: 7.6 10*3/uL (ref 4.0–10.5)

## 2015-10-10 NOTE — Discharge Instructions (Signed)
Return to the ER if your symptoms significantly worsen or change.

## 2015-10-10 NOTE — ED Provider Notes (Signed)
[place] DEPT Provider Note   CSN: [id number] Arrival date & time: 10/10/15  1114  First Provider Contact:   First MD Initiated Contact with Patient 10/10/15 1143     By signing my name below, I, Jimmy Duran, attest that this documentation has been prepared under the direction and in the presence of Jimmy Speak, MD . Electronically Signed: Dyke Duran, Scribe. 10/10/2015. 11:49 AM.  History   Chief Complaint Chief Complaint  Patient presents with  . Chest Pain    HPI Jimmy Duran is a 60 y.o. male with PMHx of carotid artery disease, COPD, CAD, and DM  who presents to the Emergency Department complaining of sudden onset, sharp, stabbing chest pain which occurred at 10:30 this morning and lasted about 15 minutes. Pt states the symptoms have since resolved.  Pt reports associated high blood pressure. He states his last heart cath was 20 years ago. Pt's last stress test was a few years ago. Pt denies any nausea. No alleviating or modifying factors noted.   The history is provided by the patient. No language interpreter was used.  Chest Pain   This is a new problem. The current episode started 1 to 2 hours ago. The problem has been resolved. The quality of the pain is described as sharp and stabbing. The pain does not radiate. Duration of episode(s) is 15 minutes. Pertinent negatives include no nausea. He has tried nothing for the symptoms. Risk factors include male gender.  His past medical history is significant for CAD, COPD, diabetes, hypertension and TIA.    Past Medical History:  Diagnosis Date  . Arthritis   . Carotid artery disease ([place])   . Colon polyp   . COPD (chronic obstructive pulmonary disease) ([place])   . Coronary artery disease    Nonobstructive  . Diabetes mellitus   . Essential hypertension, benign   . GERD (gastroesophageal reflux disease)   . Hemorrhoids   . Hypertension   . Mixed hyperlipidemia   . Stroke [facility])    "ministrokes"  . Type 2  diabetes mellitus [facility])     Patient Active Problem List   Diagnosis Date Noted  . Diarrhea 08/15/2014  . Dizziness 01/04/2014  . Transient ischemic attack 01/04/2014  . Palpitations 04/22/2013  . Esophageal dysphagia 07/02/2012  . OA (osteoarthritis) of knee 06/17/2011  . GERD (gastroesophageal reflux disease) 05/04/2010  . Diabetes ([place]) 05/04/2010  . COPD (chronic obstructive pulmonary disease) ([place]) 05/04/2010  . Hemorrhoids 05/04/2010  . Mixed hyperlipidemia 04/25/2010  . Tobacco use disorder 04/25/2010  . Essential hypertension, benign 04/25/2010  . CORONARY ATHEROSCLEROSIS NATIVE CORONARY ARTERY 04/25/2010  . CAROTID ARTERY DISEASE 04/25/2010    Past Surgical History:  Procedure Laterality Date  . APPENDECTOMY  1970  . BREAST SURGERY     Cyst resection on the right  . CHOLECYSTECTOMY  1993  . COLONOSCOPY  02/12/2007   [id number] friable anal canal hemorrhoids, otherwise normal rectum and colon  . COLONOSCOPY N/A 07/16/2012   RMR: Colonic diverticulosis. colinic polyp-removed as described above.   . ESOPHAGOGASTRODUODENOSCOPY (EGD) WITH ESOPHAGEAL DILATION N/A 07/16/2012   RMR: Abnormal esophagus suspicious for Candida-status pos KOH brushing. Erosive reflux esophagiits. Schatizi's ring day status post biopsy. Hiatal hernia. Antral erosions status post biopsy.   Marland Kitchen KNEE SURGERY     Left knee arthroscopy torn medial meniscus grade 4 chondral changes medial femoral condyle tibial plateau  . TOTAL KNEE ARTHROPLASTY Left 04/27/2012   Procedure: TOTAL KNEE ARTHROPLASTY;  Surgeon: Carole Civil, MD;  Location: [facility] OR[facility];  Service: Orthopedics;  Laterality: Left;  . [place] Medications    Prior to Admission medications   Medication Sig Start Date End Date Taking? Authorizing Provider  aspirin 325 MG tablet Take 1 tablet (325 mg total) by mouth daily. 01/05/14   Kathie Dike, MD  esomeprazole (NEXIUM) 40 MG capsule Take 1 capsule (40 mg  total) by mouth daily at 12 noon. 08/15/14   Carlis Stable, NP  fish oil-omega-3 fatty acids 1000 MG capsule Take 2 g by mouth 2 (two) times daily.     Historical Provider, MD  Insulin Glargine (TOUJEO SOLOSTAR) 300 UNIT/ML SOPN Inject 30 Units into the skin at bedtime.     Historical Provider, MD  lisinopril-hydrochlorothiazide (PRINZIDE,ZESTORETIC) 20-12.5 MG tablet TAKE ONE TABLET BY MOUTH EVERY MORNING 12/29/14   Satira Sark, MD  metFORMIN (GLUCOPHAGE) 1000 MG tablet  06/23/14   Historical Provider, MD  metoprolol tartrate (LOPRESSOR) 25 MG tablet Take 25 mg by mouth 2 (two) times daily.    Historical Provider, MD  simvastatin (ZOCOR) 40 MG tablet TAKE ONE TABLET (40 MG) BY MOUTH EVERY MORNING 12/29/14   Satira Sark, MD    Family History Family History  Problem Relation Age of Onset  . Colon polyps Mother   . Coronary artery disease Father     CABG in his 42's  . Colon cancer Other     maternal great aunt  . Colon cancer Other     paternal great aunt    Social History Social History  Substance Use Topics  . Smoking status: Current Every Day Smoker    Packs/day: 0.50    Years: 30.00    Types: Cigarettes  . Smokeless tobacco: Current User    Types: Chew     Comment: vapor cig. 4-5 ciggs per day  . Alcohol use 0.0 oz/week     Comment: Socially; Daily tomato juice and beer (one only)     Allergies   Review of patient's allergies indicates no known allergies.   Review of Systems Review of Systems  Cardiovascular: Positive for chest pain.  Gastrointestinal: Negative for nausea.  All other systems reviewed and are negative.   Physical Exam Updated Vital Signs There were no vitals taken for this visit.  Physical Exam  Constitutional: He is oriented to person, place, and time. He appears well-developed and well-nourished.  HENT:  Head: Normocephalic and atraumatic.  Eyes: EOM are normal.  Neck: Normal range of motion.  Cardiovascular: Normal rate, regular  rhythm, normal heart sounds and intact distal pulses.   Pulmonary/Chest: Effort normal and breath sounds normal. No respiratory distress.  Abdominal: Soft. He exhibits no distension. There is no tenderness.  Musculoskeletal: Normal range of motion.  Neurological: He is alert and oriented to person, place, and time.  Skin: Skin is warm and dry.  Psychiatric: He has a normal mood and affect. Judgment normal.  Nursing note and vitals reviewed.    ED Treatments / Results  DIAGNOSTIC STUDIES:  Oxygen Saturation is 98% on RA, normal by my interpretation.    COORDINATION OF CARE:  11:46 AM Will order BMP, CBC, and troponin.  Discussed treatment plan with pt at bedside and pt agreed to plan.  Labs (all labs ordered are listed, but only abnormal results are displayed) Labs Reviewed - No data to display  EKG  EKG Interpretation  Date/Time:    Ventricular Rate:  87 PR Interval:  QRS Duration: 85 QT Interval:  364 QTC Calculation: 438 R Axis:   45 Text Interpretation:  Sinus rhythm Confirmed by   MD,  ([id number]) on 10/10/2015 1:18:31 PM       Radiology No results found.  Procedures Procedures (including critical care time)  Medications Ordered in ED Medications - No data to display   Initial Impression / Assessment and Plan / ED Course  I have reviewed the triage vital signs and the nursing notes.  Pertinent labs & imaging results that were available during my care of the patient were reviewed by me and considered in my medical decision making (see chart for details).  Clinical Course      Final Clinical Impressions(s) / ED Diagnoses   Final diagnoses:  None   Patient presents here with complaints of left upper chest/shoulder pain that started while at work. 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 While he was doing this he developed a series of sharp stabbing pains to his left upper chest and shoulder. These lasted for several seconds, then  resolved. His symptoms are atypical for cardiac pain and his EKG and troponin are negative. Chest x-ray is clear. He has been observed in the ER and has had no additional symptoms. I highly doubt a cardiac etiology and he appears stable otherwise. I feel comfortable with discharging him, to return as needed if his symptoms worsen.  I personally performed the services described in this documentation, which was scribed in my presence. The recorded information has been reviewed and is accurate.      New Prescriptions New Prescriptions   No medications on file     Jimmy Speak, MD 10/10/15 1321

## 2015-10-10 NOTE — ED Triage Notes (Signed)
Pt c/o one episode sharp cp at 1030 lasting approximately 15 minutes. Pt reports nurse at his work place assessed him and told him to come to ED due to elevated bp. Pt denies cp at present. Nad noted.

## 2016-01-10 ENCOUNTER — Encounter (HOSPITAL_COMMUNITY): Payer: Self-pay | Admitting: Emergency Medicine

## 2016-01-10 ENCOUNTER — Emergency Department (HOSPITAL_COMMUNITY)
Admission: EM | Admit: 2016-01-10 | Discharge: 2016-01-10 | Disposition: A | Discharge status: Home / Self Care | Payer: BLUE CROSS/BLUE SHIELD | Attending: Emergency Medicine | Admitting: Emergency Medicine

## 2016-01-10 DIAGNOSIS — Z7982 Long term (current) use of aspirin: Secondary | ICD-10-CM | POA: Insufficient documentation

## 2016-01-10 DIAGNOSIS — I2510 Atherosclerotic heart disease of native coronary artery without angina pectoris: Secondary | ICD-10-CM | POA: Insufficient documentation

## 2016-01-10 DIAGNOSIS — F17210 Nicotine dependence, cigarettes, uncomplicated: Secondary | ICD-10-CM | POA: Insufficient documentation

## 2016-01-10 DIAGNOSIS — I10 Essential (primary) hypertension: Secondary | ICD-10-CM | POA: Insufficient documentation

## 2016-01-10 DIAGNOSIS — J449 Chronic obstructive pulmonary disease, unspecified: Secondary | ICD-10-CM | POA: Insufficient documentation

## 2016-01-10 DIAGNOSIS — F17220 Nicotine dependence, chewing tobacco, uncomplicated: Secondary | ICD-10-CM | POA: Insufficient documentation

## 2016-01-10 DIAGNOSIS — Z79899 Other long term (current) drug therapy: Secondary | ICD-10-CM | POA: Insufficient documentation

## 2016-01-10 DIAGNOSIS — R739 Hyperglycemia, unspecified: Secondary | ICD-10-CM

## 2016-01-10 DIAGNOSIS — E11319 Type 2 diabetes mellitus with unspecified diabetic retinopathy without macular edema: Secondary | ICD-10-CM | POA: Insufficient documentation

## 2016-01-10 DIAGNOSIS — E1165 Type 2 diabetes mellitus with hyperglycemia: Secondary | ICD-10-CM | POA: Insufficient documentation

## 2016-01-10 LAB — URINALYSIS, ROUTINE W REFLEX MICROSCOPIC
Bilirubin Urine: NEGATIVE
Glucose, UA: >1000 mg/dL — AB
Ketones, ur: NEGATIVE mg/dL
Leukocytes, UA: NEGATIVE
Nitrite: NEGATIVE
Protein, ur: NEGATIVE mg/dL
Specific Gravity, Urine: 1.015 (ref 1.005–1.030)
pH: 5.5 (ref 5.0–8.0)

## 2016-01-10 LAB — CBC
HCT: 44.9 % (ref 39.0–52.0)
Hemoglobin: 16.7 g/dL (ref 13.0–17.0)
MCH: 34.9 pg — ABNORMAL HIGH (ref 26.0–34.0)
MCHC: 37.2 g/dL — ABNORMAL HIGH (ref 30.0–36.0)
MCV: 93.9 fL (ref 78.0–100.0)
Platelets: 335 10*3/uL (ref 150–400)
RBC: 4.78 MIL/uL (ref 4.22–5.81)
RDW: 12.7 % (ref 11.5–15.5)
WBC: 8.7 10*3/uL (ref 4.0–10.5)

## 2016-01-10 LAB — BASIC METABOLIC PANEL
Anion gap: 14 (ref 5–15)
BUN: 21 mg/dL — ABNORMAL HIGH (ref 6–20)
CO2: 23 mmol/L (ref 22–32)
Calcium: 9.4 mg/dL (ref 8.9–10.3)
Chloride: 94 mmol/L — ABNORMAL LOW (ref 101–111)
Creatinine, Ser: 1.01 mg/dL (ref 0.61–1.24)
GFR calc Af Amer: >60 mL/min (ref >60)
GFR calc non Af Amer: >60 mL/min (ref >60)
Glucose, Bld: 418 mg/dL — ABNORMAL HIGH (ref 65–99)
Potassium: 3.6 mmol/L (ref 3.5–5.1)
Sodium: 131 mmol/L — ABNORMAL LOW (ref 135–145)

## 2016-01-10 LAB — URINE MICROSCOPIC-ADD ON

## 2016-01-10 LAB — CBG MONITORING, ED
Glucose-Capillary: 430 mg/dL — ABNORMAL HIGH (ref 65–99)
Glucose-Capillary: 441 mg/dL — ABNORMAL HIGH (ref 65–99)
Glucose-Capillary: 233 mg/dL — ABNORMAL HIGH (ref 65–99)

## 2016-01-10 MED ORDER — LISINOPRIL-HYDROCHLOROTHIAZIDE 20-12.5 MG PO TABS: 1.0000 | ORAL_TABLET | Freq: Every morning | ORAL | 3 refills | Status: DC

## 2016-01-10 MED ORDER — SODIUM CHLORIDE 0.9 % IV BOLUS (SEPSIS)
1000.0000 mL | Freq: Once | INTRAVENOUS | Status: AC
Start: 2016-01-10 — End: 2016-01-10
  Administered 2016-01-10: 1000 mL via INTRAVENOUS

## 2016-01-10 MED ORDER — INSULIN ASPART 100 UNIT/ML ~~LOC~~ SOLN
5.0000 [IU] | Freq: Once | SUBCUTANEOUS | Status: AC
  Administered 2016-01-10: 5 [IU] via INTRAVENOUS
  Filled 2016-01-10: qty 1

## 2016-01-10 MED ORDER — METFORMIN HCL 500 MG PO TABS: 500.0000 mg | ORAL_TABLET | Freq: Two times a day (BID) | ORAL | 3 refills | Status: DC

## 2016-01-10 NOTE — ED Notes (Signed)
POC cbg 430.

## 2016-01-10 NOTE — ED Provider Notes (Signed)
[place] DEPT Provider Note   CSN: [id number] Arrival date & time: 01/10/16  1832  By signing my name below, I, Jimmy Duran, attest that this documentation has been prepared under the direction and in the presence of No att. providers found. Electronically signed, Jimmy Duran, ED Scribe. 01/10/16. 9:16 PM.  History   Chief Complaint Chief Complaint  Patient presents with  . Hyperglycemia    HPI HPI Comments: Jimmy Duran is a 60 y.o. male with Hx of HTN. HDL, CAD, Stroke, who presents to the Emergency Department complaining of hyperglycemia that started 3 days ago. He complains of urinary frequency, polyphagia and polydipsia. Pt also complains of intermittent blurry vision that has been persistent for the past 2 months. Pt normally takes lisinopril-hydrochlorothiazide but has not taken his medication since August due to being laid off. He has not been seen by the health department here in [place]. His CBG here in the ED was 430 taken at 1850. No fever.   The history is provided by the patient. No language interpreter was used.    Past Medical History:  Diagnosis Date  . Arthritis   . Carotid artery disease ([place])   . Colon polyp   . COPD (chronic obstructive pulmonary disease) ([place])   . Coronary artery disease    Nonobstructive  . Diabetes mellitus   . Essential hypertension, benign   . GERD (gastroesophageal reflux disease)   . Hemorrhoids   . Hypertension   . Mixed hyperlipidemia   . Stroke [facility])    "ministrokes"  . Type 2 diabetes mellitus [facility])     Patient Active Problem List   Diagnosis Date Noted  . Diarrhea 08/15/2014  . Dizziness 01/04/2014  . Transient ischemic attack 01/04/2014  . Palpitations 04/22/2013  . Esophageal dysphagia 07/02/2012  . OA (osteoarthritis) of knee 06/17/2011  . GERD (gastroesophageal reflux disease) 05/04/2010  . Diabetes ([place]) 05/04/2010  . COPD (chronic obstructive pulmonary disease) ([place]) 05/04/2010  . Hemorrhoids  05/04/2010  . Mixed hyperlipidemia 04/25/2010  . Tobacco use disorder 04/25/2010  . Essential hypertension, benign 04/25/2010  . CORONARY ATHEROSCLEROSIS NATIVE CORONARY ARTERY 04/25/2010  . CAROTID ARTERY DISEASE 04/25/2010    Past Surgical History:  Procedure Laterality Date  . APPENDECTOMY  1970  . BREAST SURGERY     Cyst resection on the right  . CHOLECYSTECTOMY  1993  . COLONOSCOPY  02/12/2007   [id number] friable anal canal hemorrhoids, otherwise normal rectum and colon  . COLONOSCOPY N/A 07/16/2012   RMR: Colonic diverticulosis. colinic polyp-removed as described above.   . ESOPHAGOGASTRODUODENOSCOPY (EGD) WITH ESOPHAGEAL DILATION N/A 07/16/2012   RMR: Abnormal esophagus suspicious for Candida-status pos KOH brushing. Erosive reflux esophagiits. Schatizi's ring day status post biopsy. Hiatal hernia. Antral erosions status post biopsy.   Marland Kitchen KNEE SURGERY     Left knee arthroscopy torn medial meniscus grade 4 chondral changes medial femoral condyle tibial plateau  . TOTAL KNEE ARTHROPLASTY Left 04/27/2012   Procedure: TOTAL KNEE ARTHROPLASTY;  Surgeon: Carole Civil, MD;  Location: [facility] ORS;  Service: Orthopedics;  Laterality: Left;  . [place] Medications    Prior to Admission medications   Medication Sig Start Date End Date Taking? Authorizing Provider  aspirin 325 MG tablet Take 1 tablet (325 mg total) by mouth daily. 01/05/14  Yes Kathie Dike, MD  Aspirin-Salicylamide-Caffeine (BC HEADACHE POWDER PO) Take 1 Package by mouth daily as needed (headache).   Yes Historical Provider, MD  fish oil-omega-3 fatty acids 1000 MG capsule Take 2 g by mouth 2 (two) times daily.    Yes Historical Provider, MD  simvastatin (ZOCOR) 40 MG tablet TAKE ONE TABLET (40 MG) BY MOUTH EVERY MORNING 12/29/14  Yes Satira Sark, MD  dimenhyDRINATE (DRAMAMINE) 50 MG tablet Take 50 mg by mouth daily.    Historical Provider, MD  Insulin Glargine (TOUJEO  SOLOSTAR) 300 UNIT/ML SOPN Inject 30 Units into the skin at bedtime.     Historical Provider, MD  lisinopril-hydrochlorothiazide (PRINZIDE,ZESTORETIC) 20-12.5 MG tablet Take 1 tablet by mouth every morning. 01/10/16   Varney Biles, MD  metFORMIN (GLUCOPHAGE) 500 MG tablet Take 1 tablet (500 mg total) by mouth 2 (two) times daily with a meal. 01/10/16   Varney Biles, MD    Family History Family History  Problem Relation Age of Onset  . Colon polyps Mother   . Coronary artery disease Father     CABG in his 63's  . Colon cancer Other     maternal great aunt  . Colon cancer Other     paternal great aunt    Social History Social History  Substance Use Topics  . Smoking status: Current Every Day Smoker    Packs/day: 0.50    Years: 30.00    Types: Cigarettes  . Smokeless tobacco: Current User    Types: Chew     Comment: vapor cig. 4-5 ciggs per day  . Alcohol use 0.0 oz/week     Comment: Socially; Daily tomato juice and beer (one only)     Allergies   Patient has no known allergies.   Review of Systems Review of Systems A complete 10 system review of systems was obtained and all systems are negative except as noted in the HPI and PMH.    Physical Exam Updated Vital Signs BP 132/94   Pulse 103   Temp 98.1 F (36.7 C) (Oral)   Resp 17   Ht 5\' 7"  (1.702 m)   Wt 170 lb (77.1 kg)   SpO2 98%   BMI 26.63 kg/m   Physical Exam  Constitutional: He is oriented to person, place, and time. He appears well-developed and well-nourished.  HENT:  Head: Normocephalic and atraumatic.  Eyes: Conjunctivae are normal.  Neck: Neck supple.  Cardiovascular: Normal rate and regular rhythm.   Pulmonary/Chest: Effort normal and breath sounds normal.  Lungs CTA  Abdominal: Soft. Bowel sounds are normal.  Musculoskeletal: Normal range of motion.  Neurological: He is alert and oriented to person, place, and time. No cranial nerve deficit.  Cranial nerves 2-12 intact  Skin: Skin is warm  and dry.  Psychiatric: He has a normal mood and affect. His behavior is normal.  Nursing note and vitals reviewed.    ED Treatments / Results  DIAGNOSTIC STUDIES: Oxygen Saturation is 97% on RA, normal by my interpretation.  COORDINATION OF CARE: 9:13 PM-Will order medication and CBC. Discussed treatment plan with pt at bedside and pt agreed to plan.   Labs (all labs ordered are listed, but only abnormal results are displayed) Labs Reviewed  BASIC METABOLIC PANEL - Abnormal; Notable for the following:       Result Value   Sodium 131 (*)    Chloride 94 (*)    Glucose, Bld 418 (*)    BUN 21 (*)    All other components within normal limits  CBC - Abnormal; Notable for the following:    MCH 34.9 (*)    MCHC 37.2 (*)  All other components within normal limits  URINALYSIS, ROUTINE W REFLEX MICROSCOPIC (NOT AT [facility]) - Abnormal; Notable for the following:    Glucose, UA >1000 (*)    Hgb urine dipstick TRACE (*)    All other components within normal limits  URINE MICROSCOPIC-ADD ON - Abnormal; Notable for the following:    Squamous Epithelial / LPF 0-5 (*)    Bacteria, UA RARE (*)    All other components within normal limits  CBG MONITORING, ED - Abnormal; Notable for the following:    Glucose-Capillary 430 (*)    All other components within normal limits  CBG MONITORING, ED - Abnormal; Notable for the following:    Glucose-Capillary 441 (*)    All other components within normal limits  CBG MONITORING, ED - Abnormal; Notable for the following:    Glucose-Capillary 233 (*)    All other components within normal limits  CBG MONITORING, ED    EKG  EKG Interpretation None       Radiology No results found.  Procedures Procedures (including critical care time)  Medications Ordered in ED Medications  sodium chloride 0.9 % bolus 1,000 mL (0 mLs Intravenous Stopped 01/10/16 2226)  insulin aspart (novoLOG) injection 5 Units (5 Units Intravenous Given 01/10/16 2112)      Initial Impression / Assessment and Plan / ED Course  I have reviewed the triage vital signs and the nursing notes.  Pertinent labs & imaging results that were available during my care of the patient were reviewed by me and considered in my medical decision making (see chart for details).  Clinical Course     I personally performed the services described in this documentation, which was scribed in my presence. The recorded information has been reviewed and is accurate.  Hyperglycemia - due to non compliance because of lack of insurance.  Will give metformin rx and BP meds rx. Likely having retinopathy due to poorly controlled DM.  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

## 2016-01-10 NOTE — ED Triage Notes (Addendum)
Pt reports has been without hyperglycemia medication since august.Pt reports blurred vision x2 months. Pt reports urinary frequency, polyphagia and polydipsia. nad noted.

## 2016-01-10 NOTE — ED Notes (Signed)
ED Provider at bedside. 

## 2016-03-04 HISTORY — PX: EYE SURGERY: SHX253

## 2016-06-11 ENCOUNTER — Encounter: Payer: Self-pay | Admitting: *Deleted

## 2016-06-11 NOTE — Pre-Procedure Instructions (Signed)
Contacted patient. No longer on insulin since lost job and can't afford. Sugars running high on metformin. As instructed by dr j Andree Elk, patient not to stop metformin until am surgery

## 2016-06-18 ENCOUNTER — Ambulatory Visit: Payer: Self-pay | Admitting: Anesthesiology

## 2016-06-18 ENCOUNTER — Encounter: Payer: Self-pay | Admitting: *Deleted

## 2016-06-18 ENCOUNTER — Encounter
Admission: RE | Disposition: A | Discharge status: Home / Self Care | Payer: Self-pay | Source: Ambulatory Visit | Attending: Ophthalmology

## 2016-06-18 ENCOUNTER — Ambulatory Visit
Admission: RE | Admit: 2016-06-18 | Discharge: 2016-06-18 | Disposition: A | Discharge status: Home / Self Care | Payer: Self-pay | Source: Ambulatory Visit | Attending: Ophthalmology | Admitting: Ophthalmology

## 2016-06-18 DIAGNOSIS — K219 Gastro-esophageal reflux disease without esophagitis: Secondary | ICD-10-CM | POA: Insufficient documentation

## 2016-06-18 DIAGNOSIS — J449 Chronic obstructive pulmonary disease, unspecified: Secondary | ICD-10-CM | POA: Insufficient documentation

## 2016-06-18 DIAGNOSIS — F17200 Nicotine dependence, unspecified, uncomplicated: Secondary | ICD-10-CM | POA: Insufficient documentation

## 2016-06-18 DIAGNOSIS — H2511 Age-related nuclear cataract, right eye: Secondary | ICD-10-CM | POA: Insufficient documentation

## 2016-06-18 DIAGNOSIS — Z8673 Personal history of transient ischemic attack (TIA), and cerebral infarction without residual deficits: Secondary | ICD-10-CM | POA: Insufficient documentation

## 2016-06-18 DIAGNOSIS — E7800 Pure hypercholesterolemia, unspecified: Secondary | ICD-10-CM | POA: Insufficient documentation

## 2016-06-18 DIAGNOSIS — I2510 Atherosclerotic heart disease of native coronary artery without angina pectoris: Secondary | ICD-10-CM | POA: Insufficient documentation

## 2016-06-18 DIAGNOSIS — Z96652 Presence of left artificial knee joint: Secondary | ICD-10-CM | POA: Insufficient documentation

## 2016-06-18 DIAGNOSIS — E109 Type 1 diabetes mellitus without complications: Secondary | ICD-10-CM | POA: Insufficient documentation

## 2016-06-18 DIAGNOSIS — I10 Essential (primary) hypertension: Secondary | ICD-10-CM | POA: Insufficient documentation

## 2016-06-18 DIAGNOSIS — Z79899 Other long term (current) drug therapy: Secondary | ICD-10-CM | POA: Insufficient documentation

## 2016-06-18 DIAGNOSIS — Z7984 Long term (current) use of oral hypoglycemic drugs: Secondary | ICD-10-CM | POA: Insufficient documentation

## 2016-06-18 DIAGNOSIS — Z7982 Long term (current) use of aspirin: Secondary | ICD-10-CM | POA: Insufficient documentation

## 2016-06-18 HISTORY — DX: Personal history of other specified conditions: Z87.898

## 2016-06-18 HISTORY — DX: Cardiac arrhythmia, unspecified: I49.9

## 2016-06-18 HISTORY — PX: CATARACT EXTRACTION W/PHACO: SHX586

## 2016-06-18 HISTORY — DX: Dyspnea, unspecified: R06.00

## 2016-06-18 LAB — GLUCOSE, CAPILLARY: Glucose-Capillary: 195 mg/dL — ABNORMAL HIGH (ref 65–99)

## 2016-06-18 SURGERY — PHACOEMULSIFICATION, CATARACT, WITH IOL INSERTION
Anesthesia: Monitor Anesthesia Care | Site: Eye | Laterality: Right | Wound class: Clean

## 2016-06-18 MED ORDER — FENTANYL CITRATE (PF) 100 MCG/2ML IJ SOLN
INTRAMUSCULAR | Status: AC
  Filled 2016-06-18: qty 2

## 2016-06-18 MED ORDER — POVIDONE-IODINE 10 % OINT PACKET
TOPICAL_OINTMENT | CUTANEOUS | Status: AC
  Filled 2016-06-18: qty 1

## 2016-06-18 MED ORDER — TETRACAINE HCL 0.5 % OP SOLN
OPHTHALMIC | Status: DC | PRN
  Administered 2016-06-18: 2 [drp] via OPHTHALMIC

## 2016-06-18 MED ORDER — MOXIFLOXACIN HCL 0.5 % OP SOLN: 1.0000 [drp] | OPHTHALMIC | Status: DC | PRN

## 2016-06-18 MED ORDER — MIDAZOLAM HCL 2 MG/2ML IJ SOLN
INTRAMUSCULAR | Status: DC | PRN
  Administered 2016-06-18 (×2): 1 mg via INTRAVENOUS

## 2016-06-18 MED ORDER — MOXIFLOXACIN HCL 0.5 % OP SOLN
OPHTHALMIC | Status: AC
  Filled 2016-06-18: qty 3

## 2016-06-18 MED ORDER — ARMC OPHTHALMIC DILATING DROPS
1.0000 "application " | OPHTHALMIC | Status: AC
  Administered 2016-06-18 (×3): 1 via OPHTHALMIC

## 2016-06-18 MED ORDER — SODIUM CHLORIDE 0.9 % IV SOLN
INTRAVENOUS | Status: DC
  Administered 2016-06-18: 06:00:00 via INTRAVENOUS

## 2016-06-18 MED ORDER — EPINEPHRINE PF 1 MG/ML IJ SOLN
INTRAOCULAR | Status: DC | PRN
  Administered 2016-06-18: 08:00:00 via OPHTHALMIC

## 2016-06-18 MED ORDER — NA CHONDROIT SULF-NA HYALURON 40-17 MG/ML IO SOLN
INTRAOCULAR | Status: AC
  Filled 2016-06-18: qty 1

## 2016-06-18 MED ORDER — MOXIFLOXACIN HCL 0.5 % OP SOLN
OPHTHALMIC | Status: DC | PRN
  Administered 2016-06-18: 0.2 mL via OPHTHALMIC

## 2016-06-18 MED ORDER — CARBACHOL 0.01 % IO SOLN
INTRAOCULAR | Status: DC | PRN
  Administered 2016-06-18: 0.5 mL via INTRAOCULAR

## 2016-06-18 MED ORDER — ARMC OPHTHALMIC DILATING DROPS
OPHTHALMIC | Status: AC
  Administered 2016-06-18: 1 via OPHTHALMIC
  Filled 2016-06-18: qty 0.4

## 2016-06-18 MED ORDER — FENTANYL CITRATE (PF) 100 MCG/2ML IJ SOLN
INTRAMUSCULAR | Status: DC | PRN
  Administered 2016-06-18: 50 ug via INTRAVENOUS
  Administered 2016-06-18 (×2): 25 ug via INTRAVENOUS

## 2016-06-18 MED ORDER — TETRACAINE HCL 0.5 % OP SOLN
OPHTHALMIC | Status: AC
  Filled 2016-06-18: qty 2

## 2016-06-18 MED ORDER — NA CHONDROIT SULF-NA HYALURON 40-17 MG/ML IO SOLN
INTRAOCULAR | Status: DC | PRN
  Administered 2016-06-18: 1 mL via INTRAOCULAR

## 2016-06-18 MED ORDER — LIDOCAINE HCL (PF) 4 % IJ SOLN
INTRAOCULAR | Status: DC | PRN
  Administered 2016-06-18: 4 mL via OPHTHALMIC

## 2016-06-18 MED ORDER — EPINEPHRINE PF 1 MG/ML IJ SOLN
INTRAMUSCULAR | Status: AC
  Filled 2016-06-18: qty 2

## 2016-06-18 MED ORDER — MIDAZOLAM HCL 2 MG/2ML IJ SOLN
INTRAMUSCULAR | Status: AC
  Filled 2016-06-18: qty 2

## 2016-06-18 MED ORDER — POVIDONE-IODINE 5 % OP SOLN
OPHTHALMIC | Status: DC | PRN
  Administered 2016-06-18: 1 via OPHTHALMIC

## 2016-06-18 SURGICAL SUPPLY — 22 items
CANNULA ANT/CHMB 27G (MISCELLANEOUS) ×1 IMPLANT
CANNULA ANT/CHMB 27GA (MISCELLANEOUS) ×3 IMPLANT
CUP MEDICINE 2OZ PLAST GRAD ST (MISCELLANEOUS) ×3 IMPLANT
GLOVE BIO SURGEON STRL SZ8 (GLOVE) ×3 IMPLANT
GLOVE BIOGEL M 6.5 STRL (GLOVE) ×3 IMPLANT
GLOVE SURG LX 8.0 MICRO (GLOVE) ×2
GLOVE SURG LX STRL 8.0 MICRO (GLOVE) ×1 IMPLANT
GOWN STRL REUS W/ TWL LRG LVL3 (GOWN DISPOSABLE) ×2 IMPLANT
GOWN STRL REUS W/TWL LRG LVL3 (GOWN DISPOSABLE) ×6
LENS IOL TECNIS ITEC 20.0 (Intraocular Lens) ×2 IMPLANT
PACK CATARACT (MISCELLANEOUS) ×3 IMPLANT
PACK CATARACT BRASINGTON LX (MISCELLANEOUS) ×3 IMPLANT
PACK EYE AFTER SURG (MISCELLANEOUS) ×3 IMPLANT
SOL BSS BAG (MISCELLANEOUS) ×3
SOL PREP PVP 2OZ (MISCELLANEOUS) ×3
SOLUTION BSS BAG (MISCELLANEOUS) ×1 IMPLANT
SOLUTION PREP PVP 2OZ (MISCELLANEOUS) ×1 IMPLANT
SYR 3ML LL SCALE MARK (SYRINGE) ×3 IMPLANT
SYR 5ML LL (SYRINGE) ×3 IMPLANT
SYR TB 1ML 27GX1/2 LL (SYRINGE) ×3 IMPLANT
WATER STERILE IRR 250ML POUR (IV SOLUTION) ×3 IMPLANT
WIPE NON LINTING 3.25X3.25 (MISCELLANEOUS) ×3 IMPLANT

## 2016-06-18 NOTE — Op Note (Signed)
PREOPERATIVE DIAGNOSIS:  Nuclear sclerotic cataract of the right eye.   POSTOPERATIVE DIAGNOSIS:  NUCLEAR SCLEROTIC CATARACT RIGHT EYE   OPERATIVE PROCEDURE: Procedure(s): CATARACT EXTRACTION PHACO AND INTRAOCULAR LENS PLACEMENT (IOC)   SURGEON:  [name], MD.   ANESTHESIA:  Anesthesiologist: Gijsbertus Lonia Mad, MD CRNA: Hedda Slade, CRNA  1.      Managed anesthesia care. 2.      0.26ml of Shugarcaine was instilled in the eye following the paracentesis.   COMPLICATIONS:  None.   TECHNIQUE:   Stop and chop   DESCRIPTION OF PROCEDURE:  The patient was examined and consented in the preoperative holding area where the aforementioned topical anesthesia was applied to the right eye and then brought back to the Operating Room where the right eye was prepped and draped in the usual sterile ophthalmic fashion and a lid speculum was placed. A paracentesis was created with the side port blade and the anterior chamber was filled with viscoelastic. A near clear corneal incision was performed with the steel keratome. A continuous curvilinear capsulorrhexis was performed with a cystotome followed by the capsulorrhexis forceps. Hydrodissection and hydrodelineation were carried out with BSS on a blunt cannula. The lens was removed in a stop and chop  technique and the remaining cortical material was removed with the irrigation-aspiration handpiece. The capsular bag was inflated with viscoelastic and the Technis [id number]  lens was placed in the capsular bag without complication. The remaining viscoelastic was removed from the eye with the irrigation-aspiration handpiece. The wounds were hydrated. The anterior chamber was flushed with Miostat and the eye was inflated to physiologic pressure. 0.16ml of Vigamox was placed in the anterior chamber. The wounds were found to be water tight. The eye was dressed with Vigamox. The patient was given protective glasses to wear throughout the day and a shield with  which to sleep tonight. The patient was also given drops with which to begin a drop regimen today and will follow-up with me in one day.  Implant Name Type Inv. Item Serial No. Manufacturer Lot No. LRB No. Used  LENS IOL DIOP 20.0 - [id number] Intraocular Lens LENS IOL DIOP 20.0 [id number]   Right 1   Procedure(s) with comments: CATARACT EXTRACTION PHACO AND INTRAOCULAR LENS PLACEMENT (IOC) (Right) - [name] 35.7 AP% 16.6 CDE 5.94 Fluid pack lot # [id number]  Electronically signed: ,Kerman LOUIS 06/18/2016 7:59 AM

## 2016-06-18 NOTE — Anesthesia Preprocedure Evaluation (Signed)
Anesthesia Evaluation  Patient identified by MRN, date of birth, ID band Patient awake    Reviewed: Allergy & Precautions, NPO status , Patient's Chart, lab work & pertinent test results  Airway Mallampati: II       Dental  (+) Upper Dentures, Lower Dentures   Pulmonary shortness of breath, COPD, Current Smoker,     + decreased breath sounds      Cardiovascular Exercise Tolerance: Good hypertension, Pt. on medications + CAD and + Orthopnea  + dysrhythmias  Rhythm:Regular     Neuro/Psych TIA   GI/Hepatic Neg liver ROS, GERD  Medicated,  Endo/Other  diabetes, Type 1, Oral Hypoglycemic Agents  Renal/GU negative Renal ROS     Musculoskeletal   Abdominal Normal abdominal exam  (+)   Peds  Hematology   Anesthesia Other Findings   Reproductive/Obstetrics                             Anesthesia Physical Anesthesia Plan  ASA: III  Anesthesia Plan: MAC   Post-op Pain Management:    Induction: Intravenous  Airway Management Planned: Natural Airway and Nasal Cannula  Additional Equipment:   Intra-op Plan:   Post-operative Plan:   Informed Consent: I have reviewed the patients History and Physical, chart, labs and discussed the procedure including the risks, benefits and alternatives for the proposed anesthesia with the patient or authorized representative who has indicated his/her understanding and acceptance.     Plan Discussed with: CRNA  Anesthesia Plan Comments:         Anesthesia Quick Evaluation

## 2016-06-18 NOTE — Anesthesia Procedure Notes (Signed)
Procedure Name: MAC Date/Time: 06/18/2016 7:30 AM Performed by: Hedda Slade Pre-anesthesia Checklist: Patient identified, Emergency Drugs available, Suction available and Patient being monitored Patient Re-evaluated:Patient Re-evaluated prior to inductionOxygen Delivery Method: Nasal cannula

## 2016-06-18 NOTE — Anesthesia Post-op Follow-up Note (Cosign Needed)
Anesthesia QCDR form completed.        

## 2016-06-18 NOTE — Discharge Instructions (Signed)
Eye Surgery Discharge Instructions  Expect mild scratchy sensation or mild soreness. DO NOT RUB YOUR EYE!  The day of surgery:  Minimal physical activity, but bed rest is not required  No reading, computer work, or close hand work  No bending, lifting, or straining.  May watch TV  For 24 hours:  No driving, legal decisions, or alcoholic beverages  Safety precautions  Eat anything you prefer: It is better to start with liquids, then soup then solid foods.  _____ Eye patch should be worn until postoperative exam tomorrow.  ____ Solar shield eyeglasses should be worn for comfort in the sunlight/patch while sleeping  Resume all regular medications including aspirin or Coumadin if these were discontinued prior to surgery. You may shower, bathe, shave, or wash your hair. Tylenol may be taken for mild discomfort.  Call your doctor if you experience significant pain, nausea, or vomiting, fever > 101 or other signs of infection. [phone] or [phone] Specific instructions:  Follow-up Information    PORFILIO,Dorean LOUIS, MD Follow up.   Specialty:  Ophthalmology Why:  06-19-16 at 10:40 Contact information: [address] [place] [address] [phone]          Eye Surgery Discharge Instructions  Expect mild scratchy sensation or mild soreness. DO NOT RUB YOUR EYE!  The day of surgery:  Minimal physical activity, but bed rest is not required  No reading, computer work, or close hand work  No bending, lifting, or straining.  May watch TV  For 24 hours:  No driving, legal decisions, or alcoholic beverages  Safety precautions  Eat anything you prefer: It is better to start with liquids, then soup then solid foods.  _____ Eye patch should be worn until postoperative exam tomorrow.  ____ Solar shield eyeglasses should be worn for comfort in the sunlight/patch while sleeping  Resume all regular medications including aspirin or Coumadin if these  were discontinued prior to surgery. You may shower, bathe, shave, or wash your hair. Tylenol may be taken for mild discomfort.  Call your doctor if you experience significant pain, nausea, or vomiting, fever > 101 or other signs of infection. [phone] or [phone] Specific instructions:  Follow-up Information    PORFILIO,Kekoa LOUIS, MD Follow up.   Specialty:  Ophthalmology Why:  06-19-16 at 10:40 Contact information: [place] [address] [phone]

## 2016-06-18 NOTE — H&P (Signed)
All labs reviewed. Abnormal studies sent to patients PCP when indicated.  Previous H&P reviewed, patient examined, there are NO CHANGES.  ,Jimmy LOUIS4/17/20187:15 AM

## 2016-06-18 NOTE — Anesthesia Postprocedure Evaluation (Signed)
Anesthesia Post Note  Patient: [facility]  Procedure(s) Performed: Procedure(s) (LRB): CATARACT EXTRACTION PHACO AND INTRAOCULAR LENS PLACEMENT (IOC) (Right)  Patient location during evaluation: PACU Anesthesia Type: MAC Level of consciousness: awake, awake and alert and oriented Pain management: pain level controlled Vital Signs Assessment: post-procedure vital signs reviewed and stable Respiratory status: spontaneous breathing Cardiovascular status: blood pressure returned to baseline Postop Assessment: no headache, no backache and no signs of nausea or vomiting Anesthetic complications: no     Last Vitals:  Vitals:   06/18/16 0606 06/18/16 0801  BP: (!) 137/92 109/78  Pulse: 87 86  Resp: 18 12  Temp: 36.7 C 36.1 C    Last Pain:  Vitals:   06/18/16 0801  TempSrc: Tympanic  PainSc:                  Hedda Slade

## 2016-06-18 NOTE — Transfer of Care (Signed)
Immediate Anesthesia Transfer of Care Note  Patient: Jimmy Duran [facility]  Procedure(s) Performed: Procedure(s) with comments: CATARACT EXTRACTION PHACO AND INTRAOCULAR LENS PLACEMENT (IOC) (Right) - [name] 35.7 AP% 16.6 CDE 5.94 Fluid pack lot # [id number]  Patient Location: PACU  Anesthesia Type:MAC  Level of Consciousness: awake and alert   Airway & Oxygen Therapy: Patient Spontanous Breathing  Post-op Assessment: Report given to RN and Post -op Vital signs reviewed and stable  Post vital signs: Reviewed and stable  Last Vitals:  Vitals:   06/18/16 0606 06/18/16 0801  BP: (!) 137/92 109/78  Pulse: 87 86  Resp: 18 12  Temp: 36.7 C 36.1 C    Last Pain:  Vitals:   06/18/16 0801  TempSrc: Tympanic  PainSc:          Complications: No apparent anesthesia complications

## 2016-07-10 ENCOUNTER — Encounter: Payer: Self-pay | Admitting: *Deleted

## 2016-07-16 ENCOUNTER — Encounter: Payer: Self-pay | Admitting: *Deleted

## 2016-07-16 ENCOUNTER — Ambulatory Visit: Payer: Self-pay | Admitting: Certified Registered Nurse Anesthetist

## 2016-07-16 ENCOUNTER — Encounter
Admission: RE | Disposition: A | Discharge status: Home / Self Care | Payer: Self-pay | Source: Ambulatory Visit | Attending: Ophthalmology

## 2016-07-16 ENCOUNTER — Ambulatory Visit
Admission: RE | Admit: 2016-07-16 | Discharge: 2016-07-16 | Disposition: A | Discharge status: Home / Self Care | Payer: Self-pay | Source: Ambulatory Visit | Attending: Ophthalmology | Admitting: Ophthalmology

## 2016-07-16 DIAGNOSIS — F17200 Nicotine dependence, unspecified, uncomplicated: Secondary | ICD-10-CM | POA: Insufficient documentation

## 2016-07-16 DIAGNOSIS — Z79899 Other long term (current) drug therapy: Secondary | ICD-10-CM | POA: Insufficient documentation

## 2016-07-16 DIAGNOSIS — M1990 Unspecified osteoarthritis, unspecified site: Secondary | ICD-10-CM | POA: Insufficient documentation

## 2016-07-16 DIAGNOSIS — I2510 Atherosclerotic heart disease of native coronary artery without angina pectoris: Secondary | ICD-10-CM | POA: Insufficient documentation

## 2016-07-16 DIAGNOSIS — Z7984 Long term (current) use of oral hypoglycemic drugs: Secondary | ICD-10-CM | POA: Insufficient documentation

## 2016-07-16 DIAGNOSIS — E782 Mixed hyperlipidemia: Secondary | ICD-10-CM | POA: Insufficient documentation

## 2016-07-16 DIAGNOSIS — R0601 Orthopnea: Secondary | ICD-10-CM | POA: Insufficient documentation

## 2016-07-16 DIAGNOSIS — K219 Gastro-esophageal reflux disease without esophagitis: Secondary | ICD-10-CM | POA: Insufficient documentation

## 2016-07-16 DIAGNOSIS — E1036 Type 1 diabetes mellitus with diabetic cataract: Secondary | ICD-10-CM | POA: Insufficient documentation

## 2016-07-16 DIAGNOSIS — J449 Chronic obstructive pulmonary disease, unspecified: Secondary | ICD-10-CM | POA: Insufficient documentation

## 2016-07-16 DIAGNOSIS — I10 Essential (primary) hypertension: Secondary | ICD-10-CM | POA: Insufficient documentation

## 2016-07-16 HISTORY — PX: CATARACT EXTRACTION W/PHACO: SHX586

## 2016-07-16 LAB — GLUCOSE, CAPILLARY: Glucose-Capillary: 136 mg/dL — ABNORMAL HIGH (ref 65–99)

## 2016-07-16 SURGERY — PHACOEMULSIFICATION, CATARACT, WITH IOL INSERTION
Anesthesia: Monitor Anesthesia Care | Site: Eye | Laterality: Left | Wound class: Clean

## 2016-07-16 MED ORDER — EPINEPHRINE PF 1 MG/ML IJ SOLN
INTRAMUSCULAR | Status: AC
  Filled 2016-07-16: qty 2

## 2016-07-16 MED ORDER — LIDOCAINE HCL (PF) 2 % IJ SOLN
INTRAMUSCULAR | Status: AC
  Filled 2016-07-16: qty 2

## 2016-07-16 MED ORDER — ONDANSETRON HCL 4 MG/2ML IJ SOLN: 4.0000 mg | Freq: Once | INTRAMUSCULAR | Status: DC | PRN

## 2016-07-16 MED ORDER — MOXIFLOXACIN HCL 0.5 % OP SOLN: 1.0000 [drp] | OPHTHALMIC | Status: DC | PRN

## 2016-07-16 MED ORDER — NA CHONDROIT SULF-NA HYALURON 40-17 MG/ML IO SOLN
INTRAOCULAR | Status: DC | PRN
  Administered 2016-07-16: 1 mL via INTRAOCULAR

## 2016-07-16 MED ORDER — CARBACHOL 0.01 % IO SOLN
INTRAOCULAR | Status: DC | PRN
  Administered 2016-07-16: 0.5 mL via INTRAOCULAR

## 2016-07-16 MED ORDER — SODIUM CHLORIDE 0.9 % IV SOLN
INTRAVENOUS | Status: DC
  Administered 2016-07-16: 09:00:00 via INTRAVENOUS

## 2016-07-16 MED ORDER — ARMC OPHTHALMIC DILATING DROPS
OPHTHALMIC | Status: AC
  Filled 2016-07-16: qty 0.4

## 2016-07-16 MED ORDER — EPINEPHRINE PF 1 MG/ML IJ SOLN
INTRAOCULAR | Status: DC | PRN
  Administered 2016-07-16: 10:00:00 via OPHTHALMIC

## 2016-07-16 MED ORDER — FENTANYL CITRATE (PF) 100 MCG/2ML IJ SOLN: 25.0000 ug | INTRAMUSCULAR | Status: DC | PRN

## 2016-07-16 MED ORDER — FENTANYL CITRATE (PF) 100 MCG/2ML IJ SOLN
INTRAMUSCULAR | Status: DC | PRN
  Administered 2016-07-16 (×2): 25 ug via INTRAVENOUS
  Administered 2016-07-16: 50 ug via INTRAVENOUS

## 2016-07-16 MED ORDER — NA CHONDROIT SULF-NA HYALURON 40-17 MG/ML IO SOLN
INTRAOCULAR | Status: AC
  Filled 2016-07-16: qty 1

## 2016-07-16 MED ORDER — MOXIFLOXACIN HCL 0.5 % OP SOLN
OPHTHALMIC | Status: DC | PRN
  Administered 2016-07-16: 0.2 mL via OPHTHALMIC

## 2016-07-16 MED ORDER — ARMC OPHTHALMIC DILATING DROPS
1.0000 "application " | OPHTHALMIC | Status: AC
  Administered 2016-07-16 (×3): 1 via OPHTHALMIC

## 2016-07-16 MED ORDER — FENTANYL CITRATE (PF) 100 MCG/2ML IJ SOLN
INTRAMUSCULAR | Status: AC
  Filled 2016-07-16: qty 2

## 2016-07-16 MED ORDER — POVIDONE-IODINE 5 % OP SOLN
OPHTHALMIC | Status: DC | PRN
  Administered 2016-07-16: 1 via OPHTHALMIC

## 2016-07-16 MED ORDER — MOXIFLOXACIN HCL 0.5 % OP SOLN
OPHTHALMIC | Status: AC
  Filled 2016-07-16: qty 3

## 2016-07-16 MED ORDER — POVIDONE-IODINE 5 % OP SOLN
OPHTHALMIC | Status: AC
  Filled 2016-07-16: qty 30

## 2016-07-16 MED ORDER — TETRACAINE HCL 0.5 % OP SOLN
OPHTHALMIC | Status: AC
  Filled 2016-07-16: qty 2

## 2016-07-16 MED ORDER — BSS IO SOLN
INTRAOCULAR | Status: DC | PRN
  Administered 2016-07-16: 4 mL via OPHTHALMIC

## 2016-07-16 SURGICAL SUPPLY — 14 items
GLOVE BIO SURGEON STRL SZ8 (GLOVE) ×3 IMPLANT
GLOVE BIOGEL M 6.5 STRL (GLOVE) ×3 IMPLANT
GLOVE SURG LX 8.0 MICRO (GLOVE) ×2
GLOVE SURG LX STRL 8.0 MICRO (GLOVE) ×1 IMPLANT
GOWN STRL REUS W/ TWL LRG LVL3 (GOWN DISPOSABLE) ×2 IMPLANT
GOWN STRL REUS W/TWL LRG LVL3 (GOWN DISPOSABLE) ×6
LENS IOL TECNIS ITEC 20.0 (Intraocular Lens) ×2 IMPLANT
PACK CATARACT (MISCELLANEOUS) ×3 IMPLANT
PACK CATARACT BRASINGTON LX (MISCELLANEOUS) ×3 IMPLANT
SOL BSS BAG (MISCELLANEOUS) ×3
SOLUTION BSS BAG (MISCELLANEOUS) ×1 IMPLANT
SYR 5ML LL (SYRINGE) ×3 IMPLANT
WATER STERILE IRR 250ML POUR (IV SOLUTION) ×3 IMPLANT
WIPE NON LINTING 3.25X3.25 (MISCELLANEOUS) ×3 IMPLANT

## 2016-07-16 NOTE — Anesthesia Procedure Notes (Signed)
Procedure Name: MAC Performed by: Demetrius Charity Pre-anesthesia Checklist: Patient identified, Emergency Drugs available, Suction available, Patient being monitored and Timeout performed Oxygen Delivery Method: Nasal cannula

## 2016-07-16 NOTE — Op Note (Signed)
PREOPERATIVE DIAGNOSIS:  Nuclear sclerotic cataract of the left eye.   POSTOPERATIVE DIAGNOSIS:  Nuclear sclerotic cataract of the left eye.   OPERATIVE PROCEDURE: Procedure(s): CATARACT EXTRACTION PHACO AND INTRAOCULAR LENS PLACEMENT (IOC)   SURGEON:  [name], MD.   ANESTHESIA:  Anesthesiologist: Martha Clan, MD CRNA: Demetrius Charity, CRNA  1.      Managed anesthesia care. 2.     0.59ml of Shugarcaine was instilled following the paracentesis   COMPLICATIONS:  None.   TECHNIQUE:   Stop and chop   DESCRIPTION OF PROCEDURE:  The patient was examined and consented in the preoperative holding area where the aforementioned topical anesthesia was applied to the left eye and then brought back to the Operating Room where the left eye was prepped and draped in the usual sterile ophthalmic fashion and a lid speculum was placed. A paracentesis was created with the side port blade and the anterior chamber was filled with viscoelastic. A near clear corneal incision was performed with the steel keratome. A continuous curvilinear capsulorrhexis was performed with a cystotome followed by the capsulorrhexis forceps. Hydrodissection and hydrodelineation were carried out with BSS on a blunt cannula. The lens was removed in a stop and chop  technique and the remaining cortical material was removed with the irrigation-aspiration handpiece. The capsular bag was inflated with viscoelastic and the Technis [id number] lens was placed in the capsular bag without complication. The remaining viscoelastic was removed from the eye with the irrigation-aspiration handpiece. The wounds were hydrated. The anterior chamber was flushed with Miostat and the eye was inflated to physiologic pressure. 0.45ml Vigamox was placed in the anterior chamber. The wounds were found to be water tight. The eye was dressed with Vigamox. The patient was given protective glasses to wear throughout the day and a shield with which to sleep  tonight. The patient was also given drops with which to begin a drop regimen today and will follow-up with me in one day.  Implant Name Type Inv. Item Serial No. Manufacturer Lot No. LRB No. Used  LENS IOL DIOP 20.0 - [id number] Intraocular Lens LENS IOL DIOP 20.0 [id number]   Left 1    Procedure(s) with comments: CATARACT EXTRACTION PHACO AND INTRAOCULAR LENS PLACEMENT (IOC) (Left) - [name] 51.8 AP% 12.7 CDE 6.58 Fluid Pack Lot # [id number]  Electronically signed: [place] 07/16/2016 10:25 AM

## 2016-07-16 NOTE — Discharge Instructions (Signed)
Eye Surgery Discharge Instructions  Expect mild scratchy sensation or mild soreness. DO NOT RUB YOUR EYE!  The day of surgery:  Minimal physical activity, but bed rest is not required  No reading, computer work, or close hand work  No bending, lifting, or straining.  May watch TV  For 24 hours:  No driving, legal decisions, or alcoholic beverages  Safety precautions  Eat anything you prefer: It is better to start with liquids, then soup then solid foods.  _____ Eye patch should be worn until postoperative exam tomorrow.  ____ Solar shield eyeglasses should be worn for comfort in the sunlight/patch while sleeping  Resume all regular medications including aspirin or Coumadin if these were discontinued prior to surgery. You may shower, bathe, shave, or wash your hair. Tylenol may be taken for mild discomfort.  Call your doctor if you experience significant pain, nausea, or vomiting, fever > 101 or other signs of infection. [phone] or [phone] Specific instructions:  Follow-up Information    [name], MD Follow up.   Specialty:  Ophthalmology Why:  May 16 at 10:50am Contact information: [address] [place] [address] [phone]

## 2016-07-16 NOTE — H&P (Signed)
All labs reviewed. Abnormal studies sent to patients PCP when indicated.  Previous H&P reviewed, patient examined, there are NO CHANGES.  ,Jimmy LOUIS5/15/201810:00 AM

## 2016-07-16 NOTE — Transfer of Care (Signed)
Immediate Anesthesia Transfer of Care Note  Patient: Jimmy Duran [facility]  Procedure(s) Performed: Procedure(s) with comments: CATARACT EXTRACTION PHACO AND INTRAOCULAR LENS PLACEMENT (IOC) (Left) - [name] 51.8 AP% 12.7 CDE 6.58 Fluid Pack Lot # [id number]  Patient Location: PACU  Anesthesia Type:MAC  Level of Consciousness: awake, alert  and oriented  Airway & Oxygen Therapy: Patient Spontanous Breathing  Post-op Assessment: Report given to RN and Post -op Vital signs reviewed and stable  Post vital signs: Reviewed and stable  Last Vitals:  Vitals:   07/16/16 0843  BP: 126/88  Pulse: 95  Resp: 18  Temp: 36.6 C    Last Pain:  Vitals:   07/16/16 0843  TempSrc: Oral         Complications: No apparent anesthesia complications

## 2016-07-16 NOTE — Anesthesia Post-op Follow-up Note (Cosign Needed)
Anesthesia QCDR form completed.        

## 2016-07-16 NOTE — Anesthesia Preprocedure Evaluation (Signed)
Anesthesia Evaluation  Patient identified by MRN, date of birth, ID band Patient awake    Reviewed: Allergy & Precautions, NPO status , Patient's Chart, lab work & pertinent test results  History of Anesthesia Complications Negative for: history of anesthetic complications  Airway Mallampati: II       Dental  (+) Upper Dentures, Lower Dentures   Pulmonary shortness of breath, neg sleep apnea, COPD, neg recent URI, Current Smoker,     + decreased breath sounds      Cardiovascular Exercise Tolerance: Good hypertension, Pt. on medications (-) angina+ CAD and + Orthopnea  + dysrhythmias  Rhythm:Regular     Neuro/Psych TIA   GI/Hepatic Neg liver ROS, GERD  Medicated,  Endo/Other  diabetes, Type 1, Oral Hypoglycemic Agents  Renal/GU negative Renal ROS     Musculoskeletal   Abdominal Normal abdominal exam  (+)   Peds  Hematology   Anesthesia Other Findings Past Medical History: No date: Arthritis No date: Carotid artery disease (HCC) No date: Colon polyp No date: COPD (chronic obstructive pulmonary disease) (* No date: Coronary artery disease     Comment: Nonobstructive No date: Diabetes mellitus No date: Dyspnea No date: Dysrhythmia No date: Essential hypertension, benign No date: GERD (gastroesophageal reflux disease) No date: Hemorrhoids No date: History of orthopnea No date: Hypertension No date: Mixed hyperlipidemia No date: Stroke [facility])     Comment: "ministrokes" No date: Type 2 diabetes mellitus (HCC)   Reproductive/Obstetrics negative OB ROS                             Anesthesia Physical  Anesthesia Plan  ASA: III  Anesthesia Plan: MAC   Post-op Pain Management:    Induction: Intravenous  Airway Management Planned: Natural Airway and Nasal Cannula  Additional Equipment:   Intra-op Plan:   Post-operative Plan:   Informed Consent: I have reviewed the patients  History and Physical, chart, labs and discussed the procedure including the risks, benefits and alternatives for the proposed anesthesia with the patient or authorized representative who has indicated his/her understanding and acceptance.     Plan Discussed with: CRNA  Anesthesia Plan Comments:         Anesthesia Quick Evaluation

## 2016-07-16 NOTE — Anesthesia Postprocedure Evaluation (Signed)
Anesthesia Post Note  Patient: Jimmy Duran [facility]  Procedure(s) Performed: Procedure(s) (LRB): CATARACT EXTRACTION PHACO AND INTRAOCULAR LENS PLACEMENT (IOC) (Left)  Patient location during evaluation: PACU Anesthesia Type: MAC Level of consciousness: awake and alert and oriented Pain management: pain level controlled Vital Signs Assessment: post-procedure vital signs reviewed and stable Respiratory status: respiratory function stable Cardiovascular status: blood pressure returned to baseline Anesthetic complications: no     Last Vitals:  Vitals:   07/16/16 0843  BP: 126/88  Pulse: 95  Resp: 18  Temp: 36.6 C    Last Pain:  Vitals:   07/16/16 0843  TempSrc: Oral                 Blima Singer

## 2016-08-05 ENCOUNTER — Emergency Department (HOSPITAL_COMMUNITY): Payer: Self-pay

## 2016-08-05 ENCOUNTER — Encounter (HOSPITAL_COMMUNITY): Payer: Self-pay | Admitting: *Deleted

## 2016-08-05 ENCOUNTER — Emergency Department (HOSPITAL_COMMUNITY)
Admission: EM | Admit: 2016-08-05 | Discharge: 2016-08-05 | Disposition: A | Discharge status: Home / Self Care | Payer: Self-pay | Attending: Emergency Medicine | Admitting: Emergency Medicine

## 2016-08-05 DIAGNOSIS — F17210 Nicotine dependence, cigarettes, uncomplicated: Secondary | ICD-10-CM | POA: Insufficient documentation

## 2016-08-05 DIAGNOSIS — M545 Low back pain, unspecified: Secondary | ICD-10-CM

## 2016-08-05 DIAGNOSIS — F17200 Nicotine dependence, unspecified, uncomplicated: Secondary | ICD-10-CM

## 2016-08-05 DIAGNOSIS — Z79899 Other long term (current) drug therapy: Secondary | ICD-10-CM | POA: Insufficient documentation

## 2016-08-05 DIAGNOSIS — Z7984 Long term (current) use of oral hypoglycemic drugs: Secondary | ICD-10-CM | POA: Insufficient documentation

## 2016-08-05 DIAGNOSIS — I10 Essential (primary) hypertension: Secondary | ICD-10-CM | POA: Insufficient documentation

## 2016-08-05 DIAGNOSIS — I700 Atherosclerosis of aorta: Secondary | ICD-10-CM | POA: Insufficient documentation

## 2016-08-05 DIAGNOSIS — J9811 Atelectasis: Secondary | ICD-10-CM | POA: Insufficient documentation

## 2016-08-05 DIAGNOSIS — Z7982 Long term (current) use of aspirin: Secondary | ICD-10-CM | POA: Insufficient documentation

## 2016-08-05 DIAGNOSIS — I2510 Atherosclerotic heart disease of native coronary artery without angina pectoris: Secondary | ICD-10-CM | POA: Insufficient documentation

## 2016-08-05 DIAGNOSIS — J449 Chronic obstructive pulmonary disease, unspecified: Secondary | ICD-10-CM | POA: Insufficient documentation

## 2016-08-05 DIAGNOSIS — F17220 Nicotine dependence, chewing tobacco, uncomplicated: Secondary | ICD-10-CM | POA: Insufficient documentation

## 2016-08-05 DIAGNOSIS — E119 Type 2 diabetes mellitus without complications: Secondary | ICD-10-CM | POA: Insufficient documentation

## 2016-08-05 DIAGNOSIS — N281 Cyst of kidney, acquired: Secondary | ICD-10-CM | POA: Insufficient documentation

## 2016-08-05 LAB — BASIC METABOLIC PANEL
Anion gap: 11 (ref 5–15)
BUN: 19 mg/dL (ref 6–20)
CO2: 22 mmol/L (ref 22–32)
Calcium: 9.2 mg/dL (ref 8.9–10.3)
Chloride: 102 mmol/L (ref 101–111)
Creatinine, Ser: 1 mg/dL (ref 0.61–1.24)
GFR calc Af Amer: >60 mL/min (ref >60)
GFR calc non Af Amer: >60 mL/min (ref >60)
Glucose, Bld: 104 mg/dL — ABNORMAL HIGH (ref 65–99)
Potassium: 3.9 mmol/L (ref 3.5–5.1)
Sodium: 135 mmol/L (ref 135–145)

## 2016-08-05 LAB — I-STAT CHEM 8, ED
BUN: 22 mg/dL — ABNORMAL HIGH (ref 6–20)
Calcium, Ion: 1.11 mmol/L — ABNORMAL LOW (ref 1.15–1.40)
Chloride: 107 mmol/L (ref 101–111)
Creatinine, Ser: 1.1 mg/dL (ref 0.61–1.24)
Glucose, Bld: 136 mg/dL — ABNORMAL HIGH (ref 65–99)
HCT: 44 % (ref 39.0–52.0)
Hemoglobin: 15 g/dL (ref 13.0–17.0)
Potassium: 3.8 mmol/L (ref 3.5–5.1)
Sodium: 138 mmol/L (ref 135–145)
TCO2: 25 mmol/L (ref 0–100)

## 2016-08-05 LAB — CBC
HCT: 43.6 % (ref 39.0–52.0)
Hemoglobin: 15.2 g/dL (ref 13.0–17.0)
MCH: 33.3 pg (ref 26.0–34.0)
MCHC: 34.9 g/dL (ref 30.0–36.0)
MCV: 95.6 fL (ref 78.0–100.0)
Platelets: 297 10*3/uL (ref 150–400)
RBC: 4.56 MIL/uL (ref 4.22–5.81)
RDW: 12.9 % (ref 11.5–15.5)
WBC: 8 10*3/uL (ref 4.0–10.5)

## 2016-08-05 MED ORDER — OXYCODONE-ACETAMINOPHEN 5-325 MG PO TABS: 1.0000 | ORAL_TABLET | ORAL | 0 refills | Status: DC | PRN

## 2016-08-05 MED ORDER — MORPHINE SULFATE (PF) 4 MG/ML IV SOLN
4.0000 mg | Freq: Once | INTRAVENOUS | Status: AC
Start: 2016-08-05 — End: 2016-08-05
  Administered 2016-08-05: 4 mg via INTRAVENOUS
  Filled 2016-08-05: qty 1

## 2016-08-05 MED ORDER — MORPHINE SULFATE (PF) 4 MG/ML IV SOLN
4.0000 mg | Freq: Once | INTRAVENOUS | Status: AC
  Administered 2016-08-05: 4 mg via INTRAVENOUS
  Filled 2016-08-05: qty 1

## 2016-08-05 MED ORDER — MELOXICAM 15 MG PO TABS: 15.0000 mg | ORAL_TABLET | Freq: Every day | ORAL | 0 refills | Status: DC

## 2016-08-05 MED ORDER — IOPAMIDOL (ISOVUE-370) INJECTION 76%
100.0000 mL | Freq: Once | INTRAVENOUS | Status: AC | PRN
  Administered 2016-08-05: 100 mL via INTRAVENOUS

## 2016-08-05 MED ORDER — BACLOFEN 10 MG PO TABS: 10.0000 mg | ORAL_TABLET | Freq: Three times a day (TID) | ORAL | 0 refills | Status: DC

## 2016-08-05 NOTE — ED Provider Notes (Signed)
[facility] DEPT Provider Note   CSN: [id number] Arrival date & time: 08/05/16  1636     History   Chief Complaint Chief Complaint  Patient presents with  . Back Pain    HPI Jimmy Duran is a 61 y.o. male with a past medical history of vascular disease and left-sided sciatica who presents emergency Department with chief complaint of left-sided back pain. His history of intermittent self resolving pain in the low back. He states that he was working in the yard today when he had sudden onset of severe sharp, unrelenting left-sided lower back pain that radiates into his left gluteal region. He states that it "took my breath away." He denies abdominal pain or leg weakness. He denies saddle anesthesia or loss of bowel or bladder control. The patient has no previous history of kidney stones. The pain is localized to his left lower back and is worse with any movement, especially twisting or changing positions. He does not take any medications prior to arrival.  HPI  Past Medical History:  Diagnosis Date  . Arthritis   . Carotid artery disease ([place])   . Colon polyp   . COPD (chronic obstructive pulmonary disease) ([place])   . Coronary artery disease    Nonobstructive  . Diabetes mellitus   . Dyspnea   . Dysrhythmia   . Essential hypertension, benign   . GERD (gastroesophageal reflux disease)   . Hemorrhoids   . History of orthopnea   . Hypertension   . Mixed hyperlipidemia   . Stroke [facility])    "ministrokes"  . Type 2 diabetes mellitus [facility])     Patient Active Problem List   Diagnosis Date Noted  . Diarrhea 08/15/2014  . Dizziness 01/04/2014  . Transient ischemic attack 01/04/2014  . Palpitations 04/22/2013  . Esophageal dysphagia 07/02/2012  . OA (osteoarthritis) of knee 06/17/2011  . GERD (gastroesophageal reflux disease) 05/04/2010  . Diabetes ([place]) 05/04/2010  . COPD (chronic obstructive pulmonary disease) ([place]) 05/04/2010  . Hemorrhoids 05/04/2010  . Mixed  hyperlipidemia 04/25/2010  . Tobacco use disorder 04/25/2010  . Essential hypertension, benign 04/25/2010  . CORONARY ATHEROSCLEROSIS NATIVE CORONARY ARTERY 04/25/2010  . CAROTID ARTERY DISEASE 04/25/2010    Past Surgical History:  Procedure Laterality Date  . APPENDECTOMY  1970  . BREAST SURGERY     Cyst resection on the right  . CATARACT EXTRACTION W/PHACO Right 06/18/2016   Procedure: CATARACT EXTRACTION PHACO AND INTRAOCULAR LENS PLACEMENT (IOC);  Surgeon: [name], MD;  Location: [facility] ORS;  Service: Ophthalmology;  Laterality: Right;  [name] 35.7 AP% 16.6 CDE 5.94 Fluid pack lot # [id number]  . CATARACT EXTRACTION W/PHACO Left 07/16/2016   Procedure: CATARACT EXTRACTION PHACO AND INTRAOCULAR LENS PLACEMENT (IOC);  Surgeon: [name], MD;  Location: [facility] ORS;  Service: Ophthalmology;  Laterality: Left;  [name] 51.8 AP% 12.7 CDE 6.58 Fluid Pack Lot # [id number]  . CHOLECYSTECTOMY  1993  . COLONOSCOPY  02/12/2007   LAG:TXMIWOEHO friable anal canal hemorrhoids, otherwise normal rectum and colon  . COLONOSCOPY N/A 07/16/2012   RMR: Colonic diverticulosis. colinic polyp-removed as described above.   . ESOPHAGOGASTRODUODENOSCOPY (EGD) WITH ESOPHAGEAL DILATION N/A 07/16/2012   RMR: Abnormal esophagus suspicious for Candida-status pos KOH brushing. Erosive reflux esophagiits. Schatizi's ring day status post biopsy. Hiatal hernia. Antral erosions status post biopsy.   Marland Kitchen HERNIA REPAIR    . JOINT REPLACEMENT    . KNEE SURGERY     Left knee arthroscopy torn medial meniscus grade 4 chondral changes medial  femoral condyle tibial plateau  . TOTAL KNEE ARTHROPLASTY Left 04/27/2012   Procedure: TOTAL KNEE ARTHROPLASTY;  Surgeon: Carole Civil, MD;  Location: [facility]S;  Service: Orthopedics;  Laterality: Left;  . [place] Medications    Prior to Admission medications   Medication Sig Start Date End Date Taking? Authorizing Provider  aspirin 325 MG  tablet Take 1 tablet (325 mg total) by mouth daily. 01/05/14  Yes Kathie Dike, MD  Aspirin-Salicylamide-Caffeine (BC HEADACHE POWDER PO) Take 1 Package by mouth daily as needed (headache).   Yes [provider]  cetirizine (ZYRTEC) 10 MG tablet Take 10 mg by mouth daily.   Yes [provider]  dimenhyDRINATE (DRAMAMINE) 50 MG tablet Take 50 mg by mouth daily.   Yes [provider]  fish oil-omega-3 fatty acids 1000 MG capsule Take 2 g by mouth 2 (two) times daily.    Yes [provider]  lisinopril-hydrochlorothiazide (PRINZIDE,ZESTORETIC) 20-12.5 MG tablet Take 1 tablet by mouth every morning. 01/10/16  Yes Varney Biles, MD  metFORMIN (GLUCOPHAGE) 500 MG tablet Take 1 tablet (500 mg total) by mouth 2 (two) times daily with a meal. 01/10/16  Yes Nanavati, Ankit, MD  ranitidine (ZANTAC) 75 MG tablet Take 75 mg by mouth daily.   Yes [provider]  baclofen (LIORESAL) 10 MG tablet Take 1 tablet (10 mg total) by mouth 3 (three) times daily. 08/05/16   Margarita Mail, PA-C  meloxicam (MOBIC) 15 MG tablet Take 1 tablet (15 mg total) by mouth daily. Take 1 daily with food. 08/05/16   Margarita Mail, PA-C  oxyCODONE-acetaminophen (PERCOCET) 5-325 MG tablet Take 1-2 tablets by mouth every 4 (four) hours as needed. 08/05/16   Margarita Mail, PA-C    Family History Family History  Problem Relation Age of Onset  . Colon polyps Mother   . Coronary artery disease Father        CABG in his 30's  . Colon cancer Other        maternal great aunt  . Colon cancer Other        paternal great aunt    Social History Social History  Substance Use Topics  . Smoking status: Current Every Day Smoker    Packs/day: 0.50    Years: 30.00    Types: Cigarettes  . Smokeless tobacco: Current User    Types: Chew     Comment: vapor cig. 4-5 ciggs per day  . Alcohol use 0.0 oz/week     Comment: Socially; Daily tomato juice and beer (one only)     Allergies   Patient  has no known allergies.   Review of Systems Review of Systems  Ten systems reviewed and are negative for acute change, except as noted in the HPI.   Physical Exam Updated Vital Signs BP 132/83   Pulse 92   Temp 98.1 F (36.7 C) (Oral)   Resp 18   Ht 5\' 7"  (1.702 m)   Wt 71.7 kg (158 lb)   SpO2 95%   BMI 24.75 kg/m   Physical Exam  Constitutional: He appears well-developed and well-nourished. No distress.  HENT:  Head: Normocephalic and atraumatic.  Eyes: Conjunctivae are normal. No scleral icterus.  Neck: Normal range of motion. Neck supple.  Cardiovascular: Normal rate, regular rhythm and normal heart sounds.   Pulmonary/Chest: Effort normal and breath sounds normal. No respiratory distress.  Abdominal: Soft. There is no tenderness.  Musculoskeletal: He exhibits  no edema.  No midline spinal tenderness, left lumbar paraspinal tenderness, palpable spasm, which reproduces the patient's pain. Range of motion is limited due to pain. Normal strength. Bilateral lower extremities. Normal DTRs, palpable popliteal pulses bilaterally  Neurological: He is alert.  Skin: Skin is warm and dry. He is not diaphoretic.  Psychiatric: His behavior is normal.  Nursing note and vitals reviewed.    ED Treatments / Results  Labs (all labs ordered are listed, but only abnormal results are displayed) Labs Reviewed  BASIC METABOLIC PANEL - Abnormal; Notable for the following:       Result Value   Glucose, Bld 104 (*)    All other components within normal limits  I-STAT CHEM 8, ED - Abnormal; Notable for the following:    BUN 22 (*)    Glucose, Bld 136 (*)    Calcium, Ion 1.11 (*)    All other components within normal limits  CBC    EKG  EKG Interpretation None       Radiology Ct Angio Chest/abd/pel For Dissection W And/or Wo Contrast  Addendum Date: 08/05/2016   ADDENDUM REPORT: 08/05/2016 22:05 ADDENDUM: The infrarenal aortic focal dissection in question can be discerned on a  prior CT abdomen study from 07/24/2009 and appears to be of similar size given slight differential opacification due to timing of contrast. 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 Electronically Signed   By: Ashley Royalty M.D.   On: 08/05/2016 22:05   Result Date: 08/05/2016 CLINICAL DATA:  Woke up with left lower back pain today. Question of aortic aneurysm seen on bedside ultrasound. EXAM: CT ANGIOGRAPHY CHEST, ABDOMEN AND PELVIS TECHNIQUE: Multidetector CT imaging through the chest, abdomen and pelvis was performed using the standard protocol during bolus administration of intravenous contrast. Multiplanar reconstructed images and MIPs were obtained and reviewed to evaluate the vascular anatomy. CONTRAST:  100 cc Isovue 370 IV COMPARISON:  None. FINDINGS: CTA CHEST FINDINGS Cardiovascular: Three-vessel coronary arteriosclerosis. Normal sized cardiac chambers without pericardial effusion. Aortic atherosclerosis without thoracic aortic aneurysm. Mild ectasia of the ascending aorta up to 3.8 cm. Mediastinum/Nodes: No enlarged mediastinal, hilar, or axillary lymph nodes. Thyroid gland, trachea, and esophagus demonstrate no significant findings. Lungs/Pleura: Subpleural atelectasis and/or scarring the lingula. No dominant mass, pneumonic consolidation, effusion or pneumothorax. Musculoskeletal: No chest wall abnormality. No acute or significant osseous findings. Review of the MIP images confirms the above findings. CTA ABDOMEN AND PELVIS FINDINGS VASCULAR Aorta: Normal caliber aorta with soft and hard plaque is noted within. Small infrarenal right-sided focal dissection is noted, series 5, image 132 through 139 and coronal image 76 spanning 2.2 cm in length. No evidence of leak nor involvement of branch vessels. Celiac: Patent without evidence of aneurysm, dissection, vasculitis or significant stenosis. The left gastric appears to takeoff separately from the aorta. SMA: Mild-to-moderate soft  plaque noted at the origin of the SMA without thrombosis or occlusion. Renals: Single renal arteries are noted to both kidneys without stenosis. No evidence of aneurysm, dissection or vasculitis. No evidence of fibromuscular dysplasia. IMA: Patent without evidence of aneurysm, dissection, vasculitis or significant stenosis. 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 Mild-to-moderate soft and hard plaque within both common iliac arteries. No evidence of dissection, vasculitis or significant stenosis. Veins: No obvious venous abnormality within the limitations of this arterial phase study. Review of the MIP images confirms the above findings. NON-VASCULAR Hepatobiliary: Status post cholecystectomy.  No space-occupying mass of the liver biliary dilatation. Pancreas: Normal Spleen: No splenomegaly. Inhomogeneous enhancement consistent with timing of contrast bolus. Adrenals/Urinary Tract: Bilateral adrenal thickening left-greater-than-right consistent with hyperplasia. Water attenuating cysts in the right kidney in the upper pole measuring 2 cm with a left lower pole too small to characterize 0.7 cm hypodensity more likely to represent a small cyst as well. No solid enhancing renal lesions or nephrolithiasis. No obstructive uropathy. The urinary bladder is unremarkable. Stomach/Bowel: Scattered colonic diverticulosis without acute diverticulitis. Increased colonic stool burden within large bowel. Normal small bowel rotation. Nondistended stomach. Status post appendectomy. Lymphatic: No lymphadenopathy. Reproductive: Prostate is unremarkable. Other: No abdominal wall hernia or abnormality. No abdominopelvic ascites. Musculoskeletal: Mild L1-2 disc space narrowing with anterior osteophytes. No acute nor suspicious osseous lesions. Review of the MIP images confirms the above findings. IMPRESSION: 1. Small focal infrarenal abdominal aortic  dissection spanning 2 cm length without involvement of adjacent branch vessels. Diffuse atherosclerotic appearance of the thoracic and abdominal aorta. 2. 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 3. Mild-to-moderate soft plaque in the origin of the SMA without occlusion or thrombosis. 4. Status post cholecystectomy. 5. Water attenuating cysts of both kidneys. 6. Three-vessel coronary arteriosclerosis. Minimal lingular scarring and atelectasis. Electronically Signed: By: Ashley Royalty M.D. On: 08/05/2016 21:17    Procedures Procedures (including critical care time)  Medications Ordered in ED Medications  morphine 4 MG/ML injection 4 mg (4 mg Intravenous Given 08/05/16 2033)  iopamidol (ISOVUE-370) 76 % injection 100 mL (100 mLs Intravenous Contrast Given 08/05/16 2040)  morphine 4 MG/ML injection 4 mg (4 mg Intravenous Given 08/05/16 2153)     Initial Impression / Assessment and Plan / ED Course  I have reviewed the triage vital signs and the nursing notes.  Pertinent labs & imaging results that were available during my care of the patient were reviewed by me and considered in my medical decision making (see chart for details).  Clinical Course as of Aug 07 135  Mon Aug 05, 2016  1819 No CS on NCCSRS   [AH]  1935 Bedside ultrasound performed by Dr. Lita Mains. Although there is a large amount of bowel gas. There is a concern for potential enlarged abdominal aorta. The patient will receive ACT [place] for assessment. Although I do not feel that this is the cause of his pain today as it does appear to be musculoskeletal and reproducible. The patient does have significant risk factors. I have explained our medical decision-making the patient who understands and agrees with further workup at this time.  [AH]  2138 Patient noted to have a 2 cm infrarenal aortic dissection on CT angiogram. I have placed a call to the vascular surgeon. He  is stable throughout his visit. Patient in the fast track area will now be moved to a higher acuity and placed on monitoring.  [AH]  2152 I spoke with Dr. Verita Schneiders, who would like to see the patient for evaluation in the emergency department. At [facility]. The patient has been stable throughout his visit here in the emergency department. I have reduced the patient's pain medication. I have discussed the findings and plan of care. Patient agrees with the plan of care. He'll be transferred: At this time. Dr. Howell Rucks excepting physician.  [AH]  2156 Spoke with Dr. early again. He will review the images that looked the same and is going to discuss the case with the  radiologist. He will call me back to update the plan of care  [AH]    Clinical Course User Index [AH] Margarita Mail, PA-C    Upon further review of the images. Patient had the same location in his aorta back in 2011 and has not had any changes in the past 7 years. Dr. Randel Pigg has posted an addendum to his additional repeat. The transfer has been canceled. Dr. early. We'll see the patient in his office in follow-up. Patient be treated for low back pain. He is in agreement with this plan of care. He appears safe for discharge at this time.  Final Clinical Impressions(s) / ED Diagnoses   Final diagnoses:  Acute left-sided low back pain without sciatica  Abdominal aortic atherosclerosis ([place])  Smoking    New Prescriptions Discharge Medication List as of 08/05/2016 10:16 PM    START taking these medications   Details  baclofen (LIORESAL) 10 MG tablet Take 1 tablet (10 mg total) by mouth 3 (three) times daily., Starting Mon 08/05/2016, Print    meloxicam (MOBIC) 15 MG tablet Take 1 tablet (15 mg total) by mouth daily. Take 1 daily with food., Starting Mon 08/05/2016, Print    oxyCODONE-acetaminophen (PERCOCET) 5-325 MG tablet Take 1-2 tablets by mouth every 4 (four) hours as needed., Starting Mon 08/05/2016, Print         [place],  [place], PA-C 08/06/16 1898    Julianne Rice, MD 08/08/16 1504

## 2016-08-05 NOTE — Discharge Instructions (Signed)
SEEK IMMEDIATE MEDICAL ATTENTION IF: New numbness, tingling, weakness, or problem with the use of your arms or legs.  Severe back pain not relieved with medications.  Change in bowel or bladder control.  Increasing pain in any areas of the body (such as chest or abdominal pain).  Shortness of breath, dizziness or fainting.  Nausea (feeling sick to your stomach), vomiting, fever, or sweats.  Get help right away if: You have symptoms of a heart attack, such as: Chest pain. Shortness of breath. Pain in your neck, jaw, arms, back, or stomach. Cold sweat. Nausea. Light-headedness. You have symptoms of a stroke, such as sudden: Weakness on one side of your body. Confusion. Changes in vision. Inability to speak or understand speech. Loss of balance, coordination, or ability to walk. Severe headache. Loss of consciousness.

## 2016-08-05 NOTE — ED Triage Notes (Signed)
Pt comes in with lower back pain starting today after walking. Denies any injury. Pt denies any urinary problems. Denies any n/v/d.

## 2016-08-06 ENCOUNTER — Telehealth: Payer: Self-pay | Admitting: Vascular Surgery

## 2016-08-06 NOTE — Telephone Encounter (Signed)
Sched appt 08/20/16 at 11:45. Ph#'s would not work for pt or emergency contact. Sent message through MyChart for pt to call back to confirm appt.

## 2016-08-06 NOTE — Telephone Encounter (Signed)
-----   Message from Denman George, RN sent at 08/06/2016 11:26 AM EDT ----- Regarding: needs appt. with Dr. Donnetta Hutching within next 1-2 weeks Per Dr. Donnetta Hutching- sched. OV with him within next 1-2 weeks; seen in ER; had CTA chest/ abd./ pelvis.  Has chronic infrarenal aortic focal dissection.  No studies needed per TFE.

## 2016-08-06 NOTE — Telephone Encounter (Signed)
I received a call on Monday evening June 4 from [facility] emergency department. The patient had presented with back pain. Underwent a CT scan and there was concern for aortic dissection. I was consult at and reviewed the films remotely. It was my impression that this was chronic atherosclerotic change rather than an acute dissection. I did initially instructed the patient be transferred to [facility] hospital for further evaluation. I then reviewed the patient's prior abdominal CT scan for other causes from 2011. The area of concern appeared exactly the same in 2011 as it does currently. I reviewed the films with the interpreting radiologist and he agreed that there was no change. I relayed this to the emergency room provider that he does not need to be transferred to [facility]. I'll will arrange follow-up to discuss this studies with the patient as an outpatient in the next week or 2. Also does have some mild ectasia of his iliac artery which will be discussed as well

## 2016-08-12 ENCOUNTER — Encounter: Payer: Self-pay | Admitting: Vascular Surgery

## 2016-08-20 ENCOUNTER — Ambulatory Visit (INDEPENDENT_AMBULATORY_CARE_PROVIDER_SITE_OTHER): Payer: Self-pay | Admitting: Vascular Surgery

## 2016-08-20 ENCOUNTER — Encounter: Payer: Self-pay | Admitting: Vascular Surgery

## 2016-08-20 VITALS — BP 130/88 | HR 99 | Temp 99.1°F | Resp 16 | Ht 67.0 in | Wt 160.4 lb

## 2016-08-20 DIAGNOSIS — I7772 Dissection of iliac artery: Secondary | ICD-10-CM

## 2016-08-20 DIAGNOSIS — I739 Peripheral vascular disease, unspecified: Secondary | ICD-10-CM

## 2016-08-20 NOTE — Progress Notes (Signed)
Vascular and Vein Specialist of [place]  Patient name: Jimmy Duran MRN: [id number] DOB: 1955-10-19 Sex: male  REASON FOR CONSULT: Discuss recent outpatient CT scan showing aortoiliac peripheral vascular disease.  HPI: Jimmy Duran is a 61 y.o. male, who is here today for discussion of recent CT scan. He presented to the [facility] emergency department approximately 2 weeks ago. He was complaining of back pain and underwent CT scan of his chest abdomen and pelvis to rule out dissection. His back pain appears to be classic for degenerative disc disease. It is positional and extends down into both lower extremities. At the time of his CT scan he was found to have what was possibly a dissection of his infrarenal aorta and also some ectasia of his left common iliac artery. I was called by the emergency room physician at 67.. I reviewed his CT scan remotely and felt that this had the very low likelihood of being asymptomatic infrarenal dissection. It did appear to be atherosclerotic in nature. Nonetheless I initially directed that the patient be transferred to [facility] hospital. On further review I was able to visualize a CT scan from 2011. That CT scan scan showed that the area of concern was exactly the same as it is on the current scan 7 years later. I discussed this with the reading radiologist who agreed and then with the emergency room physician and explained that he did not need to be transferred to our hospital. He is seen today for discussion. The patient reports that his back pain is chronic and, and not changed. Prior history of cardiac disease. Does have a long history of cigarette smoking. No history of claudication or lower extremity ischemia.  Past Medical History:  Diagnosis Date  . Arthritis   . Carotid artery disease ([place])   . Colon polyp   . COPD (chronic obstructive pulmonary disease) ([place])   . Coronary artery disease    Nonobstructive  .  Diabetes mellitus   . Dyspnea   . Dysrhythmia   . Essential hypertension, benign   . GERD (gastroesophageal reflux disease)   . Hemorrhoids   . History of orthopnea   . Hypertension   . Mixed hyperlipidemia   . Stroke [facility])    "ministrokes"  . Type 2 diabetes mellitus (HCC)     Family History  Problem Relation Age of Onset  . Colon polyps Mother   . Coronary artery disease Father        CABG in his 5's  . Colon cancer Other        maternal great aunt  . Colon cancer Other        paternal great aunt    SOCIAL HISTORY: Social History   Social History  . Marital status: Married    Spouse name: N/A  . Number of children: N/A  . Years of education: N/A   Occupational History  . [employer] shop [employer]   Social History Main Topics  . Smoking status: Current Every Day Smoker    Packs/day: 0.50    Years: 30.00    Types: Cigarettes  . Smokeless tobacco: Current User    Types: Chew     Comment: 1 pk per day.   . Alcohol use 0.0 oz/week     Comment: Socially; Daily tomato juice and beer (one only)  . Drug use: No  . Sexual activity: Yes    Birth control/ protection: None   Other Topics Concern  . Not on  file   Social History Narrative  . No narrative on file    No Known Allergies  Current Outpatient Prescriptions  Medication Sig Dispense Refill  . aspirin 325 MG tablet Take 1 tablet (325 mg total) by mouth daily. 30 tablet 1  . Aspirin-Salicylamide-Caffeine (BC HEADACHE POWDER PO) Take 1 Package by mouth daily as needed (headache).    . baclofen (LIORESAL) 10 MG tablet Take 1 tablet (10 mg total) by mouth 3 (three) times daily. 30 each 0  . cetirizine (ZYRTEC) 10 MG tablet Take 10 mg by mouth daily.    Marland Kitchen lisinopril-hydrochlorothiazide (PRINZIDE,ZESTORETIC) 20-12.5 MG tablet Take 1 tablet by mouth every morning. 90 tablet 3  . ranitidine (ZANTAC) 75 MG tablet Take 75 mg by mouth daily.    . meloxicam (MOBIC) 15 MG tablet Take 1 tablet (15 mg total) by  mouth daily. Take 1 daily with food. (Patient not taking: Reported on 08/20/2016) 10 tablet 0  . metFORMIN (GLUCOPHAGE) 500 MG tablet Take 1 tablet (500 mg total) by mouth 2 (two) times daily with a meal. (Patient not taking: Reported on 08/20/2016) 90 tablet 3   No current facility-administered medications for this visit.     REVIEW OF SYSTEMS:  [X]  denotes positive finding, [ ]  denotes negative finding Cardiac  Comments:  Chest pain or chest pressure: x   Shortness of breath upon exertion: x   Short of breath when lying flat: x   Irregular heart rhythm: x       Vascular    Pain in calf, thigh, or hip brought on by ambulation: x Neurogenic   Pain in feet at night that wakes you up from your sleep:  x   Blood clot in your veins:    Leg swelling:         Pulmonary    Oxygen at home:    Productive cough:  x   Wheezing:         Neurologic    Sudden weakness in arms or legs:  x   Sudden numbness in arms or legs:     Sudden onset of difficulty speaking or slurred speech:    Temporary loss of vision in one eye:     Problems with dizziness:  x       Gastrointestinal    Blood in stool:     Vomited blood:         Genitourinary    Burning when urinating:     Blood in urine:        Psychiatric    Major depression:         Hematologic    Bleeding problems:    Problems with blood clotting too easily:        Skin    Rashes or ulcers:        Constitutional    Fever or chills:      PHYSICAL EXAM: Vitals:   08/20/16 1152 08/20/16 1156  BP: (!) 141/97 130/88  Pulse: 99   Resp: 16   Temp: 99.1 F (37.3 C)   TempSrc: Oral   SpO2: 96%   Weight: 160 lb 6.4 oz (72.8 kg)   Height: 5\' 7"  (1.702 m)     GENERAL: The patient is a well-nourished male, in no acute distress. The vital signs are documented above. CARDIOVASCULAR: 2+ radial and 2+ dorsalis pedis pulses bilaterally. Carotid arteries without bruits. Heart regular rate and rhythm without murmur. PULMONARY: There is  good air exchange  ABDOMEN: Soft  and non-tender . No aneurysm palpable MUSCULOSKELETAL: There are no major deformities or cyanosis. NEUROLOGIC: No focal weakness or paresthesias are detected. SKIN: There are no ulcers or rashes noted. PSYCHIATRIC: The patient has a normal affect.  DATA:  CT scan reviewed and discussed with the patient and his wife.  MEDICAL ISSUES: Asymptomatic irregular plaque in the infrarenal aorta and ectasia of his left common iliac artery up to 1.9 cm. Have recommended that we see him again in one year with ultrasound to follow his iliac ectasia. Would then drop back to every 2 years. Patient understands and will see [name] that follow-up   Rosetta Posner, MD [facility] Vascular and Vein Specialists of [facility] Tel [phone] Pager [phone]

## 2016-08-22 DIAGNOSIS — Z0279 Encounter for issue of other medical certificate: Secondary | ICD-10-CM | POA: Diagnosis not present

## 2016-08-27 ENCOUNTER — Encounter: Payer: Self-pay | Admitting: Vascular Surgery

## 2016-08-27 NOTE — Addendum Note (Signed)
Addended by: Lianne Cure A on: 08/27/2016 12:14 PM   Modules accepted: Orders

## 2017-01-30 ENCOUNTER — Encounter (HOSPITAL_COMMUNITY): Payer: Self-pay | Admitting: Emergency Medicine

## 2017-01-30 ENCOUNTER — Other Ambulatory Visit: Payer: Self-pay

## 2017-01-30 ENCOUNTER — Emergency Department (HOSPITAL_COMMUNITY)
Admission: EM | Admit: 2017-01-30 | Discharge: 2017-01-30 | Disposition: A | Discharge status: Home / Self Care | Payer: PRIVATE HEALTH INSURANCE | Attending: Emergency Medicine | Admitting: Emergency Medicine

## 2017-01-30 DIAGNOSIS — F17210 Nicotine dependence, cigarettes, uncomplicated: Secondary | ICD-10-CM | POA: Insufficient documentation

## 2017-01-30 DIAGNOSIS — Z7982 Long term (current) use of aspirin: Secondary | ICD-10-CM | POA: Insufficient documentation

## 2017-01-30 DIAGNOSIS — G8929 Other chronic pain: Secondary | ICD-10-CM | POA: Insufficient documentation

## 2017-01-30 DIAGNOSIS — M545 Low back pain, unspecified: Secondary | ICD-10-CM

## 2017-01-30 DIAGNOSIS — I10 Essential (primary) hypertension: Secondary | ICD-10-CM | POA: Insufficient documentation

## 2017-01-30 DIAGNOSIS — Z96652 Presence of left artificial knee joint: Secondary | ICD-10-CM | POA: Insufficient documentation

## 2017-01-30 DIAGNOSIS — I2510 Atherosclerotic heart disease of native coronary artery without angina pectoris: Secondary | ICD-10-CM | POA: Insufficient documentation

## 2017-01-30 DIAGNOSIS — J449 Chronic obstructive pulmonary disease, unspecified: Secondary | ICD-10-CM | POA: Insufficient documentation

## 2017-01-30 DIAGNOSIS — E119 Type 2 diabetes mellitus without complications: Secondary | ICD-10-CM | POA: Insufficient documentation

## 2017-01-30 DIAGNOSIS — R739 Hyperglycemia, unspecified: Secondary | ICD-10-CM | POA: Insufficient documentation

## 2017-01-30 DIAGNOSIS — Z7984 Long term (current) use of oral hypoglycemic drugs: Secondary | ICD-10-CM | POA: Insufficient documentation

## 2017-01-30 HISTORY — DX: Low back pain, unspecified: M54.50

## 2017-01-30 HISTORY — DX: Low back pain: M54.5

## 2017-01-30 HISTORY — DX: Radiculopathy, lumbar region: M54.16

## 2017-01-30 LAB — BASIC METABOLIC PANEL
Anion gap: 11 (ref 5–15)
BUN: 24 mg/dL — ABNORMAL HIGH (ref 6–20)
CO2: 26 mmol/L (ref 22–32)
Calcium: 9.2 mg/dL (ref 8.9–10.3)
Chloride: 97 mmol/L — ABNORMAL LOW (ref 101–111)
Creatinine, Ser: 0.99 mg/dL (ref 0.61–1.24)
GFR calc Af Amer: >60 mL/min (ref >60)
GFR calc non Af Amer: >60 mL/min (ref >60)
Glucose, Bld: 394 mg/dL — ABNORMAL HIGH (ref 65–99)
Potassium: 3.9 mmol/L (ref 3.5–5.1)
Sodium: 134 mmol/L — ABNORMAL LOW (ref 135–145)

## 2017-01-30 LAB — URINALYSIS, ROUTINE W REFLEX MICROSCOPIC
Bacteria, UA: NONE SEEN
Bilirubin Urine: NEGATIVE
Glucose, UA: >=500 mg/dL — AB
Ketones, ur: NEGATIVE mg/dL
Leukocytes, UA: NEGATIVE
Nitrite: NEGATIVE
Protein, ur: NEGATIVE mg/dL
Specific Gravity, Urine: 1.024 (ref 1.005–1.030)
Squamous Epithelial / LPF: NONE SEEN
pH: 5 (ref 5.0–8.0)

## 2017-01-30 LAB — CBC WITH DIFFERENTIAL/PLATELET
Basophils Absolute: 0.1 10*3/uL (ref 0.0–0.1)
Basophils Relative: 1 %
Eosinophils Absolute: 0.3 10*3/uL (ref 0.0–0.7)
Eosinophils Relative: 4 %
HCT: 42.7 % (ref 39.0–52.0)
Hemoglobin: 14.7 g/dL (ref 13.0–17.0)
Lymphocytes Relative: 24 %
Lymphs Abs: 1.8 10*3/uL (ref 0.7–4.0)
MCH: 31.5 pg (ref 26.0–34.0)
MCHC: 34.4 g/dL (ref 30.0–36.0)
MCV: 91.4 fL (ref 78.0–100.0)
Monocytes Absolute: 0.5 10*3/uL (ref 0.1–1.0)
Monocytes Relative: 7 %
Neutro Abs: 4.8 10*3/uL (ref 1.7–7.7)
Neutrophils Relative %: 64 %
Platelets: 276 10*3/uL (ref 150–400)
RBC: 4.67 MIL/uL (ref 4.22–5.81)
RDW: 12.8 % (ref 11.5–15.5)
WBC: 7.6 10*3/uL (ref 4.0–10.5)

## 2017-01-30 MED ORDER — NAPROXEN 375 MG PO TABS: 375.0000 mg | ORAL_TABLET | Freq: Two times a day (BID) | ORAL | 0 refills | Status: DC

## 2017-01-30 MED ORDER — HYDROCODONE-ACETAMINOPHEN 5-325 MG PO TABS: 1.0000 | ORAL_TABLET | ORAL | 0 refills | Status: DC | PRN

## 2017-01-30 MED ORDER — HYDROCODONE-ACETAMINOPHEN 5-325 MG PO TABS
1.0000 | ORAL_TABLET | Freq: Once | ORAL | Status: AC
  Administered 2017-01-30: 1 via ORAL
  Filled 2017-01-30: qty 1

## 2017-01-30 NOTE — ED Triage Notes (Signed)
Pain to low lt back since Monday

## 2017-01-30 NOTE — Discharge Instructions (Signed)
T Do not drive within 4 hours of taking hydrocodone as this medication will make you drowsy.  Avoid lifting,  Bending,  Twisting or any other activity that worsens your pain over the next week.  Apply a heating pad to your back for 20 minutes 3-4 times daily.    You should get rechecked if your symptoms are not better over the next 5 days,  Or you develop increased pain,  Weakness in your leg(s) or loss of bladder or bowel function - these are potential symptoms of a worsening condition.  Take your diabetes medication as soon as you are home and make sure you are keeping a close watch on your blood glucose levels.  You need an appointment with your doctor to determine if your diabetes would be better controlled with a medication adjustment.

## 2017-01-31 NOTE — ED Provider Notes (Signed)
[facility] EMERGENCY DEPARTMENT Provider Note   CSN: [id number] Arrival date & time: 01/30/17  1654     History   Chief Complaint Chief Complaint  Patient presents with  . Back Pain    HPI Jimmy Duran is a 61 y.o. male with a history of arthritis, CAD, diabetes hypertension and a known stable 2 cm infrarenal aortic aneurysm which was last assessed June  2018 and unchanged since prior study in 2011 along with chronic low back pain with radiculopathy presenting with a one-week history of low back pain.  His pain is worsened with movement, certain positions and palpation of his left mid to lower back.  He denies weakness or numbness in his lower extremities, denies urinary or fecal incontinence or retention.  Patient has taken anti-inflammatories, Tylenol, and is also used heat and rest without improvement in his pain.  The history is provided by the patient and the spouse.    Past Medical History:  Diagnosis Date  . Arthritis   . Carotid artery disease ([place])   . Colon polyp   . COPD (chronic obstructive pulmonary disease) ([place])   . Coronary artery disease    Nonobstructive  . Diabetes mellitus   . Dyspnea   . Dysrhythmia   . Essential hypertension, benign   . GERD (gastroesophageal reflux disease)   . Hemorrhoids   . History of orthopnea   . Hypertension   . Low back pain   . Lumbar radiculopathy   . Mixed hyperlipidemia   . Stroke [facility])    "ministrokes"  . Type 2 diabetes mellitus [facility])     Patient Active Problem List   Diagnosis Date Noted  . Diarrhea 08/15/2014  . Dizziness 01/04/2014  . Transient ischemic attack 01/04/2014  . Palpitations 04/22/2013  . Esophageal dysphagia 07/02/2012  . OA (osteoarthritis) of knee 06/17/2011  . GERD (gastroesophageal reflux disease) 05/04/2010  . Diabetes ([place]) 05/04/2010  . COPD (chronic obstructive pulmonary disease) ([place]) 05/04/2010  . Hemorrhoids 05/04/2010  . Mixed hyperlipidemia 04/25/2010  . Tobacco use  disorder 04/25/2010  . Essential hypertension, benign 04/25/2010  . CORONARY ATHEROSCLEROSIS NATIVE CORONARY ARTERY 04/25/2010  . CAROTID ARTERY DISEASE 04/25/2010    Past Surgical History:  Procedure Laterality Date  . APPENDECTOMY  1970  . BREAST SURGERY     Cyst resection on the right  . CATARACT EXTRACTION W/PHACO Right 06/18/2016   Procedure: CATARACT EXTRACTION PHACO AND INTRAOCULAR LENS PLACEMENT (IOC);  Surgeon: [name], MD;  Location: [facility] ORS;  Service: Ophthalmology;  Laterality: Right;  [name] 35.7 AP% 16.6 CDE 5.94 Fluid pack lot # [id number]  . CATARACT EXTRACTION W/PHACO Left 07/16/2016   Procedure: CATARACT EXTRACTION PHACO AND INTRAOCULAR LENS PLACEMENT (IOC);  Surgeon: [name], MD;  Location: [facility] ORS;  Service: Ophthalmology;  Laterality: Left;  [name] 51.8 AP% 12.7 CDE 6.58 Fluid Pack Lot # [id number]  . CHOLECYSTECTOMY  1993  . COLONOSCOPY  02/12/2007   LEX:[id number] friable anal canal hemorrhoids, otherwise normal rectum and colon  . COLONOSCOPY N/A 07/16/2012   RMR: Colonic diverticulosis. colinic polyp-removed as described above.   . ESOPHAGOGASTRODUODENOSCOPY (EGD) WITH ESOPHAGEAL DILATION N/A 07/16/2012   RMR: Abnormal esophagus suspicious for Candida-status pos KOH brushing. Erosive reflux esophagiits. Schatizi's ring day status post biopsy. Hiatal hernia. Antral erosions status post biopsy.   Marland Kitchen HERNIA REPAIR    . JOINT REPLACEMENT    . KNEE SURGERY     Left knee arthroscopy torn medial meniscus grade 4 chondral changes medial femoral condyle  tibial plateau  . TOTAL KNEE ARTHROPLASTY Left 04/27/2012   Procedure: TOTAL KNEE ARTHROPLASTY;  Surgeon: Carole Civil, MD;  Location: [facility]S;  Service: Orthopedics;  Laterality: Left;  . [place] Medications    Prior to Admission medications   Medication Sig Start Date End Date Taking? Authorizing Provider  aspirin 325 MG tablet Take 1 tablet (325 mg total) by mouth  daily. 01/05/14  Yes Kathie Dike, MD  Aspirin-Salicylamide-Caffeine (BC HEADACHE POWDER PO) Take 1 Package by mouth daily as needed (headache).   Yes [provider]  carvedilol (COREG) 3.125 MG tablet Take 3.125 mg by mouth. 08/31/16  Yes [provider]  cetirizine (ZYRTEC) 10 MG tablet Take 10 mg by mouth daily.   Yes [provider]  JANUMET 50-1000 MG tablet Take 1 tablet by mouth 2 (two) times daily. 12/11/16  Yes [provider]  lovastatin (MEVACOR) 40 MG tablet Take 40 mg by mouth daily.  08/31/16  Yes [provider]  PROVENTIL HFA 108 (90 Base) MCG/ACT inhaler Inhale 2 PUFFS BY MOUTH 4 TIMES DAILY 10/28/16  Yes [provider]  ranitidine (ZANTAC) 75 MG tablet Take 75 mg by mouth daily.   Yes [provider]  rosuvastatin (CRESTOR) 40 MG tablet Take 40 mg by mouth at bedtime. 12/16/16  Yes [provider]  baclofen (LIORESAL) 10 MG tablet Take 1 tablet (10 mg total) by mouth 3 (three) times daily. Patient not taking: Reported on 01/30/2017 08/05/16   Margarita Mail, PA-C  HYDROcodone-acetaminophen (NORCO/VICODIN) 5-325 MG tablet Take 1 tablet by mouth every 4 (four) hours as needed. 01/30/17   Evalee Jefferson, PA-C  meloxicam (MOBIC) 15 MG tablet Take 1 tablet (15 mg total) by mouth daily. Take 1 daily with food. Patient not taking: Reported on 08/20/2016 08/05/16   Margarita Mail, PA-C  naproxen (NAPROSYN) 375 MG tablet Take 1 tablet (375 mg total) by mouth 2 (two) times daily. 01/30/17   Evalee Jefferson, PA-C    Family History Family History  Problem Relation Age of Onset  . Colon polyps Mother   . Coronary artery disease Father        CABG in his 45's  . Colon cancer Other        maternal great aunt  . Colon cancer Other        paternal great aunt    Social History Social History   Tobacco Use  . Smoking status: Current Every Day Smoker    Packs/day: 0.50    Years: 30.00    Pack years: 15.00    Types:  Cigarettes  . Smokeless tobacco: Current User    Types: Chew  . Tobacco comment: 1 pk per day.   Substance Use Topics  . Alcohol use: Yes    Alcohol/week: 0.0 oz    Comment: Socially; Daily tomato juice and beer (one only)  . Drug use: No     Allergies   Patient has no known allergies.   Review of Systems Review of Systems  Constitutional: Negative for fever.  Respiratory: Negative for shortness of breath.   Cardiovascular: Negative for chest pain and leg swelling.  Gastrointestinal: Negative for abdominal distention, abdominal pain and constipation.  Genitourinary: Negative for difficulty urinating, dysuria, flank pain, frequency and urgency.  Musculoskeletal: Positive for back pain. Negative for gait problem and joint swelling.  Skin: Negative for rash.  Neurological: Negative for weakness and numbness.  Physical Exam Updated Vital Signs BP 130/77 (BP Location: Right Arm)   Pulse (!) 104   Temp 98.7 F (37.1 C) (Oral)   Resp 19   Wt 72.6 kg (160 lb)   SpO2 98%   BMI 25.06 kg/m   Physical Exam  Constitutional: He appears well-developed and well-nourished.  HENT:  Head: Normocephalic.  Eyes: Conjunctivae are normal.  Neck: Normal range of motion. Neck supple.  Cardiovascular: Normal rate and intact distal pulses.  Pedal pulses normal.  Pulmonary/Chest: Effort normal.  Abdominal: Soft. Bowel sounds are normal. He exhibits no distension and no mass. There is no tenderness. There is no guarding.  Musculoskeletal: Normal range of motion. He exhibits no edema.       Lumbar back: He exhibits tenderness. He exhibits no swelling, no edema and no spasm.  Neurological: He is alert. He has normal strength. He displays no atrophy and no tremor. No sensory deficit. Gait normal.  Reflex Scores:      Patellar reflexes are 2+ on the right side and 2+ on the left side.      Achilles reflexes are 2+ on the right side and 2+ on the left side. No strength deficit noted in hip  and knee flexor and extensor muscle groups.  Ankle flexion and extension intact.  Skin: Skin is warm and dry.  Psychiatric: He has a normal mood and affect.  Nursing note and vitals reviewed.    ED Treatments / Results  Labs (all labs ordered are listed, but only abnormal results are displayed) Labs Reviewed  URINALYSIS, ROUTINE W REFLEX MICROSCOPIC - Abnormal; Notable for the following components:      Result Value   Glucose, UA >=500 (*)    Hgb urine dipstick SMALL (*)    All other components within normal limits  BASIC METABOLIC PANEL - Abnormal; Notable for the following components:   Sodium 134 (*)    Chloride 97 (*)    Glucose, Bld 394 (*)    BUN 24 (*)    All other components within normal limits  CBC WITH DIFFERENTIAL/PLATELET    EKG  EKG Interpretation None       Radiology No results found.  Procedures Procedures (including critical care time)  Medications Ordered in ED Medications  HYDROcodone-acetaminophen (NORCO/VICODIN) 5-325 MG per tablet 1 tablet (1 tablet Oral Given 01/30/17 1815)     Initial Impression / Assessment and Plan / ED Course  I have reviewed the triage vital signs and the nursing notes.  Pertinent labs & imaging results that were available during my care of the patient were reviewed by me and considered in my medical decision making (see chart for details).     Labs reviewed, patient with hyperglycemia.  This was discussed, he states he has been having fairly significant fluctuations in his CBGs, with current values not inconsistent with blood sugars when he checks at home.  He has no acidosis today and no symptoms suggesting DKA.  He was encouraged to follow-up with his PCP within the next week for recheck of his blood glucose and for consideration of medication adjustment if blood sugars remain elevated.  He was placed on hydrocodone for pain, Naprosyn, continued heat therapy, activity as tolerated.  Abdomen is soft and nontender, pain  is reproducible and most consistent with his chronic low back pain.  He has no abdominal pain or distention.  CT imaging from June revealing for a stable aneurysm which is unchanged in the past 7 years, doubt this  chronic condition has any bearing on today's symptoms.  Final Clinical Impressions(s) / ED Diagnoses   Final diagnoses:  Chronic left-sided low back pain without sciatica  Hyperglycemia    ED Discharge Orders        Ordered    HYDROcodone-acetaminophen (NORCO/VICODIN) 5-325 MG tablet  Every 4 hours PRN     01/30/17 2029    naproxen (NAPROSYN) 375 MG tablet  2 times daily     01/30/17 2029       Evalee Jefferson, PA-C 02/02/17 Raelyn Ensign, MD 02/11/17 [phone]

## 2017-06-04 ENCOUNTER — Encounter: Payer: Self-pay | Admitting: Internal Medicine

## 2017-08-08 ENCOUNTER — Ambulatory Visit: Payer: Self-pay | Admitting: Gastroenterology

## 2017-08-21 ENCOUNTER — Ambulatory Visit (INDEPENDENT_AMBULATORY_CARE_PROVIDER_SITE_OTHER): Payer: Self-pay | Admitting: Family

## 2017-08-21 ENCOUNTER — Ambulatory Visit (HOSPITAL_COMMUNITY)
Admission: RE | Admit: 2017-08-21 | Discharge: 2017-08-21 | Disposition: A | Discharge status: Home / Self Care | Payer: Medicaid Other | Source: Ambulatory Visit | Attending: Vascular Surgery | Admitting: Vascular Surgery

## 2017-08-21 ENCOUNTER — Encounter (HOSPITAL_COMMUNITY): Payer: Self-pay

## 2017-08-21 ENCOUNTER — Encounter: Payer: Self-pay | Admitting: Family

## 2017-08-21 ENCOUNTER — Other Ambulatory Visit: Payer: Self-pay

## 2017-08-21 VITALS — BP 176/102 | HR 85 | Temp 98.1°F | Resp 18 | Ht 67.0 in | Wt 158.0 lb

## 2017-08-21 DIAGNOSIS — F17200 Nicotine dependence, unspecified, uncomplicated: Secondary | ICD-10-CM

## 2017-08-21 DIAGNOSIS — I739 Peripheral vascular disease, unspecified: Secondary | ICD-10-CM | POA: Diagnosis not present

## 2017-08-21 DIAGNOSIS — I779 Disorder of arteries and arterioles, unspecified: Secondary | ICD-10-CM

## 2017-08-21 DIAGNOSIS — I7772 Dissection of iliac artery: Secondary | ICD-10-CM | POA: Diagnosis not present

## 2017-08-21 NOTE — Progress Notes (Signed)
[facility] [place]   CC: Follow up peripheral artery occlusive disease  History of Present Illness Jimmy Duran is a 62 y.o. male who presented to the [facility] emergency department in early June 2018. He was complaining of back pain and underwent CT scan of his chest abdomen and pelvis to rule out dissection. His back pain appeared to be classic for degenerative disc disease. It was positional and extended down into both lower extremities. At the time of his CT scan he was found to have what was possibly a dissection of his infrarenal aorta and also some ectasia of his left common iliac artery.   Dr. Donnetta Hutching was called by the emergency room physician and reviewed his CT scan remotely and felt that this had the very low likelihood of being asymptomatic infrarenal dissection. It did appear to be atherosclerotic in nature. Nonetheless Dr. Donnetta Hutching initially directed that the patient be transferred to [facility] hospital. On further review Dr. Donnetta Hutching was able to visualize a CT scan from 2011. That CT scan scan showed that the area of concern was exactly the same as it is on the current scan 7 years later. Dr. Donnetta Hutching discussed this with the reading radiologist who agreed and then with the emergency room physician and explained that he did not need to be transferred to our hospital. The patient reported that his back pain was chronic and, and not changed. Prior history of cardiac disease. Does have a long history of cigarette smoking. No history of claudication or lower extremity ischemia.  Dr. Donnetta Hutching last evaluated pt on 08-20-16. At that time asymptomatic irregular plaque in the infrarenal aorta and ectasia of his left common iliac artery up to 1.9 cm by CTA chest/abd/pelvis done 08-05-16. Dr. Donnetta Hutching recommended that we see him again in one year with ultrasound to follow his iliac ectasia. Would then drop back to every 2 years.    Patient left before I could evaluate him.     Diabetic: Yes,  last A1C result on file was 7.8 in 2015   Past Medical History:  Diagnosis Date  . Arthritis   . Carotid artery disease ([place])   . Colon polyp   . COPD (chronic obstructive pulmonary disease) ([place])   . Coronary artery disease    Nonobstructive  . Diabetes mellitus   . Dyspnea   . Dysrhythmia   . Essential hypertension, benign   . GERD (gastroesophageal reflux disease)   . Hemorrhoids   . History of orthopnea   . Hypertension   . Low back pain   . Lumbar radiculopathy   . Mixed hyperlipidemia   . Stroke [facility])    "ministrokes"  . Type 2 diabetes mellitus ([place])     Social History Social History   Tobacco Use  . Smoking status: Current Every Day Smoker    Packs/day: 0.50    Years: 30.00    Pack years: 15.00    Types: Cigarettes  . Smokeless tobacco: Current User    Types: Chew  . Tobacco comment: 1 pk per day.   Substance Use Topics  . Alcohol use: Yes    Alcohol/week: 0.0 oz    Comment: Socially; Daily tomato juice and beer (one only)  . Drug use: No    Family History Family History  Problem Relation Age of Onset  . Colon polyps Mother   . Coronary artery disease Father        CABG in his 68's  . Colon cancer Other  maternal great aunt  . Colon cancer Other        paternal great aunt    Past Surgical History:  Procedure Laterality Date  . APPENDECTOMY  1970  . BREAST SURGERY     Cyst resection on the right  . CATARACT EXTRACTION W/PHACO Right 06/18/2016   Procedure: CATARACT EXTRACTION PHACO AND INTRAOCULAR LENS PLACEMENT (IOC);  Surgeon: [name], MD;  Location: [facility] ORS;  Service: Ophthalmology;  Laterality: Right;  [name] 35.7 AP% 16.6 CDE 5.94 Fluid pack lot # [id number]  . CATARACT EXTRACTION W/PHACO Left 07/16/2016   Procedure: CATARACT EXTRACTION PHACO AND INTRAOCULAR LENS PLACEMENT (IOC);  Surgeon: [name], MD;  Location: [facility] ORS;  Service: Ophthalmology;  Laterality: Left;  [name] 51.8 AP% 12.7 CDE 6.58 Fluid Pack Lot #  [id number]  . CHOLECYSTECTOMY  1993  . COLONOSCOPY  02/12/2007   YQI:[id number] friable anal canal hemorrhoids, otherwise normal rectum and colon  . COLONOSCOPY N/A 07/16/2012   RMR: Colonic diverticulosis. colinic polyp-removed as described above.   . ESOPHAGOGASTRODUODENOSCOPY (EGD) WITH ESOPHAGEAL DILATION N/A 07/16/2012   RMR: Abnormal esophagus suspicious for Candida-status pos KOH brushing. Erosive reflux esophagiits. Schatizi's ring day status post biopsy. Hiatal hernia. Antral erosions status post biopsy.   Marland Kitchen HERNIA REPAIR    . JOINT REPLACEMENT    . KNEE SURGERY     Left knee arthroscopy torn medial meniscus grade 4 chondral changes medial femoral condyle tibial plateau  . TOTAL KNEE ARTHROPLASTY Left 04/27/2012   Procedure: TOTAL KNEE ARTHROPLASTY;  Surgeon: Carole Civil, MD;  Location: [facility];  Service: Orthopedics;  Laterality: Left;  . UMBILICAL HERNIA REPAIR  1993    No Known Allergies  Current Outpatient Medications  Medication Sig Dispense Refill  . aspirin 325 MG tablet Take 1 tablet (325 mg total) by mouth daily. 30 tablet 1  . Aspirin-Salicylamide-Caffeine (BC HEADACHE POWDER PO) Take 1 Package by mouth daily as needed (headache).    . baclofen (LIORESAL) 10 MG tablet Take 1 tablet (10 mg total) by mouth 3 (three) times daily. 30 each 0  . carvedilol (COREG) 3.125 MG tablet Take 3.125 mg by mouth.    . cetirizine (ZYRTEC) 10 MG tablet Take 10 mg by mouth daily.    Marland Kitchen JANUMET 50-1000 MG tablet Take 1 tablet by mouth 2 (two) times daily.  6  . lovastatin (MEVACOR) 40 MG tablet Take 40 mg by mouth daily.     Marland Kitchen PROVENTIL HFA 108 (90 Base) MCG/ACT inhaler Inhale 2 PUFFS BY MOUTH 4 TIMES DAILY  1  . ranitidine (ZANTAC) 75 MG tablet Take 75 mg by mouth daily.    . rosuvastatin (CRESTOR) 40 MG tablet Take 40 mg by mouth at bedtime.  1  . HYDROcodone-acetaminophen (NORCO/VICODIN) 5-325 MG tablet Take 1 tablet by mouth every 4 (four) hours as needed. (Patient not taking:  Reported on 08/21/2017) 20 tablet 0  . meloxicam (MOBIC) 15 MG tablet Take 1 tablet (15 mg total) by mouth daily. Take 1 daily with food. (Patient not taking: Reported on 08/21/2017) 10 tablet 0  . naproxen (NAPROSYN) 375 MG tablet Take 1 tablet (375 mg total) by mouth 2 (two) times daily. (Patient not taking: Reported on 08/21/2017) 20 tablet 0   No current facility-administered medications for this visit.     ROS: See HPI for pertinent positives and negatives.   Physical Examination  Vitals:   08/21/17 1019 08/21/17 1023  BP: (!) 173/104 (!) 176/102  Pulse: 84 85  Resp: 18  Temp: 98.1 F (36.7 C)   TempSrc: Oral   SpO2: 94%   Weight: 158 lb (71.7 kg)   Height: 5\' 7"  (1.702 m)    Body mass index is 24.75 kg/m.     ASSESSMENT: Jimmy Duran is a 62 y.o. male who has a known stable infrarenal aortic focal dissection.  Patient left after his abdominal aortic duplex, and before I could evaluate him.    DATA  Abdominal Aorta Duplex (08/21/17): Indications: Follow up infrarenal aortic focal dissection and left common iliac       artery measuring 1.9 cms. Limitations: Air/bowel gas. Suboptimal exam, limited visualization throughout due to excessive bowel gas and breathing artifact. Unable to visualized dissection or infrarenal aortic aneurysm. The segment of the left common iliac artery visualized measured 1.9 cms. as comparable to CT 08/05/2016.  Tri and biphasic waveforms throughout.     PLAN:  Based on the patient's vascular studies and examination, pt will return to clinic in 1 year with bilateral aortoiliac duplex.   I discussed in depth with the patient the nature of atherosclerosis, and emphasized the importance of maximal medical management including strict control of blood pressure, blood glucose, and lipid levels, obtaining regular exercise, and cessation of soking.  The patient is aware that without maximal medical management the underlying  atherosclerotic disease process will progress, limiting the benefit of any interventions.  The patient was given information about PAD including signs, symptoms, treatment, what symptoms should prompt the patient to seek immediate medical care, and risk reduction measures to take.  Clemon Chambers, RN, MSN, FNP-C [facility] [employer] Phone: [phone]  Clinic MD: [facility]  08/21/17 10:47 AM

## 2017-08-21 NOTE — Patient Instructions (Addendum)
Steps to Quit Smoking Smoking tobacco can be bad for your health. It can also affect almost every organ in your body. Smoking puts you and people around you at risk for many serious long-lasting (chronic) diseases. Quitting smoking is hard, but it is one of the best things that you can do for your health. It is never too late to quit. What are the benefits of quitting smoking? When you quit smoking, you lower your risk for getting serious diseases and conditions. They can include:  Lung cancer or lung disease.  Heart disease.  Stroke.  Heart attack.  Not being able to have children (infertility).  Weak bones (osteoporosis) and broken bones (fractures).  If you have coughing, wheezing, and shortness of breath, those symptoms may get better when you quit. You may also get sick less often. If you are pregnant, quitting smoking can help to lower your chances of having a baby of low birth weight. What can I do to help me quit smoking? Talk with your doctor about what can help you quit smoking. Some things you can do (strategies) include:  Quitting smoking totally, instead of slowly cutting back how much you smoke over a period of time.  Going to in-person counseling. You are more likely to quit if you go to many counseling sessions.  Using resources and support systems, such as: ? Database administrator with a Social worker. ? Phone quitlines. ? Careers information officer. ? Support groups or group counseling. ? Text messaging programs. ? Mobile phone apps or applications.  Taking medicines. Some of these medicines may have nicotine in them. If you are pregnant or breastfeeding, do not take any medicines to quit smoking unless your doctor says it is okay. Talk with your doctor about counseling or other things that can help you.  Talk with your doctor about using more than one strategy at the same time, such as taking medicines while you are also going to in-person counseling. This can help make  quitting easier. What things can I do to make it easier to quit? Quitting smoking might feel very hard at first, but there is a lot that you can do to make it easier. Take these steps:  Talk to your family and friends. Ask them to support and encourage you.  Call phone quitlines, reach out to support groups, or work with a Social worker.  Ask people who smoke to not smoke around you.  Avoid places that make you want (trigger) to smoke, such as: ? Bars. ? Parties. ? Smoke-break areas at work.  Spend time with people who do not smoke.  Lower the stress in your life. Stress can make you want to smoke. Try these things to help your stress: ? Getting regular exercise. ? Deep-breathing exercises. ? Yoga. ? Meditating. ? Doing a body scan. To do this, close your eyes, focus on one area of your body at a time from head to toe, and notice which parts of your body are tense. Try to relax the muscles in those areas.  Download or buy apps on your mobile phone or tablet that can help you stick to your quit plan. There are many free apps, such as QuitGuide from the [employer] Office manager for Disease Control and Prevention). You can find more support from smokefree.gov and other websites.  This information is not intended to replace advice given to you by your health care provider. Make sure you discuss any questions you have with your health care provider. Document Released: 12/15/2008 Document  Revised: 10/17/2015 Document Reviewed: 07/05/2014 Elsevier Interactive Patient Education  2018 [employer].     Peripheral Vascular Disease Peripheral vascular disease (PVD) is a disease of the blood vessels that are not part of your heart and brain. A simple term for PVD is poor circulation. In most cases, PVD narrows the blood vessels that carry blood from your heart to the rest of your body. This can result in a decreased supply of blood to your arms, legs, and internal organs, like your stomach or kidneys.  However, it most often affects a person's lower legs and feet. There are two types of PVD.  Organic PVD. This is the more common type. It is caused by damage to the structure of blood vessels.  Functional PVD. This is caused by conditions that make blood vessels contract and tighten (spasm).  Without treatment, PVD tends to get worse over time. PVD can also lead to acute ischemic limb. This is when an arm or limb suddenly has trouble getting enough blood. This is a medical emergency. Follow these instructions at home:  Take medicines only as told by your doctor.  Do not use any tobacco products, including cigarettes, chewing tobacco, or electronic cigarettes. If you need help quitting, ask your doctor.  Lose weight if you are overweight, and maintain a healthy weight as told by your doctor.  Eat a diet that is low in fat and cholesterol. If you need help, ask your doctor.  Exercise regularly. Ask your doctor for some good activities for you.  Take good care of your feet. ? Wear comfortable shoes that fit well. ? Check your feet often for any cuts or sores. Contact a doctor if:  You have cramps in your legs while walking.  You have leg pain when you are at rest.  You have coldness in a leg or foot.  Your skin changes.  You are unable to get or have an erection (erectile dysfunction).  You have cuts or sores on your feet that are not healing. Get help right away if:  Your arm or leg turns cold and blue.  Your arms or legs become red, warm, swollen, painful, or numb.  You have chest pain or trouble breathing.  You suddenly have weakness in your face, arm, or leg.  You become very confused or you cannot speak.  You suddenly have a very bad headache.  You suddenly cannot see. This information is not intended to replace advice given to you by your health care provider. Make sure you discuss any questions you have with your health care provider. Document Released:  05/15/2009 Document Revised: 07/27/2015 Document Reviewed: 07/29/2013 Elsevier Interactive Patient Education  2017 [employer].    Jimmy Duran, I am sorry I missed you. The ultrasound of your abdomen was limited due to excessive bowel gas.   To get better accuracy and visualization on the next ultrasound, please do the following:  Before your next abdominal ultrasound:  Take two Extra-Strength Gas-X capsules at bedtime the night before the test. Take another two Extra-Strength Gas-X capsules 3 hours before the test.  Avoid gas forming foods and beverages the day before the test.    Your risk factors for the aneurysms getting bigger are your uncontrolled high blood pressure and tobacco use.   Pleas follow up in 1 year with the same ultrasound and see me afterward for discussion of results.

## 2017-11-05 ENCOUNTER — Ambulatory Visit: Payer: Medicaid Other | Admitting: Gastroenterology

## 2017-11-05 ENCOUNTER — Encounter: Payer: Self-pay | Admitting: Gastroenterology

## 2017-11-05 ENCOUNTER — Other Ambulatory Visit: Payer: Self-pay

## 2017-11-05 VITALS — BP 133/87 | HR 99 | Temp 97.4°F | Ht 67.0 in | Wt 159.4 lb

## 2017-11-05 DIAGNOSIS — R1319 Other dysphagia: Secondary | ICD-10-CM

## 2017-11-05 DIAGNOSIS — Z860100 Personal history of colon polyps, unspecified: Secondary | ICD-10-CM

## 2017-11-05 DIAGNOSIS — R1310 Dysphagia, unspecified: Secondary | ICD-10-CM

## 2017-11-05 MED ORDER — PANTOPRAZOLE SODIUM 40 MG PO TBEC: 40.0000 mg | DELAYED_RELEASE_TABLET | Freq: Every day | ORAL | 3 refills | Status: DC

## 2017-11-05 MED ORDER — CLENPIQ 10-3.5-12 MG-GM -GM/160ML PO SOLN: 1.0000 | Freq: Once | ORAL | 0 refills | Status: AC

## 2017-11-05 NOTE — Progress Notes (Signed)
Primary Care Physician:  Gara Kroner, DO Primary Gastroenterologist:  Dr. Gala Romney   Chief Complaint  Patient presents with  . Consult    TCS. alst one was 5 yrs ago  . Hemorrhoids    "bleeding at times"  . Constipation  . Abdominal Pain    "tenderness in certain spots"    HPI:   Jimmy Duran is a 62 y.o. male presenting today to arrange surveillance colonoscopy with history of adenomas in 2014. Chronic history of GERD, with last EGD in 2014 s/p dilation of Schatzki's ring. Candida esophagitis also noted at that time.   Vague lower abdominal discomfort at times. Felt sore with palpation recently. Intermittent. Sometimes worsened after bending over. Has to pull belt down lower so it doesn't interfere with it. No constipation. Has a BM every few days, no straining. Noting solid food dysphagia. Felt like food backing up at times. Had been prescribed Dexilant but has been out over a week. States Dexilant is not helpful. States he has prolapsing hemorrhoids at times. Intermittent rectal bleeding.   Family history of colon cancer: maternal great aunt and paternal great aunt, no first-degree relatives.    Past Medical History:  Diagnosis Date  . Arthritis   . Carotid artery disease ([place])   . Colon polyp   . COPD (chronic obstructive pulmonary disease) ([place])   . Coronary artery disease    Nonobstructive  . Diabetes mellitus   . Dyspnea   . Dysrhythmia   . Essential hypertension, benign   . GERD (gastroesophageal reflux disease)   . Hemorrhoids   . History of orthopnea   . Hypertension   . Low back pain   . Lumbar radiculopathy   . Mixed hyperlipidemia   . Stroke [facility])    "ministrokes"  . Type 2 diabetes mellitus ([place])     Past Surgical History:  Procedure Laterality Date  . APPENDECTOMY  1970  . BREAST SURGERY     Cyst resection on the right  . CATARACT EXTRACTION W/PHACO Right 06/18/2016   Procedure: CATARACT EXTRACTION PHACO AND INTRAOCULAR LENS PLACEMENT  (IOC);  Surgeon: [name], MD;  Location: [facility] ORS;  Service: Ophthalmology;  Laterality: Right;  [name] 35.7 AP% 16.6 CDE 5.94 Fluid pack lot # [id number]  . CATARACT EXTRACTION W/PHACO Left 07/16/2016   Procedure: CATARACT EXTRACTION PHACO AND INTRAOCULAR LENS PLACEMENT (IOC);  Surgeon: [name], MD;  Location: [facility] ORS;  Service: Ophthalmology;  Laterality: Left;  [name] 51.8 AP% 12.7 CDE 6.58 Fluid Pack Lot # [id number]  . CHOLECYSTECTOMY  1993  . COLONOSCOPY  02/12/2007   GYI:[id number] friable anal canal hemorrhoids, otherwise normal rectum and colon  . COLONOSCOPY N/A 07/16/2012   RMR: Colonic diverticulosis. 4 mm tubular adenoma  . ESOPHAGOGASTRODUODENOSCOPY (EGD) WITH ESOPHAGEAL DILATION N/A 07/16/2012   RMR: +Candida esophagitis, Erosive reflux esophagiits. Schatizi's ring status post dilation. Hiatal hernia. Antral erosions status post biopsy.   Marland Kitchen HERNIA REPAIR    . JOINT REPLACEMENT    . KNEE SURGERY     Left knee arthroscopy torn medial meniscus grade 4 chondral changes medial femoral condyle tibial plateau  . TOTAL KNEE ARTHROPLASTY Left 04/27/2012   Procedure: TOTAL KNEE ARTHROPLASTY;  Surgeon: Carole Civil, MD;  Location: [facility];  Service: Orthopedics;  Laterality: Left;  . UMBILICAL HERNIA REPAIR  1993    Current Outpatient Medications  Medication Sig Dispense Refill  . amitriptyline (ELAVIL) 25 MG tablet Take 25 mg by mouth at bedtime.    Marland Kitchen  amLODipine (NORVASC) 10 MG tablet Take 10 mg by mouth daily.    Marland Kitchen aspirin EC 81 MG tablet Take 81 mg by mouth daily.    . Aspirin-Salicylamide-Caffeine (BC HEADACHE POWDER PO) Take 1 Package by mouth daily as needed (headache).    . busPIRone (BUSPAR) 7.5 MG tablet Take 1 tablet by mouth daily.  3  . dexlansoprazole (DEXILANT) 60 MG capsule Take 60 mg by mouth daily.    . Fluticasone Furoate-Vilanterol (BREO ELLIPTA IN) Inhale 1 puff into the lungs daily as needed.    Marland Kitchen losartan (COZAAR) 100 MG tablet Take 100 mg by mouth  daily.    Marland Kitchen lovastatin (MEVACOR) 40 MG tablet Take 40 mg by mouth daily.     . metFORMIN (GLUCOPHAGE) 1000 MG tablet Take 1,000 mg by mouth 2 (two) times daily with a meal.    . olmesartan (BENICAR) 40 MG tablet Take 40 mg by mouth daily.    Marland Kitchen PARoxetine (PAXIL) 20 MG tablet Take 1 tablet by mouth daily.  3  . rosuvastatin (CRESTOR) 40 MG tablet Take 40 mg by mouth at bedtime.  1  . pantoprazole (PROTONIX) 40 MG tablet Take 1 tablet (40 mg total) by mouth daily. 60 tablet 3   No current facility-administered medications for this visit.     Allergies as of 11/05/2017  . (No Known Allergies)    Family History  Problem Relation Age of Onset  . Colon polyps Mother   . Coronary artery disease Father        CABG in his 36's  . Colon cancer Other        maternal great aunt  . Colon cancer Other        paternal great aunt    Social History   Socioeconomic History  . Marital status: Married    Spouse name: Not on file  . Number of children: Not on file  . Years of education: Not on file  . Highest education level: Not on file  Occupational History  . Occupation: Retail banker: [employer]  . Financial resource strain: Not on file  . Food insecurity:    Worry: Not on file    Inability: Not on file  . Transportation needs:    Medical: Not on file    Non-medical: Not on file  Tobacco Use  . Smoking status: Current Every Day Smoker    Packs/day: 0.50    Years: 30.00    Pack years: 15.00    Types: Cigarettes  . Smokeless tobacco: Current User    Types: Chew  . Tobacco comment: 1 pk per day.   Substance and Sexual Activity  . Alcohol use: Yes    Alcohol/week: 0.0 standard drinks    Comment: socially "beer and tomato juice"   . Drug use: No  . Sexual activity: Yes    Birth control/protection: None  Lifestyle  . Physical activity:    Days per week: Not on file    Minutes per session: Not on file  . Stress: Not on file  Relationships  .  Social connections:    Talks on phone: Not on file    Gets together: Not on file    Attends religious service: Not on file    Active member of club or organization: Not on file    Attends meetings of clubs or organizations: Not on file    Relationship status: Not on file  . Intimate partner violence:  Fear of current or ex partner: Not on file    Emotionally abused: Not on file    Physically abused: Not on file    Forced sexual activity: Not on file  Other Topics Concern  . Not on file  Social History Narrative  . Not on file    Review of Systems: Gen: Denies any fever, chills, fatigue, weight loss, lack of appetite.  CV: Denies chest pain, heart palpitations, peripheral edema, syncope.  Resp: Denies shortness of breath at rest or with exertion. Denies wheezing or cough.  GI: see HPI  GU : Denies urinary burning, urinary frequency, urinary hesitancy MS: Denies joint pain, muscle weakness, cramps, or limitation of movement.  Derm: Denies rash, itching, dry skin Psych: Denies depression, anxiety, memory loss, and confusion Heme: see HPI   Physical Exam: BP 133/87   Pulse 99   Temp (!) 97.4 F (36.3 C) (Oral)   Ht 5\' 7"  (1.702 m)   Wt 159 lb 6.4 oz (72.3 kg)   BMI 24.97 kg/m  General:   Alert and oriented. Pleasant and cooperative. Well-nourished and well-developed.  Head:  Normocephalic and atraumatic. Eyes:  Without icterus, sclera clear and conjunctiva pink.  Ears:  Normal auditory acuity. Nose:  No deformity, discharge,  or lesions. Mouth:  No deformity or lesions, oral mucosa pink.  Lungs:  Clear to auscultation bilaterally. No wheezes, rales, or rhonchi. No distress.  Heart:  S1, S2 present without murmurs appreciated.  Abdomen:  +BS, soft, non-tender and non-distended. No HSM noted. No guarding or rebound. No masses appreciated.  Rectal:  Deferred  Msk:  Symmetrical without gross deformities. Normal posture. Extremities:  Without edema. Neurologic:  Alert and   oriented x4 Skin:  Intact without significant lesions or rashes. Psych:  Alert and cooperative. Normal mood and affect.

## 2017-11-05 NOTE — Patient Instructions (Signed)
AB advised for pt to not take Metformin morning to TCS/EGD/DIL. Noted on pt's instructions.

## 2017-11-05 NOTE — Patient Instructions (Signed)
I sent in Protonix to take twice a day, 30 minutes before breakfast and dinner. This is to take instead of Dexilant.   We have scheduled you for a colonoscopy, upper endoscopy, and dilation with Dr. Gala Romney in the near future!  We can pursue hemorrhoid banding in October if everything turns out well from the colonoscopy!  It was a pleasure to see you today. I strive to create trusting relationships with patients to provide genuine, compassionate, and quality care. I value your feedback. If you receive a survey regarding your visit,  I greatly appreciate you taking time to fill this out.   Annitta Needs, PhD, ANP-BC [facility]

## 2017-11-06 ENCOUNTER — Telehealth: Payer: Self-pay

## 2017-11-06 NOTE — Telephone Encounter (Signed)
Called and informed pt's wife of pre-op appt 12/03/17 at 12:45pm. Letter mailed.

## 2017-11-07 ENCOUNTER — Encounter: Payer: Self-pay | Admitting: Internal Medicine

## 2017-11-19 ENCOUNTER — Encounter: Payer: Self-pay | Admitting: Gastroenterology

## 2017-11-19 NOTE — Assessment & Plan Note (Signed)
Chronic history of GERD, Dexilant without improvement. Schatzki's ring and candida esophagitis in past. Last EGD 2014. Pursue EGD/dilation at time of TCS.  Proceed with upper endoscopy/dilation in the near future with Dr. Gala Romney. The risks, benefits, and alternatives have been discussed in detail with patient. They have stated understanding and desire to proceed.  Propofol due to polypharmacy Stop Dexilant, start Protonix BID

## 2017-11-19 NOTE — Progress Notes (Signed)
CC'D TO PCP °

## 2017-11-19 NOTE — Assessment & Plan Note (Signed)
62 year old male with history of adenoma in 2014, due for surveillance now. Intermittent rectal bleeding with known prior internal hemorrhoids. No first-degree relatives with colon cancer but reports maternal "great" aunt/paternal "great" aunt with colon cancer.   Proceed with TCS with Dr. Gala Romney in near future: the risks, benefits, and alternatives have been discussed with the patient in detail. The patient states understanding and desires to proceed. Propofol due to polypharmacy Return 10/31 for hemorrhoid banding if amenable to this

## 2017-12-01 NOTE — Patient Instructions (Signed)
[place]  12/01/2017     @PREFPERIOPPHARMACY @   Your procedure is scheduled on  12/08/2017   Report to [facility] at  615  A.M.  Call this number if you have problems the morning of surgery:  [phone]   Remember:  Do not eat or drink after midnight.  You may drink clear liquids until ( follow the instructions given to you) .  Clear liquids allowed are:                    Water, Juice (non-citric and without pulp), Carbonated beverages, Clear Tea, Black Coffee only, Plain Jell-O only, Gatorade and Plain Popsicles only    Take these medicines the morning of surgery with A SIP OF WATER  Amlodipine, buspar, losartan, protonix, paxil.    Do not wear jewelry, make-up or nail polish.  Do not wear lotions, powders, or perfumes, or deodorant.  Do not shave 48 hours prior to surgery.  Men may shave face and neck.  Do not bring valuables to the hospital.  [facility] is not responsible for any belongings or valuables.  Contacts, dentures or bridgework may not be worn into surgery.  Leave your suitcase in the car.  After surgery it may be brought to your room.  For patients admitted to the hospital, discharge time will be determined by your treatment team.  Patients discharged the day of surgery will not be allowed to drive home.   Name and phone number of your driver:   family Special instructions:  Follow the diet and prep instructions given to you by Dr Roseanne Kaufman office.  Please read over the following fact sheets that you were given. Anesthesia Post-op Instructions and Care and Recovery After Surgery       Esophagogastroduodenoscopy Esophagogastroduodenoscopy (EGD) is a procedure to examine the lining of the esophagus, stomach, and first part of the small intestine (duodenum). This procedure is done to check for problems such as inflammation, bleeding, ulcers, or growths. During this procedure, a long, flexible, lighted tube with a camera  attached (endoscope) is inserted down the throat. Tell a health care provider about:  Any allergies you have.  All medicines you are taking, including vitamins, herbs, eye drops, creams, and over-the-counter medicines.  Any problems you or family members have had with anesthetic medicines.  Any blood disorders you have.  Any surgeries you have had.  Any medical conditions you have.  Whether you are pregnant or may be pregnant. What are the risks? Generally, this is a safe procedure. However, problems may occur, including:  Infection.  Bleeding.  A tear (perforation) in the esophagus, stomach, or duodenum.  Trouble breathing.  Excessive sweating.  Spasms of the larynx.  A slowed heartbeat.  Low blood pressure.  What happens before the procedure?  Follow instructions from your health care provider about eating or drinking restrictions.  Ask your health care provider about: ? Changing or stopping your regular medicines. This is especially important if you are taking diabetes medicines or blood thinners. ? Taking medicines such as aspirin and ibuprofen. These medicines can thin your blood. Do not take these medicines before your procedure if your health care provider instructs you not to.  Plan to have someone take you home after the procedure.  If you wear dentures, be ready to remove them before the procedure. What happens during the procedure?  To reduce your  risk of infection, your health care team will wash or sanitize their hands.  An IV tube will be put in a vein in your hand or arm. You will get medicines and fluids through this tube.  You will be given one or more of the following: ? A medicine to help you relax (sedative). ? A medicine to numb the area (local anesthetic). This medicine may be sprayed into your throat. It will make you feel more comfortable and keep you from gagging or coughing during the procedure. ? A medicine for pain.  A mouth guard  may be placed in your mouth to protect your teeth and to keep you from biting on the endoscope.  You will be asked to lie on your left side.  The endoscope will be lowered down your throat into your esophagus, stomach, and duodenum.  Air will be put into the endoscope. This will help your health care provider see better.  The lining of your esophagus, stomach, and duodenum will be examined.  Your health care provider may: ? Take a tissue sample so it can be looked at in a lab (biopsy). ? Remove growths. ? Remove objects (foreign bodies) that are stuck. ? Treat any bleeding with medicines or other devices that stop tissue from bleeding. ? Widen (dilate) or stretch narrowed areas of your esophagus and stomach.  The endoscope will be taken out. The procedure may vary among health care providers and hospitals. What happens after the procedure?  Your blood pressure, heart rate, breathing rate, and blood oxygen level will be monitored often until the medicines you were given have worn off.  Do not eat or drink anything until the numbing medicine has worn off and your gag reflex has returned. This information is not intended to replace advice given to you by your health care provider. Make sure you discuss any questions you have with your health care provider. Document Released: 06/21/2004 Document Revised: 07/27/2015 Document Reviewed: 01/12/2015 Elsevier Interactive Patient Education  2018 [employer]. Esophagogastroduodenoscopy, Care After Refer to this sheet in the next few weeks. These instructions provide you with information about caring for yourself after your procedure. Your health care provider may also give you more specific instructions. Your treatment has been planned according to current medical practices, but problems sometimes occur. Call your health care provider if you have any problems or questions after your procedure. What can I expect after the procedure? After the  procedure, it is common to have:  A sore throat.  Nausea.  Bloating.  Dizziness.  Fatigue.  Follow these instructions at home:  Do not eat or drink anything until the numbing medicine (local anesthetic) has worn off and your gag reflex has returned. You will know that the local anesthetic has worn off when you can swallow comfortably.  Do not drive for 24 hours if you received a medicine to help you relax (sedative).  If your health care provider took a tissue sample for testing during the procedure, make sure to get your test results. This is your responsibility. Ask your health care provider or the department performing the test when your results will be ready.  Keep all follow-up visits as told by your health care provider. This is important. Contact a health care provider if:  You cannot stop coughing.  You are not urinating.  You are urinating less than usual. Get help right away if:  You have trouble swallowing.  You cannot eat or drink.  You have throat or  chest pain that gets worse.  You are dizzy or light-headed.  You faint.  You have nausea or vomiting.  You have chills.  You have a fever.  You have severe abdominal pain.  You have black, tarry, or bloody stools. This information is not intended to replace advice given to you by your health care provider. Make sure you discuss any questions you have with your health care provider. Document Released: 02/05/2012 Document Revised: 07/27/2015 Document Reviewed: 01/12/2015 Elsevier Interactive Patient Education  2018 [employer].  Esophageal Dilatation Esophageal dilatation is a procedure to open a blocked or narrowed part of the esophagus. The esophagus is the long tube in your throat that carries food and liquid from your mouth to your stomach. The procedure is also called esophageal dilation. You may need this procedure if you have a buildup of scar tissue in your esophagus that makes it difficult,  painful, or even impossible to swallow. This can be caused by gastroesophageal reflux disease (GERD). In rare cases, people need this procedure because they have cancer of the esophagus or a problem with the way food moves through the esophagus. Sometimes you may need to have another dilatation to enlarge the opening of the esophagus gradually. Tell a health care provider about:  Any allergies you have.  All medicines you are taking, including vitamins, herbs, eye drops, creams, and over-the-counter medicines.  Any problems you or family members have had with anesthetic medicines.  Any blood disorders you have.  Any surgeries you have had.  Any medical conditions you have.  Any antibiotic medicines you are required to take before dental procedures. What are the risks? Generally, this is a safe procedure. However, problems can occur and include:  Bleeding from a tear in the lining of the esophagus.  A hole (perforation) in the esophagus.  What happens before the procedure?  Do not eat or drink anything after midnight on the night before the procedure or as directed by your health care provider.  Ask your health care provider about changing or stopping your regular medicines. This is especially important if you are taking diabetes medicines or blood thinners.  Plan to have someone take you home after the procedure. What happens during the procedure?  You will be given a medicine that makes you relaxed and sleepy (sedative).  A medicine may be sprayed or gargled to numb the back of the throat.  Your health care provider can use various instruments to do an esophageal dilatation. During the procedure, the instrument used will be placed in your mouth and passed down into your esophagus. Options include: ? Simple dilators. This instrument is carefully placed in the esophagus to stretch it. ? Guided wire bougies. In this method, a flexible tube (endoscope) is used to insert a wire into  the esophagus. The dilator is passed over this wire to enlarge the esophagus. Then the wire is removed. ? Balloon dilators. An endoscope with a small balloon at the end is passed down into the esophagus. Inflating the balloon gently stretches the esophagus and opens it up. What happens after the procedure?  Your blood pressure, heart rate, breathing rate, and blood oxygen level will be monitored often until the medicines you were given have worn off.  Your throat may feel slightly sore and will probably still feel numb. This will improve slowly over time.  You will not be allowed to eat or drink until the throat numbness has resolved.  If this is a same-day procedure, you  may be allowed to go home once you have been able to drink, urinate, and sit on the edge of the bed without nausea or dizziness.  If this is a same-day procedure, you should have a friend or family member with you for the next 24 hours after the procedure. This information is not intended to replace advice given to you by your health care provider. Make sure you discuss any questions you have with your health care provider. Document Released: 04/11/2005 Document Revised: 07/27/2015 Document Reviewed: 06/30/2013 Elsevier Interactive Patient Education  [employer].  Colonoscopy, Adult A colonoscopy is an exam to look at the large intestine. It is done to check for problems, such as:  Lumps (tumors).  Growths (polyps).  Swelling (inflammation).  Bleeding.  What happens before the procedure? Eating and drinking Follow instructions from your doctor about eating and drinking. These instructions may include:  A few days before the procedure - follow a low-fiber diet. ? Avoid nuts. ? Avoid seeds. ? Avoid dried fruit. ? Avoid raw fruits. ? Avoid vegetables.  1-3 days before the procedure - follow a clear liquid diet. Avoid liquids that have red or purple dye. Drink only clear liquids, such as: ? Clear broth or  bouillon. ? Black coffee or tea. ? Clear juice. ? Clear soft drinks or sports drinks. ? Gelatin dessert. ? Popsicles.  On the day of the procedure - do not eat or drink anything during the 2 hours before the procedure.  Bowel prep If you were prescribed an oral bowel prep:  Take it as told by your doctor. Starting the day before your procedure, you will need to drink a lot of liquid. The liquid will cause you to poop (have bowel movements) until your poop is almost clear or light green.  If your skin or butt gets irritated from diarrhea, you may: ? Wipe the area with wipes that have medicine in them, such as adult wet wipes with aloe and vitamin E. ? Put something on your skin that soothes the area, such as petroleum jelly.  If you throw up (vomit) while drinking the bowel prep, take a break for up to 60 minutes. Then begin the bowel prep again. If you keep throwing up and you cannot take the bowel prep without throwing up, call your doctor.  General instructions  Ask your doctor about changing or stopping your normal medicines. This is important if you take diabetes medicines or blood thinners.  Plan to have someone take you home from the hospital or clinic. What happens during the procedure?  An IV tube may be put into one of your veins.  You will be given medicine to help you relax (sedative).  To reduce your risk of infection: ? Your doctors will wash their hands. ? Your anal area will be washed with soap.  You will be asked to lie on your side with your knees bent.  Your doctor will get a long, thin, flexible tube ready. The tube will have a camera and a light on the end.  The tube will be put into your anus.  The tube will be gently put into your large intestine.  Air will be delivered into your large intestine to keep it open. You may feel some pressure or cramping.  The camera will be used to take photos.  A small tissue sample may be removed from your body to  be looked at under a microscope (biopsy). If any possible problems are found, the  tissue will be sent to a lab for testing.  If small growths are found, your doctor may remove them and have them checked for cancer.  The tube that was put into your anus will be slowly removed. The procedure may vary among doctors and hospitals. What happens after the procedure?  Your doctor will check on you often until the medicines you were given have worn off.  Do not drive for 24 hours after the procedure.  You may have a small amount of blood in your poop.  You may pass gas.  You may have mild cramps or bloating in your belly (abdomen).  It is up to you to get the results of your procedure. Ask your doctor, or the department performing the procedure, when your results will be ready. This information is not intended to replace advice given to you by your health care provider. Make sure you discuss any questions you have with your health care provider. Document Released: 03/23/2010 Document Revised: 12/20/2015 Document Reviewed: 05/02/2015 Elsevier Interactive Patient Education  2017 [employer]  Colonoscopy, Adult, Care After This sheet gives you information about how to care for yourself after your procedure. Your health care provider may also give you more specific instructions. If you have problems or questions, contact your health care provider. What can I expect after the procedure? After the procedure, it is common to have:  A small amount of blood in your stool for 24 hours after the procedure.  Some gas.  Mild abdominal cramping or bloating.  Follow these instructions at home: General instructions   For the first 24 hours after the procedure: ? Do not drive or use machinery. ? Do not sign important documents. ? Do not drink alcohol. ? Do your regular daily activities at a slower pace than normal. ? Eat soft, easy-to-digest foods. ? Rest often.  Take over-the-counter or  prescription medicines only as told by your health care provider.  It is up to you to get the results of your procedure. Ask your health care provider, or the department performing the procedure, when your results will be ready. Relieving cramping and bloating  Try walking around when you have cramps or feel bloated.  Apply heat to your abdomen as told by your health care provider. Use a heat source that your health care provider recommends, such as a moist heat pack or a heating pad. ? Place a towel between your skin and the heat source. ? Leave the heat on for 20-30 minutes. ? Remove the heat if your skin turns bright red. This is especially important if you are unable to feel pain, heat, or cold. You may have a greater risk of getting burned. Eating and drinking  Drink enough fluid to keep your urine clear or pale yellow.  Resume your normal diet as instructed by your health care provider. Avoid heavy or fried foods that are hard to digest.  Avoid drinking alcohol for as long as instructed by your health care provider. Contact a health care provider if:  You have blood in your stool 2-3 days after the procedure. Get help right away if:  You have more than a small spotting of blood in your stool.  You pass large blood clots in your stool.  Your abdomen is swollen.  You have nausea or vomiting.  You have a fever.  You have increasing abdominal pain that is not relieved with medicine. This information is not intended to replace advice given to you by your  health care provider. Make sure you discuss any questions you have with your health care provider. Document Released: 10/03/2003 Document Revised: 11/13/2015 Document Reviewed: 05/02/2015 Elsevier Interactive Patient Education  2018 [employer] Anesthesia is a term that refers to techniques, procedures, and medicines that help a person stay safe and comfortable during a medical procedure.  Monitored anesthesia care, or sedation, is one type of anesthesia. Your anesthesia specialist may recommend sedation if you will be having a procedure that does not require you to be unconscious, such as:  Cataract surgery.  A dental procedure.  A biopsy.  A colonoscopy.  During the procedure, you may receive a medicine to help you relax (sedative). There are three levels of sedation:  Mild sedation. At this level, you may feel awake and relaxed. You will be able to follow directions.  Moderate sedation. At this level, you will be sleepy. You may not remember the procedure.  Deep sedation. At this level, you will be asleep. You will not remember the procedure.  The more medicine you are given, the deeper your level of sedation will be. Depending on how you respond to the procedure, the anesthesia specialist may change your level of sedation or the type of anesthesia to fit your needs. An anesthesia specialist will monitor you closely during the procedure. Let your health care provider know about:  Any allergies you have.  All medicines you are taking, including vitamins, herbs, eye drops, creams, and over-the-counter medicines.  Any use of steroids (by mouth or as a cream).  Any problems you or family members have had with sedatives and anesthetic medicines.  Any blood disorders you have.  Any surgeries you have had.  Any medical conditions you have, such as sleep apnea.  Whether you are pregnant or may be pregnant.  Any use of cigarettes, alcohol, or street drugs. What are the risks? Generally, this is a safe procedure. However, problems may occur, including:  Getting too much medicine (oversedation).  Nausea.  Allergic reaction to medicines.  Trouble breathing. If this happens, a breathing tube may be used to help with breathing. It will be removed when you are awake and breathing on your own.  Heart trouble.  Lung trouble.  Before the procedure Staying  hydrated Follow instructions from your health care provider about hydration, which may include:  Up to 2 hours before the procedure - you may continue to drink clear liquids, such as water, clear fruit juice, black coffee, and plain tea.  Eating and drinking restrictions Follow instructions from your health care provider about eating and drinking, which may include:  8 hours before the procedure - stop eating heavy meals or foods such as meat, fried foods, or fatty foods.  6 hours before the procedure - stop eating light meals or foods, such as toast or cereal.  6 hours before the procedure - stop drinking milk or drinks that contain milk.  2 hours before the procedure - stop drinking clear liquids.  Medicines Ask your health care provider about:  Changing or stopping your regular medicines. This is especially important if you are taking diabetes medicines or blood thinners.  Taking medicines such as aspirin and ibuprofen. These medicines can thin your blood. Do not take these medicines before your procedure if your health care provider instructs you not to.  Tests and exams  You will have a physical exam.  You may have blood tests done to show: ? How well your kidneys and liver  are working. ? How well your blood can clot.  General instructions  Plan to have someone take you home from the hospital or clinic.  If you will be going home right after the procedure, plan to have someone with you for 24 hours.  What happens during the procedure?  Your blood pressure, heart rate, breathing, level of pain and overall condition will be monitored.  An IV tube will be inserted into one of your veins.  Your anesthesia specialist will give you medicines as needed to keep you comfortable during the procedure. This may mean changing the level of sedation.  The procedure will be performed. After the procedure  Your blood pressure, heart rate, breathing rate, and blood oxygen level  will be monitored until the medicines you were given have worn off.  Do not drive for 24 hours if you received a sedative.  You may: ? Feel sleepy, clumsy, or nauseous. ? Feel forgetful about what happened after the procedure. ? Have a sore throat if you had a breathing tube during the procedure. ? Vomit. This information is not intended to replace advice given to you by your health care provider. Make sure you discuss any questions you have with your health care provider. Document Released: 11/14/2004 Document Revised: 07/28/2015 Document Reviewed: 06/11/2015 Elsevier Interactive Patient Education  2018 [employer], Care After These instructions provide you with information about caring for yourself after your procedure. Your health care provider may also give you more specific instructions. Your treatment has been planned according to current medical practices, but problems sometimes occur. Call your health care provider if you have any problems or questions after your procedure. What can I expect after the procedure? After your procedure, it is common to:  Feel sleepy for several hours.  Feel clumsy and have poor balance for several hours.  Feel forgetful about what happened after the procedure.  Have poor judgment for several hours.  Feel nauseous or vomit.  Have a sore throat if you had a breathing tube during the procedure.  Follow these instructions at home: For at least 24 hours after the procedure:   Do not: ? Participate in activities in which you could fall or become injured. ? Drive. ? Use heavy machinery. ? Drink alcohol. ? Take sleeping pills or medicines that cause drowsiness. ? Make important decisions or sign legal documents. ? Take care of children on your own.  Rest. Eating and drinking  Follow the diet that is recommended by your health care provider.  If you vomit, drink water, juice, or soup when you can drink without  vomiting.  Make sure you have little or no nausea before eating solid foods. General instructions  Have a responsible adult stay with you until you are awake and alert.  Take over-the-counter and prescription medicines only as told by your health care provider.  If you smoke, do not smoke without supervision.  Keep all follow-up visits as told by your health care provider. This is important. Contact a health care provider if:  You keep feeling nauseous or you keep vomiting.  You feel light-headed.  You develop a rash.  You have a fever. Get help right away if:  You have trouble breathing. This information is not intended to replace advice given to you by your health care provider. Make sure you discuss any questions you have with your health care provider. Document Released: 06/11/2015 Document Revised: 10/11/2015 Document Reviewed: 06/11/2015 Elsevier Interactive Patient Education  2018  [employer].

## 2017-12-03 ENCOUNTER — Encounter (HOSPITAL_COMMUNITY): Payer: Self-pay

## 2017-12-03 ENCOUNTER — Encounter (HOSPITAL_COMMUNITY)
Admission: RE | Admit: 2017-12-03 | Discharge: 2017-12-03 | Disposition: A | Discharge status: Home / Self Care | Payer: Medicaid Other | Source: Ambulatory Visit | Attending: Internal Medicine | Admitting: Internal Medicine

## 2017-12-03 ENCOUNTER — Other Ambulatory Visit: Payer: Self-pay

## 2017-12-03 DIAGNOSIS — Z01818 Encounter for other preprocedural examination: Secondary | ICD-10-CM | POA: Diagnosis not present

## 2017-12-03 HISTORY — DX: Peripheral vascular disease, unspecified: I73.9

## 2017-12-03 HISTORY — DX: Abdominal aortic aneurysm, without rupture, unspecified: I71.40

## 2017-12-03 HISTORY — DX: Abdominal aortic aneurysm, without rupture: I71.4

## 2017-12-03 LAB — CBC
HCT: 41.7 % (ref 39.0–52.0)
Hemoglobin: 13.8 g/dL (ref 13.0–17.0)
MCH: 32.2 pg (ref 26.0–34.0)
MCHC: 33.1 g/dL (ref 30.0–36.0)
MCV: 97.2 fL (ref 78.0–100.0)
Platelets: 337 10*3/uL (ref 150–400)
RBC: 4.29 MIL/uL (ref 4.22–5.81)
RDW: 14.3 % (ref 11.5–15.5)
WBC: 6.3 10*3/uL (ref 4.0–10.5)

## 2017-12-03 LAB — BASIC METABOLIC PANEL
Anion gap: 15 (ref 5–15)
BUN: 12 mg/dL (ref 6–20)
CO2: 17 mmol/L — ABNORMAL LOW (ref 22–32)
Calcium: 8.9 mg/dL (ref 8.9–10.3)
Chloride: 112 mmol/L — ABNORMAL HIGH (ref 98–111)
Creatinine, Ser: 1.18 mg/dL (ref 0.61–1.24)
GFR calc Af Amer: >60 mL/min (ref >60)
GFR calc non Af Amer: >60 mL/min (ref >60)
Glucose, Bld: 95 mg/dL (ref 70–99)
Potassium: 3.6 mmol/L (ref 3.5–5.1)
Sodium: 144 mmol/L (ref 135–145)

## 2017-12-03 NOTE — Progress Notes (Signed)
   12/03/17 1320  OBSTRUCTIVE SLEEP APNEA  Have you ever been diagnosed with sleep apnea through a sleep study? Yes  Do you snore loudly (loud enough to be heard through closed doors)?  1  Do you often feel tired, fatigued, or sleepy during the daytime (such as falling asleep during driving or talking to someone)? 1  Has anyone observed you stop breathing during your sleep? 1  Do you have, or are you being treated for high blood pressure? 1  BMI more than 35 kg/m2? 1  Age > 50 (1-yes) 1  Neck circumference greater than:Male 16 inches or larger, Male 17inches or larger? 0  Male Gender (Yes=1) 1  Obstructive Sleep Apnea Score 7

## 2017-12-08 ENCOUNTER — Ambulatory Visit (HOSPITAL_COMMUNITY): Payer: Medicaid Other | Admitting: Anesthesiology

## 2017-12-08 ENCOUNTER — Encounter (HOSPITAL_COMMUNITY)
Admission: RE | Disposition: A | Discharge status: Home / Self Care | Payer: Self-pay | Source: Ambulatory Visit | Attending: Internal Medicine

## 2017-12-08 ENCOUNTER — Ambulatory Visit (HOSPITAL_COMMUNITY)
Admission: RE | Admit: 2017-12-08 | Discharge: 2017-12-08 | Disposition: A | Discharge status: Home / Self Care | Payer: Medicaid Other | Source: Ambulatory Visit | Attending: Internal Medicine | Admitting: Internal Medicine

## 2017-12-08 ENCOUNTER — Encounter (HOSPITAL_COMMUNITY): Payer: Self-pay | Admitting: Anesthesiology

## 2017-12-08 DIAGNOSIS — J449 Chronic obstructive pulmonary disease, unspecified: Secondary | ICD-10-CM | POA: Insufficient documentation

## 2017-12-08 DIAGNOSIS — M1990 Unspecified osteoarthritis, unspecified site: Secondary | ICD-10-CM | POA: Insufficient documentation

## 2017-12-08 DIAGNOSIS — Z79899 Other long term (current) drug therapy: Secondary | ICD-10-CM | POA: Insufficient documentation

## 2017-12-08 DIAGNOSIS — R1310 Dysphagia, unspecified: Secondary | ICD-10-CM

## 2017-12-08 DIAGNOSIS — F17210 Nicotine dependence, cigarettes, uncomplicated: Secondary | ICD-10-CM | POA: Insufficient documentation

## 2017-12-08 DIAGNOSIS — Z7982 Long term (current) use of aspirin: Secondary | ICD-10-CM | POA: Insufficient documentation

## 2017-12-08 DIAGNOSIS — Z800 Family history of malignant neoplasm of digestive organs: Secondary | ICD-10-CM | POA: Diagnosis not present

## 2017-12-08 DIAGNOSIS — I714 Abdominal aortic aneurysm, without rupture: Secondary | ICD-10-CM | POA: Insufficient documentation

## 2017-12-08 DIAGNOSIS — R1314 Dysphagia, pharyngoesophageal phase: Secondary | ICD-10-CM | POA: Diagnosis not present

## 2017-12-08 DIAGNOSIS — K228 Other specified diseases of esophagus: Secondary | ICD-10-CM | POA: Diagnosis not present

## 2017-12-08 DIAGNOSIS — K219 Gastro-esophageal reflux disease without esophagitis: Secondary | ICD-10-CM | POA: Insufficient documentation

## 2017-12-08 DIAGNOSIS — Z86010 Personal history of colonic polyps: Secondary | ICD-10-CM | POA: Insufficient documentation

## 2017-12-08 DIAGNOSIS — Z7984 Long term (current) use of oral hypoglycemic drugs: Secondary | ICD-10-CM | POA: Insufficient documentation

## 2017-12-08 DIAGNOSIS — Z8249 Family history of ischemic heart disease and other diseases of the circulatory system: Secondary | ICD-10-CM | POA: Insufficient documentation

## 2017-12-08 DIAGNOSIS — K449 Diaphragmatic hernia without obstruction or gangrene: Secondary | ICD-10-CM | POA: Diagnosis not present

## 2017-12-08 DIAGNOSIS — E1151 Type 2 diabetes mellitus with diabetic peripheral angiopathy without gangrene: Secondary | ICD-10-CM | POA: Diagnosis not present

## 2017-12-08 DIAGNOSIS — R1319 Other dysphagia: Secondary | ICD-10-CM

## 2017-12-08 DIAGNOSIS — K209 Esophagitis, unspecified: Secondary | ICD-10-CM | POA: Diagnosis not present

## 2017-12-08 DIAGNOSIS — K5730 Diverticulosis of large intestine without perforation or abscess without bleeding: Secondary | ICD-10-CM | POA: Diagnosis not present

## 2017-12-08 DIAGNOSIS — Z1211 Encounter for screening for malignant neoplasm of colon: Secondary | ICD-10-CM | POA: Diagnosis not present

## 2017-12-08 DIAGNOSIS — Z8673 Personal history of transient ischemic attack (TIA), and cerebral infarction without residual deficits: Secondary | ICD-10-CM | POA: Insufficient documentation

## 2017-12-08 DIAGNOSIS — I10 Essential (primary) hypertension: Secondary | ICD-10-CM | POA: Insufficient documentation

## 2017-12-08 DIAGNOSIS — D123 Benign neoplasm of transverse colon: Secondary | ICD-10-CM | POA: Diagnosis not present

## 2017-12-08 DIAGNOSIS — I2510 Atherosclerotic heart disease of native coronary artery without angina pectoris: Secondary | ICD-10-CM | POA: Insufficient documentation

## 2017-12-08 DIAGNOSIS — E782 Mixed hyperlipidemia: Secondary | ICD-10-CM | POA: Diagnosis not present

## 2017-12-08 HISTORY — PX: POLYPECTOMY: SHX5525

## 2017-12-08 HISTORY — PX: BIOPSY: SHX5522

## 2017-12-08 HISTORY — PX: MALONEY DILATION: SHX5535

## 2017-12-08 HISTORY — PX: ESOPHAGOGASTRODUODENOSCOPY (EGD) WITH PROPOFOL: SHX5813

## 2017-12-08 HISTORY — PX: COLONOSCOPY WITH PROPOFOL: SHX5780

## 2017-12-08 LAB — GLUCOSE, CAPILLARY
Glucose-Capillary: 100 mg/dL — ABNORMAL HIGH (ref 70–99)
Glucose-Capillary: 95 mg/dL (ref 70–99)

## 2017-12-08 SURGERY — COLONOSCOPY WITH PROPOFOL
Anesthesia: General

## 2017-12-08 MED ORDER — LIDOCAINE HCL 1 % IJ SOLN
INTRAMUSCULAR | Status: DC | PRN
  Administered 2017-12-08: 50 mg via INTRADERMAL

## 2017-12-08 MED ORDER — LACTATED RINGERS IV SOLN
INTRAVENOUS | Status: DC | PRN
  Administered 2017-12-08: 07:00:00 via INTRAVENOUS

## 2017-12-08 MED ORDER — MIDAZOLAM HCL 5 MG/5ML IJ SOLN
INTRAMUSCULAR | Status: DC | PRN
  Administered 2017-12-08: 2 mg via INTRAVENOUS

## 2017-12-08 MED ORDER — PROPOFOL 500 MG/50ML IV EMUL
INTRAVENOUS | Status: DC | PRN
  Administered 2017-12-08: 150 ug/kg/min via INTRAVENOUS

## 2017-12-08 MED ORDER — CHLORHEXIDINE GLUCONATE CLOTH 2 % EX PADS: 6.0000 | MEDICATED_PAD | Freq: Once | CUTANEOUS | Status: DC

## 2017-12-08 MED ORDER — PROMETHAZINE HCL 25 MG/ML IJ SOLN: 6.2500 mg | INTRAMUSCULAR | Status: DC | PRN

## 2017-12-08 MED ORDER — PROPOFOL 10 MG/ML IV BOLUS
INTRAVENOUS | Status: DC | PRN
  Administered 2017-12-08: 20 mg via INTRAVENOUS

## 2017-12-08 MED ORDER — PROPOFOL 10 MG/ML IV BOLUS
INTRAVENOUS | Status: AC
  Filled 2017-12-08: qty 60

## 2017-12-08 MED ORDER — LIDOCAINE HCL (PF) 1 % IJ SOLN
INTRAMUSCULAR | Status: AC
  Filled 2017-12-08: qty 10

## 2017-12-08 MED ORDER — SUCCINYLCHOLINE CHLORIDE 20 MG/ML IJ SOLN
INTRAMUSCULAR | Status: AC
  Filled 2017-12-08: qty 1

## 2017-12-08 MED ORDER — MIDAZOLAM HCL 2 MG/2ML IJ SOLN
INTRAMUSCULAR | Status: AC
  Filled 2017-12-08: qty 2

## 2017-12-08 MED ORDER — MIDAZOLAM HCL 2 MG/2ML IJ SOLN: 0.5000 mg | Freq: Once | INTRAMUSCULAR | Status: DC | PRN

## 2017-12-08 MED ORDER — HYDROMORPHONE HCL 1 MG/ML IJ SOLN: 0.2500 mg | INTRAMUSCULAR | Status: DC | PRN

## 2017-12-08 MED ORDER — HYDROCODONE-ACETAMINOPHEN 7.5-325 MG PO TABS: 1.0000 | ORAL_TABLET | Freq: Once | ORAL | Status: DC | PRN

## 2017-12-08 NOTE — Anesthesia Postprocedure Evaluation (Signed)
Anesthesia Post Note  Patient: Jimmy Duran  Procedure(s) Performed: COLONOSCOPY WITH PROPOFOL (N/A ) ESOPHAGOGASTRODUODENOSCOPY (EGD) WITH PROPOFOL (N/A ) MALONEY DILATION (N/A ) BIOPSY POLYPECTOMY  Patient location during evaluation: PACU Anesthesia Type: General Level of consciousness: awake and alert and oriented Pain management: pain level controlled Vital Signs Assessment: post-procedure vital signs reviewed and stable Respiratory status: spontaneous breathing Cardiovascular status: blood pressure returned to baseline and stable Postop Assessment: no apparent nausea or vomiting Anesthetic complications: no     Last Vitals:  Vitals:   12/08/17 0642  BP: 129/77  Pulse: 76  Resp: 18  Temp: 36.8 C  SpO2: 93%    Last Pain:  Vitals:   12/08/17 0740  TempSrc:   PainSc: 0-No pain                 ,

## 2017-12-08 NOTE — Progress Notes (Signed)
Wife verbalized "I can't drive a stick shift, he is going to have to drive".  Advised wife and patient that patient driving would be considered driving while impaired. Patient and wife verbalized understanding.

## 2017-12-08 NOTE — Progress Notes (Signed)
Letta Kocher charge nurse taking patient home with his wife.

## 2017-12-08 NOTE — Op Note (Signed)
[facility] Patient Name: Jimmy Duran Procedure Date: 12/08/2017 7:39 AM MRN: [id number] Date of Birth: June 14, 1955 Attending MD: Jimmy Duran , MD CSN: [id number] Age: 62 Admit Type: Outpatient Procedure:                Upper GI endoscopy Indications:              Esophageal dysphagia, Dysphagia Providers:                Jimmy Richards, MD, Jimmy Duran. Jimmy Seller, RN,                            Jimmy Duran, Technician Referring MD:              Medicines:                Propofol per Anesthesia Complications:            No immediate complications. Estimated Blood Loss:     Estimated blood loss was minimal. Procedure:                Pre-Anesthesia Assessment:                           - Prior to the procedure, a History and Physical                            was performed, and patient medications and                            allergies were reviewed. The patient's tolerance of                            previous anesthesia was also reviewed. The risks                            and benefits of the procedure and the sedation                            options and risks were discussed with the patient.                            All questions were answered, and informed consent                            was obtained. Prior Anticoagulants: The patient has                            taken no previous anticoagulant or antiplatelet                            agents. ASA Grade Assessment: II - A patient with                            mild systemic disease. After reviewing the risks  and benefits, the patient was deemed in                            satisfactory condition to undergo the procedure.                           After obtaining informed consent, the endoscope was                            passed under direct vision. Throughout the                            procedure, the patient's blood pressure, pulse, and   oxygen saturations were monitored continuously. The                            GIF-H190 ([id number]) scope was introduced through the                            and advanced to the second part of duodenum. The                            upper GI endoscopy was accomplished without                            difficulty. The patient tolerated the procedure                            well. Scope In: 7:43:17 AM Scope Out: 7:52:28 AM Total Procedure Duration: 0 hours 9 minutes 11 seconds  Findings:      Diffuse longitudinal furring of the tubular esophagus. Some friability.       No plaquing. Tubular esophagus appeared patent throughout its course.      A small hiatal hernia was present.      The exam was otherwise without abnormality.      The duodenal bulb and second portion of the duodenum were normal. The       scope was withdrawn. Dilation was performed with a Maloney dilator with       mild resistance at 50 Fr. The scope was withdrawn. Dilation was       performed with a Maloney dilator with no resistance at 56 Fr. The       dilation site was examined following endoscope reinsertion and showed       moderate mucosal disruption. Estimated blood loss was minimal. Impression:               -Esophagitis?query EOE. Dilated /biopsied.                           - Small hiatal hernia.                           - The stomach was otherwise normal.                           - Normal duodenal bulb and second portion of the  duodenum.                           - No specimens collected. Moderate Sedation:      Moderate (conscious) sedation was personally administered by an       anesthesia professional. The following parameters were monitored: oxygen       saturation, heart rate, blood pressure, respiratory rate, EKG, adequacy       of pulmonary ventilation, and response to care. Recommendation:           - Patient has a contact number available for                             emergencies. The signs and symptoms of potential                            delayed complications were discussed with the                            patient. Return to normal activities tomorrow.                            Written discharge instructions were provided to the                            patient.                           - Advance diet as tolerated.                           - Continue present medications.                           - No repeat upper endoscopy.                           - Return to GI office in 3 months. See colonoscopy                            report. Procedure Code(s):        --- Professional ---                           [phone], Esophagogastroduodenoscopy, flexible,                            transoral; diagnostic, including collection of                            specimen(s) by brushing or washing, when performed                            (separate procedure)                           76283, Dilation of esophagus, by unguided sound or  bougie, single or multiple passes Diagnosis Code(s):        --- Professional ---                           K22.8, Other specified diseases of esophagus                           K44.9, Diaphragmatic hernia without obstruction or                            gangrene                           R13.14, Dysphagia, pharyngoesophageal phase CPT copyright 2017 [employer]. All rights reserved. The codes documented in this report are preliminary and upon coder review may  be revised to meet current compliance requirements. Jimmy Duran. , MD Jimmy Richards, MD 12/08/2017 8:26:08 AM This report has been signed electronically. Number of Addenda: 0

## 2017-12-08 NOTE — Discharge Instructions (Signed)
°Colonoscopy °Discharge Instructions ° °Read the instructions outlined below and refer to this sheet in the next few weeks. These discharge instructions provide you with general information on caring for yourself after you leave the hospital. Your doctor may also give you specific instructions. While your treatment has been planned according to the most current medical practices available, unavoidable complications occasionally occur. If you have any problems or questions after discharge, call Dr. Rourk at [phone]. °ACTIVITY °· You may resume your regular activity, but move at a slower pace for the next 24 hours.  °· Take frequent rest periods for the next 24 hours.  °· Walking will help get rid of the air and reduce the bloated feeling in your belly (abdomen).  °· No driving for 24 hours (because of the medicine (anesthesia) used during the test).   °· Do not sign any important legal documents or operate any machinery for 24 hours (because of the anesthesia used during the test).  °NUTRITION °· Drink plenty of fluids.  °· You may resume your normal diet as instructed by your doctor.  °· Begin with a light meal and progress to your normal diet. Heavy or fried foods are harder to digest and may make you feel sick to your stomach (nauseated).  °· Avoid alcoholic beverages for 24 hours or as instructed.  °MEDICATIONS °· You may resume your normal medications unless your doctor tells you otherwise.  °WHAT YOU CAN EXPECT TODAY °· Some feelings of bloating in the abdomen.  °· Passage of more gas than usual.  °· Spotting of blood in your stool or on the toilet paper.  °IF YOU HAD POLYPS REMOVED DURING THE COLONOSCOPY: °· No aspirin products for 7 days or as instructed.  °· No alcohol for 7 days or as instructed.  °· Eat a soft diet for the next 24 hours.  °FINDING OUT THE RESULTS OF YOUR TEST °Not all test results are available during your visit. If your test results are not back during the visit, make an appointment  with your caregiver to find out the results. Do not assume everything is normal if you have not heard from your caregiver or the medical facility. It is important for you to follow up on all of your test results.  °SEEK IMMEDIATE MEDICAL ATTENTION IF: °· You have more than a spotting of blood in your stool.  °· Your belly is swollen (abdominal distention).  °· You are nauseated or vomiting.  °· You have a temperature over 101.  °· You have abdominal pain or discomfort that is severe or gets worse throughout the day.  °EGD °Discharge instructions °Please read the instructions outlined below and refer to this sheet in the next few weeks. These discharge instructions provide you with general information on caring for yourself after you leave the hospital. Your doctor may also give you specific instructions. While your treatment has been planned according to the most current medical practices available, unavoidable complications occasionally occur. If you have any problems or questions after discharge, please call your doctor. °ACTIVITY °· You may resume your regular activity but move at a slower pace for the next 24 hours.  °· Take frequent rest periods for the next 24 hours.  °· Walking will help expel (get rid of) the air and reduce the bloated feeling in your abdomen.  °· No driving for 24 hours (because of the anesthesia (medicine) used during the test).  °· You may shower.  °· Do not sign any important   legal documents or operate any machinery for 24 hours (because of the anesthesia used during the test).  NUTRITION  Drink plenty of fluids.   You may resume your normal diet.   Begin with a light meal and progress to your normal diet.   Avoid alcoholic beverages for 24 hours or as instructed by your caregiver.  MEDICATIONS  You may resume your normal medications unless your caregiver tells you otherwise.  WHAT YOU CAN EXPECT TODAY  You may experience abdominal discomfort such as a feeling of fullness  or gas pains.  FOLLOW-UP  Your doctor will discuss the results of your test with you.  SEEK IMMEDIATE MEDICAL ATTENTION IF ANY OF THE FOLLOWING OCCUR:  Excessive nausea (feeling sick to your stomach) and/or vomiting.   Severe abdominal pain and distention (swelling).   Trouble swallowing.   Temperature over 101 F (37.8 C).   Rectal bleeding or vomiting of blood.   Colon polyp and diverticulosis information provided  Further recommendations to follow pending review of pathology report  Office visit with [name] in 3 months      Colon Polyps Polyps are tissue growths inside the body. Polyps can grow in many places, including the large intestine (colon). A polyp may be a round bump or a mushroom-shaped growth. You could have one polyp or several. Most colon polyps are noncancerous (benign). However, some colon polyps can become cancerous over time. What are the causes? The exact cause of colon polyps is not known. What increases the risk? This condition is more likely to develop in people who:  Have a family history of colon cancer or colon polyps.  Are older than 25 or older than 45 if they are African American.  Have inflammatory bowel disease, such as ulcerative colitis or Crohn disease.  Are overweight.  Smoke cigarettes.  Do not get enough exercise.  Drink too much alcohol.  Eat a diet that is: ? High in fat and red meat. ? Low in fiber.  Had childhood cancer that was treated with abdominal radiation.  What are the signs or symptoms? Most polyps do not cause symptoms. If you have symptoms, they may include:  Blood coming from your rectum when having a bowel movement.  Blood in your stool.The stool may look dark red or black.  A change in bowel habits, such as constipation or diarrhea.  How is this diagnosed? This condition is diagnosed with a colonoscopy. This is a procedure that uses a lighted, flexible scope to look at the inside of your colon. How  is this treated? Treatment for this condition involves removing any polyps that are found. Those polyps will then be tested for cancer. If cancer is found, your health care provider will talk to you about options for colon cancer treatment. Follow these instructions at home: Diet  Eat plenty of fiber, such as fruits, vegetables, and whole grains.  Eat foods that are high in calcium and vitamin D, such as milk, cheese, yogurt, eggs, liver, fish, and broccoli.  Limit foods high in fat, red meats, and processed meats, such as hot dogs, sausage, bacon, and lunch meats.  Maintain a healthy weight, or lose weight if recommended by your health care provider. General instructions  Do not smoke cigarettes.  Do not drink alcohol excessively.  Keep all follow-up visits as told by your health care provider. This is important. This includes keeping regularly scheduled colonoscopies. Talk to your health care provider about when you need a colonoscopy.  Exercise every  day or as told by your health care provider. Contact a health care provider if:  You have new or worsening bleeding during a bowel movement.  You have new or increased blood in your stool.  You have a change in bowel habits.  You unexpectedly lose weight. This information is not intended to replace advice given to you by your health care provider. Make sure you discuss any questions you have with your health care provider. Document Released: 11/15/2003 Document Revised: 07/27/2015 Document Reviewed: 01/09/2015 Elsevier Interactive Patient Education  [employer].     Diverticulosis Diverticulosis is a condition that develops when small pouches (diverticula) form in the wall of the large intestine (colon). The colon is where water is absorbed and stool is formed. The pouches form when the inside layer of the colon pushes through weak spots in the outer layers of the colon. You may have a few pouches or many of them. What  are the causes? The cause of this condition is not known. What increases the risk? The following factors may make you more likely to develop this condition:  Being older than age 25. Your risk for this condition increases with age. Diverticulosis is rare among people younger than age 62. By age 23, many people have it.  Eating a low-fiber diet.  Having frequent constipation.  Being overweight.  Not getting enough exercise.  Smoking.  Taking over-the-counter pain medicines, like aspirin and ibuprofen.  Having a family history of diverticulosis.  What are the signs or symptoms? In most people, there are no symptoms of this condition. If you do have symptoms, they may include:  Bloating.  Cramps in the abdomen.  Constipation or diarrhea.  Pain in the lower left side of the abdomen.  How is this diagnosed? This condition is most often diagnosed during an exam for other colon problems. Because diverticulosis usually has no symptoms, it often cannot be diagnosed independently. This condition may be diagnosed by:  Using a flexible scope to examine the colon (colonoscopy).  Taking an X-ray of the colon after dye has been put into the colon (barium enema).  Doing a CT scan.  How is this treated? You may not need treatment for this condition if you have never developed an infection related to diverticulosis. If you have had an infection before, treatment may include:  Eating a high-fiber diet. This may include eating more fruits, vegetables, and grains.  Taking a fiber supplement.  Taking a live bacteria supplement (probiotic).  Taking medicine to relax your colon.  Taking antibiotic medicines.  Follow these instructions at home:  Drink 6-8 glasses of water or more each day to prevent constipation.  Try not to strain when you have a bowel movement.  If you have had an infection before: ? Eat more fiber as directed by your health care provider or your diet and  nutrition specialist (dietitian). ? Take a fiber supplement or probiotic, if your health care provider approves.  Take over-the-counter and prescription medicines only as told by your health care provider.  If you were prescribed an antibiotic, take it as told by your health care provider. Do not stop taking the antibiotic even if you start to feel better.  Keep all follow-up visits as told by your health care provider. This is important. Contact a health care provider if:  You have pain in your abdomen.  You have bloating.  You have cramps.  You have not had a bowel movement in 3 days. Get  help right away if:  Your pain gets worse.  Your bloating becomes very bad.  You have a fever or chills, and your symptoms suddenly get worse.  You vomit.  You have bowel movements that are bloody or black.  You have bleeding from your rectum. Summary  Diverticulosis is a condition that develops when small pouches (diverticula) form in the wall of the large intestine (colon).  You may have a few pouches or many of them.  This condition is most often diagnosed during an exam for other colon problems.  If you have had an infection related to diverticulosis, treatment may include increasing the fiber in your diet, taking supplements, or taking medicines. This information is not intended to replace advice given to you by your health care provider. Make sure you discuss any questions you have with your health care provider. Document Released: 11/16/2003 Document Revised: 01/08/2016 Document Reviewed: 01/08/2016 Elsevier Interactive Patient Education  2017 [place], Care After These instructions provide you with information about caring for yourself after your procedure. Your health care provider may also give you more specific instructions. Your treatment has been planned according to current medical practices, but problems sometimes occur. Call your  health care provider if you have any problems or questions after your procedure. What can I expect after the procedure? After your procedure, it is common to:  Feel sleepy for several hours.  Feel clumsy and have poor balance for several hours.  Feel forgetful about what happened after the procedure.  Have poor judgment for several hours.  Feel nauseous or vomit.  Have a sore throat if you had a breathing tube during the procedure.  Follow these instructions at home: For at least 24 hours after the procedure:   Do not: ? Participate in activities in which you could fall or become injured. ? Drive. ? Use heavy machinery. ? Drink alcohol. ? Take sleeping pills or medicines that cause drowsiness. ? Make important decisions or sign legal documents. ? Take care of children on your own.  Rest. Eating and drinking  Follow the diet that is recommended by your health care provider.  If you vomit, drink water, juice, or soup when you can drink without vomiting.  Make sure you have little or no nausea before eating solid foods. General instructions  Have a responsible adult stay with you until you are awake and alert.  Take over-the-counter and prescription medicines only as told by your health care provider.  If you smoke, do not smoke without supervision.  Keep all follow-up visits as told by your health care provider. This is important. Contact a health care provider if:  You keep feeling nauseous or you keep vomiting.  You feel light-headed.  You develop a rash.  You have a fever. Get help right away if:  You have trouble breathing. This information is not intended to replace advice given to you by your health care provider. Make sure you discuss any questions you have with your health care provider. Document Released: 06/11/2015 Document Revised: 10/11/2015 Document Reviewed: 06/11/2015 Elsevier Interactive Patient Education  [employer].

## 2017-12-08 NOTE — Op Note (Signed)
[facility] Patient Name: Jimmy Duran Procedure Date: 12/08/2017 7:56 AM MRN: [id number] Date of Birth: [birth year] Attending MD: Norvel Richards , MD CSN: [id number] Age: 62 Admit Type: Outpatient Procedure:                Colonoscopy Indications:              surveillance Providers:                Norvel Richards, MD, Jeanann Lewandowsky. Sharon Seller, RN,                            Nelma Rothman, Technician Referring MD:              Medicines:                Propofol per Anesthesia Complications:            No immediate complications. Estimated Blood Loss:     Estimated blood loss was minimal. Procedure:                Pre-Anesthesia Assessment:                           - Prior to the procedure, a History and Physical                            was performed, and patient medications and                            allergies were reviewed. The patient's tolerance of                            previous anesthesia was also reviewed. The risks                            and benefits of the procedure and the sedation                            options and risks were discussed with the patient.                            All questions were answered, and informed consent                            was obtained. Prior Anticoagulants: The patient has                            taken no previous anticoagulant or antiplatelet                            agents. ASA Grade Assessment: II - A patient with                            mild systemic disease. After reviewing the risks  and benefits, the patient was deemed in                            satisfactory condition to undergo the procedure.                           After obtaining informed consent, the colonoscope                            was passed under direct vision. Throughout the                            procedure, the patient's blood pressure, pulse, and                            oxygen saturations were  monitored continuously. The                            CF-[id number] ([id number]) scope was introduced through                            the anus and advanced to the the cecum, identified                            by appendiceal orifice and ileocecal valve. The                            colonoscopy was performed without difficulty. The                            patient tolerated the procedure well. The quality                            of the bowel preparation was adequate. The                            ileocecal valve, appendiceal orifice, and rectum                            were photographed. The entire colon was well                            visualized. Scope In: 8:00:41 AM Scope Out: 8:13:20 AM Scope Withdrawal Time: 0 hours 10 minutes 11 seconds  Total Procedure Duration: 0 hours 12 minutes 39 seconds  Findings:      The perianal and digital rectal examinations were normal.      Scattered small and large-mouthed diverticula were found in the sigmoid       colon and descending colon.      A polyp (81mm) was found in the transverse colon. The polyp was sessile.       The polyp was removed with a cold snare. Resection and retrieval were       complete. Estimated blood loss was minimal. Impression:               -  Diverticulosis in the sigmoid colon and in the                            descending colon.                           - One polyp in the transverse colon, removed with a                            cold snare. Resected and retrieved. Moderate Sedation:      Moderate (conscious) sedation was personally administered by an       anesthesia professional. The following parameters were monitored: oxygen       saturation, heart rate, blood pressure, respiratory rate, EKG, adequacy       of pulmonary ventilation, and response to care. Recommendation:           - Patient has a contact number available for                            emergencies. The signs and symptoms of potential                             delayed complications were discussed with the                            patient. Return to normal activities tomorrow.                            Written discharge instructions were provided to the                            patient.                           - Resume previous diet.                           - Continue present medications.                           - Repeat colonoscopy date to be determined after                            pending pathology results are reviewed for                            surveillance based on pathology results.                           - Return to GI clinic in 3 months. Procedure Code(s):        --- Professional ---                           [phone], Colonoscopy, flexible; with removal of  tumor(s), polyp(s), or other lesion(s) by snare                            technique Diagnosis Code(s):        --- Professional ---                           Z12.11, Encounter for screening for malignant                            neoplasm of colon                           Z80.0, Family history of malignant neoplasm of                            digestive organs                           Z86.010, Personal history of colonic polyps                           D12.3, Benign neoplasm of transverse colon (hepatic                            flexure or splenic flexure)                           K57.30, Diverticulosis of large intestine without                            perforation or abscess without bleeding CPT copyright 2017 American Medical Association. All rights reserved. The codes documented in this report are preliminary and upon coder review may  be revised to meet current compliance requirements. [name]. , MD Norvel Richards, MD 12/08/2017 8:32:44 AM This report has been signed electronically. Number of Addenda: 0

## 2017-12-08 NOTE — Progress Notes (Signed)
Wife states "no one to come get [name], they are all working, I can't drive a stick shift".  Cleo Mudlogger notified.

## 2017-12-08 NOTE — Progress Notes (Signed)
Wife Estill Bamberg verbalized "he is staying with me today in [place]. Patient verbalized he "lives in [place]".  Patient verbalizes that "I can get someone to come get my car tomorrow"

## 2017-12-08 NOTE — H&P (Addendum)
@LOGO @   Primary Care Physician:  Gara Kroner, DO Primary Gastroenterologist:  Dr. Gala Romney  Pre-Procedure History & Physical: HPI:  Jimmy Duran is a 62 y.o. male here for surveillance colonoscopy -history of colonic adenoma. and EGD for dysphagia - hx of Schatzki's ring.   Past Medical History:  Diagnosis Date  . AAA (abdominal aortic aneurysm) ([place])   . Arthritis   . Carotid artery disease ([place])   . Colon polyp   . COPD (chronic obstructive pulmonary disease) ([place])   . Coronary artery disease    Nonobstructive  . Diabetes mellitus   . Dyspnea   . Dysrhythmia   . Essential hypertension, benign   . GERD (gastroesophageal reflux disease)   . Hemorrhoids   . History of orthopnea   . Hypertension   . Low back pain   . Lumbar radiculopathy   . Mixed hyperlipidemia   . Peripheral vascular disease ([place])   . Stroke [facility])    "ministrokes"-memory deficits  . Type 2 diabetes mellitus ([place])     Past Surgical History:  Procedure Laterality Date  . APPENDECTOMY  1970  . BREAST SURGERY Right    Cyst resection on the right  . CATARACT EXTRACTION W/PHACO Right 06/18/2016   Procedure: CATARACT EXTRACTION PHACO AND INTRAOCULAR LENS PLACEMENT (IOC);  Surgeon: [name], MD;  Location: [facility] ORS;  Service: Ophthalmology;  Laterality: Right;  [name] 35.7 AP% 16.6 CDE 5.94 Fluid pack lot # [id number]  . CATARACT EXTRACTION W/PHACO Left 07/16/2016   Procedure: CATARACT EXTRACTION PHACO AND INTRAOCULAR LENS PLACEMENT (IOC);  Surgeon: [name], MD;  Location: [facility] ORS;  Service: Ophthalmology;  Laterality: Left;  [name] 51.8 AP% 12.7 CDE 6.58 Fluid Pack Lot # [id number]  . CHOLECYSTECTOMY  1993  . COLONOSCOPY  02/12/2007   VHQ:IONGEXBMW friable anal canal hemorrhoids, otherwise normal rectum and colon  . COLONOSCOPY N/A 07/16/2012   RMR: Colonic diverticulosis. 4 mm tubular adenoma  . ESOPHAGOGASTRODUODENOSCOPY (EGD) WITH ESOPHAGEAL DILATION N/A 07/16/2012   RMR: +Candida  esophagitis, Erosive reflux esophagiits. Schatizi's ring status post dilation. Hiatal hernia. Antral erosions status post biopsy.   Marland Kitchen HERNIA REPAIR    . JOINT REPLACEMENT    . KNEE SURGERY     Left knee arthroscopy torn medial meniscus grade 4 chondral changes medial femoral condyle tibial plateau  . TOTAL KNEE ARTHROPLASTY Left 04/27/2012   Procedure: TOTAL KNEE ARTHROPLASTY;  Surgeon: Carole Civil, MD;  Location: [facility]S;  Service: Orthopedics;  Laterality: Left;  . [place]    Prior to Admission medications   Medication Sig Start Date End Date Taking? Authorizing Provider  amLODipine (NORVASC) 10 MG tablet Take 10 mg by mouth daily.   Yes [provider]  aspirin EC 81 MG tablet Take 81 mg by mouth daily.   Yes [provider]  Aspirin-Salicylamide-Caffeine (BC HEADACHE POWDER PO) Take 1 Package by mouth daily as needed (headache).   Yes [provider]  busPIRone (BUSPAR) 7.5 MG tablet Take 7.5 mg by mouth 2 (two) times daily.  09/22/17  Yes [provider]  fluticasone furoate-vilanterol (BREO ELLIPTA) 100-25 MCG/INH AEPB Inhale 1 puff into the lungs daily.    Yes [provider]  losartan (COZAAR) 100 MG tablet Take 100 mg by mouth daily.   Yes [provider]  metFORMIN (GLUCOPHAGE) 1000 MG tablet Take 1,000 mg by mouth 2 (two) times daily with a meal.   Yes [provider]  pantoprazole (PROTONIX) 40 MG  tablet Take 1 tablet (40 mg total) by mouth daily. Patient taking differently: Take 40 mg by mouth 2 (two) times daily.  11/05/17  Yes Annitta Needs, NP  PARoxetine (PAXIL) 20 MG tablet Take 20 mg by mouth daily.  10/27/17  Yes [provider]  rosuvastatin (CRESTOR) 40 MG tablet Take 40 mg by mouth at bedtime. 12/16/16  Yes [provider]  sitaGLIPtin (JANUVIA) 50 MG tablet Take 50 mg by mouth 2 (two) times daily.   Yes [provider]    Allergies as of 11/05/2017  . (No  Known Allergies)    Family History  Problem Relation Age of Onset  . Colon polyps Mother   . Coronary artery disease Father        CABG in his 24's  . Colon cancer Other        maternal great aunt  . Colon cancer Other        paternal great aunt    Social History   Socioeconomic History  . Marital status: Married    Spouse name: Not on file  . Number of children: Not on file  . Years of education: Not on file  . Highest education level: Not on file  Occupational History  . Occupation: Retail banker: [employer]  . Financial resource strain: Not on file  . Food insecurity:    Worry: Not on file    Inability: Not on file  . Transportation needs:    Medical: Not on file    Non-medical: Not on file  Tobacco Use  . Smoking status: Current Every Day Smoker    Packs/day: 1.00    Years: 30.00    Pack years: 30.00    Types: Cigarettes  . Smokeless tobacco: Current User    Types: Chew  . Tobacco comment: 1 pk per day.   Substance and Sexual Activity  . Alcohol use: Yes    Alcohol/week: 0.0 standard drinks    Comment: socially "beer and tomato juice"   . Drug use: No  . Sexual activity: Yes    Birth control/protection: None  Lifestyle  . Physical activity:    Days per week: Not on file    Minutes per session: Not on file  . Stress: Not on file  Relationships  . Social connections:    Talks on phone: Not on file    Gets together: Not on file    Attends religious service: Not on file    Active member of club or organization: Not on file    Attends meetings of clubs or organizations: Not on file    Relationship status: Not on file  . Intimate partner violence:    Fear of current or ex partner: Not on file    Emotionally abused: Not on file    Physically abused: Not on file    Forced sexual activity: Not on file  Other Topics Concern  . Not on file  Social History Narrative  . Not on file    Review of Systems: See HPI,  otherwise negative ROS  Physical Exam: BP 129/77   Pulse 76   Temp 98.3 F (36.8 C) (Oral)   Resp 18   Ht 5\' 7"  (1.702 m)   Wt 69.4 kg   SpO2 93%   BMI 23.96 kg/m  General:   Alert,  Well-developed, well-nourished, pleasant and cooperative in NAD Neck:  Supple; no masses or thyromegaly. No significant cervical adenopathy.  Lungs:  Clear throughout to auscultation.   No wheezes, crackles, or rhonchi. No acute distress. Heart:  Regular rate and rhythm; no murmurs, clicks, rubs,  or gallops. Abdomen: Non-distended, normal bowel sounds.  Soft and nontender without appreciable mass or hepatosplenomegaly.  Pulses:  Normal pulses noted. Extremities:  Without clubbing or edema.  Impression/Plan: 62 year old gentleman here for surveillance colonoscopy-  Hx of colonic adenoma history of colonic adenoma and recurrent esophageal dysphagia.  History of Schatzki's ring.  The risks, benefits, limitations, imponderables and alternatives regarding both EGD and colonoscopy have been reviewed with the patient. Questions have been answered. All parties agreeable.      Notice: This dictation was prepared with Dragon dictation along with smaller phrase technology. Any transcriptional errors that result from this process are unintentional and may not be corrected upon review.

## 2017-12-08 NOTE — Anesthesia Preprocedure Evaluation (Addendum)
Anesthesia Evaluation  Patient identified by MRN, date of birth, ID band Patient awake    Reviewed: Allergy & Precautions, NPO status , Patient's Chart, lab work & pertinent test results  Airway Mallampati: II  TM Distance: >3 FB Neck ROM: Full    Dental no notable dental hx. (+) Edentulous Upper, Edentulous Lower   Pulmonary neg pulmonary ROS, shortness of breath, COPD, Current Smoker,  Uses Breo -denies rescue inhaler use   Pulmonary exam normal breath sounds clear to auscultation       Cardiovascular Exercise Tolerance: Good hypertension, Pt. on medications + CAD, + Peripheral Vascular Disease and + Orthopnea  negative cardio ROS Normal cardiovascular examI Rhythm:Regular Rate:Normal     Neuro/Psych States last tia in 2017 - none since  TIA Neuromuscular disease negative neurological ROS  negative psych ROS   GI/Hepatic negative GI ROS, Neg liver ROS, GERD  Medicated and Controlled,  Endo/Other  negative endocrine ROSdiabetes, Type 2  Renal/GU negative Renal ROS  negative genitourinary   Musculoskeletal negative musculoskeletal ROS (+) Arthritis , Osteoarthritis,    Abdominal   Peds negative pediatric ROS (+)  Hematology negative hematology ROS (+)   Anesthesia Other Findings   Reproductive/Obstetrics negative OB ROS                            Anesthesia Physical Anesthesia Plan  ASA: III  Anesthesia Plan: General   Post-op Pain Management:    Induction: Intravenous  PONV Risk Score and Plan:   Airway Management Planned: Simple Face Mask and Nasal Cannula  Additional Equipment:   Intra-op Plan:   Post-operative Plan:   Informed Consent: I have reviewed the patients History and Physical, chart, labs and discussed the procedure including the risks, benefits and alternatives for the proposed anesthesia with the patient or authorized representative who has indicated his/her  understanding and acceptance.   Dental advisory given  Plan Discussed with: CRNA  Anesthesia Plan Comments:         Anesthesia Quick Evaluation

## 2017-12-08 NOTE — Transfer of Care (Signed)
Immediate Anesthesia Transfer of Care Note  Patient: Jimmy Duran [facility]  Procedure(s) Performed: COLONOSCOPY WITH PROPOFOL (N/A ) ESOPHAGOGASTRODUODENOSCOPY (EGD) WITH PROPOFOL (N/A ) MALONEY DILATION (N/A ) BIOPSY POLYPECTOMY  Patient Location: PACU  Anesthesia Type:General  Level of Consciousness: awake  Airway & Oxygen Therapy: Patient Spontanous Breathing  Post-op Assessment: Report given to RN  Post vital signs: Reviewed and stable  Last Vitals:  Vitals Value Taken Time  BP    Temp    Pulse 74 12/08/2017  8:20 AM  Resp    SpO2 93 % 12/08/2017  8:20 AM  Vitals shown include unvalidated device data.  Last Pain:  Vitals:   12/08/17 0740  TempSrc:   PainSc: 0-No pain      Patients Stated Pain Goal: 5 (06/15/62 3837)  Complications: No apparent anesthesia complications

## 2017-12-11 ENCOUNTER — Encounter (HOSPITAL_COMMUNITY): Payer: Self-pay | Admitting: Internal Medicine

## 2018-01-01 ENCOUNTER — Encounter: Payer: Medicaid Other | Admitting: Gastroenterology

## 2018-01-28 ENCOUNTER — Emergency Department (HOSPITAL_COMMUNITY): Payer: Self-pay

## 2018-01-28 ENCOUNTER — Encounter (HOSPITAL_COMMUNITY): Payer: Self-pay

## 2018-01-28 ENCOUNTER — Emergency Department (HOSPITAL_COMMUNITY)
Admission: EM | Admit: 2018-01-28 | Discharge: 2018-01-28 | Disposition: A | Discharge status: Home / Self Care | Payer: Self-pay | Attending: Emergency Medicine | Admitting: Emergency Medicine

## 2018-01-28 ENCOUNTER — Other Ambulatory Visit: Payer: Self-pay

## 2018-01-28 DIAGNOSIS — Z7984 Long term (current) use of oral hypoglycemic drugs: Secondary | ICD-10-CM | POA: Insufficient documentation

## 2018-01-28 DIAGNOSIS — I10 Essential (primary) hypertension: Secondary | ICD-10-CM | POA: Insufficient documentation

## 2018-01-28 DIAGNOSIS — R748 Abnormal levels of other serum enzymes: Secondary | ICD-10-CM | POA: Insufficient documentation

## 2018-01-28 DIAGNOSIS — Z79899 Other long term (current) drug therapy: Secondary | ICD-10-CM | POA: Insufficient documentation

## 2018-01-28 DIAGNOSIS — E119 Type 2 diabetes mellitus without complications: Secondary | ICD-10-CM | POA: Insufficient documentation

## 2018-01-28 DIAGNOSIS — F17210 Nicotine dependence, cigarettes, uncomplicated: Secondary | ICD-10-CM | POA: Insufficient documentation

## 2018-01-28 DIAGNOSIS — Z7982 Long term (current) use of aspirin: Secondary | ICD-10-CM | POA: Insufficient documentation

## 2018-01-28 DIAGNOSIS — I259 Chronic ischemic heart disease, unspecified: Secondary | ICD-10-CM | POA: Insufficient documentation

## 2018-01-28 DIAGNOSIS — J449 Chronic obstructive pulmonary disease, unspecified: Secondary | ICD-10-CM | POA: Insufficient documentation

## 2018-01-28 LAB — COMPREHENSIVE METABOLIC PANEL
ALT: 25 U/L (ref 0–44)
AST: 21 U/L (ref 15–41)
Albumin: 4.2 g/dL (ref 3.5–5.0)
Alkaline Phosphatase: 65 U/L (ref 38–126)
Anion gap: 12 (ref 5–15)
BUN: 18 mg/dL (ref 6–20)
CO2: 20 mmol/L — ABNORMAL LOW (ref 22–32)
Calcium: 9 mg/dL (ref 8.9–10.3)
Chloride: 109 mmol/L (ref 98–111)
Creatinine, Ser: 1.21 mg/dL (ref 0.61–1.24)
GFR calc Af Amer: >60 mL/min (ref >60)
GFR calc non Af Amer: >60 mL/min (ref >60)
Glucose, Bld: 86 mg/dL (ref 70–99)
Potassium: 4.4 mmol/L (ref 3.5–5.1)
Sodium: 141 mmol/L (ref 135–145)
Total Bilirubin: 0.4 mg/dL (ref 0.3–1.2)
Total Protein: 7.6 g/dL (ref 6.5–8.1)

## 2018-01-28 LAB — URINALYSIS, ROUTINE W REFLEX MICROSCOPIC
Bacteria, UA: NONE SEEN
Bilirubin Urine: NEGATIVE
Glucose, UA: NEGATIVE mg/dL
Ketones, ur: NEGATIVE mg/dL
Leukocytes, UA: NEGATIVE
Nitrite: NEGATIVE
Protein, ur: NEGATIVE mg/dL
Specific Gravity, Urine: 1.012 (ref 1.005–1.030)
pH: 5 (ref 5.0–8.0)

## 2018-01-28 LAB — CBC WITH DIFFERENTIAL/PLATELET
Abs Immature Granulocytes: 0.03 10*3/uL (ref 0.00–0.07)
Basophils Absolute: 0.1 10*3/uL (ref 0.0–0.1)
Basophils Relative: 2 %
Eosinophils Absolute: 0.2 10*3/uL (ref 0.0–0.5)
Eosinophils Relative: 3 %
HCT: 44.7 % (ref 39.0–52.0)
Hemoglobin: 14.1 g/dL (ref 13.0–17.0)
Immature Granulocytes: 1 %
Lymphocytes Relative: 27 %
Lymphs Abs: 1.6 10*3/uL (ref 0.7–4.0)
MCH: 30.4 pg (ref 26.0–34.0)
MCHC: 31.5 g/dL (ref 30.0–36.0)
MCV: 96.3 fL (ref 80.0–100.0)
Monocytes Absolute: 0.6 10*3/uL (ref 0.1–1.0)
Monocytes Relative: 9 %
Neutro Abs: 3.6 10*3/uL (ref 1.7–7.7)
Neutrophils Relative %: 58 %
Platelets: 277 10*3/uL (ref 150–400)
RBC: 4.64 MIL/uL (ref 4.22–5.81)
RDW: 14.6 % (ref 11.5–15.5)
WBC: 6.1 10*3/uL (ref 4.0–10.5)
nRBC: 0 % (ref 0.0–0.2)

## 2018-01-28 LAB — TROPONIN I: Troponin I: <0.03 ng/mL (ref <0.03)

## 2018-01-28 LAB — CBG MONITORING, ED: Glucose-Capillary: 77 mg/dL (ref 70–99)

## 2018-01-28 LAB — MAGNESIUM: Magnesium: 2.5 mg/dL — ABNORMAL HIGH (ref 1.7–2.4)

## 2018-01-28 LAB — LIPASE, BLOOD: Lipase: 70 U/L — ABNORMAL HIGH (ref 11–51)

## 2018-01-28 MED ORDER — SODIUM CHLORIDE 0.9 % IV BOLUS
1000.0000 mL | Freq: Once | INTRAVENOUS | Status: AC
  Administered 2018-01-28: 1000 mL via INTRAVENOUS

## 2018-01-28 NOTE — ED Triage Notes (Signed)
Pt reports that he has been dizzy for 3 days and feels shaking. Pt reports some blurred vision. Denies CP Pt reports not as active as normal

## 2018-01-28 NOTE — ED Notes (Signed)
Pt c/o dizziness that became worse a few days ago, denies any associated events, denies anything that makes the dizziness better, denies any n/v,

## 2018-01-28 NOTE — ED Notes (Signed)
ED Provider at bedside. 

## 2018-01-28 NOTE — Discharge Instructions (Addendum)
Lipase which is a chemical associated with your pancreas gland is slightly elevated (70).  Additionally, we checked the following tests: CBC, chemistry panel, chest x-ray [which were all normal].  Urinalysis showed a small amount of hemoglobin.  You can show this information to your primary care doctor if symptoms worsen.  Increase fluids.

## 2018-01-28 NOTE — ED Notes (Signed)
Pt and family updated on plan of care, denies any needs at present time,

## 2018-01-29 NOTE — ED Provider Notes (Signed)
[facility] EMERGENCY DEPARTMENT Provider Note   CSN: [id number] Arrival date & time: 01/28/18  1044     History   Chief Complaint Chief Complaint  Patient presents with  . Dizziness    HPI Jimmy Duran is a 62 y.o. male.  Dizziness and lightheadedness for 3 days with associated sluggishness, weakness, questionable blurred vision.  Past medical history includes diabetes, hyperlipidemia, hypertension, COPD, AAA, CAD.  He is ambulatory without obvious deficits.  Severity of symptoms is moderate.  Nothing makes symptoms better or worse.  No substernal chest pain, dyspnea, fever, chills, dysuria, gross neurological deficits     Past Medical History:  Diagnosis Date  . AAA (abdominal aortic aneurysm) ([place])   . Arthritis   . Carotid artery disease ([place])   . Colon polyp   . COPD (chronic obstructive pulmonary disease) ([place])   . Coronary artery disease    Nonobstructive  . Diabetes mellitus   . Dyspnea   . Dysrhythmia   . Essential hypertension, benign   . GERD (gastroesophageal reflux disease)   . Hemorrhoids   . History of orthopnea   . Hypertension   . Low back pain   . Lumbar radiculopathy   . Mixed hyperlipidemia   . Peripheral vascular disease ([place])   . Stroke [facility])    "ministrokes"-memory deficits  . Type 2 diabetes mellitus [facility])     Patient Active Problem List   Diagnosis Date Noted  . History of colonic polyps 11/05/2017  . Diarrhea 08/15/2014  . Dizziness 01/04/2014  . Transient ischemic attack 01/04/2014  . Palpitations 04/22/2013  . Esophageal dysphagia 07/02/2012  . OA (osteoarthritis) of knee 06/17/2011  . GERD (gastroesophageal reflux disease) 05/04/2010  . Diabetes ([place]) 05/04/2010  . COPD (chronic obstructive pulmonary disease) ([place]) 05/04/2010  . Hemorrhoids 05/04/2010  . Mixed hyperlipidemia 04/25/2010  . Tobacco use disorder 04/25/2010  . Essential hypertension, benign 04/25/2010  . CORONARY ATHEROSCLEROSIS NATIVE CORONARY ARTERY  04/25/2010  . CAROTID ARTERY DISEASE 04/25/2010    Past Surgical History:  Procedure Laterality Date  . APPENDECTOMY  1970  . BIOPSY  12/08/2017   Procedure: BIOPSY;  Surgeon: [name], MD;  Location: [facility] ENDO [facility];  Service: Endoscopy;;  esophagus   . BREAST SURGERY Right    Cyst resection on the right  . CATARACT EXTRACTION W/PHACO Right 06/18/2016   Procedure: CATARACT EXTRACTION PHACO AND INTRAOCULAR LENS PLACEMENT (IOC);  Surgeon: [name], MD;  Location: [facility] ORS;  Service: Ophthalmology;  Laterality: Right;  [name] 35.7 AP% 16.6 CDE 5.94 Fluid pack lot # [id number]  . CATARACT EXTRACTION W/PHACO Left 07/16/2016   Procedure: CATARACT EXTRACTION PHACO AND INTRAOCULAR LENS PLACEMENT (IOC);  Surgeon: [name], MD;  Location: [facility] ORS;  Service: Ophthalmology;  Laterality: Left;  [name] 51.8 AP% 12.7 CDE 6.58 Fluid Pack Lot # [id number]  . CHOLECYSTECTOMY  1993  . COLONOSCOPY  02/12/2007   [facility]:ENIDPOEUM friable anal canal hemorrhoids, otherwise normal rectum and colon  . COLONOSCOPY N/A 07/16/2012   RMR: Colonic diverticulosis. 4 mm tubular adenoma  . COLONOSCOPY WITH PROPOFOL N/A 12/08/2017   Procedure: COLONOSCOPY WITH PROPOFOL;  Surgeon: [name], MD;  Location: [facility] ENDO SU[facility];  Service: Endoscopy;  Laterality: N/A;  7:30am  . ESOPHAGOGASTRODUODENOSCOPY (EGD) WITH ESOPHAGEAL DILATION N/A 07/16/2012   RMR: +Candida esophagitis, Erosive reflux esophagiits. Schatizi's ring status post dilation. Hiatal hernia. Antral erosions status post biopsy.   . ESOPHAGOGASTRODUODENOSCOPY (EGD) WITH PROPOFOL N/A 12/08/2017   Procedure: ESOPHAGOGASTRODUODENOSCOPY (EGD) WITH PROPOFOL;  Surgeon: [name], MD;  Location: [facility] END[facility];  Service: Endoscopy;  Laterality: N/A;  . HERNIA REPAIR    . JOINT REPLACEMENT    . KNEE SURGERY     Left knee arthroscopy torn medial meniscus grade 4 chondral changes medial femoral condyle tibial plateau  . MALONEY DILATION N/A 12/08/2017    Procedure: Venia Minks DILATION;  Surgeon: [name], MD;  Location: [facility] END[facility];  Service: Endoscopy;  Laterality: N/A;  . POLYPECTOMY  12/08/2017   Procedure: POLYPECTOMY;  Surgeon: [name], MD;  Location[facility];  Service: Endoscopy;;  colon  . TOTAL KNEE ARTHROPLASTY Left 04/27/2012   Procedure: TOTAL KNEE ARTHROPLASTY;  Surgeon: Carole Civil, MD;  Location: [facility] ORS;  Service: Orthopedics;  Laterality: Left;  . [place] Medications    Prior to Admission medications   Medication Sig Start Date End Date Taking? Authorizing Provider  amLODipine (NORVASC) 10 MG tablet Take 10 mg by mouth daily.   Yes [provider]  aspirin EC 81 MG tablet Take 81 mg by mouth daily.   Yes [provider]  Aspirin-Salicylamide-Caffeine (BC HEADACHE POWDER PO) Take 1 Package by mouth daily as needed (headache).   Yes [provider]  busPIRone (BUSPAR) 7.5 MG tablet Take 7.5 mg by mouth 2 (two) times daily.  09/22/17  Yes [provider]  fluticasone furoate-vilanterol (BREO ELLIPTA) 100-25 MCG/INH AEPB Inhale 1 puff into the lungs daily.    Yes [provider]  losartan (COZAAR) 100 MG tablet Take 100 mg by mouth daily.   Yes [provider]  metFORMIN (GLUCOPHAGE) 1000 MG tablet Take 1,000 mg by mouth 2 (two) times daily with a meal.   Yes [provider]  pantoprazole (PROTONIX) 40 MG tablet Take 1 tablet (40 mg total) by mouth daily. Patient taking differently: Take 40 mg by mouth 2 (two) times daily.  11/05/17  Yes Annitta Needs, NP  PARoxetine (PAXIL) 20 MG tablet Take 40 mg by mouth daily.  10/27/17  Yes [provider]  rosuvastatin (CRESTOR) 40 MG tablet Take 40 mg by mouth at bedtime. 12/16/16  Yes [provider]  sitaGLIPtin (JANUVIA) 50 MG tablet Take 50 mg by mouth 2 (two) times daily.   Yes [provider]    Family History Family History  Problem  Relation Age of Onset  . Colon polyps Mother   . Coronary artery disease Father        CABG in his 16's  . Colon cancer Other        maternal great aunt  . Colon cancer Other        paternal great aunt    Social History Social History   Tobacco Use  . Smoking status: Current Every Day Smoker    Packs/day: 1.00    Years: 30.00    Pack years: 30.00    Types: Cigarettes  . Smokeless tobacco: Current User    Types: Chew  . Tobacco comment: 1 pk per day.   Substance Use Topics  . Alcohol use: Yes    Alcohol/week: 0.0 standard drinks    Comment: socially "beer and tomato juice"   . Drug use: No     Allergies   Patient has no known allergies.   Review of Systems Review of Systems  All other systems reviewed and are negative.    Physical Exam Updated Vital Signs BP  121/74   Pulse 90   Temp 98.2 F (36.8 C) (Oral)   Resp 16   Ht 5\' 7"  (1.702 m)   Wt 69.4 kg   SpO2 95%   BMI 23.96 kg/m   Physical Exam  Constitutional: He is oriented to person, place, and time. He appears well-developed and well-nourished.  Alert, no acute distress  HENT:  Head: Normocephalic and atraumatic.  Eyes: Conjunctivae are normal.  Neck: Neck supple.  Cardiovascular: Normal rate and regular rhythm.  Pulmonary/Chest: Effort normal and breath sounds normal.  Abdominal: Soft. Bowel sounds are normal.  Musculoskeletal: Normal range of motion.  Neurological: He is alert and oriented to person, place, and time.  Skin: Skin is warm and dry.  Psychiatric: He has a normal mood and affect. His behavior is normal.  Nursing note and vitals reviewed.    ED Treatments / Results  Labs (all labs ordered are listed, but only abnormal results are displayed) Labs Reviewed  COMPREHENSIVE METABOLIC PANEL - Abnormal; Notable for the following components:      Result Value   CO2 20 (*)    All other components within normal limits  LIPASE, BLOOD - Abnormal; Notable for the following components:    Lipase 70 (*)    All other components within normal limits  URINALYSIS, ROUTINE W REFLEX MICROSCOPIC - Abnormal; Notable for the following components:   Color, Urine STRAW (*)    Hgb urine dipstick SMALL (*)    All other components within normal limits  MAGNESIUM - Abnormal; Notable for the following components:   Magnesium 2.5 (*)    All other components within normal limits  CBC WITH DIFFERENTIAL/PLATELET  TROPONIN I  CBG MONITORING, ED    EKG EKG Interpretation  Date/Time:  Wednesday January 28 2018 11:07:36 EST Ventricular Rate:  102 PR Interval:  152 QRS Duration: 64 QT Interval:  332 QTC Calculation: 432 R Axis:   -4 Text Interpretation:  Sinus tachycardia Otherwise normal ECG Confirmed by Nat Christen [phone]) on 01/28/2018 12:47:46 PM   Radiology Dg Chest 2 View  Result Date: 01/28/2018 CLINICAL DATA:  Weakness.  Hypertension. EXAM: CHEST - 2 VIEW COMPARISON:  Chest radiograph October 10, 2015 and chest CT August 05, 2016 FINDINGS: There is no edema or consolidation. The heart size and pulmonary vascularity are normal. No adenopathy. No bone lesions. No pneumothorax. IMPRESSION: No edema or consolidation.  Stable cardiac silhouette. Electronically Signed   By: Lowella Grip III M.D.   On: 01/28/2018 12:12    Procedures Procedures (including critical care time)  Medications Ordered in ED Medications  sodium chloride 0.9 % bolus 1,000 mL (0 mLs Intravenous Stopped 01/28/18 1315)     Initial Impression / Assessment and Plan / ED Course  I have reviewed the triage vital signs and the nursing notes.  Pertinent labs & imaging results that were available during my care of the patient were reviewed by me and considered in my medical decision making (see chart for details).     Patient presents with lightheadedness, weakness, general malaise.  He is alert and oriented x3 without any obvious neurological deficits.  Screening tests including EKG, screening labs all  acceptable with the exception of a lipase of 70.  He feels much better after IV fluids.  Discussed elevated lipase with the patient and his wife.  He will follow-up with his primary care doctor.  Final Clinical Impressions(s) / ED Diagnoses   Final diagnoses:  Elevated lipase    ED Discharge  Orders    None       Nat Christen, MD 01/29/18 [phone]

## 2018-02-23 ENCOUNTER — Encounter: Payer: Self-pay | Admitting: Internal Medicine

## 2018-04-09 ENCOUNTER — Encounter: Payer: Self-pay | Admitting: Internal Medicine

## 2018-04-10 ENCOUNTER — Encounter: Payer: Self-pay | Admitting: Internal Medicine

## 2018-06-18 ENCOUNTER — Ambulatory Visit (INDEPENDENT_AMBULATORY_CARE_PROVIDER_SITE_OTHER): Payer: Medicare HMO | Admitting: Gastroenterology

## 2018-06-18 ENCOUNTER — Other Ambulatory Visit: Payer: Self-pay

## 2018-06-18 ENCOUNTER — Encounter: Payer: Self-pay | Admitting: Gastroenterology

## 2018-06-18 DIAGNOSIS — R1032 Left lower quadrant pain: Secondary | ICD-10-CM | POA: Diagnosis not present

## 2018-06-18 DIAGNOSIS — K219 Gastro-esophageal reflux disease without esophagitis: Secondary | ICD-10-CM | POA: Diagnosis not present

## 2018-06-18 NOTE — Progress Notes (Signed)
Primary Care Physician:  Gara Kroner, DO  Primary GI: Dr. Gala Romney   Virtual Visit via Telephone Note Due to COVID-19, visit is conducted virtually and was requested by patient.   I connected with Jimmy Duran on 06/19/18 at 10:30 AM EDT by telephone and verified that I am speaking with the correct person using two identifiers.   I discussed the limitations, risks, security and privacy concerns of performing an evaluation and management service by telephone and the availability of in person appointments. I also discussed with the patient that there may be a patient responsible charge related to this service. The patient expressed understanding and agreed to proceed.  Chief Complaint  Patient presents with  . Abdominal Pain    "still gives me a fit" eats very little, LLQ pain at times can be stabby and then others dull, comes/goes  . Dysphagia    doing fine     History of Present Illness: 63 year old male presenting for phone visit following EGD/colonoscopy. History of LLQ discomfort intermittently. Chronic GERD. Dysphagia improved s/p dilation. Concern for EOE on endoscopy but negative path. However, rare yeast forms present on PAS stain. Denies dysphagia now and has noted improvement s/p dilatation. Has LLQ discomfort intermittently. Nothing precipitates. Not related to BM. First morning being active this morning. Sometimes hurts so bad can't get out of the bed. No fever or chills. This past time has been three days. Colonoscopy with diverticulosis.   No dysphagia. Appetite some days better than others. Overall eating very good. BM every 3-4 days. Sometimes feels constipation, sometimes doesn't.    Past Medical History:  Diagnosis Date  . AAA (abdominal aortic aneurysm) ([place])   . Arthritis   . Carotid artery disease ([place])   . Colon polyp   . COPD (chronic obstructive pulmonary disease) ([place])   . Coronary artery disease    Nonobstructive  . Diabetes mellitus   .  Dyspnea   . Dysrhythmia   . Essential hypertension, benign   . GERD (gastroesophageal reflux disease)   . Hemorrhoids   . History of orthopnea   . Hypertension   . Low back pain   . Lumbar radiculopathy   . Mixed hyperlipidemia   . Peripheral vascular disease ([place])   . Stroke [facility])    "ministrokes"-memory deficits  . Type 2 diabetes mellitus ([place])      Past Surgical History:  Procedure Laterality Date  . APPENDECTOMY  1970  . BIOPSY  12/08/2017   Procedure: BIOPSY;  Surgeon: [name], MD;  Location[facility];  Service: Endoscopy;;  esophagus   . BREAST SURGERY Right    Cyst resection on the right  . CATARACT EXTRACTION W/PHACO Right 06/18/2016   Procedure: CATARACT EXTRACTION PHACO AND INTRAOCULAR LENS PLACEMENT (IOC);  Surgeon: [name], MD;  Location: [facility] ORS;  Service: Ophthalmology;  Laterality: Right;  [name] 35.7 AP% 16.6 CDE 5.94 Fluid pack lot # [id number]  . CATARACT EXTRACTION W/PHACO Left 07/16/2016   Procedure: CATARACT EXTRACTION PHACO AND INTRAOCULAR LENS PLACEMENT (IOC);  Surgeon: [name], MD;  Location: [facility] ORS;  Service: Ophthalmology;  Laterality: Left;  [name] 51.8 AP% 12.7 CDE 6.58 Fluid Pack Lot # [id number]  . CHOLECYSTECTOMY  1993  . COLONOSCOPY  02/12/2007   [facility]:[id number] friable anal canal hemorrhoids, otherwise normal rectum and colon  . COLONOSCOPY N/A 07/16/2012   RMR: Colonic diverticulosis. 4 mm tubular adenoma  . COLONOSCOPY WITH PROPOFOL N/A 12/08/2017   Dr. Gala Romney: sigmoid  and descending colon diverticulosis, transverse colon polyp (TUBULAR ADENOMA)  . ESOPHAGOGASTRODUODENOSCOPY (EGD) WITH ESOPHAGEAL DILATION N/A 07/16/2012   RMR: +Candida esophagitis, Erosive reflux esophagiits. Schatizi's ring status post dilation. Hiatal hernia. Antral erosions status post biopsy.   . ESOPHAGOGASTRODUODENOSCOPY (EGD) WITH PROPOFOL N/A 12/08/2017   Dr. Gala Romney: esophagitis, query EOE but negative for increased eosinophils on path, small hiatal  hernia, normal stomach, normal duodenum, PAS stain with rare yeast forms on stain. Empiric dilation  . HERNIA REPAIR    . JOINT REPLACEMENT    . KNEE SURGERY     Left knee arthroscopy torn medial meniscus grade 4 chondral changes medial femoral condyle tibial plateau  . MALONEY DILATION N/A 12/08/2017   Procedure: Venia Minks DILATION;  Surgeon: [name], MD;  [facility];  Service: Endoscopy;  Laterality: N/A;  . POLYPECTOMY  12/08/2017   Procedure: POLYPECTOMY;  Surgeon: [name], MD;  Location[facility];  Service: Endoscopy;;  colon  . TOTAL KNEE ARTHROPLASTY Left 04/27/2012   Procedure: TOTAL KNEE ARTHROPLASTY;  Surgeon: Carole Civil, MD;  Location: [facility] ORS;  Service: Orthopedics;  Laterality: Left;  . UMBILICAL HERNIA REPAIR  1993     Current Meds  Medication Sig  . amLODipine (NORVASC) 10 MG tablet Take 10 mg by mouth daily.  Marland Kitchen aspirin EC 81 MG tablet Take 81 mg by mouth daily.  . Aspirin-Salicylamide-Caffeine (BC HEADACHE POWDER PO) Take 1 Package by mouth daily as needed (headache).  . busPIRone (BUSPAR) 7.5 MG tablet Take 7.5 mg by mouth 2 (two) times daily.   . fluticasone furoate-vilanterol (BREO ELLIPTA) 100-25 MCG/INH AEPB Inhale 1 puff into the lungs daily.   Marland Kitchen losartan (COZAAR) 100 MG tablet Take 100 mg by mouth daily.  . metFORMIN (GLUCOPHAGE) 1000 MG tablet Take 1,000 mg by mouth 2 (two) times daily with a meal.  . pantoprazole (PROTONIX) 40 MG tablet Take 1 tablet (40 mg total) by mouth daily. (Patient taking differently: Take 40 mg by mouth 2 (two) times daily. )  . PARoxetine (PAXIL) 20 MG tablet Take 40 mg by mouth daily.   . rosuvastatin (CRESTOR) 40 MG tablet Take 40 mg by mouth at bedtime.       Observations/Objective: No distress. Unable to perform physical exam due to telephone encounter. No video available.   Assessment and Plan: 63 year old male with chronic GERD, dysphagia resolved s/p empiric dilatation, no evidence for EOE  on EGD. LLQ pain intermittently without fever. Query underlying low-grade diverticulitis. I have asked him to call with any further pain and may need to treat empirically with short course of abx, as CT scan may not be feasible due to COVID restrictions unless pain is severe. I do think he would benefit from imaging at some point in the future, especially if recurrent episodes. Will also ensure bowel regimen is maximized. Add Benefiber to regimen. Continue PPI. He will return in 4 months or sooner as needed.   Follow Up Instructions: See AVS   I discussed the assessment and treatment plan with the patient. The patient was provided an opportunity to ask questions and all were answered. The patient agreed with the plan and demonstrated an understanding of the instructions.   The patient was advised to call back or seek an in-person evaluation if the symptoms worsen or if the condition fails to improve as anticipated.  I provided 15 minutes of non-face-to-face time during this encounter.  Annitta Needs, PhD, ANP-BC [facility]

## 2018-06-18 NOTE — Patient Instructions (Addendum)
Please call us if you have recurrent abdominal pain. I recommend a CT scan in the future. We may need to treat empirically for diverticulitis in the interim unless pain is severe. For now, let's focus on maximizing bowel regimen by adding Benefiber 1 teaspoon daily and increasing to three times a day as tolerated.  We will see you in 4 months!  I enjoyed seeing you again today! As you know, I value our relationship and want to provide genuine, compassionate, and quality care. I welcome your feedback. If you receive a survey regarding your visit,  I greatly appreciate you taking time to fill this out. See you next time!  Annitta Needs, PhD, ANP-BC [facility]

## 2018-06-19 ENCOUNTER — Encounter: Payer: Self-pay | Admitting: Gastroenterology

## 2018-06-19 ENCOUNTER — Encounter: Payer: Self-pay | Admitting: Internal Medicine

## 2018-06-22 NOTE — Progress Notes (Signed)
cc'ed to pcp °

## 2018-10-19 ENCOUNTER — Encounter: Payer: Self-pay | Admitting: Internal Medicine

## 2018-10-19 ENCOUNTER — Ambulatory Visit: Payer: Medicare HMO | Admitting: Gastroenterology

## 2018-10-19 ENCOUNTER — Telehealth: Payer: Self-pay | Admitting: Internal Medicine

## 2018-10-19 NOTE — Telephone Encounter (Signed)
PATIENT WAS A NO SHOW AND LETTER SENT  °

## 2018-10-19 NOTE — Telephone Encounter (Signed)
Noted  

## 2018-12-01 ENCOUNTER — Other Ambulatory Visit: Payer: Self-pay

## 2018-12-01 ENCOUNTER — Ambulatory Visit (INDEPENDENT_AMBULATORY_CARE_PROVIDER_SITE_OTHER): Payer: Medicare HMO | Admitting: Cardiology

## 2018-12-01 ENCOUNTER — Encounter: Payer: Self-pay | Admitting: Cardiology

## 2018-12-01 VITALS — BP 149/96 | HR 104 | Temp 97.1°F | Ht 67.0 in | Wt 160.0 lb

## 2018-12-01 DIAGNOSIS — E782 Mixed hyperlipidemia: Secondary | ICD-10-CM | POA: Diagnosis not present

## 2018-12-01 DIAGNOSIS — Z79899 Other long term (current) drug therapy: Secondary | ICD-10-CM | POA: Diagnosis not present

## 2018-12-01 DIAGNOSIS — I2510 Atherosclerotic heart disease of native coronary artery without angina pectoris: Secondary | ICD-10-CM

## 2018-12-01 DIAGNOSIS — I10 Essential (primary) hypertension: Secondary | ICD-10-CM

## 2018-12-01 DIAGNOSIS — I6523 Occlusion and stenosis of bilateral carotid arteries: Secondary | ICD-10-CM

## 2018-12-01 MED ORDER — METOPROLOL TARTRATE 25 MG PO TABS: 25.0000 mg | ORAL_TABLET | Freq: Two times a day (BID) | ORAL | 3 refills | Status: DC

## 2018-12-01 MED ORDER — LISINOPRIL-HYDROCHLOROTHIAZIDE 20-12.5 MG PO TABS: 1.0000 | ORAL_TABLET | Freq: Every day | ORAL | 3 refills | Status: DC

## 2018-12-01 NOTE — Patient Instructions (Addendum)
Medication Instructions:  STOP Lisinopril  START Prinzide 20/12.5 mg daily  START Lopressor 25 mg twice a day  Labwork: In 10 days, get BMET  Procedures/Testing: Your physician has requested that you have an echocardiogram. Echocardiography is a painless test that uses sound waves to create images of your heart. It provides your doctor with information about the size and shape of your heart and how well your heart's chambers and valves are working. This procedure takes approximately one hour. There are no restrictions for this procedure.    Follow-Up: 6-8 weeks with [name], PA_C  Any Additional Special Instructions Will Be Listed Below (If Applicable).    Thank you for choosing [facility] !            If you need a refill on your cardiac medications before your next appointment, please call your pharmacy.

## 2018-12-01 NOTE — Progress Notes (Signed)
Cardiology Office Note  Date: 12/01/2018   ID: Jimmy Duran, DOB September 06, 1955, MRN [id number]  PCP:  Gara Kroner, DO  Cardiologist:  [name], MD Electrophysiologist:  None   Chief Complaint  Patient presents with  . Cardiac follow-up    History of Present Illness: Jimmy Duran is a 63 y.o. male that I have not seen in the office since 2015. His last visit with our practice was in 2016.  Records indicate subsequent follow-up by Dr. Gennette Duran through the [facility] system as of July.  He is here today with significant other.  He tells me that he lost his insurance years ago, was on disability, had not had regular health care but is now seeing a PCP in [place].  He did have carotid Dopplers arranged after his visit with [facility], reviewed below, no other cardiac testing.  Medications have changed since his last visit with our practice.  He states that his blood pressure has not been as well controlled.  Systolics in the 0000000 today.  He reports having lab work with PCP earlier this year which we are requesting.  He is also still taking Zocor although this is not on his list.  I personally reviewed his ECG today which shows normal sinus rhythm.  Past Medical History:  Diagnosis Date  . AAA (abdominal aortic aneurysm) ([place])   . Arthritis   . Carotid artery disease ([place])   . Colon polyp   . COPD (chronic obstructive pulmonary disease) ([place])   . Coronary artery disease    Nonobstructive  . Dyspnea   . Dysrhythmia   . Essential hypertension   . GERD (gastroesophageal reflux disease)   . Hemorrhoids   . History of orthopnea   . Low back pain   . Lumbar radiculopathy   . Mixed hyperlipidemia   . Peripheral vascular disease ([place])   . Stroke [facility])    "ministrokes"-memory deficits  . Type 2 diabetes mellitus ([place])     Past Surgical History:  Procedure Laterality Date  . APPENDECTOMY  1970  . BIOPSY  12/08/2017   Procedure: BIOPSY;  Surgeon: [name], MD;   [facility];  Service: Endoscopy;;  esophagus   . BREAST SURGERY Right    Cyst resection on the right  . CATARACT EXTRACTION W/PHACO Right 06/18/2016   Procedure: CATARACT EXTRACTION PHACO AND INTRAOCULAR LENS PLACEMENT (IOC);  Surgeon: [name], MD;  Location: [facility] ORS;  Service: Ophthalmology;  Laterality: Right;  [name] 35.7 AP% 16.6 CDE 5.94 Fluid pack lot # [id number]  . CATARACT EXTRACTION W/PHACO Left 07/16/2016   Procedure: CATARACT EXTRACTION PHACO AND INTRAOCULAR LENS PLACEMENT (IOC);  Surgeon: [name], MD;  Location: [facility] ORS;  Service: Ophthalmology;  Laterality: Left;  [name] 51.8 AP% 12.7 CDE 6.58 Fluid Pack Lot # [id number]  . CHOLECYSTECTOMY  1993  . COLONOSCOPY  02/12/2007   [id number] friable anal canal hemorrhoids, otherwise normal rectum and colon  . COLONOSCOPY N/A 07/16/2012   RMR: Colonic diverticulosis. 4 mm tubular adenoma  . COLONOSCOPY WITH PROPOFOL N/A 12/08/2017   Dr. Gala Duran: sigmoid and descending colon diverticulosis, transverse colon polyp (TUBULAR ADENOMA)  . ESOPHAGOGASTRODUODENOSCOPY (EGD) WITH ESOPHAGEAL DILATION N/A 07/16/2012   RMR: +Candida esophagitis, Erosive reflux esophagiits. Schatizi's ring status post dilation. Hiatal hernia. Antral erosions status post biopsy.   . ESOPHAGOGASTRODUODENOSCOPY (EGD) WITH PROPOFOL N/A 12/08/2017   Dr. Gala Duran: esophagitis, query EOE but negative for increased eosinophils on path, small hiatal hernia, normal stomach, normal duodenum,  PAS stain with rare yeast forms on stain. Empiric dilation  . HERNIA REPAIR    . JOINT REPLACEMENT    . KNEE SURGERY     Left knee arthroscopy torn medial meniscus grade 4 chondral changes medial femoral condyle tibial plateau  . MALONEY DILATION N/A 12/08/2017   Procedure: Venia Minks DILATION;  Surgeon: [name], MD;  [facility];  Service: Endoscopy;  Laterality: N/A;  . POLYPECTOMY  12/08/2017   Procedure: POLYPECTOMY;  Surgeon: [name], MD;   [facility];  Service: Endoscopy;;  colon  . TOTAL KNEE ARTHROPLASTY Left 04/27/2012   Procedure: TOTAL KNEE ARTHROPLASTY;  Surgeon: Carole Civil, MD;  Location: [facility] ORS;  Service: Orthopedics;  Laterality: Left;  . UMBILICAL HERNIA REPAIR  1993    Current Outpatient Medications  Medication Sig Dispense Refill  . aspirin EC 81 MG tablet Take 81 mg by mouth daily.    . Aspirin-Salicylamide-Caffeine (BC HEADACHE POWDER PO) Take 1 Package by mouth daily as needed (headache).    Marland Kitchen buPROPion (WELLBUTRIN SR) 150 MG 12 hr tablet Take 150 mg by mouth 2 (two) times daily.    . metFORMIN (GLUCOPHAGE) 1000 MG tablet Take 1,000 mg by mouth 2 (two) times daily with a meal.    . NOVOLOG FLEXPEN 100 UNIT/ML FlexPen INJECT 3 UNITS SUBCUTANEOUSLY BEFORE EACH MEAL    . pantoprazole (PROTONIX) 40 MG tablet Take 1 tablet (40 mg total) by mouth daily. (Patient taking differently: Take 40 mg by mouth 2 (two) times daily. ) 60 tablet 3  . TOUJEO SOLOSTAR 300 UNIT/ML SOPN     . lisinopril-hydrochlorothiazide (ZESTORETIC) 20-12.5 MG tablet Take 1 tablet by mouth daily. 90 tablet 3  . metoprolol tartrate (LOPRESSOR) 25 MG tablet Take 1 tablet (25 mg total) by mouth 2 (two) times daily. 180 tablet 3   No current facility-administered medications for this visit.    Allergies:  Patient has no known allergies.   Social History: The patient  reports that he has been smoking cigarettes. He has a 30.00 pack-year smoking history. His smokeless tobacco use includes chew. He reports previous alcohol use. He reports that he does not use drugs.   Family History: The patient's family history includes Colon cancer in some other family members; Colon polyps in his mother; Coronary artery disease in his father.   ROS:  Please see the history of present illness. Otherwise, complete review of systems is positive for none.  All other systems are reviewed and negative.   Physical Exam: VS:  BP (!) 149/96   Pulse (!)  104   Temp (!) 97.1 F (36.2 C)   Ht 5\' 7"  (1.702 m)   Wt 160 lb (72.6 kg)   SpO2 96%   BMI 25.06 kg/m , BMI Body mass index is 25.06 kg/m.  Wt Readings from Last 3 Encounters:  12/01/18 160 lb (72.6 kg)  01/28/18 153 lb (69.4 kg)  12/08/17 153 lb (69.4 kg)    General: Patient appears comfortable at rest. HEENT: Conjunctiva and lids normal, wearing a mask. Neck: Supple, no elevated JVP or carotid bruits, no thyromegaly. Lungs: Clear to auscultation, nonlabored breathing at rest. Cardiac: Regular rate and rhythm, no S3 or significant systolic murmur, no pericardial rub. Abdomen: Soft, nontender, bowel sounds present. Extremities: No pitting edema, distal pulses 2+. Skin: Warm and dry. Musculoskeletal: No kyphosis. Neuropsychiatric: Alert and oriented x3, affect grossly appropriate.  ECG:  An ECG dated 01/28/2018 was personally reviewed today and  demonstrated:  Sinus tachycardia.  Recent Labwork: 01/28/2018: ALT 25; AST 21; BUN 18; Creatinine, Ser 1.21; Hemoglobin 14.1; Magnesium 2.5; Platelets 277; Potassium 4.4; Sodium 141   Other Studies Reviewed Today:  Echocardiogram 04/23/2013: Study Conclusions   - Study data: Technically adequate study.  - Left ventricle: The cavity size was normal. Wall thickness  was increased in a pattern of moderate LVH. Systolic  function was normal. The estimated ejection fraction was  in the range of 60% to 65%. Wall motion was normal; there  were no regional wall motion abnormalities. Left  ventricular diastolic function parameters were normal.  - Aortic valve: Mildly calcified annulus. Mildly thickened  leaflets. Valve area: 1.86cm^2(VTI). Valve area: 1.74cm^2  (Vmax).   Lexiscan Myoview 04/23/2013: IMPRESSION: 1.  Negative Lexi scan myocardial perfusion imaging stress test  2. Normal left ventricular ejection fraction with normal wall motion.  3.  Low risk study for major cardiovascular events.  Carotid Dopplers  08/19/2018: Final Interpretation Right Carotid: There is evidence in the ICA of a 40-59% stenosis. Plaque noted         in the ECA. Left Carotid: There is evidence in the ICA of a 40-59% stenosis.        Non-hemodynamically significant plaque noted in the CCA. Plaque        noted in the ECA. Vertebrals: Both vertebral arteries were patent with antegrade flow. Subclavians: Normal flow hemodynamics were seen in bilateral subclavian        arteries.  Assessment and Plan:  1.  Essential hypertension, systolic is in the 0000000 today.  Plan at this point is to go back to the previous regimen he was tolerating in 2016 including Lopressor 25 mg twice daily and Prinzide 20/12.5 mg daily.  Follow-up BMET in 7 to 10 days.  2.  History of nonobstructive coronary atherosclerosis, last ischemic assessment was in 2015.  He does not describe any obvious angina at this time with stable dyspnea exertion currently at NYHA class II.  Plan is to obtain an echocardiogram to ensure stability in LVEF compared to 2015.  Otherwise remain on aspirin and statin.  3.  Carotid artery disease, moderate by Dopplers in June.  Continue aspirin and statin.  We will plan a follow-up study next year.  4.  Mixed hyperlipidemia, reports that he is still taking Zocor.  Requesting interval lab work from PCP.  Medication Adjustments/Labs and Tests Ordered: Current medicines are reviewed at length with the patient today.  Concerns regarding medicines are outlined above.   Tests Ordered: Orders Placed This Encounter  Procedures  . Basic Metabolic Panel (BMET)  . EKG 12-Lead  . ECHOCARDIOGRAM COMPLETE    Medication Changes: Meds ordered this encounter  Medications  . metoprolol tartrate (LOPRESSOR) 25 MG tablet    Sig: Take 1 tablet (25 mg total) by mouth 2 (two) times daily.    Dispense:  180 tablet    Refill:  3  . lisinopril-hydrochlorothiazide (ZESTORETIC) 20-12.5 MG tablet    Sig: Take 1  tablet by mouth daily.    Dispense:  90 tablet    Refill:  3    12/01/18 lisinopril stopped    Disposition:  Follow up 6 weeks with [place].  Signed, Satira Sark, MD, [facility] 12/01/2018 10:16 AM    [place] [facility] [place]. [address], [place], [place] [address] Phone: [phone]; Fax: [phone]

## 2018-12-11 ENCOUNTER — Other Ambulatory Visit: Payer: Self-pay

## 2018-12-11 ENCOUNTER — Ambulatory Visit (HOSPITAL_COMMUNITY)
Admission: RE | Admit: 2018-12-11 | Discharge: 2018-12-11 | Disposition: A | Discharge status: Home / Self Care | Payer: Medicare HMO | Source: Ambulatory Visit | Attending: Cardiology | Admitting: Cardiology

## 2018-12-11 DIAGNOSIS — I2510 Atherosclerotic heart disease of native coronary artery without angina pectoris: Secondary | ICD-10-CM | POA: Insufficient documentation

## 2018-12-11 NOTE — Progress Notes (Signed)
*  PRELIMINARY RESULTS* Echocardiogram 2D Echocardiogram has been performed.  Jimmy Duran 12/11/2018, 9:01 AM

## 2018-12-15 ENCOUNTER — Other Ambulatory Visit (HOSPITAL_COMMUNITY)
Admission: RE | Admit: 2018-12-15 | Discharge: 2018-12-15 | Disposition: A | Discharge status: Home / Self Care | Payer: Medicare HMO | Source: Ambulatory Visit | Attending: Cardiology | Admitting: Cardiology

## 2018-12-15 ENCOUNTER — Other Ambulatory Visit: Payer: Self-pay

## 2018-12-15 DIAGNOSIS — Z79899 Other long term (current) drug therapy: Secondary | ICD-10-CM | POA: Insufficient documentation

## 2018-12-15 LAB — BASIC METABOLIC PANEL
Anion gap: 13 (ref 5–15)
BUN: 16 mg/dL (ref 8–23)
CO2: 27 mmol/L (ref 22–32)
Calcium: 9.6 mg/dL (ref 8.9–10.3)
Chloride: 94 mmol/L — ABNORMAL LOW (ref 98–111)
Creatinine, Ser: 1.14 mg/dL (ref 0.61–1.24)
GFR calc Af Amer: >60 mL/min (ref >60)
GFR calc non Af Amer: >60 mL/min (ref >60)
Glucose, Bld: 352 mg/dL — ABNORMAL HIGH (ref 70–99)
Potassium: 4.1 mmol/L (ref 3.5–5.1)
Sodium: 134 mmol/L — ABNORMAL LOW (ref 135–145)

## 2019-01-13 ENCOUNTER — Encounter: Payer: Self-pay | Admitting: Student

## 2019-01-13 ENCOUNTER — Telehealth (INDEPENDENT_AMBULATORY_CARE_PROVIDER_SITE_OTHER): Payer: Medicare HMO | Admitting: Student

## 2019-01-13 VITALS — BP 111/73 | HR 84 | Ht 67.0 in | Wt 160.0 lb

## 2019-01-13 DIAGNOSIS — I6523 Occlusion and stenosis of bilateral carotid arteries: Secondary | ICD-10-CM

## 2019-01-13 DIAGNOSIS — I2510 Atherosclerotic heart disease of native coronary artery without angina pectoris: Secondary | ICD-10-CM | POA: Diagnosis not present

## 2019-01-13 DIAGNOSIS — I739 Peripheral vascular disease, unspecified: Secondary | ICD-10-CM

## 2019-01-13 DIAGNOSIS — E785 Hyperlipidemia, unspecified: Secondary | ICD-10-CM

## 2019-01-13 DIAGNOSIS — Z720 Tobacco use: Secondary | ICD-10-CM

## 2019-01-13 DIAGNOSIS — I10 Essential (primary) hypertension: Secondary | ICD-10-CM

## 2019-01-13 NOTE — Progress Notes (Signed)
Virtual Visit via Telephone Note   This visit type was conducted due to national recommendations for restrictions regarding the COVID-19 Pandemic (e.g. social distancing) in an effort to limit this patient's exposure and mitigate transmission in our community.  Due to his co-morbid illnesses, this patient is at least at moderate risk for complications without adequate follow up.  This format is felt to be most appropriate for this patient at this time.  The patient did not have access to video technology/had technical difficulties with video requiring transitioning to audio format only (telephone).  All issues noted in this document were discussed and addressed.  No physical exam could be performed with this format.  Please refer to the patient's chart for his  consent to telehealth for [facility]   Date:  01/13/2019   ID:  Jimmy Duran, DOB 28-May-1955, MRN [id number]  Patient Location: Home Provider Location: Office  PCP:  Gara Kroner, DO  Cardiologist:  [name], MD  Electrophysiologist:  None   Evaluation Performed:  Follow-Up Visit  Chief Complaint: 6-week visit  History of Present Illness:    Jimmy Duran is a 63 y.o. male with past medical history of CAD (nonobstructive CAD by prior cath, low-risk NST in 04/2013), carotid artery stenosis, PVD (CT in 08/2018 showing chronic dissection of infrarenal abdominal aorta and narrowing along common iliac arteries), HTN, HLD, Type 2 DM, COPD, tobacco use and prior TIA who presents for a 6-week follow-up telehealth visit.  He was last examined by Dr. Domenic Polite in 11/2018 after having not been followed by [facility] since 2015. He reported having baseline dyspnea on exertion but denied any recent change in his symptoms or associated chest pain. BP was elevated at 149/96 and it was recommended that he resume his previous regimen including Lopressor 25 mg twice daily and Lisinopril-HCTZ 20-12.5mg  daily along with  obtaining a repeat echocardiogram. Follow-up labs showed stable electrolytes and kidney function.  Echocardiogram showed a preserved EF of 60 to 65% with moderate LVH. He did have mild aortic sclerosis without stenosis.  In talking with the patient today, he reports much improvement in his symptoms since his last office visit. He has been checking his blood pressure and it has significantly improved with the recent medication adjustments. BP was at 111/73 on most recent check. He denies any associated lightheadedness, dizziness or presyncope. No recent chest pain, palpitations, orthopnea, PND or lower extremity edema. He does have baseline dyspnea on exertion in the setting of COPD but denies any change in his symptoms. He continues to smoke 0.5 ppd but says he was previously smoking 2-3 ppd. He does not use inhalers regularly as he feels like this actually makes his breathing worse at times.  The patient does not have symptoms concerning for COVID-19 infection (fever, chills, cough, or new shortness of breath).    Past Medical History:  Diagnosis Date  . AAA (abdominal aortic aneurysm) ([place])   . Arthritis   . Carotid artery disease ([place])   . Colon polyp   . COPD (chronic obstructive pulmonary disease) ([place])   . Coronary artery disease    Nonobstructive  . Dyspnea   . Dysrhythmia   . Essential hypertension   . GERD (gastroesophageal reflux disease)   . Hemorrhoids   . History of orthopnea   . Low back pain   . Lumbar radiculopathy   . Mixed hyperlipidemia   . Peripheral vascular disease ([place])   . Stroke [facility])    "ministrokes"-memory  deficits  . Type 2 diabetes mellitus ([place])    Past Surgical History:  Procedure Laterality Date  . APPENDECTOMY  1970  . BIOPSY  12/08/2017   Procedure: BIOPSY;  Surgeon: [name], MD;  [facility];  Service: Endoscopy;;  esophagus   . BREAST SURGERY Right    Cyst resection on the right  . CATARACT EXTRACTION W/PHACO Right 06/18/2016    Procedure: CATARACT EXTRACTION PHACO AND INTRAOCULAR LENS PLACEMENT (IOC);  Surgeon: [name], MD;  Location: [facility] ORS;  Service: Ophthalmology;  Laterality: Right;  [name] 35.7 AP% 16.6 CDE 5.94 Fluid pack lot # [id number]  . CATARACT EXTRACTION W/PHACO Left 07/16/2016   Procedure: CATARACT EXTRACTION PHACO AND INTRAOCULAR LENS PLACEMENT (IOC);  Surgeon: [name], MD;  Location: [facility] ORS;  Service: Ophthalmology;  Laterality: Left;  [name] 51.8 AP% 12.7 CDE 6.58 Fluid Pack Lot # [id number]  . CHOLECYSTECTOMY  1993  . COLONOSCOPY  02/12/2007   [id number] friable anal canal hemorrhoids, otherwise normal rectum and colon  . COLONOSCOPY N/A 07/16/2012   RMR: Colonic diverticulosis. 4 mm tubular adenoma  . COLONOSCOPY WITH PROPOFOL N/A 12/08/2017   Dr. Gala Romney: sigmoid and descending colon diverticulosis, transverse colon polyp (TUBULAR ADENOMA)  . ESOPHAGOGASTRODUODENOSCOPY (EGD) WITH ESOPHAGEAL DILATION N/A 07/16/2012   RMR: +Candida esophagitis, Erosive reflux esophagiits. Schatizi's ring status post dilation. Hiatal hernia. Antral erosions status post biopsy.   . ESOPHAGOGASTRODUODENOSCOPY (EGD) WITH PROPOFOL N/A 12/08/2017   Dr. Gala Romney: esophagitis, query EOE but negative for increased eosinophils on path, small hiatal hernia, normal stomach, normal duodenum, PAS stain with rare yeast forms on stain. Empiric dilation  . HERNIA REPAIR    . JOINT REPLACEMENT    . KNEE SURGERY     Left knee arthroscopy torn medial meniscus grade 4 chondral changes medial femoral condyle tibial plateau  . MALONEY DILATION N/A 12/08/2017   Procedure: Venia Minks DILATION;  Surgeon: [name], MD;  [facility];  Service: Endoscopy;  Laterality: N/A;  . POLYPECTOMY  12/08/2017   Procedure: POLYPECTOMY;  Surgeon: [name], MD;  [facility];  Service: Endoscopy;;  colon  . TOTAL KNEE ARTHROPLASTY Left 04/27/2012   Procedure: TOTAL KNEE ARTHROPLASTY;  Surgeon: Carole Civil,  MD;  Location: [facility];  Service: Orthopedics;  Laterality: Left;  . UMBILICAL HERNIA REPAIR  1993     Current Meds  Medication Sig  . aspirin EC 81 MG tablet Take 81 mg by mouth daily.  . Aspirin-Salicylamide-Caffeine (BC HEADACHE POWDER PO) Take 1 Package by mouth daily as needed (headache).  Marland Kitchen buPROPion (WELLBUTRIN SR) 150 MG 12 hr tablet Take 150 mg by mouth 2 (two) times daily.  Marland Kitchen lisinopril-hydrochlorothiazide (ZESTORETIC) 20-12.5 MG tablet Take 1 tablet by mouth daily.  Marland Kitchen lovastatin (MEVACOR) 40 MG tablet Take 40 mg by mouth at bedtime.  . metFORMIN (GLUCOPHAGE) 1000 MG tablet Take 1,000 mg by mouth 2 (two) times daily with a meal.  . metoprolol tartrate (LOPRESSOR) 25 MG tablet Take 1 tablet (25 mg total) by mouth 2 (two) times daily.  Marland Kitchen NOVOLOG FLEXPEN 100 UNIT/ML FlexPen INJECT 3 UNITS SUBCUTANEOUSLY BEFORE EACH MEAL  . pantoprazole (PROTONIX) 40 MG tablet Take 1 tablet (40 mg total) by mouth daily. (Patient taking differently: Take 40 mg by mouth 2 (two) times daily. )  . TOUJEO SOLOSTAR 300 UNIT/ML SOPN      Allergies:   Patient has no known allergies.   Social History   Tobacco Use  .  Smoking status: Current Every Day Smoker    Packs/day: 0.50    Years: 30.00    Pack years: 15.00    Types: Cigarettes  . Smokeless tobacco: Current User    Types: Chew  . Tobacco comment: 1 pk per day.   Substance Use Topics  . Alcohol use: Not Currently    Alcohol/week: 0.0 standard drinks    Comment: socially "beer and tomato juice"   . Drug use: No     Family Hx: The patient's family history includes Colon cancer in some other family members; Colon polyps in his mother; Coronary artery disease in his father.  ROS:   Please see the history of present illness.     All other systems reviewed and are negative.   Prior CV studies:   The following studies were reviewed today:  Carotid Dopplers: 08/2018 Final Interpretation Right Carotid: There is evidence in the ICA of a  40-59% stenosis. Plaque noted         in the ECA. Left Carotid: There is evidence in the ICA of a 40-59% stenosis.        Non-hemodynamically significant plaque noted in the CCA. Plaque        noted in the ECA.  Echocardiogram: 12/11/2018 IMPRESSIONS   1. Left ventricular ejection fraction, by visual estimation, is 60 to 65%. The left ventricle has normal function. There is moderately increased left ventricular hypertrophy.  2. Left ventricular diastolic Doppler parameters are consistent with impaired relaxation pattern of LV diastolic filling.  3. Global right ventricle has low normal systolic function.The right ventricular size is normal. No increase in right ventricular wall thickness.  4. Left atrial size was normal.  5. Right atrial size was normal.  6. Mild mitral annular calcification.  7. Mild to moderate aortic valve annular calcification.  8. The mitral valve is degenerative. No evidence of mitral valve regurgitation.  9. The tricuspid valve is grossly normal. Tricuspid valve regurgitation was not visualized by color flow Doppler. 10. The aortic valve is tricuspid Aortic valve regurgitation was not visualized by color flow Doppler. Mild aortic valve sclerosis without stenosis. 11. The pulmonic valve was not well visualized. Pulmonic valve regurgitation is not visualized by color flow Doppler. 12. The inferior vena cava is normal in size with greater than 50% respiratory variability, suggesting right atrial pressure of 3 mmHg.  Labs/Other Tests and Data Reviewed:    EKG:  No ECG reviewed.  Recent Labs: 01/28/2018: ALT 25; Hemoglobin 14.1; Magnesium 2.5; Platelets 277 12/15/2018: BUN 16; Creatinine, Ser 1.14; Potassium 4.1; Sodium 134   Recent Lipid Panel Lab Results  Component Value Date/Time   CHOL 230 (H) 01/05/2014 05:43 AM   TRIG 1,469 (H) 01/05/2014 05:43 AM   HDL NOT REPORTED DUE TO HIGH TRIGLYCERIDES 01/05/2014 05:43 AM   CHOLHDL NOT REPORTED  DUE TO HIGH TRIGLYCERIDES 01/05/2014 05:43 AM   LDLCALC UNABLE TO CALCULATE IF TRIGLYCERIDE OVER 400 mg/dL 01/05/2014 05:43 AM    Wt Readings from Last 3 Encounters:  01/13/19 160 lb (72.6 kg)  12/01/18 160 lb (72.6 kg)  01/28/18 153 lb (69.4 kg)     Objective:    Vital Signs:  BP 111/73   Pulse 84   Ht 5\' 7"  (1.702 m)   Wt 160 lb (72.6 kg)   BMI 25.06 kg/m    General: Pleasant male sounding NAD Psych: Normal affect. Neuro: Alert and oriented X 3.  Lungs:  Resp regular and unlabored while talking on the phone.  ASSESSMENT & PLAN:    1. CAD - He has a history of nonobstructive CAD by prior cath with most recent ischemic evaluation being a low-risk NST in 04/2013.  Recent echocardiogram showed a preserved EF of 60 to 65% as outlined above.   - He has baseline dyspnea on exertion in the setting of COPD but denies any associated chest pain.  - Continue with risk factor modification at this time. He remains on ASA, beta-blocker and statin therapy.   2. Carotid Artery Stenosis - Dopplers in 08/2018 showed 40 to 59% stenosis bilaterally. Will plan for repeat dopplers in 08/2019 and this was reviewed with the patient. Continue ASA and statin therapy.   3. 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 He is followed by Vascular Surgery (Dr. Wilhelmenia Blase with [facility]).   4. HTN - BP has significantly improved with his recent medication changes. At 111/73 on most recent check. Continue current regimen with Lopressor 25 mg twice daily and Lisinopril-HCTZ 20-12.5mg  daily.   5. HLD - Followed by PCP. Scanned labs from 05/2018 showed LDL at 45 which is at goal.  Continue Lovastatin 40 mg daily.  6. Tobacco Use - previously smoking 2-3 ppd. He has reduced his use to 0.5 ppd and was congratulated om this with continued reduction advised.   COVID-19 Education: The signs and symptoms of COVID-19 were discussed with  the patient and how to seek care for testing (follow up with PCP or arrange E-visit).  The importance of social distancing was discussed today.  Time:   Today, I have spent 18 minutes with the patient with telehealth technology discussing the above problems.     Medication Adjustments/Labs and Tests Ordered: Current medicines are reviewed at length with the patient today.  Concerns regarding medicines are outlined above.   Tests Ordered: No orders of the defined types were placed in this encounter.   Medication Changes: No orders of the defined types were placed in this encounter.   Follow Up:  In Person in 6 month(s)  Signed, Erma Heritage, PA-C  01/13/2019 5:08 PM    [facility]

## 2019-01-13 NOTE — Patient Instructions (Signed)
Medication Instructions:  Your physician recommends that you continue on your current medications as directed. Please refer to the Current Medication list given to you today.  *If you need a refill on your cardiac medications before your next appointment, please call your pharmacy*  Lab Work: NONE  If you have labs (blood work) drawn today and your tests are completely normal, you will receive your results only by: . MyChart Message (if you have MyChart) OR . A paper copy in the mail If you have any lab test that is abnormal or we need to change your treatment, we will call you to review the results.  Testing/Procedures: NONE   Follow-Up: At [facility], you and your health needs are our priority.  As part of our continuing mission to provide you with exceptional heart care, we have created designated Provider Care Teams.  These Care Teams include your primary Cardiologist (physician) and Advanced Practice Providers (APPs -  Physician Assistants and Nurse Practitioners) who all work together to provide you with the care you need, when you need it.  Your next appointment:   6 month(s)  The format for your next appointment:   In Person  Provider:   Samuel McDowell, MD  Other Instructions Thank you for choosing [facility]!    

## 2019-02-09 ENCOUNTER — Other Ambulatory Visit: Payer: Self-pay | Admitting: Internal Medicine

## 2019-02-09 ENCOUNTER — Other Ambulatory Visit (HOSPITAL_COMMUNITY): Payer: Self-pay | Admitting: Internal Medicine

## 2019-02-09 DIAGNOSIS — R109 Unspecified abdominal pain: Secondary | ICD-10-CM

## 2019-02-09 DIAGNOSIS — R634 Abnormal weight loss: Secondary | ICD-10-CM

## 2019-02-24 ENCOUNTER — Encounter (HOSPITAL_COMMUNITY): Payer: Self-pay

## 2019-02-24 ENCOUNTER — Ambulatory Visit (HOSPITAL_COMMUNITY): Payer: Medicare HMO

## 2019-03-01 ENCOUNTER — Ambulatory Visit: Payer: Medicare HMO | Admitting: Nutrition

## 2019-04-15 ENCOUNTER — Encounter: Payer: Medicare HMO | Attending: Internal Medicine | Admitting: Nutrition

## 2019-04-15 ENCOUNTER — Encounter: Payer: Self-pay | Admitting: Nutrition

## 2019-04-15 ENCOUNTER — Other Ambulatory Visit: Payer: Self-pay

## 2019-04-15 DIAGNOSIS — F17200 Nicotine dependence, unspecified, uncomplicated: Secondary | ICD-10-CM

## 2019-04-15 DIAGNOSIS — E119 Type 2 diabetes mellitus without complications: Secondary | ICD-10-CM | POA: Diagnosis present

## 2019-04-15 DIAGNOSIS — I10 Essential (primary) hypertension: Secondary | ICD-10-CM

## 2019-04-15 DIAGNOSIS — E782 Mixed hyperlipidemia: Secondary | ICD-10-CM

## 2019-04-15 DIAGNOSIS — I2510 Atherosclerotic heart disease of native coronary artery without angina pectoris: Secondary | ICD-10-CM

## 2019-04-15 NOTE — Patient Instructions (Addendum)
Goals Follow MY Plate Eat 3-4 car choices per meal Eat meals on time Drink only water Cut out snacks Increase fresh fruits and vegetables Cut out processed foods Take insulin before breakfast and before supper. Get A1C down to 7%. COVID-19 Vaccine Information can be found at: ShippingScam.co.uk For questions related to vaccine distribution or appointments, please email vaccine@[email or web address] [email or web address] or call [phone].

## 2019-04-15 NOTE — Progress Notes (Signed)
Medical Nutrition Therapy:  Appt start time: 0800 end time:  0900.   Assessment:  Primary concerns today: Diabetes Type 2. Here with his wife but they are separated. She assists with his meds and meals.. . Limited reading ability. Never met with a RD/CDE before. Willing to work on his DM and eating better. Wants to feel better. Has problems with his vision, neuropathy and chronic fatigue. Sees Dr. Yong Channel at [facility]. Last A1C was 8.4% 05/2018. He currently is taking 70/30 insulin 25 units BID. He has been taking it for 2 months.He notes his BS are 200- 500's sometimes. But they have been bettter since taking the insulin. Couldn't take the [place] due to cost.        He is testing blood sugars twice a day. Sometimes gets dizzy, headache, blurry vision, fatigue, frequent urination.       [place] to work on eating better balanced meals on time. His wife can draw up his insulin in syringes so he can take it on time in evenings. His vision isn't well enough to do his own insulin.          HIstory of alcohol abuse but doesn't drink much at all now. Eats a lot of fast foods and processed foods. Smokes 2 packs per day. Has anxiety and has a lot of sfress. Is seeing behaviors health for that.   Willing to make changes to improve his DM.  Lab Results  Component Value Date   HGBA1C 7.8 (H) 01/04/2014   CMP Latest Ref Rng & Units 12/15/2018 01/28/2018 12/03/2017  Glucose 70 - 99 mg/dL 352(H) 86 95  BUN 8 - 23 mg/dL 16 18 12   Creatinine 0.61 - 1.24 mg/dL 1.14 1.21 1.18  Sodium 135 - 145 mmol/L 134(L) 141 144  Potassium 3.5 - 5.1 mmol/L 4.1 4.4 3.6  Chloride 98 - 111 mmol/L 94(L) 109 112(H)  CO2 22 - 32 mmol/L 27 20(L) 17(L)  Calcium 8.9 - 10.3 mg/dL 9.6 9.0 8.9  Total Protein 6.5 - 8.1 g/dL - 7.6 -  Total Bilirubin 0.3 - 1.2 mg/dL - 0.4 -  Alkaline Phos 38 - 126 U/L - 65 -  AST 15 - 41 U/L - 21 -  ALT 0 - 44 U/L - 25 -   Lipid Panel     Component Value Date/Time   CHOL 230 (H) 01/05/2014 0543   TRIG 1,469 (H) 01/05/2014 0543   HDL NOT REPORTED DUE TO HIGH TRIGLYCERIDES 01/05/2014 0543   CHOLHDL NOT REPORTED DUE TO HIGH TRIGLYCERIDES 01/05/2014 0543   VLDL UNABLE TO CALCULATE IF TRIGLYCERIDE OVER 400 mg/dL 01/05/2014 0543   LDLCALC UNABLE TO CALCULATE IF TRIGLYCERIDE OVER 400 mg/dL 01/05/2014 0543    Preferred Learning Style:   Auditory  Visual-can't read well; 8th grade education.  Hands on    Learning Readiness:   Ready  Change in progress   MEDICATIONS: see list   DIETARY INTAKE:  Eats 2-3 meals per day, snacks between. Drinks mostly water and crystal light.  Usual physical activity: walks some  Estimated energy needs: 1800-calories 200 g carbohydrates 135 g protein  50g fat  Progress Towards Goal(s):  In progress.   Nutritional Diagnosis:  NB-1.1 Food and nutrition-related knowledge deficit As related to Diabetes Type 2.  As evidenced by A1C 8.4%.    Intervention:  Nutrition and Diabetes education provided on My Plate, CHO counting, meal planning, portion sizes, timing of meals, avoiding snacks between meals unless having a low blood sugar, target ranges  for A1C and blood sugars, signs/symptoms and treatment of hyper/hypoglycemia, monitoring blood sugars, taking medications as prescribed, benefits of exercising 30 minutes per day and prevention of complications of DM.  Goals Follow MY Plate Eat 3-4 carb choices per meal Eat meals on time Drink only water Cut out snacks Increase fresh fruits and vegetables Cut out processed foods Take insulin before breakfast and before supper. Get A1C down to 7%. .  Teaching Method Utilized: none Visual Auditory Hands on  Handouts given during visit include:  The Plate Method   Meal Plan Car  Diabetes Instructions.   Barriers to learning/adherence to lifestyle change: low literacy, financial, mental/anxiety issues  Demonstrated degree of understanding via:  Teach Back   Monitoring/Evaluation:   Dietary intake, exercise, , and body weight in 1 month(s).

## 2019-04-28 ENCOUNTER — Encounter: Payer: Self-pay | Admitting: Nutrition

## 2019-05-13 ENCOUNTER — Other Ambulatory Visit: Payer: Self-pay

## 2019-05-13 ENCOUNTER — Encounter: Payer: Medicare HMO | Attending: Family Medicine | Admitting: Nutrition

## 2019-05-13 ENCOUNTER — Encounter: Payer: Self-pay | Admitting: Nutrition

## 2019-05-13 DIAGNOSIS — E118 Type 2 diabetes mellitus with unspecified complications: Secondary | ICD-10-CM | POA: Diagnosis present

## 2019-05-13 DIAGNOSIS — I2510 Atherosclerotic heart disease of native coronary artery without angina pectoris: Secondary | ICD-10-CM | POA: Diagnosis present

## 2019-05-13 DIAGNOSIS — I10 Essential (primary) hypertension: Secondary | ICD-10-CM | POA: Diagnosis not present

## 2019-05-13 DIAGNOSIS — IMO0002 Reserved for concepts with insufficient information to code with codable children: Secondary | ICD-10-CM

## 2019-05-13 DIAGNOSIS — E1165 Type 2 diabetes mellitus with hyperglycemia: Secondary | ICD-10-CM | POA: Insufficient documentation

## 2019-05-13 DIAGNOSIS — E781 Pure hyperglyceridemia: Secondary | ICD-10-CM

## 2019-05-13 DIAGNOSIS — E782 Mixed hyperlipidemia: Secondary | ICD-10-CM | POA: Insufficient documentation

## 2019-05-13 NOTE — Progress Notes (Signed)
Medical Nutrition Therapy:  Appt start time: 1130 end time:  1200  Assessment:  Primary concerns today: Diabetes Type 2. Here with his wife but they are separated. She assists with his meds and meals.. BS log brought in. He has been working on eating meals on time and taking his insulin at meal time better. BS improved  fBS 110-170's Before dinner 130-250's. Is avoiding snacks. 25 units of 70/30  BID. Saw Dr. Yong Channel, [facility] a month ago and his A1C was 7.4%, down from 8.4%. Had an upset stomach this am and couldn't eat breakfast but took his shot.  Felt weak and nauseated. BS 103 mg/dl. Hadn't eaten lunch either. Gave some juice to drink for his ride home to prevent low blood sugars.  Willing to continue to eat better and eat meals on time.   CMP Latest Ref Rng & Units 12/15/2018 01/28/2018 12/03/2017  Glucose 70 - 99 mg/dL 352(H) 86 95  BUN 8 - 23 mg/dL 16 18 12   Creatinine 0.61 - 1.24 mg/dL 1.14 1.21 1.18  Sodium 135 - 145 mmol/L 134(L) 141 144  Potassium 3.5 - 5.1 mmol/L 4.1 4.4 3.6  Chloride 98 - 111 mmol/L 94(L) 109 112(H)  CO2 22 - 32 mmol/L 27 20(L) 17(L)  Calcium 8.9 - 10.3 mg/dL 9.6 9.0 8.9  Total Protein 6.5 - 8.1 g/dL - 7.6 -  Total Bilirubin 0.3 - 1.2 mg/dL - 0.4 -  Alkaline Phos 38 - 126 U/L - 65 -  AST 15 - 41 U/L - 21 -  ALT 0 - 44 U/L - 25 -   Lipid Panel     Component Value Date/Time   CHOL 230 (H) 01/05/2014 0543   TRIG 1,469 (H) 01/05/2014 0543   HDL NOT REPORTED DUE TO HIGH TRIGLYCERIDES 01/05/2014 0543   CHOLHDL NOT REPORTED DUE TO HIGH TRIGLYCERIDES 01/05/2014 0543   VLDL UNABLE TO CALCULATE IF TRIGLYCERIDE OVER 400 mg/dL 01/05/2014 0543   LDLCALC UNABLE TO CALCULATE IF TRIGLYCERIDE OVER 400 mg/dL 01/05/2014 0543    Preferred Learning Style:   Auditory  Visual-can't read well; 8th grade education.  Hands on    Learning Readiness:   Ready  Change in progress   MEDICATIONS: see list   DIETARY INTAKE:  Eats 2-3 meals per day, snacks  between. Drinks mostly water and crystal light.  Usual physical activity: walks some  Estimated energy needs: 1800-calories 200 g carbohydrates 135 g protein  50g fat  Progress Towards Goal(s):  In progress.   Nutritional Diagnosis:  NB-1.1 Food and nutrition-related knowledge deficit As related to Diabetes Type 2.  As evidenced by A1C 8.4%.    Intervention:  Nutrition and Diabetes education provided on My Plate, CHO counting, meal planning, portion sizes, timing of meals, avoiding snacks between meals unless having a low blood sugar, target ranges for A1C and blood sugars, signs/symptoms and treatment of hyper/hypoglycemia, monitoring blood sugars, taking medications as prescribed, benefits of exercising 30 minutes per day and prevention of complications of DM.  Goals Keep up the good job! Let's keep working on these goals. Follow MY Plate Eat 3-4 carb choices per meal Eat meals on time Drink only water Cut out snacks Increase fresh fruits and vegetables Cut out processed foods Take insulin before breakfast and before supper. Get A1C down to 7%. .  Teaching Method Utilized: none Visual Auditory Hands on  Handouts given during visit include:  The Plate Method   Meal Plan Car  Diabetes Instructions.   Barriers to learning/adherence  to lifestyle change: low literacy, financial, mental/anxiety issues  Demonstrated degree of understanding via:  Teach Back   Monitoring/Evaluation:  Dietary intake, exercise, , and body weight in 1 month(s).

## 2019-05-13 NOTE — Patient Instructions (Signed)
Goals Keep up the good job! Let's keep working on these goals. Follow MY Plate Eat 3-4 carb choices per meal Eat meals on time Drink only water Cut out snacks Increase fresh fruits and vegetables Cut out processed foods Take insulin before breakfast and before supper. Get A1C down to 7%. Marland Kitchen

## 2019-07-20 ENCOUNTER — Encounter: Payer: Self-pay | Admitting: Cardiology

## 2019-07-20 NOTE — Progress Notes (Signed)
Virtual Visit via Telephone Note   This visit type was conducted due to national recommendations for restrictions regarding the COVID-19 Pandemic (e.g. social distancing) in an effort to limit this patient's exposure and mitigate transmission in our community.  Due to his co-morbid illnesses, this patient is at least at moderate risk for complications without adequate follow up.  This format is felt to be most appropriate for this patient at this time.  The patient did not have access to video technology/had technical difficulties with video requiring transitioning to audio format only (telephone).  All issues noted in this document were discussed and addressed.  No physical exam could be performed with this format.  Please refer to the patient's chart for his  consent to telehealth for [facility].   The patient was identified using 2 identifiers.  Date:  07/21/2019   ID:  Jimmy Duran, DOB December 19, 1955, MRN [id number]  Patient Location: Home Provider Location: Home  PCP:  Gara Kroner, DO  Cardiologist:  [name], MD Electrophysiologist:  None   Evaluation Performed:  Follow-Up Visit  Chief Complaint:   Cardiac follow-up  History of Present Illness:    Jimmy Duran is a 64 y.o. male last assessed via telehealth encounter in November 2020 by Ms. Strader PA-C.  We spoke by phone today.  He states that he has been doing relatively well since the weather has warmed up.  He enjoys getting outdoors, walking for exercise.  He does not describe any exertional chest pain or unusual shortness of breath.  Blood pressure was elevated today, but this was taken prior to his regular medications.  He states that usually his systolic is in the [id number] to 130s.  I reviewed his current regimen which is outlined below.  He is due for follow-up carotid Dopplers in June.  These will be arranged.  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   Past Medical History:  Diagnosis Date  . AAA (abdominal aortic aneurysm) ([place])   . Arthritis   . Carotid artery disease ([place])   . Colon polyp   . COPD (chronic obstructive pulmonary disease) ([place])   . Coronary artery disease    Nonobstructive  . Essential hypertension   . GERD (gastroesophageal reflux disease)   . Hemorrhoids   . History of TIA (transient ischemic attack)   . Low back pain   . Lumbar radiculopathy   . Mixed hyperlipidemia   . Peripheral vascular disease ([place])   . Type 2 diabetes mellitus ([place])    Past Surgical History:  Procedure Laterality Date  . APPENDECTOMY  1970  . BIOPSY  12/08/2017   Procedure: BIOPSY;  Surgeon: [name], MD;  [facility];  Service: Endoscopy;;  esophagus   . BREAST SURGERY Right    Cyst resection on the right  . CATARACT EXTRACTION W/PHACO Right 06/18/2016   Procedure: CATARACT EXTRACTION PHACO AND INTRAOCULAR LENS PLACEMENT (IOC);  Surgeon: [name], MD;  Location: [facility] ORS;  Service: Ophthalmology;  Laterality: Right;  [name] 35.7 AP% 16.6 CDE 5.94 Fluid pack lot # [id number]  . CATARACT EXTRACTION W/PHACO Left 07/16/2016   Procedure: CATARACT EXTRACTION PHACO AND INTRAOCULAR LENS PLACEMENT (IOC);  Surgeon: [name], MD;  Location: [facility] ORS;  Service: Ophthalmology;  Laterality: Left;  [name] 51.8 AP% 12.7 CDE 6.58 Fluid Pack Lot # [id number]  .  CHOLECYSTECTOMY  1993  . COLONOSCOPY  02/12/2007   [id number] friable anal canal hemorrhoids, otherwise normal rectum and colon  . COLONOSCOPY N/A 07/16/2012   RMR: Colonic diverticulosis. 4 mm tubular adenoma  . COLONOSCOPY WITH PROPOFOL N/A 12/08/2017   Dr. Gala Romney: sigmoid and descending colon diverticulosis, transverse colon polyp (TUBULAR ADENOMA)  . ESOPHAGOGASTRODUODENOSCOPY (EGD) WITH ESOPHAGEAL DILATION N/A 07/16/2012   RMR: +Candida esophagitis, Erosive reflux esophagiits.  Schatizi's ring status post dilation. Hiatal hernia. Antral erosions status post biopsy.   . ESOPHAGOGASTRODUODENOSCOPY (EGD) WITH PROPOFOL N/A 12/08/2017   Dr. Gala Romney: esophagitis, query EOE but negative for increased eosinophils on path, small hiatal hernia, normal stomach, normal duodenum, PAS stain with rare yeast forms on stain. Empiric dilation  . HERNIA REPAIR    . JOINT REPLACEMENT    . KNEE SURGERY     Left knee arthroscopy torn medial meniscus grade 4 chondral changes medial femoral condyle tibial plateau  . MALONEY DILATION N/A 12/08/2017   Procedure: Venia Minks DILATION;  Surgeon: [name], MD;  [facility];  Service: Endoscopy;  Laterality: N/A;  . POLYPECTOMY  12/08/2017   Procedure: POLYPECTOMY;  Surgeon: [name], MD;  [facility];  Service: Endoscopy;;  colon  . TOTAL KNEE ARTHROPLASTY Left 04/27/2012   Procedure: TOTAL KNEE ARTHROPLASTY;  Surgeon: Carole Civil, MD;  Location: [facility] ORS;  Service: Orthopedics;  Laterality: Left;  . UMBILICAL HERNIA REPAIR  1993     Current Meds  Medication Sig  . aspirin EC 81 MG tablet Take 81 mg by mouth daily.  . Aspirin-Salicylamide-Caffeine (BC HEADACHE POWDER PO) Take 1 Package by mouth daily as needed (headache).  Marland Kitchen buPROPion (WELLBUTRIN SR) 150 MG 12 hr tablet Take 150 mg by mouth 2 (two) times daily.  . diphenhydrAMINE (BENADRYL) 25 MG tablet Take 25 mg by mouth daily.  Marland Kitchen lisinopril-hydrochlorothiazide (ZESTORETIC) 20-12.5 MG tablet Take 1 tablet by mouth daily.  . metFORMIN (GLUCOPHAGE) 1000 MG tablet Take 1,000 mg by mouth 2 (two) times daily with a meal.  . metoprolol tartrate (LOPRESSOR) 25 MG tablet Take 1 tablet (25 mg total) by mouth 2 (two) times daily.  Marland Kitchen NOVOLIN N 100 UNIT/ML injection   . pantoprazole (PROTONIX) 40 MG tablet Take 1 tablet (40 mg total) by mouth daily. (Patient taking differently: Take 40 mg by mouth 2 (two) times daily. )  . rosuvastatin (CRESTOR) 40 MG tablet   .  [DISCONTINUED] NOVOLOG FLEXPEN 100 UNIT/ML FlexPen INJECT 3 UNITS SUBCUTANEOUSLY BEFORE EACH MEAL     Allergies:   Patient has no known allergies.   ROS:   No palpitations or syncope.  Prior CV studies:   The following studies were reviewed today:  Echocardiogram 12/11/2018: 1. Left ventricular ejection fraction, by visual estimation, is 60 to  65%. The left ventricle has normal function. There is moderately increased  left ventricular hypertrophy.  2. Left ventricular diastolic Doppler parameters are consistent with  impaired relaxation pattern of LV diastolic filling.  3. Global right ventricle has low normal systolic function.The right  ventricular size is normal. No increase in right ventricular wall  thickness.  4. Left atrial size was normal.  5. Right atrial size was normal.  6. Mild mitral annular calcification.  7. Mild to moderate aortic valve annular calcification.  8. The mitral valve is degenerative. No evidence of mitral valve  regurgitation.  9. The tricuspid valve is grossly normal. Tricuspid valve regurgitation  was not visualized by color flow Doppler.  10. The aortic valve is tricuspid Aortic valve regurgitation was not  visualized by color flow Doppler. Mild aortic valve sclerosis without  stenosis.  11. The pulmonic valve was not well visualized. Pulmonic valve  regurgitation is not visualized by color flow Doppler.  12. The inferior vena cava is normal in size with greater than 50%  respiratory variability, suggesting right atrial pressure of 3 mmHg.  Labs/Other Tests and Data Reviewed:    EKG:  An ECG dated 12/01/2018 was personally reviewed today and demonstrated:  Normal sinus rhythm.  Recent Labs: 12/15/2018: BUN 16; Creatinine, Ser 1.14; Potassium 4.1; Sodium 134  March 2020: Cholesterol 124, triglycerides 363, HDL 41, LDL 45  Wt Readings from Last 3 Encounters:  07/21/19 160 lb (72.6 kg)  04/15/19 162 lb (73.5 kg)  01/13/19 160 lb (72.6  kg)     Objective:    Vital Signs:  BP (!) 170/60   Pulse 92   Ht 5\' 7"  (1.702 m)   Wt 160 lb (72.6 kg)   BMI 25.06 kg/m    Patient spoke in full sentences, not short of breath. No audible wheezing or coughing.  ASSESSMENT & PLAN:    1.  History of nonobstructive CAD with follow-up low risk myocardial perfusion study as of 2015.  LVEF in October 2020 was normal range at 60 to 65%.  He does not describe any active angina and we will plan medical therapy and observation.  Continue aspirin, Zestoretic, Lopressor, and Crestor.  2.  Mixed hyperlipidemia, tolerating high-dose Crestor.  His last LDL was 45.  Keep follow-up with PCP for repeat lab work this year.  3.  Essential hypertension, blood pressure elevated today prior to regular medications.  He states his systolics are typically in the 120s to 130s.  Continue to follow in case further medication adjustments are needed.  4.  Carotid artery disease, due for follow-up carotid Dopplers in June, these will be arranged.   Time:   Today, I have spent 5 minutes with the patient with telehealth technology discussing the above problems.     Medication Adjustments/Labs and Tests Ordered: Current medicines are reviewed at length with the patient today.  Concerns regarding medicines are outlined above.   Tests Ordered: Orders Placed This Encounter  Procedures  . US Carotid Duplex Bilateral    Medication Changes: No orders of the defined types were placed in this encounter.   Follow Up:  In Person 6 months in the [place] office.  Signed, [name], MD  07/21/2019 9:03 AM    [place]

## 2019-07-21 ENCOUNTER — Other Ambulatory Visit: Payer: Self-pay

## 2019-07-21 ENCOUNTER — Telehealth (INDEPENDENT_AMBULATORY_CARE_PROVIDER_SITE_OTHER): Payer: Medicare HMO | Admitting: Cardiology

## 2019-07-21 ENCOUNTER — Encounter: Payer: Self-pay | Admitting: Cardiology

## 2019-07-21 VITALS — BP 170/60 | HR 92 | Ht 67.0 in | Wt 160.0 lb

## 2019-07-21 DIAGNOSIS — I10 Essential (primary) hypertension: Secondary | ICD-10-CM | POA: Diagnosis not present

## 2019-07-21 DIAGNOSIS — I6523 Occlusion and stenosis of bilateral carotid arteries: Secondary | ICD-10-CM | POA: Diagnosis not present

## 2019-07-21 DIAGNOSIS — E782 Mixed hyperlipidemia: Secondary | ICD-10-CM | POA: Diagnosis not present

## 2019-07-21 DIAGNOSIS — I2510 Atherosclerotic heart disease of native coronary artery without angina pectoris: Secondary | ICD-10-CM | POA: Diagnosis not present

## 2019-07-21 NOTE — Patient Instructions (Signed)
Medication Instructions:  Your physician recommends that you continue on your current medications as directed. Please refer to the Current Medication list given to you today.  *If you need a refill on your cardiac medications before your next appointment, please call your pharmacy*   Lab Work: NONE   If you have labs (blood work) drawn today and your tests are completely normal, you will receive your results only by: Marland Kitchen MyChart Message (if you have MyChart) OR . A paper copy in the mail If you have any lab test that is abnormal or we need to change your treatment, we will call you to review the results.   Testing/Procedures: Your physician has requested that you have a carotid duplex. This test is an ultrasound of the carotid arteries in your neck. It looks at blood flow through these arteries that supply the brain with blood. Allow one hour for this exam. There are no restrictions or special instructions.    Follow-Up: At [facility], you and your health needs are our priority.  As part of our continuing mission to provide you with exceptional heart care, we have created designated Provider Care Teams.  These Care Teams include your primary Cardiologist (physician) and Advanced Practice Providers (APPs -  Physician Assistants and Nurse Practitioners) who all work together to provide you with the care you need, when you need it.  We recommend signing up for the patient portal called "MyChart".  Sign up information is provided on this After Visit Summary.  MyChart is used to connect with patients for Virtual Visits (Telemedicine).  Patients are able to view lab/test results, encounter notes, upcoming appointments, etc.  Non-urgent messages can be sent to your provider as well.   To learn more about what you can do with MyChart, go to [email or web address].    Your next appointment:   6 month(s)  The format for your next appointment:   In Person  Provider:   [name], MD or  [name], PA-C   Other Instructions Thank you for choosing [facility]!

## 2019-08-09 ENCOUNTER — Ambulatory Visit (HOSPITAL_COMMUNITY)
Admission: RE | Admit: 2019-08-09 | Discharge: 2019-08-09 | Disposition: A | Discharge status: Home / Self Care | Payer: Medicare HMO | Source: Ambulatory Visit | Attending: Cardiology | Admitting: Cardiology

## 2019-08-09 ENCOUNTER — Other Ambulatory Visit: Payer: Self-pay

## 2019-08-09 ENCOUNTER — Telehealth: Payer: Self-pay

## 2019-08-09 DIAGNOSIS — I2510 Atherosclerotic heart disease of native coronary artery without angina pectoris: Secondary | ICD-10-CM | POA: Diagnosis present

## 2019-08-09 DIAGNOSIS — I6523 Occlusion and stenosis of bilateral carotid arteries: Secondary | ICD-10-CM

## 2019-08-09 NOTE — Telephone Encounter (Signed)
-----   Message from Satira Sark, MD sent at 08/09/2019  3:02 PM EDT ----- Results reviewed.  Follow-up carotid Dopplers indicate moderate LICA stenosis and greater than 70% RICA stenosis.  Would suggest consultation with [facility] for further follow-up

## 2019-08-09 NOTE — Telephone Encounter (Signed)
Refered to [facility], results given to patient

## 2019-08-11 ENCOUNTER — Telehealth: Payer: Self-pay

## 2019-08-11 NOTE — Telephone Encounter (Signed)
Pt returning call to Nurse   Please call Estill Bamberg [phone]

## 2019-08-12 NOTE — Telephone Encounter (Signed)
Returned call to wife.

## 2019-08-13 ENCOUNTER — Telehealth: Payer: Self-pay

## 2019-08-13 NOTE — Telephone Encounter (Signed)
Ms.Ord concerned Pt complaining of left arm numbness and fingers are numb all the time.  Please call [phone]   Thanks renee

## 2019-08-13 NOTE — Telephone Encounter (Signed)
Returned call to pt. No answer. Left msg to call back.  

## 2019-08-17 ENCOUNTER — Ambulatory Visit: Payer: Medicare HMO | Admitting: Nutrition

## 2019-08-17 NOTE — Telephone Encounter (Signed)
Noted-cc 

## 2019-08-17 NOTE — Telephone Encounter (Signed)
Per wife, patient at times has pain in his left wrist and numbness in all fingers. They are going to call their pcp for an apt but wanted [name] to know.Wife states he denies any chest pain.

## 2019-08-17 NOTE — Telephone Encounter (Signed)
     Covering for Dr. Domenic Polite. Given his history of nonobstructive CAD and pain along his wrist and fingers, I agree with PCP evaluation for his symptoms initially as they are possibly secondary to neuropathy. Can see back after PCP evaluation if felt to be atypical for MSK or neuropathic pain.   Signed, Erma Heritage, PA-C 08/17/2019, 11:53 AM Pager: [phone]

## 2019-08-26 ENCOUNTER — Ambulatory Visit: Payer: Medicare HMO | Admitting: Nutrition

## 2019-09-17 ENCOUNTER — Other Ambulatory Visit: Payer: Self-pay

## 2019-09-17 DIAGNOSIS — I2510 Atherosclerotic heart disease of native coronary artery without angina pectoris: Secondary | ICD-10-CM

## 2019-09-23 DIAGNOSIS — B029 Zoster without complications: Secondary | ICD-10-CM

## 2019-09-23 HISTORY — DX: Zoster without complications: B02.9

## 2019-09-27 ENCOUNTER — Other Ambulatory Visit: Payer: Self-pay

## 2019-09-27 DIAGNOSIS — I2510 Atherosclerotic heart disease of native coronary artery without angina pectoris: Secondary | ICD-10-CM

## 2019-09-28 ENCOUNTER — Other Ambulatory Visit: Payer: Self-pay

## 2019-09-28 ENCOUNTER — Ambulatory Visit (INDEPENDENT_AMBULATORY_CARE_PROVIDER_SITE_OTHER)
Admission: RE | Admit: 2019-09-28 | Discharge: 2019-09-28 | Disposition: A | Discharge status: Home / Self Care | Payer: Medicare HMO | Source: Ambulatory Visit | Attending: Vascular Surgery | Admitting: Vascular Surgery

## 2019-09-28 ENCOUNTER — Ambulatory Visit (HOSPITAL_COMMUNITY)
Admission: RE | Admit: 2019-09-28 | Discharge: 2019-09-28 | Disposition: A | Discharge status: Home / Self Care | Payer: Medicare HMO | Source: Ambulatory Visit | Attending: Vascular Surgery | Admitting: Vascular Surgery

## 2019-09-28 ENCOUNTER — Encounter: Payer: Self-pay | Admitting: Vascular Surgery

## 2019-09-28 ENCOUNTER — Ambulatory Visit (INDEPENDENT_AMBULATORY_CARE_PROVIDER_SITE_OTHER): Payer: Medicare HMO | Admitting: Vascular Surgery

## 2019-09-28 VITALS — BP 110/74 | HR 70 | Temp 97.9°F | Resp 18 | Ht 67.0 in | Wt 155.5 lb

## 2019-09-28 DIAGNOSIS — I2510 Atherosclerotic heart disease of native coronary artery without angina pectoris: Secondary | ICD-10-CM | POA: Diagnosis present

## 2019-09-28 DIAGNOSIS — I6523 Occlusion and stenosis of bilateral carotid arteries: Secondary | ICD-10-CM

## 2019-09-28 NOTE — Progress Notes (Signed)
Vascular and Vein Specialist of [place]  Patient name: Jimmy Duran MRN: [id number] DOB: September 15, 1955 Sex: male  REASON FOR CONSULT: Evaluation carotid stenosis and also prior history of aortic dissection  HPI: Jimmy Duran is a 64 y.o. male, who is here today with his wife for discussion of carotid disease.  He has known history of moderate stenosis which is been followed by ultrasound over the years.  He recently had repeat duplex showing progression to critical disease in his right internal carotid artery.  He has had episode of numbness in his left hand over the last several months.  He reports initially this was thumb and index finger and is now his whole hand.  It is difficult to know whether this is a central issue.  He does not have any cervical disease.  He denies any weakness in his left hand.  Has had no left leg issues.  He is right-handed.  He does have a long history of cigarette smoking.  He is try to drop back and is now smoking half pack cigarettes per day.  He had a CT scan of his chest, abdomen and pelvis in 2018.  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  This was not flow-limiting.  I am unable to see his films from 2011 but the report from 2018 with this was unchanged.  He had no prior symptoms and this was an incidental finding.  Past Medical History:  Diagnosis Date  . AAA (abdominal aortic aneurysm) ([place])   . Arthritis   . Carotid artery disease ([place])   . Colon polyp   . COPD (chronic obstructive pulmonary disease) ([place])   . Coronary artery disease    Nonobstructive  . Essential hypertension   . GERD (gastroesophageal reflux disease)   . Hemorrhoids   . History of TIA (transient ischemic attack)   . Low back pain   . Lumbar radiculopathy   . Mixed hyperlipidemia   . Peripheral vascular disease ([place])   . Shingles 09/23/2019  . Type 2 diabetes mellitus (HCC)     Family History   Problem Relation Age of Onset  . Colon polyps Mother   . Coronary artery disease Father        CABG in his 29's  . Colon cancer Other        maternal great aunt  . Colon cancer Other        paternal great aunt    SOCIAL HISTORY: Social History   Socioeconomic History  . Marital status: Legally Separated    Spouse name: Not on file  . Number of children: Not on file  . Years of education: Not on file  . Highest education level: Not on file  Occupational History  . Occupation: Retail banker: [employer]  Tobacco Use  . Smoking status: Current Every Day Smoker    Packs/day: 0.50    Years: 30.00    Pack years: 15.00    Types: Cigarettes  . Smokeless tobacco: Current User    Types: Chew  . Tobacco comment: 1 pk per day.   Vaping Use  . Vaping Use: Former  Substance and Sexual Activity  . Alcohol use: Not Currently    Alcohol/week: 0.0 standard drinks    Comment: socially "beer and tomato juice"   . Drug use: No  . Sexual activity: Yes    Birth control/protection: None  Other Topics Concern  .  Not on file  Social History Narrative  . Not on file   Social Determinants of Health   Financial Resource Strain:   . Difficulty of Paying Living Expenses:   Food Insecurity:   . Worried About Charity fundraiser in the Last Year:   . Arboriculturist in the Last Year:   Transportation Needs:   . Film/video editor (Medical):   Marland Kitchen Lack of Transportation (Non-Medical):   Physical Activity:   . Days of Exercise per Week:   . Minutes of Exercise per Session:   Stress:   . Feeling of Stress :   Social Connections:   . Frequency of Communication with Friends and Family:   . Frequency of Social Gatherings with Friends and Family:   . Attends Religious Services:   . Active Member of Clubs or Organizations:   . Attends Archivist Meetings:   Marland Kitchen Marital Status:   Intimate Partner Violence:   . Fear of Current or Ex-Partner:   . Emotionally  Abused:   Marland Kitchen Physically Abused:   . Sexually Abused:     No Known Allergies  Current Outpatient Medications  Medication Sig Dispense Refill  . aspirin EC 81 MG tablet Take 81 mg by mouth daily.    . Aspirin-Salicylamide-Caffeine (BC HEADACHE POWDER PO) Take 1 Package by mouth daily as needed (headache).    Marland Kitchen buPROPion (WELLBUTRIN SR) 150 MG 12 hr tablet Take 150 mg by mouth 2 (two) times daily.    . diphenhydrAMINE (BENADRYL) 25 MG tablet Take 25 mg by mouth daily.    Marland Kitchen lisinopril-hydrochlorothiazide (ZESTORETIC) 20-12.5 MG tablet Take 1 tablet by mouth daily. 90 tablet 3  . metFORMIN (GLUCOPHAGE) 1000 MG tablet Take 1,000 mg by mouth 2 (two) times daily with a meal.    . NOVOLIN N 100 UNIT/ML injection 25 Units 2 (two) times daily before a meal.     . pantoprazole (PROTONIX) 40 MG tablet Take 1 tablet (40 mg total) by mouth daily. (Patient taking differently: Take 40 mg by mouth 2 (two) times daily. ) 60 tablet 3  . rosuvastatin (CRESTOR) 40 MG tablet     . metoprolol tartrate (LOPRESSOR) 25 MG tablet Take 1 tablet (25 mg total) by mouth 2 (two) times daily. 180 tablet 3   No current facility-administered medications for this visit.    REVIEW OF SYSTEMS:  [X]  denotes positive finding, [ ]  denotes negative finding Cardiac  Comments:  Chest pain or chest pressure:    Shortness of breath upon exertion:    Short of breath when lying flat:    Irregular heart rhythm:        Vascular    Pain in calf, thigh, or hip brought on by ambulation:    Pain in feet at night that wakes you up from your sleep:     Blood clot in your veins:    Leg swelling:         Pulmonary    Oxygen at home:    Productive cough:     Wheezing:         Neurologic    Sudden weakness in arms or legs:     Sudden numbness in arms or legs:     Sudden onset of difficulty speaking or slurred speech:    Temporary loss of vision in one eye:     Problems with dizziness:         Gastrointestinal    Blood in stool:  Vomited blood:         Genitourinary    Burning when urinating:     Blood in urine:        Psychiatric    Major depression:         Hematologic    Bleeding problems:    Problems with blood clotting too easily:        Skin    Rashes or ulcers:        Constitutional    Fever or chills:      PHYSICAL EXAM: Vitals:   09/28/19 0902 09/28/19 0905  BP: (!) 98/63 110/74  Pulse: 70   Resp: 18   Temp: 97.9 F (36.6 C)   TempSrc: Temporal   SpO2: 95%   Weight: 155 lb 8 oz (70.5 kg)   Height: 5\' 7"  (1.702 m)     GENERAL: The patient is a well-nourished male, in no acute distress. The vital signs are documented above. CARDIOVASCULAR: Carotid arteries without bruits bilaterally.  2+ radial 2+ femoral and 2+ popliteal pulses bilaterally PULMONARY: There is good air exchange  ABDOMEN: Soft and non-tender  MUSCULOSKELETAL: There are no major deformities or cyanosis. NEUROLOGIC: No focal weakness or paresthesias are detected. SKIN: There are no ulcers or rashes noted. PSYCHIATRIC: The patient has a normal affect.  DATA:  He underwent repeat carotid duplex today in our office of his right carotid and this does confirm a critical carotid disease with normal internal carotid distally.  He also underwent duplex of his aortoiliac segments showing no evidence of aneurysmal change.  Does have a dissection in the infrarenal aorta.  MEDICAL ISSUES: Had long discussion with the patient and his wife.  It is unclear as to whether his left hand symptoms are related to carotid disease.  I explained we would recommend right carotid endarterectomy even for asymptomatic disease with his critical level of stenosis.  I discussed the procedure in detail including the expected 1 night hospitalization.  Also discussed the 1 to 1/2% risk of stroke with surgery.  He understands and wishes to proceed after an upcoming out-of-state vacation.  He will continue on his daily aspirin therapy   Rosetta Posner, MD [facility] Vascular and Vein Specialists of [facility] Tel [phone] Pager [phone]

## 2019-09-28 NOTE — H&P (View-Only) (Signed)
Vascular and Vein Specialist of [place]  Patient name: Jimmy Duran MRN: [id number] DOB: Aug 06, 1955 Sex: male  REASON FOR CONSULT: Evaluation carotid stenosis and also prior history of aortic dissection  HPI: Jimmy Duran is a 64 y.o. male, who is here today with his wife for discussion of carotid disease.  He has known history of moderate stenosis which is been followed by ultrasound over the years.  He recently had repeat duplex showing progression to critical disease in his right internal carotid artery.  He has had episode of numbness in his left hand over the last several months.  He reports initially this was thumb and index finger and is now his whole hand.  It is difficult to know whether this is a central issue.  He does not have any cervical disease.  He denies any weakness in his left hand.  Has had no left leg issues.  He is right-handed.  He does have a long history of cigarette smoking.  He is try to drop back and is now smoking half pack cigarettes per day.  He had a CT scan of his chest, abdomen and pelvis in 2018.  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  This was not flow-limiting.  I am unable to see his films from 2011 but the report from 2018 with this was unchanged.  He had no prior symptoms and this was an incidental finding.  Past Medical History:  Diagnosis Date  . AAA (abdominal aortic aneurysm) ([place])   . Arthritis   . Carotid artery disease ([place])   . Colon polyp   . COPD (chronic obstructive pulmonary disease) ([place])   . Coronary artery disease    Nonobstructive  . Essential hypertension   . GERD (gastroesophageal reflux disease)   . Hemorrhoids   . History of TIA (transient ischemic attack)   . Low back pain   . Lumbar radiculopathy   . Mixed hyperlipidemia   . Peripheral vascular disease ([place])   . Shingles 09/23/2019  . Type 2 diabetes mellitus (HCC)     Family History   Problem Relation Age of Onset  . Colon polyps Mother   . Coronary artery disease Father        CABG in his 63's  . Colon cancer Other        maternal great aunt  . Colon cancer Other        paternal great aunt    SOCIAL HISTORY: Social History   Socioeconomic History  . Marital status: Legally Separated    Spouse name: Not on file  . Number of children: Not on file  . Years of education: Not on file  . Highest education level: Not on file  Occupational History  . Occupation: Retail banker: [employer]  Tobacco Use  . Smoking status: Current Every Day Smoker    Packs/day: 0.50    Years: 30.00    Pack years: 15.00    Types: Cigarettes  . Smokeless tobacco: Current User    Types: Chew  . Tobacco comment: 1 pk per day.   Vaping Use  . Vaping Use: Former  Substance and Sexual Activity  . Alcohol use: Not Currently    Alcohol/week: 0.0 standard drinks    Comment: socially "beer and tomato juice"   . Drug use: No  . Sexual activity: Yes    Birth control/protection: None  Other Topics Concern  .  Not on file  Social History Narrative  . Not on file   Social Determinants of Health   Financial Resource Strain:   . Difficulty of Paying Living Expenses:   Food Insecurity:   . Worried About Charity fundraiser in the Last Year:   . Arboriculturist in the Last Year:   Transportation Needs:   . Film/video editor (Medical):   Marland Kitchen Lack of Transportation (Non-Medical):   Physical Activity:   . Days of Exercise per Week:   . Minutes of Exercise per Session:   Stress:   . Feeling of Stress :   Social Connections:   . Frequency of Communication with Friends and Family:   . Frequency of Social Gatherings with Friends and Family:   . Attends Religious Services:   . Active Member of Clubs or Organizations:   . Attends Archivist Meetings:   Marland Kitchen Marital Status:   Intimate Partner Violence:   . Fear of Current or Ex-Partner:   . Emotionally  Abused:   Marland Kitchen Physically Abused:   . Sexually Abused:     No Known Allergies  Current Outpatient Medications  Medication Sig Dispense Refill  . aspirin EC 81 MG tablet Take 81 mg by mouth daily.    . Aspirin-Salicylamide-Caffeine (BC HEADACHE POWDER PO) Take 1 Package by mouth daily as needed (headache).    Marland Kitchen buPROPion (WELLBUTRIN SR) 150 MG 12 hr tablet Take 150 mg by mouth 2 (two) times daily.    . diphenhydrAMINE (BENADRYL) 25 MG tablet Take 25 mg by mouth daily.    Marland Kitchen lisinopril-hydrochlorothiazide (ZESTORETIC) 20-12.5 MG tablet Take 1 tablet by mouth daily. 90 tablet 3  . metFORMIN (GLUCOPHAGE) 1000 MG tablet Take 1,000 mg by mouth 2 (two) times daily with a meal.    . NOVOLIN N 100 UNIT/ML injection 25 Units 2 (two) times daily before a meal.     . pantoprazole (PROTONIX) 40 MG tablet Take 1 tablet (40 mg total) by mouth daily. (Patient taking differently: Take 40 mg by mouth 2 (two) times daily. ) 60 tablet 3  . rosuvastatin (CRESTOR) 40 MG tablet     . metoprolol tartrate (LOPRESSOR) 25 MG tablet Take 1 tablet (25 mg total) by mouth 2 (two) times daily. 180 tablet 3   No current facility-administered medications for this visit.    REVIEW OF SYSTEMS:  [X]  denotes positive finding, [ ]  denotes negative finding Cardiac  Comments:  Chest pain or chest pressure:    Shortness of breath upon exertion:    Short of breath when lying flat:    Irregular heart rhythm:        Vascular    Pain in calf, thigh, or hip brought on by ambulation:    Pain in feet at night that wakes you up from your sleep:     Blood clot in your veins:    Leg swelling:         Pulmonary    Oxygen at home:    Productive cough:     Wheezing:         Neurologic    Sudden weakness in arms or legs:     Sudden numbness in arms or legs:     Sudden onset of difficulty speaking or slurred speech:    Temporary loss of vision in one eye:     Problems with dizziness:         Gastrointestinal    Blood in stool:  Vomited blood:         Genitourinary    Burning when urinating:     Blood in urine:        Psychiatric    Major depression:         Hematologic    Bleeding problems:    Problems with blood clotting too easily:        Skin    Rashes or ulcers:        Constitutional    Fever or chills:      PHYSICAL EXAM: Vitals:   09/28/19 0902 09/28/19 0905  BP: (!) 98/63 110/74  Pulse: 70   Resp: 18   Temp: 97.9 F (36.6 C)   TempSrc: Temporal   SpO2: 95%   Weight: 155 lb 8 oz (70.5 kg)   Height: 5\' 7"  (1.702 m)     GENERAL: The patient is a well-nourished male, in no acute distress. The vital signs are documented above. CARDIOVASCULAR: Carotid arteries without bruits bilaterally.  2+ radial 2+ femoral and 2+ popliteal pulses bilaterally PULMONARY: There is good air exchange  ABDOMEN: Soft and non-tender  MUSCULOSKELETAL: There are no major deformities or cyanosis. NEUROLOGIC: No focal weakness or paresthesias are detected. SKIN: There are no ulcers or rashes noted. PSYCHIATRIC: The patient has a normal affect.  DATA:  He underwent repeat carotid duplex today in our office of his right carotid and this does confirm a critical carotid disease with normal internal carotid distally.  He also underwent duplex of his aortoiliac segments showing no evidence of aneurysmal change.  Does have a dissection in the infrarenal aorta.  MEDICAL ISSUES: Had long discussion with the patient and his wife.  It is unclear as to whether his left hand symptoms are related to carotid disease.  I explained we would recommend right carotid endarterectomy even for asymptomatic disease with his critical level of stenosis.  I discussed the procedure in detail including the expected 1 night hospitalization.  Also discussed the 1 to 1/2% risk of stroke with surgery.  He understands and wishes to proceed after an upcoming out-of-state vacation.  He will continue on his daily aspirin therapy   Rosetta Posner, MD [facility] Vascular and Vein Specialists of [facility] Tel [phone] Pager [phone]

## 2019-09-30 ENCOUNTER — Telehealth: Payer: Self-pay | Admitting: Cardiology

## 2019-09-30 NOTE — Telephone Encounter (Signed)
   Primary Cardiologist: [name], MD  Chart reviewed as part of pre-operative protocol coverage. Given past medical history and time since last visit, based on ACC/AHA guidelines, LATRELL POTEMPA would be at acceptable risk for the planned procedure without further cardiovascular testing.   I will route this recommendation to the requesting party via Epic fax function and remove from pre-op pool.  Please call with questions.  Jossie Ng.  NP-C    09/30/2019, 11:28 AM [place] [address] Office [phone] Fax [phone]

## 2019-09-30 NOTE — Telephone Encounter (Signed)
Please put ATTN: [name] on Fax

## 2019-09-30 NOTE — Pre-Procedure Instructions (Signed)
[place], [place] [address] Phone: [phone] Fax: [phone]     Your procedure is scheduled on Friday, August 6 from 07:29 AM- 09:43 AM.  Report to [facility] Main Entrance "A" at 05:30 A.M., and check in at the Admitting office.  Call this number if you have problems the morning of surgery:  [phone]  Call [phone] if you have any questions prior to your surgery date Monday-Friday 8am-4pm.    Remember:  Do not eat or drink after midnight the night before your surgery.    Take these medicines the morning of surgery with A SIP OF WATER: aspirin EC buPROPion (WELLBUTRIN SR) metoprolol tartrate (LOPRESSOR) pantoprazole (PROTONIX) rosuvastatin (CRESTOR)  sertraline (ZOLOFT)   IF NEEDED: diphenhydrAMINE (BENADRYL)  As of today, STOP taking any Aleve, Naproxen, Ibuprofen, Motrin, Advil, Goody's, BC's, all herbal medications, fish oil, and all vitamins.   WHAT DO I DO ABOUT MY DIABETES MEDICATION?  . THE NIGHT BEFORE SURGERY, take 12.5 units of NOVOLIN N insulin.      . THE MORNING OF SURGERY, take 12.5 units of NOVOLIN N insulin.  Do not take metFORMIN (GLUCOPHAGE) the morning of surgery.   HOW TO MANAGE YOUR DIABETES BEFORE AND AFTER SURGERY  Why is it important to control my blood sugar before and after surgery? . Improving blood sugar levels before and after surgery helps healing and can limit problems. . A way of improving blood sugar control is eating a healthy diet by: o  Eating less sugar and carbohydrates o  Increasing activity/exercise o  Talking with your doctor about reaching your blood sugar goals . High blood sugars (greater than 180 mg/dL) can raise your risk of infections and slow your recovery, so you will need to focus on controlling your diabetes during the weeks before surgery. . Make sure that the doctor who takes care of your diabetes knows about your planned surgery  including the date and location.  How do I manage my blood sugar before surgery? . Check your blood sugar at least 4 times a day, starting 2 days before surgery, to make sure that the level is not too high or low. . Check your blood sugar the morning of your surgery when you wake up and every 2 hours until you get to the Short Stay unit. o If your blood sugar is less than 70 mg/dL, you will need to treat for low blood sugar: - Do not take insulin. - Treat a low blood sugar (less than 70 mg/dL) with  cup of clear juice (cranberry or apple), 4 glucose tablets, OR glucose gel. - Recheck blood sugar in 15 minutes after treatment (to make sure it is greater than 70 mg/dL). If your blood sugar is not greater than 70 mg/dL on recheck, call [phone] for further instructions. . Report your blood sugar to the short stay nurse when you get to Short Stay.  . If you are admitted to the hospital after surgery: o Your blood sugar will be checked by the staff and you will probably be given insulin after surgery (instead of oral diabetes medicines) to make sure you have good blood sugar levels. o The goal for blood sugar control after surgery is 80-180 mg/dL.          The Morning of Surgery:             Do not wear jewelry.  Do not wear lotions, powders, colognes, or deodorant.            Men may shave face and neck.            Do not bring valuables to the hospital.            [facility] is not responsible for any belongings or valuables.  Do NOT Smoke (Tobacco/Vaping) or drink Alcohol 24 hours prior to your procedure. If you use a CPAP at night, you may bring all equipment for your overnight stay.   Contacts, glasses, dentures or bridgework may not be worn into surgery.      For patients admitted to the hospital, discharge time will be determined by your treatment team.   Patients discharged the day of surgery will not be allowed to drive home, and someone needs to stay with them for  24 hours.    Special instructions:   [place] [facility]- Preparing For Surgery  Before surgery, you can play an important role. Because skin is not sterile, your skin needs to be as free of germs as possible. You can reduce the number of germs on your skin by washing with CHG (chlorahexidine gluconate) Soap before surgery.  CHG is an antiseptic cleaner which kills germs and bonds with the skin to continue killing germs even after washing.    Oral Hygiene is also important to reduce your risk of infection.  Remember - BRUSH YOUR TEETH THE MORNING OF SURGERY WITH YOUR REGULAR TOOTHPASTE  Please do not use if you have an allergy to CHG or antibacterial soaps. If your skin becomes reddened/irritated stop using the CHG.  Do not shave (including legs and underarms) for at least 48 hours prior to first CHG shower. It is OK to shave your face.  Please follow these instructions carefully.   1. Shower the NIGHT BEFORE SURGERY and the MORNING OF SURGERY with CHG Soap.   2. If you chose to wash your hair, wash your hair first as usual with your normal shampoo.  3. After you shampoo, rinse your hair and body thoroughly to remove the shampoo.  4. Use CHG as you would any other liquid soap. You can apply CHG directly to the skin and wash gently with a scrungie or a clean washcloth.   5. Apply the CHG Soap to your body ONLY FROM THE NECK DOWN.  Do not use on open wounds or open sores. Avoid contact with your eyes, ears, mouth and genitals (private parts). Wash Face and genitals (private parts)  with your normal soap.   6. Wash thoroughly, paying special attention to the area where your surgery will be performed.  7. Thoroughly rinse your body with warm water from the neck down.  8. DO NOT shower/wash with your normal soap after using and rinsing off the CHG Soap.  9. Pat yourself dry with a CLEAN TOWEL.  10. Wear CLEAN PAJAMAS to bed the night before surgery  11. Place CLEAN SHEETS on your bed the night  of your first shower and DO NOT SLEEP WITH PETS.   Day of Surgery: Wear Clean/Comfortable clothing the morning of surgery. Do not apply any deodorants/lotions.   Remember to brush your teeth WITH YOUR REGULAR TOOTHPASTE.   Please read over the following fact sheets that you were given.

## 2019-09-30 NOTE — Telephone Encounter (Signed)
   [facility] Pre-operative Risk Assessment    HEARTCARE STAFF: - Please ensure there is not already an duplicate clearance open for this procedure. - Under Visit Info/Reason for Call, type in Other and utilize the format Clearance MM/DD/YY or Clearance TBD. Do not use dashes or single digits. - If request is for dental extraction, please clarify the # of teeth to be extracted.  Request for surgical clearance:  1. What type of surgery is being performed? Carotid Endarterectomy   2. When is this surgery scheduled? 10/08/19  3. What type of clearance is required (medical clearance vs. Pharmacy clearance to hold med vs. Both)? Medical   4. Are there any medications that need to be held prior to surgery and how long? Up to Cardiology   5. Practice name and name of physician performing surgery? Dr. Donnetta Hutching, [facility] [place]   6. What is the office phone number? [phone]   7.   What is the office fax number? [phone]  8.   Anesthesia type (None, local, MAC, general) ?  Office is not sure    Jimmy Duran 09/30/2019, 11:06 AM  _________________________________________________________________   (provider comments below)

## 2019-10-01 ENCOUNTER — Encounter (HOSPITAL_COMMUNITY)
Admission: RE | Admit: 2019-10-01 | Discharge: 2019-10-01 | Disposition: A | Discharge status: Home / Self Care | Payer: Medicare HMO | Source: Ambulatory Visit | Attending: Vascular Surgery | Admitting: Vascular Surgery

## 2019-10-01 ENCOUNTER — Other Ambulatory Visit: Payer: Self-pay

## 2019-10-01 ENCOUNTER — Encounter (HOSPITAL_COMMUNITY): Payer: Self-pay

## 2019-10-01 ENCOUNTER — Telehealth: Payer: Self-pay

## 2019-10-01 DIAGNOSIS — E118 Type 2 diabetes mellitus with unspecified complications: Secondary | ICD-10-CM | POA: Diagnosis not present

## 2019-10-01 DIAGNOSIS — Z01812 Encounter for preprocedural laboratory examination: Secondary | ICD-10-CM | POA: Diagnosis present

## 2019-10-01 DIAGNOSIS — I2510 Atherosclerotic heart disease of native coronary artery without angina pectoris: Secondary | ICD-10-CM | POA: Diagnosis not present

## 2019-10-01 HISTORY — DX: Palpitations: R00.2

## 2019-10-01 LAB — CBC
HCT: 42.8 % (ref 39.0–52.0)
Hemoglobin: 13.9 g/dL (ref 13.0–17.0)
MCH: 32.4 pg (ref 26.0–34.0)
MCHC: 32.5 g/dL (ref 30.0–36.0)
MCV: 99.8 fL (ref 80.0–100.0)
Platelets: 330 10*3/uL (ref 150–400)
RBC: 4.29 MIL/uL (ref 4.22–5.81)
RDW: 13.4 % (ref 11.5–15.5)
WBC: 10 10*3/uL (ref 4.0–10.5)
nRBC: 1.1 % — ABNORMAL HIGH (ref 0.0–0.2)

## 2019-10-01 LAB — URINALYSIS, ROUTINE W REFLEX MICROSCOPIC
Bacteria, UA: NONE SEEN
Bilirubin Urine: NEGATIVE
Glucose, UA: 150 mg/dL — AB
Ketones, ur: NEGATIVE mg/dL
Leukocytes,Ua: NEGATIVE
Nitrite: NEGATIVE
Protein, ur: NEGATIVE mg/dL
Specific Gravity, Urine: 1.023 (ref 1.005–1.030)
pH: 5 (ref 5.0–8.0)

## 2019-10-01 LAB — TYPE AND SCREEN
ABO/RH(D): O POS
Antibody Screen: NEGATIVE

## 2019-10-01 LAB — HEMOGLOBIN A1C
Hgb A1c MFr Bld: 8.9 % — ABNORMAL HIGH (ref 4.8–5.6)
Mean Plasma Glucose: 208.73 mg/dL

## 2019-10-01 LAB — SURGICAL PCR SCREEN
MRSA, PCR: NEGATIVE
Staphylococcus aureus: POSITIVE — AB

## 2019-10-01 LAB — COMPREHENSIVE METABOLIC PANEL
ALT: 31 U/L (ref 0–44)
AST: 19 U/L (ref 15–41)
Albumin: 4.1 g/dL (ref 3.5–5.0)
Alkaline Phosphatase: 79 U/L (ref 38–126)
Anion gap: 12 (ref 5–15)
BUN: 21 mg/dL (ref 8–23)
CO2: 24 mmol/L (ref 22–32)
Calcium: 9.8 mg/dL (ref 8.9–10.3)
Chloride: 102 mmol/L (ref 98–111)
Creatinine, Ser: 1.23 mg/dL (ref 0.61–1.24)
GFR calc Af Amer: >60 mL/min (ref >60)
GFR calc non Af Amer: >60 mL/min (ref >60)
Glucose, Bld: 245 mg/dL — ABNORMAL HIGH (ref 70–99)
Potassium: 3.7 mmol/L (ref 3.5–5.1)
Sodium: 138 mmol/L (ref 135–145)
Total Bilirubin: 0.5 mg/dL (ref 0.3–1.2)
Total Protein: 7.5 g/dL (ref 6.5–8.1)

## 2019-10-01 LAB — GLUCOSE, CAPILLARY: Glucose-Capillary: 285 mg/dL — ABNORMAL HIGH (ref 70–99)

## 2019-10-01 LAB — APTT: aPTT: 26 seconds (ref 24–36)

## 2019-10-01 LAB — PROTIME-INR
INR: 1 (ref 0.8–1.2)
Prothrombin Time: 12.3 seconds (ref 11.4–15.2)

## 2019-10-01 NOTE — Telephone Encounter (Signed)
Christine with preadmission called to report abnormal labs: HgbA1C 8.9 and U/A showed Gluc 150 and Hgb small. Pt scheduled for Rt CEA on 10/08/19.

## 2019-10-01 NOTE — Progress Notes (Signed)
Called in Abnormal A1C of 8.9 , and abnormal U/A to [place], Therapist, sports at Dr. Luther Parody office.

## 2019-10-01 NOTE — Progress Notes (Signed)
PCP - Abran Richard, MD (formerly Joyce Gross, DO) Cardiologist - Satira Sark, MD  PPM/ICD - Denies  Chest x-ray - N/A EKG - 12/01/18 Stress Test - 04/23/13 ECHO - 12/11/18 Cardiac Cath - Per patient, backin 1990s in [place], [place] remember facility; no stent placement.  Sleep Study - Yes, negative for OSA  Fasting Blood Sugar: 200s. Per patient, he is on steroids; last dose yesterday. Checks Blood Sugar 3 times a day. A1C obtained.  Blood Thinner Instructions: N/A Aspirin Instructions: Continue  ERAS Protcol - N/A PRE-SURGERY Ensure or G2- N/A  COVID TEST- 10/07/19 @ [place]   Anesthesia review: Yes, cardiac hx.  Patient denies shortness of breath, fever, cough and chest pain at PAT appointment   All instructions explained to the patient, with a verbal understanding of the material. Patient agrees to go over the instructions while at home for a better understanding. Patient also instructed to self quarantine after being tested for COVID-19. The opportunity to ask questions was provided.

## 2019-10-01 NOTE — Progress Notes (Signed)
   10/01/19 0923  OBSTRUCTIVE SLEEP APNEA  Have you ever been diagnosed with sleep apnea through a sleep study? No  Do you snore loudly (loud enough to be heard through closed doors)?  1  Do you often feel tired, fatigued, or sleepy during the daytime (such as falling asleep during driving or talking to someone)? 0  Has anyone observed you stop breathing during your sleep? 1  Do you have, or are you being treated for high blood pressure? 1  BMI more than 35 kg/m2? 0  Age > 50 (1-yes) 1  Neck circumference greater than:Male 16 inches or larger, Male 17inches or larger? 0  Male Gender (Yes=1) 1  Obstructive Sleep Apnea Score 5

## 2019-10-04 NOTE — Anesthesia Preprocedure Evaluation (Addendum)
Anesthesia Evaluation  Patient identified by MRN, date of birth, ID band Patient awake    Reviewed: Allergy & Precautions, H&P , NPO status , Patient's Chart, lab work & pertinent test results  Airway Mallampati: III  TM Distance: >3 FB Neck ROM: Full    Dental no notable dental hx. (+) Edentulous Upper, Edentulous Lower, Dental Advisory Given   Pulmonary COPD, Current SmokerPatient did not abstain from smoking.,    Pulmonary exam normal breath sounds clear to auscultation       Cardiovascular Exercise Tolerance: Good hypertension, Pt. on medications and Pt. on home beta blockers + CAD and + Peripheral Vascular Disease   Rhythm:Regular Rate:Normal     Neuro/Psych negative neurological ROS  negative psych ROS   GI/Hepatic Neg liver ROS, GERD  Medicated,  Endo/Other  diabetes, Insulin Dependent, Oral Hypoglycemic Agents  Renal/GU negative Renal ROS  negative genitourinary   Musculoskeletal  (+) Arthritis , Osteoarthritis,    Abdominal   Peds  Hematology negative hematology ROS (+)   Anesthesia Other Findings   Reproductive/Obstetrics negative OB ROS                           Anesthesia Physical Anesthesia Plan  ASA: III  Anesthesia Plan: General   Post-op Pain Management:    Induction: Intravenous  PONV Risk Score and Plan: 2 and Ondansetron and Midazolam  Airway Management Planned: Oral ETT  Additional Equipment: Arterial line  Intra-op Plan:   Post-operative Plan: Extubation in OR  Informed Consent: I have reviewed the patients History and Physical, chart, labs and discussed the procedure including the risks, benefits and alternatives for the proposed anesthesia with the patient or authorized representative who has indicated his/her understanding and acceptance.       Plan Discussed with: CRNA and Surgeon  Anesthesia Plan Comments: (PAT note by Karoline Caldwell,  PA-C: Follows with cardiology for history of nonobstructive CAD -  low risk myocardial perfusion study 2015.  LVEF in October 2020 was normal range at 60 to 65%.  Last seen by Dr. Domenic Polite 07/21/2019, stable at that time, recommended 81-month follow-up.  Preop clearance per telephone encounter 09/30/19, "Chart reviewed as part of pre-operative protocol coverage. 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  Remainder of labs unremarkable.  EKG 12/01/2018: NSR.  Rate 93.  Right carotid duplex 09/28/2019: Summary:  Right Carotid: Velocities in the right ICA are consistent with an 80-99% stenosis.  Vertebrals: Right vertebral artery was not visualized.  Subclavians: Normal flow hemodynamics were seen in the right subclavian artery.   TTE 12/11/18: 1. Left ventricular ejection fraction, by visual estimation, is 60 to  65%. The left ventricle has normal function. There is moderately increased  left ventricular hypertrophy.  2. Left ventricular diastolic Doppler parameters are consistent with  impaired relaxation pattern of LV diastolic filling.  3. Global right ventricle has low normal systolic function.The right  ventricular size is normal. No increase in right ventricular wall  thickness.  4. Left atrial size was normal.  5. Right atrial size was normal.  6. Mild mitral annular calcification.  7. Mild to moderate aortic valve annular calcification.  8. The mitral valve is degenerative. No evidence of mitral valve  regurgitation.  9. The tricuspid valve is grossly normal.  Tricuspid valve regurgitation  was not visualized by color flow Doppler.  10. The aortic valve is tricuspid Aortic valve regurgitation was not  visualized by color flow Doppler. Mild aortic valve sclerosis  without  stenosis.  11. The pulmonic valve was not well visualized. Pulmonic valve  regurgitation is not visualized by color flow Doppler.  12. The inferior vena cava is normal in size with greater than 50%  respiratory variability, suggesting right atrial pressure of 3 mmHg.   )      Anesthesia Quick Evaluation

## 2019-10-04 NOTE — Progress Notes (Signed)
Anesthesia Chart Review:  Follows with cardiology for history of nonobstructive CAD -  low risk myocardial perfusion study 2015.  LVEF in October 2020 was normal range at 60 to 65%.  Last seen by Dr. Domenic Polite 07/21/2019, stable at that time, recommended 62-month follow-up.  Preop clearance per telephone encounter 09/30/19, "Chart reviewed as part of pre-operative protocol coverage. 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  Remainder of labs unremarkable.  EKG 12/01/2018: NSR.  Rate 93.  Right carotid duplex 09/28/2019: Summary:  Right Carotid: Velocities in the right ICA are consistent with an 80-99% stenosis.  Vertebrals: Right vertebral artery was not visualized.  Subclavians: Normal flow hemodynamics were seen in the right subclavian artery.   TTE 12/11/18: 1. Left ventricular ejection fraction, by visual estimation, is 60 to  65%. The left ventricle has normal function. There is moderately increased  left ventricular hypertrophy.  2. Left ventricular diastolic Doppler parameters are consistent with  impaired relaxation pattern of LV diastolic filling.  3. Global right ventricle has low normal systolic function.The right  ventricular size is normal. No increase in right ventricular wall  thickness.  4. Left atrial size was normal.  5. Right atrial size was normal.  6. Mild mitral annular calcification.  7. Mild to moderate aortic valve annular calcification.  8. The mitral valve is degenerative. No evidence of mitral valve  regurgitation.  9. The tricuspid valve is grossly normal. Tricuspid valve regurgitation  was not visualized by color flow Doppler.  10. The aortic valve is tricuspid Aortic valve regurgitation was not  visualized by color flow Doppler. Mild aortic  valve sclerosis without  stenosis.  11. The pulmonic valve was not well visualized. Pulmonic valve  regurgitation is not visualized by color flow Doppler.  12. The inferior vena cava is normal in size with greater than 50%  respiratory variability, suggesting right atrial pressure of 3 mmHg.   Wynonia Musty [facility]/Anesthesiology Phone [phone] 10/04/2019 3:35 PM]

## 2019-10-07 ENCOUNTER — Other Ambulatory Visit
Admission: RE | Admit: 2019-10-07 | Discharge: 2019-10-07 | Disposition: A | Discharge status: Home / Self Care | Payer: Medicare HMO | Source: Ambulatory Visit | Attending: Vascular Surgery | Admitting: Vascular Surgery

## 2019-10-07 ENCOUNTER — Other Ambulatory Visit: Payer: Self-pay

## 2019-10-07 DIAGNOSIS — Z20822 Contact with and (suspected) exposure to covid-19: Secondary | ICD-10-CM | POA: Diagnosis not present

## 2019-10-07 DIAGNOSIS — Z01812 Encounter for preprocedural laboratory examination: Secondary | ICD-10-CM | POA: Diagnosis present

## 2019-10-07 LAB — SARS CORONAVIRUS 2 (TAT 6-24 HRS): SARS Coronavirus 2: NEGATIVE

## 2019-10-08 ENCOUNTER — Inpatient Hospital Stay (HOSPITAL_COMMUNITY)
Admission: RE | Admit: 2019-10-08 | Discharge: 2019-10-09 | DRG: 039 | Disposition: A | Discharge status: Home / Self Care | Payer: Medicare HMO | Attending: Vascular Surgery | Admitting: Vascular Surgery

## 2019-10-08 ENCOUNTER — Other Ambulatory Visit: Payer: Self-pay

## 2019-10-08 ENCOUNTER — Inpatient Hospital Stay (HOSPITAL_COMMUNITY): Payer: Medicare HMO | Admitting: Physician Assistant

## 2019-10-08 ENCOUNTER — Inpatient Hospital Stay (HOSPITAL_COMMUNITY): Payer: Medicare HMO

## 2019-10-08 ENCOUNTER — Encounter (HOSPITAL_COMMUNITY): Payer: Self-pay | Admitting: Vascular Surgery

## 2019-10-08 ENCOUNTER — Encounter (HOSPITAL_COMMUNITY)
Admission: RE | Disposition: A | Discharge status: Home / Self Care | Payer: Self-pay | Source: Home / Self Care | Attending: Vascular Surgery

## 2019-10-08 DIAGNOSIS — E782 Mixed hyperlipidemia: Secondary | ICD-10-CM | POA: Diagnosis present

## 2019-10-08 DIAGNOSIS — I6521 Occlusion and stenosis of right carotid artery: Principal | ICD-10-CM | POA: Diagnosis present

## 2019-10-08 DIAGNOSIS — Z794 Long term (current) use of insulin: Secondary | ICD-10-CM

## 2019-10-08 DIAGNOSIS — I714 Abdominal aortic aneurysm, without rupture: Secondary | ICD-10-CM | POA: Diagnosis present

## 2019-10-08 DIAGNOSIS — B029 Zoster without complications: Secondary | ICD-10-CM | POA: Diagnosis present

## 2019-10-08 DIAGNOSIS — E1151 Type 2 diabetes mellitus with diabetic peripheral angiopathy without gangrene: Secondary | ICD-10-CM | POA: Diagnosis present

## 2019-10-08 DIAGNOSIS — K219 Gastro-esophageal reflux disease without esophagitis: Secondary | ICD-10-CM | POA: Diagnosis present

## 2019-10-08 DIAGNOSIS — F17210 Nicotine dependence, cigarettes, uncomplicated: Secondary | ICD-10-CM | POA: Diagnosis present

## 2019-10-08 DIAGNOSIS — Z8673 Personal history of transient ischemic attack (TIA), and cerebral infarction without residual deficits: Secondary | ICD-10-CM

## 2019-10-08 DIAGNOSIS — Z7982 Long term (current) use of aspirin: Secondary | ICD-10-CM

## 2019-10-08 DIAGNOSIS — Z8249 Family history of ischemic heart disease and other diseases of the circulatory system: Secondary | ICD-10-CM | POA: Diagnosis not present

## 2019-10-08 DIAGNOSIS — I10 Essential (primary) hypertension: Secondary | ICD-10-CM | POA: Diagnosis present

## 2019-10-08 DIAGNOSIS — Z20822 Contact with and (suspected) exposure to covid-19: Secondary | ICD-10-CM | POA: Diagnosis present

## 2019-10-08 HISTORY — PX: ENDARTERECTOMY: SHX5162

## 2019-10-08 HISTORY — PX: PATCH ANGIOPLASTY: SHX6230

## 2019-10-08 LAB — POCT I-STAT, CHEM 8
BUN: 19 mg/dL (ref 8–23)
Calcium, Ion: 1.3 mmol/L (ref 1.15–1.40)
Chloride: 106 mmol/L (ref 98–111)
Creatinine, Ser: 1.1 mg/dL (ref 0.61–1.24)
Glucose, Bld: 216 mg/dL — ABNORMAL HIGH (ref 70–99)
HCT: 36 % — ABNORMAL LOW (ref 39.0–52.0)
Hemoglobin: 12.2 g/dL — ABNORMAL LOW (ref 13.0–17.0)
Potassium: 3.4 mmol/L — ABNORMAL LOW (ref 3.5–5.1)
Sodium: 138 mmol/L (ref 135–145)
TCO2: 25 mmol/L (ref 22–32)

## 2019-10-08 LAB — GLUCOSE, CAPILLARY
Glucose-Capillary: 251 mg/dL — ABNORMAL HIGH (ref 70–99)
Glucose-Capillary: 85 mg/dL (ref 70–99)
Glucose-Capillary: 136 mg/dL — ABNORMAL HIGH (ref 70–99)
Glucose-Capillary: 349 mg/dL — ABNORMAL HIGH (ref 70–99)
Glucose-Capillary: 174 mg/dL — ABNORMAL HIGH (ref 70–99)

## 2019-10-08 LAB — ABO/RH: ABO/RH(D): O POS

## 2019-10-08 SURGERY — ENDARTERECTOMY, CAROTID
Anesthesia: General | Site: Neck | Laterality: Right

## 2019-10-08 MED ORDER — PANTOPRAZOLE SODIUM 40 MG PO TBEC
40.0000 mg | DELAYED_RELEASE_TABLET | Freq: Every day | ORAL | Status: DC
  Administered 2019-10-09: 40 mg via ORAL
  Filled 2019-10-08 (×2): qty 1

## 2019-10-08 MED ORDER — ONDANSETRON HCL 4 MG/2ML IJ SOLN
INTRAMUSCULAR | Status: AC
  Filled 2019-10-08: qty 2

## 2019-10-08 MED ORDER — LISINOPRIL 10 MG PO TABS
20.0000 mg | ORAL_TABLET | Freq: Every day | ORAL | Status: DC
  Administered 2019-10-08 – 2019-10-09 (×2): 20 mg via ORAL
  Filled 2019-10-08 (×2): qty 2

## 2019-10-08 MED ORDER — PHENYLEPHRINE HCL-NACL 10-0.9 MG/250ML-% IV SOLN
INTRAVENOUS | Status: DC | PRN
  Administered 2019-10-08: 50 ug/min via INTRAVENOUS

## 2019-10-08 MED ORDER — CLEVIDIPINE BUTYRATE 0.5 MG/ML IV EMUL
INTRAVENOUS | Status: DC | PRN
Start: 2019-10-08 — End: 2019-10-08
  Administered 2019-10-08: 2 mg/h via INTRAVENOUS

## 2019-10-08 MED ORDER — LACTATED RINGERS IV SOLN: INTRAVENOUS | Status: DC

## 2019-10-08 MED ORDER — SUCCINYLCHOLINE CHLORIDE 200 MG/10ML IV SOSY
PREFILLED_SYRINGE | INTRAVENOUS | Status: AC
  Filled 2019-10-08: qty 10

## 2019-10-08 MED ORDER — LISINOPRIL-HYDROCHLOROTHIAZIDE 20-12.5 MG PO TABS: 1.0000 | ORAL_TABLET | Freq: Every day | ORAL | Status: DC

## 2019-10-08 MED ORDER — MIDAZOLAM HCL 2 MG/2ML IJ SOLN
INTRAMUSCULAR | Status: AC
  Filled 2019-10-08: qty 2

## 2019-10-08 MED ORDER — DEXAMETHASONE SODIUM PHOSPHATE 10 MG/ML IJ SOLN
INTRAMUSCULAR | Status: AC
  Filled 2019-10-08: qty 1

## 2019-10-08 MED ORDER — PHENYLEPHRINE 40 MCG/ML (10ML) SYRINGE FOR IV PUSH (FOR BLOOD PRESSURE SUPPORT)
PREFILLED_SYRINGE | INTRAVENOUS | Status: AC
  Filled 2019-10-08: qty 10

## 2019-10-08 MED ORDER — OXYCODONE-ACETAMINOPHEN 5-325 MG PO TABS
1.0000 | ORAL_TABLET | ORAL | Status: DC | PRN
  Administered 2019-10-08: 1 via ORAL
  Filled 2019-10-08: qty 1

## 2019-10-08 MED ORDER — SODIUM CHLORIDE 0.9 % IV SOLN: 500.0000 mL | Freq: Once | INTRAVENOUS | Status: DC | PRN

## 2019-10-08 MED ORDER — METOPROLOL TARTRATE 5 MG/5ML IV SOLN: 2.0000 mg | INTRAVENOUS | Status: DC | PRN

## 2019-10-08 MED ORDER — PROPOFOL 10 MG/ML IV BOLUS
INTRAVENOUS | Status: AC
  Filled 2019-10-08: qty 20

## 2019-10-08 MED ORDER — INSULIN REGULAR(HUMAN) IN NACL 100-0.9 UT/100ML-% IV SOLN
INTRAVENOUS | Status: DC | PRN
Start: 2019-10-08 — End: 2019-10-08
  Administered 2019-10-08: 10.5 [IU]/h via INTRAVENOUS

## 2019-10-08 MED ORDER — HEPARIN SODIUM (PORCINE) 1000 UNIT/ML IJ SOLN
INTRAMUSCULAR | Status: DC | PRN
  Administered 2019-10-08: 7000 [IU] via INTRAVENOUS

## 2019-10-08 MED ORDER — BISACODYL 10 MG RE SUPP: 10.0000 mg | Freq: Every day | RECTAL | Status: DC | PRN

## 2019-10-08 MED ORDER — PHENOL 1.4 % MT LIQD: 1.0000 | OROMUCOSAL | Status: DC | PRN

## 2019-10-08 MED ORDER — ASPIRIN EC 81 MG PO TBEC
81.0000 mg | DELAYED_RELEASE_TABLET | Freq: Every day | ORAL | Status: DC
  Administered 2019-10-09: 81 mg via ORAL
  Filled 2019-10-08 (×2): qty 1

## 2019-10-08 MED ORDER — METOPROLOL TARTRATE 25 MG PO TABS
25.0000 mg | ORAL_TABLET | Freq: Two times a day (BID) | ORAL | Status: DC
  Administered 2019-10-08 – 2019-10-09 (×2): 25 mg via ORAL
  Filled 2019-10-08 (×2): qty 1

## 2019-10-08 MED ORDER — CEFAZOLIN SODIUM-DEXTROSE 2-4 GM/100ML-% IV SOLN
2.0000 g | INTRAVENOUS | Status: AC
  Administered 2019-10-08: 2 g via INTRAVENOUS

## 2019-10-08 MED ORDER — HYDROMORPHONE HCL 1 MG/ML IJ SOLN: 0.2500 mg | INTRAMUSCULAR | Status: DC | PRN

## 2019-10-08 MED ORDER — HYDRALAZINE HCL 20 MG/ML IJ SOLN: 5.0000 mg | INTRAMUSCULAR | Status: DC | PRN

## 2019-10-08 MED ORDER — INSULIN DETEMIR 100 UNIT/ML ~~LOC~~ SOLN
12.0000 [IU] | Freq: Two times a day (BID) | SUBCUTANEOUS | Status: DC
  Administered 2019-10-08 – 2019-10-09 (×2): 12 [IU] via SUBCUTANEOUS
  Filled 2019-10-08 (×3): qty 0.12

## 2019-10-08 MED ORDER — CEFAZOLIN SODIUM-DEXTROSE 2-4 GM/100ML-% IV SOLN
INTRAVENOUS | Status: AC
  Administered 2019-10-09: 2 g via INTRAVENOUS
  Filled 2019-10-08: qty 100

## 2019-10-08 MED ORDER — INSULIN ASPART 100 UNIT/ML ~~LOC~~ SOLN
0.0000 [IU] | Freq: Three times a day (TID) | SUBCUTANEOUS | Status: DC
  Administered 2019-10-08: 11 [IU] via SUBCUTANEOUS
  Administered 2019-10-09: 5 [IU] via SUBCUTANEOUS

## 2019-10-08 MED ORDER — ACETAMINOPHEN 500 MG PO TABS: 1000.0000 mg | ORAL_TABLET | Freq: Once | ORAL | Status: AC

## 2019-10-08 MED ORDER — SODIUM CHLORIDE 0.9 % IV SOLN
INTRAVENOUS | Status: DC | PRN
  Administered 2019-10-08: 500 mL

## 2019-10-08 MED ORDER — LIDOCAINE 2% (20 MG/ML) 5 ML SYRINGE
INTRAMUSCULAR | Status: AC
  Filled 2019-10-08: qty 5

## 2019-10-08 MED ORDER — SODIUM CHLORIDE 0.9 % IV SOLN
0.0125 ug/kg/min | INTRAVENOUS | Status: DC
  Administered 2019-10-08: .2 ug/kg/min via INTRAVENOUS
  Filled 2019-10-08 (×2): qty 2000

## 2019-10-08 MED ORDER — ORAL CARE MOUTH RINSE: 15.0000 mL | Freq: Once | OROMUCOSAL | Status: AC

## 2019-10-08 MED ORDER — DOCUSATE SODIUM 100 MG PO CAPS
100.0000 mg | ORAL_CAPSULE | Freq: Every day | ORAL | Status: DC
  Administered 2019-10-09: 100 mg via ORAL
  Filled 2019-10-08 (×2): qty 1

## 2019-10-08 MED ORDER — HYDROCHLOROTHIAZIDE 12.5 MG PO CAPS
12.5000 mg | ORAL_CAPSULE | Freq: Every day | ORAL | Status: DC
  Administered 2019-10-08 – 2019-10-09 (×2): 12.5 mg via ORAL
  Filled 2019-10-08 (×2): qty 1

## 2019-10-08 MED ORDER — ACETAMINOPHEN 650 MG RE SUPP: 325.0000 mg | RECTAL | Status: DC | PRN

## 2019-10-08 MED ORDER — 0.9 % SODIUM CHLORIDE (POUR BTL) OPTIME
TOPICAL | Status: DC | PRN
  Administered 2019-10-08: 1000 mL

## 2019-10-08 MED ORDER — LIDOCAINE 2% (20 MG/ML) 5 ML SYRINGE
INTRAMUSCULAR | Status: DC | PRN
  Administered 2019-10-08: 60 mg via INTRAVENOUS

## 2019-10-08 MED ORDER — SERTRALINE HCL 50 MG PO TABS
50.0000 mg | ORAL_TABLET | Freq: Every day | ORAL | Status: DC
  Administered 2019-10-09: 50 mg via ORAL
  Filled 2019-10-08 (×2): qty 1

## 2019-10-08 MED ORDER — MORPHINE SULFATE (PF) 2 MG/ML IV SOLN
2.0000 mg | INTRAVENOUS | Status: DC | PRN
  Administered 2019-10-08: 2 mg via INTRAVENOUS
  Filled 2019-10-08: qty 1

## 2019-10-08 MED ORDER — DEXAMETHASONE SODIUM PHOSPHATE 10 MG/ML IJ SOLN
INTRAMUSCULAR | Status: DC | PRN
  Administered 2019-10-08: 4 mg via INTRAVENOUS

## 2019-10-08 MED ORDER — NITROGLYCERIN 0.2 MG/ML ON CALL CATH LAB
INTRAVENOUS | Status: DC | PRN
Start: 2019-10-08 — End: 2019-10-08
  Administered 2019-10-08 (×5): 20 ug via INTRAVENOUS

## 2019-10-08 MED ORDER — CHLORHEXIDINE GLUCONATE 0.12 % MT SOLN: 15.0000 mL | Freq: Once | OROMUCOSAL | Status: AC

## 2019-10-08 MED ORDER — ALUM & MAG HYDROXIDE-SIMETH 200-200-20 MG/5ML PO SUSP: 15.0000 mL | ORAL | Status: DC | PRN

## 2019-10-08 MED ORDER — CEFAZOLIN SODIUM-DEXTROSE 2-4 GM/100ML-% IV SOLN
2.0000 g | Freq: Three times a day (TID) | INTRAVENOUS | Status: AC
  Administered 2019-10-08: 2 g via INTRAVENOUS
  Filled 2019-10-08 (×2): qty 100

## 2019-10-08 MED ORDER — ONDANSETRON HCL 4 MG/2ML IJ SOLN: 4.0000 mg | Freq: Four times a day (QID) | INTRAMUSCULAR | Status: DC | PRN

## 2019-10-08 MED ORDER — EPHEDRINE 5 MG/ML INJ
INTRAVENOUS | Status: AC
  Filled 2019-10-08: qty 10

## 2019-10-08 MED ORDER — PROPOFOL 10 MG/ML IV BOLUS
INTRAVENOUS | Status: DC | PRN
  Administered 2019-10-08: 100 mg via INTRAVENOUS

## 2019-10-08 MED ORDER — FENTANYL CITRATE (PF) 250 MCG/5ML IJ SOLN
INTRAMUSCULAR | Status: AC
  Filled 2019-10-08: qty 5

## 2019-10-08 MED ORDER — ACETAMINOPHEN 500 MG PO TABS
ORAL_TABLET | ORAL | Status: AC
  Administered 2019-10-08: 1000 mg via ORAL
  Filled 2019-10-08: qty 2

## 2019-10-08 MED ORDER — ONDANSETRON HCL 4 MG/2ML IJ SOLN
INTRAMUSCULAR | Status: DC | PRN
  Administered 2019-10-08: 4 mg via INTRAVENOUS

## 2019-10-08 MED ORDER — PHENYLEPHRINE 40 MCG/ML (10ML) SYRINGE FOR IV PUSH (FOR BLOOD PRESSURE SUPPORT)
PREFILLED_SYRINGE | INTRAVENOUS | Status: DC | PRN
  Administered 2019-10-08: 60 ug via INTRAVENOUS
  Administered 2019-10-08: 80 ug via INTRAVENOUS
  Administered 2019-10-08: 60 ug via INTRAVENOUS
  Administered 2019-10-08 (×2): 40 ug via INTRAVENOUS

## 2019-10-08 MED ORDER — ACETAMINOPHEN 325 MG PO TABS: 325.0000 mg | ORAL_TABLET | ORAL | Status: DC | PRN

## 2019-10-08 MED ORDER — MAGNESIUM SULFATE 2 GM/50ML IV SOLN: 2.0000 g | Freq: Every day | INTRAVENOUS | Status: DC | PRN

## 2019-10-08 MED ORDER — SODIUM CHLORIDE 0.9 % IV SOLN: INTRAVENOUS | Status: DC

## 2019-10-08 MED ORDER — CEFAZOLIN SODIUM-DEXTROSE 2-3 GM-%(50ML) IV SOLR: INTRAVENOUS | Status: DC | PRN

## 2019-10-08 MED ORDER — ROCURONIUM BROMIDE 10 MG/ML (PF) SYRINGE
PREFILLED_SYRINGE | INTRAVENOUS | Status: AC
  Filled 2019-10-08: qty 10

## 2019-10-08 MED ORDER — BUPROPION HCL ER (SR) 150 MG PO TB12
150.0000 mg | ORAL_TABLET | Freq: Two times a day (BID) | ORAL | Status: DC
  Administered 2019-10-08 – 2019-10-09 (×2): 150 mg via ORAL
  Filled 2019-10-08 (×2): qty 1

## 2019-10-08 MED ORDER — ROSUVASTATIN CALCIUM 20 MG PO TABS
40.0000 mg | ORAL_TABLET | Freq: Every day | ORAL | Status: DC
  Administered 2019-10-09: 40 mg via ORAL
  Filled 2019-10-08 (×2): qty 2

## 2019-10-08 MED ORDER — ROCURONIUM BROMIDE 100 MG/10ML IV SOLN
INTRAVENOUS | Status: DC | PRN
  Administered 2019-10-08: 60 mg via INTRAVENOUS

## 2019-10-08 MED ORDER — SODIUM CHLORIDE 0.9 % IV SOLN
INTRAVENOUS | Status: AC
  Filled 2019-10-08: qty 1.2

## 2019-10-08 MED ORDER — POTASSIUM CHLORIDE CRYS ER 20 MEQ PO TBCR: 20.0000 meq | EXTENDED_RELEASE_TABLET | Freq: Every day | ORAL | Status: DC | PRN

## 2019-10-08 MED ORDER — SODIUM CHLORIDE 0.9 % IV SOLN
0.0125 ug/kg/min | INTRAVENOUS | Status: DC
  Filled 2019-10-08: qty 2000

## 2019-10-08 MED ORDER — CHLORHEXIDINE GLUCONATE CLOTH 2 % EX PADS: 6.0000 | MEDICATED_PAD | Freq: Once | CUTANEOUS | Status: DC

## 2019-10-08 MED ORDER — METFORMIN HCL 500 MG PO TABS
1000.0000 mg | ORAL_TABLET | Freq: Two times a day (BID) | ORAL | Status: DC
  Administered 2019-10-08 – 2019-10-09 (×2): 1000 mg via ORAL
  Filled 2019-10-08 (×2): qty 2

## 2019-10-08 MED ORDER — POLYETHYLENE GLYCOL 3350 17 G PO PACK: 17.0000 g | PACK | Freq: Every day | ORAL | Status: DC | PRN

## 2019-10-08 MED ORDER — LABETALOL HCL 5 MG/ML IV SOLN: 10.0000 mg | INTRAVENOUS | Status: DC | PRN

## 2019-10-08 MED ORDER — SUGAMMADEX SODIUM 200 MG/2ML IV SOLN
INTRAVENOUS | Status: DC | PRN
  Administered 2019-10-08: 200 mg via INTRAVENOUS

## 2019-10-08 MED ORDER — PROTAMINE SULFATE 10 MG/ML IV SOLN
INTRAVENOUS | Status: DC | PRN
Start: 2019-10-08 — End: 2019-10-08
  Administered 2019-10-08: 50 mg via INTRAVENOUS

## 2019-10-08 MED ORDER — ESMOLOL HCL 100 MG/10ML IV SOLN
INTRAVENOUS | Status: DC | PRN
Start: 2019-10-08 — End: 2019-10-08
  Administered 2019-10-08: 20 mg via INTRAVENOUS
  Administered 2019-10-08 (×2): 40 mg via INTRAVENOUS

## 2019-10-08 MED ORDER — GUAIFENESIN-DM 100-10 MG/5ML PO SYRP: 15.0000 mL | ORAL_SOLUTION | ORAL | Status: DC | PRN

## 2019-10-08 MED ORDER — LIDOCAINE HCL (PF) 1 % IJ SOLN
INTRAMUSCULAR | Status: AC
  Filled 2019-10-08: qty 5

## 2019-10-08 MED ORDER — CHLORHEXIDINE GLUCONATE 0.12 % MT SOLN
OROMUCOSAL | Status: AC
  Administered 2019-10-08: 15 mL via OROMUCOSAL
  Filled 2019-10-08: qty 15

## 2019-10-08 SURGICAL SUPPLY — 40 items
ADH SKN CLS APL DERMABOND .7 (GAUZE/BANDAGES/DRESSINGS) ×1
CANISTER SUCT 3000ML PPV (MISCELLANEOUS) ×2 IMPLANT
CANNULA VESSEL 3MM 2 BLNT TIP (CANNULA) ×4 IMPLANT
CATH ROBINSON RED A/P 18FR (CATHETERS) ×2 IMPLANT
CLIP LIGATING EXTRA MED SLVR (CLIP) ×2 IMPLANT
CLIP LIGATING EXTRA SM BLUE (MISCELLANEOUS) ×2 IMPLANT
COVER WAND RF STERILE (DRAPES) ×1 IMPLANT
DECANTER SPIKE VIAL GLASS SM (MISCELLANEOUS) IMPLANT
DERMABOND ADVANCED (GAUZE/BANDAGES/DRESSINGS) ×1
DERMABOND ADVANCED .7 DNX12 (GAUZE/BANDAGES/DRESSINGS) ×1 IMPLANT
DRAIN HEMOVAC 1/8 X 5 (WOUND CARE) IMPLANT
ELECT REM PT RETURN 9FT ADLT (ELECTROSURGICAL) ×2
ELECTRODE REM PT RTRN 9FT ADLT (ELECTROSURGICAL) ×1 IMPLANT
EVACUATOR SILICONE 100CC (DRAIN) IMPLANT
GLOVE SS BIOGEL STRL SZ 7.5 (GLOVE) ×1 IMPLANT
GLOVE SUPERSENSE BIOGEL SZ 7.5 (GLOVE) ×1
GOWN STRL REUS W/ TWL LRG LVL3 (GOWN DISPOSABLE) ×3 IMPLANT
GOWN STRL REUS W/TWL LRG LVL3 (GOWN DISPOSABLE) ×6
KIT BASIN OR (CUSTOM PROCEDURE TRAY) ×2 IMPLANT
KIT SHUNT ARGYLE CAROTID ART 6 (VASCULAR PRODUCTS) IMPLANT
KIT TURNOVER KIT B (KITS) ×2 IMPLANT
NEEDLE 22X1 1/2 (OR ONLY) (NEEDLE) IMPLANT
NS IRRIG 1000ML POUR BTL (IV SOLUTION) ×4 IMPLANT
PACK CAROTID (CUSTOM PROCEDURE TRAY) ×2 IMPLANT
PAD ARMBOARD 7.5X6 YLW CONV (MISCELLANEOUS) ×4 IMPLANT
PATCH HEMASHIELD 8X75 (Vascular Products) ×1 IMPLANT
POSITIONER HEAD DONUT 9IN (MISCELLANEOUS) ×2 IMPLANT
SHUNT CAROTID BYPASS 10 (VASCULAR PRODUCTS) ×1 IMPLANT
SHUNT CAROTID BYPASS 12FRX15.5 (VASCULAR PRODUCTS) IMPLANT
SUT ETHILON 3 0 PS 1 (SUTURE) IMPLANT
SUT PROLENE 6 0 CC (SUTURE) ×3 IMPLANT
SUT SILK 3 0 (SUTURE)
SUT SILK 3-0 18XBRD TIE 12 (SUTURE) IMPLANT
SUT VIC AB 3-0 SH 27 (SUTURE) ×4
SUT VIC AB 3-0 SH 27X BRD (SUTURE) ×2 IMPLANT
SUT VICRYL 4-0 PS2 18IN ABS (SUTURE) ×2 IMPLANT
SYR CONTROL 10ML LL (SYRINGE) IMPLANT
SYR TOOMEY 50ML (SYRINGE) ×1 IMPLANT
TOWEL GREEN STERILE (TOWEL DISPOSABLE) ×2 IMPLANT
WATER STERILE IRR 1000ML POUR (IV SOLUTION) ×2 IMPLANT

## 2019-10-08 NOTE — Interval H&P Note (Signed)
History and Physical Interval Note:  10/08/2019 7:05 AM  Jimmy Duran  has presented today for surgery, with the diagnosis of RIGHT CAROTID STENOSIS.  The various methods of treatment have been discussed with the patient and family. After consideration of risks, benefits and other options for treatment, the patient has consented to  Procedure(s): ENDARTERECTOMY CAROTID (Right) as a surgical intervention.  The patient's history has been reviewed, patient examined, no change in status, stable for surgery.  I have reviewed the patient's chart and labs.  Questions were answered to the patient's satisfaction.     Curt Jews

## 2019-10-08 NOTE — Transfer of Care (Signed)
Immediate Anesthesia Transfer of Care Note  Patient: Jimmy Duran  Procedure(s) Performed: RIGHT CAROTID ENDARTERECTOMY (Right Neck) PATCH ANGIOPLASTY of the right common carotid artery using hemashield plaltinum finesse patch (Right Neck)  Patient Location: PACU  Anesthesia Type:General  Level of Consciousness: awake, alert  and oriented  Airway & Oxygen Therapy: Patient Spontanous Breathing and Patient connected to nasal cannula oxygen  Post-op Assessment: Report given to RN, Post -op Vital signs reviewed and stable, Patient moving all extremities X 4 and Patient able to stick tongue midline  Post vital signs: Reviewed and stable  Last Vitals:  Vitals Value Taken Time  BP 135/84 10/08/19 1000  Temp    Pulse 94 10/08/19 1000  Resp 16 10/08/19 1000  SpO2 96 % 10/08/19 1000  Vitals shown include unvalidated device data.  Last Pain:  Vitals:   10/08/19 0618  TempSrc: Oral  PainSc: 0-No pain      Patients Stated Pain Goal: 5 (82/64/15 8309)  Complications: No complications documented.

## 2019-10-08 NOTE — Discharge Instructions (Signed)
   [facility]  Discharge Instructions   Carotid Surgery  Please refer to the following instructions for your post-procedure care. Your surgeon or physician assistant will discuss any changes with you.  Activity  You are encouraged to walk as much as you can. You can slowly return to normal activities but must avoid strenuous activity and heavy lifting until your doctor tell you it's okay. Avoid activities such as vacuuming or swinging a golf club. You can drive after one week if you are comfortable and you are no longer taking prescription pain medications. It is normal to feel tired for serval weeks after your surgery. It is also normal to have difficulty with sleep habits, eating, and bowel movements after surgery. These will go away with time.  Bathing/Showering  Shower daily after you go home. Do not soak in a bathtub, hot tub, or swim until the incision heals completely.  Incision Care  Shower every day. Clean your incision with mild soap and water. Pat the area dry with a clean towel. You do not need a bandage unless otherwise instructed. Do not apply any ointments or creams to your incision. You may have skin glue on your incision. Do not peel it off. It will come off on its own in about one week. Your incision may feel thickened and raised for several weeks after your surgery. This is normal and the skin will soften over time.   For Men Only: It's okay to shave around the incision but do not shave the incision itself for 2 weeks. It is common to have numbness under your chin that could last for several months.  Diet  Resume your normal diet. There are no special food restrictions following this procedure. A low fat/low cholesterol diet is recommended for all patients with vascular disease. In order to heal from your surgery, it is CRITICAL to get adequate nutrition. Your body requires vitamins, minerals, and protein. Vegetables are the best source of  vitamins and minerals. Vegetables also provide the perfect balance of protein. Processed food has little nutritional value, so try to avoid this.  Medications  Resume taking all of your medications unless your doctor or physician assistant tells you not to. If your incision is causing pain, you may take over-the- counter pain relievers such as acetaminophen (Tylenol). If you were prescribed a stronger pain medication, please be aware these medications can cause nausea and constipation. Prevent nausea by taking the medication with a snack or meal. Avoid constipation by drinking plenty of fluids and eating foods with a high amount of fiber, such as fruits, vegetables, and grains.   Do not take Tylenol if you are taking prescription pain medications.  Follow Up  Our office will schedule a follow up appointment 2-3 weeks following discharge.  Please call us immediately for any of the following conditions  . Increased pain, redness, drainage (pus) from your incision site. . Fever of 101 degrees or higher. . If you should develop stroke (slurred speech, difficulty swallowing, weakness on one side of your body, loss of vision) you should call 911 and go to the nearest emergency room. .  Reduce your risk of vascular disease:  . Stop smoking. If you would like help call [facility] at 1-800-QUIT-NOW (1-800-784-8669) or [facility] at 336-586-4000. . Manage your cholesterol . Maintain a desired weight . Control your diabetes . Keep your blood pressure down .  If you have any questions, please call the office at [phone]. 

## 2019-10-08 NOTE — Progress Notes (Signed)
  Day of Surgery Note    Subjective:  No complaints; resting comfortably in recovery   Vitals:   10/08/19 1015 10/08/19 1030  BP: 125/81 (!) 144/72  Pulse: 94 97  Resp: 15 (!) 21  Temp:    SpO2: 93% 96%    Incisions:   Clean and dry Extremities:  Moving all extremities equally Cardiac:  regular Lungs:  Non labored Neuro:  In tact; tongue is midline   Assessment/Plan:  This is a 64 y.o. male who is s/p  Right carotid endarterectomy  -pt doing well in recovery and neuro in tact and hemodynamically stable not requiring any gtts. -to [place] later this morning -if uneventful day today/tonight, anticipate discharge tomorrow. -SSI for DM    Leontine Locket, PA-C 10/08/2019 10:36 AM [phone]

## 2019-10-08 NOTE — Progress Notes (Addendum)
Inpatient Diabetes Program Recommendations  AACE/ADA: New Consensus Statement on Inpatient Glycemic Control (2015)  Target Ranges:  Prepandial:   less than 140 mg/dL      Peak postprandial:   less than 180 mg/dL (1-2 hours)      Critically ill patients:  140 - 180 mg/dL   Lab Results  Component Value Date   GLUCAP 136 (H) 10/08/2019   HGBA1C 8.9 (H) 10/01/2019    Review of Glycemic Control Results for VASILIOS, OTTAWAY (MRN [id number]) as of 10/08/2019 13:06  Ref. Range 10/08/2019 06:24 10/08/2019 10:00 10/08/2019 11:23  Glucose-Capillary Latest Ref Range: 70 - 99 mg/dL 251 (H) 85 136 (H)   Diabetes history: DM 2 Outpatient Diabetes medications:  NPH 25 units bid, Prednisone 60 mg daily, Metformin 1000 mg bid Current orders for Inpatient glycemic control:  Novolog moderate tid with meals  Inpatient Diabetes Program Recommendations:    Note that patient was on basal insulin, NPH prior to admit.  Consider adding adding Levemir 12 units bid while patient is in the hospital (1/2 of home insulin).    Thanks  Adah Perl, RN, BC-ADM Inpatient Diabetes Coordinator Pager [phone] (8a-5p)

## 2019-10-08 NOTE — Anesthesia Postprocedure Evaluation (Signed)
Anesthesia Post Note  Patient: Jimmy Duran  Procedure(s) Performed: RIGHT CAROTID ENDARTERECTOMY (Right Neck) PATCH ANGIOPLASTY of the right common carotid artery using hemashield plaltinum finesse patch (Right Neck)     Patient location during evaluation: PACU Anesthesia Type: General Level of consciousness: awake and alert Pain management: pain level controlled Vital Signs Assessment: post-procedure vital signs reviewed and stable Respiratory status: spontaneous breathing, nonlabored ventilation, respiratory function stable and patient connected to nasal cannula oxygen Cardiovascular status: blood pressure returned to baseline and stable Postop Assessment: no apparent nausea or vomiting Anesthetic complications: no   No complications documented.  Last Vitals:  Vitals:   10/08/19 1015 10/08/19 1030  BP: 125/81 (!) 144/72  Pulse: 94 97  Resp: 15 (!) 21  Temp:    SpO2: 93% 96%    Last Pain:  Vitals:   10/08/19 1030  TempSrc:   PainSc: Asleep                 ,W. EDMOND

## 2019-10-08 NOTE — Progress Notes (Signed)
CBG in Pre-op 251. Dr. Chilton Si notified. Verbal order received for Endo Tool. Heather, CRNA notified who stated she will start Endo tool back in surgery.

## 2019-10-08 NOTE — Anesthesia Procedure Notes (Signed)
Procedure Name: Intubation Date/Time: 10/08/2019 7:36 AM Performed by: Wilburn Cornelia, CRNA Pre-anesthesia Checklist: Patient identified, Emergency Drugs available, Suction available and Patient being monitored Patient Re-evaluated:Patient Re-evaluated prior to induction Oxygen Delivery Method: Circle System Utilized Preoxygenation: Pre-oxygenation with 100% oxygen Induction Type: IV induction Ventilation: Oral airway inserted - appropriate to patient size and Two handed mask ventilation required Laryngoscope Size: Mac and 3 Grade View: Grade I Tube type: Oral Tube size: 7.5 mm Number of attempts: 1 Airway Equipment and Method: Stylet and Oral airway Placement Confirmation: ETT inserted through vocal cords under direct vision,  positive ETCO2 and breath sounds checked- equal and bilateral Secured at: 20 cm Tube secured with: Tape Dental Injury: Teeth and Oropharynx as per pre-operative assessment

## 2019-10-08 NOTE — Op Note (Signed)
   OPERATIVE REPORT  DATE OF SURGERY: 10/08/2019  PATIENT: Jimmy Duran, 64 y.o. male MRN: [id number]  DOB: [birth year]  PRE-OPERATIVE DIAGNOSIS: Right Carotid Stenosis, Asymptomatic  POST-OPERATIVE DIAGNOSIS:  Same  PROCEDURE:  Right Carotid Endarterectomy with Dacron Patch Angioplasty  SURGEON:  Curt Jews, M.D.  PHYSICIAN ASSISTANT: Rhyne PAC  The assistant was needed for exposure and to expedite the case  ANESTHESIA:   general  EBL: Less than 200 ml  Total I/O In: 1600 [I.V.:1500; IV Piggyback:100] Out: 50 [Blood:50]  BLOOD ADMINISTERED: none  DRAINS: none   SPECIMEN: none  COUNTS CORRECT:  YES  PLAN OF CARE: Admit to inpatient   PATIENT DISPOSITION:  PACU - hemodynamically stable and neurologically intact.  PROCEDURE DETAILS: The patient was taken to the operating room placed in supine position.  General anesthesia was administered.  The neck was prepped and draped in the usual sterile fashion.  An incision was made anterior to the sternocleidomastoid and carried down through the platysma with electrocautery.  The sternocleidomastoid was reflected posteriorly and the carotid sheath was opened.  The facial vein was ligated with 2-0 silk ties and divided.  The common carotid artery was encircled with an umbilical tape and Rummel tourniquet.  The vagus nerve was identified and preserved.  Dissection was continued onto the carotid bifurcation.  The superior thyroid artery was encircled with a 2-0 silk Potts tie.  The external carotid was encircled with a blue vessel loop and the internal carotid was encircled with an umbilical tape and Rummel tourniquet.  The hypoglossal nerve was identified and preserved.  The patient was given systemic heparin and after adequate circulation time, the internal, external and common carotid arteries were occluded with vascular clamps.  The common carotid artery was opened with an 11 blade and extended  longitudinally with Potts scissors.  A  10 shunt was passed up the internal carotid and allowed to backbleed.  It was then passed down the common carotid where it was secured with Rummel tourniquet.  The endarterectomy was begun on the common carotid artery and the plaque was divided proximally with Potts scissors.  The endarterectomy was continued onto the bifurcation.  The external carotid was endarterectomized with an eversion technique and the internal carotid was endarterectomized in an open fashion.  Remaining atheromatous debris was removed from the endarterectomy plane.  A Finesse Hemashield Dacron patch was brought onto the field and was sewn as a patch angioplasty with a running 6-0 Prolene suture.  Prior to completion of the closure the shunt was removed and the usual flushing maneuvers were undertaken.  The anastomosis was completed and flow was restored first to the external and then the internal carotid artery.  Excellent flow characteristics were noted with hand-held Doppler in the internal and external carotid arteries.  The patient was given 50 mg of protamine to reverse the heparin.  The wounds were irrigated with saline.  Hemostasis was obtained with electrocautery.  The wounds were closed with 3-0 Vicryl to reapproximate the sternocleidomastoid over the carotid sheath.  The platysma was lysed with a running 3-0 Vicryl suture.  The skin was closed with a 4-0 subcuticular Vicryl stitch.  Dermabond was applied.  The patient was awakened neurologically intact in the operating room and transferred to the recovery room in stable condition   Curt Jews, M.D. 10/08/2019 9:59 AM

## 2019-10-08 NOTE — Anesthesia Procedure Notes (Signed)
Arterial Line Insertion Start/End8/08/2019 6:58 AM, 10/08/2019 7:10 AM Performed by: Harden Mo, CRNA, CRNA  Patient location: Pre-op. Preanesthetic checklist: patient identified, IV checked, site marked, risks and benefits discussed, surgical consent, monitors and equipment checked, pre-op evaluation, timeout performed and anesthesia consent Lidocaine 1% used for infiltration Left, radial was placed Catheter size: 20 G Hand hygiene performed  and maximum sterile barriers used  Allen's test indicative of satisfactory collateral circulation Attempts: 2 Procedure performed without using ultrasound guided technique. Following insertion, Biopatch and dressing applied. Post procedure assessment: normal  Patient tolerated the procedure well with no immediate complications.

## 2019-10-09 LAB — CBC
HCT: 32.2 % — ABNORMAL LOW (ref 39.0–52.0)
Hemoglobin: 10.7 g/dL — ABNORMAL LOW (ref 13.0–17.0)
MCH: 33 pg (ref 26.0–34.0)
MCHC: 33.2 g/dL (ref 30.0–36.0)
MCV: 99.4 fL (ref 80.0–100.0)
Platelets: 215 10*3/uL (ref 150–400)
RBC: 3.24 MIL/uL — ABNORMAL LOW (ref 4.22–5.81)
RDW: 13.9 % (ref 11.5–15.5)
WBC: 8.2 10*3/uL (ref 4.0–10.5)
nRBC: 0 % (ref 0.0–0.2)

## 2019-10-09 LAB — BASIC METABOLIC PANEL
Anion gap: 8 (ref 5–15)
BUN: 14 mg/dL (ref 8–23)
CO2: 23 mmol/L (ref 22–32)
Calcium: 8.5 mg/dL — ABNORMAL LOW (ref 8.9–10.3)
Chloride: 104 mmol/L (ref 98–111)
Creatinine, Ser: 0.88 mg/dL (ref 0.61–1.24)
GFR calc Af Amer: >60 mL/min (ref >60)
GFR calc non Af Amer: >60 mL/min (ref >60)
Glucose, Bld: 151 mg/dL — ABNORMAL HIGH (ref 70–99)
Potassium: 3.7 mmol/L (ref 3.5–5.1)
Sodium: 135 mmol/L (ref 135–145)

## 2019-10-09 LAB — GLUCOSE, CAPILLARY: Glucose-Capillary: 222 mg/dL — ABNORMAL HIGH (ref 70–99)

## 2019-10-09 MED ORDER — OXYCODONE-ACETAMINOPHEN 5-325 MG PO TABS: 1.0000 | ORAL_TABLET | Freq: Four times a day (QID) | ORAL | 0 refills | Status: DC | PRN

## 2019-10-09 NOTE — Discharge Summary (Signed)
Discharge Summary     Jimmy Duran [birth year] 64 y.o. male  [id number]  Admission Date: 10/08/2019  Discharge Date: 10/09/2019  Physician: Rosetta Posner, MD  Admission Diagnosis: Asymptomatic carotid artery stenosis without infarction, right [I65.21]   HPI:   This is a 64 y.o. male who is here today with his wife for discussion of carotid disease.  He has known history of moderate stenosis which is been followed by ultrasound over the years.  He recently had repeat duplex showing progression to critical disease in his right internal carotid artery.  He has had episode of numbness in his left hand over the last several months.  He reports initially this was thumb and index finger and is now his whole hand.  It is difficult to know whether this is a central issue.  He does not have any cervical disease.  He denies any weakness in his left hand.  Has had no left leg issues.  He is right-handed.  He does have a long history of cigarette smoking.  He is try to drop back and is now smoking half pack cigarettes per day.  He had a CT scan of his chest, abdomen and pelvis in 2018.  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  This was not flow-limiting.  I am unable to see his films from 2011 but the report from 2018 with this was unchanged.  He had no prior symptoms and this was an incidental finding.  Hospital Course:  The patient was admitted to the hospital and taken to the operating room on 10/08/2019 and underwent right carotid endarterectomy.    The pt tolerated the procedure well and was transported to the PACU in good condition.   By POD 1, the pt neuro status was in tact.  He is able to void and swallowing without difficulty.    The remainder of the hospital course consisted of increasing mobilization and increasing intake of solids without difficulty.   Recent Labs    10/08/19 0825 10/09/19 0729  NA 138 135  K 3.4* 3.7  CL 106 104  CO2  --   23  GLUCOSE 216* 151*  BUN 19 14  CALCIUM  --  8.5*   Recent Labs    10/08/19 0825 10/09/19 0729  WBC  --  8.2  HGB 12.2* 10.7*  HCT 36.0* 32.2*  PLT  --  215   No results for input(s): INR in the last 72 hours.   Discharge Instructions    Discharge patient   Complete by: As directed    Discharge home after pt has walked in the halls and has been seen by Dr. Trula Slade.   Discharge disposition: 01-Home or Self Care   Discharge patient date: 10/09/2019      Discharge Diagnosis:  Asymptomatic carotid artery stenosis without infarction, right [I65.21]  Secondary Diagnosis: Patient Active Problem List   Diagnosis Date Noted  . Asymptomatic carotid artery stenosis without infarction, right 10/08/2019  . Abdominal pain, left lower quadrant 06/18/2018  . History of colonic polyps 11/05/2017  . Diarrhea 08/15/2014  . Dizziness 01/04/2014  . Transient ischemic attack 01/04/2014  . Palpitations 04/22/2013  . Esophageal dysphagia 07/02/2012  . OA (osteoarthritis) of knee 06/17/2011  . GERD (gastroesophageal reflux disease) 05/04/2010  . Diabetes ([place]) 05/04/2010  . COPD (chronic obstructive pulmonary disease) ([place]) 05/04/2010  . Hemorrhoids 05/04/2010  . Mixed hyperlipidemia 04/25/2010  . Tobacco use disorder 04/25/2010  .  Essential hypertension, benign 04/25/2010  . CORONARY ATHEROSCLEROSIS NATIVE CORONARY ARTERY 04/25/2010  . CAROTID ARTERY DISEASE 04/25/2010   Past Medical History:  Diagnosis Date  . AAA (abdominal aortic aneurysm) ([place])   . Arthritis   . Carotid artery disease ([place])   . Colon polyp   . COPD (chronic obstructive pulmonary disease) ([place])   . Coronary artery disease    Nonobstructive  . Essential hypertension   . GERD (gastroesophageal reflux disease)   . Hemorrhoids   . History of TIA (transient ischemic attack)   . Low back pain   . Lumbar radiculopathy   . Mixed hyperlipidemia   . Palpitations   . Peripheral vascular disease ([place])   .  Shingles 09/23/2019  . Type 2 diabetes mellitus ([facility])     Allergies as of 10/09/2019   No Known Allergies     Medication List    STOP taking these medications   predniSONE 20 MG tablet Commonly known as: DELTASONE     TAKE these medications   aspirin EC 81 MG tablet Take 81 mg by mouth daily.   BC HEADACHE POWDER PO Take 1 Package by mouth daily as needed (headache).   buPROPion 150 MG 12 hr tablet Commonly known as: WELLBUTRIN SR Take 150 mg by mouth 2 (two) times daily.   diphenhydrAMINE 25 MG tablet Commonly known as: BENADRYL Take 25 mg by mouth daily as needed for allergies.   lisinopril-hydrochlorothiazide 20-12.5 MG tablet Commonly known as: Zestoretic Take 1 tablet by mouth daily.   metFORMIN 1000 MG tablet Commonly known as: GLUCOPHAGE Take 1,000 mg by mouth 2 (two) times daily with a meal.   metoprolol tartrate 25 MG tablet Commonly known as: LOPRESSOR Take 1 tablet (25 mg total) by mouth 2 (two) times daily.   NovoLIN N 100 UNIT/ML injection Generic drug: insulin NPH Human Inject 25 Units into the skin 2 (two) times daily before a meal.   oxyCODONE-acetaminophen 5-325 MG tablet Commonly known as: Percocet Take 1 tablet by mouth every 6 (six) hours as needed.   pantoprazole 40 MG tablet Commonly known as: PROTONIX Take 1 tablet (40 mg total) by mouth daily.   rosuvastatin 40 MG tablet Commonly known as: CRESTOR Take 40 mg by mouth daily.   sertraline 50 MG tablet Commonly known as: ZOLOFT Take 50 mg by mouth daily.   valACYclovir 1000 MG tablet Commonly known as: VALTREX Take 1,000 mg by mouth 3 (three) times daily. Take for 7 days then stop        [facility] Discharge Instructions Carotid Endarterectomy (CEA)  Please refer to the following instructions for your post-procedure care. Your surgeon or physician assistant will discuss any changes with you.  Activity  You are encouraged to walk as much as  you can. You can slowly return to normal activities but must avoid strenuous activity and heavy lifting until your doctor tell you it's OK. Avoid activities such as vacuuming or swinging a golf club. You can drive after one week if you are comfortable and you are no longer taking prescription pain medications. It is normal to feel tired for serval weeks after your surgery. It is also normal to have difficulty with sleep habits, eating, and bowel movements after surgery. These will go away with time.  Bathing/Showering  You may shower after you come home. Do not soak in a bathtub, hot tub, or swim until the incision heals completely.  Incision Care  Shower every day. Clean  your incision with mild soap and water. Pat the area dry with a clean towel. You do not need a bandage unless otherwise instructed. Do not apply any ointments or creams to your incision. You may have skin glue on your incision. Do not peel it off. It will come off on its own in about one week. Your incision may feel thickened and raised for several weeks after your surgery. This is normal and the skin will soften over time. For Men Only: It's OK to shave around the incision but do not shave the incision itself for 2 weeks. It is common to have numbness under your chin that could last for several months.  Diet  Resume your normal diet. There are no special food restrictions following this procedure. A low fat/low cholesterol diet is recommended for all patients with vascular disease. In order to heal from your surgery, it is CRITICAL to get adequate nutrition. Your body requires vitamins, minerals, and protein. Vegetables are the best source of vitamins and minerals. Vegetables also provide the perfect balance of protein. Processed food has little nutritional value, so try to avoid this.  Medications  Resume taking all of your medications unless your doctor or physician assistant tells you not to.  If your incision is causing pain,  you may take over-the- counter pain relievers such as acetaminophen (Tylenol). If you were prescribed a stronger pain medication, please be aware these medications can cause nausea and constipation.  Prevent nausea by taking the medication with a snack or meal. Avoid constipation by drinking plenty of fluids and eating foods with a high amount of fiber, such as fruits, vegetables, and grains.  Do not take Tylenol if you are taking prescription pain medications.  Follow Up  Our office will schedule a follow up appointment 2-3 weeks following discharge.  Please call us immediately for any of the following conditions  . Increased pain, redness, drainage (pus) from your incision site. . Fever of 101 degrees or higher. . If you should develop stroke (slurred speech, difficulty swallowing, weakness on one side of your body, loss of vision) you should call 911 and go to the nearest emergency room. .  Reduce your risk of vascular disease:  . Stop smoking. If you would like help call [facility] at 1-800-QUIT-NOW (775) 460-1814) or [place] at 408-663-8830. . Manage your cholesterol . Maintain a desired weight . Control your diabetes . Keep your blood pressure down .  If you have any questions, please call the office at [phone].  Prescriptions given: 1.   Roxicet #8 No Refill  Disposition: home  Patient's condition: is Good  Follow up: 1. Dr. Donnetta Hutching in 4 weeks in [place] office   Leontine Locket, [place] [facility] [phone]   --- For [facility] use ---   Modified Rankin score at D/C (0-6): 0  IV medication needed for:  1. Hypertension: No 2. Hypotension: No  Post-op Complications: No  1. Post-op CVA or TIA: No  If yes: Event classification (right eye, left eye, right cortical, left cortical, verterobasilar, other): n/a  If yes: Timing of event (intra-op, <6 hrs post-op, >=6 hrs post-op, unknown): n/a  2. CN injury: No  If yes: CN n/a  injuried   3. Myocardial infarction: No  If yes: Dx by (EKG or clinical, Troponin): n/a  4.  CHF: No  5.  Dysrhythmia (new): No  6. Wound infection: No  7. Reperfusion symptoms: No  8. Return to OR: No  If yes:  return to OR for (bleeding, neurologic, other CEA incision, other): n/a  Discharge medications: Statin use:  Yes ASA use:  Yes   Beta blocker use:  Yes ACE-Inhibitor use:  Yes  ARB use:  No CCB use: No P2Y12 Antagonist use: No, [ ]  Plavix, [ ]  Plasugrel, [ ]  Ticlopinine, [ ]  Ticagrelor, [ ]  Other, [ ]  No for medical reason, [ ]  Non-compliant, [ ]  Not-indicated Anti-coagulant use:  No, [ ]  Warfarin, [ ]  Rivaroxaban, [ ]  Dabigatran,

## 2019-10-09 NOTE — Progress Notes (Addendum)
Carotid Progress Note    10/09/2019 8:10 AM 1 Day Post-Op  Subjective:  Says he feels great and wants to go home. His young sons are outside waiting.  Afebrile HR 70's-80's NSR 70'W-237'S systolic 28% RA  Gtts:  none Vitals:   10/09/19 0355 10/09/19 0752  BP: 113/77 (!) 141/81  Pulse: 80 74  Resp: (!) 22 14  Temp: 98 F (36.7 C) 98.1 F (36.7 C)  SpO2: 95% 97%     Physical Exam: Neuro:  In tact; tongue is midline Lungs:  Non labored Incision:  Clean and dry without hematoma  CBC    Component Value Date/Time   WBC 8.2 10/09/2019 0729   RBC 3.24 (L) 10/09/2019 0729   HGB 10.7 (L) 10/09/2019 0729   HCT 32.2 (L) 10/09/2019 0729   PLT 215 10/09/2019 0729   MCV 99.4 10/09/2019 0729   MCH 33.0 10/09/2019 0729   MCHC 33.2 10/09/2019 0729   RDW 13.9 10/09/2019 0729   LYMPHSABS 1.6 01/28/2018 1220   MONOABS 0.6 01/28/2018 1220   EOSABS 0.2 01/28/2018 1220   BASOSABS 0.1 01/28/2018 1220    BMET    Component Value Date/Time   NA 138 10/08/2019 0825   K 3.4 (L) 10/08/2019 0825   CL 106 10/08/2019 0825   CO2 24 10/01/2019 0945   GLUCOSE 216 (H) 10/08/2019 0825   BUN 19 10/08/2019 0825   CREATININE 1.10 10/08/2019 0825   CALCIUM 9.8 10/01/2019 0945   GFRNONAA >60 10/01/2019 0945   GFRAA >60 10/01/2019 0945     Intake/Output Summary (Last 24 hours) at 10/09/2019 0810 Last data filed at 10/09/2019 0749 Gross per 24 hour  Intake 1750.98 ml  Output 2575 ml  Net -824.02 ml     Assessment/Plan:  This is a 64 y.o. male who is s/p right CEA 1 Day Post-Op  -pt is doing well this am. -pt neuro exam is in tact -pt has not ambulated -pt has voided -f/u with Dr. Donnetta Hutching in 3-4 weeks-pt lives in [place] so will try to get f/u appt set up there.    Leontine Locket, PA-C [facility] [phone]  I agree with the above.  I have seen and evaluated the patient.  He is status post endarterectomy for asymptomatic stenosis.  He is neurologically intact.   His incision looks great.  He is stable for discharge  Annamarie Major

## 2019-10-09 NOTE — Progress Notes (Signed)
Discharge instructions provided to patient. Medications and follow-up care reviewed. All questions answered. IVs removed. Patient to be escorted home by wife.

## 2019-10-11 ENCOUNTER — Encounter (HOSPITAL_COMMUNITY): Payer: Self-pay | Admitting: Vascular Surgery

## 2019-10-20 NOTE — Progress Notes (Signed)
Cardiology Office Note  Date: 10/21/2019   ID: Jimmy Duran, DOB [birth year], MRN [id number]  PCP:  Gara Kroner, DO  Cardiologist:  [name], MD Electrophysiologist:  None   Chief Complaint: CAD  History of Present Illness: Jimmy Duran is a 64 y.o. male with a history of CAD, carotid artery stenosis, abdominal aortic aneurysm, PVD, history of TIA, HTN, COPD, tobacco abuse, GERD, type II DM, HLD.  Last encounter 07/21/2019 via telemedicine with Dr. Domenic Polite. He did not describe any exertional chest pain or shortness of breath. Blood pressure was elevated but he had taken the measurement prior to taking his medications. He stated his systolic was usually in the one twenties to the one thirties. 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 He did not describe any active angina. Plan was for continuing medical therapy and observation. He was to continue aspirin, Zestoretic, Lopressor, and Crestor. His previous LDL was 45  In the interim since last visit he had a right carotid endarterectomy with Dacron patch angioplasty on 10/08/2019 by Dr. Donnetta Hutching.  Site is clean and dry with some scabbing noted in the area.  Patient states he feels much better since his surgery.  Blood pressure is well controlled today.  He is currently taking lisinopril HCTZ 20/12.5 mg, metoprolol 25 mg p.o. twice daily.  Patient denies any neurologic sequelae from the right carotid endarterectomy.  No CVA or TIA-like symptoms.  States he does have some blurred visions but believes it is related to his diabetes.  He states his diabetes has not been well controlled recently.  He continues to smoke 1 pack of cigarettes per day.  He stated Dr. Donnetta Hutching told him it was okay to smoke 1/2 pack/day as long as he did not increase the amount of smoking.  Back up to a pack per day.  He denies any anginal or exertional  symptoms, palpitations or arrhythmias, orthostatic symptoms, CVA/TIA like symptoms, bleeding, claudication-like symptoms, DVT or PE-like symptoms, or lower extremity edema.  He is asking for ED medications.  States he is having problems with erectile dysfunction.  Past Medical History:  Diagnosis Date  . AAA (abdominal aortic aneurysm) ([place])   . Arthritis   . Carotid artery disease ([place])   . Colon polyp   . COPD (chronic obstructive pulmonary disease) ([place])   . Coronary artery disease    Nonobstructive  . Essential hypertension   . GERD (gastroesophageal reflux disease)   . Hemorrhoids   . History of TIA (transient ischemic attack)   . Low back pain   . Lumbar radiculopathy   . Mixed hyperlipidemia   . Palpitations   . Peripheral vascular disease ([place])   . Shingles 09/23/2019  . Type 2 diabetes mellitus ([place])     Past Surgical History:  Procedure Laterality Date  . APPENDECTOMY  1970  . BIOPSY  12/08/2017   Procedure: BIOPSY;  Surgeon: [name], MD;  [facility];  Service: Endoscopy;;  esophagus   . BREAST SURGERY Right    Cyst resection on the right  . CARDIAC CATHETERIZATION  1990's   in [place]. no stent placement  . CATARACT EXTRACTION W/PHACO Right 06/18/2016   Procedure: CATARACT EXTRACTION PHACO AND INTRAOCULAR LENS PLACEMENT (IOC);  Surgeon: [name], MD;  Location: [facility] ORS;  Service: Ophthalmology;  Laterality: Right;  [name] 35.7 AP% 16.6 CDE 5.94 Fluid pack lot #  [id number]  . CATARACT EXTRACTION W/PHACO Left 07/16/2016   Procedure: CATARACT EXTRACTION PHACO AND INTRAOCULAR LENS PLACEMENT (IOC);  Surgeon: [name], MD;  Location: [facility] ORS;  Service: Ophthalmology;  Laterality: Left;  [name] 51.8 AP% 12.7 CDE 6.58 Fluid Pack Lot # [id number]  . CHOLECYSTECTOMY  1993  . COLONOSCOPY  02/12/2007   TDV:VOHYWVPXT friable anal canal hemorrhoids, otherwise normal rectum and colon  . COLONOSCOPY N/A 07/16/2012   RMR: Colonic diverticulosis. 4  mm tubular adenoma  . COLONOSCOPY WITH PROPOFOL N/A 12/08/2017   Dr. Gala Romney: sigmoid and descending colon diverticulosis, transverse colon polyp (TUBULAR ADENOMA)  . ENDARTERECTOMY Right 10/08/2019   Procedure: RIGHT CAROTID ENDARTERECTOMY;  Surgeon: Rosetta Posner, MD;  Location: [facility] OR;  Service: Vascular;  Laterality: Right;  . ESOPHAGOGASTRODUODENOSCOPY (EGD) WITH ESOPHAGEAL DILATION N/A 07/16/2012   RMR: +Candida esophagitis, Erosive reflux esophagiits. Schatizi's ring status post dilation. Hiatal hernia. Antral erosions status post biopsy.   . ESOPHAGOGASTRODUODENOSCOPY (EGD) WITH PROPOFOL N/A 12/08/2017   Dr. Gala Romney: esophagitis, query EOE but negative for increased eosinophils on path, small hiatal hernia, normal stomach, normal duodenum, PAS stain with rare yeast forms on stain. Empiric dilation  . EYE SURGERY Bilateral 2018   cataract  . HERNIA REPAIR    . JOINT REPLACEMENT    . KNEE SURGERY     Left knee arthroscopy torn medial meniscus grade 4 chondral changes medial femoral condyle tibial plateau  . MALONEY DILATION N/A 12/08/2017   Procedure: Venia Minks DILATION;  Surgeon: [name], MD;  [facility];  Service: Endoscopy;  Laterality: N/A;  . PATCH ANGIOPLASTY Right 10/08/2019   Procedure: PATCH ANGIOPLASTY of the right common carotid artery using hemashield plaltinum finesse patch;  Surgeon: Rosetta Posner, MD;  Location: [facility] OR;  Service: Vascular;  Laterality: Right;  . POLYPECTOMY  12/08/2017   Procedure: POLYPECTOMY;  Surgeon: [name], MD;  [facility];  Service: Endoscopy;;  colon  . TOTAL KNEE ARTHROPLASTY Left 04/27/2012   Procedure: TOTAL KNEE ARTHROPLASTY;  Surgeon: Carole Civil, MD;  Location: [facility];  Service: Orthopedics;  Laterality: Left;  . UMBILICAL HERNIA REPAIR  1993    Current Outpatient Medications  Medication Sig Dispense Refill  . aspirin EC 81 MG tablet Take 81 mg by mouth daily.    . Aspirin-Salicylamide-Caffeine (BC  HEADACHE POWDER PO) Take 1 Package by mouth daily as needed (headache).    Marland Kitchen buPROPion (WELLBUTRIN SR) 150 MG 12 hr tablet Take 150 mg by mouth 2 (two) times daily.    . diphenhydrAMINE (BENADRYL) 25 MG tablet Take 25 mg by mouth daily as needed for allergies.     Marland Kitchen lisinopril-hydrochlorothiazide (ZESTORETIC) 20-12.5 MG tablet Take 1 tablet by mouth daily. 90 tablet 2  . metFORMIN (GLUCOPHAGE) 1000 MG tablet Take 1,000 mg by mouth 2 (two) times daily with a meal.    . metoprolol tartrate (LOPRESSOR) 25 MG tablet Take 1 tablet (25 mg total) by mouth 2 (two) times daily. 180 tablet 3  . NOVOLIN N 100 UNIT/ML injection Inject 25 Units into the skin 2 (two) times daily before a meal.     . oxyCODONE-acetaminophen (PERCOCET) 5-325 MG tablet Take 1 tablet by mouth every 6 (six) hours as needed. 8 tablet 0  . pantoprazole (PROTONIX) 40 MG tablet Take 1 tablet (40 mg total) by mouth daily. 60 tablet 3  . rosuvastatin (CRESTOR) 40 MG tablet Take 1 tablet (40 mg total) by mouth daily. 90 tablet 2  .  sertraline (ZOLOFT) 50 MG tablet Take 50 mg by mouth daily.    . valACYclovir (VALTREX) 1000 MG tablet Take 1,000 mg by mouth 3 (three) times daily. Take for 7 days then stop    . sildenafil (VIAGRA) 50 MG tablet Take 1 tablet (50 mg total) by mouth daily as needed for erectile dysfunction (30 minutes before intercourse). 5 tablet 0   No current facility-administered medications for this visit.   Allergies:  Patient has no known allergies.   Social History: The patient  reports that he has been smoking cigarettes. He has a 15.00 pack-year smoking history. His smokeless tobacco use includes chew. He reports previous alcohol use. He reports that he does not use drugs.   Family History: The patient's family history includes Colon cancer in some other family members; Colon polyps in his mother; Coronary artery disease in his father.   ROS:  Please see the history of present illness. Otherwise, complete review of  systems is positive for none.  All other systems are reviewed and negative.   Physical Exam: VS:  BP 118/78   Pulse 89   Ht 5\' 7"  (1.702 m)   Wt 156 lb (70.8 kg)   SpO2 98%   BMI 24.43 kg/m , BMI Body mass index is 24.43 kg/m.  Wt Readings from Last 3 Encounters:  10/21/19 156 lb (70.8 kg)  10/08/19 157 lb 1.4 oz (71.3 kg)  10/01/19 157 lb 1 oz (71.2 kg)    General: Patient appears comfortable at rest. Neck: Supple, no elevated JVP or carotid bruits, no thyromegaly. Lungs: Clear to auscultation, nonlabored breathing at rest. Cardiac: Regular rate and rhythm, no S3 or significant systolic murmur, no pericardial rub. Extremities: No pitting edema, distal pulses 2+. Skin: Warm and dry.  Right carotid endarterectomy incision clean with some scabbing noted. Musculoskeletal: No kyphosis. Neuropsychiatric: Alert and oriented x3, affect grossly appropriate.  ECG:  An ECG dated 10/21/2019 was personally reviewed today and demonstrated:  Normal sinus rhythm rate of 85.  Recent Labwork: 10/01/2019: ALT 31; AST 19 10/09/2019: BUN 14; Creatinine, Ser 0.88; Hemoglobin 10.7; Platelets 215; Potassium 3.7; Sodium 135     Component Value Date/Time   CHOL 230 (H) 01/05/2014 0543   TRIG 1,469 (H) 01/05/2014 0543   HDL NOT REPORTED DUE TO HIGH TRIGLYCERIDES 01/05/2014 0543   CHOLHDL NOT REPORTED DUE TO HIGH TRIGLYCERIDES 01/05/2014 0543   VLDL UNABLE TO CALCULATE IF TRIGLYCERIDE OVER 400 mg/dL 01/05/2014 0543   LDLCALC UNABLE TO CALCULATE IF TRIGLYCERIDE OVER 400 mg/dL 01/05/2014 0543    Other Studies Reviewed Today:  Carotid artery Duplex 09/28/2019  Right Carotid: Velocities in the right ICA are consistent with an 80-99% stenosis. Vertebrals: Right vertebral artery was not visualized. Subclavians: Normal flow hemodynamics were seen in the right subclavian artery.  Echocardiogram 12/11/2018: 1. Left ventricular ejection fraction, by visual estimation, is 60 to  65%. The left ventricle has  normal function. There is moderately increased  left ventricular hypertrophy.  2. Left ventricular diastolic Doppler parameters are consistent with  impaired relaxation pattern of LV diastolic filling.  3. Global right ventricle has low normal systolic function.The right  ventricular size is normal. No increase in right ventricular wall  thickness.  4. Left atrial size was normal.  5. Right atrial size was normal.  6. Mild mitral annular calcification.  7. Mild to moderate aortic valve annular calcification.  8. The mitral valve is degenerative. No evidence of mitral valve  regurgitation.  9. The tricuspid valve  is grossly normal. Tricuspid valve regurgitation  was not visualized by color flow Doppler.  10. The aortic valve is tricuspid Aortic valve regurgitation was not  visualized by color flow Doppler. Mild aortic valve sclerosis without  stenosis.  11. The pulmonic valve was not well visualized. Pulmonic valve  regurgitation is not visualized by color flow Doppler.  12. The inferior vena cava is normal in size with greater than 50%  respiratory variability, suggesting right atrial pressure of 3 mmHg.   Vascular US Aorta 09/28/2019 Indications: Evaluation of known dissection of infrarenal aorta and  ecsasia of left common iliac artery   Limitations: Air/bowel gas and Heavy respirations.    Performing Technologist: Ronal Fear RVS, RCS    Examination Guidelines: A complete evaluation includes B-mode imaging,  spectral Doppler, color Doppler, and power Doppler as needed of all accessible portions of each vessel. Bilateral testing is considered an integral part of a complete examination. Limited examinations for reoccurring indications may be performed as noted.     Abdominal Aorta Findings:  +-----------+-------+----------+----------+--------+--------+--------+  Location  AP (cm)Trans (cm)PSV (cm/s)WaveformThrombusComments   +-----------+-------+----------+----------+--------+--------+--------+  Proximal  1.73  1.96                      +-----------+-------+----------+----------+--------+--------+--------+  Distal   2.15  2.38                      +-----------+-------+----------+----------+--------+--------+--------+  LT CIA Prox2.2  2.3    89                  +-----------+-------+----------+----------+--------+--------+--------+   Visualization of the Mid Abdominal Aorta and Right CIA Proximal artery was  limited.   Summary:  Abdominal Aorta: The largest aortic measurement is 2.4 cm. Unable to  identify dissection of infrarenal aorta due to bowel gas.  The left common iliac artery measures approximately 2.25 cm x 2.33 cm,  based on limited visualization due to overlying bowel gas.      Assessment and Plan:  1. CAD in native artery   2. Asymptomatic carotid artery stenosis without infarction, right   3. Mixed hyperlipidemia   4. Essential hypertension, benign   5. AAA (abdominal aortic aneurysm) without rupture ([place])    1. CAD in native artery Denies any progressive anginal or exertional symptoms.  Continue aspirin 81 mg,  2. Asymptomatic carotid artery stenosis without infarction, right Right Carotid Endarterectomy with Dacron Patch Angioplasty 10/08/2019.  Right carotid artery incision clean and dry with some mild scabbing around the area.  Patient states he feels better since having the surgery.  3. Mixed hyperlipidemia Continue Crestor 40 mg daily. Lipids drawn on 12/07/2018 at PCP office showed triglycerides 363, cholesterol 124, HDL 41, LDL 45  4. Essential hypertension, benign Blood pressure is well controlled today with blood pressure at 118/78.  Heart rate is 89.  Continue lisinopril HCTZ 20/12.5 mg daily.  Metoprolol 25 mg p.o. twice daily.  5. AAA Vascular aortic ultrasound on 09/28/2019 largest  aortic measurement 2.4 cm.  Unable to identify dissection of infrarenal aorta due to bowel gas.  Left common iliac artery measures approximately 2.25 cm x 2.33 cm.  Based on limited visualization due to overlying bowel gas.  6.  Erectile dysfunction Patient states he is having issues with erectile dysfunction.  He knows it is likely due to a combination of his high blood pressure, diabetes and other factors.  He states he would like to try some ED medication.  Give  patient sildenafil 50 mg 5 pills.  Take 30 minutes to 1 hour prior to intercourse.  Do not take any nitrates 4 hours before or 4 hours after use.  7.  Smoking Patient states he still smoking 1/2 pack cigarettes per day.  Highly advised cessation.  Patient states he was told by Dr. Donnetta Hutching it was okay to smoke 1/2 pack of cigarettes daily but not to return to 1 pack/day.  He states he was told to be tapered the amount of cigarettes over time.   Medication Adjustments/Labs and Tests Ordered: Current medicines are reviewed at length with the patient today.  Concerns regarding medicines are outlined above.   Disposition: Follow-up with scheduled office visit January 26, 2019 p.m. Dr. Domenic Polite  Signed, Levell July, NP 10/21/2019 1:50 PM    [facility] [place], [place], [place] [address] Phone: [phone]; Fax: [phone]

## 2019-10-21 ENCOUNTER — Encounter: Payer: Self-pay | Admitting: Family Medicine

## 2019-10-21 ENCOUNTER — Ambulatory Visit (INDEPENDENT_AMBULATORY_CARE_PROVIDER_SITE_OTHER): Payer: Medicare HMO | Admitting: Family Medicine

## 2019-10-21 VITALS — BP 118/78 | HR 89 | Ht 67.0 in | Wt 156.0 lb

## 2019-10-21 DIAGNOSIS — I714 Abdominal aortic aneurysm, without rupture, unspecified: Secondary | ICD-10-CM

## 2019-10-21 DIAGNOSIS — I6521 Occlusion and stenosis of right carotid artery: Secondary | ICD-10-CM

## 2019-10-21 DIAGNOSIS — E782 Mixed hyperlipidemia: Secondary | ICD-10-CM

## 2019-10-21 DIAGNOSIS — I10 Essential (primary) hypertension: Secondary | ICD-10-CM | POA: Diagnosis not present

## 2019-10-21 DIAGNOSIS — I2510 Atherosclerotic heart disease of native coronary artery without angina pectoris: Secondary | ICD-10-CM | POA: Diagnosis not present

## 2019-10-21 MED ORDER — ROSUVASTATIN CALCIUM 40 MG PO TABS: 40.0000 mg | ORAL_TABLET | Freq: Every day | ORAL | 2 refills | Status: DC

## 2019-10-21 MED ORDER — SILDENAFIL CITRATE 50 MG PO TABS: 50.0000 mg | ORAL_TABLET | Freq: Every day | ORAL | 0 refills | Status: DC | PRN

## 2019-10-21 MED ORDER — LISINOPRIL-HYDROCHLOROTHIAZIDE 20-12.5 MG PO TABS: 1.0000 | ORAL_TABLET | Freq: Every day | ORAL | 2 refills | Status: DC

## 2019-10-21 NOTE — Patient Instructions (Addendum)
Medication Instructions:    Your physician recommends that you continue on your current medications as directed. Please refer to the Current Medication list given to you today.  Sildenafil 50 mg one by mouth daily as needed 30 minutes before intercourse  Do not take nitroglycerin 4 hours before or 4 hour after sildenafil  Labwork:  NONE  Testing/Procedures:  NONE  Follow-Up:  Your physician recommends that you schedule a follow-up appointment in: as planned in November 2021 with Dr. Domenic Polite.  Any Other Special Instructions Will Be Listed Below (If Applicable).  If you need a refill on your cardiac medications before your next appointment, please call your pharmacy.

## 2019-11-15 ENCOUNTER — Encounter: Payer: Self-pay | Admitting: Vascular Surgery

## 2019-11-15 ENCOUNTER — Other Ambulatory Visit: Payer: Self-pay

## 2019-11-15 ENCOUNTER — Ambulatory Visit (INDEPENDENT_AMBULATORY_CARE_PROVIDER_SITE_OTHER): Payer: Self-pay | Admitting: Vascular Surgery

## 2019-11-15 VITALS — BP 115/78 | HR 71 | Temp 97.7°F | Resp 16 | Ht 67.0 in | Wt 154.0 lb

## 2019-11-15 DIAGNOSIS — I6523 Occlusion and stenosis of bilateral carotid arteries: Secondary | ICD-10-CM

## 2019-11-15 NOTE — Progress Notes (Signed)
   Patient name: Jimmy Duran MRN: [id number] DOB: 09/09/1955 Sex: male  REASON FOR VISIT: Follow-up right carotid endarterectomy for severe asymptomatic disease  HPI: Jimmy Duran is a 64 y.o. male here today for follow-up.  Had undergone endarterectomy for severe asymptomatic disease.  He had no postoperative difficulty and was discharged home on postoperative day #1.  He has had no neurologic deficits.  Has had minimal postoperative discomfort.  Does have the usual periincisional numbness  Current Outpatient Medications  Medication Sig Dispense Refill  . aspirin EC 81 MG tablet Take 81 mg by mouth daily.    . Aspirin-Salicylamide-Caffeine (BC HEADACHE POWDER PO) Take 1 Package by mouth daily as needed (headache).    Marland Kitchen buPROPion (WELLBUTRIN SR) 150 MG 12 hr tablet Take 150 mg by mouth 2 (two) times daily.    . diphenhydrAMINE (BENADRYL) 25 MG tablet Take 25 mg by mouth daily as needed for allergies.     Marland Kitchen lisinopril-hydrochlorothiazide (ZESTORETIC) 20-12.5 MG tablet Take 1 tablet by mouth daily. 90 tablet 2  . metFORMIN (GLUCOPHAGE) 1000 MG tablet Take 1,000 mg by mouth 2 (two) times daily with a meal.    . metoprolol tartrate (LOPRESSOR) 25 MG tablet Take 1 tablet (25 mg total) by mouth 2 (two) times daily. 180 tablet 3  . NOVOLIN N 100 UNIT/ML injection Inject 25 Units into the skin 2 (two) times daily before a meal.     . pantoprazole (PROTONIX) 40 MG tablet Take 1 tablet (40 mg total) by mouth daily. 60 tablet 3  . rosuvastatin (CRESTOR) 40 MG tablet Take 1 tablet (40 mg total) by mouth daily. 90 tablet 2   No current facility-administered medications for this visit.     PHYSICAL EXAM: Vitals:   11/15/19 1013  BP: 115/78  Pulse: 71  Resp: 16  Temp: 97.7 F (36.5 C)  TempSrc: Temporal  SpO2: 98%  Weight: 154 lb (69.9 kg)  Height: 5\' 7"  (1.702 m)    GENERAL: The patient is a well-nourished male, in no acute distress. The vital signs  are documented above. Right neck incision is well-healed.  He has no bruits bilaterally. Grossly intact neurologically  MEDICAL ISSUES: Stable status post right carotid endarterectomy for severe asymptomatic disease.  Will resume full activities with no limitation.  He does have known moderate left carotid stenosis which is asymptomatic.  We will see him in 9 months with repeat carotid duplex.  He will notify should he develop any wound issues or neurologic deficits   Rosetta Posner, MD [facility] Tel [phone] Pager [phone]

## 2019-11-19 ENCOUNTER — Other Ambulatory Visit: Payer: Self-pay | Admitting: *Deleted

## 2019-11-19 DIAGNOSIS — I6523 Occlusion and stenosis of bilateral carotid arteries: Secondary | ICD-10-CM

## 2019-12-06 ENCOUNTER — Other Ambulatory Visit (HOSPITAL_COMMUNITY): Payer: Self-pay | Admitting: Internal Medicine

## 2019-12-06 ENCOUNTER — Other Ambulatory Visit: Payer: Self-pay | Admitting: Internal Medicine

## 2019-12-06 DIAGNOSIS — R110 Nausea: Secondary | ICD-10-CM

## 2019-12-06 DIAGNOSIS — R634 Abnormal weight loss: Secondary | ICD-10-CM

## 2019-12-10 ENCOUNTER — Other Ambulatory Visit: Payer: Self-pay

## 2019-12-10 ENCOUNTER — Ambulatory Visit (HOSPITAL_COMMUNITY)
Admission: RE | Admit: 2019-12-10 | Discharge: 2019-12-10 | Disposition: A | Discharge status: Home / Self Care | Payer: Medicare HMO | Source: Ambulatory Visit | Attending: Vascular Surgery | Admitting: Vascular Surgery

## 2019-12-10 DIAGNOSIS — I6523 Occlusion and stenosis of bilateral carotid arteries: Secondary | ICD-10-CM | POA: Insufficient documentation

## 2019-12-28 ENCOUNTER — Ambulatory Visit (HOSPITAL_COMMUNITY): Payer: Medicare HMO

## 2019-12-28 ENCOUNTER — Encounter (HOSPITAL_COMMUNITY): Payer: Self-pay

## 2020-01-25 NOTE — Progress Notes (Signed)
Cardiology Office Note  Date: 01/26/2020   ID: [name], DOB 09/11/1955, MRN [id number]  PCP:  Jimmy Richard, MD  Cardiologist:  [name], MD Electrophysiologist:  None   Chief Complaint  Patient presents with  . Cardiac follow-up    History of Present Illness: Jimmy Duran is a 64 y.o. male last seen in August by Mr. Jimmy Sake NP.  He presents for a routine follow-up visit.  From a cardiac perspective he does not describe any obvious angina symptoms, no unusual shortness of breath with activity.  He continues to follow with Dr. Donnetta Hutching status post right carotid endarterectomy.  Recent follow-up carotid Dopplers are outlined below, he is following on a 85-month basis.  I reviewed his medications which are outlined below and stable from a cardiac perspective.  I recommended that he follow-up with his PCP to get a repeat lipid panel.  Ideally his LDL should be under 70.  Past Medical History:  Diagnosis Date  . AAA (abdominal aortic aneurysm) ([place])   . Arthritis   . Carotid artery disease ([place])   . Colon polyp   . COPD (chronic obstructive pulmonary disease) ([place])   . Coronary artery disease    Nonobstructive  . Essential hypertension   . GERD (gastroesophageal reflux disease)   . Hemorrhoids   . History of TIA (transient ischemic attack)   . Low back pain   . Lumbar radiculopathy   . Mixed hyperlipidemia   . Palpitations   . Peripheral vascular disease ([place])   . Shingles 09/23/2019  . Type 2 diabetes mellitus ([place])     Past Surgical History:  Procedure Laterality Date  . APPENDECTOMY  1970  . BIOPSY  12/08/2017   Procedure: BIOPSY;  Surgeon: Jimmy Dolin, MD;  [facility];  Service: Endoscopy;;  esophagus   . BREAST SURGERY Right    Cyst resection on the right  . CARDIAC CATHETERIZATION  1990's   in [place]. no stent placement  . CATARACT EXTRACTION W/PHACO Right 06/18/2016   Procedure: CATARACT EXTRACTION PHACO AND INTRAOCULAR  LENS PLACEMENT (IOC);  Surgeon: Jimmy Robson, MD;  Location: [facility];  Service: Ophthalmology;  Laterality: Right;  [name] 35.7 AP% 16.6 CDE 5.94 Fluid pack lot # [id number]  . CATARACT EXTRACTION W/PHACO Left 07/16/2016   Procedure: CATARACT EXTRACTION PHACO AND INTRAOCULAR LENS PLACEMENT (IOC);  Surgeon: Jimmy Robson, MD;  Location: [facility];  Service: Ophthalmology;  Laterality: Left;  [name] 51.8 AP% 12.7 CDE 6.58 Fluid Pack Lot # [id number]  . CHOLECYSTECTOMY  1993  . COLONOSCOPY  02/12/2007   XFG:[id number] friable anal canal hemorrhoids, otherwise normal rectum and colon  . COLONOSCOPY N/A 07/16/2012   RMR: Colonic diverticulosis. 4 mm tubular adenoma  . COLONOSCOPY WITH PROPOFOL N/A 12/08/2017   Dr. Gala Duran: sigmoid and descending colon diverticulosis, transverse colon polyp (TUBULAR ADENOMA)  . ENDARTERECTOMY Right 10/08/2019   Procedure: RIGHT CAROTID ENDARTERECTOMY;  Surgeon: Jimmy Posner, MD;  Location: [facility];  Service: Vascular;  Laterality: Right;  . ESOPHAGOGASTRODUODENOSCOPY (EGD) WITH ESOPHAGEAL DILATION N/A 07/16/2012   RMR: +Candida esophagitis, Erosive reflux esophagiits. Schatizi's ring status post dilation. Hiatal hernia. Antral erosions status post biopsy.   . ESOPHAGOGASTRODUODENOSCOPY (EGD) WITH PROPOFOL N/A 12/08/2017   Dr. Gala Duran: esophagitis, query EOE but negative for increased eosinophils on path, small hiatal hernia, normal stomach, normal duodenum, PAS stain with rare yeast forms on stain. Empiric dilation  . EYE SURGERY Bilateral 2018   cataract  . HERNIA REPAIR    .  JOINT REPLACEMENT    . KNEE SURGERY     Left knee arthroscopy torn medial meniscus grade 4 chondral changes medial femoral condyle tibial plateau  . MALONEY DILATION N/A 12/08/2017   Procedure: Venia Minks DILATION;  Surgeon: Jimmy Dolin, MD;  [facility];  Service: Endoscopy;  Laterality: N/A;  . PATCH ANGIOPLASTY Right 10/08/2019   Procedure: PATCH ANGIOPLASTY of the right common carotid  artery using hemashield plaltinum finesse patch;  Surgeon: Jimmy Posner, MD;  Location: [facility];  Service: Vascular;  Laterality: Right;  . POLYPECTOMY  12/08/2017   Procedure: POLYPECTOMY;  Surgeon: Jimmy Dolin, MD;  [facility];  Service: Endoscopy;;  colon  . TOTAL KNEE ARTHROPLASTY Left 04/27/2012   Procedure: TOTAL KNEE ARTHROPLASTY;  Surgeon: Jimmy Civil, MD;  Location: [facility];  Service: Orthopedics;  Laterality: Left;  . UMBILICAL HERNIA REPAIR  1993    Current Outpatient Medications  Medication Sig Dispense Refill  . aspirin EC 81 MG tablet Take 81 mg by mouth daily.    . Aspirin-Salicylamide-Caffeine (BC HEADACHE POWDER PO) Take 1 Package by mouth daily as needed (headache).    Marland Kitchen buPROPion (WELLBUTRIN SR) 150 MG 12 hr tablet Take 150 mg by mouth 2 (two) times daily.    . diphenhydrAMINE (BENADRYL) 25 MG tablet Take 25 mg by mouth daily as needed for allergies.     Marland Kitchen lisinopril-hydrochlorothiazide (ZESTORETIC) 20-12.5 MG tablet Take 1 tablet by mouth daily. 90 tablet 3  . metFORMIN (GLUCOPHAGE) 1000 MG tablet Take 1,000 mg by mouth 2 (two) times daily with a meal.    . metoprolol tartrate (LOPRESSOR) 25 MG tablet Take 1 tablet (25 mg total) by mouth 2 (two) times daily. 180 tablet 3  . NOVOLIN N 100 UNIT/ML injection Inject 25 Units into the skin 2 (two) times daily before a meal.     . pantoprazole (PROTONIX) 40 MG tablet Take 1 tablet (40 mg total) by mouth daily. 60 tablet 3  . rosuvastatin (CRESTOR) 40 MG tablet Take 1 tablet (40 mg total) by mouth daily. 90 tablet 2   No current facility-administered medications for this visit.   Allergies:  Patient has no known allergies.   ROS:  Erectile dysfunction.  Physical Exam: VS:  BP (!) 142/82   Pulse 84   Ht 5\' 7"  (1.702 m)   Wt 150 lb (68 kg)   SpO2 95%   BMI 23.49 kg/m , BMI Body mass index is 23.49 kg/m.  Wt Readings from Last 3 Encounters:  01/26/20 150 lb (68 kg)  11/15/19 154 lb (69.9 kg)   10/21/19 156 lb (70.8 kg)    General: Patient appears comfortable at rest. HEENT: Conjunctiva and lids normal, wearing a mask. Neck: Supple, no elevated JVP, right CEA scar, no thyromegaly. Lungs: Clear to auscultation, nonlabored breathing at rest. Cardiac: Regular rate and rhythm, no S3 or significant systolic murmur, no pericardial rub. Extremities: No pitting edema.  ECG:  An ECG dated 10/21/2019 was personally reviewed today and demonstrated:  Normal sinus rhythm.  Recent Labwork: 10/01/2019: ALT 31; AST 19 10/09/2019: BUN 14; Creatinine, Ser 0.88; Hemoglobin 10.7; Platelets 215; Potassium 3.7; Sodium 135   Other Studies Reviewed Today:  Echocardiogram 12/11/2018: 1. Left ventricular ejection fraction, by visual estimation, is 60 to  65%. The left ventricle has normal function. There is moderately increased  left ventricular hypertrophy.  2. Left ventricular diastolic Doppler parameters are consistent with  impaired relaxation pattern of LV diastolic filling.  3. Global right ventricle has low normal systolic function.The right  ventricular size is normal. No increase in right ventricular wall  thickness.  4. Left atrial size was normal.  5. Right atrial size was normal.  6. Mild mitral annular calcification.  7. Mild to moderate aortic valve annular calcification.  8. The mitral valve is degenerative. No evidence of mitral valve  regurgitation.  9. The tricuspid valve is grossly normal. Tricuspid valve regurgitation  was not visualized by color flow Doppler.  10. The aortic valve is tricuspid Aortic valve regurgitation was not  visualized by color flow Doppler. Mild aortic valve sclerosis without  stenosis.  11. The pulmonic valve was not well visualized. Pulmonic valve  regurgitation is not visualized by color flow Doppler.  12. The inferior vena cava is normal in size with greater than 50%  respiratory variability, suggesting right atrial pressure of 3 mmHg.    Carotid Dopplers 12/10/2019: IMPRESSION: Right:  Interval surgical changes of right carotid endarterectomy. Note that established duplex criteria not been validated in the setting of prior surgery/stent, however, there is no evidence of recurrent high-grade stenosis.  Left:  Color duplex indicates moderate heterogeneous and calcified plaque, with no hemodynamically significant stenosis by duplex criteria in the extracranial cerebrovascular circulation. Note that the flow velocities of the left ICA were obtained from an area distal to the maximum narrowing due to the presence of anterior wall plaque with shadowing and may be underestimating the percentage of ICA stenosis. If establishing a more accurate degree of stenosis is required, cerebral angiogram should be considered, or as a second best test, CTA.  Assessment and Plan:  1.  Nonobstructive CAD without active angina symptoms.  Plan to continue medical therapy which includes aspirin, Zestoretic, Lopressor, and Crestor.  2.  Carotid artery disease status post right carotid endarterectomy with continued follow-up by Dr. Donnetta Hutching.  Recent Dopplers are noted above.  Continue aspirin and statin.  3.  Mixed hyperlipidemia, on high-dose Crestor.  I recommended that he follow-up with his PCP for repeat lipid panel.  Ideally LDL should be under 70.  4.  Erectile dysfunction.  No cardiac contraindication at this time for him to use sildenafil (clinically stable and no use of nitrates).  I recommended that he follow-up with his PCP if he wants to try a prescription.  Medication Adjustments/Labs and Tests Ordered: Current medicines are reviewed at length with the patient today.  Concerns regarding medicines are outlined above.   Tests Ordered: No orders of the defined types were placed in this encounter.   Medication Changes: Meds ordered this encounter  Medications  . lisinopril-hydrochlorothiazide (ZESTORETIC) 20-12.5 MG tablet     Sig: Take 1 tablet by mouth daily.    Dispense:  90 tablet    Refill:  3    Disposition:  Follow up 1 year in the [place] office.  Signed, Satira Sark, MD, [facility] 01/26/2020 2:26 PM    [place] [facility] [address] [place], [place] [address] Phone: [phone]; Fax: [phone]

## 2020-01-26 ENCOUNTER — Ambulatory Visit (INDEPENDENT_AMBULATORY_CARE_PROVIDER_SITE_OTHER): Payer: Medicare HMO | Admitting: Cardiology

## 2020-01-26 ENCOUNTER — Other Ambulatory Visit: Payer: Self-pay

## 2020-01-26 ENCOUNTER — Encounter: Payer: Self-pay | Admitting: Cardiology

## 2020-01-26 VITALS — BP 142/82 | HR 84 | Ht 67.0 in | Wt 150.0 lb

## 2020-01-26 DIAGNOSIS — I6523 Occlusion and stenosis of bilateral carotid arteries: Secondary | ICD-10-CM | POA: Diagnosis not present

## 2020-01-26 DIAGNOSIS — E782 Mixed hyperlipidemia: Secondary | ICD-10-CM

## 2020-01-26 DIAGNOSIS — I25119 Atherosclerotic heart disease of native coronary artery with unspecified angina pectoris: Secondary | ICD-10-CM

## 2020-01-26 MED ORDER — LISINOPRIL-HYDROCHLOROTHIAZIDE 20-12.5 MG PO TABS: 1.0000 | ORAL_TABLET | Freq: Every day | ORAL | 3 refills | Status: DC

## 2020-01-26 NOTE — Patient Instructions (Signed)
Medication Instructions:  Your physician recommends that you continue on your current medications as directed. Please refer to the Current Medication list given to you today.  *If you need a refill on your cardiac medications before your next appointment, please call your pharmacy*   Lab Work: NONE   If you have labs (blood work) drawn today and your tests are completely normal, you will receive your results only by: MyChart Message (if you have MyChart) OR A paper copy in the mail If you have any lab test that is abnormal or we need to change your treatment, we will call you to review the results.   Testing/Procedures: NONE    Follow-Up: At [facility], you and your health needs are our priority.  As part of our continuing mission to provide you with exceptional heart care, we have created designated Provider Care Teams.  These Care Teams include your primary Cardiologist (physician) and Advanced Practice Providers (APPs -  Physician Assistants and Nurse Practitioners) who all work together to provide you with the care you need, when you need it.  We recommend signing up for the patient portal called "MyChart".  Sign up information is provided on this After Visit Summary.  MyChart is used to connect with patients for Virtual Visits (Telemedicine).  Patients are able to view lab/test results, encounter notes, upcoming appointments, etc.  Non-urgent messages can be sent to your provider as well.   To learn more about what you can do with MyChart, go to [email or web address].    Your next appointment:   1 year(s)  The format for your next appointment:   In Person  Provider:   Samuel McDowell, MD   Other Instructions Thank you for choosing [facility]!    

## 2020-05-30 LAB — LIPID PANEL
Cholesterol: 126 (ref 0–200)
HDL: 36 (ref 35–70)
LDL Cholesterol: 51
Triglycerides: 375 — AB (ref 40–160)

## 2020-06-23 ENCOUNTER — Other Ambulatory Visit: Payer: Self-pay

## 2020-06-23 ENCOUNTER — Ambulatory Visit (HOSPITAL_COMMUNITY)
Admission: RE | Admit: 2020-06-23 | Discharge: 2020-06-23 | Disposition: A | Discharge status: Home / Self Care | Payer: Medicare HMO | Source: Ambulatory Visit | Attending: Internal Medicine | Admitting: Internal Medicine

## 2020-06-23 DIAGNOSIS — R110 Nausea: Secondary | ICD-10-CM

## 2020-06-23 DIAGNOSIS — R634 Abnormal weight loss: Secondary | ICD-10-CM

## 2020-06-23 LAB — POCT I-STAT CREATININE: Creatinine, Ser: 1.4 mg/dL — ABNORMAL HIGH (ref 0.61–1.24)

## 2020-06-23 MED ORDER — IOHEXOL 300 MG/ML  SOLN
100.0000 mL | Freq: Once | INTRAMUSCULAR | Status: AC | PRN
  Administered 2020-06-23: 100 mL via INTRAVENOUS

## 2020-06-29 NOTE — Progress Notes (Signed)
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 male with a history of CAD, HTN, GERD, TIA, PVD, HLD, DM2, palpitation, COPD, AAA, carotid artery disease.  Last seen by Dr. Diona BrownerMcDowell 01/26/2020 for routine follow-up.  He did not describe any anginal symptoms or shortness of breath with activity.  He was continuing to follow Dr. Arbie CookeyEarly status post right CEA.  He was following Dr. Arbie CookeyEarly on 6674-month basis.  Had recent carotid Dopplers.  His medications were stable from a cardiac perspective.  It was recommended by Dr. Diona BrownerMcDowell that he follow-up with PCP to get a repeat lipid panel.  Ideally his LDL should be under 70.  Plan was to continue medical therapy including aspirin, Zestoretic, Lopressor, Crestor.  He had a recent CT of abdomen pelvis ordered by Dr Alvina Filbertenise Hunter PCP due to nausea and unintentional weight loss of 40 pounds over 6 months.  There was significant atherosclerotic disease noted including a high-grade stenosis of superior mesenteric artery which had progressed since the prior study in 2019.  Also evidence of aortic atherosclerosis.  He states he has been having some issues with his abdomen for a long time with abdominal pain.  He states this was the reason his primary care provider ordered a CT.  Otherwise he denies any recent issues other than problems controlling his blood sugars.  Denies any anginal or exertional symptoms other than than some mild dyspnea on exertion which she attributes to COPD.  He is a long-term smoker.  He denies any orthostatic symptoms, CVA or TIA-like symptoms, PND orthopnea.  No bleeding issues.  No claudication-like symptoms, DVT or PE-like symptoms, or lower extremity edema.  Past  Medical History:  Diagnosis Date  . AAA (abdominal aortic aneurysm) (HCC)   . Arthritis   . Carotid artery disease (HCC)   . Colon polyp   . COPD (chronic obstructive pulmonary disease) (HCC)   . Coronary artery disease    Nonobstructive  . Essential hypertension   . GERD (gastroesophageal reflux disease)   . Hemorrhoids   . History of TIA (transient ischemic attack)   . Low back pain   . Lumbar radiculopathy   . Mixed hyperlipidemia   . Palpitations   . Peripheral vascular disease (HCC)   . Shingles 09/23/2019  . Type 2 diabetes mellitus (HCC)     Past Surgical History:  Procedure Laterality Date  . APPENDECTOMY  1970  . BIOPSY  12/08/2017   Procedure: BIOPSY;  Surgeon: Corbin Adeourk, Robert M, MD;  [facility];  Service: Endoscopy;;  esophagus   . BREAST SURGERY Right    Cyst resection on the right  . CARDIAC CATHETERIZATION  [place]  . CATARACT EXTRACTION W/PHACO Right 06/18/2016   Procedure: CATARACT EXTRACTION PHACO AND INTRAOCULAR LENS PLACEMENT (IOC);  Surgeon: Galen ManilaWilliam Porfilio, MD;  Location: [facility];  Service: Ophthalmology;  Laterality: Right;  US 35.7 AP% 16.6 CDE 5.94 Fluid pack lot # [id number]  . CATARACT EXTRACTION W/PHACO Left 07/16/2016   Procedure: CATARACT EXTRACTION PHACO AND INTRAOCULAR LENS PLACEMENT (IOC);  Surgeon: Galen ManilaPorfilio, Bensyn, MD;  Location: [facility];  Service: Ophthalmology;  Laterality: Left;  US 51.8 AP% 12.7 CDE 6.58  Fluid Pack Lot # [id number]  . CHOLECYSTECTOMY  1993  . COLONOSCOPY  02/12/2007   [id number] friable anal canal hemorrhoids, otherwise normal rectum and colon  . COLONOSCOPY N/A 07/16/2012   RMR: Colonic diverticulosis. 4 mm tubular adenoma  . COLONOSCOPY WITH PROPOFOL N/A 12/08/2017   Dr. Gala Romney: sigmoid and descending colon diverticulosis, transverse colon polyp (TUBULAR ADENOMA)  . ENDARTERECTOMY Right 10/08/2019   Procedure: RIGHT CAROTID ENDARTERECTOMY;  Surgeon: Rosetta Posner, MD;   Location: [facility];  Service: Vascular;  Laterality: Right;  . ESOPHAGOGASTRODUODENOSCOPY (EGD) WITH ESOPHAGEAL DILATION N/A 07/16/2012   RMR: +Candida esophagitis, Erosive reflux esophagiits. Schatizi's ring status post dilation. Hiatal hernia. Antral erosions status post biopsy.   . ESOPHAGOGASTRODUODENOSCOPY (EGD) WITH PROPOFOL N/A 12/08/2017   Dr. Gala Romney: esophagitis, query EOE but negative for increased eosinophils on path, small hiatal hernia, normal stomach, normal duodenum, PAS stain with rare yeast forms on stain. Empiric dilation  . EYE SURGERY Bilateral 2018   cataract  . HERNIA REPAIR    . JOINT REPLACEMENT    . KNEE SURGERY     Left knee arthroscopy torn medial meniscus grade 4 chondral changes medial femoral condyle tibial plateau  . MALONEY DILATION N/A 12/08/2017   Procedure: Venia Minks DILATION;  Surgeon: [name], MD;  [facility];  Service: Endoscopy;  Laterality: N/A;  . PATCH ANGIOPLASTY Right 10/08/2019   Procedure: PATCH ANGIOPLASTY of the right common carotid artery using hemashield plaltinum finesse patch;  Surgeon: Rosetta Posner, MD;  Location: [facility];  Service: Vascular;  Laterality: Right;  . POLYPECTOMY  12/08/2017   Procedure: POLYPECTOMY;  Surgeon: [name], MD;  [facility];  Service: Endoscopy;;  colon  . TOTAL KNEE ARTHROPLASTY Left 04/27/2012   Procedure: TOTAL KNEE ARTHROPLASTY;  Surgeon: Carole Civil, MD;  Location: [facility];  Service: Orthopedics;  Laterality: Left;  . UMBILICAL HERNIA REPAIR  1993    Current Outpatient Medications  Medication Sig Dispense Refill  . aspirin EC 81 MG tablet Take 81 mg by mouth daily.    . Aspirin-Salicylamide-Caffeine (BC HEADACHE POWDER PO) Take 1 Package by mouth daily as needed (headache).    Marland Kitchen buPROPion (WELLBUTRIN SR) 150 MG 12 hr tablet Take 150 mg by mouth 2 (two) times daily.    . diphenhydrAMINE (BENADRYL) 25 MG tablet Take 25 mg by mouth daily as needed for allergies.     Marland Kitchen  lisinopril-hydrochlorothiazide (ZESTORETIC) 20-12.5 MG tablet Take 1 tablet by mouth daily. 90 tablet 3  . metFORMIN (GLUCOPHAGE) 1000 MG tablet Take 1,000 mg by mouth 2 (two) times daily with a meal.    . metoprolol tartrate (LOPRESSOR) 25 MG tablet Take 1 tablet (25 mg total) by mouth 2 (two) times daily. 180 tablet 3  . NOVOLIN N 100 UNIT/ML injection Inject 25 Units into the skin 2 (two) times daily before a meal.     . rosuvastatin (CRESTOR) 40 MG tablet Take 1 tablet (40 mg total) by mouth daily. 90 tablet 2   No current facility-administered medications for this visit.   Allergies:  Patient has no known allergies.   Social History: The patient  reports that he has been smoking cigarettes. He has a 15.00 pack-year smoking history. He has quit using smokeless tobacco.  His smokeless tobacco use included chew. He reports previous alcohol use. He reports that he does not use drugs.   Family History: The patient's family history includes Colon cancer in some other family members; Colon  polyps in his mother; Coronary artery disease in his father.   ROS:  Please see the history of present illness. Otherwise, complete review of systems is positive for none.  All other systems are reviewed and negative.   Physical Exam: VS:  BP 134/80   Pulse 73   Ht 5\' 7"  (1.702 m)   Wt 154 lb 12.8 oz (70.2 kg)   SpO2 99%   BMI 24.25 kg/m , BMI Body mass index is 24.25 kg/m.  Wt Readings from Last 3 Encounters:  06/30/20 154 lb 12.8 oz (70.2 kg)  01/26/20 150 lb (68 kg)  11/15/19 154 lb (69.9 kg)    General: Patient appears comfortable at rest. Neck: Supple, no elevated JVP or carotid bruits, no thyromegaly. Lungs: Clear to auscultation, nonlabored breathing at rest. Cardiac: Regular rate and rhythm, no S3 or significant systolic murmur, no pericardial rub. Extremities: No pitting edema, distal pulses 2+. Skin: Warm and dry. Musculoskeletal: No kyphosis. Neuropsychiatric: Alert and oriented x3,  affect grossly appropriate.  ECG:  EKG October 21, 2019 normal sinus rhythm rate of 85  Recent Labwork: 10/01/2019: ALT 31; AST 19 10/09/2019: BUN 14; Hemoglobin 10.7; Platelets 215; Potassium 3.7; Sodium 135 06/23/2020: Creatinine, Ser 1.40     Component Value Date/Time   CHOL 230 (H) 01/05/2014 0543   TRIG 1,469 (H) 01/05/2014 0543   HDL NOT REPORTED DUE TO HIGH TRIGLYCERIDES 01/05/2014 0543   CHOLHDL NOT REPORTED DUE TO HIGH TRIGLYCERIDES 01/05/2014 0543   VLDL UNABLE TO CALCULATE IF TRIGLYCERIDE OVER 400 mg/dL 01/05/2014 0543   LDLCALC UNABLE TO CALCULATE IF TRIGLYCERIDE OVER 400 mg/dL 01/05/2014 0543    Other Studies Reviewed Today:  CT Abdomen / Pelvis 06/23/2020  nausea, uninitentional weight loss 40 lbs over 6 months IMPRESSION: 1. Mild wall thickening of the distal esophagus. Correlation for symptoms of esophagitis is recommended. 2. Rectosigmoid diverticulosis without acute inflammation. 3. Nonobstructive left nephrolithiasis. 4. Significant atherosclerotic disease is noted including a high-grade stenosis of the SMA which has progressed since the prior study in 2018.  Aortic Atherosclerosis (ICD10-I70.0).   Echocardiogram 12/11/2018: 1. Left ventricular ejection fraction, by visual estimation, is 60 to  65%. The left ventricle has normal function. There is moderately increased  left ventricular hypertrophy.  2. Left ventricular diastolic Doppler parameters are consistent with  impaired relaxation pattern of LV diastolic filling.  3. Global right ventricle has low normal systolic function.The right  ventricular size is normal. No increase in right ventricular wall  thickness.  4. Left atrial size was normal.  5. Right atrial size was normal.  6. Mild mitral annular calcification.  7. Mild to moderate aortic valve annular calcification.  8. The mitral valve is degenerative. No evidence of mitral valve  regurgitation.  9. The tricuspid valve is grossly  normal. Tricuspid valve regurgitation  was not visualized by color flow Doppler.  10. The aortic valve is tricuspid Aortic valve regurgitation was not  visualized by color flow Doppler. Mild aortic valve sclerosis without  stenosis.  11. The pulmonic valve was not well visualized. Pulmonic valve  regurgitation is not visualized by color flow Doppler.  12. The inferior vena cava is normal in size with greater than 50%  respiratory variability, suggesting right atrial pressure of 3 mmHg.   Carotid Dopplers 12/10/2019: IMPRESSION: Right:  Interval surgical changes of right carotid endarterectomy. Note that established duplex criteria not been validated in the setting of prior surgery/stent, however, there is no evidence of recurrent high-grade stenosis.  Left:  Color duplex indicates moderate heterogeneous and calcified plaque, with no hemodynamically significant stenosis by duplex criteria in the extracranial cerebrovascular circulation. Note that the flow velocities of the left ICA were obtained from an area distal to the maximum narrowing due to the presence of anterior wall plaque with shadowing and may be underestimating the percentage of ICA stenosis. If establishing a more accurate degree of stenosis is required, cerebral angiogram should be considered, or as a second best test, CTA. Assessment and Plan:  1. CAD in native artery   2. Mixed hyperlipidemia   3. Superior mesenteric artery stenosis ([place])   4. Tobacco abuse   5. Essential hypertension    1. CAD in native artery Denies any anginal or exertional symptoms today.  Continue aspirin 81 mg daily.    2. Mixed hyperlipidemia Continue Crestor 40 mg daily.  3. Superior mesenteric artery stenosis (HCC) Recent abdominal CT ordered by PCP for continue abdominal pain demonstrated Significant atherosclerotic disease noted including high-grade stenosis of the SMA which is progressed since prior study in 2018.  Please  refer back to his vascular surgeon Dr. Donnetta Hutching for evaluation and management.  4. Tobacco abuse Long-term smoker since age of 89 per his statement.  States he continues to smoke about a half a pack per day.  Advised cessation.  5.  Essential hypertension Continue Zestoretic 20/12.5 mg p.o. daily.  Continue metoprolol p.o. twice daily.  BP today is 134/80.  Medication Adjustments/Labs and Tests Ordered: Current medicines are reviewed at length with the patient today.  Concerns regarding medicines are outlined above.   Disposition: Follow-up with Dr. Domenic Polite or APP 6 months  Signed, Levell July, NP 06/30/2020 10:02 AM    [facility] [place], [place], [place] [address] Phone: [phone]; Fax: [phone]

## 2020-06-30 ENCOUNTER — Ambulatory Visit (INDEPENDENT_AMBULATORY_CARE_PROVIDER_SITE_OTHER): Payer: Medicare HMO | Admitting: Family Medicine

## 2020-06-30 ENCOUNTER — Other Ambulatory Visit: Payer: Self-pay

## 2020-06-30 ENCOUNTER — Encounter: Payer: Self-pay | Admitting: Family Medicine

## 2020-06-30 VITALS — BP 134/80 | HR 73 | Ht 67.0 in | Wt 154.8 lb

## 2020-06-30 DIAGNOSIS — I10 Essential (primary) hypertension: Secondary | ICD-10-CM

## 2020-06-30 DIAGNOSIS — I2510 Atherosclerotic heart disease of native coronary artery without angina pectoris: Secondary | ICD-10-CM

## 2020-06-30 DIAGNOSIS — K551 Chronic vascular disorders of intestine: Secondary | ICD-10-CM

## 2020-06-30 DIAGNOSIS — E782 Mixed hyperlipidemia: Secondary | ICD-10-CM | POA: Diagnosis not present

## 2020-06-30 DIAGNOSIS — I25119 Atherosclerotic heart disease of native coronary artery with unspecified angina pectoris: Secondary | ICD-10-CM

## 2020-06-30 DIAGNOSIS — Z720 Tobacco use: Secondary | ICD-10-CM

## 2020-06-30 NOTE — Patient Instructions (Signed)
Medication Instructions:  Continue all current medications.  Labwork: none  Testing/Procedures: none  Follow-Up: 6 months   Any Other Special Instructions Will Be Listed Below (If Applicable). You have been referred to:  [facility]   If you need a refill on your cardiac medications before your next appointment, please call your pharmacy.

## 2020-07-10 ENCOUNTER — Ambulatory Visit (INDEPENDENT_AMBULATORY_CARE_PROVIDER_SITE_OTHER): Payer: Medicare HMO | Admitting: Vascular Surgery

## 2020-07-10 ENCOUNTER — Encounter: Payer: Self-pay | Admitting: Vascular Surgery

## 2020-07-10 ENCOUNTER — Other Ambulatory Visit: Payer: Self-pay

## 2020-07-10 VITALS — BP 137/88 | HR 78 | Temp 97.9°F | Ht 67.0 in | Wt 156.2 lb

## 2020-07-10 DIAGNOSIS — K551 Chronic vascular disorders of intestine: Secondary | ICD-10-CM | POA: Diagnosis not present

## 2020-07-10 NOTE — Progress Notes (Signed)
[facility] [place]  Patient name: Jimmy Duran MRN: [id number] DOB: Dec 07, 1955 Sex: male  REASON FOR VISIT: Discuss CT showing superior mesenteric artery stenosis  HPI: Jimmy Duran is a 65 y.o. male known to me from prior right carotid endarterectomy for symptomatic disease.  He did well.  He reports that over the past 6 months he has been losing weight and having abdominal pain.  He reports that he is lifelong had issues with abdominal discomfort.  He reports this is become more prevalent and reports that he does have pain occasionally related to eating.  He reports that on occasion he can eat without difficulty but it on other times he has to stop eating soon after beginning for abdominal pain.  He does not have any change in bowel habits.  Past Medical History:  Diagnosis Date  . AAA (abdominal aortic aneurysm) ([place])   . Arthritis   . Carotid artery disease ([place])   . Colon polyp   . COPD (chronic obstructive pulmonary disease) ([place])   . Coronary artery disease    Nonobstructive  . Essential hypertension   . GERD (gastroesophageal reflux disease)   . Hemorrhoids   . History of TIA (transient ischemic attack)   . Low back pain   . Lumbar radiculopathy   . Mixed hyperlipidemia   . Palpitations   . Peripheral vascular disease ([place])   . Shingles 09/23/2019  . Type 2 diabetes mellitus (HCC)     Family History  Problem Relation Age of Onset  . Colon polyps Mother   . Coronary artery disease Father        CABG in his 62's  . Colon cancer Other        maternal great aunt  . Colon cancer Other        paternal great aunt    SOCIAL HISTORY: Social History   Tobacco Use  . Smoking status: Current Every Day Smoker    Packs/day: 0.50    Years: 30.00    Pack years: 15.00    Types: Cigarettes  . Smokeless tobacco: Former Systems developer    Types: Chew  . Tobacco comment: 1 pk per day.   Substance Use Topics  .  Alcohol use: Not Currently    Alcohol/week: 0.0 standard drinks    Comment: socially "beer and tomato juice"     No Known Allergies  Current Outpatient Medications  Medication Sig Dispense Refill  . aspirin EC 81 MG tablet Take 81 mg by mouth daily.    . Aspirin-Salicylamide-Caffeine (BC HEADACHE POWDER PO) Take 1 Package by mouth daily as needed (headache).    Marland Kitchen buPROPion (WELLBUTRIN SR) 150 MG 12 hr tablet Take 150 mg by mouth 2 (two) times daily.    . diphenhydrAMINE (BENADRYL) 25 MG tablet Take 25 mg by mouth daily as needed for allergies.     Marland Kitchen lisinopril-hydrochlorothiazide (ZESTORETIC) 20-12.5 MG tablet Take 1 tablet by mouth daily. 90 tablet 3  . metFORMIN (GLUCOPHAGE) 1000 MG tablet Take 1,000 mg by mouth 2 (two) times daily with a meal.    . NOVOLIN N 100 UNIT/ML injection Inject 25 Units into the skin 2 (two) times daily before a meal.     . rosuvastatin (CRESTOR) 40 MG tablet Take 1 tablet (40 mg total) by mouth daily. 90 tablet 2  . metoprolol tartrate (LOPRESSOR) 25 MG tablet Take 1 tablet (25 mg total) by mouth 2 (two) times daily. 180 tablet 3  No current facility-administered medications for this visit.    REVIEW OF SYSTEMS:  [X]  denotes positive finding, [ ]  denotes negative finding Cardiac  Comments:  Chest pain or chest pressure:    Shortness of breath upon exertion:    Short of breath when lying flat:    Irregular heart rhythm:        Vascular    Pain in calf, thigh, or hip brought on by ambulation:    Pain in feet at night that wakes you up from your sleep:     Blood clot in your veins:    Leg swelling:           PHYSICAL EXAM: Vitals:   07/10/20 0953  BP: 137/88  Pulse: 78  Temp: 97.9 F (36.6 C)  SpO2: 97%  Weight: 156 lb 3.2 oz (70.9 kg)  Height: 5\' 7"  (1.702 m)    GENERAL: The patient is a well-nourished male, in no acute distress. The vital signs are documented above. CARDIOVASCULAR: Carotid arteries without bruits bilaterally.   Well-healed right carotid incision.  2+ femoral pulses bilaterally Abdomen: Soft nontender no bruit noted PULMONARY: There is good air exchange  MUSCULOSKELETAL: There are no major deformities or cyanosis. NEUROLOGIC: No focal weakness or paresthesias are detected. SKIN: There are no ulcers or rashes noted. PSYCHIATRIC: The patient has a normal affect.  DATA:  CT scan was reviewed with the actual images being reviewed with the patient and his wife present.  This does show extremely calcified high-grade stenosis at the origin of his.  Mesenteric artery.  His celiac artery is widely patent.  He has a very large inferior mesenteric artery with well-developed collaterals.  There may be stenosis at the origin.  This is difficult to tell by CT.  MEDICAL ISSUES: I discussed the significance of this with the patient.  He certainly has symptoms consistent with mesenteric ischemia.  He does have a high-grade SMA stenosis.  I have recommended arteriography for further evaluation.  Discussed possibility for stenting of his.  Mesenteric artery.  Also explained that he may have high-grade lesion of a very dominant large inferior mesenteric artery that as well which will be reassessed.  We will schedule outpatient arteriography and possible mesenteric stenting at his earliest convenience.  Also discussed potential for bypass depending on the appearance of the arteriogram.    Rosetta Posner, MD FACS [facility] [place] Office Tel [phone]  Note: Portions of this report may have been transcribed using voice recognition software.  Every effort has been made to ensure accuracy; however, inadvertent computerized transcription errors may still be present.

## 2020-07-10 NOTE — H&P (View-Only) (Signed)
[facility] [place]  Patient name: Jimmy Duran MRN: [id number] DOB: Dec 07, 1955 Sex: male  REASON FOR VISIT: Discuss CT showing superior mesenteric artery stenosis  HPI: Jimmy Duran is a 65 y.o. male known to me from prior right carotid endarterectomy for symptomatic disease.  He did well.  He reports that over the past 6 months he has been losing weight and having abdominal pain.  He reports that he is lifelong had issues with abdominal discomfort.  He reports this is become more prevalent and reports that he does have pain occasionally related to eating.  He reports that on occasion he can eat without difficulty but it on other times he has to stop eating soon after beginning for abdominal pain.  He does not have any change in bowel habits.  Past Medical History:  Diagnosis Date  . AAA (abdominal aortic aneurysm) ([place])   . Arthritis   . Carotid artery disease ([place])   . Colon polyp   . COPD (chronic obstructive pulmonary disease) ([place])   . Coronary artery disease    Nonobstructive  . Essential hypertension   . GERD (gastroesophageal reflux disease)   . Hemorrhoids   . History of TIA (transient ischemic attack)   . Low back pain   . Lumbar radiculopathy   . Mixed hyperlipidemia   . Palpitations   . Peripheral vascular disease ([place])   . Shingles 09/23/2019  . Type 2 diabetes mellitus (HCC)     Family History  Problem Relation Age of Onset  . Colon polyps Mother   . Coronary artery disease Father        CABG in his 62's  . Colon cancer Other        maternal great aunt  . Colon cancer Other        paternal great aunt    SOCIAL HISTORY: Social History   Tobacco Use  . Smoking status: Current Every Day Smoker    Packs/day: 0.50    Years: 30.00    Pack years: 15.00    Types: Cigarettes  . Smokeless tobacco: Former Systems developer    Types: Chew  . Tobacco comment: 1 pk per day.   Substance Use Topics  .  Alcohol use: Not Currently    Alcohol/week: 0.0 standard drinks    Comment: socially "beer and tomato juice"     No Known Allergies  Current Outpatient Medications  Medication Sig Dispense Refill  . aspirin EC 81 MG tablet Take 81 mg by mouth daily.    . Aspirin-Salicylamide-Caffeine (BC HEADACHE POWDER PO) Take 1 Package by mouth daily as needed (headache).    Marland Kitchen buPROPion (WELLBUTRIN SR) 150 MG 12 hr tablet Take 150 mg by mouth 2 (two) times daily.    . diphenhydrAMINE (BENADRYL) 25 MG tablet Take 25 mg by mouth daily as needed for allergies.     Marland Kitchen lisinopril-hydrochlorothiazide (ZESTORETIC) 20-12.5 MG tablet Take 1 tablet by mouth daily. 90 tablet 3  . metFORMIN (GLUCOPHAGE) 1000 MG tablet Take 1,000 mg by mouth 2 (two) times daily with a meal.    . NOVOLIN N 100 UNIT/ML injection Inject 25 Units into the skin 2 (two) times daily before a meal.     . rosuvastatin (CRESTOR) 40 MG tablet Take 1 tablet (40 mg total) by mouth daily. 90 tablet 2  . metoprolol tartrate (LOPRESSOR) 25 MG tablet Take 1 tablet (25 mg total) by mouth 2 (two) times daily. 180 tablet 3  No current facility-administered medications for this visit.    REVIEW OF SYSTEMS:  [X]  denotes positive finding, [ ]  denotes negative finding Cardiac  Comments:  Chest pain or chest pressure:    Shortness of breath upon exertion:    Short of breath when lying flat:    Irregular heart rhythm:        Vascular    Pain in calf, thigh, or hip brought on by ambulation:    Pain in feet at night that wakes you up from your sleep:     Blood clot in your veins:    Leg swelling:           PHYSICAL EXAM: Vitals:   07/10/20 0953  BP: 137/88  Pulse: 78  Temp: 97.9 F (36.6 C)  SpO2: 97%  Weight: 156 lb 3.2 oz (70.9 kg)  Height: 5\' 7"  (1.702 m)    GENERAL: The patient is a well-nourished male, in no acute distress. The vital signs are documented above. CARDIOVASCULAR: Carotid arteries without bruits bilaterally.   Well-healed right carotid incision.  2+ femoral pulses bilaterally Abdomen: Soft nontender no bruit noted PULMONARY: There is good air exchange  MUSCULOSKELETAL: There are no major deformities or cyanosis. NEUROLOGIC: No focal weakness or paresthesias are detected. SKIN: There are no ulcers or rashes noted. PSYCHIATRIC: The patient has a normal affect.  DATA:  CT scan was reviewed with the actual images being reviewed with the patient and his wife present.  This does show extremely calcified high-grade stenosis at the origin of his.  Mesenteric artery.  His celiac artery is widely patent.  He has a very large inferior mesenteric artery with well-developed collaterals.  There may be stenosis at the origin.  This is difficult to tell by CT.  MEDICAL ISSUES: I discussed the significance of this with the patient.  He certainly has symptoms consistent with mesenteric ischemia.  He does have a high-grade SMA stenosis.  I have recommended arteriography for further evaluation.  Discussed possibility for stenting of his.  Mesenteric artery.  Also explained that he may have high-grade lesion of a very dominant large inferior mesenteric artery that as well which will be reassessed.  We will schedule outpatient arteriography and possible mesenteric stenting at his earliest convenience.  Also discussed potential for bypass depending on the appearance of the arteriogram.    Rosetta Posner, MD FACS [facility] Office Tel [phone]  Note: Portions of this report may have been transcribed using voice recognition software.  Every effort has been made to ensure accuracy; however, inadvertent computerized transcription errors may still be present.

## 2020-07-12 ENCOUNTER — Other Ambulatory Visit: Payer: Self-pay

## 2020-07-14 ENCOUNTER — Other Ambulatory Visit: Payer: Self-pay

## 2020-07-14 ENCOUNTER — Other Ambulatory Visit
Admission: RE | Admit: 2020-07-14 | Discharge: 2020-07-14 | Disposition: A | Discharge status: Home / Self Care | Payer: Medicare HMO | Source: Ambulatory Visit | Attending: Vascular Surgery | Admitting: Vascular Surgery

## 2020-07-14 DIAGNOSIS — Z01812 Encounter for preprocedural laboratory examination: Secondary | ICD-10-CM | POA: Insufficient documentation

## 2020-07-14 DIAGNOSIS — Z20822 Contact with and (suspected) exposure to covid-19: Secondary | ICD-10-CM | POA: Insufficient documentation

## 2020-07-14 LAB — SARS CORONAVIRUS 2 (TAT 6-24 HRS): SARS Coronavirus 2: NEGATIVE

## 2020-07-17 ENCOUNTER — Ambulatory Visit (HOSPITAL_COMMUNITY)
Admission: RE | Admit: 2020-07-17 | Discharge: 2020-07-17 | Disposition: A | Discharge status: Home / Self Care | Payer: Medicare HMO | Attending: Vascular Surgery | Admitting: Vascular Surgery

## 2020-07-17 ENCOUNTER — Encounter (HOSPITAL_COMMUNITY)
Admission: RE | Disposition: A | Discharge status: Home / Self Care | Payer: Self-pay | Source: Home / Self Care | Attending: Vascular Surgery

## 2020-07-17 ENCOUNTER — Other Ambulatory Visit: Payer: Self-pay

## 2020-07-17 DIAGNOSIS — F17210 Nicotine dependence, cigarettes, uncomplicated: Secondary | ICD-10-CM | POA: Insufficient documentation

## 2020-07-17 DIAGNOSIS — Z7984 Long term (current) use of oral hypoglycemic drugs: Secondary | ICD-10-CM | POA: Diagnosis not present

## 2020-07-17 DIAGNOSIS — K55059 Acute (reversible) ischemia of intestine, part and extent unspecified: Secondary | ICD-10-CM

## 2020-07-17 DIAGNOSIS — Z7982 Long term (current) use of aspirin: Secondary | ICD-10-CM | POA: Insufficient documentation

## 2020-07-17 DIAGNOSIS — K551 Chronic vascular disorders of intestine: Secondary | ICD-10-CM | POA: Insufficient documentation

## 2020-07-17 DIAGNOSIS — Z79899 Other long term (current) drug therapy: Secondary | ICD-10-CM | POA: Insufficient documentation

## 2020-07-17 DIAGNOSIS — Z794 Long term (current) use of insulin: Secondary | ICD-10-CM | POA: Diagnosis not present

## 2020-07-17 HISTORY — PX: VISCERAL ANGIOGRAPHY: CATH118276

## 2020-07-17 HISTORY — PX: PERIPHERAL VASCULAR INTERVENTION: CATH118257

## 2020-07-17 LAB — POCT I-STAT, CHEM 8
BUN: 19 mg/dL (ref 8–23)
Calcium, Ion: 1.33 mmol/L (ref 1.15–1.40)
Chloride: 106 mmol/L (ref 98–111)
Creatinine, Ser: 1.3 mg/dL — ABNORMAL HIGH (ref 0.61–1.24)
Glucose, Bld: 274 mg/dL — ABNORMAL HIGH (ref 70–99)
HCT: 37 % — ABNORMAL LOW (ref 39.0–52.0)
Hemoglobin: 12.6 g/dL — ABNORMAL LOW (ref 13.0–17.0)
Potassium: 4.6 mmol/L (ref 3.5–5.1)
Sodium: 137 mmol/L (ref 135–145)
TCO2: 22 mmol/L (ref 22–32)

## 2020-07-17 SURGERY — VISCERAL ANGIOGRAPHY
Anesthesia: LOCAL

## 2020-07-17 MED ORDER — SODIUM CHLORIDE 0.9 % IV SOLN: 250.0000 mL | INTRAVENOUS | Status: DC | PRN

## 2020-07-17 MED ORDER — CLOPIDOGREL BISULFATE 300 MG PO TABS
ORAL_TABLET | ORAL | Status: DC | PRN
  Administered 2020-07-17: 300 mg via ORAL

## 2020-07-17 MED ORDER — ONDANSETRON HCL 4 MG/2ML IJ SOLN: 4.0000 mg | Freq: Four times a day (QID) | INTRAMUSCULAR | Status: DC | PRN

## 2020-07-17 MED ORDER — CLOPIDOGREL BISULFATE 75 MG PO TABS: 300.0000 mg | ORAL_TABLET | Freq: Once | ORAL | Status: DC

## 2020-07-17 MED ORDER — HEPARIN (PORCINE) IN NACL 1000-0.9 UT/500ML-% IV SOLN
INTRAVENOUS | Status: AC
  Filled 2020-07-17: qty 500

## 2020-07-17 MED ORDER — CLOPIDOGREL BISULFATE 75 MG PO TABS: 75.0000 mg | ORAL_TABLET | Freq: Every day | ORAL | Status: DC

## 2020-07-17 MED ORDER — HEPARIN SODIUM (PORCINE) 1000 UNIT/ML IJ SOLN
INTRAMUSCULAR | Status: AC
  Filled 2020-07-17: qty 1

## 2020-07-17 MED ORDER — HEPARIN (PORCINE) IN NACL 1000-0.9 UT/500ML-% IV SOLN
INTRAVENOUS | Status: DC | PRN
  Administered 2020-07-17 (×2): 500 mL

## 2020-07-17 MED ORDER — FENTANYL CITRATE (PF) 100 MCG/2ML IJ SOLN
INTRAMUSCULAR | Status: DC | PRN
  Administered 2020-07-17: 25 ug via INTRAVENOUS
  Administered 2020-07-17: 50 ug via INTRAVENOUS

## 2020-07-17 MED ORDER — CLOPIDOGREL BISULFATE 300 MG PO TABS
ORAL_TABLET | ORAL | Status: AC
  Filled 2020-07-17: qty 1

## 2020-07-17 MED ORDER — OXYCODONE HCL 5 MG PO TABS
5.0000 mg | ORAL_TABLET | ORAL | Status: DC | PRN
  Administered 2020-07-17: 5 mg via ORAL

## 2020-07-17 MED ORDER — MIDAZOLAM HCL 2 MG/2ML IJ SOLN
INTRAMUSCULAR | Status: AC
  Filled 2020-07-17: qty 2

## 2020-07-17 MED ORDER — SODIUM CHLORIDE 0.9 % IV SOLN
INTRAVENOUS | Status: AC | PRN
  Administered 2020-07-17: 250 mL via INTRAVENOUS

## 2020-07-17 MED ORDER — MORPHINE SULFATE (PF) 2 MG/ML IV SOLN: 2.0000 mg | INTRAVENOUS | Status: DC | PRN

## 2020-07-17 MED ORDER — OXYCODONE HCL 5 MG PO TABS
ORAL_TABLET | ORAL | Status: AC
  Filled 2020-07-17: qty 2

## 2020-07-17 MED ORDER — FENTANYL CITRATE (PF) 100 MCG/2ML IJ SOLN
INTRAMUSCULAR | Status: AC
  Filled 2020-07-17: qty 2

## 2020-07-17 MED ORDER — IODIXANOL 320 MG/ML IV SOLN
INTRAVENOUS | Status: DC | PRN
Start: 2020-07-17 — End: 2020-07-17
  Administered 2020-07-17: 65 mL

## 2020-07-17 MED ORDER — CLOPIDOGREL BISULFATE 75 MG PO TABS: 75.0000 mg | ORAL_TABLET | Freq: Every day | ORAL | 11 refills | Status: DC

## 2020-07-17 MED ORDER — LIDOCAINE HCL (PF) 1 % IJ SOLN
INTRAMUSCULAR | Status: AC
  Filled 2020-07-17: qty 30

## 2020-07-17 MED ORDER — HYDRALAZINE HCL 20 MG/ML IJ SOLN: 5.0000 mg | INTRAMUSCULAR | Status: DC | PRN

## 2020-07-17 MED ORDER — LIDOCAINE HCL (PF) 1 % IJ SOLN
INTRAMUSCULAR | Status: DC | PRN
  Administered 2020-07-17: 15 mL via INTRADERMAL

## 2020-07-17 MED ORDER — SODIUM CHLORIDE 0.9 % IV SOLN: INTRAVENOUS | Status: DC

## 2020-07-17 MED ORDER — SODIUM CHLORIDE 0.9% FLUSH: 3.0000 mL | Freq: Two times a day (BID) | INTRAVENOUS | Status: DC

## 2020-07-17 MED ORDER — ACETAMINOPHEN 325 MG PO TABS: 650.0000 mg | ORAL_TABLET | ORAL | Status: DC | PRN

## 2020-07-17 MED ORDER — LABETALOL HCL 5 MG/ML IV SOLN: 10.0000 mg | INTRAVENOUS | Status: DC | PRN

## 2020-07-17 MED ORDER — HEPARIN SODIUM (PORCINE) 1000 UNIT/ML IJ SOLN
INTRAMUSCULAR | Status: DC | PRN
  Administered 2020-07-17: 6000 [IU] via INTRAVENOUS

## 2020-07-17 MED ORDER — MIDAZOLAM HCL 2 MG/2ML IJ SOLN
INTRAMUSCULAR | Status: DC | PRN
  Administered 2020-07-17: 1 mg via INTRAVENOUS

## 2020-07-17 MED ORDER — SODIUM CHLORIDE 0.9% FLUSH: 3.0000 mL | INTRAVENOUS | Status: DC | PRN

## 2020-07-17 SURGICAL SUPPLY — 21 items
BALLN MUSTANG 5.0X40 75 (BALLOONS) ×3
BALLOON MUSTANG 5.0X40 75 (BALLOONS) IMPLANT
CATH OMNI FLUSH 5F 65CM (CATHETERS) ×1 IMPLANT
CATH QUICKCROSS SUPP .035X90CM (MICROCATHETER) ×1 IMPLANT
CLOSURE MYNX CONTROL 6F/7F (Vascular Products) ×1 IMPLANT
GLIDEWIRE ADV .035X260CM (WIRE) ×1 IMPLANT
KIT ENCORE 26 ADVANTAGE (KITS) ×2 IMPLANT
KIT MICROPUNCTURE NIT STIFF (SHEATH) ×1 IMPLANT
KIT PV (KITS) ×3 IMPLANT
SHEATH GUIDING 7F 55X73X9MM TD (SHEATH) ×1 IMPLANT
SHEATH PINNACLE 6F 10CM (SHEATH) ×1 IMPLANT
SHEATH PINNACLE 7F 10CM (SHEATH) ×1 IMPLANT
SHEATH PROBE COVER 6X72 (BAG) ×1 IMPLANT
STENT EXPRESS LD 7X27X75 (Permanent Stent) ×1 IMPLANT
STENT VIABAHN 7X39X80 VBX (Permanent Stent) ×1 IMPLANT
SYR MEDRAD MARK V 150ML (SYRINGE) ×1 IMPLANT
TRANSDUCER W/STOPCOCK (MISCELLANEOUS) ×3 IMPLANT
TRAY PV CATH (CUSTOM PROCEDURE TRAY) ×3 IMPLANT
WIRE BENTSON .035X145CM (WIRE) ×1 IMPLANT
WIRE ROSEN-J .035X180CM (WIRE) ×1 IMPLANT
WIRE ROSEN-J .035X260CM (WIRE) ×1 IMPLANT

## 2020-07-17 NOTE — Interval H&P Note (Signed)
History and Physical Interval Note:  07/17/2020 7:14 AM  Jimmy Duran  has presented today for surgery, with the diagnosis of sma stenosis.  The various methods of treatment have been discussed with the patient and family. After consideration of risks, benefits and other options for treatment, the patient has consented to  Procedure(s): mesenteric ANGIOGRAPHY (N/A) as a surgical intervention.  The patient's history has been reviewed, patient examined, no change in status, stable for surgery.  I have reviewed the patient's chart and labs.  Questions were answered to the patient's satisfaction.     Servando Snare

## 2020-07-17 NOTE — Op Note (Signed)
    Patient name: Jimmy Duran MRN: [id number] DOB: December 27, 1955 Sex: male  07/17/2020 Pre-operative Diagnosis: Chronic mesenteric ischemia Post-operative diagnosis:  Same Surgeon:  Erlene Quan C. Donzetta Matters, MD Procedure Performed: 1.  Ultrasound-guided cannulation right common femoral artery 2.  Aortogram 3.  Stent of SMA with proximal 7 x 39 mm VBX extended distally with 7 x 27 mm express LD 4.  Moderate sedation with fentanyl and Versed for 47 minutes 5.  Mynx device closure right common femoral artery   Indications: 65 year old male with history of chronic mesenteric ischemia.  He has occluded SMA and IMA by CT angio.  He is now indicated for aortogram with possible intervention.  Findings: The aorta was calcified.  Celiac artery was patent.  The SMA was occluded for approximately 2-1/2 cm.  The IMA appears to have a tight stenosis although this was difficult to visualize the IMA is much larger distally.  After stenting proximally with a covered stent there appeared to be a dissection in the distal vessel this was covered with a noncovered stent distally.  There is 0% residual stenosis and brisk flow through the previously occluded SMA at completion.   Procedure:  The patient was identified in the holding area and taken to room 8.  The patient was then placed supine on the table and prepped and draped in the usual sterile fashion.  A time out was called.  Ultrasound was used to evaluate the right common femoral artery which was noted to be patent.  There was plaque posterior.  There is anesthetized 1% lidocaine.  Moderate sedation with fentanyl and Versed was administered and his vitals were monitored throughout the course of the case.  The right common femoral artery was cannulated micropuncture needle with direct ultrasound visualization and images saved to the permanent record.  A wire and sheath were placed.  Bentson wire was placed followed by 6 [place] sheath.  Omni catheter was placed to the  level of T12 aortogram was performed in a lateral position.  We then exchanged for a steerable 7 [place] Oscor sheath.  Patient was fully heparinized.  We use Glidewire advantage were able to cross distally into the SMA placed a quick cross catheter confirm intraluminal access.  We then primarily dilated with a 5 mm x 4 cm balloon.  We then placed a 7 x 39 stent extending from the aorta distally.  At completion there appeared to possibly be a dissection plane.  We extended this with a 7 x 27 mm noncovered stent.  Completion demonstrated no residual dissection.  Where previously was occluded was now patent and filled distally briskly.  We removed our wires.  We exchanged for a short 7 [place] sheath deployed a minx device.  He tolerated procedure without any complication   Contrast: 65cc   C. Donzetta Matters, MD [facility] Office: [phone] Pager: [phone]

## 2020-07-17 NOTE — Progress Notes (Signed)
Up and walked and tolerated well; right groin stable, no bleeding or hematoma 

## 2020-07-17 NOTE — Discharge Instructions (Signed)
NO METFORMIN/ GLUCOPHAGE FOR 2 DAYS     Femoral Site Care  This sheet gives you information about how to care for yourself after your procedure. Your health care provider may also give you more specific instructions. If you have problems or questions, contact your health care provider. What can I expect after the procedure? After the procedure, it is common to have:  Bruising that usually fades within 1-2 weeks.  Tenderness at the site. Follow these instructions at home: Wound care  Follow instructions from your health care provider about how to take care of your insertion site. Make sure you: ? Wash your hands with soap and water before you change your bandage (dressing). If soap and water are not available, use hand sanitizer. ? Change your dressing as told by your health care provider. ? Leave stitches (sutures), skin glue, or adhesive strips in place. These skin closures may need to stay in place for 2 weeks or longer. If adhesive strip edges start to loosen and curl up, you may trim the loose edges. Do not remove adhesive strips completely unless your health care provider tells you to do that.  Do not take baths, swim, or use a hot tub until your health care provider approves.  You may shower 24-48 hours after the procedure or as told by your health care provider. ? Gently wash the site with plain soap and water. ? Pat the area dry with a clean towel. ? Do not rub the site. This may cause bleeding.  Do not apply powder or lotion to the site. Keep the site clean and dry.  Check your femoral site every day for signs of infection. Check for: ? Redness, swelling, or pain. ? Fluid or blood. ? Warmth. ? Pus or a bad smell. Activity  For the first 2-3 days after your procedure, or as long as directed: ? Avoid climbing stairs as much as possible. ? Do not squat.  Do not lift anything that is heavier than 10 lb (4.5 kg), or the limit that you are told, until your health care  provider says that it is safe.  Rest as directed. ? Avoid sitting for a long time without moving. Get up to take short walks every 1-2 hours.  Do not drive for 24 hours if you were given a medicine to help you relax (sedative). General instructions  Take over-the-counter and prescription medicines only as told by your health care provider.  Keep all follow-up visits as told by your health care provider. This is important. Contact a health care provider if you have:  A fever or chills.  You have redness, swelling, or pain around your insertion site. Get help right away if:  The catheter insertion area swells very fast.  You pass out.  You suddenly start to sweat or your skin gets clammy.  The catheter insertion area is bleeding, and the bleeding does not stop when you hold steady pressure on the area.  The area near or just beyond the catheter insertion site becomes pale, cool, tingly, or numb. These symptoms may represent a serious problem that is an emergency. Do not wait to see if the symptoms will go away. Get medical help right away. Call your local emergency services (911 in the [place] Do not drive yourself to the hospital. Summary  After the procedure, it is common to have bruising that usually fades within 1-2 weeks.  Check your femoral site every day for signs of infection.  Do not lift   anything that is heavier than 10 lb (4.5 kg), or the limit that you are told, until your health care provider says that it is safe. This information is not intended to replace advice given to you by your health care provider. Make sure you discuss any questions you have with your health care provider. Document Revised: 10/22/2019 Document Reviewed: 10/22/2019 Elsevier Patient Education  2021 [employer] Inc.  

## 2020-07-18 ENCOUNTER — Encounter (HOSPITAL_COMMUNITY): Payer: Self-pay | Admitting: Vascular Surgery

## 2020-07-21 ENCOUNTER — Telehealth: Payer: Self-pay

## 2020-07-21 NOTE — Telephone Encounter (Signed)
Pt's wife Estill Bamberg called to ask if pt could bathe today s/p AGM on Monday. She also needed his f/u appt info. She stated area is bruised but is clean/dry/intact and without redness. No further questions/concerns at this time.

## 2020-07-27 ENCOUNTER — Other Ambulatory Visit: Payer: Self-pay | Admitting: *Deleted

## 2020-07-27 DIAGNOSIS — K551 Chronic vascular disorders of intestine: Secondary | ICD-10-CM

## 2020-07-29 ENCOUNTER — Encounter (HOSPITAL_COMMUNITY): Payer: Self-pay | Admitting: *Deleted

## 2020-07-29 ENCOUNTER — Other Ambulatory Visit: Payer: Self-pay

## 2020-07-29 ENCOUNTER — Emergency Department (HOSPITAL_COMMUNITY)
Admission: EM | Admit: 2020-07-29 | Discharge: 2020-07-29 | Disposition: A | Discharge status: Home / Self Care | Payer: Medicare HMO | Attending: Emergency Medicine | Admitting: Emergency Medicine

## 2020-07-29 DIAGNOSIS — Z5321 Procedure and treatment not carried out due to patient leaving prior to being seen by health care provider: Secondary | ICD-10-CM | POA: Insufficient documentation

## 2020-07-29 DIAGNOSIS — R21 Rash and other nonspecific skin eruption: Secondary | ICD-10-CM | POA: Insufficient documentation

## 2020-07-29 NOTE — ED Triage Notes (Signed)
Pt woke up with rash that itches all over.  Denies fever.  Denies any soaps or lotions, on new medication for blood thinner.

## 2020-08-14 ENCOUNTER — Ambulatory Visit: Payer: Medicare HMO | Admitting: Vascular Surgery

## 2020-08-21 ENCOUNTER — Encounter: Payer: Self-pay | Admitting: Vascular Surgery

## 2020-08-21 ENCOUNTER — Ambulatory Visit (INDEPENDENT_AMBULATORY_CARE_PROVIDER_SITE_OTHER): Payer: Medicare HMO | Admitting: Vascular Surgery

## 2020-08-21 ENCOUNTER — Ambulatory Visit (INDEPENDENT_AMBULATORY_CARE_PROVIDER_SITE_OTHER): Payer: Medicare HMO

## 2020-08-21 ENCOUNTER — Ambulatory Visit: Payer: Medicare HMO | Admitting: Vascular Surgery

## 2020-08-21 ENCOUNTER — Other Ambulatory Visit (HOSPITAL_COMMUNITY): Payer: Self-pay | Admitting: Vascular Surgery

## 2020-08-21 ENCOUNTER — Other Ambulatory Visit: Payer: Self-pay

## 2020-08-21 VITALS — BP 119/78 | HR 93 | Temp 98.3°F | Resp 16 | Ht 67.0 in | Wt 147.0 lb

## 2020-08-21 DIAGNOSIS — I6523 Occlusion and stenosis of bilateral carotid arteries: Secondary | ICD-10-CM

## 2020-08-21 DIAGNOSIS — I6521 Occlusion and stenosis of right carotid artery: Secondary | ICD-10-CM | POA: Diagnosis not present

## 2020-08-21 DIAGNOSIS — K551 Chronic vascular disorders of intestine: Secondary | ICD-10-CM

## 2020-08-21 NOTE — Progress Notes (Signed)
[facility]  Patient name: Jimmy Duran MRN: [id number] DOB: [birth year] Sex: male  REASON FOR VISIT: Follow-up carotid arterectomy and superior mesenteric artery  HPI: Jimmy Duran is a 65 y.o. male here today for follow-up.  He underwent right carotid endarterectomy for severe asymptomatic disease on 10/08/2019.  He underwent carotid duplex today for surveillance of this  He also presented with persistent abdominal pain and underwent arteriography showing superior mesenteric artery stenosis and underwent stenting of this in 07/17/2020.  He feels that he has had some improvement in his abdominal pain.  It is difficult since he has had lifelong issues with abdominal pain.  He reports that he feels that he is eating some better.  Past Medical History:  Diagnosis Date   AAA (abdominal aortic aneurysm) (HCC)    Arthritis    Carotid artery disease (HCC)    Colon polyp    COPD (chronic obstructive pulmonary disease) (HCC)    Coronary artery disease    Nonobstructive   Essential hypertension    GERD (gastroesophageal reflux disease)    Hemorrhoids    History of TIA (transient ischemic attack)    Low back pain    Lumbar radiculopathy    Mixed hyperlipidemia    Palpitations    Peripheral vascular disease ([place])    Shingles 09/23/2019   Type 2 diabetes mellitus (HCC)     Family History  Problem Relation Age of Onset   Colon polyps Mother    Coronary artery disease Father        CABG in his 4's   Colon cancer Other        maternal great aunt   Colon cancer Other        paternal great aunt    SOCIAL HISTORY: Social History   Tobacco Use   Smoking status: Every Day    Packs/day: 0.50    Years: 30.00    Pack years: 15.00    Types: Cigarettes   Smokeless tobacco: Former    Types: Chew   Tobacco comments:    1 pk per day.   Substance Use Topics   Alcohol use: Not Currently    Alcohol/week: 0.0  standard drinks    Comment: socially "beer and tomato juice"     No Known Allergies  Current Outpatient Medications  Medication Sig Dispense Refill   aspirin EC 81 MG tablet Take 81 mg by mouth daily.     buPROPion (WELLBUTRIN SR) 150 MG 12 hr tablet Take 150 mg by mouth 2 (two) times daily.     busPIRone (BUSPAR) 5 MG tablet Take 5 mg by mouth 2 (two) times daily.     clopidogrel (PLAVIX) 75 MG tablet Take 1 tablet (75 mg total) by mouth daily. 30 tablet 11   diphenhydrAMINE (BENADRYL) 25 MG tablet Take 25 mg by mouth daily as needed for allergies.      lisinopril-hydrochlorothiazide (ZESTORETIC) 20-12.5 MG tablet Take 1 tablet by mouth daily. 90 tablet 3   metFORMIN (GLUCOPHAGE) 1000 MG tablet Take 1,000 mg by mouth 2 (two) times daily with a meal.     NOVOLIN N 100 UNIT/ML injection Inject 25 Units into the skin 2 (two) times daily before a meal.      rosuvastatin (CRESTOR) 40 MG tablet Take 1 tablet (40 mg total) by mouth daily. 90 tablet 2   metoprolol tartrate (LOPRESSOR) 25 MG tablet Take 1 tablet (25 mg total) by mouth 2 (two) times daily.  180 tablet 3   No current facility-administered medications for this visit.    REVIEW OF SYSTEMS:  [X]  denotes positive finding, [ ]  denotes negative finding Cardiac  Comments:  Chest pain or chest pressure:    Shortness of breath upon exertion:    Short of breath when lying flat:    Irregular heart rhythm:        Vascular    Pain in calf, thigh, or hip brought on by ambulation:    Pain in feet at night that wakes you up from your sleep:     Blood clot in your veins:    Leg swelling:           PHYSICAL EXAM: Vitals:   08/21/20 0902  BP: 119/78  Pulse: 93  Resp: 16  Temp: 98.3 F (36.8 C)  TempSrc: Other (Comment)  SpO2: 92%  Weight: 147 lb (66.7 kg)  Height: 5\' 7"  (1.702 m)    GENERAL: The patient is a well-nourished male, in no acute distress. The vital signs are documented above. CARDIOVASCULAR: Carotid arteries  without bruits bilaterally.  Well-healed right carotid incision.  No abdominal bruit PULMONARY: There is good air exchange  MUSCULOSKELETAL: There are no major deformities or cyanosis. NEUROLOGIC: No focal weakness or paresthesias are detected. SKIN: There are no ulcers or rashes noted. PSYCHIATRIC: The patient has a normal affect.  DATA:  Carotid duplex today reveals widely patent endarterectomy on the right.  Moderate stenosis on the left.  Does have elevated end-diastolic velocities  Abdominal duplex reveals patency of his SMA stent.  He does have some elevated velocities distal to this.  MEDICAL ISSUES: Stable overall.  Regarding his carotid disease we will see him again in 1 year with repeat carotid duplex  Subjectively does feel that he has some improvement in his abdomen.  Would not recommend any treatment currently and will follow this symptomatically.    Rosetta Posner, MD FACS [facility] [phone]  Note: Portions of this report may have been transcribed using voice recognition software.  Every effort has been made to ensure accuracy; however, inadvertent computerized transcription errors may still be present.

## 2020-08-23 ENCOUNTER — Telehealth: Payer: Self-pay | Admitting: Family Medicine

## 2020-08-23 NOTE — Telephone Encounter (Signed)
    Covering for Dr. Domenic Polite - Agree with recommendations to hold Lisinopril-HCTZ for now and monitor BP. Was well-controlled at 119/78 when he was evaluated by Vascular on 08/21/2020. If blood pressure remains soft, we may need to reduce Lopressor as well but would follow readings in the interim.  Signed, Erma Heritage, PA-C 08/23/2020, 10:48 AM Pager: [phone]

## 2020-08-23 NOTE — Telephone Encounter (Signed)
Per wife, Jimmy Duran, BP low at PCP visit yesterday 70/50 and PCP made no medication adjustment.  No working BP at home but says one will be purchased today to monitor BP at home Reports staying well hydrated Medications reviewed Advised to hold lisinopril/hctz 20/12.5 mg, monitor BP daily for next week and call office with readings and update on symptoms. Advised if symptoms get worse, to go to the ED for an evaluation Verbalized understanding of plan.

## 2020-08-23 NOTE — Telephone Encounter (Signed)
New message     Pt c/o BP issue: STAT if pt c/o blurred vision, one-sided weakness or slurred speech  1. What are your last 5 BP readings? Yesterday it was 70/50 at PCP  in left arm and 70/49 in right arm    2. Are you having any other symptoms (ex. Dizziness, headache, blurred vision, passed out)?  Has been having dizziness - 3. What is your BP issue? BP is low and he has been having this for awhile thought it was inner ear infection , went to PCP and they said to call you

## 2020-08-28 ENCOUNTER — Other Ambulatory Visit: Payer: Self-pay | Admitting: Family Medicine

## 2020-11-20 NOTE — Progress Notes (Signed)
Referring Provider: Abran Richard, MD Primary Care Physician:  Abran Richard, MD Primary GI: Dr. Gala Romney  Chief Complaint  Patient presents with   Gastroesophageal Reflux    Heart burn really bad     HPI:   Jimmy Duran is a 65 y.o. male presenting today with a history of LLQ discomfort, chronic GERD, dysphagia improved s/p dilation. Last EGD in 2019 with esophagitis. Empiric dilation at that time. Last seen in 2020.    CT April 2022 with contrast noted mild wall thickening of distal esophagus. Significant atherosclerotic disease with high-grade stenosis of SMA that progressed since 2018. Mild narrowing at origin of IMA. He underwent stenting of SMA in May 2022.    Had been on OTC Nexium. Worsening GERD. Saw PCP a few months ago and placed on pantoprazole. Only thing that ever worked in past was prescription Nexium. Notes solid food dysphagia, mainly meats. Soft foods are ok. No odynophagia. Has had weight loss. Unintentionally. No abdominal pain. Notes primarily GERD issues. Sugar has been "out of whack" for awhile and getting under control. Was running 400-600, now in 36s. Will alternate between constipation and diarrhea. If constipated for awhile then will be loose. No rectal bleeding.   Rare beer socially. BC powders routinely.   Past Medical History:  Diagnosis Date   AAA (abdominal aortic aneurysm) (HCC)    Arthritis    Carotid artery disease (HCC)    Colon polyp    COPD (chronic obstructive pulmonary disease) (HCC)    Coronary artery disease    Nonobstructive   Essential hypertension    GERD (gastroesophageal reflux disease)    Hemorrhoids    History of TIA (transient ischemic attack)    Low back pain    Lumbar radiculopathy    Mixed hyperlipidemia    Palpitations    Peripheral vascular disease ([place])    Shingles 09/23/2019   Type 2 diabetes mellitus ([place])     Past Surgical History:  Procedure Laterality Date   APPENDECTOMY  1970   BIOPSY   12/08/2017   Procedure: BIOPSY;  Surgeon: [name], MD;  [facility];  Service: Endoscopy;;  esophagus    BREAST SURGERY Right    Cyst resection on the right   CARDIAC CATHETERIZATION  1990's   in [place]. no stent placement   CATARACT EXTRACTION W/PHACO Right 06/18/2016   Procedure: CATARACT EXTRACTION PHACO AND INTRAOCULAR LENS PLACEMENT ([place]);  Surgeon: [name], MD;  Location: [facility];  Service: Ophthalmology;  Laterality: Right;  [name] 35.7 AP% 16.6 CDE 5.94 Fluid pack lot # [id number]   CATARACT EXTRACTION W/PHACO Left 07/16/2016   Procedure: CATARACT EXTRACTION PHACO AND INTRAOCULAR LENS PLACEMENT ([facility]);  Surgeon: [name], MD;  Location: [facility];  Service: Ophthalmology;  Laterality: Left;  [name] 51.8 AP% 12.7 CDE 6.58 Fluid Pack Lot # [id number]   CHOLECYSTECTOMY  1993   COLONOSCOPY  02/12/2007   [id number] friable anal canal hemorrhoids, otherwise normal rectum and colon   COLONOSCOPY N/A 07/16/2012   RMR: Colonic diverticulosis. 4 mm tubular adenoma   COLONOSCOPY WITH PROPOFOL N/A 12/08/2017   Dr. Gala Romney: sigmoid and descending colon diverticulosis, transverse colon polyp (TUBULAR ADENOMA)   ENDARTERECTOMY Right 10/08/2019   Procedure: RIGHT CAROTID ENDARTERECTOMY;  Surgeon: Rosetta Posner, MD;  Location: [facility];  Service: Vascular;  Laterality: Right;   ESOPHAGOGASTRODUODENOSCOPY (EGD) WITH ESOPHAGEAL DILATION N/A 07/16/2012   RMR: +Candida esophagitis, Erosive reflux esophagiits. Schatizi's ring status post dilation.  Hiatal hernia. Antral erosions status post biopsy.    ESOPHAGOGASTRODUODENOSCOPY (EGD) WITH PROPOFOL N/A 12/08/2017   Dr. Gala Romney: esophagitis, query EOE but negative for increased eosinophils on path, small hiatal hernia, normal stomach, normal duodenum, PAS stain with rare yeast forms on stain. Empiric dilation   EYE SURGERY Bilateral 2018   cataract   HERNIA REPAIR     JOINT REPLACEMENT     KNEE SURGERY     Left knee arthroscopy  torn medial meniscus grade 4 chondral changes medial femoral condyle tibial plateau   MALONEY DILATION N/A 12/08/2017   Procedure: MALONEY DILATION;  Surgeon: [name], MD;  [facility];  Service: Endoscopy;  Laterality: N/A;   PATCH ANGIOPLASTY Right 10/08/2019   Procedure: PATCH ANGIOPLASTY of the right common carotid artery using hemashield plaltinum finesse patch;  Surgeon: Rosetta Posner, MD;  Location: [facility];  Service: Vascular;  Laterality: Right;   PERIPHERAL VASCULAR INTERVENTION  07/17/2020   Procedure: PERIPHERAL VASCULAR INTERVENTION;  Surgeon: Waynetta Sandy, MD;  Location: [facility];  Service: Cardiovascular;;   POLYPECTOMY  12/08/2017   Procedure: POLYPECTOMY;  Surgeon: [name], MD;  [facility];  Service: Endoscopy;;  colon   TOTAL KNEE ARTHROPLASTY Left 04/27/2012   Procedure: TOTAL KNEE ARTHROPLASTY;  Surgeon: Carole Civil, MD;  Location: [facility];  Service: Orthopedics;  Laterality: Left;   UMBILICAL HERNIA REPAIR  1993   VISCERAL ANGIOGRAPHY N/A 07/17/2020   Procedure: mesenteric ANGIOGRAPHY;  Surgeon: Waynetta Sandy, MD;  Location: [facility];  Service: Cardiovascular;  Laterality: N/A;    Current Outpatient Medications  Medication Sig Dispense Refill   aspirin EC 81 MG tablet Take 81 mg by mouth daily.     busPIRone (BUSPAR) 5 MG tablet Take 5 mg by mouth 2 (two) times daily.     clopidogrel (PLAVIX) 75 MG tablet Take 1 tablet (75 mg total) by mouth daily. 30 tablet 11   diphenhydrAMINE (BENADRYL) 25 MG tablet Take 25 mg by mouth daily as needed for allergies.      lisinopril-hydrochlorothiazide (ZESTORETIC) 20-12.5 MG tablet Take 1 tablet by mouth daily. 90 tablet 3   metFORMIN (GLUCOPHAGE) 1000 MG tablet Take 1,000 mg by mouth 2 (two) times daily with a meal.     NOVOLIN N 100 UNIT/ML injection Inject 25 Units into the skin 2 (two) times daily before a meal.      pantoprazole (PROTONIX) 40 MG  tablet Take 40 mg by mouth daily.     pantoprazole (PROTONIX) 40 MG tablet Take 1 tablet (40 mg total) by mouth 2 (two) times daily before a meal. 60 tablet 3   rosuvastatin (CRESTOR) 40 MG tablet Take 1 tablet by mouth once daily 90 tablet 3   metoprolol tartrate (LOPRESSOR) 25 MG tablet Take 1 tablet (25 mg total) by mouth 2 (two) times daily. 180 tablet 3   No current facility-administered medications for this visit.    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   Vaping Use   Vaping Use: Former  Substance and Sexual Activity   Alcohol use: Not Currently    Alcohol/week: 0.0 standard drinks    Comment: socially "beer and tomato juice"    Drug use: No   Sexual activity: Yes    Birth control/protection: None  Other Topics Concern   Not on file  Social History Narrative   Not on file   Social Determinants of Health   Financial Resource Strain: Not on file  Food Insecurity: Not on file  Transportation Needs: Not on file  Physical Activity: Not on file  Stress: Not on file  Social Connections: Not on file    Review of Systems: Gen: Denies fever, chills, anorexia. Denies fatigue, weakness, weight loss.  CV: Denies chest pain, palpitations,  syncope, peripheral edema, and claudication. Resp: Denies dyspnea at rest, cough, wheezing, coughing up blood, and pleurisy. GI: see HPI Derm: Denies rash, itching, dry skin Psych: Denies depression, anxiety, memory loss, confusion. No homicidal or suicidal ideation.  Heme: Denies bruising, bleeding, and enlarged lymph nodes.  Physical Exam: BP 104/84   Pulse 75   Temp (!) 97.2 F (36.2 C) (Temporal)   Ht '5\' 7"'$  (1.702 m)   Wt 147 lb 9.6 oz (67 kg)   BMI 23.12 kg/m  General:   Alert and oriented. No distress noted. Pleasant and cooperative.  Head:  Normocephalic and atraumatic. Eyes:  Conjuctiva clear without scleral icterus. Mouth:  mask in place Abdomen:  +BS, soft, TTP LUQ and epigastric and non-distended. No rebound or guarding. No HSM or masses noted. Msk:  Symmetrical without gross deformities. Normal posture. Extremities:  Without edema. Neurologic:  Alert and  oriented x4 Psych:  Alert and cooperative. Normal mood and affect.  ASSESSMENT: Jimmy Duran is a 65 y.o. male presenting today with history of chronic GERD, dysphagia with last empiric dilation in 2019, and known esophagitis on prior EGD. Now with worsening GERD, solid food dysphagia, and weight loss.   Notably, he had a CT April 2022 with contrast noted mild wall thickening of distal esophagus. Significant atherosclerotic disease with high-grade stenosis of SMA that had progressed since 2018. Mild narrowing at origin of IMA. He underwent stenting of SMA in May 2022. He is doing well without abdominal pain, food aversion.   Due to findings on CT of esophageal wall thickening, reported dysphagia, and worsening GERD ,will pursue EGD/dilation in near future. He is currently on Protonix daily and will increase this to BID.   He was encouraged to avoid any BC powders, as he takes this routinely.   PLAN:  Increase Protonix to BID Proceed with upper endoscopy/dilation by Dr. Gala Romney in near future using Propofol: the  risks, benefits, and alternatives have been discussed with the patient in detail. The patient states understanding and desires to proceed.  Avoid BC powders Return in follow-up thereafter  Annitta Needs, PhD, ANP-BC [facility]

## 2020-11-21 ENCOUNTER — Other Ambulatory Visit: Payer: Self-pay

## 2020-11-21 ENCOUNTER — Ambulatory Visit: Payer: Medicare HMO | Admitting: Gastroenterology

## 2020-11-21 ENCOUNTER — Encounter: Payer: Self-pay | Admitting: Gastroenterology

## 2020-11-21 ENCOUNTER — Encounter: Payer: Self-pay | Admitting: Internal Medicine

## 2020-11-21 VITALS — BP 104/84 | HR 75 | Temp 97.2°F | Ht 67.0 in | Wt 147.6 lb

## 2020-11-21 DIAGNOSIS — R1319 Other dysphagia: Secondary | ICD-10-CM | POA: Diagnosis not present

## 2020-11-21 DIAGNOSIS — K219 Gastro-esophageal reflux disease without esophagitis: Secondary | ICD-10-CM

## 2020-11-21 MED ORDER — PANTOPRAZOLE SODIUM 40 MG PO TBEC: 40.0000 mg | DELAYED_RELEASE_TABLET | Freq: Two times a day (BID) | ORAL | 3 refills | Status: DC

## 2020-11-21 NOTE — Patient Instructions (Signed)
We are arranging an endoscopy with dilation in the near future!  I have increased pantoprazole (Protonix) to twice a day, 30 minutes before breakfast and dinner.   It's important to avoid BC powders due to risk of ulcers and inflammation in your GI tract.   You can take Benefiber 2 teaspoons once to three times a day mixed with your beverage of choice to help with better bowel regimen.  This is over the counter.  We will see you back after the endoscopy!  I enjoyed seeing you again today! As you know, I value our relationship and want to provide genuine, compassionate, and quality care. I welcome your feedback. If you receive a survey regarding your visit,  I greatly appreciate you taking time to fill this out. See you next time!  Annitta Needs, PhD, ANP-BC [facility]

## 2020-12-14 ENCOUNTER — Telehealth: Payer: Self-pay

## 2020-12-14 NOTE — Telephone Encounter (Signed)
Called pt, spoke to his wife, EGD/DIL w/Propofol ASA 3 w/Dr. Gala Romney scheduled for 12/28/20 at 8:00am. Orders entered.  PA for EGD submitted via [employer] website. [employer]# [id number], valid 12/28/20-01/27/21.

## 2020-12-14 NOTE — Telephone Encounter (Signed)
Pre-op appt 12/27/20. Appt letter mailed with procedure instructions.

## 2020-12-23 LAB — HEMOGLOBIN A1C: Hemoglobin A1C: 9.5

## 2020-12-27 ENCOUNTER — Telehealth: Payer: Self-pay | Admitting: *Deleted

## 2020-12-27 ENCOUNTER — Encounter (HOSPITAL_COMMUNITY): Payer: Self-pay

## 2020-12-27 ENCOUNTER — Encounter (HOSPITAL_COMMUNITY)
Admission: RE | Admit: 2020-12-27 | Discharge: 2020-12-27 | Disposition: A | Discharge status: Home / Self Care | Payer: Medicare HMO | Source: Ambulatory Visit | Attending: Internal Medicine | Admitting: Internal Medicine

## 2020-12-27 ENCOUNTER — Other Ambulatory Visit: Payer: Self-pay

## 2020-12-27 VITALS — BP 134/83 | HR 79 | Temp 98.4°F | Resp 18 | Ht 67.0 in | Wt 147.0 lb

## 2020-12-27 DIAGNOSIS — E119 Type 2 diabetes mellitus without complications: Secondary | ICD-10-CM | POA: Insufficient documentation

## 2020-12-27 LAB — BASIC METABOLIC PANEL WITH GFR
Anion gap: 12 (ref 5–15)
BUN: 14 mg/dL (ref 8–23)
CO2: 20 mmol/L — ABNORMAL LOW (ref 22–32)
Calcium: 9.4 mg/dL (ref 8.9–10.3)
Chloride: 105 mmol/L (ref 98–111)
Creatinine, Ser: 1.12 mg/dL (ref 0.61–1.24)
GFR, Estimated: >60 mL/min
Glucose, Bld: 201 mg/dL — ABNORMAL HIGH (ref 70–99)
Potassium: 4.3 mmol/L (ref 3.5–5.1)
Sodium: 137 mmol/L (ref 135–145)

## 2020-12-27 NOTE — Patient Instructions (Signed)
[place]  12/27/2020     @PREFPERIOPPHARMACY @   Your procedure is scheduled on 12/28/2020.   Report to [facility] at  [phone] A.M.   Call this number if you have problems the morning of surgery:  [phone]   Remember:  Follow the diet instructions given to you by the office.    Take these medicines the morning of surgery with A SIP OF WATER            wellbutrin, metoprolol, protonix.     Do not wear jewelry, make-up or nail polish.  Do not wear lotions, powders, or perfumes, or deodorant.  Do not shave 48 hours prior to surgery.  Men may shave face and neck.  Do not bring valuables to the hospital.  [facility] is not responsible for any belongings or valuables.  Contacts, dentures or bridgework may not be worn into surgery.  Leave your suitcase in the car.  After surgery it may be brought to your room.  For patients admitted to the hospital, discharge time will be determined by your treatment team.  Patients discharged the day of surgery will not be allowed to drive home and must have someone with them for 24 hours.    Special instructions:   DO NOT smoke tobacco or vape for 24 hours before your procedure.  Please read over the following fact sheets that you were given. Anesthesia Post-op Instructions and Care and Recovery After Surgery      Upper Endoscopy, Adult, Care After This sheet gives you information about how to care for yourself after your procedure. Your health care provider may also give you more specific instructions. If you have problems or questions, contact your health care provider. What can I expect after the procedure? After the procedure, it is common to have: A sore throat. Mild stomach pain or discomfort. Bloating. Nausea. Follow these instructions at home:  Follow instructions from your health care provider about what to eat or drink after your procedure. Return to your normal activities as told by your health care  provider. Ask your health care provider what activities are safe for you. Take over-the-counter and prescription medicines only as told by your health care provider. If you were given a sedative during the procedure, it can affect you for several hours. Do not drive or operate machinery until your health care provider says that it is safe. Keep all follow-up visits as told by your health care provider. This is important. Contact a health care provider if you have: A sore throat that lasts longer than one day. Trouble swallowing. Get help right away if: You vomit blood or your vomit looks like coffee grounds. You have: A fever. Bloody, black, or tarry stools. A severe sore throat or you cannot swallow. Difficulty breathing. Severe pain in your chest or abdomen. Summary After the procedure, it is common to have a sore throat, mild stomach discomfort, bloating, and nausea. If you were given a sedative during the procedure, it can affect you for several hours. Do not drive or operate machinery until your health care provider says that it is safe. Follow instructions from your health care provider about what to eat or drink after your procedure. Return to your normal activities as told by your health care provider. This information is not intended to replace advice given to you by your health care provider. Make sure you discuss any questions you have with your health care  provider. Document Revised: 02/16/2019 Document Reviewed: 07/21/2017 Elsevier Patient Education  2022 [place]. Esophageal Dilatation Esophageal dilatation, also called esophageal dilation, is a procedure to widen or open a blocked or narrowed part of the esophagus. The esophagus is the part of the body that moves food and liquid from the mouth to the stomach. You may need this procedure if: You have a buildup of scar tissue in your esophagus that makes it difficult, painful, or impossible to swallow. This can be caused by  gastroesophageal reflux disease (GERD). You have cancer of the esophagus. There is a problem with how food moves through your esophagus. In some cases, you may need this procedure repeated at a later time to dilate the esophagus gradually. Tell a health care provider about: Any allergies you have. All medicines you are taking, including vitamins, herbs, eye drops, creams, and over-the-counter medicines. Any problems you or family members have had with anesthetic medicines. Any blood disorders you have. Any surgeries you have had. Any medical conditions you have. Any antibiotic medicines you are required to take before dental procedures. Whether you are pregnant or may be pregnant. What are the risks? Generally, this is a safe procedure. However, problems may occur, including: Bleeding due to a tear in the lining of the esophagus. A hole, or perforation, in the esophagus. What happens before the procedure? Ask your health care provider about: Changing or stopping your regular medicines. This is especially important if you are taking diabetes medicines or blood thinners. Taking medicines such as aspirin and ibuprofen. These medicines can thin your blood. Do not take these medicines unless your health care provider tells you to take them. Taking over-the-counter medicines, vitamins, herbs, and supplements. Follow instructions from your health care provider about eating or drinking restrictions. Plan to have a responsible adult take you home from the hospital or clinic. Plan to have a responsible adult care for you for the time you are told after you leave the hospital or clinic. This is important. What happens during the procedure? You may be given a medicine to help you relax (sedative). A numbing medicine may be sprayed into the back of your throat, or you may gargle the medicine. Your health care provider may perform the dilatation using various surgical instruments, such as: Simple  dilators. This instrument is carefully placed in the esophagus to stretch it. Guided wire bougies. This involves using an endoscope to insert a wire into the esophagus. A dilator is passed over this wire to enlarge the esophagus. Then the wire is removed. Balloon dilators. An endoscope with a small balloon is inserted into the esophagus. The balloon is inflated to stretch the esophagus and open it up. The procedure may vary among health care providers and hospitals. What can I expect after the procedure? Your blood pressure, heart rate, breathing rate, and blood oxygen level will be monitored until you leave the hospital or clinic. Your throat may feel slightly sore and numb. This will get better over time. You will not be allowed to eat or drink until your throat is no longer numb. When you are able to drink, urinate, and sit on the edge of the bed without nausea or dizziness, you may be able to return home. Follow these instructions at home: Take over-the-counter and prescription medicines only as told by your health care provider. If you were given a sedative during the procedure, it can affect you for several hours. Do not drive or operate machinery until your health care  provider says that it is safe. Plan to have a responsible adult care for you for the time you are told. This is important. Follow instructions from your health care provider about any eating or drinking restrictions. Do not use any products that contain nicotine or tobacco, such as cigarettes, e-cigarettes, and chewing tobacco. If you need help quitting, ask your health care provider. Keep all follow-up visits. This is important. Contact a health care provider if: You have a fever. You have pain that is not relieved by medicine. Get help right away if: You have chest pain. You have trouble breathing. You have trouble swallowing. You vomit blood. You have black, tarry, or bloody stools. These symptoms may represent a  serious problem that is an emergency. Do not wait to see if the symptoms will go away. Get medical help right away. Call your local emergency services (911 in the [place] Do not drive yourself to the hospital. Summary Esophageal dilatation, also called esophageal dilation, is a procedure to widen or open a blocked or narrowed part of the esophagus. Plan to have a responsible adult take you home from the hospital or clinic. For this procedure, a numbing medicine may be sprayed into the back of your throat, or you may gargle the medicine. Do not drive or operate machinery until your health care provider says that it is safe. This information is not intended to replace advice given to you by your health care provider. Make sure you discuss any questions you have with your health care provider. Document Revised: 07/07/2019 Document Reviewed: 07/07/2019 Elsevier Patient Education  [place] After This sheet gives you information about how to care for yourself after your procedure. Your health care provider may also give you more specific instructions. If you have problems or questions, contact your health care provider. What can I expect after the procedure? After the procedure, it is common to have: Tiredness. Forgetfulness about what happened after the procedure. Impaired judgment for important decisions. Nausea or vomiting. Some difficulty with balance. Follow these instructions at home: For the time period you were told by your health care provider:   Rest as needed. Do not participate in activities where you could fall or become injured. Do not drive or use machinery. Do not drink alcohol. Do not take sleeping pills or medicines that cause drowsiness. Do not make important decisions or sign legal documents. Do not take care of children on your own. Eating and drinking Follow the diet that is recommended by your health care provider. Drink enough  fluid to keep your urine pale yellow. If you vomit: Drink water, juice, or soup when you can drink without vomiting. Make sure you have little or no nausea before eating solid foods. General instructions Have a responsible adult stay with you for the time you are told. It is important to have someone help care for you until you are awake and alert. Take over-the-counter and prescription medicines only as told by your health care provider. If you have sleep apnea, surgery and certain medicines can increase your risk for breathing problems. Follow instructions from your health care provider about wearing your sleep device: Anytime you are sleeping, including during daytime naps. While taking prescription pain medicines, sleeping medicines, or medicines that make you drowsy. Avoid smoking. Keep all follow-up visits as told by your health care provider. This is important. Contact a health care provider if: You keep feeling nauseous or you keep vomiting. You feel light-headed.  You are still sleepy or having trouble with balance after 24 hours. You develop a rash. You have a fever. You have redness or swelling around the IV site. Get help right away if: You have trouble breathing. You have new-onset confusion at home. Summary For several hours after your procedure, you may feel tired. You may also be forgetful and have poor judgment. Have a responsible adult stay with you for the time you are told. It is important to have someone help care for you until you are awake and alert. Rest as told. Do not drive or operate machinery. Do not drink alcohol or take sleeping pills. Get help right away if you have trouble breathing, or if you suddenly become confused. This information is not intended to replace advice given to you by your health care provider. Make sure you discuss any questions you have with your health care provider. Document Revised: 11/04/2019 Document Reviewed: 01/21/2019 Elsevier  Patient Education  2022 [employer].

## 2020-12-27 NOTE — Pre-Procedure Instructions (Signed)
In for Preop testing. Patient states that he cannot afford the insulin he is on due to "fixed income". He does not take his insulin like he should all the time due to this. I emailed Barnie Alderman to reacj=h out to patient and try and help with this matter.

## 2020-12-28 ENCOUNTER — Encounter (HOSPITAL_COMMUNITY): Payer: Self-pay | Admitting: Internal Medicine

## 2020-12-28 ENCOUNTER — Ambulatory Visit (HOSPITAL_COMMUNITY)
Admission: RE | Admit: 2020-12-28 | Discharge: 2020-12-28 | Disposition: A | Discharge status: Home / Self Care | Payer: Medicare HMO | Attending: Internal Medicine | Admitting: Internal Medicine

## 2020-12-28 ENCOUNTER — Encounter (HOSPITAL_COMMUNITY)
Admission: RE | Disposition: A | Discharge status: Home / Self Care | Payer: Self-pay | Source: Home / Self Care | Attending: Internal Medicine

## 2020-12-28 ENCOUNTER — Ambulatory Visit (HOSPITAL_COMMUNITY): Payer: Medicare HMO | Admitting: Anesthesiology

## 2020-12-28 DIAGNOSIS — F17210 Nicotine dependence, cigarettes, uncomplicated: Secondary | ICD-10-CM | POA: Diagnosis not present

## 2020-12-28 DIAGNOSIS — K319 Disease of stomach and duodenum, unspecified: Secondary | ICD-10-CM | POA: Insufficient documentation

## 2020-12-28 DIAGNOSIS — Z79899 Other long term (current) drug therapy: Secondary | ICD-10-CM | POA: Insufficient documentation

## 2020-12-28 DIAGNOSIS — K2100 Gastro-esophageal reflux disease with esophagitis, without bleeding: Secondary | ICD-10-CM | POA: Insufficient documentation

## 2020-12-28 DIAGNOSIS — K449 Diaphragmatic hernia without obstruction or gangrene: Secondary | ICD-10-CM | POA: Diagnosis not present

## 2020-12-28 DIAGNOSIS — R1310 Dysphagia, unspecified: Secondary | ICD-10-CM

## 2020-12-28 DIAGNOSIS — Z7984 Long term (current) use of oral hypoglycemic drugs: Secondary | ICD-10-CM | POA: Diagnosis not present

## 2020-12-28 DIAGNOSIS — K209 Esophagitis, unspecified without bleeding: Secondary | ICD-10-CM | POA: Diagnosis not present

## 2020-12-28 DIAGNOSIS — Z794 Long term (current) use of insulin: Secondary | ICD-10-CM | POA: Diagnosis not present

## 2020-12-28 DIAGNOSIS — Z8673 Personal history of transient ischemic attack (TIA), and cerebral infarction without residual deficits: Secondary | ICD-10-CM | POA: Insufficient documentation

## 2020-12-28 DIAGNOSIS — Z96652 Presence of left artificial knee joint: Secondary | ICD-10-CM | POA: Diagnosis not present

## 2020-12-28 DIAGNOSIS — Z7902 Long term (current) use of antithrombotics/antiplatelets: Secondary | ICD-10-CM | POA: Diagnosis not present

## 2020-12-28 HISTORY — PX: MALONEY DILATION: SHX5535

## 2020-12-28 HISTORY — PX: BIOPSY: SHX5522

## 2020-12-28 HISTORY — PX: ESOPHAGOGASTRODUODENOSCOPY (EGD) WITH PROPOFOL: SHX5813

## 2020-12-28 LAB — GLUCOSE, CAPILLARY
Glucose-Capillary: 253 mg/dL — ABNORMAL HIGH (ref 70–99)
Glucose-Capillary: 200 mg/dL — ABNORMAL HIGH (ref 70–99)

## 2020-12-28 SURGERY — ESOPHAGOGASTRODUODENOSCOPY (EGD) WITH PROPOFOL
Anesthesia: General

## 2020-12-28 MED ORDER — LACTATED RINGERS IV SOLN: INTRAVENOUS | Status: DC | PRN

## 2020-12-28 MED ORDER — LIDOCAINE HCL (CARDIAC) PF 100 MG/5ML IV SOSY
PREFILLED_SYRINGE | INTRAVENOUS | Status: DC | PRN
  Administered 2020-12-28: 100 mg via INTRAVENOUS

## 2020-12-28 MED ORDER — PROPOFOL 10 MG/ML IV BOLUS
INTRAVENOUS | Status: DC | PRN
  Administered 2020-12-28: 100 ug/kg/min via INTRAVENOUS
  Administered 2020-12-28: 100 mg via INTRAVENOUS

## 2020-12-28 NOTE — Op Note (Signed)
[facility] Patient Name: Jimmy Duran Procedure Date: 12/28/2020 7:46 AM MRN: [id number] Date of Birth: 07-02-1955 Attending MD: Norvel Richards , MD CSN: [id number] Age: 65 Admit Type: Outpatient Procedure:                Upper GI endoscopy Indications:              Dysphagia Providers:                Norvel Richards, MD, Janeece Riggers, RN, Nelma Rothman, Technician Referring MD:              Medicines:                Propofol per Anesthesia Complications:            No immediate complications. Estimated Blood Loss:     Estimated blood loss was minimal. Procedure:                Pre-Anesthesia Assessment:                           - Prior to the procedure, a History and Physical                            was performed, and patient medications and                            allergies were reviewed. The patient's tolerance of                            previous anesthesia was also reviewed. The risks                            and benefits of the procedure and the sedation                            options and risks were discussed with the patient.                            All questions were answered, and informed consent                            was obtained. Prior Anticoagulants: The patient                            last took Plavix (clopidogrel) 1 day prior to the                            procedure. ASA Grade Assessment: III - A patient                            with severe systemic disease. After reviewing the  risks and benefits, the patient was deemed in                            satisfactory condition to undergo the procedure.                           After obtaining informed consent, the endoscope was                            passed under direct vision. Throughout the                            procedure, the patient's blood pressure, pulse, and                            oxygen saturations were  monitored continuously. The                            [id number]-H190 ([id number]) scope was introduced through the                            mouth, and advanced to the second part of duodenum.                            The upper GI endoscopy was accomplished without                            difficulty. The patient tolerated the procedure                            well. Scope In: 8:04:59 AM Scope Out: 8:14:44 AM Total Procedure Duration: 0 hours 9 minutes 45 seconds  Findings:      Esophagitis with no bleeding was found. Geographic erosions ulcerations       extending 6 to 7 cm into the tubular esophagus from the GE junction. No       overlying exudate. No tumor seen. Aperture of the esophageal lumen       slightly smaller distally than proximally but no discrete stricture       seen. Tubular esophagus remained patent throughout its course.      A small hiatal hernia was present. Stomach empty. Linear stripped       erosions in the antrum. No ulcer or infiltrating process. Patent       pylorus. Normal first and second portion of the duodenum. The scope was       withdrawn. Dilation was performed with a Maloney dilator with mild       resistance at 56 Fr. The dilation site was examined following endoscope       reinsertion and showed mild mucosal disruption. Estimated blood loss was       minimal.      Finally, the abnormal tubular esophagus And gastric antrum were biopsied       for histologic study. Impression:               - Esophagitis with no bleeding. Dilated And  biopsied..                           - Small hiatal hernia. Antral erosions?"status post                            biopsy                           - Moderate Sedation:      Moderate (conscious) sedation was personally administered by an       anesthesia professional. The following parameters were monitored: oxygen       saturation, heart rate, blood pressure, respiratory rate, EKG, adequacy        of pulmonary ventilation, and response to care. Recommendation:           - Patient has a contact number available for                            emergencies. The signs and symptoms of potential                            delayed complications were discussed with the                            patient. Return to normal activities tomorrow.                            Written discharge instructions were provided to the                            patient.                           - Advance diet as tolerated.                           - Continue present medications. follow-up on                            pathology.                           - Return to my office in 3 months. Procedure Code(s):        --- Professional ---                           [id number], Esophagogastroduodenoscopy, flexible,                            transoral; diagnostic, including collection of                            specimen(s) by brushing or washing, when performed                            (separate procedure)  43450, Dilation of esophagus, by unguided sound or                            bougie, single or multiple passes Diagnosis Code(s):        --- Professional ---                           K20.90, Esophagitis, unspecified without bleeding                           K44.9, Diaphragmatic hernia without obstruction or                            gangrene                           R13.10, Dysphagia, unspecified CPT copyright 2019 American Medical Association. All rights reserved. The codes documented in this report are preliminary and upon coder review may  be revised to meet current compliance requirements. [name]. , MD Norvel Richards, MD 12/28/2020 8:59:44 AM This report has been signed electronically. Number of Addenda: 0

## 2020-12-28 NOTE — Discharge Instructions (Addendum)
EGD Discharge instructions Please read the instructions outlined below and refer to this sheet in the next few weeks. These discharge instructions provide you with general information on caring for yourself after you leave the hospital. Your doctor may also give you specific instructions. While your treatment has been planned according to the most current medical practices available, unavoidable complications occasionally occur. If you have any problems or questions after discharge, please call your doctor. ACTIVITY You may resume your regular activity but move at a slower pace for the next 24 hours.  Take frequent rest periods for the next 24 hours.  Walking will help expel (get rid of) the air and reduce the bloated feeling in your abdomen.  No driving for 24 hours (because of the anesthesia (medicine) used during the test).  You may shower.  Do not sign any important legal documents or operate any machinery for 24 hours (because of the anesthesia used during the test).  NUTRITION Drink plenty of fluids.  You may resume your normal diet.  Begin with a light meal and progress to your normal diet.  Avoid alcoholic beverages for 24 hours or as instructed by your caregiver.  MEDICATIONS You may resume your normal medications unless your caregiver tells you otherwise.  WHAT YOU CAN EXPECT TODAY You may experience abdominal discomfort such as a feeling of fullness or "gas" pains.  FOLLOW-UP Your doctor will discuss the results of your test with you.  SEEK IMMEDIATE MEDICAL ATTENTION IF ANY OF THE FOLLOWING OCCUR: Excessive nausea (feeling sick to your stomach) and/or vomiting.  Severe abdominal pain and distention (swelling).  Trouble swallowing.  Temperature over 101 F (37.8 C).  Rectal bleeding or vomiting of blood.      Your esophagus was inflamed.  Your esophagus was stretched today.  Biopsies were taken.  Stomach also mildly inflamed.  Biopsies taken.    Continue Protonix 40 mg twice  daily   office visit with [name] in 3 months  Further recommendations to follow pending review of pathology report   at patient request, I called Estill Bamberg at [phone] and reviewed results.

## 2020-12-28 NOTE — Anesthesia Postprocedure Evaluation (Signed)
Anesthesia Post Note  Patient: Jimmy Duran  Procedure(s) Performed: ESOPHAGOGASTRODUODENOSCOPY (EGD) WITH PROPOFOL [place]  Patient location during evaluation: PACU Anesthesia Type: General Level of consciousness: awake and alert and oriented Pain management: pain level controlled Vital Signs Assessment: post-procedure vital signs reviewed and stable Respiratory status: spontaneous breathing, nonlabored ventilation and respiratory function stable Cardiovascular status: blood pressure returned to baseline and stable Postop Assessment: no apparent nausea or vomiting Anesthetic complications: no   No notable events documented.   Last Vitals:  Vitals:   12/28/20 0819 12/28/20 0826  BP: (!) 162/99 (!) 141/98  Pulse: 94   Resp: 17   Temp: 36.7 C   SpO2: 99%     Last Pain:  Vitals:   12/28/20 0819  TempSrc: Oral  PainSc: 0-No pain                  C 

## 2020-12-28 NOTE — Transfer of Care (Signed)
Immediate Anesthesia Transfer of Care Note  Patient: [place]  Procedure(s) Performed: ESOPHAGOGASTRODUODENOSCOPY (EGD) WITH PROPOFOL [place]  Patient Location: Short Stay  Anesthesia Type:General  Level of Consciousness: awake, alert , oriented, patient cooperative and responds to stimulation  Airway & Oxygen Therapy: Patient Spontanous Breathing and Patient connected to nasal cannula oxygen  Post-op Assessment: Report given to RN, Post -op Vital signs reviewed and stable and Patient moving all extremities X 4  Post vital signs: Reviewed and stable  Last Vitals:  Vitals Value Taken Time  BP    Temp    Pulse    Resp    SpO2      Last Pain:  Vitals:   12/28/20 0703  TempSrc: Oral         Complications: No notable events documented.

## 2020-12-28 NOTE — H&P (Signed)
@LOGO @   Primary Care Physician:  Abran Richard, MD Primary Gastroenterologist:  Dr. Gala Romney  Pre-Procedure History & Physical: HPI:  Jimmy Duran is a 65 y.o. male here for   Further evaluation recurrent esophageal dysphagia.  Longstanding GERD.  History of thickened distal esophagus on CT earlier this year.  Prior EGD 2019 demonstrated reflux esophagitis he was empirically dilated.  Did well until recently.  Has SMA stent.  He has no intestinal anginal symptoms.  Past Medical History:  Diagnosis Date   AAA (abdominal aortic aneurysm)    Arthritis    Carotid artery disease (HCC)    Colon polyp    COPD (chronic obstructive pulmonary disease) (HCC)    Coronary artery disease    Nonobstructive   Essential hypertension    GERD (gastroesophageal reflux disease)    Hemorrhoids    History of TIA (transient ischemic attack)    Low back pain    Lumbar radiculopathy    Mixed hyperlipidemia    Palpitations    Peripheral vascular disease ([place])    Shingles 09/23/2019   Type 2 diabetes mellitus ([place])     Past Surgical History:  Procedure Laterality Date   APPENDECTOMY  1970   BIOPSY  12/08/2017   Procedure: BIOPSY;  Surgeon: [name], MD;  [facility];  Service: Endoscopy;;  esophagus    BREAST SURGERY Right    Cyst resection on the right   CARDIAC CATHETERIZATION  1990's   in [place]. no stent placement   CATARACT EXTRACTION W/PHACO Right 06/18/2016   Procedure: CATARACT EXTRACTION PHACO AND INTRAOCULAR LENS PLACEMENT ([place]);  Surgeon: [name], MD;  Location: [facility] OR[facility];  Service: Ophthalmology;  Laterality: Right;  [name] 35.7 AP% 16.6 CDE 5.94 Fluid pack lot # [id number]   CATARACT EXTRACTION W/PHACO Left 07/16/2016   Procedure: CATARACT EXTRACTION PHACO AND INTRAOCULAR LENS PLACEMENT (IOC);  Surgeon: [name], MD;  Location: [facility] OR[facility];  Service: Ophthalmology;  Laterality: Left;  [name] 51.8 AP% 12.7 CDE 6.58 Fluid Pack Lot # [id number]    CHOLECYSTECTOMY  1993   COLONOSCOPY  02/12/2007   [id number] friable anal canal hemorrhoids, otherwise normal rectum and colon   COLONOSCOPY N/A 07/16/2012   RMR: Colonic diverticulosis. 4 mm tubular adenoma   COLONOSCOPY WITH PROPOFOL N/A 12/08/2017   Dr. Gala Romney: sigmoid and descending colon diverticulosis, transverse colon polyp (TUBULAR ADENOMA)   ENDARTERECTOMY Right 10/08/2019   Procedure: RIGHT CAROTID ENDARTERECTOMY;  Surgeon: Rosetta Posner, MD;  Location: [facility];  Service: Vascular;  Laterality: Right;   ESOPHAGOGASTRODUODENOSCOPY (EGD) WITH ESOPHAGEAL DILATION N/A 07/16/2012   RMR: +Candida esophagitis, Erosive reflux esophagiits. Schatizi's ring status post dilation. Hiatal hernia. Antral erosions status post biopsy.    ESOPHAGOGASTRODUODENOSCOPY (EGD) WITH PROPOFOL N/A 12/08/2017   Dr. Gala Romney: esophagitis, query EOE but negative for increased eosinophils on path, small hiatal hernia, normal stomach, normal duodenum, PAS stain with rare yeast forms on stain. Empiric dilation   EYE SURGERY Bilateral 2018   cataract   HERNIA REPAIR     JOINT REPLACEMENT     KNEE SURGERY     Left knee arthroscopy torn medial meniscus grade 4 chondral changes medial femoral condyle tibial plateau   MALONEY DILATION N/A 12/08/2017   Procedure: MALONEY DILATION;  Surgeon: [name], MD;  [facility];  Service: Endoscopy;  Laterality: N/A;   PATCH ANGIOPLASTY Right 10/08/2019   Procedure: PATCH ANGIOPLASTY of the right common carotid artery using hemashield plaltinum finesse patch;  Surgeon:  Rosetta Posner, MD;  [facility];  Service: Vascular;  Laterality: Right;   PERIPHERAL VASCULAR INTERVENTION  07/17/2020   Procedure: PERIPHERAL VASCULAR INTERVENTION;  Surgeon: Waynetta Sandy, MD;  Location: [place] [facility];  Service: Cardiovascular;;   POLYPECTOMY  12/08/2017   Procedure: POLYPECTOMY;  Surgeon: [name], MD;  [facility];  Service: Endoscopy;;   colon   TOTAL KNEE ARTHROPLASTY Left 04/27/2012   Procedure: TOTAL KNEE ARTHROPLASTY;  Surgeon: Carole Civil, MD;  Location: [facility];  Service: Orthopedics;  Laterality: Left;   UMBILICAL HERNIA REPAIR  1993   VISCERAL ANGIOGRAPHY N/A 07/17/2020   Procedure: mesenteric ANGIOGRAPHY;  Surgeon: Waynetta Sandy, MD;  Location: [place] [facility];  Service: Cardiovascular;  Laterality: N/A;    Prior to Admission medications   Medication Sig Start Date End Date Taking? Authorizing Provider  Aspirin-Caffeine (BC FAST PAIN RELIEF PO) Take 1 packet by mouth daily as needed (pain).   Yes [provider]  buPROPion (WELLBUTRIN SR) 150 MG 12 hr tablet Take 150 mg by mouth 2 (two) times daily.   Yes [provider]  clopidogrel (PLAVIX) 75 MG tablet Take 1 tablet (75 mg total) by mouth daily. 07/17/20 07/17/21 Yes Waynetta Sandy, MD  diphenhydrAMINE (BENADRYL) 25 MG tablet Take 25 mg by mouth daily as needed for allergies.    Yes [provider]  insulin aspart (NOVOLOG) 100 UNIT/ML injection Inject 5 Units into the skin 3 (three) times daily before meals.   Yes [provider]  lisinopril-hydrochlorothiazide (ZESTORETIC) 20-12.5 MG tablet Take 1 tablet by mouth daily. 01/26/20  Yes Satira Sark, MD  metFORMIN (GLUCOPHAGE) 1000 MG tablet Take 1,000 mg by mouth 2 (two) times daily with a meal.   Yes [provider]  metoprolol tartrate (LOPRESSOR) 25 MG tablet Take 1 tablet (25 mg total) by mouth 2 (two) times daily. 12/01/18 12/21/20 Yes Satira Sark, MD  pantoprazole (PROTONIX) 40 MG tablet Take 1 tablet (40 mg total) by mouth 2 (two) times daily before a meal. 11/21/20  Yes Annitta Needs, NP  rosuvastatin (CRESTOR) 40 MG tablet Take 1 tablet by mouth once daily 08/28/20  Yes Verta Ellen., NP    Allergies as of 12/14/2020   (No Known Allergies)    Family History  Problem Relation Age of Onset   Colon polyps Mother     Coronary artery disease Father        CABG in his 73's   Colon cancer Other        maternal great aunt   Colon cancer Other        paternal great aunt    Social History   Socioeconomic History   Marital status: Married    Spouse name: Not on file   Number of children: Not on file   Years of education: Not on file   Highest education level: Not on file  Occupational History   Occupation: Programme researcher, 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   Vaping Use   Vaping Use: Former  Substance and Sexual Activity   Alcohol use: Not Currently    Alcohol/week: 0.0 standard drinks  Comment: socially "beer and tomato juice"    Drug use: No   Sexual activity: Yes    Birth control/protection: None  Other Topics Concern   Not on file  Social History Narrative   Not on file   Social Determinants of Health   Financial Resource Strain: Not on file  Food Insecurity: Not on file  Transportation Needs: Not on file  Physical Activity: Not on file  Stress: Not on file  Social Connections: Not on file  Intimate Partner Violence: Not on file    Review of Systems: See HPI, otherwise negative ROS  Physical Exam: BP 133/86   Pulse 77   Temp 98.1 F (36.7 C) (Oral)   Resp 19   SpO2 98%  General:   Alert,  Well-developed, well-nourished, pleasant and cooperative in NAD Neck:  Supple; no masses or thyromegaly. No significant cervical adenopathy. Lungs:  Clear throughout to auscultation.   No wheezes, crackles, or rhonchi. No acute distress. Heart:  Regular rate and rhythm; no murmurs, clicks, rubs,  or gallops. Abdomen: Non-distended, normal bowel sounds.  Soft and nontender without appreciable mass or hepatosplenomegaly.  Pulses:  Normal pulses noted. Extremities:  Without clubbing or  edema.  Impression/Plan:    65 year old gentleman here for further evaluation of recurrent soft dysphagia.  Distal esophageal wall thickening on CT suggestive previously.  He responded nicely to empiric dilation in the past.  Have offered him an EGD with esophageal dilation as feasible/appropriate today per plan. The risks, benefits, limitations, alternatives and imponderables have been reviewed with the patient. Potential for esophageal dilation, biopsy, etc. have also been reviewed.  Questions have been answered. All parties agreeable.      Notice: This dictation was prepared with Dragon dictation along with smaller phrase technology. Any transcriptional errors that result from this process are unintentional and may not be corrected upon review.

## 2020-12-28 NOTE — Anesthesia Preprocedure Evaluation (Signed)
Anesthesia Evaluation  Patient identified by MRN, date of birth, ID band Patient awake    Reviewed: Allergy & Precautions, NPO status , Patient's Chart, lab work & pertinent test results, reviewed documented beta blocker date and time   Airway Mallampati: II  TM Distance: >3 FB Neck ROM: Full    Dental  (+) Edentulous Upper, Edentulous Lower   Pulmonary COPD,  COPD inhaler, Current Smoker,    Pulmonary exam normal breath sounds clear to auscultation       Cardiovascular hypertension, Pt. on medications and Pt. on home beta blockers + CAD and + Peripheral Vascular Disease (right carotid endarterectomy, as per patient left is 70% stenosed)  Normal cardiovascular exam Rhythm:Regular Rate:Normal  Results reviewed.  LVEF remains normal range at 60 to 65%.  Overall mild valvular calcification is noted but no major valvular abnormalities that would be expected to cause symptoms at this point.  Continue with current follow-up plan   Neuro/Psych TIA Neuromuscular disease negative psych ROS   GI/Hepatic Neg liver ROS, GERD  Medicated and Controlled,  Endo/Other  negative endocrine ROSdiabetes, Well Controlled, Type 2, Oral Hypoglycemic Agents  Renal/GU negative Renal ROS  negative genitourinary   Musculoskeletal  (+) Arthritis , Osteoarthritis,    Abdominal   Peds negative pediatric ROS (+)  Hematology negative hematology ROS (+)   Anesthesia Other Findings IMPRESSION: 1. Mild wall thickening of the distal esophagus. Correlation for symptoms of esophagitis is recommended. 2. Rectosigmoid diverticulosis without acute inflammation. 3. Nonobstructive left nephrolithiasis. 4. Significant atherosclerotic disease is noted including a high-grade stenosis of the SMA which has progressed since the prior study in 2018.  Aortic Atherosclerosis (ICD10-I70.0).   Electronically Signed   By: Constance Holster M.D.   On: 06/23/2020  15:03  Reproductive/Obstetrics negative OB ROS                             Anesthesia Physical Anesthesia Plan  ASA: 3  Anesthesia Plan: General   Post-op Pain Management:    Induction: Intravenous  PONV Risk Score and Plan: TIVA  Airway Management Planned: Nasal Cannula and Natural Airway  Additional Equipment:   Intra-op Plan:   Post-operative Plan:   Informed Consent: I have reviewed the patients History and Physical, chart, labs and discussed the procedure including the risks, benefits and alternatives for the proposed anesthesia with the patient or authorized representative who has indicated his/her understanding and acceptance.     Dental advisory given  Plan Discussed with: CRNA and Surgeon  Anesthesia Plan Comments:         Anesthesia Quick Evaluation

## 2020-12-31 ENCOUNTER — Encounter: Payer: Self-pay | Admitting: Internal Medicine

## 2021-01-01 ENCOUNTER — Encounter (HOSPITAL_COMMUNITY): Payer: Self-pay | Admitting: Internal Medicine

## 2021-01-01 LAB — SURGICAL PATHOLOGY

## 2021-03-13 ENCOUNTER — Other Ambulatory Visit: Payer: Self-pay | Admitting: Cardiology

## 2021-04-10 ENCOUNTER — Other Ambulatory Visit: Payer: Self-pay

## 2021-04-10 ENCOUNTER — Encounter (HOSPITAL_COMMUNITY): Payer: Self-pay

## 2021-04-10 ENCOUNTER — Emergency Department (HOSPITAL_COMMUNITY)
Admission: EM | Admit: 2021-04-10 | Discharge: 2021-04-11 | Disposition: A | Discharge status: Home / Self Care | Payer: Medicare HMO | Attending: Emergency Medicine | Admitting: Emergency Medicine

## 2021-04-10 DIAGNOSIS — D649 Anemia, unspecified: Secondary | ICD-10-CM | POA: Diagnosis not present

## 2021-04-10 DIAGNOSIS — Z7984 Long term (current) use of oral hypoglycemic drugs: Secondary | ICD-10-CM | POA: Diagnosis not present

## 2021-04-10 DIAGNOSIS — Z7902 Long term (current) use of antithrombotics/antiplatelets: Secondary | ICD-10-CM | POA: Insufficient documentation

## 2021-04-10 DIAGNOSIS — R739 Hyperglycemia, unspecified: Secondary | ICD-10-CM

## 2021-04-10 DIAGNOSIS — Z794 Long term (current) use of insulin: Secondary | ICD-10-CM | POA: Insufficient documentation

## 2021-04-10 DIAGNOSIS — E1165 Type 2 diabetes mellitus with hyperglycemia: Secondary | ICD-10-CM | POA: Insufficient documentation

## 2021-04-10 LAB — URINALYSIS, ROUTINE W REFLEX MICROSCOPIC
Bacteria, UA: NONE SEEN
Bilirubin Urine: NEGATIVE
Glucose, UA: >=500 mg/dL — AB
Ketones, ur: NEGATIVE mg/dL
Leukocytes,Ua: NEGATIVE
Nitrite: NEGATIVE
Protein, ur: NEGATIVE mg/dL
Specific Gravity, Urine: 1.003 — ABNORMAL LOW (ref 1.005–1.030)
pH: 6 (ref 5.0–8.0)

## 2021-04-10 LAB — BASIC METABOLIC PANEL
Anion gap: 10 (ref 5–15)
BUN: 16 mg/dL (ref 8–23)
CO2: 25 mmol/L (ref 22–32)
Calcium: 9.3 mg/dL (ref 8.9–10.3)
Chloride: 98 mmol/L (ref 98–111)
Creatinine, Ser: 1.25 mg/dL — ABNORMAL HIGH (ref 0.61–1.24)
GFR, Estimated: >60 mL/min (ref >60)
Glucose, Bld: 350 mg/dL — ABNORMAL HIGH (ref 70–99)
Potassium: 3.9 mmol/L (ref 3.5–5.1)
Sodium: 133 mmol/L — ABNORMAL LOW (ref 135–145)

## 2021-04-10 LAB — CBC
HCT: 37.1 % — ABNORMAL LOW (ref 39.0–52.0)
Hemoglobin: 12.8 g/dL — ABNORMAL LOW (ref 13.0–17.0)
MCH: 32.9 pg (ref 26.0–34.0)
MCHC: 34.5 g/dL (ref 30.0–36.0)
MCV: 95.4 fL (ref 80.0–100.0)
Platelets: 270 10*3/uL (ref 150–400)
RBC: 3.89 MIL/uL — ABNORMAL LOW (ref 4.22–5.81)
RDW: 13.2 % (ref 11.5–15.5)
WBC: 6.8 10*3/uL (ref 4.0–10.5)
nRBC: 0.3 % — ABNORMAL HIGH (ref 0.0–0.2)

## 2021-04-10 LAB — CBG MONITORING, ED: Glucose-Capillary: 377 mg/dL — ABNORMAL HIGH (ref 70–99)

## 2021-04-10 NOTE — ED Provider Notes (Signed)
[facility] EMERGENCY DEPARTMENT  Provider Note  CSN: [id number] Arrival date & time: 04/10/21 2031  History Chief Complaint  Patient presents with   Hyperglycemia    Jimmy Duran is a 66 y.o. male with history of DM on insulin and CGM that was reading high today. He confirmed with a fingerstick glucometer too. He has had some difficulty urinating recently that his doctor thought might have been related to some OTC cold medications he was taking. He gave himself some extra insulin at home but four hours later his glucometer was still reading high and so he came to the ED. Glucose not that elevated on arrival here and has continued to improve while waiting to be seen. He denies any fever, vomiting, diarrhea.    Home Medications Prior to Admission medications   Medication Sig Start Date End Date Taking? Authorizing Provider  Aspirin-Caffeine (BC FAST PAIN RELIEF PO) Take 1 packet by mouth daily as needed (pain).    [provider]  buPROPion (WELLBUTRIN SR) 150 MG 12 hr tablet Take 150 mg by mouth 2 (two) times daily.    [provider]  clopidogrel (PLAVIX) 75 MG tablet Take 1 tablet (75 mg total) by mouth daily. 07/17/20 07/17/21  Waynetta Sandy, MD  diphenhydrAMINE (BENADRYL) 25 MG tablet Take 25 mg by mouth daily as needed for allergies.     [provider]  insulin aspart (NOVOLOG) 100 UNIT/ML injection Inject 5 Units into the skin 3 (three) times daily before meals.    [provider]  lisinopril-hydrochlorothiazide (ZESTORETIC) 20-12.5 MG tablet Take 1 tablet by mouth once daily 03/13/21   Satira Sark, MD  metFORMIN (GLUCOPHAGE) 1000 MG tablet Take 1,000 mg by mouth 2 (two) times daily with a meal.    [provider]  metoprolol tartrate (LOPRESSOR) 25 MG tablet Take 1 tablet by mouth twice daily 03/13/21   Satira Sark, MD  pantoprazole (PROTONIX) 40 MG tablet Take 1 tablet (40 mg total) by mouth 2 (two) times daily  before a meal. 11/21/20   Annitta Needs, NP  rosuvastatin (CRESTOR) 40 MG tablet Take 1 tablet by mouth once daily 08/28/20   Verta Ellen., NP     Allergies    Patient has no known allergies.   Review of Systems   Review of Systems Please see HPI for pertinent positives and negatives  Physical Exam BP (!) 140/91    Pulse 80    Temp 98.7 F (37.1 C) (Oral)    Resp 17    Ht 5\' 7"  (1.702 m)    Wt 74.8 kg    SpO2 98%    BMI 25.84 kg/m   Physical Exam Vitals and nursing note reviewed.  Constitutional:      Appearance: Normal appearance.  HENT:     Head: Normocephalic and atraumatic.     Nose: Nose normal.     Mouth/Throat:     Mouth: Mucous membranes are dry.  Eyes:     Extraocular Movements: Extraocular movements intact.     Conjunctiva/sclera: Conjunctivae normal.  Cardiovascular:     Rate and Rhythm: Normal rate.  Pulmonary:     Effort: Pulmonary effort is normal.     Breath sounds: Normal breath sounds.  Abdominal:     General: Abdomen is flat.     Palpations: Abdomen is soft.     Tenderness: There is no abdominal tenderness.  Musculoskeletal:        General: No  swelling. Normal range of motion.     Cervical back: Neck supple.  Skin:    General: Skin is warm and dry.  Neurological:     General: No focal deficit present.     Mental Status: He is alert.  Psychiatric:        Mood and Affect: Mood normal.    ED Results / Procedures / Treatments   EKG None  Procedures Procedures  Medications Ordered in the ED Medications - No data to display  Initial Impression and Plan  Patient with hyperglycemia in setting of possible urine infection/retention. He has been able to urinate today, urine symptoms have been improving since stopping the OTC cold medicines a few days ago. Labs done in triage showed mild hyperglycemia without DKA, mild anemia. Awaiting a UA. Patient's monitor shows glucose is 250.   ED Course   Clinical Course as of 04/10/21 2248  Tue Apr 10, 2021  2225 UA shows glucosuria but no signs of infection. Patient is comfortable managing his symptoms at home. Currently does not need inpatient treatment. Will plan discharge, continue home insulin regimen and diabetic diet. PCP follow up. RTED for any other concerns.  [CS]    Clinical Course User Index [CS] Truddie Hidden, MD     MDM Rules/Calculators/A&P Medical Decision Making Problems Addressed: Hyperglycemia: acute illness or injury that poses a threat to life or bodily functions  Amount and/or Complexity of Data Reviewed Labs: ordered. Decision-making details documented in ED Course.  Risk Prescription drug management. Decision regarding hospitalization.    Final Clinical Impression(s) / ED Diagnoses Final diagnoses:  Hyperglycemia    Rx / DC Orders ED Discharge Orders     None        Truddie Hidden, MD 04/10/21 2248

## 2021-04-10 NOTE — ED Triage Notes (Signed)
Pt states he is a diabetic and has two different CBG monitors, both of which were reading High for over four hours. Pt states he took his 8 units after each meal and his 30 units long acting today as well. Denies any symptoms associated with blood sugar being high.   CBG in triage - 377

## 2021-04-10 NOTE — ED Notes (Signed)
Checked pts blood sugar in triage, it is 377. Nurse notified.

## 2021-04-18 NOTE — Progress Notes (Signed)
Cardiology Office Note  Date: 04/19/2021   ID: Jimmy Duran, DOB [birth year], MRN [id number]  PCP:  Jimmy Richard, MD  Cardiologist:  Jimmy Lesches, MD Electrophysiologist:  None   Chief Complaint  Patient presents with   Cardiac follow-up    History of Present Illness: Jimmy Duran is a 66 y.o. male last seen in April 2022 by Mr. Jimmy Sake NP.  He is here today with his wife for a follow-up visit.  He does not report any obvious angina symptoms with exertion, NYHA class II dyspnea.  No palpitations or syncope.  He has been staying busy with some house remodeling and other projects.  Follows with Dr. 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  Recent carotid Dopplers are noted below.  He has a prior history of right CEA in 2021.  We went over his medications which are noted below.  He reports compliance with therapy, checks blood pressure regularly at home.  He is also following with his PCP and has had good lipid control, his last LDL was 51 on Crestor.  I reviewed his interval ECG from May 2022.  Past Medical History:  Diagnosis Date   AAA (abdominal aortic aneurysm)    Arthritis    Carotid artery disease (HCC)    Colon polyp    COPD (chronic obstructive pulmonary disease) (HCC)    Coronary artery disease    Nonobstructive   Essential hypertension    GERD (gastroesophageal reflux disease)    Hemorrhoids    History of TIA (transient ischemic attack)    Low back pain    Lumbar radiculopathy    Mixed hyperlipidemia    Palpitations    Peripheral vascular disease ([place])    Shingles 09/23/2019   Type 2 diabetes mellitus [facility])     Past Surgical History:  Procedure Laterality Date   APPENDECTOMY  1970   BIOPSY  12/08/2017   Procedure: BIOPSY;  Surgeon: [name], MD;  [facility];  Service: Endoscopy;;  esophagus    BIOPSY  12/28/2020   Procedure: BIOPSY;  Surgeon:  [name], MD;  [facility];  Service: Endoscopy;;   BREAST SURGERY Right    Cyst resection on the right   CARDIAC CATHETERIZATION  1990's   in [place]. no stent placement   CATARACT EXTRACTION W/PHACO Right 06/18/2016   Procedure: CATARACT EXTRACTION PHACO AND INTRAOCULAR LENS PLACEMENT ([place]);  Surgeon: [name], MD;  Location: [facility] ORS;  Service: Ophthalmology;  Laterality: Right;  [name] 35.7 AP% 16.6 CDE 5.94 Fluid pack lot # [id number]   CATARACT EXTRACTION W/PHACO Left 07/16/2016   Procedure: CATARACT EXTRACTION PHACO AND INTRAOCULAR LENS PLACEMENT ([facility]);  Surgeon: [name], MD;  Location: [facility] ORS;  Service: Ophthalmology;  Laterality: Left;  [name] 51.8 AP% 12.7 CDE 6.58 Fluid Pack Lot # [id number]   CHOLECYSTECTOMY  1993   COLONOSCOPY  02/12/2007   QIO:[id number] friable anal canal hemorrhoids, otherwise normal rectum and colon   COLONOSCOPY N/A 07/16/2012   RMR: Colonic diverticulosis. 4 mm tubular adenoma   COLONOSCOPY WITH PROPOFOL N/A 12/08/2017   Dr. Gala Romney: sigmoid and descending colon diverticulosis, transverse colon polyp (TUBULAR ADENOMA)   ENDARTERECTOMY Right 10/08/2019   Procedure: RIGHT CAROTID ENDARTERECTOMY;  Surgeon: Rosetta Posner, MD;  Location: [facility] OR;  Service: Vascular;  Laterality: Right;   ESOPHAGOGASTRODUODENOSCOPY (EGD) WITH ESOPHAGEAL DILATION N/A 07/16/2012   RMR: +Candida esophagitis, Erosive reflux  esophagiits. Schatizi's ring status post dilation. Hiatal hernia. Antral erosions status post biopsy.    ESOPHAGOGASTRODUODENOSCOPY (EGD) WITH PROPOFOL N/A 12/08/2017   Dr. Gala Romney: esophagitis, query EOE but negative for increased eosinophils on path, small hiatal hernia, normal stomach, normal duodenum, PAS stain with rare yeast forms on stain. Empiric dilation   ESOPHAGOGASTRODUODENOSCOPY (EGD) WITH PROPOFOL N/A 12/28/2020   Procedure: ESOPHAGOGASTRODUODENOSCOPY (EGD) WITH PROPOFOL;  Surgeon: [name], MD;  [facility];  Service: Endoscopy;  Laterality: N/A;  8:00am   EYE SURGERY Bilateral 2018   cataract   HERNIA REPAIR     JOINT REPLACEMENT     KNEE SURGERY     Left knee arthroscopy torn medial meniscus grade 4 chondral changes medial femoral condyle tibial plateau   MALONEY DILATION N/A 12/08/2017   Procedure: MALONEY DILATION;  Surgeon: [name], MD;  [facility];  Service: Endoscopy;  Laterality: N/A;   MALONEY DILATION N/A 12/28/2020   Procedure: Venia Minks DILATION;  Surgeon: [name], MD;  [facility];  Service: Endoscopy;  Laterality: N/A;   PATCH ANGIOPLASTY Right 10/08/2019   Procedure: PATCH ANGIOPLASTY of the right common carotid artery using hemashield plaltinum finesse patch;  Surgeon: Rosetta Posner, MD;  Location: [facility] OR;  Service: Vascular;  Laterality: Right;   PERIPHERAL VASCULAR INTERVENTION  07/17/2020   Procedure: PERIPHERAL VASCULAR INTERVENTION;  Surgeon: Waynetta Sandy, MD;  Location: [place] [facility];  Service: Cardiovascular;;   POLYPECTOMY  12/08/2017   Procedure: POLYPECTOMY;  Surgeon: [name], MD;  [facility];  Service: Endoscopy;;  colon   TOTAL KNEE ARTHROPLASTY Left 04/27/2012   Procedure: TOTAL KNEE ARTHROPLASTY;  Surgeon: Carole Civil, MD;  Location: [facility];  Service: Orthopedics;  Laterality: Left;   UMBILICAL HERNIA REPAIR  1993   VISCERAL ANGIOGRAPHY N/A 07/17/2020   Procedure: mesenteric ANGIOGRAPHY;  Surgeon: Waynetta Sandy, MD;  Location: [place] [facility];  Service: Cardiovascular;  Laterality: N/A;    Current Outpatient Medications  Medication Sig Dispense Refill   Aspirin-Caffeine (BC FAST PAIN RELIEF PO) Take 1 packet by mouth daily as needed (pain).     buPROPion (WELLBUTRIN SR) 150 MG 12 hr tablet Take 150 mg by mouth 2 (two) times daily.     clopidogrel (PLAVIX) 75 MG tablet Take 1 tablet (75 mg total) by mouth daily. 30 tablet 11   diphenhydrAMINE (BENADRYL) 25 MG  tablet Take 25 mg by mouth daily as needed for allergies.      insulin aspart (NOVOLOG) 100 UNIT/ML injection Inject 5 Units into the skin 3 (three) times daily before meals.     insulin NPH Human (NOVOLIN N) 100 UNIT/ML injection Inject 35 Units into the skin in the morning and at bedtime.     lisinopril-hydrochlorothiazide (ZESTORETIC) 20-12.5 MG tablet Take 1 tablet by mouth once daily 90 tablet 0   metFORMIN (GLUCOPHAGE) 1000 MG tablet Take 1,000 mg by mouth 2 (two) times daily with a meal.     metoprolol tartrate (LOPRESSOR) 25 MG tablet Take 1 tablet by mouth twice daily 180 tablet 0   pantoprazole (PROTONIX) 40 MG tablet Take 1 tablet (40 mg total) by mouth 2 (two) times daily before a meal. 60 tablet 3   rosuvastatin (CRESTOR) 40 MG tablet Take 1 tablet by mouth once daily 90 tablet 3   No current facility-administered medications for this visit.   Allergies:  Patient has no known allergies.   ROS:  No orthopnea or PND.  Physical Exam: VS:  BP 138/84    Pulse 78    Ht 5\' 7"  (1.702 m)    Wt 151 lb 9.6 oz (68.8 kg)    SpO2 97%    BMI 23.74 kg/m , BMI Body mass index is 23.74 kg/m.  Wt Readings from Last 3 Encounters:  04/19/21 151 lb 9.6 oz (68.8 kg)  04/10/21 165 lb (74.8 kg)  12/27/20 147 lb (66.7 kg)    General: Patient appears comfortable at rest. HEENT: Conjunctiva and lids normal, wearing a mask. Neck: Supple, no elevated JVP or carotid bruits, no thyromegaly. Lungs: Clear to auscultation, nonlabored breathing at rest. Cardiac: Regular rate and rhythm, no S3 or significant systolic murmur, no pericardial rub. Abdomen: Soft, bowel sounds present, no bruits. Extremities: No pitting edema.  ECG:  An ECG dated 07/17/2020 was personally reviewed today and demonstrated:  Sinus rhythm with low voltage in the limb leads.  Recent Labwork: March 2022: Cholesterol 126, triglycerides 375, HDL 36, LDL 51 04/10/2021: BUN 16; Creatinine, Ser 1.25; Hemoglobin 12.8; Platelets 270;  Potassium 3.9; Sodium 133   Other Studies Reviewed Today:  Echocardiogram 12/11/2018:  1. Left ventricular ejection fraction, by visual estimation, is 60 to  65%. The left ventricle has normal function. There is moderately increased  left ventricular hypertrophy.   2. Left ventricular diastolic Doppler parameters are consistent with  impaired relaxation pattern of LV diastolic filling.   3. Global right ventricle has low normal systolic function.The right  ventricular size is normal. No increase in right ventricular wall  thickness.   4. Left atrial size was normal.   5. Right atrial size was normal.   6. Mild mitral annular calcification.   7. Mild to moderate aortic valve annular calcification.   8. The mitral valve is degenerative. No evidence of mitral valve  regurgitation.   9. The tricuspid valve is grossly normal. Tricuspid valve regurgitation  was not visualized by color flow Doppler.  10. The aortic valve is tricuspid Aortic valve regurgitation was not  visualized by color flow Doppler. Mild aortic valve sclerosis without  stenosis.  11. The pulmonic valve was not well visualized. Pulmonic valve  regurgitation is not visualized by color flow Doppler.  12. The inferior vena cava is normal in size with greater than 50%  respiratory variability, suggesting right atrial pressure of 3 mmHg.   Carotid Dopplers 08/21/2020: Summary:  Right Carotid: Velocities in the right ICA are consistent with a 1-39%  stenosis.                 The ECA appears >50% stenosed.   Left Carotid: Velocities in the left ICA are consistent with a 60-79%  stenosis.   Vertebrals:  Bilateral vertebral arteries demonstrate antegrade flow.  Subclavians: Normal flow hemodynamics were seen in bilateral subclavian               arteries.   Assessment and Plan:  1.  History of nonobstructive CAD and normal LVEF in the range of 60 to 65%.  He reports no active angina symptoms and plan is to continue  medical therapy and risk factor reduction.  He is on aspirin, Plavix, Lopressor, Zestoretic, and Crestor.  2.  Mixed hyperlipidemia, LDL 51 on Crestor.  3.  Essential hypertension, no changes made to present regimen including Lopressor and Zestoretic.  He tracks blood pressure at home.  4.  Carotid artery disease, moderate LICA stenosis by last Dopplers.  He remains on  Plavix and Crestor with followed by Dr. Donnetta Duran.  Medication Adjustments/Labs and Tests Ordered: Current medicines are reviewed at length with the patient today.  Concerns regarding medicines are outlined above.   Tests Ordered: No orders of the defined types were placed in this encounter.   Medication Changes: No orders of the defined types were placed in this encounter.   Disposition:  Follow up  1 year.  Signed, Satira Sark, MD, [facility] 04/19/2021 8:59 AM    [facility] [place], [place], [place] [address] Phone: [phone]; Fax: [phone]

## 2021-04-19 ENCOUNTER — Ambulatory Visit (INDEPENDENT_AMBULATORY_CARE_PROVIDER_SITE_OTHER): Payer: Medicare HMO | Admitting: Cardiology

## 2021-04-19 ENCOUNTER — Encounter: Payer: Self-pay | Admitting: Cardiology

## 2021-04-19 VITALS — BP 138/84 | HR 78 | Ht 67.0 in | Wt 151.6 lb

## 2021-04-19 DIAGNOSIS — E782 Mixed hyperlipidemia: Secondary | ICD-10-CM | POA: Diagnosis not present

## 2021-04-19 DIAGNOSIS — I10 Essential (primary) hypertension: Secondary | ICD-10-CM | POA: Diagnosis not present

## 2021-04-19 DIAGNOSIS — I25119 Atherosclerotic heart disease of native coronary artery with unspecified angina pectoris: Secondary | ICD-10-CM | POA: Diagnosis not present

## 2021-04-19 DIAGNOSIS — I6523 Occlusion and stenosis of bilateral carotid arteries: Secondary | ICD-10-CM

## 2021-04-19 NOTE — Patient Instructions (Addendum)
Medication Instructions:  °Your physician recommends that you continue on your current medications as directed. Please refer to the Current Medication list given to you today. ° °Labwork: °none ° °Testing/Procedures: °none ° °Follow-Up: °Your physician recommends that you schedule a follow-up appointment in: 1 year. You will receive a reminder call or letter in the mail in about 10 months reminding you to call and schedule your appointment. If you don't receive this letter, please contact our office. ° °Any Other Special Instructions Will Be Listed Below (If Applicable). ° °If you need a refill on your cardiac medications before your next appointment, please call your pharmacy. °

## 2021-04-24 ENCOUNTER — Ambulatory Visit: Payer: Medicare HMO | Admitting: Gastroenterology

## 2021-05-07 ENCOUNTER — Ambulatory Visit: Payer: Self-pay | Admitting: "Endocrinology

## 2021-05-08 ENCOUNTER — Other Ambulatory Visit: Payer: Self-pay

## 2021-05-08 ENCOUNTER — Encounter: Payer: Self-pay | Admitting: Gastroenterology

## 2021-05-08 ENCOUNTER — Telehealth (INDEPENDENT_AMBULATORY_CARE_PROVIDER_SITE_OTHER): Payer: Medicare HMO | Admitting: Gastroenterology

## 2021-05-08 ENCOUNTER — Telehealth: Payer: Self-pay

## 2021-05-08 ENCOUNTER — Telehealth: Payer: Self-pay | Admitting: *Deleted

## 2021-05-08 VITALS — Ht 67.0 in | Wt 151.0 lb

## 2021-05-08 DIAGNOSIS — R1319 Other dysphagia: Secondary | ICD-10-CM

## 2021-05-08 MED ORDER — FAMOTIDINE 20 MG PO TABS: 20.0000 mg | ORAL_TABLET | Freq: Two times a day (BID) | ORAL | 3 refills | Status: DC

## 2021-05-08 NOTE — Progress Notes (Signed)
Primary Care Physician:  Abran Richard, MD  Primary GI: Dr. Abbey Chatters  Patient Location: Home   Provider Location: [facility] office   Reason for Visit: Follow-up   Persons present on the virtual encounter, with roles: Patient, NP, CMA   Total time (minutes) spent on medical discussion: 15 minutes   Due to COVID-19, visit was conducted using virtual method.  Visit was requested by patient.  Virtual Visit via MyChart Video Note Due to COVID-19, visit is conducted virtually and was requested by patient.   I connected with [name] on 05/08/21 at  4:00 PM EST by video and verified that I am speaking with the correct person using two identifiers.   I discussed the limitations, risks, security and privacy concerns of performing an evaluation and management service by video and the availability of in person appointments. I also discussed with the patient that there may be a patient responsible charge related to this service. The patient expressed understanding and agreed to proceed.  Chief Complaint  Patient presents with   Gastroesophageal Reflux    Medication change      History of Present Illness: Very pleasant 66 year old male presenting with history of chronic GERD, esophagitis, chronic BC powder use, presenting in follow-up for dysphagia and recent EGD. EGD Oct 2022: esophagitis without bleeding, s/p biopsy and dilation. Antral erosions s/p biopsy.   CT April 2022 with contrast noted mild wall thickening of distal esophagus. Significant atherosclerotic disease with high-grade stenosis of SMA that progressed since 2018. Mild narrowing at origin of IMA. He underwent stenting of SMA in May 2022. On Plavix.   Keeps indigestion all the time. Still getting strangled and choked. Taking pantoprazole BID. No pain with swallowing. Notes liquids and solid food issues.   Rare nausea. Not very good appetite. Sugars have been running from 300-600 in past year. Taking BC powders some  for headaches.   Past Medical History:  Diagnosis Date   AAA (abdominal aortic aneurysm)    Arthritis    Carotid artery disease (HCC)    Colon polyp    COPD (chronic obstructive pulmonary disease) (HCC)    Coronary artery disease    Nonobstructive   Essential hypertension    GERD (gastroesophageal reflux disease)    Hemorrhoids    History of TIA (transient ischemic attack)    Low back pain    Lumbar radiculopathy    Mixed hyperlipidemia    Palpitations    Peripheral vascular disease ([place])    Shingles 09/23/2019   Type 2 diabetes mellitus [facility])      Past Surgical History:  Procedure Laterality Date   APPENDECTOMY  1970   BIOPSY  12/08/2017   Procedure: BIOPSY;  Surgeon: [name], MD;  [facility];  Service: Endoscopy;;  esophagus    BIOPSY  12/28/2020   Procedure: BIOPSY;  Surgeon: [name], MD;  [facility];  Service: Endoscopy;;   BREAST SURGERY Right    Cyst resection on the right   CARDIAC CATHETERIZATION  1990's   in [place]. no stent placement   CATARACT EXTRACTION W/PHACO Right 06/18/2016   Procedure: CATARACT EXTRACTION PHACO AND INTRAOCULAR LENS PLACEMENT ([place]);  Surgeon: [name], MD;  Location: [facility];  Service: Ophthalmology;  Laterality: Right;  [name] 35.7 AP% 16.6 CDE 5.94 Fluid pack lot # [id number]   CATARACT EXTRACTION W/PHACO Left 07/16/2016   Procedure: CATARACT EXTRACTION PHACO AND INTRAOCULAR LENS PLACEMENT ([facility]);  Surgeon: George Ina,  Gwyndolyn Saxon, MD;  Location: [facility];  Service: Ophthalmology;  Laterality: Left;  [name] 51.8 AP% 12.7 CDE 6.58 Fluid Pack Lot # [id number]   CHOLECYSTECTOMY  1993   COLONOSCOPY  02/12/2007   JYN:WGNFAOZHY friable anal canal hemorrhoids, otherwise normal rectum and colon   COLONOSCOPY N/A 07/16/2012   RMR: Colonic diverticulosis. 4 mm tubular adenoma   COLONOSCOPY WITH PROPOFOL N/A 12/08/2017   Dr. Gala Romney: sigmoid and descending colon diverticulosis, transverse colon polyp  (TUBULAR ADENOMA)   ENDARTERECTOMY Right 10/08/2019   Procedure: RIGHT CAROTID ENDARTERECTOMY;  Surgeon: Rosetta Posner, MD;  Location: [facility];  Service: Vascular;  Laterality: Right;   ESOPHAGOGASTRODUODENOSCOPY (EGD) WITH ESOPHAGEAL DILATION N/A 07/16/2012   RMR: +Candida esophagitis, Erosive reflux esophagiits. Schatizi's ring status post dilation. Hiatal hernia. Antral erosions status post biopsy.    ESOPHAGOGASTRODUODENOSCOPY (EGD) WITH PROPOFOL N/A 12/08/2017   Dr. Gala Romney: esophagitis, query EOE but negative for increased eosinophils on path, small hiatal hernia, normal stomach, normal duodenum, PAS stain with rare yeast forms on stain. Empiric dilation   ESOPHAGOGASTRODUODENOSCOPY (EGD) WITH PROPOFOL N/A 12/28/2020   Procedure: ESOPHAGOGASTRODUODENOSCOPY (EGD) WITH PROPOFOL;  Surgeon: [name], MD;  [facility];  Service: Endoscopy;  Laterality: N/A;  8:00am   EYE SURGERY Bilateral 2018   cataract   HERNIA REPAIR     JOINT REPLACEMENT     KNEE SURGERY     Left knee arthroscopy torn medial meniscus grade 4 chondral changes medial femoral condyle tibial plateau   MALONEY DILATION N/A 12/08/2017   Procedure: MALONEY DILATION;  Surgeon: [name], MD;  [facility];  Service: Endoscopy;  Laterality: N/A;   MALONEY DILATION N/A 12/28/2020   Procedure: Venia Minks DILATION;  Surgeon: [name], MD;  [facility];  Service: Endoscopy;  Laterality: N/A;   PATCH ANGIOPLASTY Right 10/08/2019   Procedure: PATCH ANGIOPLASTY of the right common carotid artery using hemashield plaltinum finesse patch;  Surgeon: Rosetta Posner, MD;  Location: [place] [facility];  Service: Vascular;  Laterality: Right;   PERIPHERAL VASCULAR INTERVENTION  07/17/2020   Procedure: PERIPHERAL VASCULAR INTERVENTION;  Surgeon: Waynetta Sandy, MD;  Location: [place] [facility];  Service: Cardiovascular;;   POLYPECTOMY  12/08/2017   Procedure: POLYPECTOMY;  Surgeon: [name],  MD;  [facility];  Service: Endoscopy;;  colon   TOTAL KNEE ARTHROPLASTY Left 04/27/2012   Procedure: TOTAL KNEE ARTHROPLASTY;  Surgeon: Carole Civil, MD;  Location: [facility];  Service: Orthopedics;  Laterality: Left;   UMBILICAL HERNIA REPAIR  1993   VISCERAL ANGIOGRAPHY N/A 07/17/2020   Procedure: mesenteric ANGIOGRAPHY;  Surgeon: Waynetta Sandy, MD;  Location: [place] [facility];  Service: Cardiovascular;  Laterality: N/A;     Current Meds  Medication Sig   Aspirin-Caffeine (BC FAST PAIN RELIEF PO) Take 1 packet by mouth daily as needed (pain).   buPROPion (WELLBUTRIN SR) 150 MG 12 hr tablet Take 150 mg by mouth 2 (two) times daily.   clopidogrel (PLAVIX) 75 MG tablet Take 1 tablet (75 mg total) by mouth daily.   diphenhydrAMINE (BENADRYL) 25 MG tablet Take 25 mg by mouth daily as needed for allergies.    insulin aspart (NOVOLOG) 100 UNIT/ML injection Inject 5 Units into the skin 3 (three) times daily before meals.   insulin NPH Human (NOVOLIN N) 100 UNIT/ML injection Inject 35 Units into the skin in the morning and at bedtime.   lisinopril-hydrochlorothiazide (ZESTORETIC) 20-12.5 MG tablet Take 1 tablet  by mouth once daily   metFORMIN (GLUCOPHAGE) 1000 MG tablet Take 1,000 mg by mouth 2 (two) times daily with a meal.   metoprolol tartrate (LOPRESSOR) 25 MG tablet Take 1 tablet by mouth twice daily   pantoprazole (PROTONIX) 40 MG tablet Take 1 tablet (40 mg total) by mouth 2 (two) times daily before a meal.   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   Vaping Use   Vaping Use: Former  Substance and Sexual Activity   Alcohol use: Not Currently    Alcohol/week: 0.0 standard drinks    Comment: socially "beer and tomato juice"    Drug use: No   Sexual activity: Yes    Birth control/protection: None  Other Topics Concern   Not on file  Social History Narrative   Not on file   Social Determinants of Health   Financial Resource Strain: Not on file  Food Insecurity: Not on file  Transportation Needs: Not on file  Physical Activity: Not on file  Stress: Not on file  Social Connections: Not on file       Review of Systems: Gen: Denies fever, chills, anorexia. Denies fatigue, weakness, weight loss.  CV: Denies chest pain, palpitations, syncope, peripheral edema, and claudication. Resp: Denies dyspnea at rest, cough, wheezing, coughing up blood, and pleurisy. GI: see HPI Derm: Denies rash, itching, dry skin Psych: Denies depression, anxiety, memory loss, confusion. No homicidal or suicidal ideation.  Heme: Denies bruising, bleeding, and enlarged lymph nodes.  Observations/Objective: No distress. Unable to perform physical exam due to video encounter.   Assessment and Plan: Very pleasant 66 year old male presenting with history of chronic GERD, esophagitis, chronic BC powder use, presenting in follow-up for dysphagia and recent EGD.    Dysphagia: no improvement s/p dilation. GERD not ideally controlled on pantoprazole BID. He has limited PPI options as he is on Plavix. Will add Pepcid 20 mg once to BID as needed. As no improvement, pursuing BPE. 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 Counseled on  aspirin powder cessation.    Return in 3 months. Colonoscopy 2024.   Follow Up Instructions:      I discussed the assessment and treatment plan with the patient. The patient was provided an opportunity to ask questions and all were answered. The patient agreed with the plan and demonstrated an understanding of the instructions.   The patient was advised to call back or seek an in-person evaluation if the symptoms worsen  or if the condition fails to improve as anticipated.  I provided 15 minutes of face-to-face time during this MyChart Video encounter.  Annitta Needs, PhD, ANP-BC [facility]

## 2021-05-08 NOTE — Telephone Encounter (Signed)
Pt consented to a virtual visit. 

## 2021-05-08 NOTE — Telephone Encounter (Signed)
Jimmy Duran, you are scheduled for a virtual visit with your provider today.  Just as we do with appointments in the office, we must obtain your consent to participate.  Your consent will be active for this visit and any virtual visit you may have with one of our providers in the next 365 days.  If you have a MyChart account, I can also send a copy of this consent to you electronically.  All virtual visits are billed to your insurance company just like a traditional visit in the office.  As this is a virtual visit, video technology does not allow for your provider to perform a traditional examination.  This may limit your provider's ability to fully assess your condition.  If your provider identifies any concerns that need to be evaluated in person or the need to arrange testing such as labs, EKG, etc, we will make arrangements to do so.  Although advances in technology are sophisticated, we cannot ensure that it will always work on either your end or our end.  If the connection with a video visit is poor, we may have to switch to a telephone visit.  With either a video or telephone visit, we are not always able to ensure that we have a secure connection.   I need to obtain your verbal consent now.   Are you willing to proceed with your visit today?  ?

## 2021-05-08 NOTE — Telephone Encounter (Signed)
BPE scheduled for 05/14/21 at 1:00pm, arrive at 12:45pm. NPO 3 hours prior to test. ? ?Tried to call pt, spoke to his wife. Informed her of BPE appt. Appt letter sent via [place]. ?

## 2021-05-08 NOTE — Patient Instructions (Signed)
Continue pantoprazole twice a day, 30 minutes before breakfast and dinner. You can take pepcid up to twice a day as needed for breakthrough reflux. I sent this to your pharmacy. ? ?I have ordered an xray of your esophagus. ? ?Will see you in 3 months! ? ?I enjoyed seeing you again today! As you know, I value our relationship and want to provide genuine, compassionate, and quality care. I welcome your feedback. If you receive a survey regarding your visit,  I greatly appreciate you taking time to fill this out. See you next time! ? ?Annitta Needs, PhD, ANP-BC ?[facility]  ? ?

## 2021-05-09 ENCOUNTER — Encounter: Payer: Self-pay | Admitting: "Endocrinology

## 2021-05-09 ENCOUNTER — Other Ambulatory Visit: Payer: Self-pay

## 2021-05-09 ENCOUNTER — Ambulatory Visit: Payer: Medicare HMO | Admitting: "Endocrinology

## 2021-05-09 VITALS — BP 130/78 | HR 72 | Ht 67.0 in | Wt 150.8 lb

## 2021-05-09 DIAGNOSIS — E1159 Type 2 diabetes mellitus with other circulatory complications: Secondary | ICD-10-CM

## 2021-05-09 DIAGNOSIS — E782 Mixed hyperlipidemia: Secondary | ICD-10-CM

## 2021-05-09 DIAGNOSIS — F17200 Nicotine dependence, unspecified, uncomplicated: Secondary | ICD-10-CM

## 2021-05-09 DIAGNOSIS — I10 Essential (primary) hypertension: Secondary | ICD-10-CM | POA: Diagnosis not present

## 2021-05-09 MED ORDER — NOVOLOG MIX 70/30 FLEXPEN (70-30) 100 UNIT/ML ~~LOC~~ SUPN: 30.0000 [IU] | PEN_INJECTOR | Freq: Two times a day (BID) | SUBCUTANEOUS | 2 refills | Status: DC

## 2021-05-09 NOTE — Patient Instructions (Signed)

## 2021-05-09 NOTE — Progress Notes (Signed)
Endocrinology Consult Note       05/09/2021, 8:48 PM   Subjective:    Patient ID: Jimmy Duran, male    DOB: 02-May-1955.  Jimmy Duran is being seen in consultation for management of currently uncontrolled symptomatic diabetes requested by  Abran Richard, MD.   Past Medical History:  Diagnosis Date   AAA (abdominal aortic aneurysm)    Arthritis    Carotid artery disease ([place])    Colon polyp    COPD (chronic obstructive pulmonary disease) ([place])    Coronary artery disease    Nonobstructive   Essential hypertension    GERD (gastroesophageal reflux disease)    Hemorrhoids    History of TIA (transient ischemic attack)    Low back pain    Lumbar radiculopathy    Mixed hyperlipidemia    Palpitations    Peripheral vascular disease ([place])    Shingles 09/23/2019   Type 2 diabetes mellitus [facility])     Past Surgical History:  Procedure Laterality Date   APPENDECTOMY  1970   BIOPSY  12/08/2017   Procedure: BIOPSY;  Surgeon: [name], MD;  [facility];  Service: Endoscopy;;  esophagus    BIOPSY  12/28/2020   Procedure: BIOPSY;  Surgeon: [name], MD;  [facility];  Service: Endoscopy;;   BREAST SURGERY Right    Cyst resection on the right   CARDIAC CATHETERIZATION  1990's   in [place]. no stent placement   CATARACT EXTRACTION W/PHACO Right 06/18/2016   Procedure: CATARACT EXTRACTION PHACO AND INTRAOCULAR LENS PLACEMENT ([place]);  Surgeon: [name], MD;  Location: [facility] ORS;  Service: Ophthalmology;  Laterality: Right;  [name] 35.7 AP% 16.6 CDE 5.94 Fluid pack lot # [id number]   CATARACT EXTRACTION W/PHACO Left 07/16/2016   Procedure: CATARACT EXTRACTION PHACO AND INTRAOCULAR LENS PLACEMENT (IOC);  Surgeon: [name], MD;  Location: [facility] ORS;  Service: Ophthalmology;  Laterality: Left;  [name] 51.8 AP% 12.7 CDE 6.58 Fluid Pack Lot # [id number]    CHOLECYSTECTOMY  1993   COLONOSCOPY  02/12/2007   FUX:NATFTDDUK friable anal canal hemorrhoids, otherwise normal rectum and colon   COLONOSCOPY N/A 07/16/2012   RMR: Colonic diverticulosis. 4 mm tubular adenoma   COLONOSCOPY WITH PROPOFOL N/A 12/08/2017   Dr. Gala Romney: sigmoid and descending colon diverticulosis, transverse colon polyp (TUBULAR ADENOMA)   ENDARTERECTOMY Right 10/08/2019   Procedure: RIGHT CAROTID ENDARTERECTOMY;  Surgeon: Rosetta Posner, MD;  Location: [facility];  Service: Vascular;  Laterality: Right;   ESOPHAGOGASTRODUODENOSCOPY (EGD) WITH ESOPHAGEAL DILATION N/A 07/16/2012   RMR: +Candida esophagitis, Erosive reflux esophagiits. Schatizi's ring status post dilation. Hiatal hernia. Antral erosions status post biopsy.    ESOPHAGOGASTRODUODENOSCOPY (EGD) WITH PROPOFOL N/A 12/08/2017   Dr. Gala Romney: esophagitis, query EOE but negative for increased eosinophils on path, small hiatal hernia, normal stomach, normal duodenum, PAS stain with rare yeast forms on stain. Empiric dilation   ESOPHAGOGASTRODUODENOSCOPY (EGD) WITH PROPOFOL N/A 12/28/2020   esophagitis without bleeding, s/p biopsy and dilation. Antral erosions s/p biopsy.   EYE SURGERY Bilateral 2018   cataract   HERNIA REPAIR     JOINT  REPLACEMENT     KNEE SURGERY     Left knee arthroscopy torn medial meniscus grade 4 chondral changes medial femoral condyle tibial plateau   MALONEY DILATION N/A 12/08/2017   Procedure: MALONEY DILATION;  Surgeon: [name], MD;  [facility];  Service: Endoscopy;  Laterality: N/A;   MALONEY DILATION N/A 12/28/2020   Procedure: Venia Minks DILATION;  Surgeon: [name], MD;  [facility];  Service: Endoscopy;  Laterality: N/A;   PATCH ANGIOPLASTY Right 10/08/2019   Procedure: PATCH ANGIOPLASTY of the right common carotid artery using hemashield plaltinum finesse patch;  Surgeon: Rosetta Posner, MD;  Location: [facility] OR;  Service: Vascular;  Laterality: Right;   PERIPHERAL  VASCULAR INTERVENTION  07/17/2020   Procedure: PERIPHERAL VASCULAR INTERVENTION;  Surgeon: Waynetta Sandy, MD;  Location: [place] [facility];  Service: Cardiovascular;;   POLYPECTOMY  12/08/2017   Procedure: POLYPECTOMY;  Surgeon: [name], MD;  [facility];  Service: Endoscopy;;  colon   TOTAL KNEE ARTHROPLASTY Left 04/27/2012   Procedure: TOTAL KNEE ARTHROPLASTY;  Surgeon: Carole Civil, MD;  Location: [facility];  Service: Orthopedics;  Laterality: Left;   UMBILICAL HERNIA REPAIR  1993   VISCERAL ANGIOGRAPHY N/A 07/17/2020   Procedure: mesenteric ANGIOGRAPHY;  Surgeon: Waynetta Sandy, MD;  Location: [place] [facility];  Service: Cardiovascular;  Laterality: N/A;    Social History   Socioeconomic History   Marital status: Married    Spouse name: Not on file   Number of children: Not on file   Years of education: Not on file   Highest education level: Not on file  Occupational History   Occupation: Programme researcher, 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   Vaping Use   Vaping Use: Former  Substance and Sexual Activity   Alcohol use: Not Currently    Alcohol/week: 0.0 standard drinks    Comment: socially "beer and tomato juice"    Drug use: No   Sexual activity: Yes    Birth control/protection: None  Other Topics Concern   Not on file  Social History Narrative   Not on file   Social Determinants of Health   Financial Resource Strain: Not on file  Food Insecurity: Not on file  Transportation Needs: Not on file  Physical Activity: Not on file  Stress: Not on file  Social Connections: Not on file    Family History  Problem Relation Age of Onset   Cancer Mother    Hypertension Mother    Colon polyps Mother    Osteoporosis Mother     Cancer Father    Hypertension Father    Coronary artery disease Father        CABG in his 87's   Heart attack Father    Colon cancer Other        maternal great aunt   Colon cancer Other        paternal great aunt    Outpatient Encounter Medications as of 05/09/2021  Medication Sig   insulin aspart protamine - aspart (NOVOLOG MIX 70/30 FLEXPEN) (70-30) 100 UNIT/ML FlexPen Inject 30 Units into the skin 2 (two) times daily with a meal.  Aspirin-Caffeine (BC FAST PAIN RELIEF PO) Take 1 packet by mouth daily as needed (pain).   buPROPion (WELLBUTRIN SR) 150 MG 12 hr tablet Take 150 mg by mouth 2 (two) times daily.   clopidogrel (PLAVIX) 75 MG tablet Take 1 tablet (75 mg total) by mouth daily.   diphenhydrAMINE (BENADRYL) 25 MG tablet Take 25 mg by mouth daily as needed for allergies.    famotidine (PEPCID) 20 MG tablet Take 1 tablet (20 mg total) by mouth 2 (two) times daily.   lisinopril-hydrochlorothiazide (ZESTORETIC) 20-12.5 MG tablet Take 1 tablet by mouth once daily   metFORMIN (GLUCOPHAGE) 1000 MG tablet Take 1,000 mg by mouth 2 (two) times daily with a meal.   metoprolol tartrate (LOPRESSOR) 25 MG tablet Take 1 tablet by mouth twice daily   pantoprazole (PROTONIX) 40 MG tablet Take 1 tablet (40 mg total) by mouth 2 (two) times daily before a meal.   rosuvastatin (CRESTOR) 40 MG tablet Take 1 tablet by mouth once daily   [DISCONTINUED] insulin aspart (NOVOLOG) 100 UNIT/ML injection Inject 10 Units into the skin 3 (three) times daily before meals.   [DISCONTINUED] insulin NPH Human (NOVOLIN N) 100 UNIT/ML injection Inject 35 Units into the skin in the morning and at bedtime.   No facility-administered encounter medications on file as of 05/09/2021.    ALLERGIES: No Known Allergies  VACCINATION STATUS: Immunization History  Administered Date(s) Administered   Pneumococcal Polysaccharide-23 04/23/2013   Tdap 05/04/2011    Diabetes He presents for his initial diabetic visit. He  has type 2 diabetes mellitus. Onset time: He was diagnosed at apprx age of 74 years. His disease course has been worsening. There are no hypoglycemic associated symptoms. Pertinent negatives for hypoglycemia include no confusion, headaches, pallor or seizures. Associated symptoms include blurred vision, polydipsia and polyuria. Pertinent negatives for diabetes include no chest pain, no fatigue, no polyphagia and no weakness. There are no hypoglycemic complications. Symptoms are worsening. Diabetic complications include a CVA, heart disease and PVD. Risk factors for coronary artery disease include diabetes mellitus, dyslipidemia, family history, male sex, hypertension, tobacco exposure and sedentary lifestyle. Current diabetic treatment includes insulin injections (He is on NPH 35 units BID, Novolog 10 units TIDAC, MTF '1000mg'$  BID). His weight is fluctuating minimally. He is following a generally unhealthy diet. When asked about meal planning, he reported none. He has not had a previous visit with a dietitian. He never participates in exercise. His breakfast blood glucose range is generally >200 mg/dl. His lunch blood glucose range is generally >200 mg/dl. His dinner blood glucose range is generally >200 mg/dl. His bedtime blood glucose range is generally >200 mg/dl. His overall blood glucose range is >200 mg/dl. (He presents with his [employer] . AGP shows 1% TIR, 99% TAR. No hypoglycemia, EAG 296 , recent a1c >10%. Last 3 a1c measurements 8.9- 10.6%.) An ACE inhibitor/angiotensin II receptor blocker is being taken.  Hyperlipidemia This is a chronic problem. The current episode started more than 1 year ago. Exacerbating diseases include diabetes. Pertinent negatives include no chest pain, myalgias or shortness of breath. Current antihyperlipidemic treatment includes statins. Risk factors for coronary artery disease include diabetes mellitus, dyslipidemia, family history, hypertension, male sex and a sedentary  lifestyle.  Hypertension This is a chronic problem. The current episode started more than 1 year ago. Associated symptoms include blurred vision. Pertinent negatives include no chest pain, headaches, neck pain, palpitations or shortness of breath. Risk factors for coronary artery disease include dyslipidemia, diabetes mellitus, male  gender, smoking/tobacco exposure, sedentary lifestyle and family history. Past treatments include ACE inhibitors and beta blockers. Hypertensive end-organ damage includes CVA and PVD.    Review of Systems  Constitutional:  Negative for chills, fatigue, fever and unexpected weight change.  HENT:  Negative for dental problem, mouth sores and trouble swallowing.   Eyes:  Positive for blurred vision. Negative for visual disturbance.  Respiratory:  Negative for cough, choking, chest tightness, shortness of breath and wheezing.   Cardiovascular:  Negative for chest pain, palpitations and leg swelling.  Gastrointestinal:  Negative for abdominal distention, abdominal pain, constipation, diarrhea, nausea and vomiting.  Endocrine: Positive for polydipsia and polyuria. Negative for polyphagia.  Genitourinary:  Negative for dysuria, flank pain, hematuria and urgency.  Musculoskeletal:  Negative for back pain, gait problem, myalgias and neck pain.  Skin:  Negative for pallor, rash and wound.  Neurological:  Negative for seizures, syncope, weakness, numbness and headaches.  Psychiatric/Behavioral: Negative.  Negative for confusion and dysphoric mood.    Objective:    Vitals with BMI 05/09/2021 05/08/2021 04/19/2021  Height '5\' 7"'$  '5\' 7"'$  '5\' 7"'$   Weight 150 lbs 13 oz 151 lbs 151 lbs 10 oz  BMI 23.61 63.33 54.56  Systolic 256 - 389  Diastolic 78 - 84  Pulse 72 - 78    BP 130/78    Pulse 72    Ht '5\' 7"'$  (1.702 m)    Wt 150 lb 12.8 oz (68.4 kg)    BMI 23.62 kg/m   Wt Readings from Last 3 Encounters:  05/09/21 150 lb 12.8 oz (68.4 kg)  05/08/21 151 lb (68.5 kg)  04/19/21 151 lb 9.6  oz (68.8 kg)     Physical Exam Constitutional:      General: He is not in acute distress.    Appearance: He is well-developed.     Comments: Suboptimal hygiene   HENT:     Head: Normocephalic and atraumatic.  Neck:     Thyroid: No thyromegaly.     Trachea: No tracheal deviation.  Cardiovascular:     Rate and Rhythm: Normal rate.     Pulses:          Dorsalis pedis pulses are 1+ on the right side and 1+ on the left side.       Posterior tibial pulses are 1+ on the right side and 1+ on the left side.     Heart sounds: Normal heart sounds, S1 normal and S2 normal. No murmur heard.   No gallop.  Pulmonary:     Effort: Pulmonary effort is normal. No respiratory distress.     Breath sounds: Wheezing present.  Abdominal:     General: There is no distension.     Tenderness: There is no abdominal tenderness. There is no guarding.  Musculoskeletal:     Right shoulder: No swelling or deformity.     Cervical back: Normal range of motion and neck supple.  Skin:    General: Skin is warm and dry.     Findings: No rash.     Nails: There is no clubbing.  Neurological:     Mental Status: He is alert and oriented to person, place, and time.     Cranial Nerves: No cranial nerve deficit.     Sensory: No sensory deficit.     Gait: Gait normal.     Deep Tendon Reflexes: Reflexes are normal and symmetric.  Psychiatric:        Speech: Speech normal.  Behavior: Behavior normal. Behavior is cooperative.        Thought Content: Thought content normal.        Judgment: Judgment normal.     Comments: Reluctant affect , stiff, unconcerned attitude      CMP ( most recent) CMP     Component Value Date/Time   NA 133 (L) 04/10/2021 2106   K 3.9 04/10/2021 2106   CL 98 04/10/2021 2106   CO2 25 04/10/2021 2106   GLUCOSE 350 (H) 04/10/2021 2106   BUN 16 04/10/2021 2106   CREATININE 1.25 (H) 04/10/2021 2106   CALCIUM 9.3 04/10/2021 2106   PROT 7.5 10/01/2019 0945   ALBUMIN 4.1 10/01/2019  0945   AST 19 10/01/2019 0945   ALT 31 10/01/2019 0945   ALKPHOS 79 10/01/2019 0945   BILITOT 0.5 10/01/2019 0945   GFRNONAA >60 04/10/2021 2106   GFRAA >60 10/09/2019 0729     Diabetic Labs (most recent): Lab Results  Component Value Date   HGBA1C 9.5 12/23/2020   HGBA1C 8.9 (H) 10/01/2019   HGBA1C 7.8 (H) 01/04/2014   EAG  296, GMI 10.4%   Lipid Panel ( most recent) Lipid Panel     Component Value Date/Time   CHOL 126 05/30/2020 0000   TRIG 375 (A) 05/30/2020 0000   HDL 36 05/30/2020 0000   CHOLHDL NOT REPORTED DUE TO HIGH TRIGLYCERIDES 01/05/2014 0543   VLDL UNABLE TO CALCULATE IF TRIGLYCERIDE OVER 400 mg/dL 01/05/2014 0543   LDLCALC 51 05/30/2020 0000      Lab Results  Component Value Date   TSH 1.790 10/18/2013   TSH 0.797 04/22/2013           Assessment & Plan:   1. DM type 2 causing vascular disease (Jimmy Duran)    - Jimmy Duran has currently uncontrolled symptomatic type 2 DM since  66years of age,  with most recent A1c of  > 10.5%. Recent labs reviewed. - I had a long discussion with him about the progressive nature of diabetes and the pathology behind its complications. -his diabetes is complicated by CVA, CAD, PAD, Carotid atherosclerosis, chronic heavy smoking, sedentary life, COPD and he remains at extremely high risk for more acute and chronic complications which include CAD, CVA, CKD, retinopathy, and neuropathy. These are all discussed in detail with him.  - I discussed all available options of managing his diabetes including de-escalation of medications. I have counseled him on diet  and weight management  by adopting a Whole Food , Plant Predominant  ( WFPP) nutrition as recommended by [employer] of Lifestyle Medicine. Patient is encouraged to switch to  unprocessed or minimally processed  complex starch, adequate protein intake (mainly plant source), minimal liquid fat ( mainly vegetable oils), plenty of fruits, and vegetables. -  he is  advised to stick to a routine mealtimes to eat 3 complete meals a day and snack only when necessary ( to snack only to correct hypoglycemia BG <70 day time or <100 at night).   - he acknowledges that there is a room for improvement in his food and drink choices, reluctantly accepts my recommendations. - Further Specific Suggestion is made for him to avoid simple carbohydrates  from his diet including Cakes, Sweet Desserts, Ice Cream, Soda (diet and regular), Sweet Tea, Candies, Chips, Cookies, Store Bought Juices, Alcohol ,  Artificial Sweeteners,  Coffee Creamer, and "Sugar-free" Products. This will help patient to have more stable blood glucose profile and potentially avoid unintended weight gain.  The following Lifestyle Medicine recommendations according to [facility]) were discussed and offered to patient and he agrees to start the journey:  A. Whole Foods, Plant-based plate comprising of fruits and vegetables, plant-based proteins, whole-grain carbohydrates was discussed in detail with the patient.   A list for source of those nutrients were also provided to the patient.  Patient will use only water or unsweetened tea for hydration. B.  The need to stay away from risky substances including alcohol, smoking; obtaining 7 to 9 hours of restorative sleep, at least 150 minutes of moderate intensity exercise weekly, the importance of healthy social connections,  and stress reduction techniques were discussed.   - he will be scheduled with Jearld Fenton, RDN, CDE for individualized diabetes education.  - I have approached him with the following plan to manage  his diabetes and patient agrees:   - he will benefit more on insulin analogs and premixed insulin BID vs MDI with NPH and Novolog. He has questionable compliance. I discussed and prescribed Novolog 70/30 30 units BID- with breakfast and  with supper if premeal glucose readings are above 90 mg/dl. He is  encouraged to use his CGM continuously.  - he is warned not to take insulin without proper monitoring per orders. - Adjustment parameters are given to him for hypo and hyperglycemia in writing. - he is encouraged to call clinic for blood glucose levels less than 70 or above 300 mg /dl. - he is advised to continue metformin '1000mg'$  po BID  therapeutically suitable for patient . - his NPH and Novolog  will be discontinued.  - he is not a candidate for  incretin therapy due severe dyslipidemia , chronic active heavy smoking at risk for pancreatitis.    - Specific targets for  A1c;  LDL, HDL,  and Triglycerides were discussed with the patient.  2) Blood Pressure /Hypertension:  his blood pressure is controlled to target.   he is advised to continue his current medications including Lisinopril /HCTZ  20/ 12.5 mg p.o. daily with breakfast .  3) Lipids/Hyperlipidemia:   Review of his recent lipid panel showed uncontrolled  Trigs at 375 ( previously  > 1000), LDL 51.  he  is advised to continue  Crestor 40 mg daily at bedtime.  Side effects and precautions discussed with him.  4)  Weight/Diet:  Body mass index is 23.62 kg/m.  -   he is not a candidate for weight loss.  The above detailed  ACLM recommendations for nutrition, exercise, sleep, social life, avoidance of risky substances, the need for restorative sleep   information will also detailed on discharge instructions.  The patient was counseled on the dangers of tobacco use, and was advised to quit.  Reviewed strategies to maximize success, including removing cigarettes and smoking materials from environment.   5) Chronic Care/Health Maintenance:  -he  is on ACEI/ARB and Statin medications and  is encouraged to initiate and continue to follow up with Ophthalmology, Dentist,  Podiatrist at least yearly or according to recommendations, and advised to  quit  smoking. I have recommended yearly flu vaccine and pneumonia vaccine at least every 5  years; moderate intensity exercise for up to 150 minutes weekly; and  sleep for 7- 9 hours a day.  - he is  advised to maintain close follow up with Abran Richard, MD for primary care needs, as well as his other providers for optimal and coordinated care.   I spent 65 minutes  in the care of the patient today including review of labs from [facility], Lipids, Thyroid Function, Hematology (current and previous including abstractions from other facilities); face-to-face time discussing  his blood glucose readings/logs, discussing hypoglycemia and hyperglycemia episodes and symptoms, medications doses, his options of short and long term treatment based on the latest standards of care / guidelines;  discussion about incorporating lifestyle medicine;  and documenting the encounter.     Please refer to Patient Instructions for Blood Glucose Monitoring and Insulin/Medications Dosing Guide"  in media tab for additional information. Please  also refer to " Patient Self Inventory" in the Media  tab for reviewed elements of pertinent patient history.  Jimmy Duran participated in the discussions, expressed understanding, and voiced agreement with the above plans.  All questions were answered to his satisfaction. he is encouraged to contact clinic should he have any questions or concerns prior to his return visit.   Follow up plan: - Return in about 10 days (around 05/19/2021) for F/U with Meter and Logs Only - no Labs.  Glade Lloyd, MD [facility] [address] [place], [place] [address] Phone: [phone]  Fax: [phone]    05/09/2021, 8:48 PM  This note was partially dictated with voice recognition software. Similar sounding words can be transcribed inadequately or may not  be corrected upon review.

## 2021-05-14 ENCOUNTER — Other Ambulatory Visit: Payer: Self-pay

## 2021-05-14 ENCOUNTER — Ambulatory Visit (HOSPITAL_COMMUNITY)
Admission: RE | Admit: 2021-05-14 | Discharge: 2021-05-14 | Disposition: A | Discharge status: Home / Self Care | Payer: Medicare HMO | Source: Ambulatory Visit | Attending: Gastroenterology | Admitting: Gastroenterology

## 2021-05-14 DIAGNOSIS — R1319 Other dysphagia: Secondary | ICD-10-CM | POA: Insufficient documentation

## 2021-05-21 ENCOUNTER — Telehealth: Payer: Self-pay | Admitting: "Endocrinology

## 2021-05-21 ENCOUNTER — Ambulatory Visit: Payer: Medicare HMO | Admitting: Gastroenterology

## 2021-05-21 NOTE — Telephone Encounter (Signed)
Pt called to cancel his further appts with [name], states he was here for a new patient appt last week for his diabetes and was not satisfied because the provider only focused on him to stop smoking. Canceled upcoming appt.  ?

## 2021-05-22 ENCOUNTER — Ambulatory Visit: Payer: Medicare HMO | Admitting: "Endocrinology

## 2021-06-06 ENCOUNTER — Telehealth: Payer: Self-pay | Admitting: *Deleted

## 2021-06-06 ENCOUNTER — Other Ambulatory Visit: Payer: Self-pay | Admitting: Cardiology

## 2021-06-06 ENCOUNTER — Encounter: Payer: Self-pay | Admitting: *Deleted

## 2021-06-06 NOTE — Telephone Encounter (Signed)
Spoke with spouse. Egd/dil ASA 3, Dr. Gala Romney scheduled for 5/11 at 10:30am. Aware will mail instructions/pre-op appt.  ? ? ?PA approved via cohere website. Auth# [id number], DOS: 07/12/2021 - 10/10/2021 ?

## 2021-06-08 ENCOUNTER — Ambulatory Visit: Payer: Medicare HMO | Admitting: Nurse Practitioner

## 2021-07-09 NOTE — Patient Instructions (Signed)
? ? ? ? ? ? ? ? Jimmy Duran ? 07/09/2021  ?  ? '@PREFPERIOPPHARMACY'$ @ ? ? Your procedure is scheduled on 07/12/2021. ? ? Report to [facility] at  0900  A.M. ? ? Call this number if you have problems the morning of surgery: ? [phone] ? ? Remember: ? Follow the diet and prep instructions given to you by the office. ? ?  Take 1/2 of your usual night time insulin the night before your procedure. ? ?  DO NOT take any medications for diabetes the morning of your procedure. ? ? ?  ? Take these medicines the morning of surgery with A SIP OF WATER  ? ?            wellbutrin, pepcid, metoprolol, protonix. ?  ? ? Do not wear jewelry, make-up or nail polish. ? Do not wear lotions, powders, or perfumes, or deodorant. ? Do not shave 48 hours prior to surgery.  Men may shave face and neck. ? Do not bring valuables to the hospital. ? [facility] is not responsible for any belongings or valuables. ? ?Contacts, dentures or bridgework may not be worn into surgery.  Leave your suitcase in the car.  After surgery it may be brought to your room. ? ?For patients admitted to the hospital, discharge time will be determined by your treatment team. ? ?Patients discharged the day of surgery will not be allowed to drive home and must have someone with them for 24 hours.  ? ? ?Special instructions:   DO NOT smoke tobacco or vape for 24 hours before your procedure. ? ?Please read over the following fact sheets that you were given. ?Anesthesia Post-op Instructions and Care and Recovery After Surgery ?  ? ? ? Upper Endoscopy, Adult, Care After ?This sheet gives you information about how to care for yourself after your procedure. Your health care provider may also give you more specific instructions. If you have problems or questions, contact your health care provider. ?What can I expect after the procedure? ?After the procedure, it is common to have: ?A sore throat. ?Mild stomach pain or discomfort. ?Bloating. ?Nausea. ?Follow these  instructions at home: ? ?Follow instructions from your health care provider about what to eat or drink after your procedure. ?Return to your normal activities as told by your health care provider. Ask your health care provider what activities are safe for you. ?Take over-the-counter and prescription medicines only as told by your health care provider. ?If you were given a sedative during the procedure, it can affect you for several hours. Do not drive or operate machinery until your health care provider says that it is safe. ?Keep all follow-up visits as told by your health care provider. This is important. ?Contact a health care provider if you have: ?A sore throat that lasts longer than one day. ?Trouble swallowing. ?Get help right away if: ?You vomit blood or your vomit looks like coffee grounds. ?You have: ?A fever. ?Bloody, black, or tarry stools. ?A severe sore throat or you cannot swallow. ?Difficulty breathing. ?Severe pain in your chest or abdomen. ?Summary ?After the procedure, it is common to have a sore throat, mild stomach discomfort, bloating, and nausea. ?If you were given a sedative during the procedure, it can affect you for several hours. Do not drive or operate machinery until your health care provider says that it is safe. ?Follow instructions from your health care provider about what to eat or drink after your procedure. ?  Return to your normal activities as told by your health care provider. ?This information is not intended to replace advice given to you by your health care provider. Make sure you discuss any questions you have with your health care provider. ?Document Revised: 12/25/2018 Document Reviewed: 07/21/2017 ?Elsevier Patient Education ? [place]. ?Esophageal Dilatation ?Esophageal dilatation, also called esophageal dilation, is a procedure to widen or open a blocked or narrowed part of the esophagus. The esophagus is the part of the body that moves food and liquid from the  mouth to the stomach. You may need this procedure if: ?You have a buildup of scar tissue in your esophagus that makes it difficult, painful, or impossible to swallow. This can be caused by gastroesophageal reflux disease (GERD). ?You have cancer of the esophagus. ?There is a problem with how food moves through your esophagus. ?In some cases, you may need this procedure repeated at a later time to dilate the esophagus gradually. ?Tell a health care provider about: ?Any allergies you have. ?All medicines you are taking, including vitamins, herbs, eye drops, creams, and over-the-counter medicines. ?Any problems you or family members have had with anesthetic medicines. ?Any blood disorders you have. ?Any surgeries you have had. ?Any medical conditions you have. ?Any antibiotic medicines you are required to take before dental procedures. ?Whether you are pregnant or may be pregnant. ?What are the risks? ?Generally, this is a safe procedure. However, problems may occur, including: ?Bleeding due to a tear in the lining of the esophagus. ?A hole, or perforation, in the esophagus. ?What happens before the procedure? ?Ask your health care provider about: ?Changing or stopping your regular medicines. This is especially important if you are taking diabetes medicines or blood thinners. ?Taking medicines such as aspirin and ibuprofen. These medicines can thin your blood. Do not take these medicines unless your health care provider tells you to take them. ?Taking over-the-counter medicines, vitamins, herbs, and supplements. ?Follow instructions from your health care provider about eating or drinking restrictions. ?Plan to have a responsible adult take you home from the hospital or clinic. ?Plan to have a responsible adult care for you for the time you are told after you leave the hospital or clinic. This is important. ?What happens during the procedure? ?You may be given a medicine to help you relax (sedative). ?A numbing medicine  may be sprayed into the back of your throat, or you may gargle the medicine. ?Your health care provider may perform the dilatation using various surgical instruments, such as: ?Simple dilators. This instrument is carefully placed in the esophagus to stretch it. ?Guided wire bougies. This involves using an endoscope to insert a wire into the esophagus. A dilator is passed over this wire to enlarge the esophagus. Then the wire is removed. ?Balloon dilators. An endoscope with a small balloon is inserted into the esophagus. The balloon is inflated to stretch the esophagus and open it up. ?The procedure may vary among health care providers and hospitals. ?What can I expect after the procedure? ?Your blood pressure, heart rate, breathing rate, and blood oxygen level will be monitored until you leave the hospital or clinic. ?Your throat may feel slightly sore and numb. This will get better over time. ?You will not be allowed to eat or drink until your throat is no longer numb. ?When you are able to drink, urinate, and sit on the edge of the bed without nausea or dizziness, you may be able to return home. ?Follow these instructions at  home: ?Take over-the-counter and prescription medicines only as told by your health care provider. ?If you were given a sedative during the procedure, it can affect you for several hours. Do not drive or operate machinery until your health care provider says that it is safe. ?Plan to have a responsible adult care for you for the time you are told. This is important. ?Follow instructions from your health care provider about any eating or drinking restrictions. ?Do not use any products that contain nicotine or tobacco, such as cigarettes, e-cigarettes, and chewing tobacco. If you need help quitting, ask your health care provider. ?Keep all follow-up visits. This is important. ?Contact a health care provider if: ?You have a fever. ?You have pain that is not relieved by medicine. ?Get help right  away if: ?You have chest pain. ?You have trouble breathing. ?You have trouble swallowing. ?You vomit blood. ?You have black, tarry, or bloody stools. ?These symptoms may represent a serious problem that is an e

## 2021-07-10 ENCOUNTER — Encounter (HOSPITAL_COMMUNITY)
Admission: RE | Admit: 2021-07-10 | Discharge: 2021-07-10 | Disposition: A | Discharge status: Home / Self Care | Payer: Medicare HMO | Source: Ambulatory Visit | Attending: Internal Medicine | Admitting: Internal Medicine

## 2021-07-10 VITALS — BP 109/71 | HR 69 | Temp 98.1°F | Resp 18 | Ht 67.0 in | Wt 150.0 lb

## 2021-07-10 DIAGNOSIS — E1159 Type 2 diabetes mellitus with other circulatory complications: Secondary | ICD-10-CM

## 2021-07-11 NOTE — Pre-Procedure Instructions (Signed)
Spoke with wife, Estill Bamberg about patients recent glucose levels. She sates that since he has his [employer] continuous monitor on, his glucose has been running in the mid 100's in the morning. She was afraid of glucose dropping after he stops eating. I told her to make sure they keep check on his levels and to have some sort of clear liquid with sugar in it that he can drink if needed. I also told her to have him make sure he drinks up until the appointed time that his letter form the office tells him to stop.  She is aware to that he is to take 1/2 of his usual insulin dose the night before the procedure. She verbalized understanding of  all of this. ?

## 2021-07-12 ENCOUNTER — Ambulatory Visit (HOSPITAL_BASED_OUTPATIENT_CLINIC_OR_DEPARTMENT_OTHER): Payer: Medicare HMO | Admitting: Certified Registered Nurse Anesthetist

## 2021-07-12 ENCOUNTER — Ambulatory Visit (HOSPITAL_COMMUNITY)
Admission: RE | Admit: 2021-07-12 | Discharge: 2021-07-12 | Disposition: A | Discharge status: Home / Self Care | Payer: Medicare HMO | Attending: Internal Medicine | Admitting: Internal Medicine

## 2021-07-12 ENCOUNTER — Encounter (HOSPITAL_COMMUNITY): Payer: Self-pay | Admitting: Internal Medicine

## 2021-07-12 ENCOUNTER — Encounter (HOSPITAL_COMMUNITY)
Admission: RE | Disposition: A | Discharge status: Home / Self Care | Payer: Self-pay | Source: Home / Self Care | Attending: Internal Medicine

## 2021-07-12 ENCOUNTER — Ambulatory Visit (HOSPITAL_COMMUNITY): Payer: Medicare HMO | Admitting: Certified Registered Nurse Anesthetist

## 2021-07-12 DIAGNOSIS — Z7984 Long term (current) use of oral hypoglycemic drugs: Secondary | ICD-10-CM | POA: Diagnosis not present

## 2021-07-12 DIAGNOSIS — R1310 Dysphagia, unspecified: Secondary | ICD-10-CM | POA: Diagnosis present

## 2021-07-12 DIAGNOSIS — K449 Diaphragmatic hernia without obstruction or gangrene: Secondary | ICD-10-CM | POA: Insufficient documentation

## 2021-07-12 DIAGNOSIS — Z794 Long term (current) use of insulin: Secondary | ICD-10-CM | POA: Insufficient documentation

## 2021-07-12 DIAGNOSIS — I10 Essential (primary) hypertension: Secondary | ICD-10-CM | POA: Diagnosis not present

## 2021-07-12 DIAGNOSIS — Z79899 Other long term (current) drug therapy: Secondary | ICD-10-CM | POA: Insufficient documentation

## 2021-07-12 DIAGNOSIS — K3189 Other diseases of stomach and duodenum: Secondary | ICD-10-CM | POA: Insufficient documentation

## 2021-07-12 DIAGNOSIS — E119 Type 2 diabetes mellitus without complications: Secondary | ICD-10-CM | POA: Diagnosis not present

## 2021-07-12 DIAGNOSIS — K2289 Other specified disease of esophagus: Secondary | ICD-10-CM | POA: Insufficient documentation

## 2021-07-12 DIAGNOSIS — F17210 Nicotine dependence, cigarettes, uncomplicated: Secondary | ICD-10-CM | POA: Insufficient documentation

## 2021-07-12 DIAGNOSIS — K219 Gastro-esophageal reflux disease without esophagitis: Secondary | ICD-10-CM | POA: Insufficient documentation

## 2021-07-12 DIAGNOSIS — K222 Esophageal obstruction: Secondary | ICD-10-CM

## 2021-07-12 DIAGNOSIS — J449 Chronic obstructive pulmonary disease, unspecified: Secondary | ICD-10-CM

## 2021-07-12 HISTORY — PX: ESOPHAGOGASTRODUODENOSCOPY (EGD) WITH PROPOFOL: SHX5813

## 2021-07-12 HISTORY — PX: MALONEY DILATION: SHX5535

## 2021-07-12 HISTORY — PX: BIOPSY: SHX5522

## 2021-07-12 LAB — GLUCOSE, CAPILLARY: Glucose-Capillary: 150 mg/dL — ABNORMAL HIGH (ref 70–99)

## 2021-07-12 SURGERY — ESOPHAGOGASTRODUODENOSCOPY (EGD) WITH PROPOFOL
Anesthesia: General

## 2021-07-12 MED ORDER — LIDOCAINE HCL (PF) 2 % IJ SOLN
INTRAMUSCULAR | Status: AC
  Filled 2021-07-12: qty 5

## 2021-07-12 MED ORDER — ETOMIDATE 2 MG/ML IV SOLN
INTRAVENOUS | Status: DC | PRN
  Administered 2021-07-12: 6 mg via INTRAVENOUS

## 2021-07-12 MED ORDER — PROPOFOL 10 MG/ML IV BOLUS
INTRAVENOUS | Status: DC | PRN
  Administered 2021-07-12: 80 mg via INTRAVENOUS

## 2021-07-12 MED ORDER — PROPOFOL 500 MG/50ML IV EMUL
INTRAVENOUS | Status: DC | PRN
  Administered 2021-07-12: 180 ug/kg/min via INTRAVENOUS

## 2021-07-12 MED ORDER — LACTATED RINGERS IV SOLN
INTRAVENOUS | Status: DC
  Administered 2021-07-12: 1000 mL via INTRAVENOUS

## 2021-07-12 NOTE — Discharge Instructions (Signed)
EGD ?Discharge instructions ?Please read the instructions outlined below and refer to this sheet in the next few weeks. These discharge instructions provide you with general information on caring for yourself after you leave the hospital. Your doctor may also give you specific instructions. While your treatment has been planned according to the most current medical practices available, unavoidable complications occasionally occur. If you have any problems or questions after discharge, please call your doctor. ?ACTIVITY ?You may resume your regular activity but move at a slower pace for the next 24 hours.  ?Take frequent rest periods for the next 24 hours.  ?Walking will help expel (get rid of) the air and reduce the bloated feeling in your abdomen.  ?No driving for 24 hours (because of the anesthesia (medicine) used during the test).  ?You may shower.  ?Do not sign any important legal documents or operate any machinery for 24 hours (because of the anesthesia used during the test).  ?NUTRITION ?Drink plenty of fluids.  ?You may resume your normal diet.  ?Begin with a light meal and progress to your normal diet.  ?Avoid alcoholic beverages for 24 hours or as instructed by your caregiver.  ?MEDICATIONS ?You may resume your normal medications unless your caregiver tells you otherwise.  ?WHAT YOU CAN EXPECT TODAY ?You may experience abdominal discomfort such as a feeling of fullness or ?gas? pains.  ?FOLLOW-UP ?Your doctor will discuss the results of your test with you.  ?SEEK IMMEDIATE MEDICAL ATTENTION IF ANY OF THE FOLLOWING OCCUR: ?Excessive nausea (feeling sick to your stomach) and/or vomiting.  ?Severe abdominal pain and distention (swelling).  ?Trouble swallowing.  ?Temperature over 101? F (37.8? C).  ?Rectal bleeding or vomiting of blood.   ? ?Your esophagus was stretched today.  Your esophagus and stomach were biopsied ? ?Further recommendations to follow pending review of pathology report ? ?Keep your upcoming  appointment with Roseanne Kaufman ? ?At patient request, I called Estill Bamberg at [phone] -reviewed findings and recommendations ?

## 2021-07-12 NOTE — Op Note (Signed)
[facility] ?Patient Name: Jimmy Duran ?Procedure Date: 07/12/2021 10:08 AM ?MRN: [id number] ?Date of Birth: February 22, 1956 ?Attending MD: Norvel Richards , MD ?CSN: [id number] ?Age: 66 ?Admit Type: Outpatient ?Procedure:                Upper GI endoscopy ?Indications:              Dysphagia ?Providers:                Norvel Richards, MD, Janeece Riggers, RN, Crystal  ?                          Page ?Referring MD:              ?Medicines:                Propofol per Anesthesia ?Complications:            No immediate complications. ?Estimated Blood Loss:     Estimated blood loss was minimal. ?Procedure:                Pre-Anesthesia Assessment: ?                          - Prior to the procedure, a History and Physical  ?                          was performed, and patient medications and  ?                          allergies were reviewed. The patient's tolerance of  ?                          previous anesthesia was also reviewed. The risks  ?                          and benefits of the procedure and the sedation  ?                          options and risks were discussed with the patient.  ?                          All questions were answered, and informed consent  ?                          was obtained. Prior Anticoagulants: The patient has  ?                          taken no previous anticoagulant or antiplatelet  ?                          agents. ASA Grade Assessment: III - A patient with  ?                          severe systemic disease. After reviewing the risks  ?  and benefits, the patient was deemed in  ?                          satisfactory condition to undergo the procedure. ?                          After obtaining informed consent, the endoscope was  ?                          passed under direct vision. Throughout the  ?                          procedure, the patient's blood pressure, pulse, and  ?                          oxygen saturations were monitored  continuously. The  ?                          GIF-H190 ([id number]) scope was introduced through the  ?                          mouth, and advanced to the second part of duodenum.  ?                          The upper GI endoscopy was accomplished without  ?                          difficulty. The patient tolerated the procedure  ?                          well. The upper GI endoscopy was accomplished  ?                          without difficulty. The patient tolerated the  ?                          procedure well. ?Scope In: 10:17:35 AM ?Scope Out: 10:25:52 AM ?Total Procedure Duration: 0 hours 8 minutes 17 seconds  ?Findings: ?     Abnormal distal 10 cm of esophageal mucosa with "cobblestoning"  ?     appearance. Did not appear to be consistent with a EOE or typical  ?     reflux. No out and out ulcers or erosions. Slight narrowing of the  ?     distal esophagus. No tumor. Gastric body and antral erosions. Small  ?     hiatal hernia. Patent pylorus. ?     The duodenal bulb and second portion of the duodenum were normal. The  ?     scope was withdrawn. Dilation was performed with a Maloney dilator with  ?     moderate resistance at 56 Fr. The dilation site was examined and showed  ?     moderate mucosal disruption. Estimated blood loss was minimal. Finally,  ?     biopsies mid and distal esophagus were taken for histologic study. Also  ?     gastric biopsies were taken. ?Impression:  Abnormal esophagus as described above. Query  ?                          esophageal lichen planus. Status post dilation and  ?                          biopsy. Gastric erosions?status post biopsy ?                          - Normal duodenal bulb and second portion of the  ?                          duodenum. ?                          - ?Moderate Sedation: ?     Moderate (conscious) sedation was personally administered by an  ?     anesthesia professional. The following parameters were monitored: oxygen  ?     saturation,  heart rate, blood pressure, respiratory rate, EKG, adequacy  ?     of pulmonary ventilation, and response to care. ?Recommendation:           - Patient has a contact number available for  ?                          emergencies. The signs and symptoms of potential  ?                          delayed complications were discussed with the  ?                          patient. Return to normal activities tomorrow.  ?                          Written discharge instructions were provided to the  ?                          patient. ?                          -Advance diet as tolerated. ?                          - Continue present medications. ?                          - Return to my office (date not yet determined). ?Procedure Code(s):        --- Professional --- ?                          [phone], Esophagogastroduodenoscopy, flexible,  ?                          transoral; diagnostic, including collection of  ?                          specimen(s) by brushing or  washing, when performed  ?                          (separate procedure) ?                          43450, Dilation of esophagus, by unguided sound or  ?                          bougie, single or multiple passes ?Diagnosis Code(s):        --- Professional --- ?                          R13.10, Dysphagia, unspecified ?CPT copyright 2019 [employer]. All rights reserved. ?The codes documented in this report are preliminary and upon coder review may  ?be revised to meet current compliance requirements. ?[name]. , MD ?Norvel Richards, MD ?07/12/2021 10:37:38 AM ?This report has been signed electronically. ?Number of Addenda: 0 ?

## 2021-07-12 NOTE — Anesthesia Postprocedure Evaluation (Signed)
Anesthesia Post Note ? ?Patient: Jimmy Duran [facility] ? ?Procedure(s) Performed: ESOPHAGOGASTRODUODENOSCOPY (EGD) WITH PROPOFOL ?MALONEY DILATION ?BIOPSY ? ?Patient location during evaluation: Phase II ?Anesthesia Type: General ?Level of consciousness: awake ?Pain management: pain level controlled ?Vital Signs Assessment: post-procedure vital signs reviewed and stable ?Respiratory status: spontaneous breathing and respiratory function stable ?Cardiovascular status: blood pressure returned to baseline and stable ?Postop Assessment: no headache and no apparent nausea or vomiting ?Anesthetic complications: no ?Comments: Late entry ? ? ?No notable events documented. ? ? ?Last Vitals:  ?Vitals:  ? 07/12/21 0939 07/12/21 1030  ?BP: (!) 149/96 130/61  ?Pulse: 65 76  ?Resp: 20 18  ?Temp: 36.6 ?C 36.7 ?C  ?SpO2: 99% 99%  ?  ?Last Pain:  ?Vitals:  ? 07/12/21 1030  ?TempSrc: Axillary  ?PainSc: 0-No pain  ? ? ?  ?  ?  ?  ?  ?  ? ?Louann Sjogren ? ? ? ? ?

## 2021-07-12 NOTE — H&P (Signed)
$'@LOGO't$ @ ? ? ?Primary Care Physician:  Abran Richard, MD ?Primary Gastroenterologist:  Dr. Gala Romney ? ?Pre-Procedure History & Physical: ?HPI:  Jimmy Duran is a 66 y.o. male here for further evaluation/management of dysphagia.  History of severe reflux esophagitis.  Esophagus previously dilated. ?Spontaneous dilation previously.  Biopsies negative for EOE.  Has been on pantoprazole twice daily with fairly good control of GERD symptoms. ? ?Past Medical History:  ?Diagnosis Date  ? AAA (abdominal aortic aneurysm) ([place])   ? Arthritis   ? Carotid artery disease ([place])   ? Colon polyp   ? COPD (chronic obstructive pulmonary disease) ([place])   ? Coronary artery disease   ? Nonobstructive  ? Essential hypertension   ? GERD (gastroesophageal reflux disease)   ? Hemorrhoids   ? History of TIA (transient ischemic attack)   ? Low back pain   ? Lumbar radiculopathy   ? Mixed hyperlipidemia   ? Palpitations   ? Peripheral vascular disease ([place])   ? Shingles 09/23/2019  ? Type 2 diabetes mellitus ([place])   ? ? ?Past Surgical History:  ?Procedure Laterality Date  ? APPENDECTOMY  1970  ? BIOPSY  12/08/2017  ? Procedure: BIOPSY;  Surgeon: [name], MD;  [facility];  Service: Endoscopy;;  esophagus ?  ? BIOPSY  12/28/2020  ? Procedure: BIOPSY;  Surgeon: [name], MD;  [facility];  Service: Endoscopy;;  ? BREAST SURGERY Right   ? Cyst resection on the right  ? CARDIAC CATHETERIZATION  1990's  ? in [place]. no stent placement  ? CATARACT EXTRACTION W/PHACO Right 06/18/2016  ? Procedure: CATARACT EXTRACTION PHACO AND INTRAOCULAR LENS PLACEMENT (IOC);  Surgeon: [name], MD;  Location: [facility];  Service: Ophthalmology;  Laterality: Right;  [name] 35.7 ?AP% 16.6 ?CDE 5.94 ?Fluid pack lot # [id number]  ? CATARACT EXTRACTION W/PHACO Left 07/16/2016  ? Procedure: CATARACT EXTRACTION PHACO AND INTRAOCULAR LENS PLACEMENT (IOC);  Surgeon: [name], MD;  Location: [facility];  Service:  Ophthalmology;  Laterality: Left;  [name] 51.8 ?AP% 12.7 ?CDE 6.58 ?Fluid Pack Lot # [id number]  ? CHOLECYSTECTOMY  1993  ? COLONOSCOPY  02/12/2007  ? OEU:MPNTIRWER friable anal canal hemorrhoids, otherwise normal rectum and colon  ? COLONOSCOPY N/A 07/16/2012  ? RMR: Colonic diverticulosis. 4 mm tubular adenoma  ? COLONOSCOPY WITH PROPOFOL N/A 12/08/2017  ? Dr. Gala Romney: sigmoid and descending colon diverticulosis, transverse colon polyp (TUBULAR ADENOMA)  ? ENDARTERECTOMY Right 10/08/2019  ? Procedure: RIGHT CAROTID ENDARTERECTOMY;  Surgeon: Rosetta Posner, MD;  Location: [facility] OR;  Service: Vascular;  Laterality: Right;  ? ESOPHAGOGASTRODUODENOSCOPY (EGD) WITH ESOPHAGEAL DILATION N/A 07/16/2012  ? RMR: +Candida esophagitis, Erosive reflux esophagiits. Schatizi's ring status post dilation. Hiatal hernia. Antral erosions status post biopsy.   ? ESOPHAGOGASTRODUODENOSCOPY (EGD) WITH PROPOFOL N/A 12/08/2017  ? Dr. Gala Romney: esophagitis, query EOE but negative for increased eosinophils on path, small hiatal hernia, normal stomach, normal duodenum, PAS stain with rare yeast forms on stain. Empiric dilation  ? ESOPHAGOGASTRODUODENOSCOPY (EGD) WITH PROPOFOL N/A 12/28/2020  ? esophagitis without bleeding, s/p biopsy and dilation. Antral erosions s/p biopsy.  ? EYE SURGERY Bilateral 2018  ? cataract  ? HERNIA REPAIR    ? JOINT REPLACEMENT    ? KNEE SURGERY    ? Left knee arthroscopy torn medial meniscus grade 4 chondral changes medial femoral condyle tibial plateau  ? MALONEY DILATION N/A 12/08/2017  ? Procedure: MALONEY DILATION;  Surgeon: [name], MD;  [facility];  Service: Endoscopy;  Laterality: N/A;  ? MALONEY DILATION N/A 12/28/2020  ? Procedure: MALONEY DILATION;  Surgeon: [name], MD;  [facility];  Service: Endoscopy;  Laterality: N/A;  ? PATCH ANGIOPLASTY Right 10/08/2019  ? Procedure: PATCH ANGIOPLASTY of the right common carotid artery using hemashield plaltinum finesse patch;  Surgeon:  Rosetta Posner, MD;  Location: [place];  Service: Vascular;  Laterality: Right;  ? PERIPHERAL VASCULAR INTERVENTION  07/17/2020  ? Procedure: PERIPHERAL VASCULAR INTERVENTION;  Surgeon: Waynetta Sandy, MD;  Location: [place] [facility];  Service: Cardiovascular;;  ? POLYPECTOMY  12/08/2017  ? Procedure: POLYPECTOMY;  Surgeon: [name], MD;  [facility];  Service: Endoscopy;;  colon  ? TOTAL KNEE ARTHROPLASTY Left 04/27/2012  ? Procedure: TOTAL KNEE ARTHROPLASTY;  Surgeon: Carole Civil, MD;  Location: [facility];  Service: Orthopedics;  Laterality: Left;  ? [place]  ? VISCERAL ANGIOGRAPHY N/A 07/17/2020  ? Procedure: mesenteric ANGIOGRAPHY;  Surgeon: Waynetta Sandy, MD;  Location: [place] [facility];  Service: Cardiovascular;  Laterality: N/A;  ? ? ?Prior to Admission medications   ?Medication Sig Start Date End Date Taking? Authorizing Provider  ?Aspirin-Caffeine (BC FAST PAIN RELIEF PO) Take 1 packet by mouth daily as needed (pain).   Yes [provider]  ?buPROPion (WELLBUTRIN SR) 150 MG 12 hr tablet Take 150 mg by mouth 2 (two) times daily.   Yes [provider]  ?clopidogrel (PLAVIX) 75 MG tablet Take 1 tablet (75 mg total) by mouth daily. 07/17/20 07/17/21 Yes Waynetta Sandy, MD  ?diphenhydrAMINE (BENADRYL) 25 MG tablet Take 25 mg by mouth daily as needed for allergies.    Yes [provider]  ?famotidine (PEPCID) 20 MG tablet Take 1 tablet (20 mg total) by mouth 2 (two) times daily. 05/08/21  Yes Annitta Needs, NP  ?insulin aspart protamine - aspart (NOVOLOG MIX 70/30 FLEXPEN) (70-30) 100 UNIT/ML FlexPen Inject 30 Units into the skin 2 (two) times daily with a meal. 05/09/21  Yes Nida, Marella Chimes, MD  ?lisinopril-hydrochlorothiazide (ZESTORETIC) 20-12.5 MG tablet Take 1 tablet by mouth once daily 06/06/21  Yes Satira Sark, MD  ?metFORMIN (GLUCOPHAGE) 1000 MG tablet Take 1,000 mg by mouth 2 (two) times daily  with a meal.   Yes [provider]  ?metoprolol tartrate (LOPRESSOR) 25 MG tablet Take 1 tablet by mouth twice daily 06/06/21  Yes Satira Sark, MD  ?pantoprazole (PROTONIX) 40 MG tablet Take 1 tablet (40 mg total) by mouth 2 (two) times daily before a meal. 11/21/20  Yes Annitta Needs, NP  ?rosuvastatin (CRESTOR) 40 MG tablet Take 1 tablet by mouth once daily 08/28/20  Yes Verta Ellen., NP  ? ? ?Allergies as of 06/06/2021  ? (No Known Allergies)  ? ? ?Family History  ?Problem Relation Age of Onset  ? Cancer Mother   ? Hypertension Mother   ? Colon polyps Mother   ? Osteoporosis Mother   ? Cancer Father   ? Hypertension Father   ? Coronary artery disease Father   ?     CABG in his 82's  ? Heart attack Father   ? Colon cancer Other   ?     maternal great aunt  ? Colon cancer Other   ?     paternal great aunt  ? ? ?Social History  ? ?Socioeconomic History  ? Marital status: Married  ?  Spouse name: Not on file  ? Number of children: Not on file  ? Years of education: Not on file  ? Highest education level: Not on file  ?Occupational History  ? Occupation: Warehouse manager  ?  Employer: [employer]  ?Tobacco Use  ? Smoking status: Every Day  ?  Packs/day: 1.00  ?  Years: 30.00  ?  Pack years: 30.00  ?  Types: Cigarettes  ? Smokeless tobacco: Former  ?  Types: Chew  ? Tobacco comments:  ?  1 pk per day.   ?Vaping Use  ? Vaping Use: Former  ?Substance and Sexual Activity  ? Alcohol use: Not Currently  ?  Alcohol/week: 0.0 standard drinks  ?  Comment: socially "beer and tomato juice"   ? Drug use: No  ? Sexual activity: Yes  ?  Birth control/protection: None  ?Other Topics Concern  ? Not on file  ?Social History Narrative  ? Not on file  ? ?Social Determinants of Health  ? ?Financial Resource Strain: Not on file  ?Food Insecurity: Not on file  ?Transportation Needs: Not on file  ?Physical Activity: Not on file  ?Stress: Not on file  ?Social Connections: Not on file  ?Intimate Partner Violence: Not on  file  ? ? ?Review of Systems: ?See HPI, otherwise negative ROS ? ?Physical Exam: ?BP (!) 149/96   Pulse 65   Temp 97.8 ?F (36.6 ?C) (Oral)   Resp 20   Ht '5\' 7"'$  (1.702 m)   Wt 68 kg   SpO2 99%   BMI 23.48 kg/m?

## 2021-07-12 NOTE — Transfer of Care (Signed)
Immediate Anesthesia Transfer of Care Note ? ?Patient: Jimmy Duran [facility] ? ?Procedure(s) Performed: ESOPHAGOGASTRODUODENOSCOPY (EGD) WITH PROPOFOL ?MALONEY DILATION ?BIOPSY ? ?Patient Location: PACU ? ?Anesthesia Type:General ? ?Level of Consciousness: awake, alert  and oriented ? ?Airway & Oxygen Therapy: Patient Spontanous Breathing ? ?Post-op Assessment: Report given to RN, Post -op Vital signs reviewed and stable, Patient moving all extremities X 4 and Patient able to stick tongue midline ? ?Post vital signs: Reviewed ? ?Last Vitals:  ?Vitals Value Taken Time  ?BP 130/61   ?Temp 97.8   ?Pulse 56   ?Resp 18   ?SpO2 99   ? ? ?Last Pain:  ?Vitals:  ? 07/12/21 1015  ?TempSrc:   ?PainSc: 0-No pain  ?   ? ?Patients Stated Pain Goal: 8 (07/12/21 3496) ? ?Complications: No notable events documented. ?

## 2021-07-12 NOTE — Anesthesia Preprocedure Evaluation (Signed)
Anesthesia Evaluation  ?Patient identified by MRN, date of birth, ID band ?Patient awake ? ? ? ?Reviewed: ?Allergy & Precautions, H&P , NPO status , Patient's Chart, lab work & pertinent test results, reviewed documented beta blocker date and time  ? ?Airway ?Mallampati: II ? ?TM Distance: >3 FB ?Neck ROM: full ? ? ? Dental ?no notable dental hx. ? ?  ?Pulmonary ?COPD, Current Smoker,  ?  ?Pulmonary exam normal ?breath sounds clear to auscultation ? ? ? ? ? ? Cardiovascular ?Exercise Tolerance: Good ?hypertension, negative cardio ROS ? ? ?Rhythm:regular Rate:Normal ? ? ?  ?Neuro/Psych ? Neuromuscular disease negative psych ROS  ? GI/Hepatic ?Neg liver ROS, GERD  Medicated,  ?Endo/Other  ?negative endocrine ROSdiabetes, Type 2 ? Renal/GU ?negative Renal ROS  ?negative genitourinary ?  ?Musculoskeletal ? ? Abdominal ?  ?Peds ? Hematology ?negative hematology ROS ?(+)   ?Anesthesia Other Findings ? ? Reproductive/Obstetrics ?negative OB ROS ? ?  ? ? ? ? ? ? ? ? ? ? ? ? ? ?  ?  ? ? ? ? ? ? ? ? ?Anesthesia Physical ?Anesthesia Plan ? ?ASA: 3 ? ?Anesthesia Plan: General  ? ?Post-op Pain Management:   ? ?Induction:  ? ?PONV Risk Score and Plan: Propofol infusion ? ?Airway Management Planned:  ? ?Additional Equipment:  ? ?Intra-op Plan:  ? ?Post-operative Plan:  ? ?Informed Consent: I have reviewed the patients History and Physical, chart, labs and discussed the procedure including the risks, benefits and alternatives for the proposed anesthesia with the patient or authorized representative who has indicated his/her understanding and acceptance.  ? ? ? ?Dental Advisory Given ? ?Plan Discussed with: CRNA ? ?Anesthesia Plan Comments:   ? ? ? ? ? ? ?Anesthesia Quick Evaluation ? ?

## 2021-07-16 ENCOUNTER — Encounter: Payer: Self-pay | Admitting: Internal Medicine

## 2021-07-16 LAB — SURGICAL PATHOLOGY

## 2021-07-19 ENCOUNTER — Encounter (HOSPITAL_COMMUNITY): Payer: Self-pay | Admitting: Internal Medicine

## 2021-08-07 ENCOUNTER — Other Ambulatory Visit: Payer: Self-pay | Admitting: Gastroenterology

## 2021-08-09 ENCOUNTER — Ambulatory Visit (INDEPENDENT_AMBULATORY_CARE_PROVIDER_SITE_OTHER): Payer: Medicare HMO | Admitting: Nurse Practitioner

## 2021-08-09 ENCOUNTER — Encounter: Payer: Self-pay | Admitting: Nurse Practitioner

## 2021-08-09 VITALS — BP 137/70 | HR 72 | Ht 67.0 in | Wt 150.0 lb

## 2021-08-09 DIAGNOSIS — I10 Essential (primary) hypertension: Secondary | ICD-10-CM

## 2021-08-09 DIAGNOSIS — F17200 Nicotine dependence, unspecified, uncomplicated: Secondary | ICD-10-CM

## 2021-08-09 DIAGNOSIS — J449 Chronic obstructive pulmonary disease, unspecified: Secondary | ICD-10-CM | POA: Diagnosis not present

## 2021-08-09 DIAGNOSIS — E782 Mixed hyperlipidemia: Secondary | ICD-10-CM

## 2021-08-09 DIAGNOSIS — E1159 Type 2 diabetes mellitus with other circulatory complications: Secondary | ICD-10-CM | POA: Diagnosis not present

## 2021-08-09 MED ORDER — CLOPIDOGREL BISULFATE 75 MG PO TABS: 75.0000 mg | ORAL_TABLET | Freq: Every day | ORAL | 11 refills | Status: DC

## 2021-08-09 MED ORDER — TRELEGY ELLIPTA 100-62.5-25 MCG/ACT IN AEPB
1.0000 | INHALATION_SPRAY | Freq: Every day | RESPIRATORY_TRACT | 3 refills | Status: DC
Start: 2021-08-09 — End: 2021-10-19

## 2021-08-09 MED ORDER — ALBUTEROL SULFATE HFA 108 (90 BASE) MCG/ACT IN AERS: 2.0000 | INHALATION_SPRAY | Freq: Four times a day (QID) | RESPIRATORY_TRACT | 2 refills | Status: DC | PRN

## 2021-08-09 NOTE — Patient Instructions (Signed)
Pleas get your shingles vaccine, TDAP vaccine, COVID booster at your pharmacy.  Pleas get your fasting labs done tomorrow as planned.  Pleas follow up with Dr Dorris Fetch as dicussed.   It is important that you exercise regularly at least 30 minutes 5 times a week.  Think about what you will eat, plan ahead. Choose " clean, green, fresh or frozen" over canned, processed or packaged foods which are more sugary, salty and fatty. 70 to 75% of food eaten should be vegetables and fruit. Three meals at set times with snacks allowed between meals, but they must be fruit or vegetables. Aim to eat over a 12 hour period , example 7 am to 7 pm, and STOP after  your last meal of the day. Drink water,generally about 64 ounces per day, no other drink is as healthy. Fruit juice is best enjoyed in a healthy way, by EATING the fruit.  Thanks for choosing [facility], we consider it a privelige to serve you.

## 2021-08-09 NOTE — Assessment & Plan Note (Signed)
BP Readings from Last 3 Encounters:  08/09/21 137/70  07/12/21 130/61  07/10/21 109/71  Chronic condition well-controlled on lisinopril-hydrochlorothiazide 20-12.5 mg 1 tablet daily, metoprolol 25 mg twice daily. DASH diet advised need to take medication daily as prescribed discussed with patient CMP today

## 2021-08-09 NOTE — Assessment & Plan Note (Signed)
Chronic condition Reports wheezing shortness of breath Continues to smoke, smokes half a pack daily smoking cessation education completed Rx Trelegy 1 inhalation daily, albuterol inhaler 2 puffs every 6 hours as needed Low-dose chest CT ordered Follow-up in 4 months

## 2021-08-09 NOTE — Progress Notes (Signed)
New Patient Office Visit  Subjective    Patient ID: Jimmy Duran, male    DOB: 08/25/1955  Age: 66 y.o. MRN: [id number]  CC:  Chief Complaint  Patient presents with   New Patient (Initial Visit)    np   Weight Loss    For the last 4-5 years    HPI [name] with past medical history of hypertension, CAD, heart carotid artery disease, uncontrolled type 2 diabetes, COPD, mixed hyperlipidemia lipidemia, presents to establish care for his chronic medical conditions. Previous PCP is Dr Abran Richard at [facility] ,last visit was last year.   T2DM.  Patient stated that he has stopped taking novolog mix 70/30, metformin 1000 mix since about one month ago , past CGM n place reports CBG of 93, this morning he stated that his fasting blood sugar is usually between 150-300.  Patient denies hypoglycemia  AAA, superior mesenteric artery stenosis.  Had history of right carotid endarterectomy, had stents placed in his mesenteric artery in 2022. patient denies chest pain, abdominal pain dizziness has upcoming appointment with Dr. Donnetta Hutching.  He has run out of Plavix, Plavix refilled today  Current smoker. Started smoking at age 56, he was smoking 3 packs daily , he has cut down to half a pack of cigarettes daily since about 4 years ago, has wheezing, SOB , Cough. He stated that he has quit taking his inhalers. Has tried trelegy and breo in the past , he stated that none of them helped .  He has never had lung cancer screening done.   Patient is due for shingles vaccine, COVID booster, tetanus vaccine all vaccines discussed with patient patient advised to get vaccines at his pharmacy he verbalized understanding.   ,  Outpatient Encounter Medications as of 08/09/2021  Medication Sig   albuterol (VENTOLIN HFA) 108 (90 Base) MCG/ACT inhaler Inhale 2 puffs into the lungs every 6 (six) hours as needed for wheezing or shortness of breath.   Aspirin-Caffeine (BC FAST PAIN RELIEF PO) Take 1 packet  by mouth daily as needed (pain).   diphenhydrAMINE (BENADRYL) 25 MG tablet Take 25 mg by mouth daily as needed for allergies.    famotidine (PEPCID) 20 MG tablet Take 1 tablet (20 mg total) by mouth 2 (two) times daily.   Fluticasone-Umeclidin-Vilant (TRELEGY ELLIPTA) 100-62.5-25 MCG/ACT AEPB Inhale 1 Inhalation into the lungs daily.   lisinopril-hydrochlorothiazide (ZESTORETIC) 20-12.5 MG tablet Take 1 tablet by mouth once daily   metoprolol tartrate (LOPRESSOR) 25 MG tablet Take 1 tablet by mouth twice daily   pantoprazole (PROTONIX) 40 MG tablet TAKE 1 TABLET BY MOUTH TWICE DAILY BEFORE A MEAL   rosuvastatin (CRESTOR) 40 MG tablet Take 1 tablet by mouth once daily   buPROPion (WELLBUTRIN SR) 150 MG 12 hr tablet Take 150 mg by mouth 2 (two) times daily. (Patient not taking: Reported on 08/09/2021)   clopidogrel (PLAVIX) 75 MG tablet Take 1 tablet (75 mg total) by mouth daily.   insulin aspart protamine - aspart (NOVOLOG MIX 70/30 FLEXPEN) (70-30) 100 UNIT/ML FlexPen Inject 30 Units into the skin 2 (two) times daily with a meal. (Patient not taking: Reported on 08/09/2021)   metFORMIN (GLUCOPHAGE) 1000 MG tablet Take 1,000 mg by mouth 2 (two) times daily with a meal. (Patient not taking: Reported on 08/09/2021)   No facility-administered encounter medications on file as of 08/09/2021.    Past Medical History:  Diagnosis Date   AAA (abdominal aortic aneurysm) ([place])  Arthritis    Carotid artery disease (HCC)    Colon polyp    COPD (chronic obstructive pulmonary disease) (HCC)    Coronary artery disease    Nonobstructive   Essential hypertension    GERD (gastroesophageal reflux disease)    Hemorrhoids    History of TIA (transient ischemic attack)    Low back pain    Lumbar radiculopathy    Mixed hyperlipidemia    Palpitations    Peripheral vascular disease ([place])    Shingles 09/23/2019   Type 2 diabetes mellitus [facility])     Past Surgical History:  Procedure Laterality Date   APPENDECTOMY   1970   BIOPSY  12/08/2017   Procedure: BIOPSY;  Surgeon: [name], MD;  [facility];  Service: Endoscopy;;  esophagus    BIOPSY  12/28/2020   Procedure: BIOPSY;  Surgeon: [name], MD;  [facility];  Service: Endoscopy;;   BIOPSY  07/12/2021   Procedure: BIOPSY;  Surgeon: [name], MD;  [facility];  Service: Endoscopy;;   BREAST SURGERY Right    Cyst resection on the right   CARDIAC CATHETERIZATION  1990's   in [place]. no stent placement   CATARACT EXTRACTION W/PHACO Right 06/18/2016   Procedure: CATARACT EXTRACTION PHACO AND INTRAOCULAR LENS PLACEMENT ([place]);  Surgeon: [name], MD;  Location: [facility] ORS;  Service: Ophthalmology;  Laterality: Right;  [name] 35.7 AP% 16.6 CDE 5.94 Fluid pack lot # [id number]   CATARACT EXTRACTION W/PHACO Left 07/16/2016   Procedure: CATARACT EXTRACTION PHACO AND INTRAOCULAR LENS PLACEMENT ([facility]);  Surgeon: [name], MD;  Location: [facility] ORS;  Service: Ophthalmology;  Laterality: Left;  [name] 51.8 AP% 12.7 CDE 6.58 Fluid Pack Lot # [id number]   CHOLECYSTECTOMY  1993   COLONOSCOPY  02/12/2007   LDJ:[id number] friable anal canal hemorrhoids, otherwise normal rectum and colon   COLONOSCOPY N/A 07/16/2012   RMR: Colonic diverticulosis. 4 mm tubular adenoma   COLONOSCOPY WITH PROPOFOL N/A 12/08/2017   Dr. Gala Romney: sigmoid and descending colon diverticulosis, transverse colon polyp (TUBULAR ADENOMA)   ENDARTERECTOMY Right 10/08/2019   Procedure: RIGHT CAROTID ENDARTERECTOMY;  Surgeon: Rosetta Posner, MD;  [facility];  Service: Vascular;  Laterality: Right;   ESOPHAGOGASTRODUODENOSCOPY (EGD) WITH ESOPHAGEAL DILATION N/A 07/16/2012   RMR: +Candida esophagitis, Erosive reflux esophagiits. Schatizi's ring status post dilation. Hiatal hernia. Antral erosions status post biopsy.    ESOPHAGOGASTRODUODENOSCOPY (EGD) WITH PROPOFOL N/A 12/08/2017   Dr. Gala Romney: esophagitis, query EOE but negative for  increased eosinophils on path, small hiatal hernia, normal stomach, normal duodenum, PAS stain with rare yeast forms on stain. Empiric dilation   ESOPHAGOGASTRODUODENOSCOPY (EGD) WITH PROPOFOL N/A 12/28/2020   esophagitis without bleeding, s/p biopsy and dilation. Antral erosions s/p biopsy.   ESOPHAGOGASTRODUODENOSCOPY (EGD) WITH PROPOFOL N/A 07/12/2021   Procedure: ESOPHAGOGASTRODUODENOSCOPY (EGD) WITH PROPOFOL;  Surgeon: [name], MD;  [facility];  Service: Endoscopy;  Laterality: N/A;  10:30am, asa 3   EYE SURGERY Bilateral 2018   cataract   HERNIA REPAIR     JOINT REPLACEMENT     KNEE SURGERY     Left knee arthroscopy torn medial meniscus grade 4 chondral changes medial femoral condyle tibial plateau   MALONEY DILATION N/A 12/08/2017   Procedure: MALONEY DILATION;  Surgeon: [name], MD;  [facility];  Service: Endoscopy;  Laterality: N/A;   MALONEY DILATION N/A 12/28/2020   Procedure: Venia Minks DILATION;  Surgeon: [name],  MD;  [facility];  Service: Endoscopy;  Laterality: N/A;   MALONEY DILATION N/A 07/12/2021   Procedure: Venia Minks DILATION;  Surgeon: [name], MD;  [facility];  Service: Endoscopy;  Laterality: N/A;   PATCH ANGIOPLASTY Right 10/08/2019   Procedure: PATCH ANGIOPLASTY of the right common carotid artery using hemashield plaltinum finesse patch;  Surgeon: Rosetta Posner, MD;  Location: [place] OR;  Service: Vascular;  Laterality: Right;   PERIPHERAL VASCULAR INTERVENTION  07/17/2020   Procedure: PERIPHERAL VASCULAR INTERVENTION;  Surgeon: Waynetta Sandy, MD;  Location: [place] [facility];  Service: Cardiovascular;;   POLYPECTOMY  12/08/2017   Procedure: POLYPECTOMY;  Surgeon: [name], MD;  [facility];  Service: Endoscopy;;  colon   TOTAL KNEE ARTHROPLASTY Left 04/27/2012   Procedure: TOTAL KNEE ARTHROPLASTY;  Surgeon: Carole Civil, MD;  Location: [facility];  Service:  Orthopedics;  Laterality: Left;   UMBILICAL HERNIA REPAIR  1993   VISCERAL ANGIOGRAPHY N/A 07/17/2020   Procedure: mesenteric ANGIOGRAPHY;  Surgeon: Waynetta Sandy, MD;  Location: [place] [facility];  Service: Cardiovascular;  Laterality: N/A;    Family History  Problem Relation Age of Onset   Dementia Mother    Hypertension Mother    Colon polyps Mother    Osteoporosis Mother    Cancer Father    Hypertension Father    Coronary artery disease Father        CABG in his 36's   Heart attack Father    Cancer - Lung Father    Heart disease Brother        has a pacemaker   Emphysema Maternal Grandmother    Cancer - Lung Maternal Grandfather    Cancer Paternal Grandmother    Heart attack Paternal Grandfather    Colon cancer Other        maternal great aunt   Colon cancer Other        paternal great aunt    Social History   Socioeconomic History   Marital status: Legally Separated    Spouse name: Not on file   Number of children: 5   Years of education: Not on file   Highest education level: Not on file  Occupational History   Occupation: Retail banker: [employer]  Tobacco Use   Smoking status: Every Day    Packs/day: 1.00    Years: 53.00    Total pack years: 53.00    Types: Cigarettes   Smokeless tobacco: Former    Types: Chew   Tobacco comments:    0.5 pk per day currently   Vaping Use   Vaping Use: Former  Substance and Sexual Activity   Alcohol use: Not Currently    Alcohol/week: 0.0 standard drinks of alcohol    Comment: socially "beer and tomato juice"    Drug use: No   Sexual activity: Yes    Birth control/protection: None  Other Topics Concern   Not on file  Social History Narrative   Lives alone.    Social Determinants of Health   Financial Resource Strain: Not on file  Food Insecurity: Not on file  Transportation Needs: Not on file  Physical Activity: Not on file  Stress: Not on file  Social Connections: Not on  file  Intimate Partner Violence: Not on file    Review of Systems  Constitutional:  Negative for chills, fever and malaise/fatigue.  Respiratory:  Positive for cough, shortness of  breath and wheezing. Negative for hemoptysis and sputum production.   Cardiovascular:  Negative for chest pain, palpitations, orthopnea and claudication.  Neurological:  Negative for dizziness, tingling, tremors and headaches.  Psychiatric/Behavioral: Negative.  Negative for depression, hallucinations, substance abuse and suicidal ideas.         Objective    BP 137/70 (BP Location: Right Arm, Patient Position: Sitting, Cuff Size: Normal)   Pulse 72   Ht 5' 7" (1.702 m)   Wt 150 lb (68 kg)   SpO2 96%   BMI 23.49 kg/m   Physical Exam Constitutional:      General: He is not in acute distress.    Appearance: He is normal weight. He is not ill-appearing, toxic-appearing or diaphoretic.  Cardiovascular:     Rate and Rhythm: Normal rate and regular rhythm.     Pulses: Normal pulses.     Heart sounds: Normal heart sounds.  Pulmonary:     Effort: Pulmonary effort is normal. No respiratory distress.     Breath sounds: No stridor. No wheezing, rhonchi or rales.     Comments: Has diminished lung sounds bilaterally Chest:     Chest wall: No tenderness.  Skin:    Capillary Refill: Capillary refill takes less than 2 seconds.  Neurological:     Mental Status: He is alert and oriented to person, place, and time.  Psychiatric:        Mood and Affect: Mood normal.        Behavior: Behavior normal.        Thought Content: Thought content normal.        Judgment: Judgment normal.         Assessment & Plan:   Problem List Items Addressed This Visit       Cardiovascular and Mediastinum   Essential hypertension, benign    BP Readings from Last 3 Encounters:  08/09/21 137/70  07/12/21 130/61  07/10/21 109/71  Chronic condition well-controlled on lisinopril-hydrochlorothiazide 20-12.5 mg 1 tablet  daily, metoprolol 25 mg twice daily. DASH diet advised need to take medication daily as prescribed discussed with patient CMP today      Relevant Orders   CMP14+EGFR   DM type 2 causing vascular disease ([place])    Chronic uncontrolled condition he has stopped taking novolog mix 70/30, metformin 1000 bid since about one month ago , has  CGM  reports CBG of 93, this morning he stated that his fasting blood sugar is usually between 150-300.   He initially refused going back to see Dr. Dorris Fetch, I did encourage patient to either follow-up with Dr. Dorris Fetch or be referred to another endocrinologist for management of his condition.  Patient decided to go back to see Dr. Dorris Fetch. Patient told to restart metformin 1000 mg twice daily, NovoLog 70/30 mix 30 units twice daily.  Patient told to call the office if he is blood sugar drops below 70. Has upcoming appointment for diabetic eye exam A1c ordered Urine creatinine labs ordered      Relevant Orders   HgB A1c   Microalbumin / creatinine urine ratio   CMP14+EGFR   AMB Referral to [facility]     Respiratory   COPD (chronic obstructive pulmonary disease) (HCC) - Primary    Chronic condition Reports wheezing shortness of breath Continues to smoke, smokes half a pack daily smoking cessation education completed Rx Trelegy 1 inhalation daily, albuterol inhaler 2 puffs every 6 hours as needed Low-dose chest CT ordered Follow-up in 4 months  Relevant Medications   Fluticasone-Umeclidin-Vilant (TRELEGY ELLIPTA) 100-62.5-25 MCG/ACT AEPB   albuterol (VENTOLIN HFA) 108 (90 Base) MCG/ACT inhaler   Other Relevant Orders   CT CHEST LUNG CA SCREEN LOW DOSE W/O CM   AMB Referral to [place]     Other   Mixed hyperlipidemia    Currently taking rosuvastatin 40 mg daily Check lipid panel       Relevant Orders   Lipid Profile   AMB Referral to [facility]   Current smoker    Smokes about 0.5  pack/day  Asked about quitting: confirms that he/she currently smokes cigarettes Advise to quit smoking: Educated about QUITTING to reduce the risk of cancer, cardio and cerebrovascular disease. Assess willingness: Unwilling to quit at this time, but is working on cutting back. Assist with counseling and pharmacotherapy: Counseled for 5 minutes and literature provided. Arrange for follow up: follow up in 4 months and continue to offer help.has over 50 years smoking history   Low-dose chest CT to screen for lung cancer ordered      Relevant Orders   CT CHEST LUNG CA SCREEN LOW DOSE W/O CM    Return in about 4 months (around 12/09/2021).   Renee Rival, FNP

## 2021-08-09 NOTE — Assessment & Plan Note (Addendum)
Chronic uncontrolled condition he has stopped taking novolog mix 70/30, metformin 1000 bid since about one month ago , has  CGM  reports CBG of 93, this morning he stated that his fasting blood sugar is usually between 150-300.   He initially refused going back to see Dr. Dorris Fetch, I did encourage patient to either follow-up with Dr. Dorris Fetch or be referred to another endocrinologist for management of his condition.  Patient decided to go back to see Dr. Dorris Fetch. Patient told to restart metformin 1000 mg twice daily, NovoLog 70/30 mix 30 units twice daily.  Patient told to call the office if he is blood sugar drops below 70. Has upcoming appointment for diabetic eye exam A1c ordered Urine creatinine labs ordered

## 2021-08-09 NOTE — Assessment & Plan Note (Signed)
Currently taking rosuvastatin 40 mg daily Check lipid panel

## 2021-08-09 NOTE — Assessment & Plan Note (Signed)
Smokes about 0.5 pack/day  Asked about quitting: confirms that he/she currently smokes cigarettes Advise to quit smoking: Educated about QUITTING to reduce the risk of cancer, cardio and cerebrovascular disease. Assess willingness: Unwilling to quit at this time, but is working on cutting back. Assist with counseling and pharmacotherapy: Counseled for 5 minutes and literature provided. Arrange for follow up: follow up in 4 months and continue to offer help.has over 50 years smoking history   Low-dose chest CT to screen for lung cancer ordered

## 2021-08-10 ENCOUNTER — Telehealth: Payer: Self-pay

## 2021-08-10 NOTE — Telephone Encounter (Signed)
Spoke with pt's wife Jimmy Duran advised ct lung appt 7/6 at 12 she verbalized understanding

## 2021-08-13 ENCOUNTER — Telehealth: Payer: Self-pay

## 2021-08-13 ENCOUNTER — Other Ambulatory Visit (HOSPITAL_COMMUNITY): Payer: Self-pay

## 2021-08-13 NOTE — Telephone Encounter (Signed)
Spoke with pt's wife regarding medication assistance for novolog.  Wife says they struggle with $30 monthly copay and they are interested in patient assistance for this medication. Agreed to have novo nordisk application mailed to their home.

## 2021-08-14 ENCOUNTER — Other Ambulatory Visit: Payer: Self-pay

## 2021-08-14 MED ORDER — ROSUVASTATIN CALCIUM 40 MG PO TABS: 40.0000 mg | ORAL_TABLET | Freq: Every day | ORAL | 3 refills | Status: DC

## 2021-08-16 ENCOUNTER — Other Ambulatory Visit: Payer: Self-pay | Admitting: *Deleted

## 2021-08-16 DIAGNOSIS — I6521 Occlusion and stenosis of right carotid artery: Secondary | ICD-10-CM

## 2021-08-22 ENCOUNTER — Encounter: Payer: Self-pay | Admitting: Vascular Surgery

## 2021-08-22 ENCOUNTER — Ambulatory Visit (INDEPENDENT_AMBULATORY_CARE_PROVIDER_SITE_OTHER): Payer: Medicare HMO | Admitting: Vascular Surgery

## 2021-08-22 ENCOUNTER — Ambulatory Visit (INDEPENDENT_AMBULATORY_CARE_PROVIDER_SITE_OTHER): Payer: Medicare HMO

## 2021-08-22 VITALS — BP 151/87 | HR 63 | Temp 97.3°F | Resp 18 | Ht 67.0 in | Wt 141.8 lb

## 2021-08-22 DIAGNOSIS — I6523 Occlusion and stenosis of bilateral carotid arteries: Secondary | ICD-10-CM

## 2021-08-22 DIAGNOSIS — K551 Chronic vascular disorders of intestine: Secondary | ICD-10-CM | POA: Diagnosis not present

## 2021-08-22 DIAGNOSIS — I6521 Occlusion and stenosis of right carotid artery: Secondary | ICD-10-CM

## 2021-08-22 NOTE — Progress Notes (Signed)
[facility] [place]  Patient name: Jimmy Duran MRN: [id number] DOB: 1955-07-14 Sex: male  REASON FOR VISIT: Follow-up carotid disease and mesenteric ischemia  HPI: Jimmy Duran is a 66 y.o. male here today for follow-up with his wife.  He underwent carotid endarterectomy for severe asymptomatic disease by myself in August 2021.  He does have known moderate to severe left carotid disease.  He is also status post SMA stent with Dr. Donzetta Matters on 07/17/2020.  He reports that he has had recent esophageal dilatation which is for recurrent stricture.  He does not have any symptoms of postprandial discomfort or mesenteric ischemia type symptoms.  He specifically denies any focal neurologic deficits.  Past Medical History:  Diagnosis Date   AAA (abdominal aortic aneurysm) (HCC)    Arthritis    Carotid artery disease (HCC)    Colon polyp    COPD (chronic obstructive pulmonary disease) (HCC)    Coronary artery disease    Nonobstructive   Essential hypertension    GERD (gastroesophageal reflux disease)    Hemorrhoids    History of TIA (transient ischemic attack)    Low back pain    Lumbar radiculopathy    Mixed hyperlipidemia    Palpitations    Peripheral vascular disease ([place])    Shingles 09/23/2019   Type 2 diabetes mellitus ([place])     Family History  Problem Relation Age of Onset   Dementia Mother    Hypertension Mother    Colon polyps Mother    Osteoporosis Mother    Cancer Father    Hypertension Father    Coronary artery disease Father        CABG in his 48's   Heart attack Father    Cancer - Lung Father    Heart disease Brother        has a pacemaker   Emphysema Maternal Grandmother    Cancer - Lung Maternal Grandfather    Cancer Paternal Grandmother    Heart attack Paternal Grandfather    Colon cancer Other        maternal great aunt   Colon cancer Other        paternal great aunt    SOCIAL  HISTORY: Social History   Tobacco Use   Smoking status: Every Day    Packs/day: 1.00    Years: 53.00    Total pack years: 53.00    Types: Cigarettes   Smokeless tobacco: Former    Types: Chew   Tobacco comments:    0.5 pk per day currently   Substance Use Topics   Alcohol use: Not Currently    Alcohol/week: 0.0 standard drinks of alcohol    Comment: socially "beer and tomato juice"     No Known Allergies  Current Outpatient Medications  Medication Sig Dispense Refill   albuterol (VENTOLIN HFA) 108 (90 Base) MCG/ACT inhaler Inhale 2 puffs into the lungs every 6 (six) hours as needed for wheezing or shortness of breath. 8 g 2   Aspirin-Caffeine (BC FAST PAIN RELIEF PO) Take 1 packet by mouth daily as needed (pain).     buPROPion (WELLBUTRIN SR) 150 MG 12 hr tablet Take 150 mg by mouth 2 (two) times daily.     clopidogrel (PLAVIX) 75 MG tablet Take 1 tablet (75 mg total) by mouth daily. 30 tablet 11   diphenhydrAMINE (BENADRYL) 25 MG tablet Take 25 mg by mouth daily as needed for allergies.      famotidine (  PEPCID) 20 MG tablet Take 1 tablet (20 mg total) by mouth 2 (two) times daily. 60 tablet 3   Fluticasone-Umeclidin-Vilant (TRELEGY ELLIPTA) 100-62.5-25 MCG/ACT AEPB Inhale 1 Inhalation into the lungs daily. 1 each 3   insulin aspart protamine - aspart (NOVOLOG MIX 70/30 FLEXPEN) (70-30) 100 UNIT/ML FlexPen Inject 30 Units into the skin 2 (two) times daily with a meal. 30 mL 2   lisinopril-hydrochlorothiazide (ZESTORETIC) 20-12.5 MG tablet Take 1 tablet by mouth once daily 90 tablet 3   metFORMIN (GLUCOPHAGE) 1000 MG tablet Take 1,000 mg by mouth 2 (two) times daily with a meal.     metoprolol tartrate (LOPRESSOR) 25 MG tablet Take 1 tablet by mouth twice daily 180 tablet 3   pantoprazole (PROTONIX) 40 MG tablet TAKE 1 TABLET BY MOUTH TWICE DAILY BEFORE A MEAL 60 tablet 5   rosuvastatin (CRESTOR) 40 MG tablet Take 1 tablet (40 mg total) by mouth daily. 90 tablet 3   No current  facility-administered medications for this visit.    REVIEW OF SYSTEMS:  '[X]'$  denotes positive finding, '[ ]'$  denotes negative finding Cardiac  Comments:  Chest pain or chest pressure:    Shortness of breath upon exertion:    Short of breath when lying flat:    Irregular heart rhythm:        Vascular    Pain in calf, thigh, or hip brought on by ambulation:    Pain in feet at night that wakes you up from your sleep:     Blood clot in your veins:    Leg swelling:           PHYSICAL EXAM: Vitals:   08/22/21 0840 08/22/21 0843  BP: (!) 150/84 (!) 151/87  Pulse: 63   Resp: 18   Temp: (!) 97.3 F (36.3 C)   TempSrc: Temporal   SpO2: 97%   Weight: 141 lb 12.8 oz (64.3 kg)   Height: '5\' 7"'$  (1.702 m)     GENERAL: The patient is a well-nourished male, in no acute distress. The vital signs are documented above. CARDIOVASCULAR: Right carotid incision is well-healed with no bruits bilaterally.  2+ radial pulses bilaterally. PULMONARY: There is good air exchange  MUSCULOSKELETAL: There are no major deformities or cyanosis. NEUROLOGIC: No focal weakness or paresthesias are detected. SKIN: There are no ulcers or rashes noted. PSYCHIATRIC: The patient has a normal affect.  DATA:  Carotid duplex today reveals widely patent endarterectomy on the right.  On the left he does have velocities suggestive of 60 to 79% stenosis.  He has extreme circumferential calcification making accurate estimate difficult.  MEDICAL ISSUES: Asymptomatic carotid disease.  Widely patent endarterectomy on the right internal carotid artery moderate to severe and possibly more severe disease on the left.  I have recommended CT angiogram of his neck for better definition of his degree of stenosis.  We will make recommendations at that time for possible continued observation versus right endarterectomy.    Rosetta Posner, MD FACS [facility] [phone]  Note: Portions  of this report may have been transcribed using voice recognition software.  Every effort has been made to ensure accuracy; however, inadvertent computerized transcription errors may still be present.

## 2021-08-27 ENCOUNTER — Other Ambulatory Visit: Payer: Self-pay

## 2021-08-27 DIAGNOSIS — I6523 Occlusion and stenosis of bilateral carotid arteries: Secondary | ICD-10-CM

## 2021-09-06 ENCOUNTER — Telehealth: Payer: Self-pay

## 2021-09-06 ENCOUNTER — Ambulatory Visit (HOSPITAL_COMMUNITY)
Admission: RE | Admit: 2021-09-06 | Discharge: 2021-09-06 | Disposition: A | Discharge status: Home / Self Care | Payer: Medicare HMO | Source: Ambulatory Visit | Attending: Nurse Practitioner | Admitting: Nurse Practitioner

## 2021-09-06 DIAGNOSIS — F17210 Nicotine dependence, cigarettes, uncomplicated: Secondary | ICD-10-CM | POA: Insufficient documentation

## 2021-09-06 DIAGNOSIS — Z122 Encounter for screening for malignant neoplasm of respiratory organs: Secondary | ICD-10-CM | POA: Insufficient documentation

## 2021-09-06 DIAGNOSIS — F17200 Nicotine dependence, unspecified, uncomplicated: Secondary | ICD-10-CM | POA: Diagnosis present

## 2021-09-06 DIAGNOSIS — Z79899 Other long term (current) drug therapy: Secondary | ICD-10-CM | POA: Insufficient documentation

## 2021-09-06 DIAGNOSIS — J449 Chronic obstructive pulmonary disease, unspecified: Secondary | ICD-10-CM | POA: Insufficient documentation

## 2021-09-06 DIAGNOSIS — J439 Emphysema, unspecified: Secondary | ICD-10-CM | POA: Diagnosis not present

## 2021-09-06 NOTE — Telephone Encounter (Signed)
Jimmy Duran has talked to them

## 2021-09-06 NOTE — Progress Notes (Signed)
No pulmonary nodules, continue current medications , avoid cigarettes smoking

## 2021-09-06 NOTE — Telephone Encounter (Signed)
[facility] called in about this pt who is at their office right now. They needed some info from the provider, they have sent a secure chat message to the provider and have not gotten a response. Please advise.  Please Call [facility].

## 2021-09-10 ENCOUNTER — Ambulatory Visit (HOSPITAL_COMMUNITY)
Admission: RE | Admit: 2021-09-10 | Discharge: 2021-09-10 | Disposition: A | Discharge status: Home / Self Care | Payer: Medicare HMO | Source: Ambulatory Visit | Attending: Vascular Surgery | Admitting: Vascular Surgery

## 2021-09-10 DIAGNOSIS — I6523 Occlusion and stenosis of bilateral carotid arteries: Secondary | ICD-10-CM | POA: Diagnosis not present

## 2021-09-10 MED ORDER — IOHEXOL 350 MG/ML SOLN
75.0000 mL | Freq: Once | INTRAVENOUS | Status: AC | PRN
  Administered 2021-09-10: 75 mL via INTRAVENOUS

## 2021-09-11 ENCOUNTER — Other Ambulatory Visit: Payer: Self-pay | Admitting: Nurse Practitioner

## 2021-09-11 DIAGNOSIS — E1159 Type 2 diabetes mellitus with other circulatory complications: Secondary | ICD-10-CM

## 2021-09-11 LAB — POCT I-STAT CREATININE: Creatinine, Ser: 1.2 mg/dL (ref 0.61–1.24)

## 2021-09-11 MED ORDER — NOVOLOG MIX 70/30 FLEXPEN (70-30) 100 UNIT/ML ~~LOC~~ SUPN: 30.0000 [IU] | PEN_INJECTOR | Freq: Two times a day (BID) | SUBCUTANEOUS | 2 refills | Status: DC

## 2021-09-11 MED ORDER — METFORMIN HCL 1000 MG PO TABS: 1000.0000 mg | ORAL_TABLET | Freq: Two times a day (BID) | ORAL | 3 refills | Status: DC

## 2021-09-11 NOTE — Progress Notes (Signed)
Continue to take current medications and follow-up with Dr. Dorris Fetch for his uncontrolled diabetes

## 2021-09-11 NOTE — Progress Notes (Signed)
Diabetes is uncontrolled.  Patient should make sure to follow-up with Dr. Rebecca Eaton office to avoid risk of MI, stroke, kidney damage.  Triglyceride is high, but LDL is within normal range.  Patient should avoid fatty fried foods, alcohol , simple carbohydrates

## 2021-09-12 ENCOUNTER — Ambulatory Visit (INDEPENDENT_AMBULATORY_CARE_PROVIDER_SITE_OTHER): Payer: Medicare HMO | Admitting: Vascular Surgery

## 2021-09-12 ENCOUNTER — Encounter: Payer: Self-pay | Admitting: Vascular Surgery

## 2021-09-12 VITALS — BP 119/74 | HR 68 | Temp 97.2°F | Ht 67.0 in | Wt 143.2 lb

## 2021-09-12 DIAGNOSIS — I6523 Occlusion and stenosis of bilateral carotid arteries: Secondary | ICD-10-CM | POA: Diagnosis not present

## 2021-09-12 LAB — LIPID PANEL
Chol/HDL Ratio: 2.8 ratio (ref 0.0–5.0)
Cholesterol, Total: 96 mg/dL — ABNORMAL LOW (ref 100–199)
HDL: 34 mg/dL — ABNORMAL LOW (ref >39)
LDL Chol Calc (NIH): 23 mg/dL (ref 0–99)
Triglycerides: 264 mg/dL — ABNORMAL HIGH (ref 0–149)
VLDL Cholesterol Cal: 39 mg/dL (ref 5–40)

## 2021-09-12 LAB — CMP14+EGFR
ALT: 10 IU/L (ref 0–44)
AST: 14 IU/L (ref 0–40)
Albumin/Globulin Ratio: 2.4 — ABNORMAL HIGH (ref 1.2–2.2)
Albumin: 4.5 g/dL (ref 3.9–4.9)
Alkaline Phosphatase: 82 IU/L (ref 44–121)
BUN/Creatinine Ratio: 14 (ref 10–24)
BUN: 15 mg/dL (ref 8–27)
Bilirubin Total: 0.2 mg/dL (ref 0.0–1.2)
CO2: 23 mmol/L (ref 20–29)
Calcium: 9.1 mg/dL (ref 8.6–10.2)
Chloride: 101 mmol/L (ref 96–106)
Creatinine, Ser: 1.1 mg/dL (ref 0.76–1.27)
Globulin, Total: 1.9 g/dL (ref 1.5–4.5)
Glucose: 184 mg/dL — ABNORMAL HIGH (ref 70–99)
Potassium: 4.8 mmol/L (ref 3.5–5.2)
Sodium: 136 mmol/L (ref 134–144)
Total Protein: 6.4 g/dL (ref 6.0–8.5)
eGFR: 75 mL/min/{1.73_m2} (ref >59)

## 2021-09-12 LAB — HEMOGLOBIN A1C
Est. average glucose Bld gHb Est-mCnc: 194 mg/dL
Hgb A1c MFr Bld: 8.4 % — ABNORMAL HIGH (ref 4.8–5.6)

## 2021-09-12 LAB — MICROALBUMIN / CREATININE URINE RATIO
Creatinine, Urine: 32.2 mg/dL
Microalb/Creat Ratio: 35 mg/g creat — ABNORMAL HIGH (ref 0–29)
Microalbumin, Urine: 11.2 ug/mL

## 2021-09-12 NOTE — Progress Notes (Signed)
Vascular and Vein Specialist of [facility]  Patient name: Jimmy Duran MRN: [id number] DOB: [birth year] Sex: male  REASON FOR VISIT: Continued follow-up left internal carotid artery stenosis  HPI: Jimmy Duran is a 66 y.o. male here today for follow-up and discussion of recent CT scan.  He is here today with his wife.  He remains asymptomatic from carotid disease  Past Medical History:  Diagnosis Date   AAA (abdominal aortic aneurysm) ([place])    Arthritis    Carotid artery disease (HCC)    Colon polyp    COPD (chronic obstructive pulmonary disease) (HCC)    Coronary artery disease    Nonobstructive   Essential hypertension    GERD (gastroesophageal reflux disease)    Hemorrhoids    History of TIA (transient ischemic attack)    Low back pain    Lumbar radiculopathy    Mixed hyperlipidemia    Palpitations    Peripheral vascular disease ([place])    Shingles 09/23/2019   Type 2 diabetes mellitus ([place])     Family History  Problem Relation Age of Onset   Dementia Mother    Hypertension Mother    Colon polyps Mother    Osteoporosis Mother    Cancer Father    Hypertension Father    Coronary artery disease Father        CABG in his 19's   Heart attack Father    Cancer - Lung Father    Heart disease Brother        has a pacemaker   Emphysema Maternal Grandmother    Cancer - Lung Maternal Grandfather    Cancer Paternal Grandmother    Heart attack Paternal Grandfather    Colon cancer Other        maternal great aunt   Colon cancer Other        paternal great aunt    SOCIAL HISTORY: Social History   Tobacco Use   Smoking status: Every Day    Packs/day: 1.00    Years: 53.00    Total pack years: 53.00    Types: Cigarettes   Smokeless tobacco: Former    Types: Chew   Tobacco comments:    0.5 pk per day currently   Substance Use Topics   Alcohol use: Not Currently    Alcohol/week: 0.0 standard drinks of alcohol    Comment:  socially "beer and tomato juice"     No Known Allergies  Current Outpatient Medications  Medication Sig Dispense Refill   albuterol (VENTOLIN HFA) 108 (90 Base) MCG/ACT inhaler Inhale 2 puffs into the lungs every 6 (six) hours as needed for wheezing or shortness of breath. 8 g 2   Aspirin-Caffeine (BC FAST PAIN RELIEF PO) Take 1 packet by mouth daily as needed (pain).     buPROPion (WELLBUTRIN SR) 150 MG 12 hr tablet Take 150 mg by mouth 2 (two) times daily.     clopidogrel (PLAVIX) 75 MG tablet Take 1 tablet (75 mg total) by mouth daily. 30 tablet 11   diphenhydrAMINE (BENADRYL) 25 MG tablet Take 25 mg by mouth daily as needed for allergies.      famotidine (PEPCID) 20 MG tablet Take 1 tablet (20 mg total) by mouth 2 (two) times daily. 60 tablet 3   Fluticasone-Umeclidin-Vilant (TRELEGY ELLIPTA) 100-62.5-25 MCG/ACT AEPB Inhale 1 Inhalation into the lungs daily. 1 each 3   insulin aspart protamine - aspart (NOVOLOG MIX 70/30 FLEXPEN) (70-30) 100 UNIT/ML FlexPen Inject 30 Units into the  skin 2 (two) times daily with a meal. 30 mL 2   lisinopril-hydrochlorothiazide (ZESTORETIC) 20-12.5 MG tablet Take 1 tablet by mouth once daily 90 tablet 3   metFORMIN (GLUCOPHAGE) 1000 MG tablet Take 1 tablet (1,000 mg total) by mouth 2 (two) times daily with a meal. 60 tablet 3   metoprolol tartrate (LOPRESSOR) 25 MG tablet Take 1 tablet by mouth twice daily 180 tablet 3   pantoprazole (PROTONIX) 40 MG tablet TAKE 1 TABLET BY MOUTH TWICE DAILY BEFORE A MEAL 60 tablet 5   rosuvastatin (CRESTOR) 40 MG tablet Take 1 tablet (40 mg total) by mouth daily. 90 tablet 3   No current facility-administered medications for this visit.    REVIEW OF SYSTEMS:  '[X]'$  denotes positive finding, '[ ]'$  denotes negative finding Cardiac  Comments:  Chest pain or chest pressure:    Shortness of breath upon exertion:    Short of breath when lying flat:    Irregular heart rhythm:        Vascular    Pain in calf, thigh, or hip  brought on by ambulation:    Pain in feet at night that wakes you up from your sleep:     Blood clot in your veins:    Leg swelling:           PHYSICAL EXAM: Vitals:   09/12/21 0903  BP: 119/74  Pulse: 68  Temp: (!) 97.2 F (36.2 C)  TempSrc: Temporal  SpO2: 98%  Weight: 143 lb 3.2 oz (65 kg)  Height: '5\' 7"'$  (1.702 m)    GENERAL: The patient is a well-nourished male, in no acute distress. The vital signs are documented above.    MEDICAL ISSUES: I reviewed his CT scan from 09/10/2021 with the patient.  This shows wide patency of his endarterectomy done by myself in August 2021.  He has near occlusion of his internal carotid artery at the bifurcation.  He also has some extension of weblike plaque or dissection in the internal carotid above the bifurcation but below the skull base.  He also has bilateral vertebral artery origin occlusive disease.  I reviewed his films with Dr. Donzetta Matters.  Due to the area of stenosis above the bifurcation, feel that his best treatment option would be TCAR and Dr. Donzetta Matters concurs.  I described the procedure in detail with the patient including potential risk for stroke.  He wishes to proceed electively.  Let him know that our [place] office will coordinate this.  I offered a office visit or telephone conversation with Dr. Donzetta Matters and he feels comfortable proceeding after our discussion alone.  He will continue his Plavix through the procedure    Rosetta Posner, MD [facility] Vascular and Vein Specialists of [facility] [phone]  Note: Portions of this report may have been transcribed using voice recognition software.  Every effort has been made to ensure accuracy; however, inadvertent computerized transcription errors may still be present.

## 2021-09-12 NOTE — H&P (View-Only) (Signed)
Vascular and Vein Specialist of [place]  Patient name: Jimmy Duran MRN: [id number] DOB: 1955-03-25 Sex: male  REASON FOR VISIT: Continued follow-up left internal carotid artery stenosis  HPI: Jimmy Duran is a 66 y.o. male here today for follow-up and discussion of recent CT scan.  He is here today with his wife.  He remains asymptomatic from carotid disease  Past Medical History:  Diagnosis Date   AAA (abdominal aortic aneurysm) ([place])    Arthritis    Carotid artery disease (HCC)    Colon polyp    COPD (chronic obstructive pulmonary disease) (HCC)    Coronary artery disease    Nonobstructive   Essential hypertension    GERD (gastroesophageal reflux disease)    Hemorrhoids    History of TIA (transient ischemic attack)    Low back pain    Lumbar radiculopathy    Mixed hyperlipidemia    Palpitations    Peripheral vascular disease ([place])    Shingles 09/23/2019   Type 2 diabetes mellitus ([place])     Family History  Problem Relation Age of Onset   Dementia Mother    Hypertension Mother    Colon polyps Mother    Osteoporosis Mother    Cancer Father    Hypertension Father    Coronary artery disease Father        CABG in his 52's   Heart attack Father    Cancer - Lung Father    Heart disease Brother        has a pacemaker   Emphysema Maternal Grandmother    Cancer - Lung Maternal Grandfather    Cancer Paternal Grandmother    Heart attack Paternal Grandfather    Colon cancer Other        maternal great aunt   Colon cancer Other        paternal great aunt    SOCIAL HISTORY: Social History   Tobacco Use   Smoking status: Every Day    Packs/day: 1.00    Years: 53.00    Total pack years: 53.00    Types: Cigarettes   Smokeless tobacco: Former    Types: Chew   Tobacco comments:    0.5 pk per day currently   Substance Use Topics   Alcohol use: Not Currently    Alcohol/week: 0.0 standard drinks of alcohol    Comment:  socially "beer and tomato juice"     No Known Allergies  Current Outpatient Medications  Medication Sig Dispense Refill   albuterol (VENTOLIN HFA) 108 (90 Base) MCG/ACT inhaler Inhale 2 puffs into the lungs every 6 (six) hours as needed for wheezing or shortness of breath. 8 g 2   Aspirin-Caffeine (BC FAST PAIN RELIEF PO) Take 1 packet by mouth daily as needed (pain).     buPROPion (WELLBUTRIN SR) 150 MG 12 hr tablet Take 150 mg by mouth 2 (two) times daily.     clopidogrel (PLAVIX) 75 MG tablet Take 1 tablet (75 mg total) by mouth daily. 30 tablet 11   diphenhydrAMINE (BENADRYL) 25 MG tablet Take 25 mg by mouth daily as needed for allergies.      famotidine (PEPCID) 20 MG tablet Take 1 tablet (20 mg total) by mouth 2 (two) times daily. 60 tablet 3   Fluticasone-Umeclidin-Vilant (TRELEGY ELLIPTA) 100-62.5-25 MCG/ACT AEPB Inhale 1 Inhalation into the lungs daily. 1 each 3   insulin aspart protamine - aspart (NOVOLOG MIX 70/30 FLEXPEN) (70-30) 100 UNIT/ML FlexPen Inject 30 Units into the  skin 2 (two) times daily with a meal. 30 mL 2   lisinopril-hydrochlorothiazide (ZESTORETIC) 20-12.5 MG tablet Take 1 tablet by mouth once daily 90 tablet 3   metFORMIN (GLUCOPHAGE) 1000 MG tablet Take 1 tablet (1,000 mg total) by mouth 2 (two) times daily with a meal. 60 tablet 3   metoprolol tartrate (LOPRESSOR) 25 MG tablet Take 1 tablet by mouth twice daily 180 tablet 3   pantoprazole (PROTONIX) 40 MG tablet TAKE 1 TABLET BY MOUTH TWICE DAILY BEFORE A MEAL 60 tablet 5   rosuvastatin (CRESTOR) 40 MG tablet Take 1 tablet (40 mg total) by mouth daily. 90 tablet 3   No current facility-administered medications for this visit.    REVIEW OF SYSTEMS:  '[X]'$  denotes positive finding, '[ ]'$  denotes negative finding Cardiac  Comments:  Chest pain or chest pressure:    Shortness of breath upon exertion:    Short of breath when lying flat:    Irregular heart rhythm:        Vascular    Pain in calf, thigh, or hip  brought on by ambulation:    Pain in feet at night that wakes you up from your sleep:     Blood clot in your veins:    Leg swelling:           PHYSICAL EXAM: Vitals:   09/12/21 0903  BP: 119/74  Pulse: 68  Temp: (!) 97.2 F (36.2 C)  TempSrc: Temporal  SpO2: 98%  Weight: 143 lb 3.2 oz (65 kg)  Height: '5\' 7"'$  (1.702 m)    GENERAL: The patient is a well-nourished male, in no acute distress. The vital signs are documented above.    MEDICAL ISSUES: I reviewed his CT scan from 09/10/2021 with the patient.  This shows wide patency of his endarterectomy done by myself in August 2021.  He has near occlusion of his internal carotid artery at the bifurcation.  He also has some extension of weblike plaque or dissection in the internal carotid above the bifurcation but below the skull base.  He also has bilateral vertebral artery origin occlusive disease.  I reviewed his films with Dr. Donzetta Matters.  Due to the area of stenosis above the bifurcation, feel that his best treatment option would be TCAR and Dr. Donzetta Matters concurs.  I described the procedure in detail with the patient including potential risk for stroke.  He wishes to proceed electively.  Let him know that our [place] office will coordinate this.  I offered a office visit or telephone conversation with Dr. Donzetta Matters and he feels comfortable proceeding after our discussion alone.  He will continue his Plavix through the procedure    Rosetta Posner, MD [facility] Vascular and Vein Specialists of [facility] [phone]  Note: Portions of this report may have been transcribed using voice recognition software.  Every effort has been made to ensure accuracy; however, inadvertent computerized transcription errors may still be present.

## 2021-09-14 ENCOUNTER — Other Ambulatory Visit: Payer: Self-pay

## 2021-09-14 DIAGNOSIS — I6523 Occlusion and stenosis of bilateral carotid arteries: Secondary | ICD-10-CM

## 2021-10-03 NOTE — Pre-Procedure Instructions (Signed)
Surgical Instructions    Your procedure is scheduled on October 12, 2021.  Report to [facility] Main Entrance "A" at 5:30 A.M., then check in with the Admitting office.  Call this number if you have problems the morning of surgery:  [phone]   If you have any questions prior to your surgery date call [phone]: Open Monday-Friday 8am-4pm    Remember:  Do not eat or drink after midnight the night before your surgery    Take these medicines the morning of surgery with A SIP OF WATER:  buPROPion (WELLBUTRIN SR)   famotidine (PEPCID)   metoprolol tartrate (LOPRESSOR)  rosuvastatin (CRESTOR)   clopidogrel (PLAVIX)    Take these medicines the morning of surgery AS NEEDED:  albuterol (VENTOLIN HFA) - bring inhaler with you to hospital  diphenhydrAMINE (BENADRYL)      As of today, STOP taking any Aleve, Naproxen, Ibuprofen, Motrin, Advil, Goody's, BC's, all herbal medications, fish oil, and all vitamins.                    WHAT DO I DO ABOUT MY DIABETES MEDICATION?   Do not take metFORMIN (GLUCOPHAGE) the morning of surgery.  THE NIGHT BEFORE SURGERY, take 12.5 units of insulin NPH Human (NOVOLIN N)       THE MORNING OF SURGERY, take 12.5 units of insulin NPH Human (NOVOLIN N)   The day of surgery, do not take other diabetes injectables, including Byetta (exenatide), Bydureon (exenatide ER), Victoza (liraglutide), or Trulicity (dulaglutide).    HOW TO MANAGE YOUR DIABETES BEFORE AND AFTER SURGERY  Why is it important to control my blood sugar before and after surgery? Improving blood sugar levels before and after surgery helps healing and can limit problems. A way of improving blood sugar control is eating a healthy diet by:  Eating less sugar and carbohydrates  Increasing activity/exercise  Talking with your doctor about reaching your blood sugar goals High blood sugars (greater than 180 mg/dL) can raise your risk of infections and slow your recovery, so you will  need to focus on controlling your diabetes during the weeks before surgery. Make sure that the doctor who takes care of your diabetes knows about your planned surgery including the date and location.  How do I manage my blood sugar before surgery? Check your blood sugar at least 4 times a day, starting 2 days before surgery, to make sure that the level is not too high or low.  Check your blood sugar the morning of your surgery when you wake up and every 2 hours until you get to the Short Stay unit.  If your blood sugar is less than 70 mg/dL, you will need to treat for low blood sugar: Do not take insulin. Treat a low blood sugar (less than 70 mg/dL) with  cup of clear juice (cranberry or apple), 4 glucose tablets, OR glucose gel. Recheck blood sugar in 15 minutes after treatment (to make sure it is greater than 70 mg/dL). If your blood sugar is not greater than 70 mg/dL on recheck, call [phone] for further instructions. Report your blood sugar to the short stay nurse when you get to Short Stay.  If you are admitted to the hospital after surgery: Your blood sugar will be checked by the staff and you will probably be given insulin after surgery (instead of oral diabetes medicines) to make sure you have good blood sugar levels. The goal for blood sugar control after surgery is 80-180 mg/dL.  Do NOT Smoke (Tobacco/Vaping) for 24 hours prior to your procedure.  If you use a CPAP at night, you may bring your mask/headgear for your overnight stay.   Contacts, glasses, piercing's, hearing aid's, dentures or partials may not be worn into surgery, please bring cases for these belongings.    For patients admitted to the hospital, discharge time will be determined by your treatment team.   Patients discharged the day of surgery will not be allowed to drive home, and someone needs to stay with them for 24 hours.  SURGICAL WAITING ROOM VISITATION Patients having surgery or a procedure may have  no more than 2 support people in the waiting area - these visitors may rotate.   Children under the age of 9 must have an adult with them who is not the patient. If the patient needs to stay at the hospital during part of their recovery, the visitor guidelines for inpatient rooms apply. Pre-op nurse will coordinate an appropriate time for 1 support person to accompany patient in pre-op.  This support person may not rotate.   Please refer to the [facility] website for the visitor guidelines for Inpatients (after your surgery is over and you are in a regular room).    Special instructions:   [place]- Preparing For Surgery  Before surgery, you can play an important role. Because skin is not sterile, your skin needs to be as free of germs as possible. You can reduce the number of germs on your skin by washing with CHG (chlorahexidine gluconate) Soap before surgery.  CHG is an antiseptic cleaner which kills germs and bonds with the skin to continue killing germs even after washing.    Oral Hygiene is also important to reduce your risk of infection.  Remember - BRUSH YOUR TEETH THE MORNING OF SURGERY WITH YOUR REGULAR TOOTHPASTE  Please do not use if you have an allergy to CHG or antibacterial soaps. If your skin becomes reddened/irritated stop using the CHG.  Do not shave (including legs and underarms) for at least 48 hours prior to first CHG shower. It is OK to shave your face.  Please follow these instructions carefully.   Shower the NIGHT BEFORE SURGERY and the MORNING OF SURGERY  If you chose to wash your hair, wash your hair first as usual with your normal shampoo.  After you shampoo, rinse your hair and body thoroughly to remove the shampoo.  Use CHG Soap as you would any other liquid soap. You can apply CHG directly to the skin and wash gently with a scrungie or a clean washcloth.   Apply the CHG Soap to your body ONLY FROM THE NECK DOWN.  Do not use on open wounds or open sores.  Avoid contact with your eyes, ears, mouth and genitals (private parts). Wash Face and genitals (private parts)  with your normal soap.   Wash thoroughly, paying special attention to the area where your surgery will be performed.  Thoroughly rinse your body with warm water from the neck down.  DO NOT shower/wash with your normal soap after using and rinsing off the CHG Soap.  Pat yourself dry with a CLEAN TOWEL.  Wear CLEAN PAJAMAS to bed the night before surgery  Place CLEAN SHEETS on your bed the night before your surgery  DO NOT SLEEP WITH PETS.   Day of Surgery: Take a shower with CHG soap. Do not wear jewelry or makeup Do not wear lotions, powders, perfumes/colognes, or deodorant. Do not shave 48  hours prior to surgery.  Men may shave face and neck. Do not bring valuables to the hospital.  [facility] is not responsible for any belongings or valuables. Do not wear nail polish, gel polish, artificial nails, or any other type of covering on natural nails (fingers and toes) If you have artificial nails or gel coating that need to be removed by a nail salon, please have this removed prior to surgery. Artificial nails or gel coating may interfere with anesthesia's ability to adequately monitor your vital signs.  Wear Clean/Comfortable clothing the morning of surgery Remember to brush your teeth WITH YOUR REGULAR TOOTHPASTE.   Please read over the following fact sheets that you were given.    If you received a COVID test during your pre-op visit  it is requested that you wear a mask when out in public, stay away from anyone that may not be feeling well and notify your surgeon if you develop symptoms. If you have been in contact with anyone that has tested positive in the last 10 days please notify you surgeon.

## 2021-10-04 ENCOUNTER — Other Ambulatory Visit: Payer: Self-pay

## 2021-10-04 ENCOUNTER — Encounter (HOSPITAL_COMMUNITY)
Admission: RE | Admit: 2021-10-04 | Discharge: 2021-10-04 | Disposition: A | Discharge status: Home / Self Care | Payer: Medicare HMO | Source: Ambulatory Visit | Attending: Vascular Surgery | Admitting: Vascular Surgery

## 2021-10-04 ENCOUNTER — Encounter (HOSPITAL_COMMUNITY): Payer: Self-pay

## 2021-10-04 VITALS — BP 100/70 | Temp 98.2°F | Resp 17 | Ht 67.0 in | Wt 142.0 lb

## 2021-10-04 DIAGNOSIS — I6523 Occlusion and stenosis of bilateral carotid arteries: Secondary | ICD-10-CM | POA: Insufficient documentation

## 2021-10-04 DIAGNOSIS — I2510 Atherosclerotic heart disease of native coronary artery without angina pectoris: Secondary | ICD-10-CM | POA: Insufficient documentation

## 2021-10-04 DIAGNOSIS — E785 Hyperlipidemia, unspecified: Secondary | ICD-10-CM | POA: Insufficient documentation

## 2021-10-04 DIAGNOSIS — I10 Essential (primary) hypertension: Secondary | ICD-10-CM | POA: Diagnosis not present

## 2021-10-04 DIAGNOSIS — Z01818 Encounter for other preprocedural examination: Secondary | ICD-10-CM | POA: Insufficient documentation

## 2021-10-04 LAB — COMPREHENSIVE METABOLIC PANEL
ALT: 13 U/L (ref 0–44)
AST: 16 U/L (ref 15–41)
Albumin: 4.1 g/dL (ref 3.5–5.0)
Alkaline Phosphatase: 74 U/L (ref 38–126)
Anion gap: 10 (ref 5–15)
BUN: 21 mg/dL (ref 8–23)
CO2: 25 mmol/L (ref 22–32)
Calcium: 9.6 mg/dL (ref 8.9–10.3)
Chloride: 100 mmol/L (ref 98–111)
Creatinine, Ser: 1.41 mg/dL — ABNORMAL HIGH (ref 0.61–1.24)
GFR, Estimated: 56 mL/min — ABNORMAL LOW (ref >60)
Glucose, Bld: 299 mg/dL — ABNORMAL HIGH (ref 70–99)
Potassium: 3.9 mmol/L (ref 3.5–5.1)
Sodium: 135 mmol/L (ref 135–145)
Total Bilirubin: 0.4 mg/dL (ref 0.3–1.2)
Total Protein: 7 g/dL (ref 6.5–8.1)

## 2021-10-04 LAB — CBC
HCT: 40.6 % (ref 39.0–52.0)
Hemoglobin: 14.2 g/dL (ref 13.0–17.0)
MCH: 32.3 pg (ref 26.0–34.0)
MCHC: 35 g/dL (ref 30.0–36.0)
MCV: 92.3 fL (ref 80.0–100.0)
Platelets: 307 10*3/uL (ref 150–400)
RBC: 4.4 MIL/uL (ref 4.22–5.81)
RDW: 13.2 % (ref 11.5–15.5)
WBC: 7.8 10*3/uL (ref 4.0–10.5)
nRBC: 0 % (ref 0.0–0.2)

## 2021-10-04 LAB — URINALYSIS, ROUTINE W REFLEX MICROSCOPIC
Bacteria, UA: NONE SEEN
Bilirubin Urine: NEGATIVE
Glucose, UA: >=500 mg/dL — AB
Hgb urine dipstick: NEGATIVE
Ketones, ur: NEGATIVE mg/dL
Leukocytes,Ua: NEGATIVE
Nitrite: NEGATIVE
Protein, ur: NEGATIVE mg/dL
Specific Gravity, Urine: 1.015 (ref 1.005–1.030)
pH: 5 (ref 5.0–8.0)

## 2021-10-04 LAB — PROTIME-INR
INR: 1 (ref 0.8–1.2)
Prothrombin Time: 13.5 seconds (ref 11.4–15.2)

## 2021-10-04 LAB — SURGICAL PCR SCREEN
MRSA, PCR: NEGATIVE
Staphylococcus aureus: POSITIVE — AB

## 2021-10-04 LAB — TYPE AND SCREEN
ABO/RH(D): O POS
Antibody Screen: NEGATIVE

## 2021-10-04 LAB — GLUCOSE, CAPILLARY: Glucose-Capillary: 294 mg/dL — ABNORMAL HIGH (ref 70–99)

## 2021-10-04 LAB — APTT: aPTT: 29 seconds (ref 24–36)

## 2021-10-04 NOTE — Progress Notes (Signed)
PCP - Vena Rua, FNP Cardiologist - Dr. [name]  PPM/ICD - n/a  Chest x-ray - n/a EKG - 10/04/21 Stress Test - 04/23/13 ECHO - 12/11/18 Cardiac Cath - 1990's-done in [place]. No stents needed. Pt f/u with cardiology regularly.   Sleep Study - denies CPAP - denies  Fasting Blood Sugar - 180-250's as of late has been in the 300's. Last A1C 8.4 on 09/10/21 Checks Blood Sugar 5-6 times a day with Dexcom to left upper arm. CBG at PAT 294.   Blood Thinner Instructions: PLavix, continue per provider. Aspirin Instructions: n/a  NPO  COVID TEST- n/a  Anesthesia review: Yes, hx of CAD. A1C 8.4  Patient denies shortness of breath, fever, cough and chest pain at PAT appointment   All instructions explained to the patient, with a verbal understanding of the material. Patient agrees to go over the instructions while at home for a better understanding. Patient also instructed to self quarantine after being tested for COVID-19. The opportunity to ask questions was provided.

## 2021-10-05 NOTE — Progress Notes (Signed)
Anesthesia Chart Review:  [facility] cardiology for history of nonobstructive CAD, HLD, HTN.  Last seen by Dr. Domenic Polite 04/19/2021.  Doing well at that time, no changes to management, recommended follow-up in 1 year.  Follows with vascular surgery for history of PAD.  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  Last seen by Dr. Donnetta Hutching 09/12/2021.  It was noted that recent CT scan 09/10/2021 showed wide patency of right CEA, however he had near occlusion of his left internal carotid artery at the bifurcation and was recommended to undergo TCAR.  Per protocol he will continue Plavix through the procedure.  COPD, maintained on Trelegy and as needed albuterol.  IDDM 2, uncontrolled, last A1c 8.4 on 09/10/2021.  Preop labs reviewed, creatinine mildly elevated 1.41, glucose elevated at 299 consistent with uncontrolled IDDM 2, labs otherwise unremarkable.  EKG 10/04/2021: NSR.  Rate 79.  CTA neck 09/10/2021: IMPRESSION: 1. Severe, near occlusive, stenosis of the left ICA. 2. Typical changes of right carotid endarterectomy without residual or recurrent stenosis. 3. High-grade stenoses at the origin of the vertebral arteries bilaterally. The left vertebral artery is dominant. 4. Chronic endplate degenerative changes and focal reversal of the normal cervical lordosis at C4-5 and C5-6. 5. Aortic Atherosclerosis (ICD10-I70.0) and Emphysema (ICD10-J43.9).   TTE 12/11/2018:  1. Left ventricular ejection fraction, by visual estimation, is 60 to  65%. The left ventricle has normal function. There is moderately increased  left ventricular hypertrophy.   2. Left ventricular diastolic Doppler parameters are consistent with  impaired relaxation pattern of LV diastolic filling.   3. Global right ventricle has low normal systolic function.The right  ventricular size is normal. No increase in right ventricular wall   thickness.   4. Left atrial size was normal.   5. Right atrial size was normal.   6. Mild mitral annular calcification.   7. Mild to moderate aortic valve annular calcification.   8. The mitral valve is degenerative. No evidence of mitral valve  regurgitation.   9. The tricuspid valve is grossly normal. Tricuspid valve regurgitation  was not visualized by color flow Doppler.  10. The aortic valve is tricuspid Aortic valve regurgitation was not  visualized by color flow Doppler. Mild aortic valve sclerosis without  stenosis.  11. The pulmonic valve was not well visualized. Pulmonic valve  regurgitation is not visualized by color flow Doppler.  12. The inferior vena cava is normal in size with greater than 50%  respiratory variability, suggesting right atrial pressure of 3 mmHg.   Nuclear stress 04/23/2013: IMPRESSION:  1.  Negative Lexi scan myocardial perfusion imaging stress test   2. Normal left ventricular ejection fraction with normal wall  motion.   3.  Low risk study for major cardiovascular events.     Wynonia Musty [facility] Short Stay Center/Anesthesiology Phone [phone] 10/05/2021 4:10 PM

## 2021-10-05 NOTE — Anesthesia Preprocedure Evaluation (Addendum)
Anesthesia Evaluation  Patient identified by MRN, date of birth, ID band Patient awake    Reviewed: Allergy & Precautions, NPO status , Patient's Chart, lab work & pertinent test results, reviewed documented beta blocker date and time   History of Anesthesia Complications Negative for: history of anesthetic complications  Airway Mallampati: III  TM Distance: >3 FB Neck ROM: Full    Dental  (+) Edentulous Lower, Upper Dentures   Pulmonary COPD,  COPD inhaler, Current SmokerPatient did not abstain from smoking.,    Pulmonary exam normal        Cardiovascular hypertension, Pt. on medications and Pt. on home beta blockers + CAD and + Peripheral Vascular Disease  Normal cardiovascular exam   '23 Carotid US - 60-79% left ICAS, 1-39% right ICAS  '20 TTE - EF 60 to 65%. Moderately increased left ventricular hypertrophy. Mild aortic valve sclerosis without stenosis.     Neuro/Psych TIAnegative psych ROS   GI/Hepatic Neg liver ROS, GERD  Medicated and Controlled,  Endo/Other  diabetes, Poorly Controlled, Type 2, Oral Hypoglycemic Agents, Insulin Dependent  Renal/GU Renal InsufficiencyRenal disease     Musculoskeletal  (+) Arthritis , Osteoarthritis,    Abdominal   Peds  Hematology  On plavix    Anesthesia Other Findings   Reproductive/Obstetrics                           Anesthesia Physical Anesthesia Plan  ASA: 3  Anesthesia Plan: General   Post-op Pain Management: Tylenol PO (pre-op)*   Induction: Intravenous  PONV Risk Score and Plan: 1 and Treatment may vary due to age or medical condition, Ondansetron and Dexamethasone  Airway Management Planned: Oral ETT  Additional Equipment: Arterial line  Intra-op Plan:   Post-operative Plan: Extubation in OR  Informed Consent: I have reviewed the patients History and Physical, chart, labs and discussed the procedure including the risks,  benefits and alternatives for the proposed anesthesia with the patient or authorized representative who has indicated his/her understanding and acceptance.     Dental advisory given  Plan Discussed with: CRNA and Anesthesiologist  Anesthesia Plan Comments:       Anesthesia Quick Evaluation

## 2021-10-12 ENCOUNTER — Inpatient Hospital Stay (HOSPITAL_COMMUNITY): Payer: Medicare HMO | Admitting: Anesthesiology

## 2021-10-12 ENCOUNTER — Encounter (HOSPITAL_COMMUNITY)
Admission: RE | Disposition: A | Discharge status: Home / Self Care | Payer: Self-pay | Source: Home / Self Care | Attending: Vascular Surgery

## 2021-10-12 ENCOUNTER — Inpatient Hospital Stay (HOSPITAL_COMMUNITY): Payer: Medicare HMO

## 2021-10-12 ENCOUNTER — Encounter (HOSPITAL_COMMUNITY): Payer: Self-pay | Admitting: Vascular Surgery

## 2021-10-12 ENCOUNTER — Inpatient Hospital Stay (HOSPITAL_COMMUNITY): Payer: Medicare HMO | Admitting: Physician Assistant

## 2021-10-12 ENCOUNTER — Inpatient Hospital Stay (HOSPITAL_COMMUNITY)
Admission: RE | Admit: 2021-10-12 | Discharge: 2021-10-13 | DRG: 036 | Disposition: A | Discharge status: Home / Self Care | Payer: Medicare HMO | Attending: Vascular Surgery | Admitting: Vascular Surgery

## 2021-10-12 ENCOUNTER — Other Ambulatory Visit: Payer: Self-pay

## 2021-10-12 DIAGNOSIS — E1151 Type 2 diabetes mellitus with diabetic peripheral angiopathy without gangrene: Secondary | ICD-10-CM | POA: Diagnosis present

## 2021-10-12 DIAGNOSIS — I6522 Occlusion and stenosis of left carotid artery: Secondary | ICD-10-CM | POA: Diagnosis present

## 2021-10-12 DIAGNOSIS — Z794 Long term (current) use of insulin: Secondary | ICD-10-CM | POA: Diagnosis not present

## 2021-10-12 DIAGNOSIS — I2510 Atherosclerotic heart disease of native coronary artery without angina pectoris: Secondary | ICD-10-CM | POA: Diagnosis present

## 2021-10-12 DIAGNOSIS — F17210 Nicotine dependence, cigarettes, uncomplicated: Secondary | ICD-10-CM | POA: Diagnosis present

## 2021-10-12 DIAGNOSIS — E782 Mixed hyperlipidemia: Secondary | ICD-10-CM | POA: Diagnosis present

## 2021-10-12 DIAGNOSIS — Z800 Family history of malignant neoplasm of digestive organs: Secondary | ICD-10-CM

## 2021-10-12 DIAGNOSIS — Z8673 Personal history of transient ischemic attack (TIA), and cerebral infarction without residual deficits: Secondary | ICD-10-CM | POA: Diagnosis not present

## 2021-10-12 DIAGNOSIS — Z79899 Other long term (current) drug therapy: Secondary | ICD-10-CM | POA: Diagnosis not present

## 2021-10-12 DIAGNOSIS — Z7902 Long term (current) use of antithrombotics/antiplatelets: Secondary | ICD-10-CM

## 2021-10-12 DIAGNOSIS — K219 Gastro-esophageal reflux disease without esophagitis: Secondary | ICD-10-CM | POA: Diagnosis present

## 2021-10-12 DIAGNOSIS — J449 Chronic obstructive pulmonary disease, unspecified: Secondary | ICD-10-CM | POA: Diagnosis present

## 2021-10-12 DIAGNOSIS — Z8249 Family history of ischemic heart disease and other diseases of the circulatory system: Secondary | ICD-10-CM

## 2021-10-12 DIAGNOSIS — Z825 Family history of asthma and other chronic lower respiratory diseases: Secondary | ICD-10-CM

## 2021-10-12 DIAGNOSIS — I10 Essential (primary) hypertension: Secondary | ICD-10-CM

## 2021-10-12 DIAGNOSIS — Z8262 Family history of osteoporosis: Secondary | ICD-10-CM

## 2021-10-12 DIAGNOSIS — I6521 Occlusion and stenosis of right carotid artery: Principal | ICD-10-CM

## 2021-10-12 DIAGNOSIS — Z01818 Encounter for other preprocedural examination: Secondary | ICD-10-CM

## 2021-10-12 DIAGNOSIS — Z7984 Long term (current) use of oral hypoglycemic drugs: Secondary | ICD-10-CM

## 2021-10-12 HISTORY — PX: TRANSCAROTID ARTERY REVASCULARIZATIONÂ: SHX6778

## 2021-10-12 LAB — GLUCOSE, CAPILLARY
Glucose-Capillary: 265 mg/dL — ABNORMAL HIGH (ref 70–99)
Glucose-Capillary: 248 mg/dL — ABNORMAL HIGH (ref 70–99)
Glucose-Capillary: 215 mg/dL — ABNORMAL HIGH (ref 70–99)
Glucose-Capillary: 194 mg/dL — ABNORMAL HIGH (ref 70–99)
Glucose-Capillary: 210 mg/dL — ABNORMAL HIGH (ref 70–99)

## 2021-10-12 LAB — CBC
HCT: 34 % — ABNORMAL LOW (ref 39.0–52.0)
Hemoglobin: 12 g/dL — ABNORMAL LOW (ref 13.0–17.0)
MCH: 32.5 pg (ref 26.0–34.0)
MCHC: 35.3 g/dL (ref 30.0–36.0)
MCV: 92.1 fL (ref 80.0–100.0)
Platelets: 286 10*3/uL (ref 150–400)
RBC: 3.69 MIL/uL — ABNORMAL LOW (ref 4.22–5.81)
RDW: 13.4 % (ref 11.5–15.5)
WBC: 10.9 10*3/uL — ABNORMAL HIGH (ref 4.0–10.5)
nRBC: 0 % (ref 0.0–0.2)

## 2021-10-12 LAB — CREATININE, SERUM
Creatinine, Ser: 1.14 mg/dL (ref 0.61–1.24)
GFR, Estimated: >60 mL/min (ref >60)

## 2021-10-12 SURGERY — TRANSCAROTID ARTERY REVASCULARIZATION (TCAR)
Anesthesia: General | Site: Neck | Laterality: Left

## 2021-10-12 MED ORDER — HEPARIN 6000 UNIT IRRIGATION SOLUTION
Status: AC
  Filled 2021-10-12: qty 500

## 2021-10-12 MED ORDER — GLYCOPYRROLATE PF 0.2 MG/ML IJ SOSY
PREFILLED_SYRINGE | INTRAMUSCULAR | Status: DC | PRN
  Administered 2021-10-12: .2 mg via INTRAVENOUS

## 2021-10-12 MED ORDER — HYDROCHLOROTHIAZIDE 12.5 MG PO TABS
12.5000 mg | ORAL_TABLET | Freq: Every day | ORAL | Status: DC
  Administered 2021-10-12 – 2021-10-13 (×2): 12.5 mg via ORAL
  Filled 2021-10-12 (×2): qty 1

## 2021-10-12 MED ORDER — METOPROLOL TARTRATE 5 MG/5ML IV SOLN: 2.0000 mg | INTRAVENOUS | Status: DC | PRN

## 2021-10-12 MED ORDER — ONDANSETRON HCL 4 MG/2ML IJ SOLN: 4.0000 mg | Freq: Once | INTRAMUSCULAR | Status: DC | PRN

## 2021-10-12 MED ORDER — METOPROLOL TARTRATE 25 MG PO TABS
25.0000 mg | ORAL_TABLET | Freq: Two times a day (BID) | ORAL | Status: DC
  Administered 2021-10-12 – 2021-10-13 (×2): 25 mg via ORAL
  Filled 2021-10-12 (×2): qty 1

## 2021-10-12 MED ORDER — DOCUSATE SODIUM 100 MG PO CAPS
100.0000 mg | ORAL_CAPSULE | Freq: Every day | ORAL | Status: DC
  Filled 2021-10-12: qty 1

## 2021-10-12 MED ORDER — PHENYLEPHRINE HCL-NACL 20-0.9 MG/250ML-% IV SOLN
INTRAVENOUS | Status: DC | PRN
  Administered 2021-10-12: 50 ug/min via INTRAVENOUS

## 2021-10-12 MED ORDER — HEPARIN SODIUM (PORCINE) 1000 UNIT/ML IJ SOLN
INTRAMUSCULAR | Status: DC | PRN
  Administered 2021-10-12: 8000 [IU] via INTRAVENOUS

## 2021-10-12 MED ORDER — ONDANSETRON HCL 4 MG/2ML IJ SOLN
4.0000 mg | Freq: Four times a day (QID) | INTRAMUSCULAR | Status: DC | PRN
Start: 2021-10-12 — End: 2021-10-13
  Administered 2021-10-12 – 2021-10-13 (×2): 4 mg via INTRAVENOUS
  Filled 2021-10-12 (×2): qty 2

## 2021-10-12 MED ORDER — MAGNESIUM SULFATE 2 GM/50ML IV SOLN: 2.0000 g | Freq: Every day | INTRAVENOUS | Status: DC | PRN

## 2021-10-12 MED ORDER — FENTANYL CITRATE (PF) 100 MCG/2ML IJ SOLN
INTRAMUSCULAR | Status: AC
  Filled 2021-10-12: qty 2

## 2021-10-12 MED ORDER — HEPARIN SODIUM (PORCINE) 5000 UNIT/ML IJ SOLN
5000.0000 [IU] | Freq: Three times a day (TID) | INTRAMUSCULAR | Status: DC
  Administered 2021-10-13: 5000 [IU] via SUBCUTANEOUS
  Filled 2021-10-12: qty 1

## 2021-10-12 MED ORDER — HYDRALAZINE HCL 20 MG/ML IJ SOLN: 5.0000 mg | INTRAMUSCULAR | Status: DC | PRN

## 2021-10-12 MED ORDER — PROTAMINE SULFATE 10 MG/ML IV SOLN
INTRAVENOUS | Status: DC | PRN
  Administered 2021-10-12: 10 mg via INTRAVENOUS
  Administered 2021-10-12 (×2): 20 mg via INTRAVENOUS

## 2021-10-12 MED ORDER — GUAIFENESIN-DM 100-10 MG/5ML PO SYRP: 15.0000 mL | ORAL_SOLUTION | ORAL | Status: DC | PRN

## 2021-10-12 MED ORDER — ACETAMINOPHEN 650 MG RE SUPP: 325.0000 mg | RECTAL | Status: DC | PRN

## 2021-10-12 MED ORDER — PROPOFOL 10 MG/ML IV BOLUS
INTRAVENOUS | Status: AC
  Filled 2021-10-12: qty 20

## 2021-10-12 MED ORDER — ALUM & MAG HYDROXIDE-SIMETH 200-200-20 MG/5ML PO SUSP
15.0000 mL | ORAL | Status: DC | PRN
  Administered 2021-10-13: 30 mL via ORAL
  Filled 2021-10-12: qty 30

## 2021-10-12 MED ORDER — ACETAMINOPHEN 500 MG PO TABS: 1000.0000 mg | ORAL_TABLET | Freq: Once | ORAL | Status: DC

## 2021-10-12 MED ORDER — LISINOPRIL-HYDROCHLOROTHIAZIDE 20-12.5 MG PO TABS: 1.0000 | ORAL_TABLET | Freq: Every day | ORAL | Status: DC

## 2021-10-12 MED ORDER — BISACODYL 5 MG PO TBEC: 5.0000 mg | DELAYED_RELEASE_TABLET | Freq: Every day | ORAL | Status: DC | PRN

## 2021-10-12 MED ORDER — SODIUM CHLORIDE 0.9 % IV SOLN: INTRAVENOUS | Status: DC

## 2021-10-12 MED ORDER — ROCURONIUM BROMIDE 10 MG/ML (PF) SYRINGE
PREFILLED_SYRINGE | INTRAVENOUS | Status: DC | PRN
  Administered 2021-10-12: 30 mg via INTRAVENOUS
  Administered 2021-10-12: 50 mg via INTRAVENOUS

## 2021-10-12 MED ORDER — OXYCODONE HCL 5 MG/5ML PO SOLN: 5.0000 mg | Freq: Once | ORAL | Status: DC | PRN

## 2021-10-12 MED ORDER — LIDOCAINE HCL (PF) 1 % IJ SOLN
INTRAMUSCULAR | Status: AC
  Filled 2021-10-12: qty 5

## 2021-10-12 MED ORDER — SUGAMMADEX SODIUM 200 MG/2ML IV SOLN
INTRAVENOUS | Status: DC | PRN
  Administered 2021-10-12: 200 mg via INTRAVENOUS

## 2021-10-12 MED ORDER — CEFAZOLIN SODIUM-DEXTROSE 2-4 GM/100ML-% IV SOLN
2.0000 g | Freq: Three times a day (TID) | INTRAVENOUS | Status: AC
  Administered 2021-10-12 – 2021-10-13 (×2): 2 g via INTRAVENOUS
  Filled 2021-10-12 (×2): qty 100

## 2021-10-12 MED ORDER — CLOPIDOGREL BISULFATE 75 MG PO TABS
75.0000 mg | ORAL_TABLET | Freq: Every day | ORAL | Status: DC
  Administered 2021-10-13: 75 mg via ORAL
  Filled 2021-10-12: qty 1

## 2021-10-12 MED ORDER — CEFAZOLIN SODIUM-DEXTROSE 2-4 GM/100ML-% IV SOLN
2.0000 g | INTRAVENOUS | Status: AC
  Administered 2021-10-12: 2 g via INTRAVENOUS
  Filled 2021-10-12: qty 100

## 2021-10-12 MED ORDER — CHLORHEXIDINE GLUCONATE CLOTH 2 % EX PADS: 6.0000 | MEDICATED_PAD | Freq: Once | CUTANEOUS | Status: DC

## 2021-10-12 MED ORDER — FENTANYL CITRATE (PF) 100 MCG/2ML IJ SOLN
25.0000 ug | INTRAMUSCULAR | Status: DC | PRN
  Administered 2021-10-12 (×4): 25 ug via INTRAVENOUS

## 2021-10-12 MED ORDER — OXYCODONE HCL 5 MG PO TABS: 5.0000 mg | ORAL_TABLET | Freq: Once | ORAL | Status: DC | PRN

## 2021-10-12 MED ORDER — HEMOSTATIC AGENTS (NO CHARGE) OPTIME
TOPICAL | Status: DC | PRN
  Administered 2021-10-12: 1 via TOPICAL

## 2021-10-12 MED ORDER — IODIXANOL 320 MG/ML IV SOLN
INTRAVENOUS | Status: DC | PRN
  Administered 2021-10-12: 45 mL via INTRA_ARTERIAL

## 2021-10-12 MED ORDER — INSULIN ASPART 100 UNIT/ML IJ SOLN
0.0000 [IU] | INTRAMUSCULAR | Status: DC | PRN
  Administered 2021-10-12: 8 [IU] via SUBCUTANEOUS

## 2021-10-12 MED ORDER — PANTOPRAZOLE SODIUM 40 MG PO TBEC
40.0000 mg | DELAYED_RELEASE_TABLET | Freq: Every day | ORAL | Status: DC
  Administered 2021-10-12 – 2021-10-13 (×2): 40 mg via ORAL
  Filled 2021-10-12 (×2): qty 1

## 2021-10-12 MED ORDER — CHLORHEXIDINE GLUCONATE 0.12 % MT SOLN
15.0000 mL | Freq: Once | OROMUCOSAL | Status: AC
  Administered 2021-10-12: 15 mL via OROMUCOSAL
  Filled 2021-10-12: qty 15

## 2021-10-12 MED ORDER — HYDROMORPHONE HCL 1 MG/ML IJ SOLN
0.5000 mg | INTRAMUSCULAR | Status: DC | PRN
  Administered 2021-10-12: 0.5 mg via INTRAVENOUS
  Filled 2021-10-12: qty 0.5

## 2021-10-12 MED ORDER — HEPARIN SODIUM (PORCINE) 1000 UNIT/ML IJ SOLN
INTRAMUSCULAR | Status: AC
  Filled 2021-10-12: qty 10

## 2021-10-12 MED ORDER — ONDANSETRON HCL 4 MG/2ML IJ SOLN
INTRAMUSCULAR | Status: DC | PRN
  Administered 2021-10-12: 4 mg via INTRAVENOUS

## 2021-10-12 MED ORDER — ORAL CARE MOUTH RINSE: 15.0000 mL | Freq: Once | OROMUCOSAL | Status: AC

## 2021-10-12 MED ORDER — ACETAMINOPHEN 325 MG PO TABS: 325.0000 mg | ORAL_TABLET | ORAL | Status: DC | PRN

## 2021-10-12 MED ORDER — LISINOPRIL 20 MG PO TABS
20.0000 mg | ORAL_TABLET | Freq: Every day | ORAL | Status: DC
  Administered 2021-10-12 – 2021-10-13 (×2): 20 mg via ORAL
  Filled 2021-10-12 (×2): qty 1

## 2021-10-12 MED ORDER — HEPARIN 6000 UNIT IRRIGATION SOLUTION
Status: DC | PRN
  Administered 2021-10-12: 1

## 2021-10-12 MED ORDER — ASPIRIN 81 MG PO TBEC
81.0000 mg | DELAYED_RELEASE_TABLET | Freq: Every day | ORAL | Status: DC
  Administered 2021-10-13: 81 mg via ORAL
  Filled 2021-10-12: qty 1

## 2021-10-12 MED ORDER — LABETALOL HCL 5 MG/ML IV SOLN: 10.0000 mg | INTRAVENOUS | Status: DC | PRN

## 2021-10-12 MED ORDER — PROTAMINE SULFATE 10 MG/ML IV SOLN
INTRAVENOUS | Status: AC
  Filled 2021-10-12: qty 5

## 2021-10-12 MED ORDER — ROSUVASTATIN CALCIUM 20 MG PO TABS
40.0000 mg | ORAL_TABLET | Freq: Every day | ORAL | Status: DC
  Administered 2021-10-13: 40 mg via ORAL
  Filled 2021-10-12: qty 2

## 2021-10-12 MED ORDER — PHENYLEPHRINE 80 MCG/ML (10ML) SYRINGE FOR IV PUSH (FOR BLOOD PRESSURE SUPPORT)
PREFILLED_SYRINGE | INTRAVENOUS | Status: AC
Start: 2021-10-12 — End: ?
  Filled 2021-10-12: qty 10

## 2021-10-12 MED ORDER — FENTANYL CITRATE (PF) 250 MCG/5ML IJ SOLN
INTRAMUSCULAR | Status: AC
  Filled 2021-10-12: qty 5

## 2021-10-12 MED ORDER — SENNOSIDES-DOCUSATE SODIUM 8.6-50 MG PO TABS: 1.0000 | ORAL_TABLET | Freq: Every evening | ORAL | Status: DC | PRN

## 2021-10-12 MED ORDER — DEXAMETHASONE SODIUM PHOSPHATE 10 MG/ML IJ SOLN
INTRAMUSCULAR | Status: AC
  Filled 2021-10-12: qty 1

## 2021-10-12 MED ORDER — LIDOCAINE 2% (20 MG/ML) 5 ML SYRINGE
INTRAMUSCULAR | Status: DC | PRN
  Administered 2021-10-12: 60 mg via INTRAVENOUS

## 2021-10-12 MED ORDER — OXYCODONE-ACETAMINOPHEN 5-325 MG PO TABS
1.0000 | ORAL_TABLET | ORAL | Status: DC | PRN
  Administered 2021-10-12: 1 via ORAL
  Administered 2021-10-13: 2 via ORAL
  Filled 2021-10-12: qty 2
  Filled 2021-10-12: qty 1

## 2021-10-12 MED ORDER — INSULIN ASPART 100 UNIT/ML IJ SOLN
INTRAMUSCULAR | Status: AC
  Administered 2021-10-12: 5 [IU] via SUBCUTANEOUS
  Filled 2021-10-12: qty 1

## 2021-10-12 MED ORDER — LIDOCAINE 2% (20 MG/ML) 5 ML SYRINGE
INTRAMUSCULAR | Status: AC
  Filled 2021-10-12: qty 5

## 2021-10-12 MED ORDER — INSULIN ASPART 100 UNIT/ML IJ SOLN
0.0000 [IU] | Freq: Three times a day (TID) | INTRAMUSCULAR | Status: DC
  Administered 2021-10-12 – 2021-10-13 (×2): 3 [IU] via SUBCUTANEOUS

## 2021-10-12 MED ORDER — SODIUM CHLORIDE 0.9 % IV SOLN: 500.0000 mL | Freq: Once | INTRAVENOUS | Status: DC | PRN

## 2021-10-12 MED ORDER — PHENOL 1.4 % MT LIQD: 1.0000 | OROMUCOSAL | Status: DC | PRN

## 2021-10-12 MED ORDER — LACTATED RINGERS IV SOLN: INTRAVENOUS | Status: DC

## 2021-10-12 MED ORDER — BUPROPION HCL ER (SR) 150 MG PO TB12
150.0000 mg | ORAL_TABLET | Freq: Two times a day (BID) | ORAL | Status: DC
  Administered 2021-10-12 – 2021-10-13 (×2): 150 mg via ORAL
  Filled 2021-10-12 (×2): qty 1

## 2021-10-12 MED ORDER — POTASSIUM CHLORIDE CRYS ER 20 MEQ PO TBCR: 20.0000 meq | EXTENDED_RELEASE_TABLET | Freq: Every day | ORAL | Status: DC | PRN

## 2021-10-12 MED ORDER — ONDANSETRON HCL 4 MG/2ML IJ SOLN
INTRAMUSCULAR | Status: AC
  Filled 2021-10-12: qty 2

## 2021-10-12 MED ORDER — ACETAMINOPHEN 500 MG PO TABS
1000.0000 mg | ORAL_TABLET | Freq: Once | ORAL | Status: AC
  Administered 2021-10-12: 1000 mg via ORAL
  Filled 2021-10-12: qty 2

## 2021-10-12 MED ORDER — FENTANYL CITRATE (PF) 100 MCG/2ML IJ SOLN
INTRAMUSCULAR | Status: DC | PRN
Start: 2021-10-12 — End: 2021-10-12
  Administered 2021-10-12 (×3): 50 ug via INTRAVENOUS

## 2021-10-12 MED ORDER — 0.9 % SODIUM CHLORIDE (POUR BTL) OPTIME
TOPICAL | Status: DC | PRN
  Administered 2021-10-12: 1000 mL

## 2021-10-12 MED ORDER — FAMOTIDINE 20 MG PO TABS
20.0000 mg | ORAL_TABLET | Freq: Two times a day (BID) | ORAL | Status: DC
  Administered 2021-10-12 – 2021-10-13 (×2): 20 mg via ORAL
  Filled 2021-10-12 (×2): qty 1

## 2021-10-12 MED ORDER — METFORMIN HCL 500 MG PO TABS
1000.0000 mg | ORAL_TABLET | Freq: Two times a day (BID) | ORAL | Status: DC
Start: 2021-10-12 — End: 2021-10-13
  Administered 2021-10-12: 1000 mg via ORAL
  Filled 2021-10-12: qty 2

## 2021-10-12 MED ORDER — ROCURONIUM BROMIDE 10 MG/ML (PF) SYRINGE
PREFILLED_SYRINGE | INTRAVENOUS | Status: AC
  Filled 2021-10-12: qty 10

## 2021-10-12 MED ORDER — LACTATED RINGERS IV SOLN: INTRAVENOUS | Status: DC | PRN

## 2021-10-12 MED ORDER — GLYCOPYRROLATE PF 0.2 MG/ML IJ SOSY
PREFILLED_SYRINGE | INTRAMUSCULAR | Status: AC
  Filled 2021-10-12: qty 1

## 2021-10-12 MED ORDER — PHENYLEPHRINE 80 MCG/ML (10ML) SYRINGE FOR IV PUSH (FOR BLOOD PRESSURE SUPPORT)
PREFILLED_SYRINGE | INTRAVENOUS | Status: DC | PRN
  Administered 2021-10-12: 40 ug via INTRAVENOUS
  Administered 2021-10-12: 80 ug via INTRAVENOUS
  Administered 2021-10-12: 20 ug via INTRAVENOUS
  Administered 2021-10-12 (×2): 80 ug via INTRAVENOUS
  Administered 2021-10-12: 160 ug via INTRAVENOUS

## 2021-10-12 MED ORDER — PROPOFOL 10 MG/ML IV BOLUS
INTRAVENOUS | Status: DC | PRN
  Administered 2021-10-12: 130 mg via INTRAVENOUS

## 2021-10-12 SURGICAL SUPPLY — 65 items
ADH SKN CLS APL DERMABOND .7 (GAUZE/BANDAGES/DRESSINGS) ×2
BAG BANDED W/RUBBER/TAPE 36X54 (MISCELLANEOUS) ×3 IMPLANT
BAG COUNTER SPONGE SURGICOUNT (BAG) ×3 IMPLANT
BAG EQP BAND 135X91 W/RBR TAPE (MISCELLANEOUS) ×1
BAG SPNG CNTER NS LX DISP (BAG) ×1
BALLN STERLING RX 5X30X80 (BALLOONS) ×2
BALLOON STERLING RX 5X30X80 (BALLOONS) IMPLANT
CANISTER SUCT 3000ML PPV (MISCELLANEOUS) ×3 IMPLANT
CATH BEACON 5 .035 40 KMP TP (CATHETERS) IMPLANT
CATH BEACON 5 .038 40 KMP TP (CATHETERS) ×2
CATH ROBINSON RED A/P 18FR (CATHETERS) IMPLANT
CLIP LIGATING EXTRA MED SLVR (CLIP) ×3 IMPLANT
CLIP LIGATING EXTRA SM BLUE (MISCELLANEOUS) ×3 IMPLANT
COVER DOME SNAP 22 D (MISCELLANEOUS) ×3 IMPLANT
COVER PROBE W GEL 5X96 (DRAPES) ×4 IMPLANT
DERMABOND ADVANCED (GAUZE/BANDAGES/DRESSINGS) ×2
DERMABOND ADVANCED .7 DNX12 (GAUZE/BANDAGES/DRESSINGS) ×2 IMPLANT
DRAPE FEMORAL ANGIO 80X135IN (DRAPES) ×3 IMPLANT
ELECT REM PT RETURN 9FT ADLT (ELECTROSURGICAL) ×2
ELECTRODE REM PT RTRN 9FT ADLT (ELECTROSURGICAL) ×2 IMPLANT
GLOVE BIO SURGEON STRL SZ7.5 (GLOVE) ×3 IMPLANT
GOWN STRL REUS W/ TWL LRG LVL3 (GOWN DISPOSABLE) ×4 IMPLANT
GOWN STRL REUS W/ TWL XL LVL3 (GOWN DISPOSABLE) ×2 IMPLANT
GOWN STRL REUS W/TWL LRG LVL3 (GOWN DISPOSABLE) ×4
GOWN STRL REUS W/TWL XL LVL3 (GOWN DISPOSABLE) ×2
GUIDEWIRE ENROUTE 0.014 (WIRE) ×4 IMPLANT
HEMOSTAT SNOW SURGICEL 2X4 (HEMOSTASIS) IMPLANT
INSERT FOGARTY SM (MISCELLANEOUS) IMPLANT
INTRODUCER KIT GALT 7CM (INTRODUCER) ×2
KIT BASIN OR (CUSTOM PROCEDURE TRAY) ×3 IMPLANT
KIT ENCORE 26 ADVANTAGE (KITS) ×3 IMPLANT
KIT INTRODUCER GALT 7 (INTRODUCER) ×2 IMPLANT
KIT TURNOVER KIT B (KITS) ×3 IMPLANT
NDL HYPO 25GX1X1/2 BEV (NEEDLE) IMPLANT
NEEDLE HYPO 25GX1X1/2 BEV (NEEDLE) IMPLANT
PACK CAROTID (CUSTOM PROCEDURE TRAY) ×3 IMPLANT
POSITIONER HEAD DONUT 9IN (MISCELLANEOUS) ×3 IMPLANT
POWDER SURGICEL 3.0 GRAM (HEMOSTASIS) ×1 IMPLANT
PROTECTION STATION PRESSURIZED (MISCELLANEOUS)
SET MICROPUNCTURE 5F STIFF (MISCELLANEOUS) ×1 IMPLANT
SHEATH AVANTI 11CM 5FR (SHEATH) IMPLANT
SHUNT CAROTID BYPASS 10 (VASCULAR PRODUCTS) IMPLANT
SHUNT CAROTID BYPASS 12FRX15.5 (VASCULAR PRODUCTS) IMPLANT
STATION PROTECTION PRESSURIZED (MISCELLANEOUS) IMPLANT
STENT TRANSCAROTID SYSTEM 8X40 (Permanent Stent) ×1 IMPLANT
SUT MNCRL AB 4-0 PS2 18 (SUTURE) ×3 IMPLANT
SUT PROLENE 5 0 C 1 24 (SUTURE) ×4 IMPLANT
SUT PROLENE 6 0 BV (SUTURE) IMPLANT
SUT PROLENE 7 0 BV 1 (SUTURE) IMPLANT
SUT SILK 2 0 PERMA HAND 18 BK (SUTURE) ×3 IMPLANT
SUT SILK 2 0 SH CR/8 (SUTURE) ×3 IMPLANT
SUT SILK 3 0 (SUTURE)
SUT SILK 3-0 18XBRD TIE 12 (SUTURE) IMPLANT
SUT VIC AB 3-0 SH 27 (SUTURE) ×2
SUT VIC AB 3-0 SH 27X BRD (SUTURE) ×2 IMPLANT
SYR 10ML LL (SYRINGE) ×9 IMPLANT
SYR 20ML LL LF (SYRINGE) ×3 IMPLANT
SYR CONTROL 10ML LL (SYRINGE) IMPLANT
SYSTEM TRANSCAROTID NEUROPRTCT (MISCELLANEOUS) ×2 IMPLANT
TOWEL GREEN STERILE (TOWEL DISPOSABLE) ×3 IMPLANT
TRANSCAROTID NEUROPROTECT SYS (MISCELLANEOUS) ×2
TUBING ART PRESS 48 MALE/FEM (TUBING) IMPLANT
TUBING EXTENTION W/L.L. (IV SETS) IMPLANT
WATER STERILE IRR 1000ML POUR (IV SOLUTION) ×3 IMPLANT
WIRE BENTSON .035X145CM (WIRE) ×3 IMPLANT

## 2021-10-12 NOTE — Discharge Instructions (Signed)
   [facility]  Discharge Instructions   Carotid Surgery  Please refer to the following instructions for your post-procedure care. Your surgeon or physician assistant will discuss any changes with you.  Activity  You are encouraged to walk as much as you can. You can slowly return to normal activities but must avoid strenuous activity and heavy lifting until your doctor tell you it's okay. Avoid activities such as vacuuming or swinging a golf club. You can drive after one week if you are comfortable and you are no longer taking prescription pain medications. It is normal to feel tired for serval weeks after your surgery. It is also normal to have difficulty with sleep habits, eating, and bowel movements after surgery. These will go away with time.  Bathing/Showering  Shower daily after you go home. Do not soak in a bathtub, hot tub, or swim until the incision heals completely.  Incision Care  Shower every day. Clean your incision with mild soap and water. Pat the area dry with a clean towel. You do not need a bandage unless otherwise instructed. Do not apply any ointments or creams to your incision. You may have skin glue on your incision. Do not peel it off. It will come off on its own in about one week. Your incision may feel thickened and raised for several weeks after your surgery. This is normal and the skin will soften over time.   For Men Only: It's okay to shave around the incision but do not shave the incision itself for 2 weeks. It is common to have numbness under your chin that could last for several months.  Diet  Resume your normal diet. There are no special food restrictions following this procedure. A low fat/low cholesterol diet is recommended for all patients with vascular disease. In order to heal from your surgery, it is CRITICAL to get adequate nutrition. Your body requires vitamins, minerals, and protein. Vegetables are the best source of  vitamins and minerals. Vegetables also provide the perfect balance of protein. Processed food has little nutritional value, so try to avoid this.  Medications  Resume taking all of your medications unless your doctor or physician assistant tells you not to. If your incision is causing pain, you may take over-the- counter pain relievers such as acetaminophen (Tylenol). If you were prescribed a stronger pain medication, please be aware these medications can cause nausea and constipation. Prevent nausea by taking the medication with a snack or meal. Avoid constipation by drinking plenty of fluids and eating foods with a high amount of fiber, such as fruits, vegetables, and grains.  Do not take Tylenol if you are taking prescription pain medications.  Restart taking your Metformin on 10/14/2021.  Follow Up  Our office will schedule a follow up appointment 2-3 weeks following discharge.  Please call us immediately for any of the following conditions  Increased pain, redness, drainage (pus) from your incision site. Fever of 101 degrees or higher. If you should develop stroke (slurred speech, difficulty swallowing, weakness on one side of your body, loss of vision) you should call 911 and go to the nearest emergency room.  Reduce your risk of vascular disease:  Stop smoking. If you would like help call [facility] at 1-800-QUIT-NOW (205-126-0189) or [place] at [phone]. Manage your cholesterol Maintain a desired weight Control your diabetes Keep your blood pressure down  If you have any questions, please call the office at [phone].

## 2021-10-12 NOTE — Anesthesia Postprocedure Evaluation (Signed)
Anesthesia Post Note  Patient: Jimmy Duran [facility]  Procedure(s) Performed: Left Transcarotid Artery Revascularization (Left: Neck)     Patient location during evaluation: PACU Anesthesia Type: General Level of consciousness: awake and alert Pain management: pain level controlled Vital Signs Assessment: post-procedure vital signs reviewed and stable Respiratory status: spontaneous breathing, nonlabored ventilation, respiratory function stable and patient connected to nasal cannula oxygen Cardiovascular status: blood pressure returned to baseline and stable Postop Assessment: no apparent nausea or vomiting Anesthetic complications: no   No notable events documented.  Last Vitals:  Vitals:   10/12/21 1030 10/12/21 1130  BP: 102/70 109/75  Pulse: 79 83  Resp: 13 13  Temp:    SpO2: 95% 98%    Last Pain:  Vitals:   10/12/21 1130  TempSrc:   PainSc: [place]

## 2021-10-12 NOTE — Interval H&P Note (Signed)
History and Physical Interval Note:  10/12/2021 7:20 AM  [name]  has presented today for surgery, with the diagnosis of LEFT CARTOID ARTERY STENOSIS.  The various methods of treatment have been discussed with the patient and family. After consideration of risks, benefits and other options for treatment, the patient has consented to  Procedure(s): Left Transcarotid Artery Revascularization (Left) as a surgical intervention.  The patient's history has been reviewed, patient examined, no change in status, stable for surgery.  I have reviewed the patient's chart and labs.  Questions were answered to the patient's satisfaction.     Jimmy Duran

## 2021-10-12 NOTE — Anesthesia Procedure Notes (Signed)
Procedure Name: Intubation Date/Time: 10/12/2021 7:46 AM  Performed by: Barrington Ellison, CRNAPre-anesthesia Checklist: Patient identified, Emergency Drugs available, Suction available and Patient being monitored Patient Re-evaluated:Patient Re-evaluated prior to induction Oxygen Delivery Method: Circle System Utilized Preoxygenation: Pre-oxygenation with 100% oxygen Induction Type: IV induction Ventilation: Mask ventilation without difficulty Laryngoscope Size: Mac and 3 Grade View: Grade II Tube type: Oral Tube size: 7.5 mm Number of attempts: 1 Airway Equipment and Method: Stylet and Oral airway Placement Confirmation: ETT inserted through vocal cords under direct vision, positive ETCO2 and breath sounds checked- equal and bilateral Secured at: 22 cm Tube secured with: Tape Dental Injury: Teeth and Oropharynx as per pre-operative assessment

## 2021-10-12 NOTE — Op Note (Signed)
Patient name: Jimmy Duran MRN: [id number] DOB: [birth year] Sex: male  10/12/2021 Pre-operative Diagnosis: Asymptomatic left ICA stenosis Post-operative diagnosis:  Same Surgeon:  Erlene Quan C. Donzetta Matters, MD Assistant: Paulo Fruit, PA Procedure Performed: 1.  Ultrasound-guided cannulation of right common femoral vein for placement of flow reversal sheath 2.  Transcarotid artery placement of 8 x 40 mm EnRoute stent with flow reversal neuro protection and interpretation of carotid and intracerebral imaging  Indications: 66 year old male with a history of right carotid endarterectomy as well as mesenteric ischemia now with asymptomatic left carotid artery stenosis.  There is a high-grade stenosis at the bifurcation with mixed soft and calcified plaque as well as a dissection just distal to this on the CT scan and calcium higher up and what is a very tortuous ICA at the siphon.  For these reasons I have discussed with the patient and Dr. Donnetta Hutching proceeding with transcarotid artery stenting which the patient presents today.  Findings: Common carotid artery was actually quite deep but I was able to find it proximal to the initial lesion.  After initial cannulation and placement of flow reversal sheath I could not get the 014 wire to initially pass was able to get the J-wire passed and placed the catheter followed by the smaller wire.  We were able to cross the lesion easily past the dissection but I stopped short of the quite tortuous ICA siphon where there was heavy calcium and I confirmed that I was in the ICA angiographically.  After stenting there was no residual stenosis where previously was greater than 90%.  I did evaluate the concern for carotid dissection proximally in the common carotid artery but I did not demonstrate any dissection at completion.  There was good Doppler flow both above and below the cannulation site after removal of the sheath and the patient was neurologically intact upon awakening  from anesthesia.   Procedure:  The patient was identified in the holding area and taken to the operating room where is put supine operative when general anesthesia was induced.  He was sterilely prepped and draped in the left neck and chest in usual fashion as well as the bilateral groins a timeout was called.  We began using ultrasound to cannulate the right common femoral vein with direct ultrasound visualization.  This was performed with micropuncture needle followed by wire and sheath followed by a Bentson wire and the 8 French venous return sheath this was sutured to the skin in the usual fashion.  Attention was then turned to the neck for a vertical incision was made from the carotid extending cephalad for approximately 4 cm.  We dissected down through the 2 heads of the sternocleidomastoid identify the IJ and this was retracted laterally followed by the common carotid artery and the patient was fully heparinized and ACT returned greater than 300.  I placed an umbilical tape and vessel loop in a Potts configuration around the proximal common carotid artery.  I then planned the cannulation site and placed a U-stitch with 5-0 Prolene suture.  I then cannulated the common carotid artery and placed a wire and a 3 cm followed by the micropuncture sheath into 2-1/2 cm.  We performed angiography but unfortunately given the depth and its deep trajectory I did lose access.  I then elected to recannulate in the same site with micropuncture needle again with the wire and the sheath now to 3 cm given that I knew I had a longer run way up to  the common carotid artery bifurcation.  This time I was able to get the Amplatz and easily to the bifurcation and then placed the flow reversal sheath and sutured this to the skin.  Flow reversal was initiated and confirmed with high flow.  I then attempted to pass an 014 wire but it appeared to be in a dissection plane which ultimately may have been just disease in the common  carotid artery.  An IJ Amplatz wire did pass easily and I placed a Kumpe catheter followed by the 014 wire.  TCAR timeout was then performed and the common carotid artery was clamped and we did confirm flow reversal.  I then easily crossed the lesion using Kumpe catheter and a 1 4 wire I did not replace this to high up into the carotid siphons are performed angiography to confirm wire placement.  We then primarily dilated the common carotid artery into the ICA proximally with a 5 x 30 mm balloon and then primarily stented with a 8 x 40 mm stent.  We waited 2 minutes for carotid washout during this time we did roll the image intensifier to evaluate the stent it did appear patent.  Completion angiography of the stent demonstrated patency without much residual stenosis there was maybe a small waist in the center I elected no intervention.  We then placed the Kumpe catheter back over the 014 wire then placed the Amplatz wire up to the level of the stent.  I retracted on the flow reversal sheath performed angiography did not demonstrate any dissection.  With this we then released our clamp and removed the wires.  Flow reversal was discontinued we remove the sheath and suture-ligated the cannulation site.  Flow was confirmed with Doppler and 50 mg of protamine was administered.  We obtain hemostasis in the wound and irrigated and closed in layers with the platysma with Vicryl and the skin with Monocryl and Dermabond was placed at the skin level.  Sheath was pulled in the right groin and pressure held till hemostasis obtained after protamine was administered.  Patient was then awakened from anesthesia he was noted to be neurologically intact was transferred to the recovery area in stable condition.  All counts were correct at completion.   Given the complexity of the case,  the assistant was necessary in order to expedient the procedure and safely perform the technical aspects of the operation.  The assistant provided  traction and countertraction to assist with exposure of the common carotid artery and played a critical role in passing wires catheters as well as stent placement. These skills could not have been adequately performed by a scrub tech assistant.    EBL: 100 cc  Contrast: 45 cc      C. Donzetta Matters, MD [facility] [place] Office: [phone] Pager: [phone]

## 2021-10-12 NOTE — Transfer of Care (Signed)
Immediate Anesthesia Transfer of Care Note  Patient: Jimmy Duran [facility]  Procedure(s) Performed: Left Transcarotid Artery Revascularization (Left: Neck)  Patient Location: PACU  Anesthesia Type:General  Level of Consciousness: awake, alert  and oriented  Airway & Oxygen Therapy: Patient Spontanous Breathing  Post-op Assessment: Report given to RN  Post vital signs: Reviewed and stable  Last Vitals:  Vitals Value Taken Time  BP 110/71 10/12/21 0940  Temp    Pulse 84 10/12/21 0941  Resp 8 10/12/21 0941  SpO2 94 % 10/12/21 0941  Vitals shown include unvalidated device data.  Last Pain:  Vitals:   10/12/21 0613  TempSrc:   PainSc: 0-No pain         Complications: No notable events documented.

## 2021-10-12 NOTE — Progress Notes (Signed)
  Day of Surgery Note    Subjective:  no complaints; resting comfortably in recovery   Vitals:   10/12/21 0549 10/12/21 0940  BP: (!) 159/80   Pulse: (!) 57   Resp: 18   Temp: 97.7 F (36.5 C) 97.9 F (36.6 C)  SpO2: 100%     Incisions:   left neck is clean and dry; right groin soft Neuro:  moving all extremities equally; tongue is midline Cardiac:  regular Lungs:  non labored    Assessment/Plan:  This is a 66 y.o. male who is s/p  Left TCAR  -pt doing well in recovery and neuro in tact -transfer to [place] later today.  Anticipate dc tomorrow if evening is uneventful Continue plavix/statin   Leontine Locket, PA-C 10/12/2021 10:03 AM [phone]

## 2021-10-12 NOTE — Anesthesia Procedure Notes (Signed)
Arterial Line Insertion Start/End8/01/2022 7:00 AM, 10/12/2021 7:05 AM  Patient location: Pre-op. Preanesthetic checklist: patient identified, IV checked, risks and benefits discussed and pre-op evaluation Lidocaine 1% used for infiltration Right, radial was placed Catheter size: 20 G Hand hygiene performed  and maximum sterile barriers used   Attempts: 1 Procedure performed without using ultrasound guided technique. Following insertion, dressing applied and Biopatch. Post procedure assessment: normal  Patient tolerated the procedure well with no immediate complications.

## 2021-10-13 LAB — BASIC METABOLIC PANEL
Anion gap: 8 (ref 5–15)
BUN: 16 mg/dL (ref 8–23)
CO2: 23 mmol/L (ref 22–32)
Calcium: 8.4 mg/dL — ABNORMAL LOW (ref 8.9–10.3)
Chloride: 102 mmol/L (ref 98–111)
Creatinine, Ser: 1.14 mg/dL (ref 0.61–1.24)
GFR, Estimated: >60 mL/min (ref >60)
Glucose, Bld: 180 mg/dL — ABNORMAL HIGH (ref 70–99)
Potassium: 3.9 mmol/L (ref 3.5–5.1)
Sodium: 133 mmol/L — ABNORMAL LOW (ref 135–145)

## 2021-10-13 LAB — GLUCOSE, CAPILLARY: Glucose-Capillary: 200 mg/dL — ABNORMAL HIGH (ref 70–99)

## 2021-10-13 LAB — CBC
HCT: 32.2 % — ABNORMAL LOW (ref 39.0–52.0)
Hemoglobin: 11.3 g/dL — ABNORMAL LOW (ref 13.0–17.0)
MCH: 32.4 pg (ref 26.0–34.0)
MCHC: 35.1 g/dL (ref 30.0–36.0)
MCV: 92.3 fL (ref 80.0–100.0)
Platelets: 271 10*3/uL (ref 150–400)
RBC: 3.49 MIL/uL — ABNORMAL LOW (ref 4.22–5.81)
RDW: 13.5 % (ref 11.5–15.5)
WBC: 9.8 10*3/uL (ref 4.0–10.5)
nRBC: 0 % (ref 0.0–0.2)

## 2021-10-13 LAB — LIPID PANEL
Cholesterol: 82 mg/dL (ref 0–200)
HDL: 30 mg/dL — ABNORMAL LOW (ref >40)
Total CHOL/HDL Ratio: 2.7 RATIO
Triglycerides: 318 mg/dL — ABNORMAL HIGH (ref <150)
VLDL: 64 mg/dL — ABNORMAL HIGH (ref 0–40)

## 2021-10-13 MED ORDER — ASPIRIN 81 MG PO TBEC: 81.0000 mg | DELAYED_RELEASE_TABLET | Freq: Every day | ORAL | 12 refills | Status: DC

## 2021-10-13 MED ORDER — OXYCODONE-ACETAMINOPHEN 5-325 MG PO TABS: 1.0000 | ORAL_TABLET | Freq: Four times a day (QID) | ORAL | 0 refills | Status: DC | PRN

## 2021-10-13 NOTE — Discharge Summary (Signed)
Discharge Summary     Jimmy Duran 07-Jan-1956 66 y.o. male  [id number]  Admission Date: 10/12/2021  Discharge Date: 10/13/2021  Physician: Thomes Lolling*  Admission Diagnosis: Carotid stenosis, asymptomatic, left [I65.22]   HPI:   This is a 66 y.o. male here today for follow-up and discussion of recent CT scan.  He is here today with his wife.  He remains asymptomatic from carotid disease  Hospital Course:  The patient was admitted to the hospital and taken to the operating room on 10/12/2021 and underwent left TCAR    Findings: Common carotid artery was actually quite deep but I was able to find it proximal to the initial lesion.  After initial cannulation and placement of flow reversal sheath I could not get the 014 wire to initially pass was able to get the J-wire passed and placed the catheter followed by the smaller wire.  We were able to cross the lesion easily past the dissection but I stopped short of the quite tortuous ICA siphon where there was heavy calcium and I confirmed that I was in the ICA angiographically.  After stenting there was no residual stenosis where previously was greater than 90%.  I did evaluate the concern for carotid dissection proximally in the common carotid artery but I did not demonstrate any dissection at completion.  There was good Doppler flow both above and below the cannulation site after removal of the sheath and the patient was neurologically intact upon awakening from anesthesia.  The pt tolerated the procedure well and was transported to the PACU in good condition.   By POD 1, the pt neuro status was in tact.  He was able to void and ambulate.    Recent Labs    10/13/21 0513  NA 133*  K 3.9  CL 102  CO2 23  GLUCOSE 180*  BUN 16  CALCIUM 8.4*   Recent Labs    10/12/21 1605 10/13/21 0513  WBC 10.9* 9.8  HGB 12.0* 11.3*  HCT 34.0* 32.2*  PLT 286 271   No results for input(s): "INR" in the last 72  hours.   Discharge Instructions     Discharge patient   Complete by: As directed    Discharge disposition: 01-Home or Self Care   Discharge patient date: 10/13/2021       Discharge Diagnosis:  Carotid stenosis, asymptomatic, left [I65.22]  Secondary Diagnosis: Patient Active Problem List   Diagnosis Date Noted   Carotid stenosis, asymptomatic, left 10/12/2021   Asymptomatic carotid artery stenosis without infarction, right 10/08/2019   Abdominal pain, left lower quadrant 06/18/2018   History of colonic polyps 11/05/2017   Diarrhea 08/15/2014   Dizziness 01/04/2014   Transient ischemic attack 01/04/2014   Palpitations 04/22/2013   Esophageal dysphagia 07/02/2012   OA (osteoarthritis) of knee 06/17/2011   GERD (gastroesophageal reflux disease) 05/04/2010   DM type 2 causing vascular disease ([place]) 05/04/2010   COPD (chronic obstructive pulmonary disease) ([place]) 05/04/2010   Hemorrhoids 05/04/2010   Mixed hyperlipidemia 04/25/2010   Current smoker 04/25/2010   Essential hypertension, benign 04/25/2010   CORONARY ATHEROSCLEROSIS NATIVE CORONARY ARTERY 04/25/2010   CAROTID ARTERY DISEASE 04/25/2010   Past Medical History:  Diagnosis Date   AAA (abdominal aortic aneurysm) (HCC)    Arthritis    Carotid artery disease (HCC)    Colon polyp    COPD (chronic obstructive pulmonary disease) ([place])    Coronary artery disease    Nonobstructive   Essential hypertension  GERD (gastroesophageal reflux disease)    Hemorrhoids    History of TIA (transient ischemic attack)    Low back pain    Lumbar radiculopathy    Mixed hyperlipidemia    Palpitations    Peripheral vascular disease ([place])    Shingles 09/23/2019   Type 2 diabetes mellitus (HCC)     Allergies as of 10/13/2021   No Known Allergies      Medication List     TAKE these medications    albuterol 108 (90 Base) MCG/ACT inhaler Commonly known as: VENTOLIN HFA Inhale 2 puffs into the lungs every 6 (six) hours as  needed for wheezing or shortness of breath.   aspirin EC 81 MG tablet Take 1 tablet (81 mg total) by mouth daily at 6 (six) AM. Swallow whole. Start taking on: October 14, 2021   [facility] FAST PAIN RELIEF PO Take 1 packet by mouth daily as needed (pain).   buPROPion 150 MG 12 hr tablet Commonly known as: WELLBUTRIN SR Take 150 mg by mouth 2 (two) times daily.   clopidogrel 75 MG tablet Commonly known as: Plavix Take 1 tablet (75 mg total) by mouth daily.   diphenhydrAMINE 25 MG tablet Commonly known as: BENADRYL Take 25 mg by mouth daily as needed for allergies.   famotidine 20 MG tablet Commonly known as: Pepcid Take 1 tablet (20 mg total) by mouth 2 (two) times daily.   insulin NPH Human 100 UNIT/ML injection Commonly known as: NOVOLIN N Inject 25 Units into the skin in the morning and at bedtime.   lisinopril-hydrochlorothiazide 20-12.5 MG tablet Commonly known as: ZESTORETIC Take 1 tablet by mouth once daily   metFORMIN 1000 MG tablet Commonly known as: GLUCOPHAGE Take 1 tablet (1,000 mg total) by mouth 2 (two) times daily with a meal.   metoprolol tartrate 25 MG tablet Commonly known as: LOPRESSOR Take 1 tablet by mouth twice daily   NovoLOG Mix 70/30 FlexPen (70-30) 100 UNIT/ML FlexPen Generic drug: insulin aspart protamine - aspart Inject 30 Units into the skin 2 (two) times daily with a meal.   oxyCODONE-acetaminophen 5-325 MG tablet Commonly known as: Percocet Take 1 tablet by mouth every 6 (six) hours as needed for severe pain.   pantoprazole 40 MG tablet Commonly known as: PROTONIX TAKE 1 TABLET BY MOUTH TWICE DAILY BEFORE A MEAL   rosuvastatin 40 MG tablet Commonly known as: CRESTOR Take 1 tablet (40 mg total) by mouth daily.   Trelegy Ellipta 100-62.5-25 MCG/ACT Aepb Generic drug: Fluticasone-Umeclidin-Vilant Inhale 1 Inhalation into the lungs daily.         [facility]   Discharge Instructions   Carotid  Surgery   Please refer to the following instructions for your post-procedure care. Your surgeon or physician assistant will discuss any changes with you.   Activity   You are encouraged to walk as much as you can. You can slowly return to normal activities but must avoid strenuous activity and heavy lifting until your doctor tell you it's okay. Avoid activities such as vacuuming or swinging a golf club. You can drive after one week if you are comfortable and you are no longer taking prescription pain medications. It is normal to feel tired for serval weeks after your surgery. It is also normal to have difficulty with sleep habits, eating, and bowel movements after surgery. These will go away with time.   Bathing/Showering   Shower daily after you go home. Do not soak in a  bathtub, hot tub, or swim until the incision heals completely.   Incision Care   Shower every day. Clean your incision with mild soap and water. Pat the area dry with a clean towel. You do not need a bandage unless otherwise instructed. Do not apply any ointments or creams to your incision. You may have skin glue on your incision. Do not peel it off. It will come off on its own in about one week. Your incision may feel thickened and raised for several weeks after your surgery. This is normal and the skin will soften over time.    For Men Only: It's okay to shave around the incision but do not shave the incision itself for 2 weeks. It is common to have numbness under your chin that could last for several months.   Diet   Resume your normal diet. There are no special food restrictions following this procedure. A low fat/low cholesterol diet is recommended for all patients with vascular disease. In order to heal from your surgery, it is CRITICAL to get adequate nutrition. Your body requires vitamins, minerals, and protein. Vegetables are the best source of vitamins and minerals. Vegetables also provide the perfect balance of  protein. Processed food has little nutritional value, so try to avoid this.   Medications   Resume taking all of your medications unless your doctor or physician assistant tells you not to. If your incision is causing pain, you may take over-the- counter pain relievers such as acetaminophen (Tylenol). If you were prescribed a stronger pain medication, please be aware these medications can cause nausea and constipation. Prevent nausea by taking the medication with a snack or meal. Avoid constipation by drinking plenty of fluids and eating foods with a high amount of fiber, such as fruits, vegetables, and grains.  Do not take Tylenol if you are taking prescription pain medications.   Restart taking your Metformin on 10/14/2021.   Follow Up   Our office will schedule a follow up appointment 2-3 weeks following discharge.   Please call us immediately for any of the following conditions   Increased pain, redness, drainage (pus) from your incision site. Fever of 101 degrees or higher. If you should develop stroke (slurred speech, difficulty swallowing, weakness on one side of your body, loss of vision) you should call 911 and go to the nearest emergency room.   Reduce your risk of vascular disease:   Stop smoking. If you would like help call [facility] at 1-800-QUIT-NOW [phone]) or [place] at 2512594997. Manage your cholesterol Maintain a desired weight Control your diabetes Keep your blood pressure down   If you have any questions, please call the office at [phone].   Prescriptions given: 1.   Roxicet #8 No Refill 2.  Aspirin '81mg'$  daily  Disposition: home  Patient's condition: is Good  Follow up: 1. [facility] in [place] in 4 weeks with carotid duplex   Leontine Locket, PA-C [facility] [phone]   --- For [facility] Registry use ---   Modified Rankin score at D/C (0-6): 0  IV medication needed for:  1. Hypertension: No 2. Hypotension:  No  Post-op Complications: No  1. Post-op CVA or TIA: No  If yes: Event classification (right eye, left eye, right cortical, left cortical, verterobasilar, other): n/a  If yes: Timing of event (intra-op, <6 hrs post-op, >=6 hrs post-op, unknown): n/a  2. CN injury: No  If yes: CN n/a injuried   3. Myocardial infarction: No  If yes:  Dx by (EKG or clinical, Troponin): n/a  4.  CHF: No  5.  Dysrhythmia (new): No  6. Wound infection: No  7. Reperfusion symptoms: No  8. Return to OR: No  If yes: return to OR for (bleeding, neurologic, other CEA incision, other): n/a  Discharge medications: Statin use:  Yes ASA use:  Yes   Beta blocker use:  Yes ACE-Inhibitor use:  Yes  ARB use:  No CCB use: No P2Y12 Antagonist use: Yes, [ x] Plavix, '[ ]'$  Plasugrel, '[ ]'$  Ticlopinine, '[ ]'$  Ticagrelor, '[ ]'$  Other, '[ ]'$  No for medical reason, '[ ]'$  Non-compliant, '[ ]'$  Not-indicated Anti-coagulant use:  No, '[ ]'$  Warfarin, '[ ]'$  Rivaroxaban, '[ ]'$  Dabigatran,

## 2021-10-13 NOTE — Progress Notes (Signed)
Pt discharged home with family. Pt left with all belongings. Pt received AVS and received partial teaching. Pt stated that he felt nauseous and needed to leave during discharge teaching. Pt expressed that he knew all of his discharge instructions. Pt accompanied to car by RN via wheelchair.

## 2021-10-13 NOTE — Progress Notes (Signed)
  Progress Note    10/13/2021 10:03 AM 1 Day Post-Op  Subjective:  ready to go home.  Says he is swallowing fine.  He has voided and walked to the bathroom.  Says he is to f/u with Dr. Donnetta Hutching.   Afebrile HR 60's-70's NSR 774'J-287'O systolic 67% RA  Gtts:  none  Vitals:   10/13/21 0330 10/13/21 0946  BP: 102/72 106/67  Pulse: 67 68  Resp: 15 18  Temp: 98.2 F (36.8 C) 98.2 F (36.8 C)  SpO2: 93% 92%     Physical Exam: Neuro:  in tact Lungs:  non labored Incision:  clean and dry; right groin soft.   CBC    Component Value Date/Time   WBC 9.8 10/13/2021 0513   RBC 3.49 (L) 10/13/2021 0513   HGB 11.3 (L) 10/13/2021 0513   HCT 32.2 (L) 10/13/2021 0513   PLT 271 10/13/2021 0513   MCV 92.3 10/13/2021 0513   MCH 32.4 10/13/2021 0513   MCHC 35.1 10/13/2021 0513   RDW 13.5 10/13/2021 0513   LYMPHSABS 1.6 01/28/2018 1220   MONOABS 0.6 01/28/2018 1220   EOSABS 0.2 01/28/2018 1220   BASOSABS 0.1 01/28/2018 1220    BMET    Component Value Date/Time   NA 133 (L) 10/13/2021 0513   NA 136 09/10/2021 0923   K 3.9 10/13/2021 0513   CL 102 10/13/2021 0513   CO2 23 10/13/2021 0513   GLUCOSE 180 (H) 10/13/2021 0513   BUN 16 10/13/2021 0513   BUN 15 09/10/2021 0923   CREATININE 1.14 10/13/2021 0513   CALCIUM 8.4 (L) 10/13/2021 0513   GFRNONAA >60 10/13/2021 0513   GFRAA >60 10/09/2019 0729     Intake/Output Summary (Last 24 hours) at 10/13/2021 1003 Last data filed at 10/13/2021 0600 Gross per 24 hour  Intake 460 ml  Output 550 ml  Net -90 ml      Assessment/Plan:  This is a 66 y.o. male who is s/p left TCAR 1 Day Post-Op  -pt is doing well this am. -pt neuro exam is in tact -renal function looks good this am -pt has ambulated -pt has voided -f/u with Dr. Donnetta Hutching in 4 weeks with carotid duplex   Leontine Locket, PA-C [facility] [phone]   VASCULAR STAFF ADDENDUM: I have independently interviewed and examined the patient. I agree  with the above.  Home today  Cass[place]ria Santee, MD [facility] Phone Number: [phone] 10/13/2021 10:03 AM

## 2021-10-13 NOTE — Progress Notes (Addendum)
  Progress Note    10/13/2021 7:28 AM 1 Day Post-Op  Subjective:  ready to go home.  Says he is swallowing fine.  He has voided and walked to the bathroom.  Says he is to f/u with Dr. Donnetta Hutching.   Afebrile HR 60's-70's NSR 314'H-702'O systolic 37% RA  Gtts:  none  Vitals:   10/13/21 0011 10/13/21 0330  BP: 111/77 102/72  Pulse: 91 67  Resp: 16 15  Temp: 98.5 F (36.9 C) 98.2 F (36.8 C)  SpO2: 93% 93%     Physical Exam: Neuro:  in tact Lungs:  non labored Incision:  clean and dry; right groin soft.   CBC    Component Value Date/Time   WBC 9.8 10/13/2021 0513   RBC 3.49 (L) 10/13/2021 0513   HGB 11.3 (L) 10/13/2021 0513   HCT 32.2 (L) 10/13/2021 0513   PLT 271 10/13/2021 0513   MCV 92.3 10/13/2021 0513   MCH 32.4 10/13/2021 0513   MCHC 35.1 10/13/2021 0513   RDW 13.5 10/13/2021 0513   LYMPHSABS 1.6 01/28/2018 1220   MONOABS 0.6 01/28/2018 1220   EOSABS 0.2 01/28/2018 1220   BASOSABS 0.1 01/28/2018 1220    BMET    Component Value Date/Time   NA 135 10/04/2021 1325   NA 136 09/10/2021 0923   K 3.9 10/04/2021 1325   CL 100 10/04/2021 1325   CO2 25 10/04/2021 1325   GLUCOSE 299 (H) 10/04/2021 1325   BUN 21 10/04/2021 1325   BUN 15 09/10/2021 0923   CREATININE 1.14 10/12/2021 1605   CALCIUM 9.6 10/04/2021 1325   GFRNONAA >60 10/12/2021 1605   GFRAA >60 10/09/2019 0729     Intake/Output Summary (Last 24 hours) at 10/13/2021 0728 Last data filed at 10/13/2021 0600 Gross per 24 hour  Intake 1510 ml  Output 650 ml  Net 860 ml     Assessment/Plan:  This is a 66 y.o. male who is s/p left TCAR 1 Day Post-Op  -pt is doing well this am. -pt neuro exam is in tact -renal function looks good this am -pt has ambulated -pt has voided -f/u with Dr. Donnetta Hutching in 4 weeks with carotid duplex   Leontine Locket, PA-C [facility] [phone]

## 2021-10-14 LAB — POCT ACTIVATED CLOTTING TIME: Activated Clotting Time: 335 seconds

## 2021-10-15 ENCOUNTER — Encounter (HOSPITAL_COMMUNITY): Payer: Self-pay | Admitting: Vascular Surgery

## 2021-10-16 ENCOUNTER — Ambulatory Visit: Payer: Medicare HMO | Admitting: Gastroenterology

## 2021-10-16 ENCOUNTER — Ambulatory Visit: Payer: Medicare HMO | Admitting: Internal Medicine

## 2021-10-18 NOTE — Progress Notes (Signed)
Primary Care Physician:  Pcp, No  Primary GI: Dr. Gala Romney  Patient Location: Home   Provider Location: [place] office   Reason for Visit: Follow-up   Persons present on the virtual encounter, with roles: Aliene Altes, PA-C (Provider), Jimmy Duran (patient)   Total time (minutes) spent on medical discussion: 9 minutes  Virtual Visit via video Note Due to COVID-19, visit is conducted virtually and was requested by patient.   I connected with Jimmy Duran on 10/19/21 at  9:30 AM EDT by video and verified that I am speaking with the correct person using two identifiers.   I discussed the limitations, risks, security and privacy concerns of performing an evaluation and management service by video and the availability of in person appointments. I also discussed with the patient that there may be a patient responsible charge related to this service. The patient expressed understanding and agreed to proceed.  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  Last seen via video visit 05/08/2021 reporting frequent indigestion and ongoing dysphagia to solids and liquids.  He was on Protonix twice daily.  Recommended adding Pepcid 20 mg twice daily as needed and pursuing BPE.  Also counseled on aspirin powder cessation.  BPE: Smooth narrowing at the GE junction, obstructing 12.5 mm barium tablet.  Age-related dysmotility.  EGD completed 07/12/2021: Cobblestoning appearance of distal esophagus, slight narrowing of distal esophagus s/p dilation with 24 Fr and biopsy, gastric erosions biopsied, normal examined duodenum.  Esophageal biopsy  with reactive squamous mucosa, gastric biopsy with reactive gastropathy, negative for H. pylori.   Today:  Dysphagia has resolved.   Indigestion not as bad, but still with breakthrough daily. Some days are worse than others. Taking pantoprazole and pepcid BID.  Denies nausea, vomiting, abdominal pain, BRBPR, melena.   States he has cut back on BC powders. Still taking 2-3 a day a few days a week for headache. Has HA's daily.  States he discussed this with his last primary medical doctor who never listened to him.  He recently established with a new primary care doctor and has only seen her once, but has a follow-up in October.  He feels that she is going to be more receptive and is hopeful that she will have recommendations for his headaches.  A1C 8.4 in July 2023.    Past Medical History:  Diagnosis Date   AAA (abdominal aortic aneurysm) (HCC)    Arthritis    Carotid artery disease (HCC)    Colon polyp    COPD (chronic obstructive pulmonary disease) ([place])    Coronary artery disease    Nonobstructive   Essential hypertension    GERD (gastroesophageal reflux disease)    Hemorrhoids    History of TIA (transient ischemic attack)    Low back pain    Lumbar radiculopathy    Mixed hyperlipidemia    Palpitations    Peripheral vascular disease ([place])    Shingles 09/23/2019   Type 2 diabetes mellitus ([place])      Past Surgical History:  Procedure Laterality Date   APPENDECTOMY  1970   BIOPSY  12/08/2017   Procedure: BIOPSY;  Surgeon:  [name], MD;  [facility];  Service: Endoscopy;;  esophagus    BIOPSY  12/28/2020   Procedure: BIOPSY;  Surgeon: [name], MD;  [facility];  Service: Endoscopy;;   BIOPSY  07/12/2021   Procedure: BIOPSY;  Surgeon: [name], MD;  [facility];  Service: Endoscopy;;   BREAST SURGERY Right    Cyst resection on the right   CARDIAC CATHETERIZATION  1990's   in [place]. no stent placement    CATARACT EXTRACTION W/PHACO Right 06/18/2016   Procedure: CATARACT EXTRACTION PHACO AND INTRAOCULAR LENS PLACEMENT ([place]);  Surgeon: [name], MD;  Location: [facility];  Service: Ophthalmology;  Laterality: Right;  [name] 35.7 AP% 16.6 CDE 5.94 Fluid pack lot # [id number]   CATARACT EXTRACTION W/PHACO Left 07/16/2016   Procedure: CATARACT EXTRACTION PHACO AND INTRAOCULAR LENS PLACEMENT (IOC);  Surgeon: [name], MD;  Location: [facility];  Service: Ophthalmology;  Laterality: Left;  [name] 51.8 AP% 12.7 CDE 6.58 Fluid Pack Lot # [id number]   CHOLECYSTECTOMY  1993   COLONOSCOPY  02/12/2007   IEP:PIRJJOACZ friable anal canal hemorrhoids, otherwise normal rectum and colon   COLONOSCOPY N/A 07/16/2012   RMR: Colonic diverticulosis. 4 mm tubular adenoma   COLONOSCOPY WITH PROPOFOL N/A 12/08/2017   Dr. Gala Romney: sigmoid and descending colon diverticulosis, transverse colon polyp (TUBULAR ADENOMA)   ENDARTERECTOMY Right 10/08/2019   Procedure: RIGHT CAROTID ENDARTERECTOMY;  Surgeon: Rosetta Posner, MD;  [facility];  Service: Vascular;  Laterality: Right;   ESOPHAGOGASTRODUODENOSCOPY (EGD) WITH ESOPHAGEAL DILATION N/A 07/16/2012   RMR: +Candida esophagitis, Erosive reflux esophagiits. Schatizi's ring status post dilation. Hiatal hernia. Antral erosions status post biopsy.    ESOPHAGOGASTRODUODENOSCOPY (EGD) WITH PROPOFOL N/A 12/08/2017   Dr. Gala Romney: esophagitis, query EOE but negative for increased eosinophils on path, small hiatal hernia, normal stomach, normal duodenum, PAS stain with rare yeast forms on stain. Empiric dilation   ESOPHAGOGASTRODUODENOSCOPY (EGD) WITH PROPOFOL N/A 12/28/2020   esophagitis without bleeding, s/p biopsy and dilation. Antral erosions s/p biopsy.   ESOPHAGOGASTRODUODENOSCOPY (EGD) WITH PROPOFOL N/A 07/12/2021   Surgeon: [name], MD;  Cobblestoning appearance of distal esophagus, slight narrowing of distal esophagus s/p dilation with 18 Fr and biopsy, gastric  erosions biopsied, normal examined duodenum.  Esophageal biopsy with reactive squamous mucosa, gastric biopsy with reactive gastropathy, negative for H. pylori.   EYE SURGERY Bilateral 2018   cataract   HERNIA REPAIR     JOINT REPLACEMENT     KNEE SURGERY     Left knee arthroscopy torn medial meniscus grade 4 chondral changes medial femoral condyle tibial plateau   MALONEY DILATION N/A 12/08/2017   Procedure: MALONEY DILATION;  Surgeon: [name], MD;  [facility];  Service: Endoscopy;  Laterality: N/A;   MALONEY DILATION N/A 12/28/2020   Procedure: Venia Minks DILATION;  Surgeon: [name], MD;  [facility];  Service: Endoscopy;  Laterality: N/A;   MALONEY DILATION N/A 07/12/2021   Procedure: Venia Minks DILATION;  Surgeon: [name], MD;  [facility];  Service: Endoscopy;  Laterality: N/A;   PATCH ANGIOPLASTY Right 10/08/2019   Procedure: PATCH ANGIOPLASTY of the right common carotid artery using hemashield plaltinum finesse patch;  Surgeon: Rosetta Posner, MD;  Location: [facility];  Service: Vascular;  Laterality: Right;   PERIPHERAL VASCULAR INTERVENTION  07/17/2020   Procedure: PERIPHERAL VASCULAR INTERVENTION;  Surgeon: Waynetta Sandy, MD;  Location: [facility];  Service: Cardiovascular;;   POLYPECTOMY  12/08/2017   Procedure: POLYPECTOMY;  Surgeon: [name], MD;  [facility];  Service: Endoscopy;;  colon   TOTAL KNEE ARTHROPLASTY Left 04/27/2012   Procedure: TOTAL KNEE ARTHROPLASTY;  Surgeon: Carole Civil, MD;  [facility]: [facility];  Service: Orthopedics;  Laterality: Left;   TRANSCAROTID ARTERY REVASCULARIZATION  Left 10/12/2021   Procedure: Left Transcarotid Artery Revascularization;  Surgeon: Waynetta Sandy, MD;  Location: [place];  Service: Vascular;  Laterality: Left;   [place] N/A 07/17/2020   Procedure: mesenteric ANGIOGRAPHY;  Surgeon: Waynetta Sandy, MD;  Location: [place] [facility];  Service: Cardiovascular;  Laterality: N/A;     Current Meds  Medication Sig   albuterol (VENTOLIN HFA) 108 (90 Base) MCG/ACT inhaler Inhale 2 puffs into the lungs every 6 (six) hours as needed for wheezing or shortness of breath.   aspirin EC 81 MG tablet Take 1 tablet (81 mg total) by mouth daily at 6 (six) AM. Swallow whole.   Aspirin-Caffeine (BC FAST PAIN RELIEF PO) Take 1 packet by mouth daily as needed (pain).   buPROPion (WELLBUTRIN SR) 150 MG 12 hr tablet Take 150 mg by mouth 2 (two) times daily.   clopidogrel (PLAVIX) 75 MG tablet Take 1 tablet (75 mg total) by mouth daily.   diphenhydrAMINE (BENADRYL) 25 MG tablet Take 25 mg by mouth daily as needed for allergies.    famotidine (PEPCID) 20 MG tablet Take 1 tablet (20 mg total) by mouth 2 (two) times daily.   insulin NPH Human (NOVOLIN N) 100 UNIT/ML injection Inject 25 Units into the skin in the morning and at bedtime.   lansoprazole (PREVACID) 30 MG capsule Take 1 capsule (30 mg total) by mouth 2 (two) times daily before a meal.   lisinopril-hydrochlorothiazide (ZESTORETIC) 20-12.5 MG tablet Take 1 tablet by mouth once daily   metFORMIN (GLUCOPHAGE) 1000 MG tablet Take 1 tablet (1,000 mg total) by mouth 2 (two) times daily with a meal.   metoprolol tartrate (LOPRESSOR) 25 MG tablet Take 1 tablet by mouth twice daily   rosuvastatin (CRESTOR) 40 MG tablet Take 1 tablet (40 mg total) by mouth daily.   [DISCONTINUED] pantoprazole (PROTONIX) 40 MG tablet TAKE 1 TABLET BY MOUTH TWICE DAILY BEFORE A MEAL     Family History  Problem Relation Age of Onset   Dementia Mother    Hypertension Mother    Colon polyps Mother    Osteoporosis Mother    Cancer Father    Hypertension Father    Coronary artery disease Father        CABG in his 58's   Heart attack Father    Cancer - Lung Father    Heart disease Brother        has a pacemaker   Emphysema Maternal Grandmother    Cancer  - Lung Maternal Grandfather    Cancer Paternal Grandmother    Heart attack Paternal Grandfather    Colon cancer Other        maternal great aunt   Colon cancer Other        paternal great aunt    Social History   Socioeconomic History   Marital status: Legally Separated    Spouse name: Not on file   Number of children: 5   Years of education: Not on file   Highest education level: Not on file  Occupational History   Occupation: Warehouse manager  Employer: [employer]  Tobacco Use   Smoking status: Every Day    Packs/day: 1.00    Years: 53.00    Total pack years: 53.00    Types: Cigarettes   Smokeless tobacco: Former    Types: Chew   Tobacco comments:    0.5 pk per day currently   Vaping Use   Vaping Use: Former  Substance and Sexual Activity   Alcohol use: Not Currently    Alcohol/week: 0.0 standard drinks of alcohol    Comment: socially "beer and tomato juice"    Drug use: No   Sexual activity: Yes    Birth control/protection: None  Other Topics Concern   Not on file  Social History Narrative   Lives alone.    Social Determinants of Health   Financial Resource Strain: Not on file  Food Insecurity: Not on file  Transportation Needs: Not on file  Physical Activity: Not on file  Stress: Not on file  Social Connections: Not on file       Review of Systems: Gen: Denies fever, chills, cold or flu like symptoms, pre-syncope, or syncope.  CV: Denies chest pain, palpitations. Resp: Denies dyspnea, cough.  GI: see HPI Heme: See HPI  Observations/Objective: No distress. Alert and oriented. Pleasant. Well nourished. Normal mood and affect. Unable to perform complete physical exam due to video encounter.   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  GERD:  Chronic.  Continues with breakthrough symptoms daily on Protonix 40 mg twice daily and Pepcid 20 mg twice daily.  Denies ever trying any other medications though options are limited in the setting of Plavix.  Chronic BC powder use for headaches likely contributing to breakthrough symptoms.  Recent EGD in May 2023 with, cobblestone appearance of distal esophagus with slight narrowing, gastric erosions.  Esophageal biopsy with reactive squamous mucosa.  Gastric biopsy with reactive gastropathy, negative for H. pylori.  We we will try changing his PPI to see if this provides any additional symptom improvement, but he was counseled extensively on the fact that frequent use of BC powders is likely contributing to his refractory symptoms.  Encouraged him to limit BCs as much as possible and follow-up with his PCP on chronic headaches.  Dysphagia:  Resolved.  BPE revealed smooth narrowing at the GE junction obstructing 12.5 mm barium tablet, age-related dysmotility.  EGD 07/12/2021 with cobblestone appearance of distal esophagus, slight narrowing of distal esophagus s/p dilation with 56 fr and biopsy.  Biopsies revealed reactive squamous mucosa.  Plan: Stop Protonix and start Prevacid 30 mg twice daily 30 minutes before breakfast and dinner.  New prescription sent to pharmacy. May continue to use Pepcid 20 mg twice daily as needed. Counseled on the importance of avoiding all NSAID products, specifically discontinuing BC powders. Reinforced GERD diet/lifestyle.  Separate written instructions provided.  See AVS. Follow-up with PCP on management of headaches.  May need neurology referral. Follow-up in 3 months or sooner if needed.     I discussed the assessment and treatment plan with the patient. The patient was provided an opportunity to ask questions and all were answered. The patient agreed with the plan and demonstrated an  understanding of the instructions.   The patient was advised to call  back or seek an in-person evaluation if the symptoms worsen or if the condition fails to improve as anticipated.  I provided 9 minutes of video-face-to-face time during this encounter.  Aliene Altes, PA-C [facility]  10/19/2021

## 2021-10-19 ENCOUNTER — Telehealth: Payer: Self-pay | Admitting: Gastroenterology

## 2021-10-19 ENCOUNTER — Telehealth (INDEPENDENT_AMBULATORY_CARE_PROVIDER_SITE_OTHER): Payer: Medicare HMO | Admitting: Gastroenterology

## 2021-10-19 ENCOUNTER — Encounter: Payer: Self-pay | Admitting: Gastroenterology

## 2021-10-19 VITALS — Ht 67.0 in | Wt 140.0 lb

## 2021-10-19 DIAGNOSIS — K219 Gastro-esophageal reflux disease without esophagitis: Secondary | ICD-10-CM

## 2021-10-19 DIAGNOSIS — R1319 Other dysphagia: Secondary | ICD-10-CM

## 2021-10-19 MED ORDER — LANSOPRAZOLE 30 MG PO CPDR: 30.0000 mg | DELAYED_RELEASE_CAPSULE | Freq: Two times a day (BID) | ORAL | 3 refills | Status: DC

## 2021-10-19 NOTE — Telephone Encounter (Signed)
Jimmy Duran, patient needs 45-monthfollow-up with ARoseanne Kaufman NP.

## 2021-10-19 NOTE — Patient Instructions (Signed)
Stop Protonix and start Prevacid 30 mg twice daily 30 minutes before breakfast and dinner.  You may continue to use Pepcid 20 mg twice daily as needed.  As we discussed, it is important to avoid all NSAID products.  This includes BC powders, Goody powders, ibuprofen, Aleve, Advil, and anything that says "NSAID" on the package.  These medications are going to worsen your reflux symptoms.  I recommend that you discuss your headaches with your primary care doctor to see if they have any prescriptive medication recommendations as you are having headaches daily.  They may want to refer you to neurology for further evaluation.  Follow a GERD diet:  Avoid fried, fatty, greasy, spicy, citrus foods. Avoid caffeine and carbonated beverages. Avoid chocolate. Try eating 4-6 small meals a day rather than 3 large meals. Do not eat within 3 hours of laying down. Prop head of bed up on wood or bricks to create a 6 inch incline.  We will plan to follow-up with you in 3 months.  Do not hesitate to call sooner if needed.  It was very nice meeting you today!  Aliene Altes, PA-C [facility]

## 2021-10-27 ENCOUNTER — Other Ambulatory Visit: Payer: Self-pay

## 2021-10-27 ENCOUNTER — Emergency Department (HOSPITAL_COMMUNITY)
Admission: EM | Admit: 2021-10-27 | Discharge: 2021-10-27 | Disposition: A | Discharge status: Home / Self Care | Payer: Medicare HMO | Attending: Emergency Medicine | Admitting: Emergency Medicine

## 2021-10-27 ENCOUNTER — Encounter (HOSPITAL_COMMUNITY): Payer: Self-pay | Admitting: Emergency Medicine

## 2021-10-27 DIAGNOSIS — Z7982 Long term (current) use of aspirin: Secondary | ICD-10-CM | POA: Insufficient documentation

## 2021-10-27 DIAGNOSIS — Z794 Long term (current) use of insulin: Secondary | ICD-10-CM | POA: Diagnosis not present

## 2021-10-27 DIAGNOSIS — Z79899 Other long term (current) drug therapy: Secondary | ICD-10-CM | POA: Insufficient documentation

## 2021-10-27 DIAGNOSIS — R42 Dizziness and giddiness: Secondary | ICD-10-CM | POA: Insufficient documentation

## 2021-10-27 DIAGNOSIS — I10 Essential (primary) hypertension: Secondary | ICD-10-CM | POA: Insufficient documentation

## 2021-10-27 DIAGNOSIS — Z7902 Long term (current) use of antithrombotics/antiplatelets: Secondary | ICD-10-CM | POA: Diagnosis not present

## 2021-10-27 LAB — BASIC METABOLIC PANEL
Anion gap: 10 (ref 5–15)
BUN: 20 mg/dL (ref 8–23)
CO2: 23 mmol/L (ref 22–32)
Calcium: 8.9 mg/dL (ref 8.9–10.3)
Chloride: 100 mmol/L (ref 98–111)
Creatinine, Ser: 1.63 mg/dL — ABNORMAL HIGH (ref 0.61–1.24)
GFR, Estimated: 47 mL/min — ABNORMAL LOW (ref >60)
Glucose, Bld: 272 mg/dL — ABNORMAL HIGH (ref 70–99)
Potassium: 3.6 mmol/L (ref 3.5–5.1)
Sodium: 133 mmol/L — ABNORMAL LOW (ref 135–145)

## 2021-10-27 LAB — CBC
HCT: 32.3 % — ABNORMAL LOW (ref 39.0–52.0)
Hemoglobin: 11.1 g/dL — ABNORMAL LOW (ref 13.0–17.0)
MCH: 32.4 pg (ref 26.0–34.0)
MCHC: 34.4 g/dL (ref 30.0–36.0)
MCV: 94.2 fL (ref 80.0–100.0)
Platelets: 398 10*3/uL (ref 150–400)
RBC: 3.43 MIL/uL — ABNORMAL LOW (ref 4.22–5.81)
RDW: 13.8 % (ref 11.5–15.5)
WBC: 8.6 10*3/uL (ref 4.0–10.5)
nRBC: 0 % (ref 0.0–0.2)

## 2021-10-27 LAB — URINALYSIS, ROUTINE W REFLEX MICROSCOPIC
Bacteria, UA: NONE SEEN
Bilirubin Urine: NEGATIVE
Glucose, UA: >=500 mg/dL — AB
Ketones, ur: NEGATIVE mg/dL
Leukocytes,Ua: NEGATIVE
Nitrite: NEGATIVE
Protein, ur: 30 mg/dL — AB
Specific Gravity, Urine: 1.008 (ref 1.005–1.030)
pH: 6 (ref 5.0–8.0)

## 2021-10-27 MED ORDER — SODIUM CHLORIDE 0.9 % IV BOLUS: 1000.0000 mL | Freq: Once | INTRAVENOUS | Status: DC

## 2021-10-27 NOTE — ED Triage Notes (Signed)
Pt to the ED with dizziness for the past three weeks with 3 recent falls.  Pt is on Plavix 75 mg a day. Pt denies hitting hie head or loosing consciousness with falls.  Pt had a recent left carotid artery surgery.

## 2021-10-27 NOTE — Discharge Instructions (Addendum)
Some of the lightheaded symptoms you are experiencing may be related to your blood pressure being too low at home.  I would recommend that you cut your metoprolol dose in half, taking 12.5 mg in the morning and at night, for the next several days.  Please check your blood pressure every day in the morning and at night and keep a daily diary with your blood pressures.  Call your cardiologist or your prescribing blood pressure doctor, to discuss your blood pressure regimen.

## 2021-10-27 NOTE — ED Provider Notes (Signed)
[facility] EMERGENCY DEPARTMENT Provider Note   CSN: [id number] Arrival date & time: 10/27/21  1509     History {Add pertinent medical, surgical, social history, OB history to HPI:1} Chief Complaint  Patient presents with   Dizziness    Jimmy Duran is a 66 y.o. male presenting from home with concern for lightheadedness.  The patient ports been on for several months.  He says that he will begin to feel lightheaded both when standing up, also sometimes when sitting down.  He denies vertigo.  He denies loss of consciousness.  He denies striking his head.  He did have a left carotid procedure performed 2 weeks ago and has been compliant with his Plavix.  He denies any new headache.  He reports he typically does have high blood pressure and this is tightly regulated by his doctor, he takes lisinopril 20 mg which HCTZ combination 12.5, as well as metoprolol 25 mg twice daily.  HPI     Home Medications Prior to Admission medications   Medication Sig Start Date End Date Taking? Authorizing Provider  albuterol (VENTOLIN HFA) 108 (90 Base) MCG/ACT inhaler Inhale 2 puffs into the lungs every 6 (six) hours as needed for wheezing or shortness of breath. 08/09/21   Renee Rival, FNP  aspirin EC 81 MG tablet Take 1 tablet (81 mg total) by mouth daily at 6 (six) AM. Swallow whole. 10/14/21   Rhyne, Hulen Shouts, PA-C  Aspirin-Caffeine (BC FAST PAIN RELIEF PO) Take 1 packet by mouth daily as needed (pain).    [provider]  buPROPion (WELLBUTRIN SR) 150 MG 12 hr tablet Take 150 mg by mouth 2 (two) times daily.    [provider]  clopidogrel (PLAVIX) 75 MG tablet Take 1 tablet (75 mg total) by mouth daily. 08/09/21 08/09/22  Renee Rival, FNP  diphenhydrAMINE (BENADRYL) 25 MG tablet Take 25 mg by mouth daily as needed for allergies.     [provider]  famotidine (PEPCID) 20 MG tablet Take 1 tablet (20 mg total) by mouth 2 (two) times daily. 05/08/21   Annitta Needs, NP  insulin NPH Human (NOVOLIN N) 100 UNIT/ML injection Inject 25 Units into the skin in the morning and at bedtime.    [provider]  lansoprazole (PREVACID) 30 MG capsule Take 1 capsule (30 mg total) by mouth 2 (two) times daily before a meal. 10/19/21   Erenest Rasher, PA-C  lisinopril-hydrochlorothiazide (ZESTORETIC) 20-12.5 MG tablet Take 1 tablet by mouth once daily 06/06/21   Satira Sark, MD  metFORMIN (GLUCOPHAGE) 1000 MG tablet Take 1 tablet (1,000 mg total) by mouth 2 (two) times daily with a meal. 09/11/21   [place], Dewaine Conger, FNP  metoprolol tartrate (LOPRESSOR) 25 MG tablet Take 1 tablet by mouth twice daily 06/06/21   Satira Sark, MD  rosuvastatin (CRESTOR) 40 MG tablet Take 1 tablet (40 mg total) by mouth daily. 08/14/21   Satira Sark, MD      Allergies    Patient has no known allergies.    Review of Systems   Review of Systems  Physical Exam Updated Vital Signs BP 115/68 (BP Location: Right Arm)   Pulse 82   Temp 98.4 F (36.9 C) (Oral)   Resp 19   Ht '5\' 7"'$  (1.702 m)   Wt 63.5 kg   SpO2 93%   BMI 21.93 kg/m  Physical Exam Constitutional:      General: He is not in acute  distress. HENT:     Head: Normocephalic and atraumatic.     Right Ear: Tympanic membrane and ear canal normal.     Left Ear: Tympanic membrane and ear canal normal.  Eyes:     Conjunctiva/sclera: Conjunctivae normal.     Pupils: Pupils are equal, round, and reactive to light.  Cardiovascular:     Rate and Rhythm: Normal rate and regular rhythm.  Pulmonary:     Effort: Pulmonary effort is normal. No respiratory distress.  Abdominal:     General: There is no distension.     Tenderness: There is no abdominal tenderness.  Skin:    General: Skin is warm and dry.  Neurological:     General: No focal deficit present.     Mental Status: He is alert and oriented to person, place, and time. Mental status is at baseline.     Sensory: No sensory deficit.     Motor:  No weakness.  Psychiatric:        Mood and Affect: Mood normal.        Behavior: Behavior normal.     ED Results / Procedures / Treatments   Labs (all labs ordered are listed, but only abnormal results are displayed) Labs Reviewed  BASIC METABOLIC PANEL - Abnormal; Notable for the following components:      Result Value   Sodium 133 (*)    Glucose, Bld 272 (*)    Creatinine, Ser 1.63 (*)    GFR, Estimated 47 (*)    All other components within normal limits  CBC - Abnormal; Notable for the following components:   RBC 3.43 (*)    Hemoglobin 11.1 (*)    HCT 32.3 (*)    All other components within normal limits  URINALYSIS, ROUTINE W REFLEX MICROSCOPIC    EKG None  Radiology No results found.  Procedures Procedures  {Document cardiac monitor, telemetry assessment procedure when appropriate:1}  Medications Ordered in ED Medications - No data to display  ED Course/ Medical Decision Making/ A&P                           Medical Decision Making Amount and/or Complexity of Data Reviewed Labs: ordered.   This patient presents to the ED with concern for lightheadedness, dizziness, ongoing for several months. This involves an extensive number of treatment options, and is a complaint that carries with it a high risk of complications and morbidity.  The differential diagnosis includes orthostatic hypotension versus dehydration versus arrhythmia versus vascular issue versus other   Co-morbidities that complicate the patient evaluation: history of high blood pressure multiple blood pressure medications but high risk for iatrogenic hypotension  Additional history obtained from patient's wife at bedside  External records from outside source obtained and reviewed including ***  I ordered and personally interpreted labs.  The pertinent results include: Creatinine mildly elevated above baseline  Patient symptoms been going on before he had his carotid procedure, and this does not  appear consistent with a carotid insufficiency, I do not believe he needs emergent CTA of the neck or vascular imaging.  He ready has follow-up with the vascular surgeon this week.  He also did not strike his head, has no headache or stroke symptoms, notes indication for CT imaging of the brain.  The patient was maintained on a cardiac monitor.  I personally viewed and interpreted the cardiac monitored which showed an underlying rhythm of: Sinus rhythm  Per my  interpretation the patient's ECG shows normal sinus rhythm no acute ischemic findings  I ordered medication including IV fluid bolus for hydration for some elevation in creatinine which may be due to poor p.o. intake.  I have reviewed the patients home medicines and have made adjustments as needed -I recommended that he have his dose of metoprolol from 25 mg twice daily to 12.5.  Have a pill cutter at home.  They can follow-up with her prescribing doctor for this.  Test Considered: Low suspicion for aortic dissection, pulmonary embolism, stroke, or other vascular emergency, based on his clinical presentation.  After the interventions noted above, I reevaluated the patient and found that they have: stayed the same  Social Determinants of Health:Advised the patient keep himself very hydrated at home with water  Dispostion:  After consideration of the diagnostic results and the patients response to treatment, I feel that the patent would benefit from ***.   {Document critical care time when appropriate:1} {Document review of labs and clinical decision tools ie heart score, Chads2Vasc2 etc:1}  {Document your independent review of radiology images, and any outside records:1} {Document your discussion with family members, caretakers, and with consultants:1} {Document social determinants of health affecting pt's care:1} {Document your decision making why or why not admission, treatments were needed:1} Final Clinical Impression(s) / ED  Diagnoses Final diagnoses:  None    Rx / DC Orders ED Discharge Orders     None

## 2021-10-27 NOTE — ED Notes (Signed)
Pt became very verbally aggressive at the length of time it took for his discharge.

## 2021-10-29 ENCOUNTER — Telehealth: Payer: Self-pay | Admitting: Cardiology

## 2021-10-29 MED ORDER — METOPROLOL TARTRATE 25 MG PO TABS: 12.5000 mg | ORAL_TABLET | Freq: Two times a day (BID) | ORAL | Status: DC

## 2021-10-29 NOTE — Telephone Encounter (Signed)
Spoke with wife Jimmy Duran) - states that is dizziness is still occurring occasionally, used to be daily.  States the ED doc said it was not vertigo or inner ear issue.  States that she held his Lopressor on Sunday, but did give the Lisinopril.  On Saturday, she give the Lopressor '25mg'$  and the Lisinopril.  BP check this am - 152/81.  Did not have any other readings to give.  No other c/o chest pain or sob.    Per ED note - I have reviewed the patients home medicines and have made adjustments as needed -I recommended that he have his dose of metoprolol from 25 mg twice daily to 12.5.  Have a pill cutter at home.  They can follow-up with her prescribing doctor for this.  Above is unclear - did they mean 12.'5mg'$  daily or twice a day ?    Yearly check is not due till February 2024 for his 1 year.  Is seeing a new pcp in October.

## 2021-10-29 NOTE — Telephone Encounter (Signed)
  Pt's wife calling, she said, pt was at [facility] ED this weekend and was told it might have been one of his meds making his BP elevated. They would like to ask Dr. Domenic Polite if pt medication needs to be adjusted

## 2021-10-29 NOTE — Telephone Encounter (Signed)
Wife Estill Bamberg) notified.

## 2021-10-30 ENCOUNTER — Encounter: Payer: Self-pay | Admitting: *Deleted

## 2021-10-30 NOTE — Telephone Encounter (Signed)
Appointment scheduled.

## 2021-11-09 ENCOUNTER — Ambulatory Visit: Payer: Medicare HMO | Admitting: Cardiology

## 2021-11-09 ENCOUNTER — Other Ambulatory Visit: Payer: Self-pay | Admitting: *Deleted

## 2021-11-09 DIAGNOSIS — I6523 Occlusion and stenosis of bilateral carotid arteries: Secondary | ICD-10-CM

## 2021-11-12 NOTE — Telephone Encounter (Signed)
OV made °

## 2021-11-14 ENCOUNTER — Ambulatory Visit (INDEPENDENT_AMBULATORY_CARE_PROVIDER_SITE_OTHER): Payer: Medicare HMO | Admitting: Vascular Surgery

## 2021-11-14 ENCOUNTER — Ambulatory Visit (INDEPENDENT_AMBULATORY_CARE_PROVIDER_SITE_OTHER): Payer: Medicare HMO

## 2021-11-14 ENCOUNTER — Encounter: Payer: Self-pay | Admitting: Vascular Surgery

## 2021-11-14 VITALS — BP 132/82 | HR 94 | Temp 97.9°F | Ht 67.0 in | Wt 133.6 lb

## 2021-11-14 DIAGNOSIS — I6523 Occlusion and stenosis of bilateral carotid arteries: Secondary | ICD-10-CM

## 2021-11-14 NOTE — Progress Notes (Signed)
[facility] [place]  Patient name: Jimmy Duran MRN: [id number] DOB: 06/02/1955 Sex: male  REASON FOR VISIT: Follow-up TCAR left carotid  HPI: Jimmy Duran is a 66 y.o. male here today for follow-up.  He has a past history of right carotid endarterectomy by myself in 2021.  He recently underwent left TCAR with Dr. Donzetta Matters on 10/12/2021.  He was discharged home on postoperative day #1.  He reports that he has been lightheaded following surgery and actually is following on several occasions.  He was seen in the emergency room for evaluation of this as well.  It was felt that may be related to blood pressure.  He has had no focal deficits.  Current Outpatient Medications  Medication Sig Dispense Refill   albuterol (VENTOLIN HFA) 108 (90 Base) MCG/ACT inhaler Inhale 2 puffs into the lungs every 6 (six) hours as needed for wheezing or shortness of breath. 8 g 2   aspirin EC 81 MG tablet Take 1 tablet (81 mg total) by mouth daily at 6 (six) AM. Swallow whole. 30 tablet 12   Aspirin-Caffeine (BC FAST PAIN RELIEF PO) Take 1 packet by mouth daily as needed (pain).     buPROPion (WELLBUTRIN SR) 150 MG 12 hr tablet Take 150 mg by mouth 2 (two) times daily.     clopidogrel (PLAVIX) 75 MG tablet Take 1 tablet (75 mg total) by mouth daily. 30 tablet 11   diphenhydrAMINE (BENADRYL) 25 MG tablet Take 25 mg by mouth daily as needed for allergies.      famotidine (PEPCID) 20 MG tablet Take 1 tablet (20 mg total) by mouth 2 (two) times daily. 60 tablet 3   insulin NPH Human (NOVOLIN N) 100 UNIT/ML injection Inject 25 Units into the skin in the morning and at bedtime.     lansoprazole (PREVACID) 30 MG capsule Take 1 capsule (30 mg total) by mouth 2 (two) times daily before a meal. 60 capsule 3   lisinopril-hydrochlorothiazide (ZESTORETIC) 20-12.5 MG tablet Take 1 tablet by mouth once daily 90 tablet 3   metFORMIN (GLUCOPHAGE) 1000 MG tablet Take 1  tablet (1,000 mg total) by mouth 2 (two) times daily with a meal. 60 tablet 3   metoprolol tartrate (LOPRESSOR) 25 MG tablet Take 0.5 tablets (12.5 mg total) by mouth 2 (two) times daily.     rosuvastatin (CRESTOR) 40 MG tablet Take 1 tablet (40 mg total) by mouth daily. 90 tablet 3   No current facility-administered medications for this visit.     PHYSICAL EXAM: Vitals:   11/14/21 1309 11/14/21 1313  BP: 118/76 132/82  Pulse: 94   Temp: 97.9 F (36.6 C)   SpO2: 96%   Weight: 133 lb 9.6 oz (60.6 kg)   Height: '5\' 7"'$  (1.702 m)     GENERAL: The patient is a well-nourished male, in no acute distress. The vital signs are documented above. Left neck incision is well-healed.  No bruits noted bilaterally.  Carotid duplex today shows good flow velocities in the left carotid stent and right carotid endarterectomy.  He has antegrade flow in his vertebral arteries bilaterally  MEDICAL ISSUES: Stable status post left TCAR.  We will continue to monitor his blood pressure and lightheadedness.  We will see him again in 9 months with repeat carotid duplex   Rosetta Posner, MD FACS [facility] [phone]  Note: Portions of this report may have been transcribed using voice recognition software.  Every effort has been made to ensure accuracy; however, inadvertent computerized transcription errors may still be present.

## 2021-11-16 ENCOUNTER — Other Ambulatory Visit: Payer: Self-pay

## 2021-11-16 DIAGNOSIS — I6523 Occlusion and stenosis of bilateral carotid arteries: Secondary | ICD-10-CM

## 2021-11-22 ENCOUNTER — Ambulatory Visit: Payer: Medicare HMO | Attending: Student | Admitting: Student

## 2021-11-22 ENCOUNTER — Encounter: Payer: Self-pay | Admitting: Student

## 2021-11-22 VITALS — BP 100/60 | HR 99 | Ht 67.0 in | Wt 138.4 lb

## 2021-11-22 DIAGNOSIS — R42 Dizziness and giddiness: Secondary | ICD-10-CM

## 2021-11-22 DIAGNOSIS — I739 Peripheral vascular disease, unspecified: Secondary | ICD-10-CM

## 2021-11-22 DIAGNOSIS — I2510 Atherosclerotic heart disease of native coronary artery without angina pectoris: Secondary | ICD-10-CM

## 2021-11-22 DIAGNOSIS — I10 Essential (primary) hypertension: Secondary | ICD-10-CM

## 2021-11-22 DIAGNOSIS — E785 Hyperlipidemia, unspecified: Secondary | ICD-10-CM

## 2021-11-22 DIAGNOSIS — R634 Abnormal weight loss: Secondary | ICD-10-CM

## 2021-11-22 MED ORDER — LISINOPRIL 20 MG PO TABS: 20.0000 mg | ORAL_TABLET | Freq: Every day | ORAL | 3 refills | Status: DC

## 2021-11-22 NOTE — Progress Notes (Signed)
Cardiology Office Note    Date:  11/22/2021   ID:  Jimmy Duran, DOB September 20, 1955, MRN [id number]  PCP:  Laural Benes, MD  Cardiologist: [name], MD    Chief Complaint  Patient presents with   Follow-up    Recent Emergency Dept visit    History of Present Illness:    Jimmy Duran is a 66 y.o. male with past medical history of CAD (nonobstructive CAD by review of notes and low-risk NST in 2015), HTN, HLD, Type 2 DM, PAD (s/p stenting to superior mesenteric artery in 07/2020) and carotid artery stenosis (s/p R CEA in 2021) who presents to the office today for follow-up from a recent Emergency Department visit.   He was last examined by Dr. Domenic Polite in 04/2021 and reported having NYHA Class 2 dyspnea but no chest pain or palpitations. He was continued on his current cardiac medications including Plavix 75 mg daily, Lisinopril-HCTZ 20-12.5 mg daily, Lopressor 25 mg twice daily and Crestor 40 mg daily.  He was admitted to [facility] in 10/2021 for planned left TCAR and no immediate complications were noted. Was discharged home on 10/13/2021. In the interim, he presented to [facility] ED on 10/27/2021 for evaluation of worsening dizziness for the past 3 weeks. He did report poor PO intake and he received IV fluids. His Lopressor was reduced from 25 mg twice daily to 12.5 mg twice daily and outpatient follow-up with Cardiology was recommended.  In talking with the patient and his wife today, he reports worsening lightheadedness over the past month and says this can occur while sitting or with ambulation. Says his legs feel weak intermittently and he has experienced occasional falls. He denies any recent chest pain or palpitations. Reports his respiratory status has been stable with no specific orthopnea, PND or pitting edema. He has lost over 40 pounds within the past year and this has been an unintentional weight loss. He is scheduled to establish with his new PCP later this month. He  did have a colonoscopy in 2019 and is due for repeat screening next year.  Past Medical History:  Diagnosis Date   AAA (abdominal aortic aneurysm) (HCC)    Arthritis    Carotid artery disease (HCC)    Colon polyp    COPD (chronic obstructive pulmonary disease) (HCC)    Coronary artery disease    Nonobstructive   Essential hypertension    GERD (gastroesophageal reflux disease)    Hemorrhoids    History of TIA (transient ischemic attack)    Low back pain    Lumbar radiculopathy    Mixed hyperlipidemia    Palpitations    Peripheral vascular disease ([place])    Shingles 09/23/2019   Type 2 diabetes mellitus ([place])     Past Surgical History:  Procedure Laterality Date   APPENDECTOMY  1970   BIOPSY  12/08/2017   Procedure: BIOPSY;  Surgeon: [name], MD;  [facility];  Service: Endoscopy;;  esophagus    BIOPSY  12/28/2020   Procedure: BIOPSY;  Surgeon: [name], MD;  [facility];  Service: Endoscopy;;   BIOPSY  07/12/2021   Procedure: BIOPSY;  Surgeon: [name], MD;  [facility];  Service: Endoscopy;;   BREAST SURGERY Right    Cyst resection on the right   CARDIAC CATHETERIZATION  1990's   in [place]. no stent placement   CATARACT EXTRACTION W/PHACO Right 06/18/2016   Procedure: CATARACT EXTRACTION PHACO  AND INTRAOCULAR LENS PLACEMENT (IOC);  Surgeon: [name], MD;  Location: [facility];  Service: Ophthalmology;  Laterality: Right;  [name] 35.7 AP% 16.6 CDE 5.94 Fluid pack lot # [id number]   CATARACT EXTRACTION W/PHACO Left 07/16/2016   Procedure: CATARACT EXTRACTION PHACO AND INTRAOCULAR LENS PLACEMENT (IOC);  Surgeon: [name], MD;  Location: [facility];  Service: Ophthalmology;  Laterality: Left;  [name] 51.8 AP% 12.7 CDE 6.58 Fluid Pack Lot # [id number]   CHOLECYSTECTOMY  1993   COLONOSCOPY  02/12/2007   OEH:[id number] friable anal canal hemorrhoids, otherwise normal rectum and colon   COLONOSCOPY N/A 07/16/2012    RMR: Colonic diverticulosis. 4 mm tubular adenoma   COLONOSCOPY WITH PROPOFOL N/A 12/08/2017   Dr. Gala Romney: sigmoid and descending colon diverticulosis, transverse colon polyp (TUBULAR ADENOMA)   ENDARTERECTOMY Right 10/08/2019   Procedure: RIGHT CAROTID ENDARTERECTOMY;  Surgeon: Rosetta Posner, MD;  Location: [facility];  Service: Vascular;  Laterality: Right;   ESOPHAGOGASTRODUODENOSCOPY (EGD) WITH ESOPHAGEAL DILATION N/A 07/16/2012   RMR: +Candida esophagitis, Erosive reflux esophagiits. Schatizi's ring status post dilation. Hiatal hernia. Antral erosions status post biopsy.    ESOPHAGOGASTRODUODENOSCOPY (EGD) WITH PROPOFOL N/A 12/08/2017   Dr. Gala Romney: esophagitis, query EOE but negative for increased eosinophils on path, small hiatal hernia, normal stomach, normal duodenum, PAS stain with rare yeast forms on stain. Empiric dilation   ESOPHAGOGASTRODUODENOSCOPY (EGD) WITH PROPOFOL N/A 12/28/2020   esophagitis without bleeding, s/p biopsy and dilation. Antral erosions s/p biopsy.   ESOPHAGOGASTRODUODENOSCOPY (EGD) WITH PROPOFOL N/A 07/12/2021   Surgeon: [name], MD;  Cobblestoning appearance of distal esophagus, slight narrowing of distal esophagus s/p dilation with 53 Fr and biopsy, gastric erosions biopsied, normal examined duodenum.  Esophageal biopsy with reactive squamous mucosa, gastric biopsy with reactive gastropathy, negative for H. pylori.   EYE SURGERY Bilateral 2018   cataract   HERNIA REPAIR     JOINT REPLACEMENT     KNEE SURGERY     Left knee arthroscopy torn medial meniscus grade 4 chondral changes medial femoral condyle tibial plateau   MALONEY DILATION N/A 12/08/2017   Procedure: MALONEY DILATION;  Surgeon: [name], MD;  [facility];  Service: Endoscopy;  Laterality: N/A;   MALONEY DILATION N/A 12/28/2020   Procedure: Venia Minks DILATION;  Surgeon: [name], MD;  [facility];  Service: Endoscopy;  Laterality: N/A;   MALONEY DILATION N/A  07/12/2021   Procedure: Venia Minks DILATION;  Surgeon: [name], MD;  [facility];  Service: Endoscopy;  Laterality: N/A;   PATCH ANGIOPLASTY Right 10/08/2019   Procedure: PATCH ANGIOPLASTY of the right common carotid artery using hemashield plaltinum finesse patch;  Surgeon: Rosetta Posner, MD;  Location: [facility] OR;  Service: Vascular;  Laterality: Right;   PERIPHERAL VASCULAR INTERVENTION  07/17/2020   Procedure: PERIPHERAL VASCULAR INTERVENTION;  Surgeon: Waynetta Sandy, MD;  Location: [facility];  Service: Cardiovascular;;   POLYPECTOMY  12/08/2017   Procedure: POLYPECTOMY;  Surgeon: [name], MD;  [facility];  Service: Endoscopy;;  colon   TOTAL KNEE ARTHROPLASTY Left 04/27/2012   Procedure: TOTAL KNEE ARTHROPLASTY;  Surgeon: Carole Civil, MD;  Location: [facility];  Service: Orthopedics;  Laterality: Left;   TRANSCAROTID ARTERY REVASCULARIZATION  Left 10/12/2021   Procedure: Left Transcarotid Artery Revascularization;  Surgeon: Waynetta Sandy, MD;  Location: [facility];  Service: Vascular;  Laterality: Left;   [place] N/A 07/17/2020  Procedure: mesenteric ANGIOGRAPHY;  Surgeon: Waynetta Sandy, MD;  Location: [place] [facility];  Service: Cardiovascular;  Laterality: N/A;    Current Medications: Outpatient Medications Prior to Visit  Medication Sig Dispense Refill   albuterol (VENTOLIN HFA) 108 (90 Base) MCG/ACT inhaler Inhale 2 puffs into the lungs every 6 (six) hours as needed for wheezing or shortness of breath. 8 g 2   aspirin EC 81 MG tablet Take 1 tablet (81 mg total) by mouth daily at 6 (six) AM. Swallow whole. 30 tablet 12   buPROPion (WELLBUTRIN SR) 150 MG 12 hr tablet Take 150 mg by mouth 2 (two) times daily.     clopidogrel (PLAVIX) 75 MG tablet Take 1 tablet (75 mg total) by mouth daily. 30 tablet 11   diphenhydrAMINE (BENADRYL) 25 MG tablet Take 25 mg by mouth  daily as needed for allergies.      famotidine (PEPCID) 20 MG tablet Take 1 tablet (20 mg total) by mouth 2 (two) times daily. 60 tablet 3   insulin NPH Human (NOVOLIN N) 100 UNIT/ML injection Inject 25 Units into the skin in the morning and at bedtime.     lansoprazole (PREVACID) 30 MG capsule Take 1 capsule (30 mg total) by mouth 2 (two) times daily before a meal. 60 capsule 3   metFORMIN (GLUCOPHAGE) 1000 MG tablet Take 1 tablet (1,000 mg total) by mouth 2 (two) times daily with a meal. 60 tablet 3   metoprolol tartrate (LOPRESSOR) 25 MG tablet Take 0.5 tablets (12.5 mg total) by mouth 2 (two) times daily.     rosuvastatin (CRESTOR) 40 MG tablet Take 1 tablet (40 mg total) by mouth daily. 90 tablet 3   lisinopril-hydrochlorothiazide (ZESTORETIC) 20-12.5 MG tablet Take 1 tablet by mouth once daily 90 tablet 3   Aspirin-Caffeine (BC FAST PAIN RELIEF PO) Take 1 packet by mouth daily as needed (pain). (Patient not taking: Reported on 11/22/2021)     No facility-administered medications prior to visit.     Allergies:   Patient has no known allergies.   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     Social Determinants of Health   Financial Resource Strain: Not on file  Food Insecurity: Not on file  Transportation Needs: Not on file  Physical Activity: Not on file  Stress: Not on file  Social Connections: Not on file     Family History:  The patient's family history includes Cancer in his father and paternal grandmother; Cancer -  Lung in his father and maternal grandfather; Colon cancer in some other family members; Colon polyps in his mother; Coronary artery disease in his father; Dementia in his mother; Emphysema in his maternal grandmother; Heart attack in his father and paternal grandfather; Heart disease in his brother; Hypertension in his father and mother; Osteoporosis in his mother.   Review of Systems:    Please see the history of present illness.     All other systems reviewed and are otherwise negative except as noted above.   Physical Exam:    VS:  BP 100/60   Pulse 99   Ht '5\' 7"'$  (1.702 m)   Wt 138 lb 6.4 oz (62.8 kg)   SpO2 97%   BMI 21.68 kg/m    General: Pleasant, thin male appearing in no acute distress. Head: Normocephalic, atraumatic. Neck: No carotid bruits. JVD not elevated.  Lungs: Respirations regular and unlabored, without wheezes or rales.  Heart: Regular rate and rhythm. No S3 or S4.  No murmur, no rubs, or gallops appreciated. Abdomen: Appears non-distended. No obvious abdominal masses. Msk:  Strength and tone appear normal for age. No obvious joint deformities or effusions. Extremities: No clubbing or cyanosis. No pitting edema.  Distal pedal pulses are 2+ bilaterally. Neuro: Alert and oriented X 3. Moves all extremities spontaneously. No focal deficits noted. Psych:  Responds to questions appropriately with a normal affect. Skin: No rashes or lesions noted  Wt Readings from Last 3 Encounters:  11/22/21 138 lb 6.4 oz (62.8 kg)  11/14/21 133 lb 9.6 oz (60.6 kg)  10/27/21 140 lb (63.5 kg)     Studies/Labs Reviewed:   EKG:  EKG is not  ordered today. EKG from 10/27/2021 is reviewed and shows NSR, HR 82 with no acute ST changes.   Recent Labs: 10/04/2021: ALT 13 10/27/2021: BUN 20; Creatinine, Ser 1.63; Hemoglobin 11.1; Platelets 398; Potassium 3.6; Sodium 133   Lipid Panel    Component Value Date/Time   CHOL 82 10/13/2021 0513   CHOL 96 (L) 09/10/2021 0923   TRIG 318 (H) 10/13/2021 0513   HDL 30 (L) 10/13/2021 0513   HDL 34 (L) 09/10/2021 0923   CHOLHDL 2.7 10/13/2021 0513   VLDL 64 (H) 10/13/2021 0513   LDLCALC NOT CALCULATED 10/13/2021 0513   LDLCALC 23 09/10/2021 0923    Additional studies/ records that were reviewed today include:   Echocardiogram: 12/2018 IMPRESSIONS     1. Left ventricular ejection fraction, by visual estimation, is 60 to  65%. The left ventricle has normal function. There is moderately increased  left ventricular hypertrophy.   2. Left ventricular diastolic Doppler parameters are consistent with  impaired relaxation pattern of LV diastolic filling.   3. Global right ventricle has low normal systolic function.The right  ventricular size is normal. No increase in right ventricular wall  thickness.   4. Left atrial size was normal.   5. Right atrial size was normal.   6. Mild mitral annular calcification.   7. Mild to moderate aortic valve annular calcification.   8. The mitral valve is degenerative. No evidence of mitral valve  regurgitation.   9. The tricuspid valve is grossly normal. Tricuspid valve regurgitation  was not visualized by color flow Doppler.  10. The aortic valve is tricuspid Aortic valve regurgitation was not  visualized by color flow Doppler. Mild aortic valve sclerosis without  stenosis.  11. The pulmonic valve was not well visualized. Pulmonic valve  regurgitation is not visualized by color flow Doppler.  12. The inferior vena cava is normal in size with greater than 50%  respiratory variability, suggesting right atrial pressure of 3 mmHg.   Carotid Dopplers:  11/2021 Summary:  Right Carotid: Velocities in the right ICA are consistent with a 1-39%  stenosis.   Left Carotid: Patent left ICA stent with no evidence of restenosis.   Vertebrals:  Bilateral vertebral arteries demonstrate antegrade flow.  Subclavians: Normal flow hemodynamics were seen in bilateral subclavian               arteries.   Assessment:    1. Intermittent lightheadedness   2. Coronary artery disease involving native coronary artery of native heart without angina pectoris   3. Essential hypertension   4. Hyperlipidemia LDL goal <70   5. PAD (peripheral artery disease) ([place])   6. Unintentional weight loss      Plan:   In order of problems listed above:  1. Lightheadedness - I suspect this is secondary to intermittent hypotension and likely dehydration as his creatinine was elevated to 1.63 in 10/2021 (baseline 1.1-1.2). Orthostatics checked today and SBP dropped by 10 points. He is currently taking Lisinopril-HCTZ 20-12.5 mg daily and Lopressor 12.5 mg twice daily (has temporarily reduced to once daily). I recommended that we stop HCTZ and will provide an Rx for Lisinopril 20 mg daily alone. He will keep a BP log at home and report back with readings and symptoms within the next 1 to 2 weeks. If BP remains soft, will likely need to reduce Lisinopril to 10 mg daily but will make gradual changes given his significantly elevated BP in the past. Pending his BP trend, can switch Lopressor to Toprol-XL for sustained release.  2. CAD - He had nonobstructive CAD by review of notes and most recent ischemic evaluation was a low-risk NST in 2015. He has baseline dyspnea on exertion but denies any acute changes in this or associated chest pain.  Would have a low threshold to pursue repeat ischemic evaluation if he develops any progression of symptoms given noted LM and three-vessel CAD by Chest CT in 09/2021. He does need further work-up of his weight loss as discussed below. -  Continue current medical therapy with ASA 81 mg daily, Plavix 75 mg daily (on this per Vascular Surgery) and Crestor 40 mg daily.  3. HTN - His BP is soft at 100/60 during today's visit with similar results by recheck. Will stop HCTZ as outlined above and continue Lisinopril and Lopressor. May ultimately need to reduce Lisinopril dosing as well.  4. HLD - His LDL was at 23 in 09/2021. Remains on Crestor 40 mg daily.  5. PAD - He underwent stenting to the superior mesenteric artery in 07/2020, R CEA in 2021 and recent left TCAR in 10/2021. Followed by Vascular Surgery.   6. Unintentional Weight Loss - He reports an unintentional weight loss of over 40 pounds within the past year and has noted intermittent tarry stools (Hgb at 11.1 last month). He is following up with his PCP later this month. Will likely need a stool card and to check PSA as he reports this has never been checked. He will likely require referral back to GI for consideration of performing his colonoscopy prior to his next due date.    Medication Adjustments/Labs and Tests Ordered: Current medicines are reviewed at length with the patient today.  Concerns regarding medicines are outlined above.  Medication changes, Labs and Tests ordered today are listed in the Patient Instructions below.  Patient Instructions  Medication Instructions:  STOP Lisinopril/HCTZ   START Lisinopril 20 mg daily  Labwork: None today  Testing/Procedures: None today  Follow-Up: 3 months  Any Other Special Instructions Will Be Listed Below (If Applicable).   Keep a daily BP log and call us in 2 weeks with readings.  If you need a refill on your cardiac medications before your next appointment, please call your pharmacy.    Signed, Erma Heritage, PA-C  11/22/2021 4:52 PM    [facility] [address] [place], [place] [address] Phone: [phone] Fax: [phone]

## 2021-11-22 NOTE — Patient Instructions (Signed)
Medication Instructions:  STOP Lisinopril/HCTZ   START Lisinopril 20 mg daily  Labwork: None today  Testing/Procedures: None today  Follow-Up: 3 months  Any Other Special Instructions Will Be Listed Below (If Applicable).   Keep a daily BP log and call us in 2 weeks with readings.  If you need a refill on your cardiac medications before your next appointment, please call your pharmacy.

## 2021-11-30 ENCOUNTER — Ambulatory Visit (HOSPITAL_BASED_OUTPATIENT_CLINIC_OR_DEPARTMENT_OTHER): Payer: Medicare HMO | Admitting: Family

## 2021-12-04 ENCOUNTER — Ambulatory Visit: Payer: Medicare HMO | Admitting: Student

## 2021-12-10 ENCOUNTER — Ambulatory Visit: Payer: Medicare HMO | Admitting: Nurse Practitioner

## 2021-12-11 ENCOUNTER — Ambulatory Visit (INDEPENDENT_AMBULATORY_CARE_PROVIDER_SITE_OTHER): Payer: Medicare HMO | Admitting: Internal Medicine

## 2021-12-11 ENCOUNTER — Ambulatory Visit: Payer: Medicare HMO | Admitting: Nurse Practitioner

## 2021-12-11 ENCOUNTER — Encounter: Payer: Self-pay | Admitting: Internal Medicine

## 2021-12-11 VITALS — BP 117/71 | HR 53 | Ht 67.0 in | Wt 138.2 lb

## 2021-12-11 DIAGNOSIS — R634 Abnormal weight loss: Secondary | ICD-10-CM | POA: Diagnosis not present

## 2021-12-11 DIAGNOSIS — E1159 Type 2 diabetes mellitus with other circulatory complications: Secondary | ICD-10-CM

## 2021-12-11 DIAGNOSIS — I10 Essential (primary) hypertension: Secondary | ICD-10-CM | POA: Diagnosis not present

## 2021-12-11 DIAGNOSIS — Z23 Encounter for immunization: Secondary | ICD-10-CM

## 2021-12-11 MED ORDER — INSULIN ASPART 100 UNIT/ML IJ SOLN: 5.0000 [IU] | Freq: Three times a day (TID) | INTRAMUSCULAR | 11 refills | Status: DC

## 2021-12-11 MED ORDER — EMPAGLIFLOZIN 10 MG PO TABS: 10.0000 mg | ORAL_TABLET | Freq: Every day | ORAL | 2 refills | Status: DC

## 2021-12-11 MED ORDER — GVOKE HYPOPEN 2-PACK 1 MG/0.2ML ~~LOC~~ SOAJ: 1.0000 mg | SUBCUTANEOUS | 1 refills | Status: DC | PRN

## 2021-12-11 MED ORDER — LANTUS 100 UNIT/ML ~~LOC~~ SOLN: 10.0000 [IU] | Freq: Every day | SUBCUTANEOUS | 99 refills | Status: DC

## 2021-12-11 MED ORDER — NOVOLOG 100 UNIT/ML IJ SOLN: 5.0000 [IU] | Freq: Three times a day (TID) | INTRAMUSCULAR | 11 refills | Status: DC

## 2021-12-11 NOTE — Patient Instructions (Signed)
It was a pleasure to see you today.  Thank you for giving [name] the opportunity to be involved in your care.  Below is a brief recap of your visit and next steps.  We will plan to see you again in 2 weeks.  Summary Diabetes regimen: Lantus 10 units daily Novlog 5 units 3 x daily with meals Jardiance 10 mg daily Continue metformin  Next steps Follow up in 2 weeks Please keep a blood sugar log in the interim

## 2021-12-11 NOTE — Progress Notes (Signed)
Established Patient Office Visit  Subjective   Patient ID: Jimmy Duran, male    DOB: 01-14-1956  Age: 66 y.o. MRN: [id number]  Chief Complaint  Patient presents with   Follow-up   Jimmy Duran is a 66 year old male returning to care today.  He has a past medical history significant for HTN, CAD, carotid artery disease, PAD, T2DM, COPD, HLD.  He was last seen at [facility] on 08/09/2021 by Vena Rua, NP to establish care.  In the interim, he has undergone left TCAR with vascular surgery in August (8/11).  He also presented to the emergency department for dizziness on 8/26 and was discharged home.  He was seen by cardiology for follow-up last month and continued to endorse dizziness.  HCTZ was discontinued and he was prescribed lisinopril 20 mg daily alone.  Today Mr. Jimmy Duran states that he feels fairly well.  He continues to endorse chronic fatigue.  He has lost 40 pounds in the last year unintentionally.  He has not noted any blood in his stool.  He denies night sweats and has not appreciated any lymphadenopathy.  He would like to review his diabetes regimen today.  He is accompanied by his wife.  Past Medical History:  Diagnosis Date   AAA (abdominal aortic aneurysm) (HCC)    Arthritis    Carotid artery disease (HCC)    Colon polyp    COPD (chronic obstructive pulmonary disease) (HCC)    Coronary artery disease    Nonobstructive   Essential hypertension    GERD (gastroesophageal reflux disease)    Hemorrhoids    History of TIA (transient ischemic attack)    Low back pain    Lumbar radiculopathy    Mixed hyperlipidemia    Palpitations    Peripheral vascular disease ([place])    Shingles 09/23/2019   Type 2 diabetes mellitus ([place])    Past Surgical History:  Procedure Laterality Date   APPENDECTOMY  1970   BIOPSY  12/08/2017   Procedure: BIOPSY;  Surgeon: [name], MD;  [facility];  Service: Endoscopy;;  esophagus    BIOPSY  12/28/2020   Procedure: BIOPSY;   Surgeon: [name], MD;  [facility];  Service: Endoscopy;;   BIOPSY  07/12/2021   Procedure: BIOPSY;  Surgeon: [name], MD;  [facility];  Service: Endoscopy;;   BREAST SURGERY Right    Cyst resection on the right   CARDIAC CATHETERIZATION  1990's   in [place]. no stent placement   CATARACT EXTRACTION W/PHACO Right 06/18/2016   Procedure: CATARACT EXTRACTION PHACO AND INTRAOCULAR LENS PLACEMENT ([place]);  Surgeon: [name], MD;  Location: [facility];  Service: Ophthalmology;  Laterality: Right;  [name] 35.7 AP% 16.6 CDE 5.94 Fluid pack lot # [id number]   CATARACT EXTRACTION W/PHACO Left 07/16/2016   Procedure: CATARACT EXTRACTION PHACO AND INTRAOCULAR LENS PLACEMENT (IOC);  Surgeon: [name], MD;  Location: [facility];  Service: Ophthalmology;  Laterality: Left;  [name] 51.8 AP% 12.7 CDE 6.58 Fluid Pack Lot # [id number]   CHOLECYSTECTOMY  1993   COLONOSCOPY  02/12/2007   TKZ:[id number] friable anal canal hemorrhoids, otherwise normal rectum and colon   COLONOSCOPY N/A 07/16/2012   RMR: Colonic diverticulosis. 4 mm tubular adenoma   COLONOSCOPY WITH PROPOFOL N/A 12/08/2017   Dr. Gala Romney: sigmoid and descending colon diverticulosis, transverse colon polyp (TUBULAR ADENOMA)   ENDARTERECTOMY Right 10/08/2019   Procedure: RIGHT CAROTID ENDARTERECTOMY;  Surgeon: Rosetta Posner, MD;  [facility];  Service: Vascular;  Laterality: Right;   ESOPHAGOGASTRODUODENOSCOPY (EGD) WITH ESOPHAGEAL DILATION N/A 07/16/2012   RMR: +Candida esophagitis, Erosive reflux esophagiits. Schatizi's ring status post dilation. Hiatal hernia. Antral erosions status post biopsy.    ESOPHAGOGASTRODUODENOSCOPY (EGD) WITH PROPOFOL N/A 12/08/2017   Dr. Gala Romney: esophagitis, query EOE but negative for increased eosinophils on path, small hiatal hernia, normal stomach, normal duodenum, PAS stain with rare yeast forms on stain. Empiric dilation   ESOPHAGOGASTRODUODENOSCOPY (EGD) WITH  PROPOFOL N/A 12/28/2020   esophagitis without bleeding, s/p biopsy and dilation. Antral erosions s/p biopsy.   ESOPHAGOGASTRODUODENOSCOPY (EGD) WITH PROPOFOL N/A 07/12/2021   Surgeon: [name], MD;  Cobblestoning appearance of distal esophagus, slight narrowing of distal esophagus s/p dilation with 70 Fr and biopsy, gastric erosions biopsied, normal examined duodenum.  Esophageal biopsy with reactive squamous mucosa, gastric biopsy with reactive gastropathy, negative for H. pylori.   EYE SURGERY Bilateral 2018   cataract   HERNIA REPAIR     JOINT REPLACEMENT     KNEE SURGERY     Left knee arthroscopy torn medial meniscus grade 4 chondral changes medial femoral condyle tibial plateau   MALONEY DILATION N/A 12/08/2017   Procedure: MALONEY DILATION;  Surgeon: [name], MD;  [facility];  Service: Endoscopy;  Laterality: N/A;   MALONEY DILATION N/A 12/28/2020   Procedure: Venia Minks DILATION;  Surgeon: [name], MD;  [facility];  Service: Endoscopy;  Laterality: N/A;   MALONEY DILATION N/A 07/12/2021   Procedure: Venia Minks DILATION;  Surgeon: [name], MD;  [facility];  Service: Endoscopy;  Laterality: N/A;   PATCH ANGIOPLASTY Right 10/08/2019   Procedure: PATCH ANGIOPLASTY of the right common carotid artery using hemashield plaltinum finesse patch;  Surgeon: Rosetta Posner, MD;  Location: [facility];  Service: Vascular;  Laterality: Right;   PERIPHERAL VASCULAR INTERVENTION  07/17/2020   Procedure: PERIPHERAL VASCULAR INTERVENTION;  Surgeon: Waynetta Sandy, MD;  Location: [facility];  Service: Cardiovascular;;   POLYPECTOMY  12/08/2017   Procedure: POLYPECTOMY;  Surgeon: [name], MD;  [facility];  Service: Endoscopy;;  colon   TOTAL KNEE ARTHROPLASTY Left 04/27/2012   Procedure: TOTAL KNEE ARTHROPLASTY;  Surgeon: Carole Civil, MD;  [facility]: [facility];  Service: Orthopedics;  Laterality: Left;    TRANSCAROTID ARTERY REVASCULARIZATION  Left 10/12/2021   Procedure: Left Transcarotid Artery Revascularization;  Surgeon: Waynetta Sandy, MD;  Location: [facility];  Service: Vascular;  Laterality: Left;   [place] N/A 07/17/2020   Procedure: mesenteric ANGIOGRAPHY;  Surgeon: Waynetta Sandy, MD;  Location: [facility];  Service: Cardiovascular;  Laterality: N/A;   Social History   Tobacco Use   Smoking status: Every Day    Packs/day: 0.50    Years: 53.00    Total pack years: 26.50    Types: Cigarettes   Smokeless tobacco: Former    Types: Chew   Tobacco comments:    0.5 pk per day currently   Vaping Use   Vaping Use: Former  Substance Use Topics   Alcohol use: Not Currently    Alcohol/week: 0.0 standard drinks of alcohol    Comment: socially "beer and tomato juice"    Drug use: No   Family History  Problem Relation Age of Onset   Dementia Mother    Hypertension Mother    Colon polyps Mother    Osteoporosis  Mother    Cancer Father    Hypertension Father    Coronary artery disease Father        CABG in his 30's   Heart attack Father    Cancer - Lung Father    Heart disease Brother        has a pacemaker   Emphysema Maternal Grandmother    Cancer - Lung Maternal Grandfather    Cancer Paternal Grandmother    Heart attack Paternal Grandfather    Colon cancer Other        maternal great aunt   Colon cancer Other        paternal great aunt   No Known Allergies  Review of Systems  Constitutional:  Positive for malaise/fatigue.  All other systems reviewed and are negative.    Objective:     BP 117/71   Pulse (!) 53   Ht '5\' 7"'$  (1.702 m)   Wt 138 lb 3.2 oz (62.7 kg)   SpO2 96%   BMI 21.65 kg/m  BP Readings from Last 3 Encounters:  12/11/21 117/71  11/22/21 100/60  11/14/21 132/82   Physical Exam Constitutional:      Comments: Appears older than stated age  HENT:     Head: Normocephalic  and atraumatic.     Right Ear: External ear normal.     Left Ear: External ear normal.     Nose: Nose normal.     Mouth/Throat:     Mouth: Mucous membranes are moist.     Pharynx: Oropharynx is clear. No oropharyngeal exudate or posterior oropharyngeal erythema.  Eyes:     General: No scleral icterus.    Extraocular Movements: Extraocular movements intact.     Conjunctiva/sclera: Conjunctivae normal.     Pupils: Pupils are equal, round, and reactive to light.  Cardiovascular:     Rate and Rhythm: Normal rate and regular rhythm.     Pulses: Normal pulses.     Heart sounds: Normal heart sounds.  Pulmonary:     Effort: Pulmonary effort is normal.     Breath sounds: Normal breath sounds. No wheezing, rhonchi or rales.  Abdominal:     General: Abdomen is flat. Bowel sounds are normal. There is no distension.     Palpations: Abdomen is soft.     Tenderness: There is no abdominal tenderness.  Musculoskeletal:     Cervical back: Normal range of motion.     Right lower leg: No edema.     Left lower leg: No edema.  Lymphadenopathy:     Cervical: No cervical adenopathy.  Skin:    General: Skin is warm and dry.     Capillary Refill: Capillary refill takes less than 2 seconds.     Coloration: Skin is not jaundiced.  Neurological:     General: No focal deficit present.     Mental Status: He is alert and oriented to person, place, and time.  Psychiatric:        Mood and Affect: Mood normal.        Behavior: Behavior normal.    Last CBC Lab Results  Component Value Date   WBC 6.8 12/11/2021   HGB 10.9 (L) 12/11/2021   HCT 34.3 (L) 12/11/2021   MCV 86 12/11/2021   MCH 27.3 12/11/2021   RDW 13.8 12/11/2021   PLT 372 81/82/9937   Last metabolic panel Lab Results  Component Value Date   GLUCOSE 272 (H) 10/27/2021   NA 133 (L) 10/27/2021  K 3.6 10/27/2021   CL 100 10/27/2021   CO2 23 10/27/2021   BUN 20 10/27/2021   CREATININE 1.63 (H) 10/27/2021   GFRNONAA 47 (L) 10/27/2021    CALCIUM 8.9 10/27/2021   PROT 7.0 10/04/2021   ALBUMIN 4.1 10/04/2021   LABGLOB 1.9 09/10/2021   AGRATIO 2.4 (H) 09/10/2021   BILITOT 0.4 10/04/2021   ALKPHOS 74 10/04/2021   AST 16 10/04/2021   ALT 13 10/04/2021   ANIONGAP 10 10/27/2021   Last lipids Lab Results  Component Value Date   CHOL 82 10/13/2021   HDL 30 (L) 10/13/2021   LDLCALC NOT CALCULATED 10/13/2021   TRIG 318 (H) 10/13/2021   CHOLHDL 2.7 10/13/2021   Last hemoglobin A1c Lab Results  Component Value Date   HGBA1C 9.6 (H) 12/11/2021   Last thyroid functions Lab Results  Component Value Date   TSH 1.790 10/18/2013     Assessment & Plan:   Problem List Items Addressed This Visit       Essential hypertension, benign    BP within goal, 117/71.  He is taking lisinopril 20 mg daily as prescribed.  No changes today.      DM type 2 causing vascular disease ([place]) - Primary    Current regimen: Metformin 1000 mg twice daily, Novolin 5 units 3 times daily with meals Last HgbA1c 8.4 He has a CGM and checks his blood sugar regularly at home.  Readings are consistently 300-500s.  -I reviewed with Mr. Lipsky that Novolin is not typically taken 3 times daily.  He states that he may have been mistaken and needing to take NovoLog instead. -Remains poorly controlled.  Start Lantus 10 units daily today.  NovoLog 5 units 3 times daily with meals prescribed.  We will also add Jardiance 10 mg daily due to his cardiovascular history. -Repeat A1c today -Diabetic foot exam to be performed at follow-up -Follow up in 2 weeks      Need for influenza vaccination    Influenza vaccine administered today      Unintentional weight loss    He reports roughly 40 pounds of unintentional weight loss over the last year.  He endorses fatigue today.  He has not noted any melena and denies night sweats, fever/chills, and lymphadenopathy. -Iron studies ordered today -We will also check PSA -Follow-up in 2 weeks      Return in  about 2 weeks (around 12/25/2021).    [name], MD

## 2021-12-12 LAB — IRON,TIBC AND FERRITIN PANEL
Ferritin: 20 ng/mL — ABNORMAL LOW (ref 30–400)
Iron Saturation: 11 % — ABNORMAL LOW (ref 15–55)
Iron: 44 ug/dL (ref 38–169)
Total Iron Binding Capacity: 412 ug/dL (ref 250–450)
UIBC: 368 ug/dL — ABNORMAL HIGH (ref 111–343)

## 2021-12-12 LAB — CBC WITH DIFFERENTIAL/PLATELET
Basophils Absolute: 0.1 10*3/uL (ref 0.0–0.2)
Basos: 2 %
EOS (ABSOLUTE): 0.2 10*3/uL (ref 0.0–0.4)
Eos: 3 %
Hematocrit: 34.3 % — ABNORMAL LOW (ref 37.5–51.0)
Hemoglobin: 10.9 g/dL — ABNORMAL LOW (ref 13.0–17.7)
Immature Grans (Abs): 0 10*3/uL (ref 0.0–0.1)
Immature Granulocytes: 0 %
Lymphocytes Absolute: 1.5 10*3/uL (ref 0.7–3.1)
Lymphs: 23 %
MCH: 27.3 pg (ref 26.6–33.0)
MCHC: 31.8 g/dL (ref 31.5–35.7)
MCV: 86 fL (ref 79–97)
Monocytes Absolute: 0.7 10*3/uL (ref 0.1–0.9)
Monocytes: 10 %
Neutrophils Absolute: 4.2 10*3/uL (ref 1.4–7.0)
Neutrophils: 62 %
Platelets: 372 10*3/uL (ref 150–450)
RBC: 3.99 x10E6/uL — ABNORMAL LOW (ref 4.14–5.80)
RDW: 13.8 % (ref 11.6–15.4)
WBC: 6.8 10*3/uL (ref 3.4–10.8)

## 2021-12-12 LAB — HEMOGLOBIN A1C
Est. average glucose Bld gHb Est-mCnc: 229 mg/dL
Hgb A1c MFr Bld: 9.6 % — ABNORMAL HIGH (ref 4.8–5.6)

## 2021-12-12 LAB — PSA: Prostate Specific Ag, Serum: 0.4 ng/mL (ref 0.0–4.0)

## 2021-12-13 ENCOUNTER — Other Ambulatory Visit: Payer: Self-pay | Admitting: Internal Medicine

## 2021-12-13 DIAGNOSIS — E611 Iron deficiency: Secondary | ICD-10-CM

## 2021-12-13 MED ORDER — IRON (FERROUS SULFATE) 325 (65 FE) MG PO TABS: 325.0000 mg | ORAL_TABLET | ORAL | 2 refills | Status: DC

## 2021-12-14 ENCOUNTER — Encounter: Payer: Self-pay | Admitting: Internal Medicine

## 2021-12-14 DIAGNOSIS — R634 Abnormal weight loss: Secondary | ICD-10-CM | POA: Insufficient documentation

## 2021-12-14 DIAGNOSIS — Z23 Encounter for immunization: Secondary | ICD-10-CM | POA: Insufficient documentation

## 2021-12-14 NOTE — Assessment & Plan Note (Signed)
Influenza vaccine administered today.

## 2021-12-14 NOTE — Assessment & Plan Note (Signed)
BP within goal, 117/71.  He is taking lisinopril 20 mg daily as prescribed.  No changes today.

## 2021-12-14 NOTE — Assessment & Plan Note (Signed)
He reports roughly 40 pounds of unintentional weight loss over the last year.  He endorses fatigue today.  He has not noted any melena and denies night sweats, fever/chills, and lymphadenopathy. -Iron studies ordered today -We will also check PSA -Follow-up in 2 weeks

## 2021-12-14 NOTE — Assessment & Plan Note (Addendum)
Current regimen: Metformin 1000 mg twice daily, Novolin 5 units 3 times daily with meals Last HgbA1c 8.4 He has a CGM and checks his blood sugar regularly at home.  Readings are consistently 300-500s.  -I reviewed with Jimmy Duran that Novolin is not typically taken 3 times daily.  He states that he may have been mistaken and needing to take NovoLog instead. -Remains poorly controlled.  Start Lantus 10 units daily today.  NovoLog 5 units 3 times daily with meals prescribed.  We will also add Jardiance 10 mg daily due to his cardiovascular history. -Repeat A1c today -Diabetic foot exam to be performed at follow-up -Follow up in 2 weeks

## 2021-12-17 ENCOUNTER — Telehealth: Payer: Self-pay

## 2021-12-17 DIAGNOSIS — E1159 Type 2 diabetes mellitus with other circulatory complications: Secondary | ICD-10-CM

## 2021-12-17 NOTE — Telephone Encounter (Signed)
Pt wife called back the correct number is [phone]   Jimmy Duran is based out of [place]

## 2021-12-17 NOTE — Telephone Encounter (Signed)
Called need refill   [facility] . # [phone]

## 2021-12-18 ENCOUNTER — Other Ambulatory Visit: Payer: Self-pay

## 2021-12-18 NOTE — Telephone Encounter (Signed)
Can you clarify if he needs the device or sensors? Also please clarify where to send Rx. Thanks.

## 2021-12-19 ENCOUNTER — Other Ambulatory Visit: Payer: Self-pay

## 2021-12-19 DIAGNOSIS — E1159 Type 2 diabetes mellitus with other circulatory complications: Secondary | ICD-10-CM

## 2021-12-19 MED ORDER — FREESTYLE LIBRE 3 SENSOR MISC: 1.0000 | 2 refills | Status: DC

## 2021-12-19 NOTE — Telephone Encounter (Signed)
New Rx for sensors sent to [facility], [address].

## 2021-12-19 NOTE — Addendum Note (Signed)
Addended by: Marland Kitchen E on: 12/19/2021 08:25 AM   Modules accepted: Orders

## 2021-12-25 ENCOUNTER — Encounter: Payer: Self-pay | Admitting: Internal Medicine

## 2021-12-25 ENCOUNTER — Ambulatory Visit (INDEPENDENT_AMBULATORY_CARE_PROVIDER_SITE_OTHER): Payer: Medicare HMO | Admitting: Internal Medicine

## 2021-12-25 VITALS — BP 130/80 | HR 91 | Ht 67.0 in | Wt 138.0 lb

## 2021-12-25 DIAGNOSIS — D509 Iron deficiency anemia, unspecified: Secondary | ICD-10-CM | POA: Insufficient documentation

## 2021-12-25 DIAGNOSIS — E1159 Type 2 diabetes mellitus with other circulatory complications: Secondary | ICD-10-CM | POA: Diagnosis not present

## 2021-12-25 DIAGNOSIS — I10 Essential (primary) hypertension: Secondary | ICD-10-CM | POA: Diagnosis not present

## 2021-12-25 MED ORDER — NOVOLOG 100 UNIT/ML IJ SOLN: 10.0000 [IU] | Freq: Three times a day (TID) | INTRAMUSCULAR | 11 refills | Status: DC

## 2021-12-25 MED ORDER — INSULIN GLARGINE 100 UNIT/ML ~~LOC~~ SOLN: 18.0000 [IU] | Freq: Every day | SUBCUTANEOUS | 99 refills | Status: DC

## 2021-12-25 NOTE — Assessment & Plan Note (Signed)
A1c has increased to 9.6.  His latest regimen includes Lantus 10 units nightly, NovoLog 5 units 3 times daily with meals, metformin 1000 mg twice daily, and Jardiance 10 mg daily.  He states that he has not been taking metformin because he thought he was supposed to stop the medication.  He plans to resume taking it this evening.  He reports morning readings in the mid 200s, lunchtime readings in the 300s, and evening readings around 400.  He denies symptoms of polyuria/polydipsia currently. -Increase Lantus to 18 units nightly -Increase NovoLog to 10 units 3 times daily with meals -Continue Jardiance at current dose -Resume metformin -Diabetic foot exam completed today -He states that he has been seen for a diabetic eye exam within the last year and will bring documentation to his next appointment -Follow-up in 2 weeks for telephone encounter to review recent blood sugar readings

## 2021-12-25 NOTE — Patient Instructions (Signed)
It was a pleasure to see you today.  Thank you for giving [name] the opportunity to be involved in your care.  Below is a brief recap of your visit and next steps.  We will plan to see you again in 2 weeks (telephone)  Summary We have adjusted your insulin regimen today: -Lantus 18 units nightly -Novolog 10 units 3 times daily with meals -Continue Jardiance and Metformin at current doses  Next steps Follow up in 2 weeks for telephone visit to review blood sugars

## 2021-12-25 NOTE — Progress Notes (Signed)
Established Patient Office Visit  Subjective   Patient ID: Jimmy Duran, male    DOB: October 29, 1955  Age: 66 y.o. MRN: [id number]  Chief Complaint  Patient presents with   Follow-up   Mr. Pascarella returns to care today.  He is a 66 year old male with a past medical history significant for HTN, CAD, carotid artery disease, PAD, T2DM, COPD, and HLD.  He was last seen by me on 12/11/2021.  At that time Lantus 10 units daily, NovoLog 5 units 3 times daily with meals, and Jardiance 10 mg daily were prescribed.  Labs were also ordered due to symptoms of unintentional weight loss and fatigue and subsequently showed iron deficiency.  2-week follow-up was arranged.  There have been no acute interval events.  Mr. Saenz states that he has seen some improvement in his blood sugar readings since changes to his insulin regimen 2 weeks ago.  He says that his blood sugar remains significantly elevated, but is better than previous.  He reports morning readings consistently in the mid 200s, lunchtime readings in the 300s, and dinner readings around 400.  He has stopped taking metformin due to a misunderstanding, but says he will resume metformin this evening.  He continues to endorse fatigue.  I recommended that he start iron supplementation based on labs from his last appointment, however he endorses significant GI side effects.  Past Medical History:  Diagnosis Date   AAA (abdominal aortic aneurysm) (HCC)    Arthritis    Carotid artery disease (HCC)    Colon polyp    COPD (chronic obstructive pulmonary disease) (HCC)    Coronary artery disease    Nonobstructive   Essential hypertension    GERD (gastroesophageal reflux disease)    Hemorrhoids    History of TIA (transient ischemic attack)    Low back pain    Lumbar radiculopathy    Mixed hyperlipidemia    Palpitations    Peripheral vascular disease ([place])    Shingles 09/23/2019   Type 2 diabetes mellitus ([place])    Past Surgical History:  Procedure  Laterality Date   APPENDECTOMY  1970   BIOPSY  12/08/2017   Procedure: BIOPSY;  Surgeon: [name], MD;  [facility];  Service: Endoscopy;;  esophagus    BIOPSY  12/28/2020   Procedure: BIOPSY;  Surgeon: [name], MD;  [facility];  Service: Endoscopy;;   BIOPSY  07/12/2021   Procedure: BIOPSY;  Surgeon: [name], MD;  [facility];  Service: Endoscopy;;   BREAST SURGERY Right    Cyst resection on the right   CARDIAC CATHETERIZATION  1990's   in [place]. no stent placement   CATARACT EXTRACTION W/PHACO Right 06/18/2016   Procedure: CATARACT EXTRACTION PHACO AND INTRAOCULAR LENS PLACEMENT ([place]);  Surgeon: [name], MD;  Location: [facility];  Service: Ophthalmology;  Laterality: Right;  [name] 35.7 AP% 16.6 CDE 5.94 Fluid pack lot # [id number]   CATARACT EXTRACTION W/PHACO Left 07/16/2016   Procedure: CATARACT EXTRACTION PHACO AND INTRAOCULAR LENS PLACEMENT (I[facility]);  Surgeon: [name], MD;  Location: [facility];  Service: Ophthalmology;  Laterality: Left;  [name] 51.8 AP% 12.7 CDE 6.58 Fluid Pack Lot # [id number]   CHOLECYSTECTOMY  1993   COLONOSCOPY  02/12/2007   GUR:[id number] friable anal canal hemorrhoids, otherwise normal rectum and colon   COLONOSCOPY N/A 07/16/2012   RMR: Colonic diverticulosis. 4 mm tubular adenoma   COLONOSCOPY WITH PROPOFOL N/A 12/08/2017   Dr. Gala Romney:  sigmoid and descending colon diverticulosis, transverse colon polyp (TUBULAR ADENOMA)   ENDARTERECTOMY Right 10/08/2019   Procedure: RIGHT CAROTID ENDARTERECTOMY;  Surgeon: Rosetta Posner, MD;  [facility];  Service: Vascular;  Laterality: Right;   ESOPHAGOGASTRODUODENOSCOPY (EGD) WITH ESOPHAGEAL DILATION N/A 07/16/2012   RMR: +Candida esophagitis, Erosive reflux esophagiits. Schatizi's ring status post dilation. Hiatal hernia. Antral erosions status post biopsy.    ESOPHAGOGASTRODUODENOSCOPY (EGD) WITH PROPOFOL N/A 12/08/2017   Dr. Gala Romney:  esophagitis, query EOE but negative for increased eosinophils on path, small hiatal hernia, normal stomach, normal duodenum, PAS stain with rare yeast forms on stain. Empiric dilation   ESOPHAGOGASTRODUODENOSCOPY (EGD) WITH PROPOFOL N/A 12/28/2020   esophagitis without bleeding, s/p biopsy and dilation. Antral erosions s/p biopsy.   ESOPHAGOGASTRODUODENOSCOPY (EGD) WITH PROPOFOL N/A 07/12/2021   Surgeon: [name], MD;  Cobblestoning appearance of distal esophagus, slight narrowing of distal esophagus s/p dilation with 61 Fr and biopsy, gastric erosions biopsied, normal examined duodenum.  Esophageal biopsy with reactive squamous mucosa, gastric biopsy with reactive gastropathy, negative for H. pylori.   EYE SURGERY Bilateral 2018   cataract   HERNIA REPAIR     JOINT REPLACEMENT     KNEE SURGERY     Left knee arthroscopy torn medial meniscus grade 4 chondral changes medial femoral condyle tibial plateau   MALONEY DILATION N/A 12/08/2017   Procedure: MALONEY DILATION;  Surgeon: [name], MD;  [facility];  Service: Endoscopy;  Laterality: N/A;   MALONEY DILATION N/A 12/28/2020   Procedure: Venia Minks DILATION;  Surgeon: [name], MD;  [facility];  Service: Endoscopy;  Laterality: N/A;   MALONEY DILATION N/A 07/12/2021   Procedure: Venia Minks DILATION;  Surgeon: [name], MD;  [facility];  Service: Endoscopy;  Laterality: N/A;   PATCH ANGIOPLASTY Right 10/08/2019   Procedure: PATCH ANGIOPLASTY of the right common carotid artery using hemashield plaltinum finesse patch;  Surgeon: Rosetta Posner, MD;  Location: [facility];  Service: Vascular;  Laterality: Right;   PERIPHERAL VASCULAR INTERVENTION  07/17/2020   Procedure: PERIPHERAL VASCULAR INTERVENTION;  Surgeon: Waynetta Sandy, MD;  Location: [facility];  Service: Cardiovascular;;   POLYPECTOMY  12/08/2017   Procedure: POLYPECTOMY;  Surgeon: [name], MD;  [facility];  Service: Endoscopy;;  colon   TOTAL KNEE ARTHROPLASTY Left 04/27/2012   Procedure: TOTAL KNEE ARTHROPLASTY;  Surgeon: Carole Civil, MD;  Location: [facility];  Service: Orthopedics;  Laterality: Left;   TRANSCAROTID ARTERY REVASCULARIZATION  Left 10/12/2021   Procedure: Left Transcarotid Artery Revascularization;  Surgeon: Waynetta Sandy, MD;  Location: [facility];  Service: Vascular;  Laterality: Left;   [place] N/A 07/17/2020   Procedure: mesenteric ANGIOGRAPHY;  Surgeon: Waynetta Sandy, MD;  Location: [facility];  Service: Cardiovascular;  Laterality: N/A;   Social History   Tobacco Use   Smoking status: Every Day    Packs/day: 0.50    Years: 53.00    Total pack years: 26.50    Types: Cigarettes   Smokeless tobacco: Former    Types: Chew   Tobacco comments:    0.5 pk per day currently   Vaping Use   Vaping Use: Former  Substance Use Topics   Alcohol use: Not Currently    Alcohol/week: 0.0 standard drinks of alcohol    Comment: socially "beer and tomato juice"    Drug use: No  Family History  Problem Relation Age of Onset   Dementia Mother    Hypertension Mother    Colon polyps Mother    Osteoporosis Mother    Cancer Father    Hypertension Father    Coronary artery disease Father        CABG in his 54's   Heart attack Father    Cancer - Lung Father    Heart disease Brother        has a pacemaker   Emphysema Maternal Grandmother    Cancer - Lung Maternal Grandfather    Cancer Paternal Grandmother    Heart attack Paternal Grandfather    Colon cancer Other        maternal great aunt   Colon cancer Other        paternal great aunt   No Known Allergies  Review of Systems  Constitutional:  Positive for malaise/fatigue.  All other systems reviewed and are negative.    Objective:     BP 130/80   Pulse 91   Ht '5\' 7"'$  (1.702 m)   Wt 138 lb (62.6 kg)   SpO2 93%   BMI 21.61  kg/m  BP Readings from Last 3 Encounters:  12/25/21 130/80  12/11/21 117/71  11/22/21 100/60   Physical Exam Constitutional:      Comments: Appears older than stated age  HENT:     Head: Normocephalic and atraumatic.     Right Ear: External ear normal.     Left Ear: External ear normal.     Nose: Nose normal.     Mouth/Throat:     Mouth: Mucous membranes are moist.     Pharynx: Oropharynx is clear. No oropharyngeal exudate or posterior oropharyngeal erythema.  Eyes:     General: No scleral icterus.    Extraocular Movements: Extraocular movements intact.     Conjunctiva/sclera: Conjunctivae normal.     Pupils: Pupils are equal, round, and reactive to light.  Cardiovascular:     Rate and Rhythm: Normal rate and regular rhythm.     Pulses: Normal pulses.     Heart sounds: Normal heart sounds.  Pulmonary:     Effort: Pulmonary effort is normal.     Breath sounds: Normal breath sounds. No wheezing, rhonchi or rales.  Abdominal:     General: Abdomen is flat. Bowel sounds are normal. There is no distension.     Palpations: Abdomen is soft.     Tenderness: There is no abdominal tenderness.  Musculoskeletal:     Cervical back: Normal range of motion.     Right lower leg: No edema.     Left lower leg: No edema.  Lymphadenopathy:     Cervical: No cervical adenopathy.  Skin:    General: Skin is warm and dry.     Capillary Refill: Capillary refill takes less than 2 seconds.     Coloration: Skin is not jaundiced.  Neurological:     General: No focal deficit present.     Mental Status: He is alert and oriented to person, place, and time.  Psychiatric:        Mood and Affect: Mood normal.        Behavior: Behavior normal.    Diabetic foot exam was performed.  No deformities or other abnormal visual findings.  Posterior tibialis and dorsalis pulse intact bilaterally.  Intact to touch and monofilament testing bilaterally.    Last CBC Lab Results  Component Value Date   WBC  6.8 12/11/2021  HGB 10.9 (L) 12/11/2021   HCT 34.3 (L) 12/11/2021   MCV 86 12/11/2021   MCH 27.3 12/11/2021   RDW 13.8 12/11/2021   PLT 372 83/41/9622   Last metabolic panel Lab Results  Component Value Date   GLUCOSE 272 (H) 10/27/2021   NA 133 (L) 10/27/2021   K 3.6 10/27/2021   CL 100 10/27/2021   CO2 23 10/27/2021   BUN 20 10/27/2021   CREATININE 1.63 (H) 10/27/2021   GFRNONAA 47 (L) 10/27/2021   CALCIUM 8.9 10/27/2021   PROT 7.0 10/04/2021   ALBUMIN 4.1 10/04/2021   LABGLOB 1.9 09/10/2021   AGRATIO 2.4 (H) 09/10/2021   BILITOT 0.4 10/04/2021   ALKPHOS 74 10/04/2021   AST 16 10/04/2021   ALT 13 10/04/2021   ANIONGAP 10 10/27/2021   Last lipids Lab Results  Component Value Date   CHOL 82 10/13/2021   HDL 30 (L) 10/13/2021   LDLCALC NOT CALCULATED 10/13/2021   TRIG 318 (H) 10/13/2021   CHOLHDL 2.7 10/13/2021   Last hemoglobin A1c Lab Results  Component Value Date   HGBA1C 9.6 (H) 12/11/2021   Last thyroid functions Lab Results  Component Value Date   TSH 1.790 10/18/2013     Assessment & Plan:   Problem List Items Addressed This Visit       DM type 2 causing vascular disease ([place])    A1c has increased to 9.6.  His latest regimen includes Lantus 10 units nightly, NovoLog 5 units 3 times daily with meals, metformin 1000 mg twice daily, and Jardiance 10 mg daily.  He states that he has not been taking metformin because he thought he was supposed to stop the medication.  He plans to resume taking it this evening.  He reports morning readings in the mid 200s, lunchtime readings in the 300s, and evening readings around 400.  He denies symptoms of polyuria/polydipsia currently. -Increase Lantus to 18 units nightly -Increase NovoLog to 10 units 3 times daily with meals -Continue Jardiance at current dose -Resume metformin -Diabetic foot exam completed today -He states that he has been seen for a diabetic eye exam within the last year and will bring  documentation to his next appointment -Follow-up in 2 weeks for telephone encounter to review recent blood sugar readings      Iron deficiency anemia    He endorsed symptoms of fatigue and unintentional 40 pound weight loss at his last appointment.  Lab work revealed iron deficiency.  I recommended oral iron supplementation, however he did not tolerate this due to GI side effects.  He would benefit from iron infusions.  We will coordinate this.  I also recommend that he follow-up with GI for repeat evaluation.  His last colonoscopy was in 2019.  Tubular adenoma noted and repeat colonoscopy recommended for 5 years.      Return in about 2 weeks (around 01/08/2022) for T2DM.    [name], MD

## 2021-12-25 NOTE — Assessment & Plan Note (Signed)
He endorsed symptoms of fatigue and unintentional 40 pound weight loss at his last appointment.  Lab work revealed iron deficiency.  I recommended oral iron supplementation, however he did not tolerate this due to GI side effects.  He would benefit from iron infusions.  We will coordinate this.  I also recommend that he follow-up with GI for repeat evaluation.  His last colonoscopy was in 2019.  Tubular adenoma noted and repeat colonoscopy recommended for 5 years.

## 2021-12-27 LAB — BASIC METABOLIC PANEL
BUN/Creatinine Ratio: 8 — ABNORMAL LOW (ref 10–24)
BUN: 7 mg/dL — ABNORMAL LOW (ref 8–27)
CO2: 22 mmol/L (ref 20–29)
Calcium: 9.1 mg/dL (ref 8.6–10.2)
Chloride: 100 mmol/L (ref 96–106)
Creatinine, Ser: 0.84 mg/dL (ref 0.76–1.27)
Glucose: 419 mg/dL — ABNORMAL HIGH (ref 70–99)
Potassium: 4.2 mmol/L (ref 3.5–5.2)
Sodium: 136 mmol/L (ref 134–144)
eGFR: 97 mL/min/{1.73_m2} (ref >59)

## 2022-01-08 ENCOUNTER — Ambulatory Visit (INDEPENDENT_AMBULATORY_CARE_PROVIDER_SITE_OTHER): Payer: Medicare HMO | Admitting: Internal Medicine

## 2022-01-08 ENCOUNTER — Encounter: Payer: Self-pay | Admitting: Internal Medicine

## 2022-01-08 DIAGNOSIS — E611 Iron deficiency: Secondary | ICD-10-CM | POA: Diagnosis not present

## 2022-01-08 DIAGNOSIS — E1159 Type 2 diabetes mellitus with other circulatory complications: Secondary | ICD-10-CM

## 2022-01-08 MED ORDER — EMPAGLIFLOZIN 25 MG PO TABS: 25.0000 mg | ORAL_TABLET | Freq: Every day | ORAL | 2 refills | Status: AC

## 2022-01-08 MED ORDER — NOVOLOG 100 UNIT/ML IJ SOLN: 14.0000 [IU] | Freq: Three times a day (TID) | INTRAMUSCULAR | 11 refills | Status: DC

## 2022-01-08 NOTE — Progress Notes (Signed)
   Established Patient Telephone Visit  Virtual Visit via Telephone Note  I connected with Jimmy Duran on 01/08/22 at  2:40 PM EST by telephone and verified that I am speaking with the correct person using two identifiers.  Location: Patient: [address] [place], [place] [address] Provider: [phone] [place], [place] [address]   I discussed the limitations, risks, security and privacy concerns of performing an evaluation and management service by telephone and the availability of in person appointments. I also discussed with the patient that there may be a patient responsible charge related to this service. The patient expressed understanding and agreed to proceed.   History of Present Illness:  Jimmy Duran is evaluated today through telephone encounter for routine follow-up.  He is a 66 year old male with a past medical history significant for HTN, CAD, carotid artery disease, PAD, T2DM, COPD, and HLD.  He was last seen by me on 10/24 for routine follow-up.  At that time he reported that he had stopped taking metformin because he thought he was supposed to discontinue the medication.  He reported morning blood sugar readings in the mid 200s, lunchtime readings in the 300s, and evening readings around 400.  Lantus was increased to 18 units nightly, and NovoLog increased to 10 units 3 times daily with meals.  I recommended that he resume metformin and continue Jardiance at current dose.  2-week follow-up was arranged to review blood sugar readings.  We are also coordinating iron infusions as he has a history of iron deficiency anemia and has not been able to tolerate oral iron supplementation.  I also recommended that he follow-up with GI given unintentional 40 pound weight loss and iron deficiency anemia.  Jimmy Duran is doing well today.  He is asymptomatic without acute concerns.  We reviewed his recent blood sugar readings as noted below.  Observations/Objective:  AM - 145, 202, 243,   Lunch - 207, 145, 199 Dinner - 212, 165, 155 Night time - 237, 202, 293   Assessment and Plan:  Type 2 diabetes mellitus His most recent A1c was 9.6 on 10/10. Current insulin regimen: Lantus 18 units nightly, and NovoLog 10 units 3 times daily with meals He is also prescribed Jardiance 10 mg daily and metformin 1000 mg twice daily -Overall his blood sugar readings are improving.  He denies recent readings in the 300s or 400s.  He continues to deny polyuria/polydipsia. -He is in agreement with increasing NovoLog to 14 units 3 times daily with meals.  He would like to keep his Lantus dose the same for now. -We will also increase Jardiance to 25 mg daily -Follow-up in 4 weeks for reassessment  Follow Up Instructions:    I discussed the assessment and treatment plan with the patient. The patient was provided an opportunity to ask questions and all were answered. The patient agreed with the plan and demonstrated an understanding of the instructions.   The patient was advised to call back or seek an in-person evaluation if the symptoms worsen or if the condition fails to improve as anticipated.  I provided 12 minutes of non-face-to-face time during this encounter.   [name], MD

## 2022-01-14 ENCOUNTER — Other Ambulatory Visit: Payer: Self-pay

## 2022-01-15 ENCOUNTER — Encounter (HOSPITAL_COMMUNITY)
Admission: RE | Admit: 2022-01-15 | Discharge: 2022-01-15 | Disposition: A | Discharge status: Home / Self Care | Payer: Medicare HMO | Source: Ambulatory Visit | Attending: Internal Medicine | Admitting: Internal Medicine

## 2022-01-15 VITALS — BP 122/71 | HR 91 | Temp 98.3°F | Resp 16 | Ht 67.0 in

## 2022-01-15 DIAGNOSIS — Z01818 Encounter for other preprocedural examination: Secondary | ICD-10-CM | POA: Insufficient documentation

## 2022-01-15 DIAGNOSIS — D509 Iron deficiency anemia, unspecified: Secondary | ICD-10-CM

## 2022-01-15 MED ORDER — ACETAMINOPHEN 325 MG PO TABS
650.0000 mg | ORAL_TABLET | Freq: Once | ORAL | Status: AC
  Administered 2022-01-15: 650 mg via ORAL
  Filled 2022-01-15: qty 2

## 2022-01-15 MED ORDER — DIPHENHYDRAMINE HCL 25 MG PO CAPS
25.0000 mg | ORAL_CAPSULE | Freq: Once | ORAL | Status: AC
  Administered 2022-01-15: 25 mg via ORAL
  Filled 2022-01-15: qty 1

## 2022-01-15 MED ORDER — SODIUM CHLORIDE 0.9 % IV SOLN
125.0000 mg | Freq: Every day | INTRAVENOUS | Status: DC
  Administered 2022-01-15: 125 mg via INTRAVENOUS
  Filled 2022-01-15: qty 10

## 2022-01-15 NOTE — Progress Notes (Signed)
Diagnosis: Iron Deficiency Anemia  Provider:  Marland Kitchen MD  Procedure: Infusion  IV Type: Peripheral, IV Location: L Antecubital  Ferrlecit, Dose: 125 mg   Post Infusion IV Care: Observation period completed and Peripheral IV Discontinued  Discharge: Condition: Good, Destination: Home . AVS provided to patient.   Performed by:  Hughie Closs, RN

## 2022-01-22 ENCOUNTER — Encounter (HOSPITAL_COMMUNITY)
Admission: RE | Admit: 2022-01-22 | Discharge: 2022-01-22 | Disposition: A | Discharge status: Home / Self Care | Payer: Medicare HMO | Source: Ambulatory Visit | Attending: Internal Medicine | Admitting: Internal Medicine

## 2022-01-22 VITALS — BP 112/67 | HR 92 | Temp 97.7°F | Resp 18

## 2022-01-22 DIAGNOSIS — D509 Iron deficiency anemia, unspecified: Secondary | ICD-10-CM

## 2022-01-22 DIAGNOSIS — Z01818 Encounter for other preprocedural examination: Secondary | ICD-10-CM | POA: Diagnosis not present

## 2022-01-22 MED ORDER — SODIUM CHLORIDE 0.9 % IV SOLN
125.0000 mg | Freq: Every day | INTRAVENOUS | Status: DC
  Administered 2022-01-22: 125 mg via INTRAVENOUS
  Filled 2022-01-22: qty 10

## 2022-01-22 MED ORDER — ACETAMINOPHEN 325 MG PO TABS: 650.0000 mg | ORAL_TABLET | Freq: Once | ORAL | Status: DC

## 2022-01-22 MED ORDER — DIPHENHYDRAMINE HCL 25 MG PO CAPS: 25.0000 mg | ORAL_CAPSULE | Freq: Once | ORAL | Status: DC

## 2022-01-22 NOTE — Progress Notes (Signed)
Diagnosis: Iron Deficiency Anemia  Provider:  Marland Kitchen MD  Procedure: Infusion  IV Type: Peripheral, IV Location: R Forearm  Ferrlecit, Dose: 125 mg  Infusion Start Time: 4944  Infusion Stop Time: 7395  Post Infusion IV Care: Peripheral IV Discontinued  Discharge: Condition: Good, Destination: Home . AVS provided to patient.   Performed by:  Baxter Hire, RN

## 2022-01-23 ENCOUNTER — Ambulatory Visit (INDEPENDENT_AMBULATORY_CARE_PROVIDER_SITE_OTHER): Payer: Medicare HMO | Admitting: Internal Medicine

## 2022-01-23 ENCOUNTER — Encounter: Payer: Self-pay | Admitting: Internal Medicine

## 2022-01-23 VITALS — BP 96/60 | HR 98 | Ht 67.0 in | Wt 140.0 lb

## 2022-01-23 DIAGNOSIS — H65192 Other acute nonsuppurative otitis media, left ear: Secondary | ICD-10-CM | POA: Diagnosis not present

## 2022-01-23 DIAGNOSIS — H6690 Otitis media, unspecified, unspecified ear: Secondary | ICD-10-CM | POA: Insufficient documentation

## 2022-01-23 MED ORDER — OFLOXACIN 0.3 % OT SOLN: 5.0000 [drp] | Freq: Every day | OTIC | 0 refills | Status: AC

## 2022-01-23 NOTE — Patient Instructions (Signed)
It was a pleasure to see you today.  Thank you for giving [name] the opportunity to be involved in your care.  Below is a brief recap of your visit and next steps.  We will plan to see you again in December.  Summary Start ofloxacin 5 drops daily x 10 days in the left ear Follow up in December

## 2022-01-23 NOTE — Assessment & Plan Note (Addendum)
Presenting today for evaluation of left ear discomfort.  His symptoms have been present x10 days and he has previously completed a 7-day course of amoxicillin for treatment of acute otitis media.  His symptoms have not improved.  He has a bulging left TM on exam today, concerning for persistent AOM. -I have prescribed ofloxacin otic drops x10 days -He has follow-up scheduled with me for early December.

## 2022-01-23 NOTE — Progress Notes (Signed)
   Acute Office Visit  Subjective:     Patient ID: Jimmy Duran, male    DOB: March 21, 1955, 66 y.o.   MRN: [id number]  Chief Complaint  Patient presents with   Ear Pain   Mr. Weitzman presents today for evaluation of left ear discomfort.  He reports a 10-day history of pain in his left ear.  He was previously evaluated at an urgent care prescribed amoxicillin x7 days for treatment of acute otitis media.  Despite taking the medication prescribed, his symptoms have not improved.  He continues to endorse pain in his left ear.  He denies hearing loss or drainage in the left ear.  He additionally denies fever/chills.  Mr. Sparr describes pain in the left side of his forehead.  The symptoms have previously been related to allergies.  Review of Systems  Constitutional:  Negative for chills and fever.  HENT:  Positive for ear pain (Left ear).   All other systems reviewed and are negative.     Objective:    BP 96/60   Pulse 98   Ht '5\' 7"'$  (1.702 m)   Wt 140 lb (63.5 kg)   SpO2 92%   BMI 21.93 kg/m    Physical Exam HENT:     Right Ear: Tympanic membrane, ear canal and external ear normal.     Left Ear: Ear canal and external ear normal. Tympanic membrane is bulging.       Assessment & Plan:   Problem List Items Addressed This Visit       Acute otitis media - Primary    Presenting today for evaluation of left ear discomfort.  His symptoms have been present x10 days and he has previously completed a 7-day course of amoxicillin for treatment of acute otitis media.  His symptoms have not improved.  He has a bulging left TM on exam today, concerning for persistent AOM. -I have prescribed ofloxacin otic drops x10 days -He has follow-up scheduled with me for early December.       Meds ordered this encounter  Medications   ofloxacin (FLOXIN OTIC) 0.3 % OTIC solution    Sig: Place 5 drops into the left ear daily for 10 days.    Dispense:  5 mL    Refill:  0   Return if symptoms  worsen or fail to improve.  [name], MD

## 2022-01-29 ENCOUNTER — Other Ambulatory Visit: Payer: Self-pay

## 2022-01-29 ENCOUNTER — Emergency Department (HOSPITAL_COMMUNITY)
Admission: EM | Admit: 2022-01-29 | Discharge: 2022-01-29 | Disposition: A | Discharge status: Home / Self Care | Payer: Medicare HMO | Attending: Emergency Medicine | Admitting: Emergency Medicine

## 2022-01-29 ENCOUNTER — Encounter (HOSPITAL_COMMUNITY): Payer: Self-pay

## 2022-01-29 ENCOUNTER — Encounter (HOSPITAL_COMMUNITY)
Admission: RE | Admit: 2022-01-29 | Discharge: 2022-01-29 | Disposition: A | Discharge status: Home / Self Care | Payer: Medicare HMO | Source: Ambulatory Visit | Attending: Internal Medicine | Admitting: Internal Medicine

## 2022-01-29 VITALS — BP 77/53 | HR 120 | Temp 97.4°F | Resp 18

## 2022-01-29 DIAGNOSIS — E119 Type 2 diabetes mellitus without complications: Secondary | ICD-10-CM | POA: Insufficient documentation

## 2022-01-29 DIAGNOSIS — R42 Dizziness and giddiness: Secondary | ICD-10-CM | POA: Insufficient documentation

## 2022-01-29 DIAGNOSIS — Z7984 Long term (current) use of oral hypoglycemic drugs: Secondary | ICD-10-CM | POA: Insufficient documentation

## 2022-01-29 DIAGNOSIS — Z7982 Long term (current) use of aspirin: Secondary | ICD-10-CM | POA: Diagnosis not present

## 2022-01-29 DIAGNOSIS — R000 Tachycardia, unspecified: Secondary | ICD-10-CM | POA: Insufficient documentation

## 2022-01-29 DIAGNOSIS — I959 Hypotension, unspecified: Secondary | ICD-10-CM | POA: Diagnosis present

## 2022-01-29 DIAGNOSIS — Z7902 Long term (current) use of antithrombotics/antiplatelets: Secondary | ICD-10-CM | POA: Insufficient documentation

## 2022-01-29 DIAGNOSIS — Z79899 Other long term (current) drug therapy: Secondary | ICD-10-CM | POA: Insufficient documentation

## 2022-01-29 DIAGNOSIS — D509 Iron deficiency anemia, unspecified: Secondary | ICD-10-CM

## 2022-01-29 LAB — URINALYSIS, ROUTINE W REFLEX MICROSCOPIC
Bilirubin Urine: NEGATIVE
Glucose, UA: >=500 mg/dL — AB
Hgb urine dipstick: NEGATIVE
Ketones, ur: NEGATIVE mg/dL
Leukocytes,Ua: NEGATIVE
Nitrite: NEGATIVE
Protein, ur: NEGATIVE mg/dL
Specific Gravity, Urine: 1.025 (ref 1.005–1.030)
pH: 5 (ref 5.0–8.0)

## 2022-01-29 LAB — BASIC METABOLIC PANEL
Anion gap: 10 (ref 5–15)
BUN: 22 mg/dL (ref 8–23)
CO2: 20 mmol/L — ABNORMAL LOW (ref 22–32)
Calcium: 9.3 mg/dL (ref 8.9–10.3)
Chloride: 105 mmol/L (ref 98–111)
Creatinine, Ser: 1.31 mg/dL — ABNORMAL HIGH (ref 0.61–1.24)
GFR, Estimated: >60 mL/min (ref >60)
Glucose, Bld: 204 mg/dL — ABNORMAL HIGH (ref 70–99)
Potassium: 4.9 mmol/L (ref 3.5–5.1)
Sodium: 135 mmol/L (ref 135–145)

## 2022-01-29 LAB — CBC WITH DIFFERENTIAL/PLATELET
Abs Immature Granulocytes: 0.07 10*3/uL (ref 0.00–0.07)
Basophils Absolute: 0.1 10*3/uL (ref 0.0–0.1)
Basophils Relative: 1 %
Eosinophils Absolute: 0.1 10*3/uL (ref 0.0–0.5)
Eosinophils Relative: 1 %
HCT: 32.3 % — ABNORMAL LOW (ref 39.0–52.0)
Hemoglobin: 10.1 g/dL — ABNORMAL LOW (ref 13.0–17.0)
Immature Granulocytes: 1 %
Lymphocytes Relative: 6 %
Lymphs Abs: 0.8 10*3/uL (ref 0.7–4.0)
MCH: 27.7 pg (ref 26.0–34.0)
MCHC: 31.3 g/dL (ref 30.0–36.0)
MCV: 88.5 fL (ref 80.0–100.0)
Monocytes Absolute: 0.6 10*3/uL (ref 0.1–1.0)
Monocytes Relative: 5 %
Neutro Abs: 11.5 10*3/uL — ABNORMAL HIGH (ref 1.7–7.7)
Neutrophils Relative %: 86 %
Platelets: 498 10*3/uL — ABNORMAL HIGH (ref 150–400)
RBC: 3.65 MIL/uL — ABNORMAL LOW (ref 4.22–5.81)
RDW: 19.6 % — ABNORMAL HIGH (ref 11.5–15.5)
WBC: 13.2 10*3/uL — ABNORMAL HIGH (ref 4.0–10.5)
nRBC: 0 % (ref 0.0–0.2)

## 2022-01-29 MED ORDER — SODIUM CHLORIDE 0.9 % IV SOLN
125.0000 mg | Freq: Every day | INTRAVENOUS | Status: DC
  Filled 2022-01-29: qty 10

## 2022-01-29 MED ORDER — ACETAMINOPHEN 325 MG PO TABS: 650.0000 mg | ORAL_TABLET | Freq: Once | ORAL | Status: DC

## 2022-01-29 MED ORDER — SODIUM CHLORIDE 0.9 % IV BOLUS
500.0000 mL | Freq: Once | INTRAVENOUS | Status: AC
  Administered 2022-01-29: 500 mL via INTRAVENOUS

## 2022-01-29 MED ORDER — DIPHENHYDRAMINE HCL 25 MG PO CAPS: 25.0000 mg | ORAL_CAPSULE | Freq: Once | ORAL | Status: DC

## 2022-01-29 NOTE — ED Triage Notes (Signed)
Patient was in [facility] for iron transfusion and BP was too low WDL's now.

## 2022-01-29 NOTE — Progress Notes (Addendum)
Pt arrived today for Iron infusion. Pt stated he felt weak and light headed/ dizzy. Vitals signs checked. PT 84/64 while sitting and 77/53 while standing w/ a HR of 120. Pt stated he felt dizzy. Jonelle Sidle, DNP made aware. Pt agreed to be evaluated in the ER.

## 2022-01-29 NOTE — ED Provider Notes (Signed)
[facility] EMERGENCY DEPARTMENT Provider Note   CSN: [id number] Arrival date & time: 01/29/22  1410     History  Chief Complaint  Patient presents with   Low BP    Patient was in cancer center for iron transfusion and BP too low    Jimmy Duran is a 67 y.o. male.  HPI This patient is a 66 year old male who has a history of iron deficiency anemia, he is on aspirin and Plavix, he is a diabetic on Jardiance and insulin, he is on antihypertensives including lisinopril and a small dose of metoprolol daily.  He presents from the oncology center where he was getting an iron transfusion because of hypotension.  The patient is completely asymptomatic and denies any headache blurred vision numbness weakness sore throat coughing shortness of breath chest pain abdominal pain nausea vomiting or diarrhea, no swelling of the legs, no rashes of the skin.  He does get occasionally lightheaded but that has been going on for about 2 years since having a carotid endarterectomy.  He has no symptoms at this time, he was surprised to be told that his blood pressure was a little bit low and states that he will range from the 90s up to the 110's.  He has taken his medications as per usual.  He is not having any rectal bleeding.  Blood pressures measured in the upper 70s and 80s.  When orthostatics were performed in the clinic he did have a significant drop with some associated tachycardia according to the report.    Home Medications Prior to Admission medications   Medication Sig Start Date End Date Taking? Authorizing Provider  albuterol (VENTOLIN HFA) 108 (90 Base) MCG/ACT inhaler Inhale 2 puffs into the lungs every 6 (six) hours as needed for wheezing or shortness of breath. 08/09/21   Renee Rival, FNP  amoxicillin (AMOXIL) 875 MG tablet Take 875 mg by mouth 2 (two) times daily. 01/17/22   [provider]  aspirin EC 81 MG tablet Take 1 tablet (81 mg total) by mouth daily at 6 (six) AM.  Swallow whole. 10/14/21   Rhyne, Hulen Shouts, PA-C  clopidogrel (PLAVIX) 75 MG tablet Take 1 tablet (75 mg total) by mouth daily. 08/09/21 08/09/22  PasedaDewaine Conger, FNP  Continuous Blood Gluc Sensor (FREESTYLE LIBRE 3 SENSOR) MISC 1 each by Does not apply route every 14 (fourteen) days. Place 1 sensor on the skin every 14 days. Use to check glucose continuously 12/19/21   [name], MD  diphenhydrAMINE (BENADRYL) 25 MG tablet Take 25 mg by mouth daily as needed for allergies.     [provider]  empagliflozin (JARDIANCE) 25 MG TABS tablet Take 1 tablet (25 mg total) by mouth daily before breakfast. 01/08/22 04/08/22  [name], MD  famotidine (PEPCID) 20 MG tablet Take 1 tablet (20 mg total) by mouth 2 (two) times daily. 05/08/21   Annitta Needs, NP  Glucagon (GVOKE HYPOPEN 2-PACK) 1 MG/0.2ML SOAJ Inject 1 mg into the skin as needed (hypoglycemia). 12/11/21   [name], MD  insulin glargine (LANTUS) 100 UNIT/ML injection Inject 0.18 mLs (18 Units total) into the skin daily. 12/25/21   [name], MD  lansoprazole (PREVACID) 30 MG capsule Take 1 capsule (30 mg total) by mouth 2 (two) times daily before a meal. 10/19/21   Erenest Rasher, PA-C  lisinopril (ZESTRIL) 20 MG tablet Take 1 tablet (20 mg total) by mouth daily. 11/22/21 11/22/22  [name]  M, PA-C  metFORMIN (GLUCOPHAGE) 1000 MG tablet Take 1 tablet (1,000 mg total) by mouth 2 (two) times daily with a meal. 09/11/21   Paseda, Dewaine Conger, FNP  metoprolol tartrate (LOPRESSOR) 25 MG tablet Take 0.5 tablets (12.5 mg total) by mouth 2 (two) times daily. 10/29/21   Satira Sark, MD  NOVOLOG 100 UNIT/ML injection Inject 14 Units into the skin 3 (three) times daily with meals. 01/08/22   [name], MD  ofloxacin (FLOXIN OTIC) 0.3 % OTIC solution Place 5 drops into the left ear daily for 10 days. 01/23/22 02/02/22  [name], MD  rosuvastatin (CRESTOR) 40 MG tablet Take 1 tablet (40 mg total) by  mouth daily. 08/14/21   Satira Sark, MD      Allergies    Patient has no known allergies.    Review of Systems   Review of Systems  All other systems reviewed and are negative.   Physical Exam Updated Vital Signs BP 96/68 (BP Location: Left Arm)   Pulse 88   Temp 98.6 F (37 C) (Oral)   Resp (!) 22   Ht 1.702 m ('5\' 7"'$ )   Wt 61.2 kg   SpO2 95%   BMI 21.14 kg/m  Physical Exam Vitals and nursing note reviewed.  Constitutional:      General: He is not in acute distress.    Appearance: He is well-developed.  HENT:     Head: Normocephalic and atraumatic.     Mouth/Throat:     Pharynx: No oropharyngeal exudate.  Eyes:     General: No scleral icterus.       Right eye: No discharge.        Left eye: No discharge.     Conjunctiva/sclera: Conjunctivae normal.     Pupils: Pupils are equal, round, and reactive to light.  Neck:     Thyroid: No thyromegaly.     Vascular: No JVD.     Comments: No carotid bruits, well-healed carotid endarterectomy scars bilaterally Cardiovascular:     Rate and Rhythm: Normal rate and regular rhythm.     Heart sounds: Normal heart sounds. No murmur heard.    No friction rub. No gallop.  Pulmonary:     Effort: Pulmonary effort is normal. No respiratory distress.     Breath sounds: Normal breath sounds. No wheezing or rales.  Abdominal:     General: Bowel sounds are normal. There is no distension.     Palpations: Abdomen is soft. There is no mass.     Tenderness: There is no abdominal tenderness.  Musculoskeletal:        General: No tenderness. Normal range of motion.     Cervical back: Normal range of motion and neck supple.     Right lower leg: No edema.     Left lower leg: No edema.  Lymphadenopathy:     Cervical: No cervical adenopathy.  Skin:    General: Skin is warm and dry.     Findings: No erythema or rash.  Neurological:     Mental Status: He is alert.     Coordination: Coordination normal.     Comments: Normal speech  coordination and strength in all 4 extremities, normal level of alertness  Psychiatric:        Behavior: Behavior normal.     ED Results / Procedures / Treatments   Labs (all labs ordered are listed, but only abnormal results are displayed) Labs Reviewed  BASIC METABOLIC PANEL - Abnormal;  Notable for the following components:      Result Value   CO2 20 (*)    Glucose, Bld 204 (*)    Creatinine, Ser 1.31 (*)    All other components within normal limits  CBC WITH DIFFERENTIAL/PLATELET - Abnormal; Notable for the following components:   WBC 13.2 (*)    RBC 3.65 (*)    Hemoglobin 10.1 (*)    HCT 32.3 (*)    RDW 19.6 (*)    Platelets 498 (*)    Neutro Abs 11.5 (*)    All other components within normal limits  URINALYSIS, ROUTINE W REFLEX MICROSCOPIC - Abnormal; Notable for the following components:   Glucose, UA >=500 (*)    Bacteria, UA RARE (*)    All other components within normal limits    EKG EKG Interpretation  Date/Time:  Tuesday January 29 2022 14:21:04 EST Ventricular Rate:  85 PR Interval:  167 QRS Duration: 100 QT Interval:  365 QTC Calculation: 434 R Axis:   60 Text Interpretation: Age not entered, assumed to be  66 years old for purpose of ECG interpretation Sinus rhythm Confirmed by Noemi Chapel [phone]) on 01/29/2022 4:35:29 PM  Radiology No results found.  Procedures Procedures    Medications Ordered in ED Medications  sodium chloride 0.9 % bolus 500 mL (0 mLs Intravenous Stopped 01/29/22 1701)    ED Course/ Medical Decision Making/ A&P                           Medical Decision Making Amount and/or Complexity of Data Reviewed Labs: ordered. ECG/medicine tests: ordered.   This patient presents to the ED for concern of possible hypotension, this involves an extensive number of treatment options, and is a complaint that carries with it a high risk of complications and morbidity.  The differential diagnosis includes dehydration, anemia, acute  illness, medication overdose however the patient tells me that he is very good about taking his medications, he seems sincere and appropriate, he is asymptomatic and his blood pressure in the room is actually 122/68 with a pulse of 85.  He is not febrile he is not hypoxic and has no symptoms and a normal exam   Co morbidities that complicate the patient evaluation  History of significant for anemia   Additional history obtained:  Additional history obtained from electronic medical record External records from outside source obtained and reviewed including oncology visit notes from the day upstairs   Lab Tests:  I Ordered, and personally interpreted labs.  The pertinent results include: CBC metabolic panel, urinalysis showed glucose but no ketones and no protein, metabolic panel showed creatinine of 1.3, IV fluids were given.  CBC showed leukocytosis but no significant anemia, platelets were high normal.   Imaging Studies ordered:  None  Cardiac Monitoring: / EKG:  The patient was maintained on a cardiac monitor.  I personally viewed and interpreted the cardiac monitored which showed an underlying rhythm of: Sinus rhythm      Problem List / ED Course / Critical interventions / Medication management  The patient had some hypotension, this seemed to improve with IV fluids.  He did have some orthostatic changes numerically but not symptomatically and states that he always has this from time to time, he is not concerned and states that he is going to leave the ER no matter what we do or say.  He was willing to stay for some IV fluids and at  this point the patient is refusing to stay anymore.  He does appear well and has no symptoms, he has had no symptoms prompting the visit it was all purely because of some transient hypotension which again appears to be somewhat transient here.  He understands indications for return and is agreeable to do the same.  He understands that we wanted him to  stay for more stabilizing care and possibly admission   Social Determinants of Health:  Chronic anemia   Test / Admission - Considered:  Considered admission, patient refuses to stay in the ER any longer         Final Clinical Impression(s) / ED Diagnoses Final diagnoses:  Hypotension, unspecified hypotension type    Rx / DC Orders ED Discharge Orders     None         Noemi Chapel, MD 01/29/22 1809

## 2022-01-29 NOTE — Discharge Instructions (Signed)
Your testing looks OK, dehydrated, you need to drink plenty of clear liquids and fluids, return to the ER for severe or worsening symptoms, otherwise see your doctor within 48 hours for recheck.

## 2022-01-31 ENCOUNTER — Other Ambulatory Visit: Payer: Self-pay

## 2022-01-31 ENCOUNTER — Emergency Department (HOSPITAL_COMMUNITY)
Admission: EM | Admit: 2022-01-31 | Discharge: 2022-02-01 | Disposition: A | Discharge status: Home / Self Care | Payer: Medicare HMO | Attending: Emergency Medicine | Admitting: Emergency Medicine

## 2022-01-31 DIAGNOSIS — Z8673 Personal history of transient ischemic attack (TIA), and cerebral infarction without residual deficits: Secondary | ICD-10-CM | POA: Diagnosis not present

## 2022-01-31 DIAGNOSIS — J449 Chronic obstructive pulmonary disease, unspecified: Secondary | ICD-10-CM | POA: Insufficient documentation

## 2022-01-31 DIAGNOSIS — I2510 Atherosclerotic heart disease of native coronary artery without angina pectoris: Secondary | ICD-10-CM | POA: Insufficient documentation

## 2022-01-31 DIAGNOSIS — I10 Essential (primary) hypertension: Secondary | ICD-10-CM | POA: Diagnosis not present

## 2022-01-31 DIAGNOSIS — F17210 Nicotine dependence, cigarettes, uncomplicated: Secondary | ICD-10-CM | POA: Insufficient documentation

## 2022-01-31 DIAGNOSIS — L0201 Cutaneous abscess of face: Secondary | ICD-10-CM

## 2022-01-31 DIAGNOSIS — E119 Type 2 diabetes mellitus without complications: Secondary | ICD-10-CM | POA: Diagnosis not present

## 2022-01-31 MED ORDER — LIDOCAINE HCL (PF) 1 % IJ SOLN
10.0000 mL | Freq: Once | INTRAMUSCULAR | Status: AC
  Administered 2022-02-01: 10 mL
  Filled 2022-01-31: qty 10

## 2022-01-31 NOTE — ED Triage Notes (Signed)
Pt via POV with abscess to left cheek near corner of mouth x 2 days. He says it started as a pimple but never got a head and he was unable to pop it. Redness and swelling noted to area and pt states pain is currently 10/10. Took BC powder and excedrin at around 3pm with no relief.

## 2022-01-31 NOTE — ED Provider Notes (Signed)
[facility] Emergency Department Provider Note MRN:  [id number]  Arrival date & time: 02/01/22     Chief Complaint   Abscess   History of Present Illness   Jimmy Duran is a 66 y.o. year-old male with a history of AAA, coronary disease, COPD, CAD, tobacco use, diabetes presenting to the ED with chief complaint of abscess.  Abscess to the left cheek of the face, started as a pimple 2 days ago but has gotten worse and worse.  Tried bringing it to ahead with some warm compresses but not helping.  No fever, no other complaints.  Review of Systems  A thorough review of systems was obtained and all systems are negative except as noted in the HPI and PMH.   Patient's Health History    Past Medical History:  Diagnosis Date   AAA (abdominal aortic aneurysm) (HCC)    Arthritis    Carotid artery disease (HCC)    Colon polyp    COPD (chronic obstructive pulmonary disease) (HCC)    Coronary artery disease    Nonobstructive   Essential hypertension    GERD (gastroesophageal reflux disease)    Hemorrhoids    History of TIA (transient ischemic attack)    Low back pain    Lumbar radiculopathy    Mixed hyperlipidemia    Palpitations    Peripheral vascular disease ([place])    Shingles 09/23/2019   Type 2 diabetes mellitus ([place])     Past Surgical History:  Procedure Laterality Date   APPENDECTOMY  1970   BIOPSY  12/08/2017   Procedure: BIOPSY;  Surgeon: [name], MD;  [facility];  Service: Endoscopy;;  esophagus    BIOPSY  12/28/2020   Procedure: BIOPSY;  Surgeon: [name], MD;  [facility];  Service: Endoscopy;;   BIOPSY  07/12/2021   Procedure: BIOPSY;  Surgeon: [name], MD;  [facility];  Service: Endoscopy;;   BREAST SURGERY Right    Cyst resection on the right   CARDIAC CATHETERIZATION  1990's   in [place]. no stent placement   CATARACT EXTRACTION W/PHACO Right 06/18/2016   Procedure:  CATARACT EXTRACTION PHACO AND INTRAOCULAR LENS PLACEMENT ([place]);  Surgeon: [name], MD;  Location: [facility];  Service: Ophthalmology;  Laterality: Right;  [name] 35.7 AP% 16.6 CDE 5.94 Fluid pack lot # [id number]   CATARACT EXTRACTION W/PHACO Left 07/16/2016   Procedure: CATARACT EXTRACTION PHACO AND INTRAOCULAR LENS PLACEMENT (IOC);  Surgeon: [name], MD;  Location: [facility];  Service: Ophthalmology;  Laterality: Left;  [name] 51.8 AP% 12.7 CDE 6.58 Fluid Pack Lot # [id number]   CHOLECYSTECTOMY  1993   COLONOSCOPY  02/12/2007   BTD:[id number] friable anal canal hemorrhoids, otherwise normal rectum and colon   COLONOSCOPY N/A 07/16/2012   RMR: Colonic diverticulosis. 4 mm tubular adenoma   COLONOSCOPY WITH PROPOFOL N/A 12/08/2017   Dr. Gala Romney: sigmoid and descending colon diverticulosis, transverse colon polyp (TUBULAR ADENOMA)   ENDARTERECTOMY Right 10/08/2019   Procedure: RIGHT CAROTID ENDARTERECTOMY;  Surgeon: Rosetta Posner, MD;  Location: [facility];  Service: Vascular;  Laterality: Right;   ESOPHAGOGASTRODUODENOSCOPY (EGD) WITH ESOPHAGEAL DILATION N/A 07/16/2012   RMR: +Candida esophagitis, Erosive reflux esophagiits. Schatizi's ring status post dilation. Hiatal hernia. Antral erosions status post biopsy.    ESOPHAGOGASTRODUODENOSCOPY (EGD) WITH PROPOFOL N/A 12/08/2017   Dr. Gala Romney: esophagitis, query EOE but negative for increased eosinophils on path, small hiatal hernia, normal stomach, normal duodenum, PAS stain  with rare yeast forms on stain. Empiric dilation   ESOPHAGOGASTRODUODENOSCOPY (EGD) WITH PROPOFOL N/A 12/28/2020   esophagitis without bleeding, s/p biopsy and dilation. Antral erosions s/p biopsy.   ESOPHAGOGASTRODUODENOSCOPY (EGD) WITH PROPOFOL N/A 07/12/2021   Surgeon: [name], MD;  Cobblestoning appearance of distal esophagus, slight narrowing of distal esophagus s/p dilation with 40 Fr and biopsy, gastric erosions biopsied, normal examined duodenum.  Esophageal  biopsy with reactive squamous mucosa, gastric biopsy with reactive gastropathy, negative for H. pylori.   EYE SURGERY Bilateral 2018   cataract   HERNIA REPAIR     JOINT REPLACEMENT     KNEE SURGERY     Left knee arthroscopy torn medial meniscus grade 4 chondral changes medial femoral condyle tibial plateau   MALONEY DILATION N/A 12/08/2017   Procedure: MALONEY DILATION;  Surgeon: [name], MD;  [facility];  Service: Endoscopy;  Laterality: N/A;   MALONEY DILATION N/A 12/28/2020   Procedure: Venia Minks DILATION;  Surgeon: [name], MD;  [facility];  Service: Endoscopy;  Laterality: N/A;   MALONEY DILATION N/A 07/12/2021   Procedure: Venia Minks DILATION;  Surgeon: [name], MD;  [facility];  Service: Endoscopy;  Laterality: N/A;   PATCH ANGIOPLASTY Right 10/08/2019   Procedure: PATCH ANGIOPLASTY of the right common carotid artery using hemashield plaltinum finesse patch;  Surgeon: Rosetta Posner, MD;  Location: [facility];  Service: Vascular;  Laterality: Right;   PERIPHERAL VASCULAR INTERVENTION  07/17/2020   Procedure: PERIPHERAL VASCULAR INTERVENTION;  Surgeon: Waynetta Sandy, MD;  Location: [facility];  Service: Cardiovascular;;   POLYPECTOMY  12/08/2017   Procedure: POLYPECTOMY;  Surgeon: [name], MD;  [facility];  Service: Endoscopy;;  colon   TOTAL KNEE ARTHROPLASTY Left 04/27/2012   Procedure: TOTAL KNEE ARTHROPLASTY;  Surgeon: Carole Civil, MD;  Location: [facility];  Service: Orthopedics;  Laterality: Left;   TRANSCAROTID ARTERY REVASCULARIZATION  Left 10/12/2021   Procedure: Left Transcarotid Artery Revascularization;  Surgeon: Waynetta Sandy, MD;  Location: [facility];  Service: Vascular;  Laterality: Left;   [place] N/A 07/17/2020   Procedure: mesenteric ANGIOGRAPHY;  Surgeon: Waynetta Sandy, MD;  Location: [facility];   Service: Cardiovascular;  Laterality: N/A;    Family History  Problem Relation Age of Onset   Dementia Mother    Hypertension Mother    Colon polyps Mother    Osteoporosis Mother    Cancer Father    Hypertension Father    Coronary artery disease Father        CABG in his 66's   Heart attack Father    Cancer - Lung Father    Heart disease Brother        has a pacemaker   Emphysema Maternal Grandmother    Cancer - Lung Maternal Grandfather    Cancer Paternal Grandmother    Heart attack Paternal Grandfather    Colon cancer Other        maternal great aunt   Colon cancer Other        paternal great aunt    Social History   Socioeconomic History   Marital status: Legally Separated    Spouse name: Not on file   Number of children: 5   Years of education: Not on file   Highest education level: Not on file  Occupational History   Occupation: Warehouse manager  Employer: [employer]  Tobacco Use   Smoking status: Every Day    Packs/day: 0.50    Years: 53.00    Total pack years: 26.50    Types: Cigarettes   Smokeless tobacco: Former    Types: Chew   Tobacco comments:    0.5 pk per day currently   Vaping Use   Vaping Use: Former  Substance and Sexual Activity   Alcohol use: Not Currently    Alcohol/week: 0.0 standard drinks of alcohol    Comment: socially "beer and tomato juice"    Drug use: No   Sexual activity: Yes    Birth control/protection: None  Other Topics Concern   Not on file  Social History Narrative   Lives alone.    Social Determinants of Health   Financial Resource Strain: Not on file  Food Insecurity: Not on file  Transportation Needs: Not on file  Physical Activity: Not on file  Stress: Not on file  Social Connections: Not on file  Intimate Partner Violence: Not on file     Physical Exam   Vitals:   01/31/22 2217  BP: 130/84  Pulse: (!) 113  Resp: 18  Temp: 98.7 F (37.1 C)  SpO2: 98%    CONSTITUTIONAL: Chronically ill  appearing, NAD NEURO/PSYCH:  Alert and oriented x 3, no focal deficits EYES:  eyes equal and reactive ENT/NECK:  no LAD, no JVD CARDIO: Regular rate, well-perfused, normal S1 and S2 PULM:  CTAB no wheezing or rhonchi GI/GU:  non-distended, non-tender MSK/SPINE:  No gross deformities, no edema SKIN: Large fluctuant nodule to the left cheek   *Additional and/or pertinent findings included in MDM below  Diagnostic and Interventional Summary    EKG Interpretation  Date/Time:    Ventricular Rate:    PR Interval:    QRS Duration:   QT Interval:    QTC Calculation:   R Axis:     Text Interpretation:         Labs Reviewed - No data to display  No orders to display    Medications  lidocaine (PF) (XYLOCAINE) 1 % injection 10 mL (has no administration in time range)  sulfamethoxazole-trimethoprim (BACTRIM DS) 800-160 MG per tablet 1 tablet (has no administration in time range)     Procedures  /  Critical Care .Marland KitchenIncision and Drainage  Date/Time: 02/01/2022 12:13 AM  Performed by: Maudie Flakes, MD Authorized by: Maudie Flakes, MD   Consent:    Consent obtained:  Verbal   Consent given by:  Patient   Risks, benefits, and alternatives were discussed: yes     Risks discussed:  Bleeding, damage to other organs, infection, incomplete drainage and pain   Alternatives discussed:  No treatment Universal protocol:    Procedure explained and questions answered to patient or proxy's satisfaction: yes     Immediately prior to procedure, a time out was called: yes     Patient identity confirmed:  Verbally with patient Location:    Type:  Abscess   Size:  3cm   Location: face. Pre-procedure details:    Skin preparation:  Chlorhexidine with alcohol Sedation:    Sedation type:  None Anesthesia:    Anesthesia method:  Local infiltration   Local anesthetic:  Lidocaine 1% w/o epi Procedure type:    Complexity:  Complex Procedure details:    Incision types:  Cruciate    Incision depth:  Subcutaneous   Wound management:  Probed and deloculated, irrigated with saline, debrided and extensive  cleaning   Drainage:  Bloody and purulent   Drainage amount:  Scant   Wound treatment:  Wound left open   Packing materials:  None Post-procedure details:    Procedure completion:  Tolerated well, no immediate complications   ED Course and Medical Decision Making  Initial Impression and Ddx Exam is suspicious for large facial abscess within the cheek, will need I&D, will need antibiotics due to likely surrounding facial cellulitis.  Past medical/surgical history that increases complexity of ED encounter: Diabetes, tobacco use  Interpretation of Diagnostics Laboratory and/or imaging options to aid in the diagnosis/care of the patient were considered.  After careful history and physical examination, it was determined that there was no indication for diagnostics at this time.  Patient Reassessment and Ultimate Disposition/Management     Drained as described above, appropriate for discharge.  Patient management required discussion with the following services or consulting groups:  None  Complexity of Problems Addressed Acute complicated illness or Injury  Additional Data Reviewed and Analyzed Further history obtained from: Further history from spouse/family member  Additional Factors Impacting ED Encounter Risk Prescriptions  Barth Kirks. [place] Small, [place] [email or web address]  Final Clinical Impressions(s) / ED Diagnoses     ICD-10-CM   1. Facial abscess  L02.01       ED Discharge Orders          Ordered    sulfamethoxazole-trimethoprim (BACTRIM DS) 800-160 MG tablet  2 times daily        02/01/22 0012             Discharge Instructions Discussed with and Provided to Patient:     Discharge Instructions      You were evaluated in the Emergency Department and after careful evaluation, we did  not find any emergent condition requiring admission or further testing in the hospital.  Your exam/testing today was overall reassuring.  We drained your facial abscess here in the emergency department.  Take the Bactrim antibiotic as directed over the next week.  Tylenol or Motrin for pain.  Please return to the Emergency Department if you experience any worsening of your condition.  Thank you for allowing [name] to be a part of your care.        Maudie Flakes, MD 02/01/22 [phone]

## 2022-02-01 MED ORDER — SULFAMETHOXAZOLE-TRIMETHOPRIM 800-160 MG PO TABS
1.0000 | ORAL_TABLET | Freq: Once | ORAL | Status: AC
  Administered 2022-02-01: 1 via ORAL
  Filled 2022-02-01: qty 1

## 2022-02-01 MED ORDER — SULFAMETHOXAZOLE-TRIMETHOPRIM 800-160 MG PO TABS: 1.0000 | ORAL_TABLET | Freq: Two times a day (BID) | ORAL | 0 refills | Status: AC

## 2022-02-01 NOTE — Discharge Instructions (Signed)
You were evaluated in the Emergency Department and after careful evaluation, we did not find any emergent condition requiring admission or further testing in the hospital.  Your exam/testing today was overall reassuring.  We drained your facial abscess here in the emergency department.  Take the Bactrim antibiotic as directed over the next week.  Tylenol or Motrin for pain.  Please return to the Emergency Department if you experience any worsening of your condition.  Thank you for allowing [name] to be a part of your care.

## 2022-02-05 ENCOUNTER — Ambulatory Visit: Payer: Medicare HMO | Admitting: Gastroenterology

## 2022-02-05 ENCOUNTER — Encounter: Payer: Self-pay | Admitting: Internal Medicine

## 2022-02-05 ENCOUNTER — Encounter: Payer: Self-pay | Admitting: Gastroenterology

## 2022-02-05 ENCOUNTER — Ambulatory Visit (INDEPENDENT_AMBULATORY_CARE_PROVIDER_SITE_OTHER): Payer: Medicare HMO | Admitting: Internal Medicine

## 2022-02-05 VITALS — BP 103/67 | HR 103 | Temp 97.3°F | Ht 67.0 in | Wt 138.6 lb

## 2022-02-05 VITALS — BP 92/58 | HR 113 | Ht 67.0 in | Wt 138.2 lb

## 2022-02-05 DIAGNOSIS — K2100 Gastro-esophageal reflux disease with esophagitis, without bleeding: Secondary | ICD-10-CM

## 2022-02-05 DIAGNOSIS — D509 Iron deficiency anemia, unspecified: Secondary | ICD-10-CM | POA: Diagnosis not present

## 2022-02-05 DIAGNOSIS — E1159 Type 2 diabetes mellitus with other circulatory complications: Secondary | ICD-10-CM

## 2022-02-05 DIAGNOSIS — I10 Essential (primary) hypertension: Secondary | ICD-10-CM

## 2022-02-05 DIAGNOSIS — Z86010 Personal history of colonic polyps: Secondary | ICD-10-CM

## 2022-02-05 MED ORDER — LISINOPRIL 5 MG PO TABS: 5.0000 mg | ORAL_TABLET | Freq: Every day | ORAL | 2 refills | Status: DC

## 2022-02-05 MED ORDER — TIRZEPATIDE 2.5 MG/0.5ML ~~LOC~~ SOAJ: 2.5000 mg | SUBCUTANEOUS | 0 refills | Status: DC

## 2022-02-05 NOTE — Progress Notes (Signed)
Established Patient Office Visit  Subjective   Patient ID: Jimmy Duran, male    DOB: [birth year]  Age: 66 y.o. MRN: [id number]  Chief Complaint  Patient presents with   Follow-up   Jimmy Duran returns to care today for diabetes follow-up.  He was last seen by me for diabetes follow-up on 11/7 at which time NovoLog was increased to 14 units 3 times daily with meals and Jardiance was increased to 25 mg daily.  In the interim Jimmy Duran has received multiple iron infusions and was then evaluated by me on 11/22 for acute otitis media, which was treated with ofloxacin eardrops.  He presented for an iron infusion on 11/28 and was symptomatically hypotensive.  He was referred to the emergency department for treatment at that time and was released from the ED the same day.  He then returned to the emergency department on 11/30 for an abscess on the left side of his face.  This was treated with I&D and he was started on Bactrim.  He has also been seen by GI in follow-up.  Today Jimmy Duran reports feeling well.  He states that his abscess is healing.  He has not had any additional episodes of dizziness/lightheadedness.   Past Medical History:  Diagnosis Date   AAA (abdominal aortic aneurysm) (HCC)    Arthritis    Carotid artery disease (HCC)    Colon polyp    COPD (chronic obstructive pulmonary disease) (HCC)    Coronary artery disease    Nonobstructive   Essential hypertension    GERD (gastroesophageal reflux disease)    Hemorrhoids    History of TIA (transient ischemic attack)    Low back pain    Lumbar radiculopathy    Mixed hyperlipidemia    Palpitations    Peripheral vascular disease ([place])    Shingles 09/23/2019   Type 2 diabetes mellitus ([place])    Past Surgical History:  Procedure Laterality Date   APPENDECTOMY  1970   BIOPSY  12/08/2017   Procedure: BIOPSY;  Surgeon: [name], MD;  [facility];  Service: Endoscopy;;  esophagus    BIOPSY  12/28/2020    Procedure: BIOPSY;  Surgeon: [name], MD;  [facility];  Service: Endoscopy;;   BIOPSY  07/12/2021   Procedure: BIOPSY;  Surgeon: [name], MD;  [facility];  Service: Endoscopy;;   BREAST SURGERY Right    Cyst resection on the right   CARDIAC CATHETERIZATION  1990's   in [place]. no stent placement   CATARACT EXTRACTION W/PHACO Right 06/18/2016   Procedure: CATARACT EXTRACTION PHACO AND INTRAOCULAR LENS PLACEMENT ([place]);  Surgeon: [name], MD;  Location: [facility];  Service: Ophthalmology;  Laterality: Right;  [name] 35.7 AP% 16.6 CDE 5.94 Fluid pack lot # [id number]   CATARACT EXTRACTION W/PHACO Left 07/16/2016   Procedure: CATARACT EXTRACTION PHACO AND INTRAOCULAR LENS PLACEMENT ([facility]);  Surgeon: [name], MD;  Location: [facility];  Service: Ophthalmology;  Laterality: Left;  [name] 51.8 AP% 12.7 CDE 6.58 Fluid Pack Lot # [id number]   CHOLECYSTECTOMY  1993   COLONOSCOPY  02/12/2007   [id number] friable anal canal hemorrhoids, otherwise normal rectum and colon   COLONOSCOPY N/A 07/16/2012   RMR: Colonic diverticulosis. 4 mm tubular adenoma   COLONOSCOPY WITH PROPOFOL N/A 12/08/2017   Dr. Gala Romney: sigmoid and descending colon diverticulosis, transverse colon polyp (TUBULAR ADENOMA)   ENDARTERECTOMY Right 10/08/2019   Procedure: RIGHT CAROTID ENDARTERECTOMY;  Surgeon:  Rosetta Posner, MD;  Location[facility];  Service: Vascular;  Laterality: Right;   ESOPHAGOGASTRODUODENOSCOPY (EGD) WITH ESOPHAGEAL DILATION N/A 07/16/2012   RMR: +Candida esophagitis, Erosive reflux esophagiits. Schatizi's ring status post dilation. Hiatal hernia. Antral erosions status post biopsy.    ESOPHAGOGASTRODUODENOSCOPY (EGD) WITH PROPOFOL N/A 12/08/2017   Dr. Gala Romney: esophagitis, query EOE but negative for increased eosinophils on path, small hiatal hernia, normal stomach, normal duodenum, PAS stain with rare yeast forms on stain. Empiric dilation    ESOPHAGOGASTRODUODENOSCOPY (EGD) WITH PROPOFOL N/A 12/28/2020   esophagitis without bleeding, s/p biopsy and dilation. Antral erosions s/p biopsy.   ESOPHAGOGASTRODUODENOSCOPY (EGD) WITH PROPOFOL N/A 07/12/2021   Surgeon: [name], MD;  Cobblestoning appearance of distal esophagus, slight narrowing of distal esophagus s/p dilation with 5 Fr and biopsy, gastric erosions biopsied, normal examined duodenum.  Esophageal biopsy with reactive squamous mucosa, gastric biopsy with reactive gastropathy, negative for H. pylori.   EYE SURGERY Bilateral 2018   cataract   HERNIA REPAIR     JOINT REPLACEMENT     KNEE SURGERY     Left knee arthroscopy torn medial meniscus grade 4 chondral changes medial femoral condyle tibial plateau   MALONEY DILATION N/A 12/08/2017   Procedure: MALONEY DILATION;  Surgeon: [name], MD;  [facility];  Service: Endoscopy;  Laterality: N/A;   MALONEY DILATION N/A 12/28/2020   Procedure: Venia Minks DILATION;  Surgeon: [name], MD;  [facility];  Service: Endoscopy;  Laterality: N/A;   MALONEY DILATION N/A 07/12/2021   Procedure: Venia Minks DILATION;  Surgeon: [name], MD;  [facility];  Service: Endoscopy;  Laterality: N/A;   PATCH ANGIOPLASTY Right 10/08/2019   Procedure: PATCH ANGIOPLASTY of the right common carotid artery using hemashield plaltinum finesse patch;  Surgeon: Rosetta Posner, MD;  Location: [facility];  Service: Vascular;  Laterality: Right;   PERIPHERAL VASCULAR INTERVENTION  07/17/2020   Procedure: PERIPHERAL VASCULAR INTERVENTION;  Surgeon: Waynetta [facility], MD;  Location: [facility];  Service: Cardiovascular;;   POLYPECTOMY  12/08/2017   Procedure: POLYPECTOMY;  Surgeon: [name], MD;  [facility];  Service: Endoscopy;;  colon   TOTAL KNEE ARTHROPLASTY Left 04/27/2012   Procedure: TOTAL KNEE ARTHROPLASTY;  Surgeon: Carole Civil, MD;  Location: [facility];  Service:  Orthopedics;  Laterality: Left;   TRANSCAROTID ARTERY REVASCULARIZATION  Left 10/12/2021   Procedure: Left Transcarotid Artery Revascularization;  Surgeon: Waynetta Sandy, MD;  Location: [place];  Service: Vascular;  Laterality: Left;   [place] N/A 07/17/2020   Procedure: mesenteric ANGIOGRAPHY;  Surgeon: Waynetta [facility], MD;  Location: [facility];  Service: Cardiovascular;  Laterality: N/A;   Social History   Tobacco Use   Smoking status: Every Day    Packs/day: 0.50    Years: 53.00    Total pack years: 26.50    Types: Cigarettes   Smokeless tobacco: Former    Types: Chew   Tobacco comments:    0.5 pk per day currently   Vaping Use   Vaping Use: Former  Substance Use Topics   Alcohol use: Not Currently    Alcohol/week: 0.0 standard drinks of alcohol    Comment: socially "beer and tomato juice"    Drug use: No   Family History  Problem Relation Age of Onset   Dementia Mother    Hypertension Mother    Colon  polyps Mother    Osteoporosis Mother    Cancer Father    Hypertension Father    Coronary artery disease Father        CABG in his 39's   Heart attack Father    Cancer - Lung Father    Heart disease Brother        has a pacemaker   Emphysema Maternal Grandmother    Cancer - Lung Maternal Grandfather    Cancer Paternal Grandmother    Heart attack Paternal Grandfather    Colon cancer Other        maternal great aunt   Colon cancer Other        paternal great aunt   No Known Allergies  Review of Systems  Constitutional:  Negative for chills and fever.  HENT:  Negative for sore throat.   Respiratory:  Negative for cough and shortness of breath.   Cardiovascular:  Negative for chest pain, palpitations and leg swelling.  Gastrointestinal:  Negative for abdominal pain, blood in stool, constipation, diarrhea, nausea and vomiting.  Genitourinary:  Negative for dysuria and hematuria.   Musculoskeletal:  Negative for myalgias.  Skin:  Negative for itching and rash.  Neurological:  Negative for dizziness and headaches.  Psychiatric/Behavioral:  Negative for depression and suicidal ideas.      Objective:     BP (!) 92/58   Pulse (!) 113   Ht '5\' 7"'$  (1.702 m)   Wt 138 lb 3.2 oz (62.7 kg)   SpO2 97%   BMI 21.65 kg/m  BP Readings from Last 3 Encounters:  02/07/22 116/67  02/05/22 (!) 92/58  02/05/22 103/67   Physical Exam Vitals reviewed.  Constitutional:      Appearance: Normal appearance.  HENT:     Head: Normocephalic and atraumatic.     Comments: Bandaid covering site of previous abscess on left cheek    Right Ear: External ear normal.     Left Ear: External ear normal.     Nose: Nose normal.     Mouth/Throat:     Mouth: Mucous membranes are moist.     Pharynx: Oropharynx is clear. No oropharyngeal exudate or posterior oropharyngeal erythema.  Eyes:     General: No scleral icterus.    Extraocular Movements: Extraocular movements intact.     Conjunctiva/sclera: Conjunctivae normal.     Pupils: Pupils are equal, round, and reactive to light.  Cardiovascular:     Rate and Rhythm: Normal rate and regular rhythm.     Pulses: Normal pulses.     Heart sounds: Normal heart sounds.  Pulmonary:     Effort: Pulmonary effort is normal.     Breath sounds: Normal breath sounds. No wheezing, rhonchi or rales.  Abdominal:     General: Abdomen is flat. Bowel sounds are normal. There is no distension.     Palpations: Abdomen is soft.     Tenderness: There is no abdominal tenderness.  Musculoskeletal:     Cervical back: Normal range of motion.     Right lower leg: No edema.     Left lower leg: No edema.  Lymphadenopathy:     Cervical: No cervical adenopathy.  Skin:    General: Skin is warm and dry.     Capillary Refill: Capillary refill takes less than 2 seconds.     Coloration: Skin is not jaundiced.  Neurological:     General: No focal deficit present.      Mental Status: He is alert and oriented  to person, place, and time.  Psychiatric:        Mood and Affect: Mood normal.        Behavior: Behavior normal.    Last CBC Lab Results  Component Value Date   WBC 13.2 (H) 01/29/2022   HGB 10.1 (L) 01/29/2022   HCT 32.3 (L) 01/29/2022   MCV 88.5 01/29/2022   MCH 27.7 01/29/2022   RDW 19.6 (H) 01/29/2022   PLT 498 (H) 83/38/2505   Last metabolic panel Lab Results  Component Value Date   GLUCOSE 204 (H) 01/29/2022   NA 135 01/29/2022   K 4.9 01/29/2022   CL 105 01/29/2022   CO2 20 (L) 01/29/2022   BUN 22 01/29/2022   CREATININE 1.31 (H) 01/29/2022   GFRNONAA >60 01/29/2022   CALCIUM 9.3 01/29/2022   PROT 7.0 10/04/2021   ALBUMIN 4.1 10/04/2021   LABGLOB 1.9 09/10/2021   AGRATIO 2.4 (H) 09/10/2021   BILITOT 0.4 10/04/2021   ALKPHOS 74 10/04/2021   AST 16 10/04/2021   ALT 13 10/04/2021   ANIONGAP 10 01/29/2022   Last lipids Lab Results  Component Value Date   CHOL 82 10/13/2021   HDL 30 (L) 10/13/2021   LDLCALC NOT CALCULATED 10/13/2021   TRIG 318 (H) 10/13/2021   CHOLHDL 2.7 10/13/2021   Last hemoglobin A1c Lab Results  Component Value Date   HGBA1C 9.6 (H) 12/11/2021   Last thyroid functions Lab Results  Component Value Date   TSH 1.790 10/18/2013     Assessment & Plan:   Problem List Items Addressed This Visit       Essential hypertension, benign - Primary    Given recent symptomatic hypotension and borderline hypotensive reading today, we will decrease lisinopril to 5 mg daily. -Follow-up in 4 weeks      DM type 2 causing vascular disease ([place])    A1c increased to 9.6 in October.  His most recent insulin regimen consists of Lantus 18 units nightly and NovoLog 14 units 3 times daily with meals.  He is also prescribed Jardiance 25 mg daily and metformin 1000 mg twice daily.  He has been checking his blood sugar several times daily.  On review today, ranges are from 100-300s. -Start Mounjaro 2.5 mg weekly  injections today -No changes to current insulin regimen -Follow-up in 4 weeks       Return in about 4 weeks (around 03/05/2022) for HTN, DM, repeat iron studies.    [name], MD

## 2022-02-05 NOTE — Progress Notes (Signed)
GI Office Note    Referring Provider: [name], MD Primary Care Physician:  [name], MD  Primary Gastroenterologist: Garfield Cornea, MD  Chief Complaint   Chief Complaint  Patient presents with   Follow-up    3 month follow up      History of Present Illness   Jimmy Duran is a 66 y.o. male presenting today for follow-up.  Patient last seen August 2023.  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 Heath Gold. Donzetta Matters), mild narrowing of IMA origin, adenomatous colon polyps due for surveillance 2024, dysphagia.  Got off BCs for three months, used some in the last several weeks for ear ache but not daily. Prevacid working better than Protonix. No longer having abdominal pain.  Some days 2-3 bms daily and sometimes will skip couple of days. Stools dark on iron before. No melena, brbpr. Was eating well but with flare of facial abscess and ear ache. No dysphagia. Notes his PCP found him to have iron deficiency. He did not tolerate iron pills. He has been for 2 IV iron infusions. Recently his maternal cousin diagnosed with stage IV colon cancer, late 27s. He has other distant relatives with history of colon cancer. Was due for surveillance colonoscopy later in 2024.    October 04, 2021, hemoglobin normal at 14.2.  He had stenting for left ICA stenosis on October 12, 2021.  Hemoglobin 12.0 that day.  Prior to discharge his hemoglobin was 11.3.  PCP updated labs December 11, 2021, hemoglobin 10.9, ferritin 20, iron sats 11%, TIBC 412.  Latest labs from November 28, hemoglobin 10.1.  BPE March 2023: Smooth narrowing at the GE junction, obstructing 12.5 mm barium tablet.  Age-related dysmotility.   EGD completed 07/12/2021: Cobblestoning appearance of distal esophagus, slight narrowing of distal esophagus s/p dilation with 36 Fr and biopsy, gastric erosions biopsied, normal examined duodenum.   Esophageal biopsy with reactive squamous mucosa, gastric biopsy with reactive gastropathy, negative for H. pylori.   Medications   Current Outpatient Medications  Medication Sig Dispense Refill   albuterol (VENTOLIN HFA) 108 (90 Base) MCG/ACT inhaler Inhale 2 puffs into the lungs every 6 (six) hours as needed for wheezing or shortness of breath. 8 g 2   aspirin EC 81 MG tablet Take 1 tablet (81 mg total) by mouth daily at 6 (six) AM. Swallow whole. 30 tablet 12   clopidogrel (PLAVIX) 75 MG tablet Take 1 tablet (75 mg total) by mouth daily. 30 tablet 11   Continuous Blood Gluc Sensor (FREESTYLE LIBRE 3 SENSOR) MISC 1 each by Does not apply route every 14 (fourteen) days. Place 1 sensor on the skin every 14 days. Use to check glucose continuously 2 each 2   diphenhydrAMINE (BENADRYL) 25 MG tablet Take 25 mg by mouth daily as needed for allergies.      empagliflozin (JARDIANCE) 25 MG TABS tablet Take 1 tablet (25 mg total) by mouth daily before breakfast. 30 tablet 2   famotidine (PEPCID) 20 MG tablet Take 1 tablet (20 mg total) by mouth 2 (two) times daily. 60 tablet 3   Glucagon (GVOKE HYPOPEN 2-PACK) 1 MG/0.2ML SOAJ Inject 1 mg into the skin as needed (hypoglycemia). 0.2 mL 1   insulin glargine (LANTUS) 100 UNIT/ML injection Inject 0.18 mLs (18 Units total) into the skin daily. 10 mL PRN   lansoprazole (PREVACID) 30 MG capsule Take 1 capsule (30  mg total) by mouth 2 (two) times daily before a meal. 60 capsule 3   lisinopril (ZESTRIL) 20 MG tablet Take 1 tablet (20 mg total) by mouth daily. 90 tablet 3   metFORMIN (GLUCOPHAGE) 1000 MG tablet Take 1 tablet (1,000 mg total) by mouth 2 (two) times daily with a meal. 60 tablet 3   metoprolol tartrate (LOPRESSOR) 25 MG tablet Take 0.5 tablets (12.5 mg total) by mouth 2 (two) times daily.     NOVOLOG 100 UNIT/ML injection Inject 14 Units into the skin 3 (three) times daily with meals. 10 mL 11   rosuvastatin (CRESTOR) 40 MG tablet Take 1 tablet (40 mg  total) by mouth daily. 90 tablet 3   sulfamethoxazole-trimethoprim (BACTRIM DS) 800-160 MG tablet Take 1 tablet by mouth 2 (two) times daily for 7 days. 14 tablet 0   No current facility-administered medications for this visit.    Allergies   Allergies as of 02/05/2022   (No Known Allergies)   Past Medical History:  Diagnosis Date   AAA (abdominal aortic aneurysm) (HCC)    Arthritis    Carotid artery disease (HCC)    Colon polyp    COPD (chronic obstructive pulmonary disease) (HCC)    Coronary artery disease    Nonobstructive   Essential hypertension    GERD (gastroesophageal reflux disease)    Hemorrhoids    History of TIA (transient ischemic attack)    Low back pain    Lumbar radiculopathy    Mixed hyperlipidemia    Palpitations    Peripheral vascular disease ([place])    Shingles 09/23/2019   Type 2 diabetes mellitus ([place])    Past Surgical History:  Procedure Laterality Date   APPENDECTOMY  1970   BIOPSY  12/08/2017   Procedure: BIOPSY;  Surgeon: [name], MD;  [facility];  Service: Endoscopy;;  esophagus    BIOPSY  12/28/2020   Procedure: BIOPSY;  Surgeon: [name], MD;  [facility];  Service: Endoscopy;;   BIOPSY  07/12/2021   Procedure: BIOPSY;  Surgeon: [name], MD;  [facility];  Service: Endoscopy;;   BREAST SURGERY Right    Cyst resection on the right   CARDIAC CATHETERIZATION  1990's   in [place]. no stent placement   CATARACT EXTRACTION W/PHACO Right 06/18/2016   Procedure: CATARACT EXTRACTION PHACO AND INTRAOCULAR LENS PLACEMENT ([place]);  Surgeon: [name], MD;  Location: [facility];  Service: Ophthalmology;  Laterality: Right;  [name] 35.7 AP% 16.6 CDE 5.94 Fluid pack lot # [id number]   CATARACT EXTRACTION W/PHACO Left 07/16/2016   Procedure: CATARACT EXTRACTION PHACO AND INTRAOCULAR LENS PLACEMENT (I[facility]);  Surgeon: [name], MD;  Location: [facility];  Service: Ophthalmology;  Laterality:  Left;  [name] 51.8 AP% 12.7 CDE 6.58 Fluid Pack Lot # [id number]   CHOLECYSTECTOMY  1993   COLONOSCOPY  02/12/2007   LKG:[id number] friable anal canal hemorrhoids, otherwise normal rectum and colon   COLONOSCOPY N/A 07/16/2012   RMR: Colonic diverticulosis. 4 mm tubular adenoma   COLONOSCOPY WITH PROPOFOL N/A 12/08/2017   Dr. Gala Romney: sigmoid and descending colon diverticulosis, transverse colon polyp (TUBULAR ADENOMA)   ENDARTERECTOMY Right 10/08/2019   Procedure: RIGHT CAROTID ENDARTERECTOMY;  Surgeon: Rosetta Posner, MD;  Location: [facility];  Service: Vascular;  Laterality: Right;   ESOPHAGOGASTRODUODENOSCOPY (EGD) WITH ESOPHAGEAL DILATION N/A 07/16/2012   RMR: +Candida esophagitis, Erosive reflux esophagiits. Schatizi's ring status post dilation. Hiatal hernia. Antral erosions status post biopsy.  ESOPHAGOGASTRODUODENOSCOPY (EGD) WITH PROPOFOL N/A 12/08/2017   Dr. Gala Romney: esophagitis, query EOE but negative for increased eosinophils on path, small hiatal hernia, normal stomach, normal duodenum, PAS stain with rare yeast forms on stain. Empiric dilation   ESOPHAGOGASTRODUODENOSCOPY (EGD) WITH PROPOFOL N/A 12/28/2020   esophagitis without bleeding, s/p biopsy and dilation. Antral erosions s/p biopsy.   ESOPHAGOGASTRODUODENOSCOPY (EGD) WITH PROPOFOL N/A 07/12/2021   Surgeon: [name], MD;  Cobblestoning appearance of distal esophagus, slight narrowing of distal esophagus s/p dilation with 23 Fr and biopsy, gastric erosions biopsied, normal examined duodenum.  Esophageal biopsy with reactive squamous mucosa, gastric biopsy with reactive gastropathy, negative for H. pylori.   EYE SURGERY Bilateral 2018   cataract   HERNIA REPAIR     JOINT REPLACEMENT     KNEE SURGERY     Left knee arthroscopy torn medial meniscus grade 4 chondral changes medial femoral condyle tibial plateau   MALONEY DILATION N/A 12/08/2017   Procedure: MALONEY DILATION;  Surgeon: [name], MD;  [facility];   Service: Endoscopy;  Laterality: N/A;   MALONEY DILATION N/A 12/28/2020   Procedure: Venia Minks DILATION;  Surgeon: [name], MD;  [facility];  Service: Endoscopy;  Laterality: N/A;   MALONEY DILATION N/A 07/12/2021   Procedure: Venia Minks DILATION;  Surgeon: [name], MD;  [facility];  Service: Endoscopy;  Laterality: N/A;   PATCH ANGIOPLASTY Right 10/08/2019   Procedure: PATCH ANGIOPLASTY of the right common carotid artery using hemashield plaltinum finesse patch;  Surgeon: Rosetta Posner, MD;  Location: [place] [facility];  Service: Vascular;  Laterality: Right;   PERIPHERAL VASCULAR INTERVENTION  07/17/2020   Procedure: PERIPHERAL VASCULAR INTERVENTION;  Surgeon: Waynetta Sandy, MD;  Location: [place] [facility];  Service: Cardiovascular;;   POLYPECTOMY  12/08/2017   Procedure: POLYPECTOMY;  Surgeon: [name], MD;  [facility];  Service: Endoscopy;;  colon   TOTAL KNEE ARTHROPLASTY Left 04/27/2012   Procedure: TOTAL KNEE ARTHROPLASTY;  Surgeon: Carole Civil, MD;  Location: [facility];  Service: Orthopedics;  Laterality: Left;   TRANSCAROTID ARTERY REVASCULARIZATION  Left 10/12/2021   Procedure: Left Transcarotid Artery Revascularization;  Surgeon: Waynetta Sandy, MD;  Location: [place];  Service: Vascular;  Laterality: Left;   [place] N/A 07/17/2020   Procedure: mesenteric ANGIOGRAPHY;  Surgeon: Waynetta Sandy, MD;  Location: [place] [facility];  Service: Cardiovascular;  Laterality: N/A;   Family History  Problem Relation Age of Onset   Dementia Mother    Hypertension Mother    Colon polyps Mother    Osteoporosis Mother    Cancer Father    Hypertension Father    Coronary artery disease Father        CABG in his 56's   Heart attack Father    Cancer - Lung Father    Heart disease Brother        has a pacemaker   Emphysema Maternal Grandmother    Cancer - Lung  Maternal Grandfather    Cancer Paternal Grandmother    Heart attack Paternal Grandfather    Colon cancer Other        maternal great aunt   Colon cancer Other        paternal great aunt   Social History   Tobacco Use   Smoking status: Every Day    Packs/day: 0.50    Years: 53.00  Total pack years: 26.50    Types: Cigarettes   Smokeless tobacco: Former    Types: Chew   Tobacco comments:    0.5 pk per day currently   Vaping Use   Vaping Use: Former  Substance Use Topics   Alcohol use: Not Currently    Alcohol/week: 0.0 standard drinks of alcohol    Comment: socially "beer and tomato juice"    Drug use: No      Review of Systems   General: Negative for anorexia, weight loss, fever, chills, fatigue, weakness. ENT: Negative for hoarseness, difficulty swallowing , nasal congestion. CV: Negative for chest pain, angina, palpitations, dyspnea on exertion, peripheral edema.  Respiratory: Negative for dyspnea at rest, dyspnea on exertion, cough, sputum, wheezing.  GI: See history of present illness. GU:  Negative for dysuria, hematuria, urinary incontinence, urinary frequency, nocturnal urination.  Endo: Negative for unusual weight change.     Physical Exam   BP 103/67   Pulse (!) 103   Temp (!) 97.3 F (36.3 C)   Ht '5\' 7"'$  (1.702 m)   Wt 138 lb 9.6 oz (62.9 kg)   BMI 21.71 kg/m    General: Well-nourished, well-developed in no acute distress.  Eyes: No icterus. Mouth: Oropharyngeal mucosa moist and pink , no lesions erythema or exudate. Lungs: Clear to auscultation bilaterally.  Heart: Regular rate and rhythm, no murmurs rubs or gallops.  Abdomen: Bowel sounds are normal, nontender, nondistended, no hepatosplenomegaly or masses,  no abdominal bruits or hernia , no rebound or guarding.  Rectal: not performed Extremities: No lower extremity edema. No clubbing or deformities. Neuro: Alert and oriented x 4   Skin: Warm and dry, no jaundice.   Psych: Alert and  cooperative, normal mood and affect.  Labs   Lab Results  Component Value Date   CREATININE 1.31 (H) 01/29/2022   BUN 22 01/29/2022   NA 135 01/29/2022   K 4.9 01/29/2022   CL 105 01/29/2022   CO2 20 (L) 01/29/2022   Lab Results  Component Value Date   ALT 13 10/04/2021   AST 16 10/04/2021   ALKPHOS 74 10/04/2021   BILITOT 0.4 10/04/2021   Lab Results  Component Value Date   WBC 13.2 (H) 01/29/2022   HGB 10.1 (L) 01/29/2022   HCT 32.3 (L) 01/29/2022   MCV 88.5 01/29/2022   PLT 498 (H) 01/29/2022   Lab Results  Component Value Date   IRON 44 12/11/2021   TIBC 412 12/11/2021   FERRITIN 20 (L) 12/11/2021   No results found for: "VITAMINB12" No results found for: "FOLATE"  Imaging Studies   No results found.  Assessment   GERD: Doing much better on lansoprazole twice daily.  Also was able to come off [facility] powders for several months, just recently having to utilize some for ear pain.  His abdominal pain essentially resolved.  Dysphagia: Resolved status post dilation at time of EGD in May 2023.  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  Mild narrowing of the IMA origin.  Followed by vein and vascular.  Carotic artery stenting: Stenting August 2023 as outlined above.  IDA: Noted after carotid artery stenting back in August.  Has received IV iron x2 by PCP.  He is due for surveillance colonoscopy next year, discussed with patient today, consider updating colonoscopy sooner than planned given IDA.  We will discuss further with Dr. Gala Romney regarding timing as patient's stent was placed in August 2023.  PLAN   Continue lansoprazole 30 mg twice daily before meals. Monitor for any black or bloody stools. Update colonoscopy after the first of the year, to discuss timing with Dr. Gala Romney given latest stent placed in 10/2021. Typically would not plan to hold Plavix or ASA for colonoscopy but if further episode of  post polypectomy bleeding occurred, could potentially have to hold in that scenario.   Laureen Ochs. Bobby Rumpf, [place], [facility]

## 2022-02-05 NOTE — H&P (View-Only) (Signed)
GI Office Note    Referring Provider: [name], MD Primary Care Physician:  [name], MD  Primary Gastroenterologist: Garfield Cornea, MD  Chief Complaint   Chief Complaint  Patient presents with   Follow-up    3 month follow up      History of Present Illness   Jimmy Duran is a 66 y.o. male presenting today for follow-up.  Patient last seen August 2023.  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 Heath Gold. Donzetta Matters), mild narrowing of IMA origin, adenomatous colon polyps due for surveillance 2024, dysphagia.  Got off BCs for three months, used some in the last several weeks for ear ache but not daily. Prevacid working better than Protonix. No longer having abdominal pain.  Some days 2-3 bms daily and sometimes will skip couple of days. Stools dark on iron before. No melena, brbpr. Was eating well but with flare of facial abscess and ear ache. No dysphagia. Notes his PCP found him to have iron deficiency. He did not tolerate iron pills. He has been for 2 IV iron infusions. Recently his maternal cousin diagnosed with stage IV colon cancer, late 74s. He has other distant relatives with history of colon cancer. Was due for surveillance colonoscopy later in 2024.    October 04, 2021, hemoglobin normal at 14.2.  He had stenting for left ICA stenosis on October 12, 2021.  Hemoglobin 12.0 that day.  Prior to discharge his hemoglobin was 11.3.  PCP updated labs December 11, 2021, hemoglobin 10.9, ferritin 20, iron sats 11%, TIBC 412.  Latest labs from November 28, hemoglobin 10.1.  BPE March 2023: Smooth narrowing at the GE junction, obstructing 12.5 mm barium tablet.  Age-related dysmotility.   EGD completed 07/12/2021: Cobblestoning appearance of distal esophagus, slight narrowing of distal esophagus s/p dilation with 13 Fr and biopsy, gastric erosions biopsied, normal examined duodenum.   Esophageal biopsy with reactive squamous mucosa, gastric biopsy with reactive gastropathy, negative for H. pylori.   Medications   Current Outpatient Medications  Medication Sig Dispense Refill   albuterol (VENTOLIN HFA) 108 (90 Base) MCG/ACT inhaler Inhale 2 puffs into the lungs every 6 (six) hours as needed for wheezing or shortness of breath. 8 g 2   aspirin EC 81 MG tablet Take 1 tablet (81 mg total) by mouth daily at 6 (six) AM. Swallow whole. 30 tablet 12   clopidogrel (PLAVIX) 75 MG tablet Take 1 tablet (75 mg total) by mouth daily. 30 tablet 11   Continuous Blood Gluc Sensor (FREESTYLE LIBRE 3 SENSOR) MISC 1 each by Does not apply route every 14 (fourteen) days. Place 1 sensor on the skin every 14 days. Use to check glucose continuously 2 each 2   diphenhydrAMINE (BENADRYL) 25 MG tablet Take 25 mg by mouth daily as needed for allergies.      empagliflozin (JARDIANCE) 25 MG TABS tablet Take 1 tablet (25 mg total) by mouth daily before breakfast. 30 tablet 2   famotidine (PEPCID) 20 MG tablet Take 1 tablet (20 mg total) by mouth 2 (two) times daily. 60 tablet 3   Glucagon (GVOKE HYPOPEN 2-PACK) 1 MG/0.2ML SOAJ Inject 1 mg into the skin as needed (hypoglycemia). 0.2 mL 1   insulin glargine (LANTUS) 100 UNIT/ML injection Inject 0.18 mLs (18 Units total) into the skin daily. 10 mL PRN   lansoprazole (PREVACID) 30 MG capsule Take 1 capsule (30  mg total) by mouth 2 (two) times daily before a meal. 60 capsule 3   lisinopril (ZESTRIL) 20 MG tablet Take 1 tablet (20 mg total) by mouth daily. 90 tablet 3   metFORMIN (GLUCOPHAGE) 1000 MG tablet Take 1 tablet (1,000 mg total) by mouth 2 (two) times daily with a meal. 60 tablet 3   metoprolol tartrate (LOPRESSOR) 25 MG tablet Take 0.5 tablets (12.5 mg total) by mouth 2 (two) times daily.     NOVOLOG 100 UNIT/ML injection Inject 14 Units into the skin 3 (three) times daily with meals. 10 mL 11   rosuvastatin (CRESTOR) 40 MG tablet Take 1 tablet (40 mg  total) by mouth daily. 90 tablet 3   sulfamethoxazole-trimethoprim (BACTRIM DS) 800-160 MG tablet Take 1 tablet by mouth 2 (two) times daily for 7 days. 14 tablet 0   No current facility-administered medications for this visit.    Allergies   Allergies as of 02/05/2022   (No Known Allergies)   Past Medical History:  Diagnosis Date   AAA (abdominal aortic aneurysm) (HCC)    Arthritis    Carotid artery disease (HCC)    Colon polyp    COPD (chronic obstructive pulmonary disease) (HCC)    Coronary artery disease    Nonobstructive   Essential hypertension    GERD (gastroesophageal reflux disease)    Hemorrhoids    History of TIA (transient ischemic attack)    Low back pain    Lumbar radiculopathy    Mixed hyperlipidemia    Palpitations    Peripheral vascular disease ([place])    Shingles 09/23/2019   Type 2 diabetes mellitus ([place])    Past Surgical History:  Procedure Laterality Date   APPENDECTOMY  1970   BIOPSY  12/08/2017   Procedure: BIOPSY;  Surgeon: [name], MD;  [facility];  Service: Endoscopy;;  esophagus    BIOPSY  12/28/2020   Procedure: BIOPSY;  Surgeon: [name], MD;  [facility];  Service: Endoscopy;;   BIOPSY  07/12/2021   Procedure: BIOPSY;  Surgeon: [name], MD;  [facility];  Service: Endoscopy;;   BREAST SURGERY Right    Cyst resection on the right   CARDIAC CATHETERIZATION  1990's   in [place]. no stent placement   CATARACT EXTRACTION W/PHACO Right 06/18/2016   Procedure: CATARACT EXTRACTION PHACO AND INTRAOCULAR LENS PLACEMENT ([place]);  Surgeon: [name], MD;  Location: [facility];  Service: Ophthalmology;  Laterality: Right;  [name] 35.7 AP% 16.6 CDE 5.94 Fluid pack lot # [id number]   CATARACT EXTRACTION W/PHACO Left 07/16/2016   Procedure: CATARACT EXTRACTION PHACO AND INTRAOCULAR LENS PLACEMENT (IOC);  Surgeon: [name], MD;  Location: [facility];  Service: Ophthalmology;  Laterality:  Left;  [name] 51.8 AP% 12.7 CDE 6.58 Fluid Pack Lot # [id number]   CHOLECYSTECTOMY  1993   COLONOSCOPY  02/12/2007   EVO:[id number] friable anal canal hemorrhoids, otherwise normal rectum and colon   COLONOSCOPY N/A 07/16/2012   RMR: Colonic diverticulosis. 4 mm tubular adenoma   COLONOSCOPY WITH PROPOFOL N/A 12/08/2017   Dr. Gala Romney: sigmoid and descending colon diverticulosis, transverse colon polyp (TUBULAR ADENOMA)   ENDARTERECTOMY Right 10/08/2019   Procedure: RIGHT CAROTID ENDARTERECTOMY;  Surgeon: Rosetta Posner, MD;  [facility]: [facility];  Service: Vascular;  Laterality: Right;   ESOPHAGOGASTRODUODENOSCOPY (EGD) WITH ESOPHAGEAL DILATION N/A 07/16/2012   RMR: +Candida esophagitis, Erosive reflux esophagiits. Schatizi's ring status post dilation. Hiatal hernia. Antral erosions status post biopsy.  ESOPHAGOGASTRODUODENOSCOPY (EGD) WITH PROPOFOL N/A 12/08/2017   Dr. Gala Romney: esophagitis, query EOE but negative for increased eosinophils on path, small hiatal hernia, normal stomach, normal duodenum, PAS stain with rare yeast forms on stain. Empiric dilation   ESOPHAGOGASTRODUODENOSCOPY (EGD) WITH PROPOFOL N/A 12/28/2020   esophagitis without bleeding, s/p biopsy and dilation. Antral erosions s/p biopsy.   ESOPHAGOGASTRODUODENOSCOPY (EGD) WITH PROPOFOL N/A 07/12/2021   Surgeon: [name], MD;  Cobblestoning appearance of distal esophagus, slight narrowing of distal esophagus s/p dilation with 14 Fr and biopsy, gastric erosions biopsied, normal examined duodenum.  Esophageal biopsy with reactive squamous mucosa, gastric biopsy with reactive gastropathy, negative for H. pylori.   EYE SURGERY Bilateral 2018   cataract   HERNIA REPAIR     JOINT REPLACEMENT     KNEE SURGERY     Left knee arthroscopy torn medial meniscus grade 4 chondral changes medial femoral condyle tibial plateau   MALONEY DILATION N/A 12/08/2017   Procedure: MALONEY DILATION;  Surgeon: [name], MD;  [facility];   Service: Endoscopy;  Laterality: N/A;   MALONEY DILATION N/A 12/28/2020   Procedure: Venia Minks DILATION;  Surgeon: [name], MD;  [facility];  Service: Endoscopy;  Laterality: N/A;   MALONEY DILATION N/A 07/12/2021   Procedure: Venia Minks DILATION;  Surgeon: [name], MD;  [facility];  Service: Endoscopy;  Laterality: N/A;   PATCH ANGIOPLASTY Right 10/08/2019   Procedure: PATCH ANGIOPLASTY of the right common carotid artery using hemashield plaltinum finesse patch;  Surgeon: Rosetta Posner, MD;  Location: [facility];  Service: Vascular;  Laterality: Right;   PERIPHERAL VASCULAR INTERVENTION  07/17/2020   Procedure: PERIPHERAL VASCULAR INTERVENTION;  Surgeon: Waynetta Sandy, MD;  Location: [place] [facility];  Service: Cardiovascular;;   POLYPECTOMY  12/08/2017   Procedure: POLYPECTOMY;  Surgeon: [name], MD;  [facility];  Service: Endoscopy;;  colon   TOTAL KNEE ARTHROPLASTY Left 04/27/2012   Procedure: TOTAL KNEE ARTHROPLASTY;  Surgeon: Carole Civil, MD;  Location: [facility];  Service: Orthopedics;  Laterality: Left;   TRANSCAROTID ARTERY REVASCULARIZATION  Left 10/12/2021   Procedure: Left Transcarotid Artery Revascularization;  Surgeon: Waynetta Sandy, MD;  Location: [facility];  Service: Vascular;  Laterality: Left;   [place] N/A 07/17/2020   Procedure: mesenteric ANGIOGRAPHY;  Surgeon: Waynetta Sandy, MD;  Location: [place] [facility];  Service: Cardiovascular;  Laterality: N/A;   Family History  Problem Relation Age of Onset   Dementia Mother    Hypertension Mother    Colon polyps Mother    Osteoporosis Mother    Cancer Father    Hypertension Father    Coronary artery disease Father        CABG in his 34's   Heart attack Father    Cancer - Lung Father    Heart disease Brother        has a pacemaker   Emphysema Maternal Grandmother    Cancer - Lung  Maternal Grandfather    Cancer Paternal Grandmother    Heart attack Paternal Grandfather    Colon cancer Other        maternal great aunt   Colon cancer Other        paternal great aunt   Social History   Tobacco Use   Smoking status: Every Day    Packs/day: 0.50    Years: 53.00  Total pack years: 26.50    Types: Cigarettes   Smokeless tobacco: Former    Types: Chew   Tobacco comments:    0.5 pk per day currently   Vaping Use   Vaping Use: Former  Substance Use Topics   Alcohol use: Not Currently    Alcohol/week: 0.0 standard drinks of alcohol    Comment: socially "beer and tomato juice"    Drug use: No      Review of Systems   General: Negative for anorexia, weight loss, fever, chills, fatigue, weakness. ENT: Negative for hoarseness, difficulty swallowing , nasal congestion. CV: Negative for chest pain, angina, palpitations, dyspnea on exertion, peripheral edema.  Respiratory: Negative for dyspnea at rest, dyspnea on exertion, cough, sputum, wheezing.  GI: See history of present illness. GU:  Negative for dysuria, hematuria, urinary incontinence, urinary frequency, nocturnal urination.  Endo: Negative for unusual weight change.     Physical Exam   BP 103/67   Pulse (!) 103   Temp (!) 97.3 F (36.3 C)   Ht '5\' 7"'$  (1.702 m)   Wt 138 lb 9.6 oz (62.9 kg)   BMI 21.71 kg/m    General: Well-nourished, well-developed in no acute distress.  Eyes: No icterus. Mouth: Oropharyngeal mucosa moist and pink , no lesions erythema or exudate. Lungs: Clear to auscultation bilaterally.  Heart: Regular rate and rhythm, no murmurs rubs or gallops.  Abdomen: Bowel sounds are normal, nontender, nondistended, no hepatosplenomegaly or masses,  no abdominal bruits or hernia , no rebound or guarding.  Rectal: not performed Extremities: No lower extremity edema. No clubbing or deformities. Neuro: Alert and oriented x 4   Skin: Warm and dry, no jaundice.   Psych: Alert and  cooperative, normal mood and affect.  Labs   Lab Results  Component Value Date   CREATININE 1.31 (H) 01/29/2022   BUN 22 01/29/2022   NA 135 01/29/2022   K 4.9 01/29/2022   CL 105 01/29/2022   CO2 20 (L) 01/29/2022   Lab Results  Component Value Date   ALT 13 10/04/2021   AST 16 10/04/2021   ALKPHOS 74 10/04/2021   BILITOT 0.4 10/04/2021   Lab Results  Component Value Date   WBC 13.2 (H) 01/29/2022   HGB 10.1 (L) 01/29/2022   HCT 32.3 (L) 01/29/2022   MCV 88.5 01/29/2022   PLT 498 (H) 01/29/2022   Lab Results  Component Value Date   IRON 44 12/11/2021   TIBC 412 12/11/2021   FERRITIN 20 (L) 12/11/2021   No results found for: "VITAMINB12" No results found for: "FOLATE"  Imaging Studies   No results found.  Assessment   GERD: Doing much better on lansoprazole twice daily.  Also was able to come off [facility] powders for several months, just recently having to utilize some for ear pain.  His abdominal pain essentially resolved.  Dysphagia: Resolved status post dilation at time of EGD in May 2023.  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  Mild narrowing of the IMA origin.  Followed by vein and vascular.  Carotic artery stenting: Stenting August 2023 as outlined above.  IDA: Noted after carotid artery stenting back in August.  Has received IV iron x2 by PCP.  He is due for surveillance colonoscopy next year, discussed with patient today, consider updating colonoscopy sooner than planned given IDA.  We will discuss further with Dr. Gala Romney regarding timing as patient's stent was placed in August 2023.  PLAN   Continue lansoprazole 30 mg twice daily before meals. Monitor for any black or bloody stools. Update colonoscopy after the first of the year, to discuss timing with Dr. Gala Romney given latest stent placed in 10/2021. Typically would not plan to hold Plavix or ASA for colonoscopy but if further episode of  post polypectomy bleeding occurred, could potentially have to hold in that scenario.   Laureen Ochs. Bobby Rumpf, [place], [facility]

## 2022-02-05 NOTE — Patient Instructions (Signed)
It was a pleasure to see you today.  Thank you for giving [name] the opportunity to be involved in your care.  Below is a brief recap of your visit and next steps.  We will plan to see you again in 4 weeks.  Summary Start Mounjaro 2.5 mg weekly today No change to your insulin regimen Decrease lisinopril to 5 mg daily Follow up in 4 weeks

## 2022-02-05 NOTE — Patient Instructions (Signed)
Continue lansoprazole (Prevacid) '30mg'$  twice daily before breakfast and evening meal.  We will work towards a colonoscopy after the first of the year.  Call if you notice any black or bloody stools.

## 2022-02-06 ENCOUNTER — Telehealth: Payer: Self-pay | Admitting: Internal Medicine

## 2022-02-06 NOTE — Telephone Encounter (Signed)
Pt sent mychart message cannot afford MONJARO.

## 2022-02-07 ENCOUNTER — Encounter (HOSPITAL_COMMUNITY)
Admission: RE | Admit: 2022-02-07 | Discharge: 2022-02-07 | Disposition: A | Discharge status: Home / Self Care | Payer: Medicare HMO | Source: Ambulatory Visit | Attending: Internal Medicine | Admitting: Internal Medicine

## 2022-02-07 VITALS — BP 116/67 | HR 83 | Temp 98.2°F | Resp 16

## 2022-02-07 DIAGNOSIS — D509 Iron deficiency anemia, unspecified: Secondary | ICD-10-CM | POA: Diagnosis present

## 2022-02-07 MED ORDER — DIPHENHYDRAMINE HCL 25 MG PO CAPS: 25.0000 mg | ORAL_CAPSULE | Freq: Once | ORAL | Status: DC

## 2022-02-07 MED ORDER — SODIUM CHLORIDE 0.9 % IV SOLN
125.0000 mg | Freq: Once | INTRAVENOUS | Status: AC
  Administered 2022-02-07: 125 mg via INTRAVENOUS
  Filled 2022-02-07: qty 10

## 2022-02-07 MED ORDER — ACETAMINOPHEN 325 MG PO TABS: 650.0000 mg | ORAL_TABLET | Freq: Once | ORAL | Status: DC

## 2022-02-07 NOTE — Progress Notes (Signed)
Diagnosis: Iron Deficiency Anemia  Provider:  Marland Kitchen MD  Procedure: Infusion  IV Type: Peripheral, IV Location: R Antecubital  Ferrlecit (ferric gluconate), Dose: 125 mg  Infusion Start Time: 3291 pm  Infusion Stop Time: 1452 pm  Post Infusion IV Care: Observation period completed and Peripheral IV Discontinued  Discharge: Condition: Good, Destination: Home . AVS provided to patient.   Performed by:  Oren Beckmann, RN

## 2022-02-08 ENCOUNTER — Ambulatory Visit: Payer: Medicare HMO | Admitting: Internal Medicine

## 2022-02-10 NOTE — Assessment & Plan Note (Signed)
Given recent symptomatic hypotension and borderline hypotensive reading today, we will decrease lisinopril to 5 mg daily. -Follow-up in 4 weeks

## 2022-02-10 NOTE — Assessment & Plan Note (Signed)
A1c increased to 9.6 in October.  His most recent insulin regimen consists of Lantus 18 units nightly and NovoLog 14 units 3 times daily with meals.  He is also prescribed Jardiance 25 mg daily and metformin 1000 mg twice daily.  He has been checking his blood sugar several times daily.  On review today, ranges are from 100-300s. -Start Mounjaro 2.5 mg weekly injections today -No changes to current insulin regimen -Follow-up in 4 weeks

## 2022-02-11 NOTE — Telephone Encounter (Signed)
Wife is going to contact ins and give a call back.

## 2022-02-21 ENCOUNTER — Ambulatory Visit: Payer: Medicare HMO | Attending: Nurse Practitioner

## 2022-02-21 ENCOUNTER — Telehealth: Payer: Self-pay | Admitting: Nurse Practitioner

## 2022-02-21 ENCOUNTER — Encounter: Payer: Self-pay | Admitting: Nurse Practitioner

## 2022-02-21 ENCOUNTER — Other Ambulatory Visit: Payer: Self-pay | Admitting: Nurse Practitioner

## 2022-02-21 ENCOUNTER — Ambulatory Visit: Payer: Medicare HMO | Attending: Nurse Practitioner | Admitting: Nurse Practitioner

## 2022-02-21 VITALS — BP 124/84 | HR 84 | Ht 67.0 in | Wt 140.4 lb

## 2022-02-21 DIAGNOSIS — R011 Cardiac murmur, unspecified: Secondary | ICD-10-CM

## 2022-02-21 DIAGNOSIS — E781 Pure hyperglyceridemia: Secondary | ICD-10-CM

## 2022-02-21 DIAGNOSIS — R002 Palpitations: Secondary | ICD-10-CM

## 2022-02-21 DIAGNOSIS — E785 Hyperlipidemia, unspecified: Secondary | ICD-10-CM

## 2022-02-21 DIAGNOSIS — I739 Peripheral vascular disease, unspecified: Secondary | ICD-10-CM

## 2022-02-21 DIAGNOSIS — I2510 Atherosclerotic heart disease of native coronary artery without angina pectoris: Secondary | ICD-10-CM | POA: Diagnosis not present

## 2022-02-21 DIAGNOSIS — R42 Dizziness and giddiness: Secondary | ICD-10-CM

## 2022-02-21 DIAGNOSIS — R7989 Other specified abnormal findings of blood chemistry: Secondary | ICD-10-CM

## 2022-02-21 NOTE — Telephone Encounter (Signed)
PERCERT:  7 DAY ZIO XT

## 2022-02-21 NOTE — Patient Instructions (Addendum)
Medication Instructions:  Your physician has recommended you make the following change in your medication:  Stop lisinopril Continue other medications the same  Labwork: BMET today at [facility] (your PCP office) or [place]. [place] Non-fasting  Testing/Procedures: Your physician has requested that you have an echocardiogram. Echocardiography is a painless test that uses sound waves to create images of your heart. It provides your doctor with information about the size and shape of your heart and how well your heart's chambers and valves are working. This procedure takes approximately one hour. There are no restrictions for this procedure. Please do NOT wear cologne, perfume, aftershave, or lotions (deodorant is allowed). Please arrive 15 minutes prior to your appointment time. Your physician has recommended that you wear a Zio monitor.   This monitor is a medical device that records the heart's electrical activity. Doctors most often use these monitors to diagnose arrhythmias. Arrhythmias are problems with the speed or rhythm of the heartbeat. The monitor is a small device applied to your chest. You can wear one while you do your normal daily activities. While wearing this monitor if you have any symptoms to push the button and record what you felt. Once you have worn this monitor for the period of time provider prescribed (for 14 days), you will return the monitor device in the postage paid box. Once it is returned they will download the data collected and provide [name] with a report which the provider will then review and we will call you with those results. Important tips:  Avoid showering during the first 24 hours of wearing the monitor. Avoid excessive sweating to help maximize wear time. Do not submerge the device, no hot tubs, and no swimming pools. Keep any lotions or oils away from the patch. After 24 hours you may shower with the patch on. Take brief showers with your back facing  the shower head.  Do not remove patch once it has been placed because that will interrupt data and decrease adhesive wear time. Push the button when you have any symptoms and write down what you were feeling. Once you have completed wearing your monitor, remove and place into box which has postage paid and place in your outgoing mailbox.  If for some reason you have misplaced your box then call our office and we can provide another box and/or mail it off for you.  Follow-Up: Your physician recommends that you schedule a follow-up appointment in: 2-3 months  Any Other Special Instructions Will Be Listed Below (If Applicable).  If you need a refill on your cardiac medications before your next appointment, please call your pharmacy.

## 2022-02-21 NOTE — Progress Notes (Signed)
Cardiology Office Note:    Date:  02/21/2022   ID:  Jimmy Duran, DOB [birth year], MRN [id number]  PCP:  [name], MD   [facility] Cardiologist:  [name], MD     Referring MD: Laural Benes, MD   CC: Here for 3 month follow-up  History of Present Illness:    Jimmy Duran is a 66 y.o. male with a hx of the following:  CAD Carotid stenosis Hypertension Type 2 diabetes History of TIA COPD GERD Tobacco abuse Palpitations, dizziness IDA  Patient is a delightful 66 year old male with past medical history as mentioned above.  Previous cardiovascular history includes nonobstructive CAD and low risk stress test in 2015  In 2021 he underwent right CEA, and underwent previous stenting to superior mesenteric artery in 2022.  More recently, was admitted to [facility] in August 2023 for arranged left TCAR, no immediate complications.  Presented back to [facility] ED on October 27, 2021 for worsening dizziness x 3 weeks.  Received IV fluids due to poor p.o. intake.  Lopressor was reduced and was recommended to follow-up with cardiology outpatient.  Last seen by [name], PA-C on November 22, 2021 for hospital follow-up.  At office visit that day he noted worsening lightheadedness x 1 month, not triggered with certain movements.  Said sometimes his legs feel weak and experienced occasional falls.  Denied any chest pain or palpitations, denied any shortness of breath.  Noted 40 pound weight loss over the past year, this was unintentional.  Was due for repeat colonoscopy in 2024.  Was going to follow-up with his PCP regarding this.  Orthostatics checked in office and found to have SBP dropped by 10 points.  It was recommended to stop hydrochlorothiazide and was given a prescription for lisinopril 20 mg.  Was told to monitor and log his BP.  Was told to follow-up in 3 months.  Today he presents for 79-monthfollow-up.  He states he continues to be  lightheaded every once in a while, says the room feels like it is floating - not spinning.  Denies any vertigo symptoms.  He has been off lisinopril for a week.  He says he stopped taking lisinopril as his blood pressure was low, around 80/42.  Said he was stumbling and falling.  His symptoms have improved significantly after stopping lisinopril.  Blood pressure averaging around 117/68 at home.  Says his symptoms of lightheadedness started ever since he had surgery earlier this year for carotid artery.  Does admit to occasional palpitations, nonsustained, not bothersome per his report.  Denies any chest pain currently, says his chest feels a little tight as he has not chest cold he says right now.  Other than this he denies any other associated symptoms, or any acute cardiac complaints or issues.  Denies any acute bleeding or claudication.  Denies any other questions or concerns today.  Past Medical History:  Diagnosis Date   AAA (abdominal aortic aneurysm) (HCC)    Arthritis    Carotid artery disease (HCC)    Colon polyp    COPD (chronic obstructive pulmonary disease) (HCC)    Coronary artery disease    Nonobstructive   Essential hypertension    GERD (gastroesophageal reflux disease)    Hemorrhoids    History of TIA (transient ischemic attack)    Low back pain    Lumbar radiculopathy    Mixed hyperlipidemia    Palpitations    Peripheral vascular disease ([place]  Shingles 09/23/2019   Type 2 diabetes mellitus [facility])     Past Surgical History:  Procedure Laterality Date   APPENDECTOMY  1970   BIOPSY  12/08/2017   Procedure: BIOPSY;  Surgeon: [name], MD;  [facility];  Service: Endoscopy;;  esophagus    BIOPSY  12/28/2020   Procedure: BIOPSY;  Surgeon: [name], MD;  [facility];  Service: Endoscopy;;   BIOPSY  07/12/2021   Procedure: BIOPSY;  Surgeon: [name], MD;  [facility];  Service: Endoscopy;;   BREAST SURGERY Right     Cyst resection on the right   CARDIAC CATHETERIZATION  1990's   in [place]. no stent placement   CATARACT EXTRACTION W/PHACO Right 06/18/2016   Procedure: CATARACT EXTRACTION PHACO AND INTRAOCULAR LENS PLACEMENT ([place]);  Surgeon: [name], MD;  Location: [facility];  Service: Ophthalmology;  Laterality: Right;  [name] 35.7 AP% 16.6 CDE 5.94 Fluid pack lot # [id number]   CATARACT EXTRACTION W/PHACO Left 07/16/2016   Procedure: CATARACT EXTRACTION PHACO AND INTRAOCULAR LENS PLACEMENT (IOC);  Surgeon: [name], MD;  Location: [facility];  Service: Ophthalmology;  Laterality: Left;  [name] 51.8 AP% 12.7 CDE 6.58 Fluid Pack Lot # [id number]   CHOLECYSTECTOMY  1993   COLONOSCOPY  02/12/2007   DUK:[id number] friable anal canal hemorrhoids, otherwise normal rectum and colon   COLONOSCOPY N/A 07/16/2012   RMR: Colonic diverticulosis. 4 mm tubular adenoma   COLONOSCOPY WITH PROPOFOL N/A 12/08/2017   Dr. Gala Romney: sigmoid and descending colon diverticulosis, transverse colon polyp (TUBULAR ADENOMA)   ENDARTERECTOMY Right 10/08/2019   Procedure: RIGHT CAROTID ENDARTERECTOMY;  Surgeon: Rosetta Posner, MD;  [facility];  Service: Vascular;  Laterality: Right;   ESOPHAGOGASTRODUODENOSCOPY (EGD) WITH ESOPHAGEAL DILATION N/A 07/16/2012   RMR: +Candida esophagitis, Erosive reflux esophagiits. Schatizi's ring status post dilation. Hiatal hernia. Antral erosions status post biopsy.    ESOPHAGOGASTRODUODENOSCOPY (EGD) WITH PROPOFOL N/A 12/08/2017   Dr. Gala Romney: esophagitis, query EOE but negative for increased eosinophils on path, small hiatal hernia, normal stomach, normal duodenum, PAS stain with rare yeast forms on stain. Empiric dilation   ESOPHAGOGASTRODUODENOSCOPY (EGD) WITH PROPOFOL N/A 12/28/2020   esophagitis without bleeding, s/p biopsy and dilation. Antral erosions s/p biopsy.   ESOPHAGOGASTRODUODENOSCOPY (EGD) WITH PROPOFOL N/A 07/12/2021   Surgeon: [name], MD;  Cobblestoning appearance  of distal esophagus, slight narrowing of distal esophagus s/p dilation with 20 Fr and biopsy, gastric erosions biopsied, normal examined duodenum.  Esophageal biopsy with reactive squamous mucosa, gastric biopsy with reactive gastropathy, negative for H. pylori.   EYE SURGERY Bilateral 2018   cataract   HERNIA REPAIR     JOINT REPLACEMENT     KNEE SURGERY     Left knee arthroscopy torn medial meniscus grade 4 chondral changes medial femoral condyle tibial plateau   MALONEY DILATION N/A 12/08/2017   Procedure: MALONEY DILATION;  Surgeon: [name], MD;  [facility];  Service: Endoscopy;  Laterality: N/A;   MALONEY DILATION N/A 12/28/2020   Procedure: Venia Minks DILATION;  Surgeon: [name], MD;  [facility];  Service: Endoscopy;  Laterality: N/A;   MALONEY DILATION N/A 07/12/2021   Procedure: Venia Minks DILATION;  Surgeon: [name], MD;  [facility];  Service: Endoscopy;  Laterality: N/A;   PATCH ANGIOPLASTY Right 10/08/2019   Procedure: PATCH ANGIOPLASTY of the right common carotid artery using hemashield plaltinum finesse patch;  Surgeon: Rosetta Posner,  MD;  Location: [place];  Service: Vascular;  Laterality: Right;   PERIPHERAL VASCULAR INTERVENTION  07/17/2020   Procedure: PERIPHERAL VASCULAR INTERVENTION;  Surgeon: Waynetta Sandy, MD;  Location: [place] [facility];  Service: Cardiovascular;;   POLYPECTOMY  12/08/2017   Procedure: POLYPECTOMY;  Surgeon: [name], MD;  [facility];  Service: Endoscopy;;  colon   TOTAL KNEE ARTHROPLASTY Left 04/27/2012   Procedure: TOTAL KNEE ARTHROPLASTY;  Surgeon: Carole Civil, MD;  Location[facility];  Service: Orthopedics;  Laterality: Left;   TRANSCAROTID ARTERY REVASCULARIZATION  Left 10/12/2021   Procedure: Left Transcarotid Artery Revascularization;  Surgeon: Waynetta Sandy, MD;  Location: [place];  Service: Vascular;  Laterality: Left;   [place] N/A 07/17/2020   Procedure: mesenteric ANGIOGRAPHY;  Surgeon: Waynetta Sandy, MD;  Location: [place] [facility];  Service: Cardiovascular;  Laterality: N/A;    Current Medications: Current Meds  Medication Sig   albuterol (VENTOLIN HFA) 108 (90 Base) MCG/ACT inhaler Inhale 2 puffs into the lungs every 6 (six) hours as needed for wheezing or shortness of breath.   aspirin EC 81 MG tablet Take 1 tablet (81 mg total) by mouth daily at 6 (six) AM. Swallow whole.   clopidogrel (PLAVIX) 75 MG tablet Take 1 tablet (75 mg total) by mouth daily.   Continuous Blood Gluc Sensor (FREESTYLE LIBRE 3 SENSOR) MISC 1 each by Does not apply route every 14 (fourteen) days. Place 1 sensor on the skin every 14 days. Use to check glucose continuously   diphenhydrAMINE (BENADRYL) 25 MG tablet Take 25 mg by mouth daily as needed for allergies.    empagliflozin (JARDIANCE) 25 MG TABS tablet Take 1 tablet (25 mg total) by mouth daily before breakfast.   famotidine (PEPCID) 20 MG tablet Take 1 tablet (20 mg total) by mouth 2 (two) times daily.   Glucagon (GVOKE HYPOPEN 2-PACK) 1 MG/0.2ML SOAJ Inject 1 mg into the skin as needed (hypoglycemia).   insulin glargine (LANTUS) 100 UNIT/ML injection Inject 0.18 mLs (18 Units total) into the skin daily.   lansoprazole (PREVACID) 30 MG capsule Take 1 capsule (30 mg total) by mouth 2 (two) times daily before a meal.   metFORMIN (GLUCOPHAGE) 1000 MG tablet Take 1 tablet (1,000 mg total) by mouth 2 (two) times daily with a meal.   metoprolol tartrate (LOPRESSOR) 25 MG tablet Take 0.5 tablets (12.5 mg total) by mouth 2 (two) times daily.   NOVOLOG 100 UNIT/ML injection Inject 14 Units into the skin 3 (three) times daily with meals.   rosuvastatin (CRESTOR) 40 MG tablet Take 1 tablet (40 mg total) by mouth daily.   [DISCONTINUED] lisinopril (ZESTRIL) 5 MG tablet Take 1 tablet (5 mg total) by mouth daily.     Allergies:   Patient has no  known allergies.   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    Social Determinants of Radio broadcast assistant Strain: Not on file  Food Insecurity: Not on file  Transportation Needs: Not on file  Physical Activity: Not on file  Stress: Not on file  Social Connections: Not on file     Family History: The patient's family history includes Cancer in his father and paternal grandmother; Cancer - Lung in his father and maternal grandfather; Colon cancer in some other family members; Colon polyps in his mother; Coronary artery disease in his father; Dementia in his mother; Emphysema in his maternal grandmother; Heart attack in his father and paternal grandfather; Heart disease in his brother; Hypertension in his father and mother; Osteoporosis in his mother.  ROS:   Review of Systems  Constitutional: Negative.   HENT: Negative.    Eyes: Negative.   Respiratory: Negative.    Cardiovascular:  Positive for chest pain and palpitations. Negative for orthopnea, claudication, leg swelling and PND.       See HPI.   Gastrointestinal: Negative.   Genitourinary: Negative.   Musculoskeletal:  Negative.   Skin: Negative.   Neurological:  Positive for weakness. Negative for dizziness, tingling, tremors, sensory change, speech change, focal weakness, seizures, loss of consciousness and headaches.       Lightheadedness. See HPI.   Endo/Heme/Allergies: Negative.   Psychiatric/Behavioral: Negative.      Please see the history of present illness.    All other systems reviewed and are negative.  EKGs/Labs/Other Studies Reviewed:    The following studies were reviewed today:   EKG:  EKG is not ordered today.    Bilateral carotid duplex on 11/14/2021: Summary:  Right Carotid: Velocities in the right ICA are consistent with a 1-39%  stenosis.   Left Carotid: Patent left ICA stent with no evidence of restenosis.   Vertebrals:  Bilateral vertebral arteries demonstrate antegrade flow.  Subclavians: Normal flow hemodynamics were seen in bilateral subclavian               arteries.   Mesenteric limited on 08/21/2020: Summary:  Mesenteric:  70 to 99% stenosis in the superior mesenteric artery.  Unable to determine distal end of stent. Stenosis is either at the distal  end of  or distal to the stent.     Vascular US aorta/ IVC/ iliacs doppler on 09/28/2019: Summary:  Abdominal Aorta: The largest aortic measurement is 2.4 cm. Unable to  identify dissection of infrarenal aorta due to bowel gas.  The left common iliac artery measures approximately 2.25 cm x 2.33 cm,  based on limited visualization due to overlying bowel gas.  2D echocardiogram on 12/11/2018: 1. Left ventricular ejection fraction, by visual estimation, is 60 to  65%. The left ventricle has normal function. There is moderately increased  left ventricular hypertrophy.   2. Left ventricular diastolic Doppler parameters are consistent with  impaired relaxation pattern of LV diastolic filling.   3. Global right ventricle has low normal systolic function.The right  ventricular size is normal. No increase in right  ventricular wall  thickness.  4. Left atrial size was normal.   5. Right atrial size was normal.   6. Mild mitral annular calcification.   7. Mild to moderate aortic valve annular calcification.   8. The mitral valve is degenerative. No evidence of mitral valve  regurgitation.   9. The tricuspid valve is grossly normal. Tricuspid valve regurgitation  was not visualized by color flow Doppler.  10. The aortic valve is tricuspid Aortic valve regurgitation was not  visualized by color flow Doppler. Mild aortic valve sclerosis without  stenosis.  11. The pulmonic valve was not well visualized. Pulmonic valve  regurgitation is not visualized by color flow Doppler.  12. The inferior vena cava is normal in size with greater than 50%  respiratory variability, suggesting right atrial pressure of 3 mmHg.   Myoview on 04/23/2013: IMPRESSION:  1. Negative Lexi scan myocardial perfusion imaging stress test   2. Normal left ventricular ejection fraction with normal wall  motion.   3. Low risk study for major cardiovascular events.   Recent Labs: 10/04/2021: ALT 13 01/29/2022: BUN 22; Creatinine, Ser 1.31; Hemoglobin 10.1; Platelets 498; Potassium 4.9; Sodium 135  Recent Lipid Panel    Component Value Date/Time   CHOL 82 10/13/2021 0513   CHOL 96 (L) 09/10/2021 0923   TRIG 318 (H) 10/13/2021 0513   HDL 30 (L) 10/13/2021 0513   HDL 34 (L) 09/10/2021 0923   CHOLHDL 2.7 10/13/2021 0513   VLDL 64 (H) 10/13/2021 0513   LDLCALC NOT CALCULATED 10/13/2021 0513   LDLCALC 23 09/10/2021 0923    Physical Exam:    VS:  BP 124/84   Pulse 84   Ht _0  (1.702 m)   Wt 140 lb 6.4 oz (63.7 kg)   SpO2 98%   BMI 21.99 kg/m     Wt Readings from Last 3 Encounters:  02/21/22 140 lb 6.4 oz (63.7 kg)  02/05/22 138 lb 3.2 oz (62.7 kg)  02/05/22 138 lb 9.6 oz (62.9 kg)     GEN: Thin, 66 y.o. male in no acute distress HEENT: Normal NECK: No JVD; No R carotid bruit, L carotid bruit noted  CARDIAC:  S1/S2, RRR, Grade 2/6 murmur noted on exam, rubs, gallops; 2+ peripheral pulses throughout, strong equal bilaterally RESPIRATORY:  Clear and diminished to auscultation without rales, wheezing or rhonchi  MUSCULOSKELETAL:  No edema; No deformity  SKIN: Warm and dry NEUROLOGIC:  Alert and oriented x 3 PSYCHIATRIC:  Normal affect   ASSESSMENT:    1. Intermittent lightheadedness   2. Palpitations   3. Coronary artery disease involving native heart without angina pectoris, unspecified vessel or lesion type   4. PAD (peripheral artery disease) ([place])   5. Hyperlipidemia, unspecified hyperlipidemia type   6. Hypertriglyceridemia   7. Murmur   8. Elevated serum creatinine    PLAN:    In order of problems listed above:  Lightheadedness, Palpitations Some improvement in lightheadedness since self-discontinuing Lisinopril. Will d/c this medication today. Continue low dose Lopressor. SBP averaging 117 at home. Orthostatics performed last visit revealed SBP drop by 10 points. Discussed to log and monitor BP and to notify office if SBP < 100 and if he continues to have symptoms. Heart healthy diet and regular cardiovascular exercise encouraged. Continue to follow-up with [facility]. He also admits to palpitations at times, will arrange 14 day Zio monitor. Will obtain BMET. Heart healthy diet and regular cardiovascular exercise encouraged.   CAD Stable with no anginal symptoms, only admits to "chest cold" symptoms. No  indication for ischemic evaluation.  Continue aspirin, Plavix, Lopressor, and rosuvastatin. Heart healthy diet and regular cardiovascular exercise encouraged.Will continue to monitor symptoms. ED precautions discussed. Continue to follow with PCP.    PAD He is s/p right CEA in 2021, stenting to SMA in 2022,as well as TCAR (L) in 10/2021. Continue current medication regimen. Follow-up with [facility]. Heart healthy diet and regular cardiovascular exercise encouraged.   HLD, hypertriglyceridemia Labs  from August 2023 revealed total cholesterol 82, HDL 30, LDL 23, and triglycerides 318.  Continue current medication regimen. Heart healthy diet and regular cardiovascular exercise encouraged. If TG elevated at next FLP check, plan to discuss fenofibrate.   Murmur Grade 2/6 systolic murmur noted on exam.  Last echocardiogram in 2020 revealed normal EF, no significant valvular abnormalities. Will update 2D echo at this time. Heart healthy diet and regular cardiovascular exercise encouraged.   Elevated serum creatinine Most recent sCr was 1.31 and eGFR >60, will update BMET as mentioned above. Heart healthy diet and regular cardiovascular exercise encouraged.   Disposition: Follow-up with me in 2 months or sooner if anything changes.    Medication Adjustments/Labs and Tests Ordered: Current medicines are reviewed at length with the patient today.  Concerns regarding medicines are outlined above.  Orders Placed This Encounter  Procedures   Basic metabolic panel   ECHOCARDIOGRAM COMPLETE   No orders of the defined types were placed in this encounter.   Patient Instructions  Medication Instructions:  Your physician has recommended you make the following change in your medication:  Stop lisinopril Continue other medications the same  Labwork: BMET today at [facility] (your PCP office) or [place]. [place] Non-fasting  Testing/Procedures: Your physician has requested that you have an echocardiogram. Echocardiography is a painless test that uses sound waves to create images of your heart. It provides your doctor with information about the size and shape of your heart and how well your heart's chambers and valves are working. This procedure takes approximately one hour. There are no restrictions for this procedure. Please do NOT wear cologne, perfume, aftershave, or lotions (deodorant is allowed). Please arrive 15 minutes prior to your appointment time. Your physician has recommended  that you wear a Zio monitor.   This monitor is a medical device that records the heart's electrical activity. Doctors most often use these monitors to diagnose arrhythmias. Arrhythmias are problems with the speed or rhythm of the heartbeat. The monitor is a small device applied to your chest. You can wear one while you do your normal daily activities. While wearing this monitor if you have any symptoms to push the button and record what you felt. Once you have worn this monitor for the period of time provider prescribed (for 14 days), you will return the monitor device in the postage paid box. Once it is returned they will download the data collected and provide [name] with a report which the provider will then review and we will call you with those results. Important tips:  Avoid showering during the first 24 hours of wearing the monitor. Avoid excessive sweating to help maximize wear time. Do not submerge the device, no hot tubs, and no swimming pools. Keep any lotions or oils away from the patch. After 24 hours you may shower with the patch on. Take brief showers with your back facing the shower head.  Do not remove patch once it has been placed because that will interrupt data and decrease adhesive wear time. Push the button when  you have any symptoms and write down what you were feeling. Once you have completed wearing your monitor, remove and place into box which has postage paid and place in your outgoing mailbox.  If for some reason you have misplaced your box then call our office and we can provide another box and/or mail it off for you.  Follow-Up: Your physician recommends that you schedule a follow-up appointment in: 2-3 months  Any Other Special Instructions Will Be Listed Below (If Applicable).  If you need a refill on your cardiac medications before your next appointment, please call your pharmacy.   Signed, Finis Bud, NP  02/21/2022 [place]

## 2022-02-23 ENCOUNTER — Encounter (HOSPITAL_COMMUNITY): Payer: Self-pay | Admitting: Emergency Medicine

## 2022-02-23 ENCOUNTER — Inpatient Hospital Stay (HOSPITAL_COMMUNITY)
Admission: EM | Admit: 2022-02-23 | Discharge: 2022-02-25 | DRG: 281 | Discharge status: Against Medical Advice | Payer: Medicare HMO | Attending: Family Medicine | Admitting: Family Medicine

## 2022-02-23 ENCOUNTER — Other Ambulatory Visit: Payer: Self-pay

## 2022-02-23 ENCOUNTER — Emergency Department (HOSPITAL_COMMUNITY): Payer: Medicare HMO

## 2022-02-23 DIAGNOSIS — Z1152 Encounter for screening for COVID-19: Secondary | ICD-10-CM

## 2022-02-23 DIAGNOSIS — F17210 Nicotine dependence, cigarettes, uncomplicated: Secondary | ICD-10-CM | POA: Diagnosis present

## 2022-02-23 DIAGNOSIS — Z794 Long term (current) use of insulin: Secondary | ICD-10-CM

## 2022-02-23 DIAGNOSIS — Z7984 Long term (current) use of oral hypoglycemic drugs: Secondary | ICD-10-CM

## 2022-02-23 DIAGNOSIS — I739 Peripheral vascular disease, unspecified: Secondary | ICD-10-CM

## 2022-02-23 DIAGNOSIS — M5416 Radiculopathy, lumbar region: Secondary | ICD-10-CM | POA: Diagnosis present

## 2022-02-23 DIAGNOSIS — I25110 Atherosclerotic heart disease of native coronary artery with unstable angina pectoris: Secondary | ICD-10-CM | POA: Diagnosis present

## 2022-02-23 DIAGNOSIS — J449 Chronic obstructive pulmonary disease, unspecified: Secondary | ICD-10-CM | POA: Diagnosis present

## 2022-02-23 DIAGNOSIS — D509 Iron deficiency anemia, unspecified: Secondary | ICD-10-CM | POA: Diagnosis present

## 2022-02-23 DIAGNOSIS — K922 Gastrointestinal hemorrhage, unspecified: Secondary | ICD-10-CM | POA: Diagnosis present

## 2022-02-23 DIAGNOSIS — Z8262 Family history of osteoporosis: Secondary | ICD-10-CM

## 2022-02-23 DIAGNOSIS — Z9862 Peripheral vascular angioplasty status: Secondary | ICD-10-CM

## 2022-02-23 DIAGNOSIS — R079 Chest pain, unspecified: Secondary | ICD-10-CM | POA: Diagnosis not present

## 2022-02-23 DIAGNOSIS — Z7902 Long term (current) use of antithrombotics/antiplatelets: Secondary | ICD-10-CM

## 2022-02-23 DIAGNOSIS — Z79899 Other long term (current) drug therapy: Secondary | ICD-10-CM

## 2022-02-23 DIAGNOSIS — K219 Gastro-esophageal reflux disease without esophagitis: Secondary | ICD-10-CM | POA: Diagnosis present

## 2022-02-23 DIAGNOSIS — E782 Mixed hyperlipidemia: Secondary | ICD-10-CM | POA: Diagnosis present

## 2022-02-23 DIAGNOSIS — Z96652 Presence of left artificial knee joint: Secondary | ICD-10-CM | POA: Diagnosis present

## 2022-02-23 DIAGNOSIS — Z801 Family history of malignant neoplasm of trachea, bronchus and lung: Secondary | ICD-10-CM

## 2022-02-23 DIAGNOSIS — Z7982 Long term (current) use of aspirin: Secondary | ICD-10-CM

## 2022-02-23 DIAGNOSIS — I10 Essential (primary) hypertension: Secondary | ICD-10-CM | POA: Diagnosis present

## 2022-02-23 DIAGNOSIS — I214 Non-ST elevation (NSTEMI) myocardial infarction: Principal | ICD-10-CM | POA: Diagnosis present

## 2022-02-23 DIAGNOSIS — Z8249 Family history of ischemic heart disease and other diseases of the circulatory system: Secondary | ICD-10-CM

## 2022-02-23 DIAGNOSIS — Z800 Family history of malignant neoplasm of digestive organs: Secondary | ICD-10-CM

## 2022-02-23 DIAGNOSIS — M1990 Unspecified osteoarthritis, unspecified site: Secondary | ICD-10-CM | POA: Diagnosis present

## 2022-02-23 DIAGNOSIS — Z825 Family history of asthma and other chronic lower respiratory diseases: Secondary | ICD-10-CM

## 2022-02-23 DIAGNOSIS — E1151 Type 2 diabetes mellitus with diabetic peripheral angiopathy without gangrene: Secondary | ICD-10-CM | POA: Diagnosis present

## 2022-02-23 DIAGNOSIS — Z8673 Personal history of transient ischemic attack (TIA), and cerebral infarction without residual deficits: Secondary | ICD-10-CM

## 2022-02-23 DIAGNOSIS — D62 Acute posthemorrhagic anemia: Secondary | ICD-10-CM | POA: Diagnosis present

## 2022-02-23 DIAGNOSIS — I7102 Dissection of abdominal aorta: Secondary | ICD-10-CM | POA: Diagnosis present

## 2022-02-23 HISTORY — DX: Nicotine dependence, unspecified, uncomplicated: F17.200

## 2022-02-23 LAB — BASIC METABOLIC PANEL
Anion gap: 11 (ref 5–15)
BUN: 13 mg/dL (ref 8–23)
CO2: 23 mmol/L (ref 22–32)
Calcium: 8.9 mg/dL (ref 8.9–10.3)
Chloride: 104 mmol/L (ref 98–111)
Creatinine, Ser: 1.2 mg/dL (ref 0.61–1.24)
GFR, Estimated: >60 mL/min (ref >60)
Glucose, Bld: 327 mg/dL — ABNORMAL HIGH (ref 70–99)
Potassium: 3.7 mmol/L (ref 3.5–5.1)
Sodium: 138 mmol/L (ref 135–145)

## 2022-02-23 LAB — CBC
HCT: 27.3 % — ABNORMAL LOW (ref 39.0–52.0)
Hemoglobin: 8.4 g/dL — ABNORMAL LOW (ref 13.0–17.0)
MCH: 26.6 pg (ref 26.0–34.0)
MCHC: 30.8 g/dL (ref 30.0–36.0)
MCV: 86.4 fL (ref 80.0–100.0)
Platelets: 409 K/uL — ABNORMAL HIGH (ref 150–400)
RBC: 3.16 MIL/uL — ABNORMAL LOW (ref 4.22–5.81)
RDW: 17.4 % — ABNORMAL HIGH (ref 11.5–15.5)
WBC: 7.2 K/uL (ref 4.0–10.5)
nRBC: 0 % (ref 0.0–0.2)

## 2022-02-23 LAB — TROPONIN I (HIGH SENSITIVITY): Troponin I (High Sensitivity): 28 ng/L — ABNORMAL HIGH (ref <18)

## 2022-02-23 MED ORDER — SODIUM CHLORIDE 0.9 % IV BOLUS
500.0000 mL | Freq: Once | INTRAVENOUS | Status: AC
  Administered 2022-02-23: 500 mL via INTRAVENOUS

## 2022-02-23 MED ORDER — METOPROLOL TARTRATE 5 MG/5ML IV SOLN
5.0000 mg | Freq: Once | INTRAVENOUS | Status: AC
  Administered 2022-02-23: 5 mg via INTRAVENOUS
  Filled 2022-02-23: qty 5

## 2022-02-23 NOTE — ED Triage Notes (Signed)
Pt via pv, c/o mid sternal cp that has been intermittent x 5 hours. States at one time pain did radiate into left armpit. States these episodes lasts approx 20 min. Had n/v x 2 with last episode. Denies any sweating or dizziness. Color wnl in triage. Pt currently wearing a heart monitor to left upper chest. Non diaphoretic in triage.

## 2022-02-23 NOTE — ED Provider Notes (Signed)
[facility] Emergency Department Provider Note MRN:  [id number]  Arrival date & time: 02/24/22     Chief Complaint   Chest Pain   History of Present Illness   Jimmy Duran is a 66 y.o. year-old male with a history of abdominal aortic dissection, diabetes, peripheral vascular disease presenting to the ED with chief complaint of chest pain.  Severe chest pain center of the chest, occurred while he was mopping the floor earlier today.  He rested and it went away.  Then this evening while sitting on the couch, not exerting himself, the pain came back, again severe, lasting several minutes and then going away.  Received aspirin with EMS.  Currently pain-free.  Had associated nausea and vomiting and lightheadedness.  Review of Systems  A thorough review of systems was obtained and all systems are negative except as noted in the HPI and PMH.   Patient's Health History    Past Medical History:  Diagnosis Date   AAA (abdominal aortic aneurysm) (HCC)    Arthritis    Carotid artery disease (HCC)    Colon polyp    COPD (chronic obstructive pulmonary disease) (HCC)    Coronary artery disease    Nonobstructive   Essential hypertension    GERD (gastroesophageal reflux disease)    Hemorrhoids    History of TIA (transient ischemic attack)    Low back pain    Lumbar radiculopathy    Mixed hyperlipidemia    Palpitations    Peripheral vascular disease ([place])    Shingles 09/23/2019   Type 2 diabetes mellitus ([place])     Past Surgical History:  Procedure Laterality Date   APPENDECTOMY  1970   BIOPSY  12/08/2017   Procedure: BIOPSY;  Surgeon: [name], MD;  [facility];  Service: Endoscopy;;  esophagus    BIOPSY  12/28/2020   Procedure: BIOPSY;  Surgeon: [name], MD;  [facility];  Service: Endoscopy;;   BIOPSY  07/12/2021   Procedure: BIOPSY;  Surgeon: [name], MD;  [facility];  Service: Endoscopy;;    BREAST SURGERY Right    Cyst resection on the right   CARDIAC CATHETERIZATION  1990's   in [place]. no stent placement   CATARACT EXTRACTION W/PHACO Right 06/18/2016   Procedure: CATARACT EXTRACTION PHACO AND INTRAOCULAR LENS PLACEMENT ([place]);  Surgeon: [name], MD;  Location: [facility] OR[facility];  Service: Ophthalmology;  Laterality: Right;  [name] 35.7 AP% 16.6 CDE 5.94 Fluid pack lot # [id number]   CATARACT EXTRACTION W/PHACO Left 07/16/2016   Procedure: CATARACT EXTRACTION PHACO AND INTRAOCULAR LENS PLACEMENT (IOC);  Surgeon: [name], MD;  Location: [facility];  Service: Ophthalmology;  Laterality: Left;  [name] 51.8 AP% 12.7 CDE 6.58 Fluid Pack Lot # [id number]   CHOLECYSTECTOMY  1993   COLONOSCOPY  02/12/2007   EQA:[id number] friable anal canal hemorrhoids, otherwise normal rectum and colon   COLONOSCOPY N/A 07/16/2012   RMR: Colonic diverticulosis. 4 mm tubular adenoma   COLONOSCOPY WITH PROPOFOL N/A 12/08/2017   Dr. Gala Romney: sigmoid and descending colon diverticulosis, transverse colon polyp (TUBULAR ADENOMA)   ENDARTERECTOMY Right 10/08/2019   Procedure: RIGHT CAROTID ENDARTERECTOMY;  Surgeon: Rosetta Posner, MD;  Location: [facility];  Service: Vascular;  Laterality: Right;   ESOPHAGOGASTRODUODENOSCOPY (EGD) WITH ESOPHAGEAL DILATION N/A 07/16/2012   RMR: +Candida esophagitis, Erosive reflux esophagiits. Schatizi's ring status post dilation. Hiatal hernia. Antral erosions status post biopsy.    ESOPHAGOGASTRODUODENOSCOPY (EGD) WITH PROPOFOL  N/A 12/08/2017   Dr. Gala Romney: esophagitis, query EOE but negative for increased eosinophils on path, small hiatal hernia, normal stomach, normal duodenum, PAS stain with rare yeast forms on stain. Empiric dilation   ESOPHAGOGASTRODUODENOSCOPY (EGD) WITH PROPOFOL N/A 12/28/2020   esophagitis without bleeding, s/p biopsy and dilation. Antral erosions s/p biopsy.   ESOPHAGOGASTRODUODENOSCOPY (EGD) WITH PROPOFOL N/A 07/12/2021   Surgeon: [name], MD;   Cobblestoning appearance of distal esophagus, slight narrowing of distal esophagus s/p dilation with 42 Fr and biopsy, gastric erosions biopsied, normal examined duodenum.  Esophageal biopsy with reactive squamous mucosa, gastric biopsy with reactive gastropathy, negative for H. pylori.   EYE SURGERY Bilateral 2018   cataract   HERNIA REPAIR     JOINT REPLACEMENT     KNEE SURGERY     Left knee arthroscopy torn medial meniscus grade 4 chondral changes medial femoral condyle tibial plateau   MALONEY DILATION N/A 12/08/2017   Procedure: MALONEY DILATION;  Surgeon: [name], MD;  [facility];  Service: Endoscopy;  Laterality: N/A;   MALONEY DILATION N/A 12/28/2020   Procedure: Venia Minks DILATION;  Surgeon: [name], MD;  [facility];  Service: Endoscopy;  Laterality: N/A;   MALONEY DILATION N/A 07/12/2021   Procedure: Venia Minks DILATION;  Surgeon: [name], MD;  [facility];  Service: Endoscopy;  Laterality: N/A;   PATCH ANGIOPLASTY Right 10/08/2019   Procedure: PATCH ANGIOPLASTY of the right common carotid artery using hemashield plaltinum finesse patch;  Surgeon: Rosetta Posner, MD;  Location: [place] [facility];  Service: Vascular;  Laterality: Right;   PERIPHERAL VASCULAR INTERVENTION  07/17/2020   Procedure: PERIPHERAL VASCULAR INTERVENTION;  Surgeon: Waynetta Sandy, MD;  Location: [place] [facility];  Service: Cardiovascular;;   POLYPECTOMY  12/08/2017   Procedure: POLYPECTOMY;  Surgeon: [name], MD;  [facility];  Service: Endoscopy;;  colon   TOTAL KNEE ARTHROPLASTY Left 04/27/2012   Procedure: TOTAL KNEE ARTHROPLASTY;  Surgeon: Carole Civil, MD;  Location: [facility];  Service: Orthopedics;  Laterality: Left;   TRANSCAROTID ARTERY REVASCULARIZATION  Left 10/12/2021   Procedure: Left Transcarotid Artery Revascularization;  Surgeon: Waynetta Sandy, MD;  Location: [place];  Service: Vascular;  Laterality: Left;    [place] N/A 07/17/2020   Procedure: mesenteric ANGIOGRAPHY;  Surgeon: Waynetta Sandy, MD;  Location: [place] [facility];  Service: Cardiovascular;  Laterality: N/A;    Family History  Problem Relation Age of Onset   Dementia Mother    Hypertension Mother    Colon polyps Mother    Osteoporosis Mother    Cancer Father    Hypertension Father    Coronary artery disease Father        CABG in his 65's   Heart attack Father    Cancer - Lung Father    Heart disease Brother        has a pacemaker   Emphysema Maternal Grandmother    Cancer - Lung Maternal Grandfather    Cancer Paternal Grandmother    Heart attack Paternal Grandfather    Colon cancer Other        maternal great aunt   Colon cancer Other        paternal great aunt    Social History   Socioeconomic History   Marital status: Legally Separated    Spouse name: Not on file   Number of children: 17  Years of education: Not on file   Highest education level: Not on file  Occupational History   Occupation: Programme researcher, 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    Social Determinants of Health   Financial Resource Strain: Not on file  Food Insecurity: Not on file  Transportation Needs: Not on file  Physical Activity: Not on file  Stress: Not on file  Social Connections: Not on file  Intimate Partner Violence:  Not on file     Physical Exam   Vitals:   02/24/22 0320 02/24/22 0346  BP:  (!) 153/82  Pulse: 93 96  Resp: (!) 21 16  Temp:  98.2 F (36.8 C)  SpO2: (!) 88% 96%    CONSTITUTIONAL: Well-appearing, NAD NEURO/PSYCH:  Alert and oriented x 3, no focal deficits EYES:  eyes equal and reactive ENT/NECK:  no LAD, no JVD CARDIO: Regular rate, well-perfused, normal S1 and S2 PULM:  CTAB no wheezing or rhonchi GI/GU:  non-distended, non-tender MSK/SPINE:  No gross deformities, no edema SKIN:  no rash, atraumatic   *Additional and/or pertinent findings included in MDM below  Diagnostic and Interventional Summary    EKG Interpretation  Date/Time:  Saturday February 23 2022 23:10:41 EST Ventricular Rate:  119 PR Interval:  157 QRS Duration: 87 QT Interval:  315 QTC Calculation: 444 R Axis:   65 Text Interpretation: Sinus tachycardia Minimal ST depression, lateral leads Baseline wander in lead(s) V1 Confirmed by Gerlene Fee [phone]) on 02/23/2022 11:57:12 PM       Labs Reviewed  BASIC METABOLIC PANEL - Abnormal; Notable for the following components:      Result Value   Glucose, Bld 327 (*)    All other components within normal limits  CBC - Abnormal; Notable for the following components:   RBC 3.16 (*)    Hemoglobin 8.4 (*)    HCT 27.3 (*)    RDW 17.4 (*)    Platelets 409 (*)    All other components within normal limits  TROPONIN I (HIGH SENSITIVITY) - Abnormal; Notable for the following components:   Troponin I (High Sensitivity) 28 (*)    All other components within normal limits  TROPONIN I (HIGH SENSITIVITY) - Abnormal; Notable for the following components:   Troponin I (High Sensitivity) 74 (*)    All other components within normal limits  HEPARIN LEVEL (UNFRACTIONATED)  CBG MONITORING, ED    DG Chest Port 1 View  Final Result      Medications  heparin ADULT infusion 100 units/mL (25000 units/250m) (has no administration in time range)  sodium chloride  0.9 % bolus 500 mL (0 mLs Intravenous Stopped 02/24/22 0052)  metoprolol tartrate (LOPRESSOR) injection 5 mg (5 mg Intravenous Given 02/23/22 2332)  heparin injection 4,000 Units (4,000 Units  Intravenous Given 02/24/22 0346)     Procedures  /  Critical Care .Critical Care  Performed by: Maudie Flakes, MD Authorized by: Maudie Flakes, MD   Critical care provider statement:    Critical care time (minutes):  80   Critical care was necessary to treat or prevent imminent or life-threatening deterioration of the following conditions: NSTEMI.   Critical care was time spent personally by me on the following activities:  Development of treatment plan with patient or surrogate, discussions with consultants, evaluation of patient's response to treatment, examination of patient, ordering and review of laboratory studies, ordering and review of radiographic studies, ordering and performing treatments and interventions, pulse oximetry, re-evaluation of patient's condition and review of old charts   ED Course and Medical Decision Making  Initial Impression and Ddx Concern for ACS given the description of symptoms.  Also concerning would be the twelve-lead EKG obtained with EMS showing fairly prominent ST depressions in V3 through V6 as well as some depressions in lead II and aVF.  (See media tab) His EKG here, now that he is pain-free, is much improved.  And so anticipating admission given the dynamic EKG changes.  Overall doubt dissection, doubt PE.  He has multiple cardiovascular risk factors.  He has not had a catheterization of his heart in several years.  Past medical/surgical history that increases complexity of ED encounter: PAD, multiple cardiovascular risk factors  Interpretation of Diagnostics I personally reviewed the EKG and my interpretation is as follows: Diffuse ST depressions  Labs reveal no significant blood count or electrolyte disturbance, troponin 28 and rising to 74.  Patient  Reassessment and Ultimate Disposition/Management     Case discussed with Dr. Radford Pax of cardiology, given the dynamic EKG changes and troponins and risk factors, the recommendation is to transfer to the [facility] emergency department for cardiology evaluation as there are no appropriate available beds for a direct admission.  Unfortunately we have no critical care buses available for transport for the next 4 hours.  Patient is symptom-free with normal vital signs at this time.  We will bolus heparin and hold the drip and he will transport with BLS EMS team to come.  Dr. Waverly Ferrari is the accepting ED provider at [facility].   Patient management required discussion with the following services or consulting groups:  Cardiology  Complexity of Problems Addressed Acute illness or injury that poses threat of life of bodily function  Additional Data Reviewed and Analyzed Further history obtained from: Prior labs/imaging results  Additional Factors Impacting ED Encounter Risk Consideration of hospitalization  Barth Kirks. Sedonia Small, [place] [email or web address]  Final Clinical Impressions(s) / ED Diagnoses     ICD-10-CM   1. Chest pain, unspecified type  R07.9       ED Discharge Orders     None        Discharge Instructions Discussed with and Provided to Patient:   Discharge Instructions   None      Maudie Flakes, MD 02/24/22 [phone]

## 2022-02-24 ENCOUNTER — Other Ambulatory Visit: Payer: Self-pay

## 2022-02-24 ENCOUNTER — Encounter (HOSPITAL_COMMUNITY): Payer: Self-pay | Admitting: Internal Medicine

## 2022-02-24 ENCOUNTER — Encounter (HOSPITAL_COMMUNITY): Payer: Self-pay

## 2022-02-24 ENCOUNTER — Inpatient Hospital Stay (HOSPITAL_COMMUNITY): Payer: Medicare HMO

## 2022-02-24 DIAGNOSIS — K219 Gastro-esophageal reflux disease without esophagitis: Secondary | ICD-10-CM | POA: Diagnosis present

## 2022-02-24 DIAGNOSIS — J449 Chronic obstructive pulmonary disease, unspecified: Secondary | ICD-10-CM | POA: Diagnosis present

## 2022-02-24 DIAGNOSIS — Z9862 Peripheral vascular angioplasty status: Secondary | ICD-10-CM | POA: Diagnosis not present

## 2022-02-24 DIAGNOSIS — E1151 Type 2 diabetes mellitus with diabetic peripheral angiopathy without gangrene: Secondary | ICD-10-CM | POA: Diagnosis present

## 2022-02-24 DIAGNOSIS — Z8249 Family history of ischemic heart disease and other diseases of the circulatory system: Secondary | ICD-10-CM | POA: Diagnosis not present

## 2022-02-24 DIAGNOSIS — I7102 Dissection of abdominal aorta: Secondary | ICD-10-CM | POA: Diagnosis present

## 2022-02-24 DIAGNOSIS — M5416 Radiculopathy, lumbar region: Secondary | ICD-10-CM | POA: Diagnosis present

## 2022-02-24 DIAGNOSIS — I739 Peripheral vascular disease, unspecified: Secondary | ICD-10-CM

## 2022-02-24 DIAGNOSIS — K922 Gastrointestinal hemorrhage, unspecified: Secondary | ICD-10-CM | POA: Diagnosis present

## 2022-02-24 DIAGNOSIS — D62 Acute posthemorrhagic anemia: Secondary | ICD-10-CM

## 2022-02-24 DIAGNOSIS — Z8262 Family history of osteoporosis: Secondary | ICD-10-CM | POA: Diagnosis not present

## 2022-02-24 DIAGNOSIS — Z825 Family history of asthma and other chronic lower respiratory diseases: Secondary | ICD-10-CM | POA: Diagnosis not present

## 2022-02-24 DIAGNOSIS — Z96652 Presence of left artificial knee joint: Secondary | ICD-10-CM | POA: Diagnosis present

## 2022-02-24 DIAGNOSIS — Z794 Long term (current) use of insulin: Secondary | ICD-10-CM | POA: Diagnosis not present

## 2022-02-24 DIAGNOSIS — F17210 Nicotine dependence, cigarettes, uncomplicated: Secondary | ICD-10-CM | POA: Diagnosis present

## 2022-02-24 DIAGNOSIS — D509 Iron deficiency anemia, unspecified: Secondary | ICD-10-CM | POA: Diagnosis not present

## 2022-02-24 DIAGNOSIS — I214 Non-ST elevation (NSTEMI) myocardial infarction: Secondary | ICD-10-CM | POA: Diagnosis present

## 2022-02-24 DIAGNOSIS — E782 Mixed hyperlipidemia: Secondary | ICD-10-CM | POA: Diagnosis present

## 2022-02-24 DIAGNOSIS — Z1152 Encounter for screening for COVID-19: Secondary | ICD-10-CM | POA: Diagnosis not present

## 2022-02-24 DIAGNOSIS — I10 Essential (primary) hypertension: Secondary | ICD-10-CM | POA: Diagnosis present

## 2022-02-24 DIAGNOSIS — Z7902 Long term (current) use of antithrombotics/antiplatelets: Secondary | ICD-10-CM | POA: Diagnosis not present

## 2022-02-24 DIAGNOSIS — Z7982 Long term (current) use of aspirin: Secondary | ICD-10-CM | POA: Diagnosis not present

## 2022-02-24 DIAGNOSIS — I25110 Atherosclerotic heart disease of native coronary artery with unstable angina pectoris: Secondary | ICD-10-CM | POA: Diagnosis present

## 2022-02-24 DIAGNOSIS — R079 Chest pain, unspecified: Secondary | ICD-10-CM

## 2022-02-24 DIAGNOSIS — M1990 Unspecified osteoarthritis, unspecified site: Secondary | ICD-10-CM | POA: Diagnosis present

## 2022-02-24 DIAGNOSIS — Z8673 Personal history of transient ischemic attack (TIA), and cerebral infarction without residual deficits: Secondary | ICD-10-CM | POA: Diagnosis not present

## 2022-02-24 LAB — CBC
HCT: 25.2 % — ABNORMAL LOW (ref 39.0–52.0)
HCT: 29.9 % — ABNORMAL LOW (ref 39.0–52.0)
Hemoglobin: 7.8 g/dL — ABNORMAL LOW (ref 13.0–17.0)
Hemoglobin: 9.5 g/dL — ABNORMAL LOW (ref 13.0–17.0)
MCH: 26.8 pg (ref 26.0–34.0)
MCH: 27.3 pg (ref 26.0–34.0)
MCHC: 31 g/dL (ref 30.0–36.0)
MCHC: 31.8 g/dL (ref 30.0–36.0)
MCV: 86.6 fL (ref 80.0–100.0)
MCV: 85.9 fL (ref 80.0–100.0)
Platelets: 390 10*3/uL (ref 150–400)
Platelets: 374 10*3/uL (ref 150–400)
RBC: 2.91 MIL/uL — ABNORMAL LOW (ref 4.22–5.81)
RBC: 3.48 MIL/uL — ABNORMAL LOW (ref 4.22–5.81)
RDW: 17.5 % — ABNORMAL HIGH (ref 11.5–15.5)
RDW: 17.1 % — ABNORMAL HIGH (ref 11.5–15.5)
WBC: 6.4 10*3/uL (ref 4.0–10.5)
WBC: 11.6 10*3/uL — ABNORMAL HIGH (ref 4.0–10.5)
nRBC: 0 % (ref 0.0–0.2)
nRBC: 0 % (ref 0.0–0.2)

## 2022-02-24 LAB — CBG MONITORING, ED
Glucose-Capillary: 178 mg/dL — ABNORMAL HIGH (ref 70–99)
Glucose-Capillary: 194 mg/dL — ABNORMAL HIGH (ref 70–99)
Glucose-Capillary: 177 mg/dL — ABNORMAL HIGH (ref 70–99)

## 2022-02-24 LAB — POC OCCULT BLOOD, ED: Fecal Occult Bld: POSITIVE — AB

## 2022-02-24 LAB — ECHOCARDIOGRAM COMPLETE
Area-P 1/2: 3.53 cm2
Height: 67 in
S' Lateral: 2.6 cm
Weight: 2246.93 oz

## 2022-02-24 LAB — RAPID URINE DRUG SCREEN, HOSP PERFORMED
Amphetamines: NOT DETECTED
Barbiturates: NOT DETECTED
Benzodiazepines: NOT DETECTED
Cocaine: NOT DETECTED
Opiates: NOT DETECTED
Tetrahydrocannabinol: NOT DETECTED

## 2022-02-24 LAB — GLUCOSE, CAPILLARY
Glucose-Capillary: 168 mg/dL — ABNORMAL HIGH (ref 70–99)
Glucose-Capillary: 127 mg/dL — ABNORMAL HIGH (ref 70–99)

## 2022-02-24 LAB — TROPONIN I (HIGH SENSITIVITY)
Troponin I (High Sensitivity): 74 ng/L — ABNORMAL HIGH (ref <18)
Troponin I (High Sensitivity): 150 ng/L (ref <18)
Troponin I (High Sensitivity): 133 ng/L (ref <18)
Troponin I (High Sensitivity): 143 ng/L (ref <18)

## 2022-02-24 LAB — HEMOGLOBIN AND HEMATOCRIT, BLOOD
HCT: 30.6 % — ABNORMAL LOW (ref 39.0–52.0)
Hemoglobin: 9.9 g/dL — ABNORMAL LOW (ref 13.0–17.0)

## 2022-02-24 LAB — HIV ANTIBODY (ROUTINE TESTING W REFLEX): HIV Screen 4th Generation wRfx: NONREACTIVE

## 2022-02-24 LAB — CREATININE, SERUM
Creatinine, Ser: 0.89 mg/dL (ref 0.61–1.24)
GFR, Estimated: >60 mL/min (ref >60)

## 2022-02-24 LAB — PREPARE RBC (CROSSMATCH)

## 2022-02-24 MED ORDER — INSULIN ASPART 100 UNIT/ML IJ SOLN: 0.0000 [IU] | Freq: Every day | INTRAMUSCULAR | Status: DC

## 2022-02-24 MED ORDER — SODIUM CHLORIDE 0.9 % IV SOLN: 250.0000 mL | INTRAVENOUS | Status: DC | PRN

## 2022-02-24 MED ORDER — NICOTINE 14 MG/24HR TD PT24
14.0000 mg | MEDICATED_PATCH | Freq: Every day | TRANSDERMAL | Status: DC
  Administered 2022-02-24: 14 mg via TRANSDERMAL
  Filled 2022-02-24: qty 1

## 2022-02-24 MED ORDER — SODIUM CHLORIDE 0.9% IV SOLUTION: Freq: Once | INTRAVENOUS | Status: AC

## 2022-02-24 MED ORDER — SODIUM CHLORIDE 0.9% FLUSH: 3.0000 mL | INTRAVENOUS | Status: DC | PRN

## 2022-02-24 MED ORDER — METOPROLOL TARTRATE 12.5 MG HALF TABLET
12.5000 mg | ORAL_TABLET | Freq: Two times a day (BID) | ORAL | Status: DC
  Administered 2022-02-24 (×2): 12.5 mg via ORAL
  Filled 2022-02-24 (×2): qty 1

## 2022-02-24 MED ORDER — ASPIRIN 81 MG PO TBEC
81.0000 mg | DELAYED_RELEASE_TABLET | Freq: Every day | ORAL | Status: DC
  Administered 2022-02-25: 81 mg via ORAL
  Filled 2022-02-24 (×2): qty 1

## 2022-02-24 MED ORDER — ALBUTEROL SULFATE (2.5 MG/3ML) 0.083% IN NEBU: 2.5000 mg | INHALATION_SOLUTION | RESPIRATORY_TRACT | Status: DC | PRN

## 2022-02-24 MED ORDER — INSULIN ASPART 100 UNIT/ML IJ SOLN
0.0000 [IU] | Freq: Three times a day (TID) | INTRAMUSCULAR | Status: DC
  Administered 2022-02-24 – 2022-02-25 (×4): 3 [IU] via SUBCUTANEOUS

## 2022-02-24 MED ORDER — HEPARIN (PORCINE) 25000 UT/250ML-% IV SOLN
750.0000 [IU]/h | INTRAVENOUS | Status: DC
  Administered 2022-02-24: 750 [IU]/h via INTRAVENOUS
  Filled 2022-02-24: qty 250

## 2022-02-24 MED ORDER — ROSUVASTATIN CALCIUM 20 MG PO TABS
40.0000 mg | ORAL_TABLET | Freq: Every day | ORAL | Status: DC
  Administered 2022-02-24: 40 mg via ORAL
  Filled 2022-02-24: qty 2

## 2022-02-24 MED ORDER — SODIUM CHLORIDE 0.9% FLUSH
3.0000 mL | Freq: Two times a day (BID) | INTRAVENOUS | Status: DC
  Administered 2022-02-24 (×2): 3 mL via INTRAVENOUS

## 2022-02-24 MED ORDER — INSULIN GLARGINE-YFGN 100 UNIT/ML ~~LOC~~ SOLN
10.0000 [IU] | Freq: Every day | SUBCUTANEOUS | Status: DC
  Administered 2022-02-24: 10 [IU] via SUBCUTANEOUS
  Filled 2022-02-24 (×2): qty 0.1

## 2022-02-24 MED ORDER — ONDANSETRON HCL 4 MG/2ML IJ SOLN
4.0000 mg | Freq: Four times a day (QID) | INTRAMUSCULAR | Status: DC | PRN
  Administered 2022-02-24: 4 mg via INTRAVENOUS
  Filled 2022-02-24: qty 2

## 2022-02-24 MED ORDER — HEPARIN SODIUM (PORCINE) 5000 UNIT/ML IJ SOLN
4000.0000 [IU] | Freq: Once | INTRAMUSCULAR | Status: AC
  Administered 2022-02-24: 4000 [IU] via INTRAVENOUS
  Filled 2022-02-24: qty 1

## 2022-02-24 MED ORDER — HEPARIN SODIUM (PORCINE) 5000 UNIT/ML IJ SOLN
5000.0000 [IU] | Freq: Three times a day (TID) | INTRAMUSCULAR | Status: DC
  Administered 2022-02-24 (×2): 5000 [IU] via SUBCUTANEOUS
  Filled 2022-02-24 (×2): qty 1

## 2022-02-24 MED ORDER — ACETAMINOPHEN 325 MG PO TABS
650.0000 mg | ORAL_TABLET | ORAL | Status: DC | PRN
  Administered 2022-02-24: 650 mg via ORAL
  Filled 2022-02-24: qty 2

## 2022-02-24 MED ORDER — PANTOPRAZOLE SODIUM 40 MG PO TBEC
40.0000 mg | DELAYED_RELEASE_TABLET | Freq: Every day | ORAL | Status: DC
  Administered 2022-02-24: 40 mg via ORAL
  Filled 2022-02-24: qty 1

## 2022-02-24 MED ORDER — NITROGLYCERIN 0.4 MG SL SUBL: 0.4000 mg | SUBLINGUAL_TABLET | SUBLINGUAL | Status: DC | PRN

## 2022-02-24 MED ORDER — FAMOTIDINE 20 MG PO TABS
20.0000 mg | ORAL_TABLET | Freq: Two times a day (BID) | ORAL | Status: DC
  Administered 2022-02-24 (×2): 20 mg via ORAL
  Filled 2022-02-24 (×2): qty 1

## 2022-02-24 NOTE — ED Provider Notes (Signed)
I did not receive direct report on this patient however I did review the previous provider note and spoke with patient.   Patient w/ h/o nonobstructive CAD and stented carotids who had chest pain with exertion yesterday, crushing, severe, improved with rest. Subsequently started having same pain at rest. Presented to [facility] ED. ECG there with ST depressions but no elevations or STEMI criteria. Heparin bolus given. D/w Cardiology, sent here for cards eval.   Patient now CP free. Sleeping comfortably. Lungs clear. Abdomen clear. No obvious LE edema. Will initiate heparin infusion. Already had aspirin. Will recheck ecg. Recheck troponins. Cards consulted, will see in AM, but recommends [facility] admission.   D/w Dr. Alcario Drought for admission.   CRITICAL CARE Performed by: Merrily Pew Total critical care time: 35 minutes Critical care time was exclusive of separately billable procedures and treating other patients. Critical care was necessary to treat or prevent imminent or life-threatening deterioration. Critical care was time spent personally by me on the following activities: development of treatment plan with patient and/or surrogate as well as nursing, discussions with consultants, evaluation of patient's response to treatment, examination of patient, obtaining history from patient or surrogate, ordering and performing treatments and interventions, ordering and review of laboratory studies, ordering and review of radiographic studies, pulse oximetry and re-evaluation of patient's condition.    , Corene Cornea, MD 02/24/22 [phone]

## 2022-02-24 NOTE — ED Triage Notes (Signed)
Pt bib [facility] from [facility] as a NSTEMI. Axo x4, chest pain initially, no pain reported now. Heparin bolus given at [facility]. Wearing heart monitor from doctors office.   BP 138/82 SPO2 96% 2LNC

## 2022-02-24 NOTE — Progress Notes (Signed)
Update:  Troponin continues to uptrend, currently 150.  Will continue to follow.  He just had a soft brown stool so planning to continue heparin unless GI feels differently.  Dr. Harl Bowie is aware.  Meanwhile, CBC continues to drop and he is heme positive.  Will transfuse 2 units for now with goal Hgb >10 given ACS.  I have also consulted GI since he appears to need a semi-urgent scope in the setting of subacute GI bleeding and ACS.  Dr. Bryan Lemma is aware.  Patient is admitted to progressive care at this time.  Carlyon Shadow, M.D.

## 2022-02-24 NOTE — Progress Notes (Signed)
*  PRELIMINARY RESULTS* Echocardiogram 2D Echocardiogram has been performed.  Jimmy Duran 02/24/2022, 2:37 PM

## 2022-02-24 NOTE — ED Notes (Signed)
Dr Sedonia Small at bedside explaining to pt the plan of care and results, pt made aware of transferring to [facility]. Pt calling wife at this time.

## 2022-02-24 NOTE — Consult Note (Addendum)
Cardiology Consultation   Patient ID: Jimmy Duran MRN: [id number]; DOB: 08-19-1955  Admit date: 02/23/2022 Date of Consult: 02/24/2022  PCP:  [name], [place] [facility] Cardiologist:  [name], MD        Patient Profile:   Jimmy Duran is a 66 y.o. male with a hx of nonobstructive CAD by report, hypertension, hyperlipidemia, DM2, PAD s/p stenting of SMA 07/2020 and carotid artery disease s/p right CEA 2021 who is being seen 02/24/2022 for the evaluation of chest pain with mildly elevated troponin at the request of Dr. Lorin Mercy.  History of Present Illness:   Jimmy Duran is a 66 year old male with past medical history of nonobstructive CAD by report, hypertension, hyperlipidemia, DM2, PAD s/p stenting of SMA 07/2020 and carotid artery disease s/p right CEA 2021.  Patient was seen by Dr. Johnsie Cancel remotely in 2008.  Per report, he had a cardiac catheterization in the past that showed nonobstructive disease.  Myoview in 2015 was low risk.  Last echo obtained on 12/11/2018 showed EF 60 to 65%, normal RV.  He underwent a left TCAR in August 2023 by vascular surgery, no complication afterward.  Patient was seen by [name], PA-C in September 2023, he complained of a 40 pound unintentional weight loss in the past year.  Patient also complained of some dizziness as well.  Orthostatic vital sign was obtained, systolic blood pressure only dropped by 10 points.  His lisinopril-HCTZ 20-12.5 was discontinued and he was given lisinopril by itself at 20 mg daily.  He returned to follow-up and saw Finis Bud NP on 02/21/2022 at which time he has already self discontinued lisinopril as his systolic blood pressure was dropping down to the 80s.  Since stopping the lisinopril, his dizziness has improved.  We recommended echocardiogram and a heart monitor.  Patient presented to [facility] ED on 02/23/2022 complaining of 2 episodes of substernal chest discomfort.   According to the patient, he has been experiencing intermittent chest discomfort for the past month.  He is not sure if the chest discomfort is related to exertion.  Yesterday, while sweeping and mopping the floor, he had 15 minutes of chest discomfort.  He described the chest pain as a sharp pain, may be slightly worse with cough.  He sat down on the sofa to rest, however chest pain returned and this time lasted roughly 20 to 30 minutes before going away.  This prompted the patient to seek urgent medical attention at local ED.  On arrival blood pressure was 140/79.  Heart rate was 123.  EKG showed mild sinus tachycardia with minimal ST depression in the lateral leads, <81m.  Serial troponin was 28--> 74.  Hemoglobin was 8.4, this is lower than 3 weeks ago when his hemoglobin was 10.1.  Talking with patient, his PCP has noted his recent anemia and has ordered 3 regiment of iron infusion.  He has been noticing slightly darker stools.  He denies any frank bleeding.  At the time my interview, he denies any further chest discomfort.  His last episode of chest pain was yesterday late afternoon.   Past Medical History:  Diagnosis Date   AAA (abdominal aortic aneurysm) (HCC)    Arthritis    Carotid artery disease (HCC)    Colon polyp    COPD (chronic obstructive pulmonary disease) (HCC)    Coronary artery disease    Nonobstructive   Essential hypertension    GERD (gastroesophageal reflux disease)  Hemorrhoids    History of TIA (transient ischemic attack)    Low back pain    Lumbar radiculopathy    Mixed hyperlipidemia    Palpitations    Peripheral vascular disease ([place])    Shingles 09/23/2019   Tobacco dependence    Type 2 diabetes mellitus [facility])     Past Surgical History:  Procedure Laterality Date   APPENDECTOMY  1970   BIOPSY  12/08/2017   Procedure: BIOPSY;  Surgeon: [name], MD;  [facility];  Service: Endoscopy;;  esophagus    BIOPSY  12/28/2020   Procedure:  BIOPSY;  Surgeon: [name], MD;  [facility];  Service: Endoscopy;;   BIOPSY  07/12/2021   Procedure: BIOPSY;  Surgeon: [name], MD;  [facility];  Service: Endoscopy;;   BREAST SURGERY Right    Cyst resection on the right   CARDIAC CATHETERIZATION  1990's   in [place]. no stent placement   CATARACT EXTRACTION W/PHACO Right 06/18/2016   Procedure: CATARACT EXTRACTION PHACO AND INTRAOCULAR LENS PLACEMENT ([place]);  Surgeon: [name], MD;  Location: [facility] OR[facility];  Service: Ophthalmology;  Laterality: Right;  [name] 35.7 AP% 16.6 CDE 5.94 Fluid pack lot # [id number]   CATARACT EXTRACTION W/PHACO Left 07/16/2016   Procedure: CATARACT EXTRACTION PHACO AND INTRAOCULAR LENS PLACEMENT ([facility]);  Surgeon: [name], MD;  Location: [facility];  Service: Ophthalmology;  Laterality: Left;  [name] 51.8 AP% 12.7 CDE 6.58 Fluid Pack Lot # [id number]   CHOLECYSTECTOMY  1993   COLONOSCOPY  02/12/2007   XUX:YBFXOVANV friable anal canal hemorrhoids, otherwise normal rectum and colon   COLONOSCOPY N/A 07/16/2012   RMR: Colonic diverticulosis. 4 mm tubular adenoma   COLONOSCOPY WITH PROPOFOL N/A 12/08/2017   Dr. Gala Romney: sigmoid and descending colon diverticulosis, transverse colon polyp (TUBULAR ADENOMA)   ENDARTERECTOMY Right 10/08/2019   Procedure: RIGHT CAROTID ENDARTERECTOMY;  Surgeon: Rosetta Posner, MD;  Location: [facility];  Service: Vascular;  Laterality: Right;   ESOPHAGOGASTRODUODENOSCOPY (EGD) WITH ESOPHAGEAL DILATION N/A 07/16/2012   RMR: +Candida esophagitis, Erosive reflux esophagiits. Schatizi's ring status post dilation. Hiatal hernia. Antral erosions status post biopsy.    ESOPHAGOGASTRODUODENOSCOPY (EGD) WITH PROPOFOL N/A 12/08/2017   Dr. Gala Romney: esophagitis, query EOE but negative for increased eosinophils on path, small hiatal hernia, normal stomach, normal duodenum, PAS stain with rare yeast forms on stain. Empiric dilation   ESOPHAGOGASTRODUODENOSCOPY (EGD)  WITH PROPOFOL N/A 12/28/2020   esophagitis without bleeding, s/p biopsy and dilation. Antral erosions s/p biopsy.   ESOPHAGOGASTRODUODENOSCOPY (EGD) WITH PROPOFOL N/A 07/12/2021   Surgeon: [name], MD;  Cobblestoning appearance of distal esophagus, slight narrowing of distal esophagus s/p dilation with 77 Fr and biopsy, gastric erosions biopsied, normal examined duodenum.  Esophageal biopsy with reactive squamous mucosa, gastric biopsy with reactive gastropathy, negative for H. pylori.   EYE SURGERY Bilateral 2018   cataract   HERNIA REPAIR     JOINT REPLACEMENT     KNEE SURGERY     Left knee arthroscopy torn medial meniscus grade 4 chondral changes medial femoral condyle tibial plateau   MALONEY DILATION N/A 12/08/2017   Procedure: MALONEY DILATION;  Surgeon: [name], MD;  [facility];  Service: Endoscopy;  Laterality: N/A;   MALONEY DILATION N/A 12/28/2020   Procedure: Venia Minks DILATION;  Surgeon: [name], MD;  [facility];  Service: Endoscopy;  Laterality: N/A;   MALONEY DILATION N/A 07/12/2021   Procedure: Venia Minks  DILATION;  Surgeon: [name], MD;  [facility];  Service: Endoscopy;  Laterality: N/A;   PATCH ANGIOPLASTY Right 10/08/2019   Procedure: PATCH ANGIOPLASTY of the right common carotid artery using hemashield plaltinum finesse patch;  Surgeon: Rosetta Posner, MD;  Location: [place] OR;  Service: Vascular;  Laterality: Right;   PERIPHERAL VASCULAR INTERVENTION  07/17/2020   Procedure: PERIPHERAL VASCULAR INTERVENTION;  Surgeon: Waynetta Sandy, MD;  Location: [place] [facility];  Service: Cardiovascular;;   POLYPECTOMY  12/08/2017   Procedure: POLYPECTOMY;  Surgeon: [name], MD;  [facility];  Service: Endoscopy;;  colon   TOTAL KNEE ARTHROPLASTY Left 04/27/2012   Procedure: TOTAL KNEE ARTHROPLASTY;  Surgeon: Carole Civil, MD;  Location: [facility];  Service: Orthopedics;  Laterality: Left;    TRANSCAROTID ARTERY REVASCULARIZATION  Left 10/12/2021   Procedure: Left Transcarotid Artery Revascularization;  Surgeon: Waynetta Sandy, MD;  Location: [place];  Service: Vascular;  Laterality: Left;   [place] N/A 07/17/2020   Procedure: mesenteric ANGIOGRAPHY;  Surgeon: Waynetta Sandy, MD;  Location: [place] [facility];  Service: Cardiovascular;  Laterality: N/A;     Home Medications:  Prior to Admission medications   Medication Sig Start Date End Date Taking? Authorizing Provider  albuterol (VENTOLIN HFA) 108 (90 Base) MCG/ACT inhaler Inhale 2 puffs into the lungs every 6 (six) hours as needed for wheezing or shortness of breath. 08/09/21  Yes Paseda, Dewaine Conger, FNP  diphenhydrAMINE (BENADRYL) 25 MG tablet Take 25 mg by mouth daily as needed for allergies.    Yes [provider]  aspirin EC 81 MG tablet Take 1 tablet (81 mg total) by mouth daily at 6 (six) AM. Swallow whole. 10/14/21   Rhyne, Hulen Shouts, PA-C  clopidogrel (PLAVIX) 75 MG tablet Take 1 tablet (75 mg total) by mouth daily. 08/09/21 08/09/22  PasedaDewaine Conger, FNP  Continuous Blood Gluc Sensor (FREESTYLE LIBRE 3 SENSOR) MISC 1 each by Does not apply route every 14 (fourteen) days. Place 1 sensor on the skin every 14 days. Use to check glucose continuously 12/19/21   [name], MD  empagliflozin (JARDIANCE) 25 MG TABS tablet Take 1 tablet (25 mg total) by mouth daily before breakfast. 01/08/22 04/08/22  [name], MD  famotidine (PEPCID) 20 MG tablet Take 1 tablet (20 mg total) by mouth 2 (two) times daily. 05/08/21   Annitta Needs, NP  Glucagon (GVOKE HYPOPEN 2-PACK) 1 MG/0.2ML SOAJ Inject 1 mg into the skin as needed (hypoglycemia). 12/11/21   [name], MD  insulin glargine (LANTUS) 100 UNIT/ML injection Inject 0.18 mLs (18 Units total) into the skin daily. 12/25/21   [name], MD  lansoprazole (PREVACID) 30 MG capsule Take 1  capsule (30 mg total) by mouth 2 (two) times daily before a meal. 10/19/21   Erenest Rasher, PA-C  metFORMIN (GLUCOPHAGE) 1000 MG tablet Take 1 tablet (1,000 mg total) by mouth 2 (two) times daily with a meal. 09/11/21   Paseda, Dewaine Conger, FNP  metoprolol tartrate (LOPRESSOR) 25 MG tablet Take 0.5 tablets (12.5 mg total) by mouth 2 (two) times daily. 10/29/21   Satira Sark, MD  NOVOLOG 100 UNIT/ML injection Inject 14 Units into the skin 3 (three) times daily with meals. 01/08/22   [name], MD  rosuvastatin (CRESTOR) 40 MG tablet Take 1 tablet (40 mg total) by mouth daily. 08/14/21  Satira Sark, MD    Inpatient Medications: Scheduled Meds:  aspirin EC  81 mg Oral Q0600   famotidine  20 mg Oral BID   insulin aspart  0-15 Units Subcutaneous TID WC   insulin aspart  0-5 Units Subcutaneous QHS   insulin glargine-yfgn  10 Units Subcutaneous Daily   metoprolol tartrate  12.5 mg Oral BID   nicotine  14 mg Transdermal Daily   pantoprazole  40 mg Oral Daily   rosuvastatin  40 mg Oral Daily   sodium chloride flush  3 mL Intravenous Q12H   Continuous Infusions:  sodium chloride     heparin 750 Units/hr (02/24/22 0452)   PRN Meds: sodium chloride, acetaminophen, albuterol, nitroGLYCERIN, ondansetron (ZOFRAN) IV, sodium chloride flush  Allergies:   No Known Allergies  Social History:   Social History   Socioeconomic History   Marital status: Legally Separated    Spouse name: Not on file   Number of children: 5   Years of education: Not on file   Highest education level: Not on file  Occupational History   Occupation: Disabled    Employer: [employer]  Tobacco Use   Smoking status: Every Day    Packs/day: 1.00    Years: 53.00    Total pack years: 53.00    Types: Cigarettes   Smokeless tobacco: Former    Types: Chew   Tobacco comments:    0.5 pk per day currently   Vaping Use   Vaping Use: Former  Substance and Sexual Activity   Alcohol use: Yes     Comment: socially "beer and tomato juice"    Drug use: No   Sexual activity: Yes    Birth control/protection: None  Other Topics Concern   Not on file  Social History Narrative   Lives alone.    Social Determinants of Health   Financial Resource Strain: Not on file  Food Insecurity: Not on file  Transportation Needs: Not on file  Physical Activity: Not on file  Stress: Not on file  Social Connections: Not on file  Intimate Partner Violence: Not on file    Family History:    Family History  Problem Relation Age of Onset   Dementia Mother    Hypertension Mother    Colon polyps Mother    Osteoporosis Mother    Cancer Father    Hypertension Father    Coronary artery disease Father        CABG in his 31's   Heart attack Father    Cancer - Lung Father    Heart disease Brother        has a pacemaker   Emphysema Maternal Grandmother    Cancer - Lung Maternal Grandfather    Cancer Paternal Grandmother    Heart attack Paternal Grandfather    Colon cancer Other        maternal great aunt   Colon cancer Other        paternal great aunt     ROS:  Please see the history of present illness.   All other ROS reviewed and negative.     Physical Exam/Data:   Vitals:   02/24/22 0322 02/24/22 0346 02/24/22 0457 02/24/22 0715  BP:  (!) 153/82 136/81 (!) 147/92  Pulse:  96 96 92  Resp:  '16 19 17  '$ Temp:  98.2 F (36.8 C) 98.3 F (36.8 C) 98.1 F (36.7 C)  TempSrc:  Oral Oral Oral  SpO2:  96% 98% 97%  Weight: 63.7 kg     Height: '5\' 7"'$  (1.702 m)       Intake/Output Summary (Last 24 hours) at 02/24/2022 0837 Last data filed at 02/24/2022 0052 Gross per 24 hour  Intake 500 ml  Output --  Net 500 ml      02/24/2022    3:22 AM 02/21/2022    9:22 AM 02/05/2022   11:02 AM  Last 3 Weights  Weight (lbs) 140 lb 6.9 oz 140 lb 6.4 oz 138 lb 3.2 oz  Weight (kg) 63.7 kg 63.685 kg 62.687 kg     Body mass index is 21.99 kg/m.  General:  Well nourished, well developed, in  no acute distress HEENT: normal Neck: no JVD Vascular: No carotid bruits; Distal pulses 2+ bilaterally Cardiac:  normal S1, S2; RRR; no murmur  Lungs:  clear to auscultation bilaterally, no wheezing, rhonchi or rales  Abd: soft, nontender, no hepatomegaly  Ext: no edema Musculoskeletal:  No deformities, BUE and BLE strength normal and equal Skin: warm and dry  Neuro:  CNs 2-12 intact, no focal abnormalities noted Psych:  Normal affect   EKG:  The EKG was personally reviewed and demonstrates: Sinus tachycardia with minimal ST depression in the lateral leads, <88m. Telemetry:  Telemetry was personally reviewed and demonstrates: At the time of the interview, he was being hooked up to telemetry  Relevant CV Studies:  Echo 12/11/2018  1. Left ventricular ejection fraction, by visual estimation, is 60 to  65%. The left ventricle has normal function. There is moderately increased  left ventricular hypertrophy.   2. Left ventricular diastolic Doppler parameters are consistent with  impaired relaxation pattern of LV diastolic filling.   3. Global right ventricle has low normal systolic function.The right  ventricular size is normal. No increase in right ventricular wall  thickness.   4. Left atrial size was normal.   5. Right atrial size was normal.   6. Mild mitral annular calcification.   7. Mild to moderate aortic valve annular calcification.   8. The mitral valve is degenerative. No evidence of mitral valve  regurgitation.   9. The tricuspid valve is grossly normal. Tricuspid valve regurgitation  was not visualized by color flow Doppler.  10. The aortic valve is tricuspid Aortic valve regurgitation was not  visualized by color flow Doppler. Mild aortic valve sclerosis without  stenosis.  11. The pulmonic valve was not well visualized. Pulmonic valve  regurgitation is not visualized by color flow Doppler.  12. The inferior vena cava is normal in size with greater than 50%   respiratory variability, suggesting right atrial pressure of 3 mmHg.    Laboratory Data:  High Sensitivity Troponin:   Recent Labs  Lab 02/23/22 2257 02/24/22 0159  TROPONINIHS 28* 74*     Chemistry Recent Labs  Lab 02/23/22 2257  NA 138  K 3.7  CL 104  CO2 23  GLUCOSE 327*  BUN 13  CREATININE 1.20  CALCIUM 8.9  GFRNONAA >60  ANIONGAP 11    No results for input(s): "PROT", "ALBUMIN", "AST", "ALT", "ALKPHOS", "BILITOT" in the last 168 hours. Lipids No results for input(s): "CHOL", "TRIG", "HDL", "LABVLDL", "LDLCALC", "CHOLHDL" in the last 168 hours.  Hematology Recent Labs  Lab 02/23/22 2257  WBC 7.2  RBC 3.16*  HGB 8.4*  HCT 27.3*  MCV 86.4  MCH 26.6  MCHC 30.8  RDW 17.4*  PLT 409*   Thyroid No results for input(s): "TSH", "FREET4" in the last 168 hours.  BNPNo  results for input(s): "BNP", "PROBNP" in the last 168 hours.  DDimer No results for input(s): "DDIMER" in the last 168 hours.   Radiology/Studies:  DG Chest Port 1 View  Result Date: 02/23/2022 CLINICAL DATA:  Midsternal chest pain with nausea and vomiting EXAM: PORTABLE CHEST 1 VIEW COMPARISON:  CT chest 09/06/2021 FINDINGS: Normal cardiomediastinal silhouette. Aortic atherosclerotic calcification. No focal consolidation, pleural effusion, or pneumothorax. No acute osseous abnormality. Electronic device projects over the left axilla. Partially visualized vascular stent in the left neck. IMPRESSION: No active disease. Electronically Signed   By: Placido Sou M.D.   On: 02/23/2022 23:18     Assessment and Plan:   NSTEMI  -2 episodes of substernal sharp chest discomfort yesterday, first episode occurred while sweeping and mopping the floor, lasted 15 to 20 minutes, second episode occurred shortly after while sitting down that lasted 20 to 30 minutes.  EKG showed minimal ST depression in the lateral leads, less than 1 mm.  Chest pain has resolved and has not occurred again overnight.  -Will need to  evaluate patient for anemia first.  Obtain echocardiogram.  Will discuss with MD regarding noninvasive work up versus cardiac catheterization. Noninvasive work up such as myoview or coronary CT may allow [name] to bypass cath, however due to holiday schedule, both noninvasive study and invasive study will be delayed until Tue.   Anemia: He has received 3 rounds of iron infusion in the recent weeks ordered by PCP.  Unintentional weight loss: patient reportedly lost close to 40 lbs in the last year.  CAD: Remote cardiac catheterization with nonobstructive disease by report.  Cardiac catheterization has occurred since at least 2008 or earlier.  No report is available to me.  Myoview in 2015 was low risk.  Echocardiogram in 2020 showed normal EF.  Hypertension: He was taken off of Lisinopril-HCTZ 20-12.'5mg'$  daily recently due to dizziness, and was placed on lisinopril '20mg'$  by itself however lisinopril was discontinued later due to hypotension.  Hyperlipidemia: On Crestor  DM2: Per primary team  PAD: Underwent SMA stenting in May 2022 and also carotid artery enterectomy in 2021.  Recently underwent L TCAR in August 2023.  On aspirin and Plavix   Risk Assessment/Risk Scores:     TIMI Risk Score for Unstable Angina or Non-ST Elevation MI:   The patient's TIMI risk score is 5, which indicates a 26% risk of all cause mortality, new or recurrent myocardial infarction or need for urgent revascularization in the next 14 days.          For questions or updates, please contact [place] Please consult [email or web address] for contact info under    Signed, Almyra Deforest, [place]  02/24/2022 8:37 AM  Attending note Patient seen and discussed with PA Eulas Post, I agree with his documentation  66 yo male history of nonobstructive CAD, HTN, HL, DM2, PAD, carotid stenosis with right CEA, presents with chest pain Chest pain while sweeping and mopping floor with associated SOB/DOE, resolved with rest.    K 3.7  Cr 1.2 BUN 13 WBC 7.2 Hgb 8.4 Plt 409 Trop 28-->74-->150-->133 EKG sinus tach, mild lateral precordial ST depression nonspecific   1.Chest pain/NSTEMI - extensive history of PAD, from notes nonobstructive CAD from remote cath - presents with exertional chest pain, trops peak 150 now declining. EKG sinus tach nonspecific lateral precordial mild ST depressions  -presentation complicated by anemia, GI bleed. Hgb down to 7.8 (10.1 3 weeks ago), hemoccult + - he has been on ASA and  plavix from vascular standpoint with prior TCAR 10/2021, not for a cardiac condition  - d/c heparin given declining Hgb. Continue ASA, plavix has been held which seems reasonable. Long term considerations would need to be discussed with vascular as he is on for prior cartotid intervention - continue lopressor, asa, atorva - no plans for cath right now in setting of GI bleed, pending clinical course could reconsider -f/u echo   2. Anemia - Hgb 7.8, down from 10.1 3 weeks ago - hemoccult +   Carlyle Dolly MD

## 2022-02-24 NOTE — Consult Note (Addendum)
Attending physician's note   I have taken a history, reviewed the chart, and examined the patient. I performed a substantive portion of this encounter, including complete performance of at least one of the key components, in conjunction with the APP. I agree with the APP's note, impression, and recommendations with my edits.   66 year old male with medical history as outlined below, to include history of CAD, PAD s/p stenting of SMA in 07/2020 (on ASA/Plavix), right CEA, HTN, HLD, diabetes, presents to the ER with chest pain, diagnosed with NSTEMI.  GI service consulted due to acute on chronic anemia.  He denies any recent overt bleeding, no nausea/vomiting, abdominal pain.  Does take BCs.  He had been following with GI as an outpatient for workup of his iron deficiency anemia, most recently seen on 12/5.  Has been treated with IV iron x 3 (did not tolerate oral iron), and was planning on EGD/colonoscopy in 2024 (not yet scheduled).  Did have an EGD earlier this year which was notable for lower esophageal esophagitis with narrowing dilated 70 French, gastric erosions.  Admission labs notable for the following: - H/H 8.4/27.3 --> 7.8/25.2 - MCV/RDW 86/17.5 - BUN/creatinine 13/1.2 - Troponin 28 --> 74 --> 150 - FOBT positive  Comparison labs reviewed: - 10/04/2021: H/H 14.2/40.6, MCV/RDW 92/13 - 12/11/2021: H/H 10.9/34, MCV/RDW 86/14.  Ferritin 20, iron 44, TIBC 412, sat 11% - 01/29/2022: H/H 10.1/32.3, MCV/RDW 88/19.6   1) Acute on chronic iron deficiency anemia Had been having a slowly downtrending H/H since at least August, with labs consistent with IDA.  Has received 3 IV iron infusions, and now presents with acute worsening H/H.  Denies any overt bleeding, but heme positive stool in the ER.  Discussed appropriate evaluation, to include EGD, colonoscopy for diagnostic and therapeutic intent.  Discussed appropriate timing for each of these procedures.  Not acutely bleeding to require  emergent procedure today, especially in light of active NSTEMI, and therefore favor cardiac stabilization before sedation/endoscopy. - Plan for EGD for diagnostic and therapeutic during this admission.  Unless clinical situation should change, this can likely be done on 12/26 - Discussed colonoscopy at the same time due to IDA, and he adamantly declines having colonoscopy at this juncture - Transfusing 2 units RBCs now - Posttransfusion CBC check and serial CBCs with additional blood products as needed per protocol - Seen by the Cardiology service.  Holding Plavix.  ASA being continued - Was started on heparin gtt on admission, but now held due to H/H downtrend - Clear liquids okay for now with n.p.o. at midnight in case we need to do endoscopy tomorrow - Continue high-dose IV PPI - Cautioned him to avoid all other NSAIDs  2) NSTEMI 3) Chest pain 4) PVD 5) HTN - Management per primary Hospitalist service and consulting Cardiology service  6) COPD - Management per primary Hospitalist service   [place], [place], [place] [phone] office           Consultation  Referring Provider:  Dr. Lorin Mercy    Primary Care Physician:  [name], MD Primary Gastroenterologist: [facility]        Reason for Consultation: Anemia             HPI:   Jimmy Duran is a 66 y.o. male with a history of nonobstructive CAD, hypertension, hyperlipidemia, diabetes type 2, PAD status post stenting of SMA 07/2020 and CAD status post right CEA 2021 (echo 12/11/2018 with EF 60-65%), who  presented to the ER on 02/23/2022 with chest pain and mildly elevated troponin.  We are consulted in regards to anemia.    At time of presentation patient complained of 2 episodes of substernal chest discomfort, apparently had been experiencing intermittent chest discomfort for the past month, yesterday while sweeping/mopping reported 15 minutes of chest discomfort, described as sharp.  He came to the ER.  EKG showed  mild sinus tachycardia with minimal ST depression in the lateral leads, serial troponin 28--> 74, hemoglobin 8.4 (lower than 3 weeks ago when it was 10.1).  Apparently discussing slightly darker stools than normal recently.    Today, at time of my interview patient tells me he was unaware of a diagnosis of anemia but has been to get 3 iron infusions in October.  Tells me he has not really been seeing any blood from anywhere but did have 2 "massive nosebleeds", about a month ago.  Other than that has not noticed any bleeding.  Tells me his stools are very dark brown but not black in any means.  Denies any other overt GI symptoms, apparently on medicine for heartburn and reflux and has no breakthrough.  Did have an episode of vomiting yesterday with his chest pain and remains somewhat nauseous today but otherwise none of the symptoms previously.    Denies fever, chills, abdominal pain, change in bowel habits or symptoms that awaken him from sleep.  GI history: 07/12/2021 EGD with abnormal esophagus, query esophageal lichen planus, status post dilation and biopsy with gastric erosions, normal duodenal bulb/second portion of the duodenum 12/08/2017 colonoscopy with diverticulosis in the sigmoid and descending colon, 1 polyp in the transverse colon  Past Medical History:  Diagnosis Date   AAA (abdominal aortic aneurysm) (HCC)    Arthritis    Carotid artery disease (HCC)    Colon polyp    COPD (chronic obstructive pulmonary disease) (HCC)    Coronary artery disease    Nonobstructive   Essential hypertension    GERD (gastroesophageal reflux disease)    Hemorrhoids    History of TIA (transient ischemic attack)    Low back pain    Lumbar radiculopathy    Mixed hyperlipidemia    Palpitations    Peripheral vascular disease ([place])    Shingles 09/23/2019   Tobacco dependence    Type 2 diabetes mellitus ([place])     Past Surgical History:  Procedure Laterality Date   APPENDECTOMY  1970   BIOPSY   12/08/2017   Procedure: BIOPSY;  Surgeon: [name], MD;  [facility];  Service: Endoscopy;;  esophagus    BIOPSY  12/28/2020   Procedure: BIOPSY;  Surgeon: [name], MD;  [facility];  Service: Endoscopy;;   BIOPSY  07/12/2021   Procedure: BIOPSY;  Surgeon: [name], MD;  [facility];  Service: Endoscopy;;   BREAST SURGERY Right    Cyst resection on the right   CARDIAC CATHETERIZATION  1990's   in [place]. no stent placement   CATARACT EXTRACTION W/PHACO Right 06/18/2016   Procedure: CATARACT EXTRACTION PHACO AND INTRAOCULAR LENS PLACEMENT ([place]);  Surgeon: [name], MD;  Location: [facility];  Service: Ophthalmology;  Laterality: Right;  [name] 35.7 AP% 16.6 CDE 5.94 Fluid pack lot # [id number]   CATARACT EXTRACTION W/PHACO Left 07/16/2016   Procedure: CATARACT EXTRACTION PHACO AND INTRAOCULAR LENS PLACEMENT ([facility]);  Surgeon: [name], MD;  Location: [facility];  Service: Ophthalmology;  Laterality: Left;  [name] 51.8  AP% 12.7 CDE 6.58 Fluid Pack Lot # [id number]   CHOLECYSTECTOMY  1993   COLONOSCOPY  02/12/2007   LGX:[id number] friable anal canal hemorrhoids, otherwise normal rectum and colon   COLONOSCOPY N/A 07/16/2012   RMR: Colonic diverticulosis. 4 mm tubular adenoma   COLONOSCOPY WITH PROPOFOL N/A 12/08/2017   Dr. Gala Romney: sigmoid and descending colon diverticulosis, transverse colon polyp (TUBULAR ADENOMA)   ENDARTERECTOMY Right 10/08/2019   Procedure: RIGHT CAROTID ENDARTERECTOMY;  Surgeon: Rosetta Posner, MD;  Location: [facility];  Service: Vascular;  Laterality: Right;   ESOPHAGOGASTRODUODENOSCOPY (EGD) WITH ESOPHAGEAL DILATION N/A 07/16/2012   RMR: +Candida esophagitis, Erosive reflux esophagiits. Schatizi's ring status post dilation. Hiatal hernia. Antral erosions status post biopsy.    ESOPHAGOGASTRODUODENOSCOPY (EGD) WITH PROPOFOL N/A 12/08/2017   Dr. Gala Romney: esophagitis, query EOE but negative for increased  eosinophils on path, small hiatal hernia, normal stomach, normal duodenum, PAS stain with rare yeast forms on stain. Empiric dilation   ESOPHAGOGASTRODUODENOSCOPY (EGD) WITH PROPOFOL N/A 12/28/2020   esophagitis without bleeding, s/p biopsy and dilation. Antral erosions s/p biopsy.   ESOPHAGOGASTRODUODENOSCOPY (EGD) WITH PROPOFOL N/A 07/12/2021   Surgeon: [name], MD;  Cobblestoning appearance of distal esophagus, slight narrowing of distal esophagus s/p dilation with 49 Fr and biopsy, gastric erosions biopsied, normal examined duodenum.  Esophageal biopsy with reactive squamous mucosa, gastric biopsy with reactive gastropathy, negative for H. pylori.   EYE SURGERY Bilateral 2018   cataract   HERNIA REPAIR     JOINT REPLACEMENT     KNEE SURGERY     Left knee arthroscopy torn medial meniscus grade 4 chondral changes medial femoral condyle tibial plateau   MALONEY DILATION N/A 12/08/2017   Procedure: MALONEY DILATION;  Surgeon: [name], MD;  [facility];  Service: Endoscopy;  Laterality: N/A;   MALONEY DILATION N/A 12/28/2020   Procedure: Venia Minks DILATION;  Surgeon: [name], MD;  [facility];  Service: Endoscopy;  Laterality: N/A;   MALONEY DILATION N/A 07/12/2021   Procedure: Venia Minks DILATION;  Surgeon: [name], MD;  [facility];  Service: Endoscopy;  Laterality: N/A;   PATCH ANGIOPLASTY Right 10/08/2019   Procedure: PATCH ANGIOPLASTY of the right common carotid artery using hemashield plaltinum finesse patch;  Surgeon: Rosetta Posner, MD;  Location: [place] [facility];  Service: Vascular;  Laterality: Right;   PERIPHERAL VASCULAR INTERVENTION  07/17/2020   Procedure: PERIPHERAL VASCULAR INTERVENTION;  Surgeon: Waynetta Sandy, MD;  Location: [facility] [facility];  Service: Cardiovascular;;   POLYPECTOMY  12/08/2017   Procedure: POLYPECTOMY;  Surgeon: [name], MD;  [facility];  Service: Endoscopy;;  colon   TOTAL  KNEE ARTHROPLASTY Left 04/27/2012   Procedure: TOTAL KNEE ARTHROPLASTY;  Surgeon: Carole Civil, MD;  Location: [facility];  Service: Orthopedics;  Laterality: Left;   TRANSCAROTID ARTERY REVASCULARIZATION  Left 10/12/2021   Procedure: Left Transcarotid Artery Revascularization;  Surgeon: Waynetta Sandy, MD;  Location: [facility];  Service: Vascular;  Laterality: Left;   [place] N/A 07/17/2020   Procedure: mesenteric ANGIOGRAPHY;  Surgeon: Waynetta Sandy, MD;  Location: [place] [facility];  Service: Cardiovascular;  Laterality: N/A;    Family History  Problem Relation Age of Onset   Dementia Mother    Hypertension Mother    Colon polyps Mother    Osteoporosis Mother    Cancer Father    Hypertension Father    Coronary  artery disease Father        CABG in his 13's   Heart attack Father    Cancer - Lung Father    Heart disease Brother        has a pacemaker   Emphysema Maternal Grandmother    Cancer - Lung Maternal Grandfather    Cancer Paternal Grandmother    Heart attack Paternal Grandfather    Colon cancer Other        maternal great aunt   Colon cancer Other        paternal great aunt    Social History   Tobacco Use   Smoking status: Every Day    Packs/day: 1.00    Years: 53.00    Total pack years: 53.00    Types: Cigarettes   Smokeless tobacco: Former    Types: Chew   Tobacco comments:    0.5 pk per day currently   Vaping Use   Vaping Use: Former  Substance Use Topics   Alcohol use: Yes    Comment: socially "beer and tomato juice"    Drug use: No    Prior to Admission medications   Medication Sig Start Date End Date Taking? Authorizing Provider  albuterol (VENTOLIN HFA) 108 (90 Base) MCG/ACT inhaler Inhale 2 puffs into the lungs every 6 (six) hours as needed for wheezing or shortness of breath. 08/09/21  Yes Paseda, Dewaine Conger, FNP  diphenhydrAMINE (BENADRYL) 25 MG tablet Take 25 mg by mouth daily  as needed for allergies.    Yes [provider]  aspirin EC 81 MG tablet Take 1 tablet (81 mg total) by mouth daily at 6 (six) AM. Swallow whole. 10/14/21   Rhyne, Hulen Shouts, PA-C  clopidogrel (PLAVIX) 75 MG tablet Take 1 tablet (75 mg total) by mouth daily. 08/09/21 08/09/22  PasedaDewaine Conger, FNP  Continuous Blood Gluc Sensor (FREESTYLE LIBRE 3 SENSOR) MISC 1 each by Does not apply route every 14 (fourteen) days. Place 1 sensor on the skin every 14 days. Use to check glucose continuously 12/19/21   [name], MD  empagliflozin (JARDIANCE) 25 MG TABS tablet Take 1 tablet (25 mg total) by mouth daily before breakfast. 01/08/22 04/08/22  [name], MD  famotidine (PEPCID) 20 MG tablet Take 1 tablet (20 mg total) by mouth 2 (two) times daily. 05/08/21   Annitta Needs, NP  Glucagon (GVOKE HYPOPEN 2-PACK) 1 MG/0.2ML SOAJ Inject 1 mg into the skin as needed (hypoglycemia). 12/11/21   [name], MD  insulin glargine (LANTUS) 100 UNIT/ML injection Inject 0.18 mLs (18 Units total) into the skin daily. 12/25/21   [name], MD  lansoprazole (PREVACID) 30 MG capsule Take 1 capsule (30 mg total) by mouth 2 (two) times daily before a meal. 10/19/21   Erenest Rasher, PA-C  metFORMIN (GLUCOPHAGE) 1000 MG tablet Take 1 tablet (1,000 mg total) by mouth 2 (two) times daily with a meal. 09/11/21   Paseda, Dewaine Conger, FNP  metoprolol tartrate (LOPRESSOR) 25 MG tablet Take 0.5 tablets (12.5 mg total) by mouth 2 (two) times daily. 10/29/21   Satira Sark, MD  NOVOLOG 100 UNIT/ML injection Inject 14 Units into the skin 3 (three) times daily with meals. 01/08/22   [name], MD  rosuvastatin (CRESTOR) 40 MG tablet Take 1 tablet (40 mg total) by mouth daily. 08/14/21   Satira Sark, MD    Current Facility-Administered Medications  Medication Dose Route Frequency Provider Last Rate Last  Admin   0.9 %  sodium chloride infusion (Manually program via Guardrails IV Fluids)    Intravenous Once Karmen Bongo, MD       0.9 %  sodium chloride infusion  250 mL Intravenous PRN Karmen Bongo, MD       acetaminophen (TYLENOL) tablet 650 mg  650 mg Oral Q4H PRN Karmen Bongo, MD       albuterol (PROVENTIL) (2.5 MG/3ML) 0.083% nebulizer solution 2.5 mg  2.5 mg Nebulization Q2H PRN Karmen Bongo, MD       aspirin EC tablet 81 mg  81 mg Oral Q0600 Karmen Bongo, MD       famotidine (PEPCID) tablet 20 mg  20 mg Oral BID Karmen Bongo, MD   20 mg at 02/24/22 1001   heparin ADULT infusion 100 units/mL (25000 units/254m)  750 Units/hr Intravenous Continuous YKarmen Bongo MD 7.5 mL/hr at 02/24/22 0452 750 Units/hr at 02/24/22 0452   insulin aspart (novoLOG) injection 0-15 Units  0-15 Units Subcutaneous TID [facility] B[facility] MD   3 Units at 02/24/22 01610  insulin aspart (novoLOG) injection 0-5 Units  0-5 Units Subcutaneous QHS YKarmen Bongo MD       insulin glargine-yfgn ([employer] injection 10 Units  10 Units Subcutaneous Daily YKarmen Bongo MD   10 Units at 02/24/22 1018   metoprolol tartrate (LOPRESSOR) tablet 12.5 mg  12.5 mg Oral BID YKarmen Bongo MD   12.5 mg at 02/24/22 1001   nicotine (NICODERM CQ - dosed in mg/24 hours) patch 14 mg  14 mg Transdermal Daily YKarmen Bongo MD   14 mg at 02/24/22 1002   nitroGLYCERIN (NITROSTAT) SL tablet 0.4 mg  0.4 mg Sublingual Q5 Min x 3 PRN YKarmen Bongo MD       ondansetron ([facility] injection 4 mg  4 mg Intravenous Q6H PRN YKarmen Bongo MD       pantoprazole (PROTONIX) EC tablet 40 mg  40 mg Oral Daily YKarmen Bongo MD   40 mg at 02/24/22 1002   rosuvastatin (CRESTOR) tablet 40 mg  40 mg Oral Daily YKarmen Bongo MD   40 mg at 02/24/22 1002   sodium chloride flush (NS) 0.9 % injection 3 mL  3 mL Intravenous QLillia Mountain MD   3 mL at 02/24/22 09604  sodium chloride flush (NS) 0.9 % injection 3 mL  3 mL Intravenous PRN YKarmen Bongo MD       Current Outpatient Medications  Medication Sig  Dispense Refill   albuterol (VENTOLIN HFA) 108 (90 Base) MCG/ACT inhaler Inhale 2 puffs into the lungs every 6 (six) hours as needed for wheezing or shortness of breath. 8 g 2   diphenhydrAMINE (BENADRYL) 25 MG tablet Take 25 mg by mouth daily as needed for allergies.      aspirin EC 81 MG tablet Take 1 tablet (81 mg total) by mouth daily at 6 (six) AM. Swallow whole. 30 tablet 12   clopidogrel (PLAVIX) 75 MG tablet Take 1 tablet (75 mg total) by mouth daily. 30 tablet 11   Continuous Blood Gluc Sensor (FREESTYLE LIBRE 3 SENSOR) MISC 1 each by Does not apply route every 14 (fourteen) days. Place 1 sensor on the skin every 14 days. Use to check glucose continuously 2 each 2   empagliflozin (JARDIANCE) 25 MG TABS tablet Take 1 tablet (25 mg total) by mouth daily before breakfast. 30 tablet 2   famotidine (PEPCID) 20 MG tablet Take 1 tablet (20 mg total) by mouth 2 (two) times  daily. 60 tablet 3   Glucagon (GVOKE HYPOPEN 2-PACK) 1 MG/0.2ML SOAJ Inject 1 mg into the skin as needed (hypoglycemia). 0.2 mL 1   insulin glargine (LANTUS) 100 UNIT/ML injection Inject 0.18 mLs (18 Units total) into the skin daily. 10 mL PRN   lansoprazole (PREVACID) 30 MG capsule Take 1 capsule (30 mg total) by mouth 2 (two) times daily before a meal. 60 capsule 3   metFORMIN (GLUCOPHAGE) 1000 MG tablet Take 1 tablet (1,000 mg total) by mouth 2 (two) times daily with a meal. 60 tablet 3   metoprolol tartrate (LOPRESSOR) 25 MG tablet Take 0.5 tablets (12.5 mg total) by mouth 2 (two) times daily.     NOVOLOG 100 UNIT/ML injection Inject 14 Units into the skin 3 (three) times daily with meals. 10 mL 11   rosuvastatin (CRESTOR) 40 MG tablet Take 1 tablet (40 mg total) by mouth daily. 90 tablet 3    Allergies as of 02/23/2022   (No Known Allergies)     Review of Systems:    Constitutional: No weight loss, fever or chills Skin: No rash Cardiovascular: See HPI Respiratory: No SOB  Gastrointestinal: See HPI and otherwise  negative Genitourinary: No dysuria  Neurological: No headache, dizziness or syncope Musculoskeletal: No new muscle or joint pain Hematologic: No bruising Psychiatric: No history of depression or anxiety    Physical Exam:  Vital signs in last 24 hours: Temp:  [98.1 F (36.7 C)-98.8 F (37.1 C)] 98.1 F (36.7 C) (12/24 0715) Pulse Rate:  [92-123] 92 (12/24 0715) Resp:  [16-23] 17 (12/24 0715) BP: (126-155)/(77-97) 147/92 (12/24 0715) SpO2:  [88 %-98 %] 97 % (12/24 0715) Weight:  [63.7 kg] 63.7 kg (12/24 0322)   General:   Pleasant Caucasian male appears to be in NAD, Well developed, Well nourished, alert and cooperative Head:  Normocephalic and atraumatic. Eyes:   PEERL, EOMI. No icterus. Conjunctiva pink. Ears:  Normal auditory acuity. Neck:  Supple Throat: Oral cavity and pharynx without inflammation, swelling or lesion.  Lungs: Respirations even and unlabored. Lungs clear to auscultation bilaterally.   No wheezes, crackles, or rhonchi.  Heart: Normal S1, S2. No MRG. Regular rate and rhythm. No peripheral edema, cyanosis or pallor.  Abdomen:  Soft, nondistended, nontender. No rebound or guarding. Normal bowel sounds. No appreciable masses or hepatomegaly. Rectal:  Not performed.  Msk:  Symmetrical without gross deformities. Peripheral pulses intact.  Extremities:  Without edema, no deformity or joint abnormality. Neurologic:  Alert and  oriented x4;  grossly normal neurologically. Skin:   Dry and intact without significant lesions or rashes. Psychiatric: Demonstrates good judgement and reason without abnormal affect or behaviors.   LAB RESULTS: Recent Labs    02/23/22 2257 02/24/22 0800  WBC 7.2 6.4  HGB 8.4* 7.8*  HCT 27.3* 25.2*  PLT 409* 390   BMET Recent Labs    02/23/22 2257  NA 138  K 3.7  CL 104  CO2 23  GLUCOSE 327*  BUN 13  CREATININE 1.20  CALCIUM 8.9   STUDIES: DG Chest Port 1 View  Result Date: 02/23/2022 CLINICAL DATA:  Midsternal chest pain  with nausea and vomiting EXAM: PORTABLE CHEST 1 VIEW COMPARISON:  CT chest 09/06/2021 FINDINGS: Normal cardiomediastinal silhouette. Aortic atherosclerotic calcification. No focal consolidation, pleural effusion, or pneumothorax. No acute osseous abnormality. Electronic device projects over the left axilla. Partially visualized vascular stent in the left neck. IMPRESSION: No active disease. Electronically Signed   By: Placido Sou M.D.   On:  02/23/2022 23:18     Impression / Plan:   Impression: 1.  Anemia: Has received 3 rounds of iron infusion in October by PCP, hemoglobin 10.9 on 10/10--> 10.1 on 11/28--> 8.4 on 12/23--> 7.8 overnight, fecal occult positive, does describe some large nosebleeds about a month ago but otherwise not seeing any blood, no change in bowel habits, no abdominal pain; or GI source versus other 2.  Unintentional weight loss: Reports around 40 pounds over the last year 3.  NSTEMI: 2 episodes of substernal sharp chest discomfort on 12/23, EKG with minimal ST depression 4.  CAD: Remote cardiac cath with nonobstructive disease 5.  Hypertension 6.  PAD: Underwent SMA stenting in May 2022, on Aspirin and Plavix  Plan: 1.  Per cardiology patient needs evaluation for anemia first prior to cardiac cath 2.  Heparin being started per hospitalist today 3.  Continue to monitor hemoglobin and transfusion as needed.  Agree with 1 units PRBCs now 4.  Patient would benefit from likely repeat EGD and colonoscopy, timing per Dr. Bryan Lemma.  Did discuss this with the patient today.  He is not happy that he will be here through Christmas and tells me he "may not stick around if things take too long because he has a family too". 5.  Will keep NPO for now- can be changed per timing of procedures 6.  Heparin will need to be held 6 hours prior to time procedure when scheduled  Thank you for your kind consultation, we will continue to follow.  Lavone [facility] [employer] [employer]  02/24/2022, 10:19 AM

## 2022-02-24 NOTE — ED Notes (Signed)
Trop 150. MD Lorin Mercy made aware via secure chat.

## 2022-02-24 NOTE — H&P (Signed)
History and Physical    Patient: Jimmy Duran DOB: [birth year] DOA: 02/23/2022 DOS: the patient was seen and examined on 02/24/2022 PCP: [name], MD  Patient coming from: Home - lives with wife and 2 sons; NOK: [place], [place], [phone]   Chief Complaint: Chest pain  HPI: Jimmy Duran is a 66 y.o. 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  He reports that he has been struggling with light-headedness and dizziness ever since his carotid stent placement a couple of months ago (10/2021).  He was taken off lisinopril with some improvement in symptoms but they have still be ongoing.  He was seen by cardiology last week and had a Zio patch placed.  In the meantime, yesterday he developed acute onset of substernal CP while he was sweeping and mopping the floor; it resolved with rest.  The pain then recurred while he was resting and lasted about 20 minutes, prompting them to call 911.  He has not had further recurrence.    He has had some DOE and occasional dark stools.  He gets colonoscopies q5 years and is due for his next one.  He also received 3 recent IV iron infusions.  In review of notes, he was noted to have fatigue, 40 pound unintentional weight loss, and iron deficiency in October by his PCP; he was unable to tolerate PO iron due to GI side effects and so was given IV iron infusions on 11/14, 11/21, 11/28, and 12/7.  His last EGD was on 07/12/21 and showed a cobblestoning appearance of the distal esophagus with slight esophageal narrowing s/p dilation; biopsies were negative for malignancy or H pylori.    ER Course:  Carryover, per Dr. Alcario Drought:  Pt with NSTEMI, Dr. Radford Pax aware, wants medicine admit.  H/o non-occlusive CAD. Has carotid dz with stents. Has had 2 episodes of CP with pain on exertion yesterday and pain at  rest last night.  Has ST depressions and mild trop elevations.      Review of Systems: As mentioned in the history of present illness. All other systems reviewed and are negative. Past Medical History:  Diagnosis Date   AAA (abdominal aortic aneurysm) (HCC)    Arthritis    Carotid artery disease (HCC)    Colon polyp    COPD (chronic obstructive pulmonary disease) (HCC)    Coronary artery disease    Nonobstructive   Essential hypertension    GERD (gastroesophageal reflux disease)    Hemorrhoids    History of TIA (transient ischemic attack)    Low back pain    Lumbar radiculopathy    Mixed hyperlipidemia    Palpitations    Peripheral vascular disease ([place])    Shingles 09/23/2019   Tobacco dependence    Type 2 diabetes mellitus ([place])    Past Surgical History:  Procedure Laterality Date   APPENDECTOMY  1970   BIOPSY  12/08/2017   Procedure: BIOPSY;  Surgeon: [name], MD;  [facility];  Service: Endoscopy;;  esophagus    BIOPSY  12/28/2020   Procedure: BIOPSY;  Surgeon: [name], MD;  [facility];  Service: Endoscopy;;   BIOPSY  07/12/2021   Procedure: BIOPSY;  Surgeon: [name], MD;  [facility];  Service: Endoscopy;;   BREAST SURGERY Right    Cyst resection on the right  CARDIAC CATHETERIZATION  1990's   in [place]. no stent placement   CATARACT EXTRACTION W/PHACO Right 06/18/2016   Procedure: CATARACT EXTRACTION PHACO AND INTRAOCULAR LENS PLACEMENT (IOC);  Surgeon: [name], MD;  Location: [facility];  Service: Ophthalmology;  Laterality: Right;  [name] 35.7 AP% 16.6 CDE 5.94 Fluid pack lot # [id number]   CATARACT EXTRACTION W/PHACO Left 07/16/2016   Procedure: CATARACT EXTRACTION PHACO AND INTRAOCULAR LENS PLACEMENT (IOC);  Surgeon: [name], MD;  Location: [facility];  Service: Ophthalmology;  Laterality: Left;  [name] 51.8 AP% 12.7 CDE 6.58 Fluid Pack Lot # [id number]   CHOLECYSTECTOMY  1993    COLONOSCOPY  02/12/2007   GEX:[id number] friable anal canal hemorrhoids, otherwise normal rectum and colon   COLONOSCOPY N/A 07/16/2012   RMR: Colonic diverticulosis. 4 mm tubular adenoma   COLONOSCOPY WITH PROPOFOL N/A 12/08/2017   Dr. Gala Romney: sigmoid and descending colon diverticulosis, transverse colon polyp (TUBULAR ADENOMA)   ENDARTERECTOMY Right 10/08/2019   Procedure: RIGHT CAROTID ENDARTERECTOMY;  Surgeon: Rosetta Posner, MD;  Location: [facility];  Service: Vascular;  Laterality: Right;   ESOPHAGOGASTRODUODENOSCOPY (EGD) WITH ESOPHAGEAL DILATION N/A 07/16/2012   RMR: +Candida esophagitis, Erosive reflux esophagiits. Schatizi's ring status post dilation. Hiatal hernia. Antral erosions status post biopsy.    ESOPHAGOGASTRODUODENOSCOPY (EGD) WITH PROPOFOL N/A 12/08/2017   Dr. Gala Romney: esophagitis, query EOE but negative for increased eosinophils on path, small hiatal hernia, normal stomach, normal duodenum, PAS stain with rare yeast forms on stain. Empiric dilation   ESOPHAGOGASTRODUODENOSCOPY (EGD) WITH PROPOFOL N/A 12/28/2020   esophagitis without bleeding, s/p biopsy and dilation. Antral erosions s/p biopsy.   ESOPHAGOGASTRODUODENOSCOPY (EGD) WITH PROPOFOL N/A 07/12/2021   Surgeon: [name], MD;  Cobblestoning appearance of distal esophagus, slight narrowing of distal esophagus s/p dilation with 17 Fr and biopsy, gastric erosions biopsied, normal examined duodenum.  Esophageal biopsy with reactive squamous mucosa, gastric biopsy with reactive gastropathy, negative for H. pylori.   EYE SURGERY Bilateral 2018   cataract   HERNIA REPAIR     JOINT REPLACEMENT     KNEE SURGERY     Left knee arthroscopy torn medial meniscus grade 4 chondral changes medial femoral condyle tibial plateau   MALONEY DILATION N/A 12/08/2017   Procedure: MALONEY DILATION;  Surgeon: [name], MD;  [facility];  Service: Endoscopy;  Laterality: N/A;   MALONEY DILATION N/A 12/28/2020   Procedure:  Venia Minks DILATION;  Surgeon: [name], MD;  [facility];  Service: Endoscopy;  Laterality: N/A;   MALONEY DILATION N/A 07/12/2021   Procedure: Venia Minks DILATION;  Surgeon: [name], MD;  [facility];  Service: Endoscopy;  Laterality: N/A;   PATCH ANGIOPLASTY Right 10/08/2019   Procedure: PATCH ANGIOPLASTY of the right common carotid artery using hemashield plaltinum finesse patch;  Surgeon: Rosetta Posner, MD;  Location: [place] [facility];  Service: Vascular;  Laterality: Right;   PERIPHERAL VASCULAR INTERVENTION  07/17/2020   Procedure: PERIPHERAL VASCULAR INTERVENTION;  Surgeon: Waynetta Sandy, MD;  Location: [place] [facility];  Service: Cardiovascular;;   POLYPECTOMY  12/08/2017   Procedure: POLYPECTOMY;  Surgeon: [name], MD;  [facility];  Service: Endoscopy;;  colon   TOTAL KNEE ARTHROPLASTY Left 04/27/2012   Procedure: TOTAL KNEE ARTHROPLASTY;  Surgeon: Carole Civil, MD;  Location: [facility];  Service: Orthopedics;  Laterality: Left;   TRANSCAROTID ARTERY REVASCULARIZATION  Left 10/12/2021   Procedure: Left Transcarotid Artery Revascularization;  Surgeon: Servando Snare  Harrell Gave, MD;  Location: [facility];  Service: Vascular;  Laterality: Left;   [place] N/A 07/17/2020   Procedure: mesenteric ANGIOGRAPHY;  Surgeon: Waynetta Sandy, MD;  Location: [facility];  Service: Cardiovascular;  Laterality: N/A;   Social History:  reports that he has been smoking cigarettes. He has a 53.00 pack-year smoking history. He has quit using smokeless tobacco.  His smokeless tobacco use included chew. He reports current alcohol use. He reports that he does not use drugs.  No Known Allergies  Family History  Problem Relation Age of Onset   Dementia Mother    Hypertension Mother    Colon polyps Mother    Osteoporosis Mother    Cancer Father    Hypertension Father    Coronary artery  disease Father        CABG in his 35's   Heart attack Father    Cancer - Lung Father    Heart disease Brother        has a pacemaker   Emphysema Maternal Grandmother    Cancer - Lung Maternal Grandfather    Cancer Paternal Grandmother    Heart attack Paternal Grandfather    Colon cancer Other        maternal great aunt   Colon cancer Other        paternal great aunt    Prior to Admission medications   Medication Sig Start Date End Date Taking? Authorizing Provider  albuterol (VENTOLIN HFA) 108 (90 Base) MCG/ACT inhaler Inhale 2 puffs into the lungs every 6 (six) hours as needed for wheezing or shortness of breath. 08/09/21   Renee Rival, FNP  aspirin EC 81 MG tablet Take 1 tablet (81 mg total) by mouth daily at 6 (six) AM. Swallow whole. 10/14/21   Rhyne, Hulen Shouts, PA-C  clopidogrel (PLAVIX) 75 MG tablet Take 1 tablet (75 mg total) by mouth daily. 08/09/21 08/09/22  PasedaDewaine Conger, FNP  Continuous Blood Gluc Sensor (FREESTYLE LIBRE 3 SENSOR) MISC 1 each by Does not apply route every 14 (fourteen) days. Place 1 sensor on the skin every 14 days. Use to check glucose continuously 12/19/21   [name], MD  diphenhydrAMINE (BENADRYL) 25 MG tablet Take 25 mg by mouth daily as needed for allergies.     [provider]  empagliflozin (JARDIANCE) 25 MG TABS tablet Take 1 tablet (25 mg total) by mouth daily before breakfast. 01/08/22 04/08/22  [name], MD  famotidine (PEPCID) 20 MG tablet Take 1 tablet (20 mg total) by mouth 2 (two) times daily. 05/08/21   Annitta Needs, NP  Glucagon (GVOKE HYPOPEN 2-PACK) 1 MG/0.2ML SOAJ Inject 1 mg into the skin as needed (hypoglycemia). 12/11/21   [name], MD  insulin glargine (LANTUS) 100 UNIT/ML injection Inject 0.18 mLs (18 Units total) into the skin daily. 12/25/21   [name], MD  lansoprazole (PREVACID) 30 MG capsule Take 1 capsule (30 mg total) by mouth 2 (two) times daily before a meal. 10/19/21   Erenest Rasher, PA-C  metFORMIN (GLUCOPHAGE) 1000 MG tablet Take 1 tablet (1,000 mg total) by mouth 2 (two) times daily with a meal. 09/11/21   Paseda, Dewaine Conger, FNP  metoprolol tartrate (LOPRESSOR) 25 MG tablet Take 0.5 tablets (12.5 mg total) by mouth 2 (two) times daily. 10/29/21   Satira Sark, MD  NOVOLOG 100 UNIT/ML injection Inject 14 Units into the skin 3 (  three) times daily with meals. 01/08/22   [name], MD  rosuvastatin (CRESTOR) 40 MG tablet Take 1 tablet (40 mg total) by mouth daily. 08/14/21   Satira Sark, MD  tirzepatide [facility]) 2.5 MG/0.5ML Pen Inject 2.5 mg into the skin once a week for 4 doses. Patient not taking: Reported on 02/21/2022 02/05/22 02/27/22  [name], MD    Physical Exam: Vitals:   02/24/22 0322 02/24/22 0346 02/24/22 0457 02/24/22 0715  BP:  (!) 153/82 136/81 (!) 147/92  Pulse:  96 96 92  Resp:  '16 19 17  '$ Temp:  98.2 F (36.8 C) 98.3 F (36.8 C) 98.1 F (36.7 C)  TempSrc:  Oral Oral Oral  SpO2:  96% 98% 97%  Weight: 63.7 kg     Height: '5\' 7"'$  (1.702 m)      General:  Appears calm and comfortable and is in NAD Eyes:  EOMI, normal lids, iris ENT:  grossly normal hearing, lips & tongue, mmm; edentulous Neck:  no LAD, masses or thyromegaly Cardiovascular:  RRR, no m/r/g. No LE edema.  Respiratory:   CTA bilaterally with no wheezes/rales/rhonchi.  Normal respiratory effort. Abdomen:  soft, NT, ND Skin:  no rash or induration seen on limited exam Musculoskeletal:  grossly normal tone BUE/BLE, good ROM, no bony abnormality Psychiatric:  grossly normal mood and affect, speech fluent and appropriate, AOx3 Neurologic:  CN 2-12 grossly intact, moves all extremities in coordinated fashion   Radiological Exams on Admission: Independently reviewed - see discussion in A/P where applicable  DG Chest Port 1 View  Result Date: 02/23/2022 CLINICAL DATA:  Midsternal chest pain with nausea and vomiting EXAM: PORTABLE CHEST 1 VIEW COMPARISON:  CT  chest 09/06/2021 FINDINGS: Normal cardiomediastinal silhouette. Aortic atherosclerotic calcification. No focal consolidation, pleural effusion, or pneumothorax. No acute osseous abnormality. Electronic device projects over the left axilla. Partially visualized vascular stent in the left neck. IMPRESSION: No active disease. Electronically Signed   By: Placido Sou M.D.   On: 02/23/2022 23:18    EKG: Independently reviewed.  Sinus tachycardia with rate 119; mild ST depression in anterolateral leads   Labs on Admission: I have personally reviewed the available labs and imaging studies at the time of the admission.  Pertinent labs:    Glucose 327 HS troponin 28, 74 WBC 7.2 Hgb 8.4; 10.1 on 11/28   Assessment and Plan: Principal Problem:   NSTEMI (non-ST elevated myocardial infarction) [facility]) Active Problems:   Peripheral vascular disease (HCC)    NSTEMI -Patient with substernal chest pain that came on acutely with exertion yesterday and improved with rest and then recurred while at rest. -3/3 typical symptoms suggestive of typical angina.   -CXR unremarkable.   -Initial HS troponin minimally elevated; repeat with positive delta suggestive of NSTEMI.  Third troponin is currently pending.   -EKG with apparent anterolateral ST depression of uncertain duration, no apparent STEMI. -Will admit since the patient has positive troponins and/or an abnormal EKG with angina necessitating acute intervention. -Will admit to progressive care given concern for both cardiac and GI issues at this time. -Repeat EKG prn -Risk factor stratification with FLP; will also check UDS -Cardiology consultation requested -Patient appears likely to need invasive evaluation (cardiac catheterization) based on concerning history, ischemic EKG, elevated troponin; however, this is question of GI bleed and if this is present cath will need to be delayed (see below)  -Continue ASA 81 mg daily; hold Plavix for now while on  Heparin -He was  started on heparin infusion per pharmacy -NTG for symptom relief (although there is no mortality benefit) -Continue beta blocker  -Will add CCB if ischemia persists despite max BB therapy -Supplemental O2  Anemia -Patient with long-standing h/o light-headedness and DOE -He has also had iron deficiency anemia and has not tolerated oral iron; he was recently given 4 doses of IV iron -Despite IV iron, his Hgb has been downtrending - 14.2 on 8/3 -> 12 on 8/11 -> 11.3 on 8/12 -> 11.1 on 8/26 -> 10.9 on 10/10 -> 10.1 on 11/28 -> 8.4 today -Will repeat CBC now and in AM daily -Check guaiac -If positive, can carefully continue heparin for now but will need GI consult and to defer cath at this time -He is on BID PPI and H2 blocker  PVD -He has had stenting of multiple peripheral vessels (SMA, carotids) -Needs to resume ASA, Plavix upon discontinuation of heparin or as soon as appropriate  HTN -Continue letoprolol -Will also add prn hydralazine  HLD -Continue rosuvastatin -Recheck lipids  DM -Last A1c was 9.6, indicating poor control -Hold Jardiance, metformin -Continue glargine but decrease to 10 units for now (patient is NPO) -Will cover with moderate-scale SSI for now  COPD with ongoing tobacco dependence -Continue Albuterol prn -Tobacco Dependence: encourage cessation.   -This was discussed with the patient and should be reviewed on an ongoing basis.   -Patch ordered       Advance Care Planning:   Code Status: Full Code  - Code status was discussed with the patient at the time of admission.  The patient would want to receive full resuscitative measures at this time.   Consults: Cardiology  DVT Prophylaxis: Heparin drip  Family Communication: None present; I spoke with his wife at the time of admission  Severity of Illness: The appropriate patient status for this patient is INPATIENT. Inpatient status is judged to be reasonable and necessary in order to  provide the required intensity of service to ensure the patient's safety. The patient's presenting symptoms, physical exam findings, and initial radiographic and laboratory data in the context of their chronic comorbidities is felt to place them at high risk for further clinical deterioration. Furthermore, it is not anticipated that the patient will be medically stable for discharge from the hospital within 2 midnights of admission.   * I certify that at the point of admission it is my clinical judgment that the patient will require inpatient hospital care spanning beyond 2 midnights from the point of admission due to high intensity of service, high risk for further deterioration and high frequency of surveillance required.*   Author: Karmen Bongo, MD 02/24/2022 8:05 AM  For on call review [email or web address].

## 2022-02-24 NOTE — ED Notes (Addendum)
Pt called nurse saying he had to use bathroom, pt insisted on walking, this nurse walked with pt, pt got out of bed and stood up quickly, pt dizzy and wobbly when walking to bathroom, explained to pt this is why I didn't want him walking, pt verbalized understanding, pt used bathroom while nurse waited just outside of door, pt walked back to room with this nurse by side, pt more steady walking at this point, pt agreed to sit on side of bed and use urinal next time. Dr Sedonia Small made aware

## 2022-02-24 NOTE — Progress Notes (Signed)
ANTICOAGULATION CONSULT NOTE - Initial Consult  Pharmacy Consult for heparin Indication: chest pain/ACS  No Known Allergies  Patient Measurements: Height: '5\' 7"'$  (170.2 cm) Weight: 63.7 kg (140 lb 6.9 oz) IBW/kg (Calculated) : 66.1  Vital Signs: Temp: 98.8 F (37.1 C) (12/24 0030) Temp Source: Oral (12/24 0030) BP: 126/77 (12/24 0300) Pulse Rate: 93 (12/24 0320)  Labs: Recent Labs    02/23/22 2257 02/24/22 0159  HGB 8.4*  --   HCT 27.3*  --   PLT 409*  --   CREATININE 1.20  --   TROPONINIHS 28* 74*    Estimated Creatinine Clearance: 56 mL/min (by C-G formula based on SCr of 1.2 mg/dL).   Medical History: Past Medical History:  Diagnosis Date   AAA (abdominal aortic aneurysm) (HCC)    Arthritis    Carotid artery disease (HCC)    Colon polyp    COPD (chronic obstructive pulmonary disease) (HCC)    Coronary artery disease    Nonobstructive   Essential hypertension    GERD (gastroesophageal reflux disease)    Hemorrhoids    History of TIA (transient ischemic attack)    Low back pain    Lumbar radiculopathy    Mixed hyperlipidemia    Palpitations    Peripheral vascular disease ([place])    Shingles 09/23/2019   Type 2 diabetes mellitus (HCC)     Assessment: 66yo male c/o intermittent mid-sternal CP associated w/ N/V, one episode of CP radiated to left armpit, ECG concerning for STEMI >> plan to start heparin and transfer to [facility] for further evaluation.   Goal of Therapy:  Heparin level 0.3-0.7 units/ml Monitor platelets by anticoagulation protocol: Yes   Plan:  Heparin 4000 units IV bolus x1 followed by infusion at 750 units/hr. Monitor heparin levels and CBC.  Wynona Neat, PharmD, BCPS  02/24/2022,3:46 AM

## 2022-02-25 DIAGNOSIS — I214 Non-ST elevation (NSTEMI) myocardial infarction: Secondary | ICD-10-CM | POA: Diagnosis not present

## 2022-02-25 LAB — LIPID PANEL
Cholesterol: 92 mg/dL (ref 0–200)
HDL: 37 mg/dL — ABNORMAL LOW (ref >40)
LDL Cholesterol: 9 mg/dL (ref 0–99)
Total CHOL/HDL Ratio: 2.5 RATIO
Triglycerides: 230 mg/dL — ABNORMAL HIGH (ref <150)
VLDL: 46 mg/dL — ABNORMAL HIGH (ref 0–40)

## 2022-02-25 LAB — TYPE AND SCREEN
ABO/RH(D): O POS
Antibody Screen: NEGATIVE
Unit division: 0
Unit division: 0

## 2022-02-25 LAB — BASIC METABOLIC PANEL
Anion gap: 12 (ref 5–15)
BUN: 11 mg/dL (ref 8–23)
CO2: 20 mmol/L — ABNORMAL LOW (ref 22–32)
Calcium: 8.6 mg/dL — ABNORMAL LOW (ref 8.9–10.3)
Chloride: 106 mmol/L (ref 98–111)
Creatinine, Ser: 0.96 mg/dL (ref 0.61–1.24)
GFR, Estimated: >60 mL/min (ref >60)
Glucose, Bld: 117 mg/dL — ABNORMAL HIGH (ref 70–99)
Potassium: 3.5 mmol/L (ref 3.5–5.1)
Sodium: 138 mmol/L (ref 135–145)

## 2022-02-25 LAB — GLUCOSE, CAPILLARY: Glucose-Capillary: 160 mg/dL — ABNORMAL HIGH (ref 70–99)

## 2022-02-25 LAB — CBC
HCT: 31.6 % — ABNORMAL LOW (ref 39.0–52.0)
Hemoglobin: 9.8 g/dL — ABNORMAL LOW (ref 13.0–17.0)
MCH: 26.9 pg (ref 26.0–34.0)
MCHC: 31 g/dL (ref 30.0–36.0)
MCV: 86.8 fL (ref 80.0–100.0)
Platelets: 364 10*3/uL (ref 150–400)
RBC: 3.64 MIL/uL — ABNORMAL LOW (ref 4.22–5.81)
RDW: 18.2 % — ABNORMAL HIGH (ref 11.5–15.5)
WBC: 6.5 10*3/uL (ref 4.0–10.5)
nRBC: 0 % (ref 0.0–0.2)

## 2022-02-25 LAB — BPAM RBC
Blood Product Expiration Date: 202312282359
Blood Product Expiration Date: 202401092359
ISSUE DATE / TIME: 202312241259
ISSUE DATE / TIME: 202312241710
Unit Type and Rh: 5100
Unit Type and Rh: 5100

## 2022-02-25 MED ORDER — HEPARIN (PORCINE) 25000 UT/250ML-% IV SOLN
750.0000 [IU]/h | INTRAVENOUS | Status: DC
  Administered 2022-02-25: 750 [IU]/h via INTRAVENOUS
  Filled 2022-02-25: qty 250

## 2022-02-25 NOTE — Progress Notes (Signed)
ANTICOAGULATION CONSULT NOTE - Initial Consult  Pharmacy Consult for heparin Indication:  NSTEMI  No Known Allergies  Patient Measurements: Height: '5\' 7"'$  (170.2 cm) Weight: 63.7 kg (140 lb 6.9 oz) IBW/kg (Calculated) : 66.1  Vital Signs: Temp: 99.1 F (37.3 C) (12/25 0049) Temp Source: Oral (12/25 0049) BP: 115/76 (12/25 0049) Pulse Rate: 91 (12/25 0049)  Labs: Recent Labs    02/23/22 2257 02/24/22 0159 02/24/22 0650 02/24/22 0800 02/24/22 0828 02/24/22 1010 02/24/22 1628 02/24/22 2128 02/25/22 0149  HGB 8.4*  --   --  7.8*  --   --  9.5* 9.9* 9.8*  HCT 27.3*  --   --  25.2*  --   --  29.9* 30.6* 31.6*  PLT 409*  --   --  390  --   --  374  --  364  CREATININE 1.20  --   --   --   --  0.89  --   --  0.96  TROPONINIHS 28*   < > 150*  --  133* 143*  --   --   --    < > = values in this interval not displayed.   Estimated Creatinine Clearance: 70 mL/min (by C-G formula based on SCr of 0.96 mg/dL).   Medical History: Past Medical History:  Diagnosis Date   AAA (abdominal aortic aneurysm) ([place])    Arthritis    Carotid artery disease (HCC)    Colon polyp    COPD (chronic obstructive pulmonary disease) (HCC)    Coronary artery disease    Nonobstructive   Essential hypertension    GERD (gastroesophageal reflux disease)    Hemorrhoids    History of TIA (transient ischemic attack)    Low back pain    Lumbar radiculopathy    Mixed hyperlipidemia    Palpitations    Peripheral vascular disease ([place])    Shingles 09/23/2019   Tobacco dependence    Type 2 diabetes mellitus (HCC)     Assessment: 66yo male admitted for NSTEMI, initially started on heparin but d/c'd d/t Hgb trending down w/ FOB+, now s/p evaluation by GI and to resume heparin. Will dose heparin cautiously and aim for low goal; noted GI plan to hold UFH 6h prior to scope.  Goal of Therapy:  Heparin level 0.3-0.5 units/ml Monitor platelets by anticoagulation protocol: Yes   Plan:  Heparin infusion  at 750 units/hr. Monitor heparin levels and CBC.  Wynona Neat, PharmD, BCPS  02/25/2022,5:19 AM

## 2022-02-25 NOTE — Discharge Summary (Signed)
Physician Discharge Summary   Patient: Jimmy Duran MRN: [id number] DOB: Jan 29, 1956  Admit date:     02/23/2022  Discharge date: Left AMA 02/25/2022  Discharge Physician: Tyrone Nine   PCP: [name], MD   Recommendations at discharge:  Follow up with Dr. Jena Gauss ASAP for diagnostic work up for GI blood loss anemia requiring transfusion. Pt leaving AMA prior to recommended procedures as inpatient. Advised to hold plavix, and strict return precautions.  Follow up with cardiology ASAP after GI evaluation for ischemic evaluation.   Discharge Diagnoses: Principal Problem:   NSTEMI (non-ST elevated myocardial infarction) [facility]) Active Problems:   Peripheral vascular disease (HCC)   Acute on chronic blood loss anemia   Acute blood loss anemia  Hospital Course: 66 y.o. male  with a hx of nonobstructive CAD by report, hypertension, hyperlipidemia, DM2, PAD s/p stenting of SMA 07/2020 and carotid artery disease s/p right CEA 2021 who was admitted with chest pain and elevated/rising troponin consistent with NSTEMI, and found to have iron-deficiency anemia with downward trend and +FOBT for which GI was consulted. Heparin was held due to worsening anemia pending GI work up. 2u PRBCs given. This was delayed by holiday staff availability and the patient opted to leave the hospital against medical advice on 12/25. At that time the medical team, GI, and cardiology services convened and settled to holding plavix.  Assessment and Plan: Chest pain/NSTEMI: Per cardiology, current presentation would not warrant urgent cardiac catheterization prior to the GI procedures. I would favor GI workup for anemia/GI bleeding before proceeding with invasive cardiac testing.    He states that he is leaving to go home today regardless of recommendations from the care team.  - Continue ASA, beta blocker and statin.   - Will not restart his Plavix.  - Follow up with cardiology as an outpatient    Transfusion-dependent iron deficiency anemia: +FOBT indicating ongoing bleeding, though he is hemodynamically stable at this time, so EGD would be planned 12/26. Pt declines colonoscopy. Pt is on PPI, H2B. He does not wish to remain inpatient, understands the risks of destabilizing and life threatening bleeding as well as cardiac event up to and including death. He currently has the capacity to make that decision and will follow up with his providers after discharge.   Consultants: GI, cardiology Procedures performed: None  Disposition: Home against medical advice  DISCHARGE MEDICATION: Allergies as of 02/25/2022   No Known Allergies      Medication List     ASK your doctor about these medications    albuterol 108 (90 Base) MCG/ACT inhaler Commonly known as: VENTOLIN HFA Inhale 2 puffs into the lungs every 6 (six) hours as needed for wheezing or shortness of breath.   aspirin EC 81 MG tablet Take 1 tablet (81 mg total) by mouth daily at 6 (six) AM. Swallow whole.   clopidogrel 75 MG tablet Commonly known as: Plavix Take 1 tablet (75 mg total) by mouth daily.   diphenhydrAMINE 25 MG tablet Commonly known as: BENADRYL Take 25 mg by mouth daily as needed for allergies.   empagliflozin 25 MG Tabs tablet Commonly known as: Jardiance Take 1 tablet (25 mg total) by mouth daily before breakfast.   famotidine 20 MG tablet Commonly known as: Pepcid Take 1 tablet (20 mg total) by mouth 2 (two) times daily.   FreeStyle Libre 3 Sensor Misc 1 each by Does not apply route every 14 (fourteen) days. Place 1 sensor on the skin every  14 days. Use to check glucose continuously   Gvoke HypoPen 2-Pack 1 MG/0.2ML Soaj Generic drug: Glucagon Inject 1 mg into the skin as needed (hypoglycemia).   insulin glargine 100 UNIT/ML injection Commonly known as: Lantus Inject 0.18 mLs (18 Units total) into the skin daily.   lansoprazole 30 MG capsule Commonly known as: PREVACID Take 1 capsule  (30 mg total) by mouth 2 (two) times daily before a meal.   metFORMIN 1000 MG tablet Commonly known as: GLUCOPHAGE Take 1 tablet (1,000 mg total) by mouth 2 (two) times daily with a meal.   metoprolol tartrate 25 MG tablet Commonly known as: LOPRESSOR Take 0.5 tablets (12.5 mg total) by mouth 2 (two) times daily.   NovoLOG 100 UNIT/ML injection Generic drug: insulin aspart Inject 14 Units into the skin 3 (three) times daily with meals.   rosuvastatin 40 MG tablet Commonly known as: CRESTOR Take 1 tablet (40 mg total) by mouth daily.        Discharge Exam: Filed Weights   02/24/22 0322  Weight: 63.7 kg  BP (!) 166/91 (BP Location: Left Arm)   Pulse 100   Temp 98.9 F (37.2 C) (Oral)   Resp 19   Ht 5\' 7"  (1.702 m)   Wt 63.7 kg   SpO2 100%   BMI 21.99 kg/m   No distress RRR, no MRG or edema Clear, nonlabored Soft, NT, ND, +BS Alert, oriented  Condition at discharge: stable  The results of significant diagnostics from this hospitalization (including imaging, microbiology, ancillary and laboratory) are listed below for reference.   Imaging Studies: ECHOCARDIOGRAM COMPLETE  Result Date: 02/24/2022    ECHOCARDIOGRAM REPORT   Patient Name:   Jimmy Duran Date of Exam: 02/24/2022 Medical Rec #:  [id number]        Height:       67.0 in Accession #:    [id number]       Weight:       140.4 lb Date of Birth:  [birth year]        BSA:          1.740 m Patient Age:    66 years         BP:           142/76 mmHg Patient Gender: M                HR:           92 bpm. Exam Location:  Inpatient Procedure: 2D Echo, Cardiac Doppler and Color Doppler Indications:    Chest Pain R07.9  History:        Patient has prior history of Echocardiogram examinations, most                 recent 12/11/2018. NSTEMI, COPD and TIA; Risk Factors:Current                 Smoker, Diabetes, Dyslipidemia and Hypertension.  Sonographer:    Celesta Gentile RCS Referring Phys: [id number] Dorothe Pea BRANCH IMPRESSIONS   1. Left ventricular ejection fraction, by estimation, is 55 to 60%. The left ventricle has normal function. The left ventricle has no regional wall motion abnormalities. Left ventricular diastolic parameters are indeterminate.  2. Right ventricular systolic function is normal. The right ventricular size is normal. Tricuspid regurgitation signal is inadequate for assessing PA pressure.  3. The mitral valve is grossly normal. Trivial mitral valve regurgitation. No evidence of mitral stenosis.  4. The aortic valve is tricuspid. Aortic  valve regurgitation is mild. No aortic stenosis is present.  5. The inferior vena cava is normal in size with greater than 50% respiratory variability, suggesting right atrial pressure of 3 mmHg. Conclusion(s)/Recommendation(s): Normal biventricular function without evidence of hemodynamically significant valvular heart disease. FINDINGS  Left Ventricle: Left ventricular ejection fraction, by estimation, is 55 to 60%. The left ventricle has normal function. The left ventricle has no regional wall motion abnormalities. The left ventricular internal cavity size was normal in size. There is  no left ventricular hypertrophy. Left ventricular diastolic parameters are indeterminate. Right Ventricle: The right ventricular size is normal. No increase in right ventricular wall thickness. Right ventricular systolic function is normal. Tricuspid regurgitation signal is inadequate for assessing PA pressure. Left Atrium: Left atrial size was normal in size. Right Atrium: Right atrial size was normal in size. Pericardium: There is no evidence of pericardial effusion. Presence of epicardial fat layer. Mitral Valve: The mitral valve is grossly normal. Trivial mitral valve regurgitation. No evidence of mitral valve stenosis. Tricuspid Valve: The tricuspid valve is grossly normal. Tricuspid valve regurgitation is trivial. No evidence of tricuspid stenosis. Aortic Valve: The aortic valve is tricuspid.  Aortic valve regurgitation is mild. No aortic stenosis is present. Pulmonic Valve: The pulmonic valve was grossly normal. Pulmonic valve regurgitation is not visualized. No evidence of pulmonic stenosis. Aorta: The aortic root is normal in size and structure. Venous: The inferior vena cava is normal in size with greater than 50% respiratory variability, suggesting right atrial pressure of 3 mmHg. IAS/Shunts: The atrial septum is grossly normal.  LEFT VENTRICLE PLAX 2D LVIDd:         3.90 cm   Diastology LVIDs:         2.60 cm   LV e' medial:    6.20 cm/s LV PW:         1.10 cm   LV E/e' medial:  14.6 LV IVS:        1.10 cm   LV e' lateral:   8.05 cm/s LVOT diam:     2.30 cm   LV E/e' lateral: 11.2 LV SV:         108 LV SV Index:   62 LVOT Area:     4.15 cm  RIGHT VENTRICLE RV S prime:     10.20 cm/s TAPSE (M-mode): 1.7 cm LEFT ATRIUM             Index        RIGHT ATRIUM           Index LA diam:        3.50 cm 2.01 cm/m   RA Area:     15.00 cm LA Vol (A2C):   41.1 ml 23.62 ml/m  RA Volume:   37.20 ml  21.38 ml/m LA Vol (A4C):   43.4 ml 24.94 ml/m LA Biplane Vol: 46.9 ml 26.95 ml/m  AORTIC VALVE LVOT Vmax:   130.00 cm/s LVOT Vmean:  84.100 cm/s LVOT VTI:    0.259 m  AORTA Ao Root diam: 3.60 cm MITRAL VALVE MV Area (PHT): 3.53 cm    SHUNTS MV Decel Time: 215 msec    Systemic VTI:  0.26 m MV E velocity: 90.40 cm/s  Systemic Diam: 2.30 cm MV A velocity: 78.80 cm/s MV E/A ratio:  1.15 Lennie Odor MD Electronically signed by Lennie Odor MD Signature Date/Time: 02/24/2022/4:03:32 PM    Final    DG Chest Port 1 View  Result Date: 02/23/2022 CLINICAL DATA:  Midsternal chest pain with nausea and vomiting EXAM: PORTABLE CHEST 1 VIEW COMPARISON:  CT chest 09/06/2021 FINDINGS: Normal cardiomediastinal silhouette. Aortic atherosclerotic calcification. No focal consolidation, pleural effusion, or pneumothorax. No acute osseous abnormality. Electronic device projects over the left axilla. Partially visualized  vascular stent in the left neck. IMPRESSION: No active disease. Electronically Signed   By: Minerva Fester M.D.   On: 02/23/2022 23:18    Microbiology: Results for orders placed or performed during the hospital encounter of 10/04/21  Surgical pcr screen     Status: Abnormal   Collection Time: 10/04/21  1:23 PM   Specimen: Nasal Mucosa; Nasal Swab  Result Value Ref Range Status   MRSA, PCR NEGATIVE NEGATIVE Final   Staphylococcus aureus POSITIVE (A) NEGATIVE Final    Comment: (NOTE) The Xpert SA Assay (FDA approved for NASAL specimens in patients 19 years of age and older), is one component of a comprehensive surveillance program. It is not intended to diagnose infection nor to guide or monitor treatment. Performed at [facility], [address], [place], [place] [address]     Labs: CBC: Recent Labs  Lab 02/23/22 2257 02/24/22 0800 02/24/22 1628 02/24/22 2128 02/25/22 0149  WBC 7.2 6.4 11.6*  --  6.5  HGB 8.4* 7.8* 9.5* 9.9* 9.8*  HCT 27.3* 25.2* 29.9* 30.6* 31.6*  MCV 86.4 86.6 85.9  --  86.8  PLT 409* 390 374  --  364   Basic Metabolic Panel: Recent Labs  Lab 02/23/22 2257 02/24/22 1010 02/25/22 0149  NA 138  --  138  K 3.7  --  3.5  CL 104  --  106  CO2 23  --  20*  GLUCOSE 327*  --  117*  BUN 13  --  11  CREATININE 1.20 0.89 0.96  CALCIUM 8.9  --  8.6*   Liver Function Tests: No results for input(s): "AST", "ALT", "ALKPHOS", "BILITOT", "PROT", "ALBUMIN" in the last 168 hours. CBG: Recent Labs  Lab 02/24/22 0820 02/24/22 1206 02/24/22 1627 02/24/22 2025 02/25/22 0823  GLUCAP 194* 177* 168* 127* 160*    Discharge time spent: greater than 30 minutes.  Signed: Tyrone Nine, MD [facility] 02/26/2022

## 2022-02-25 NOTE — Progress Notes (Signed)
Pt leaving  AMA. MD Bonner Puna, cardiology and GI have all seen the patient and educated him on the risks. He is to make follow up appointment with his primary care, cardiology, and GI provider outpatient in [place]. He was educated on the signs and symptoms of heart attack and GI bleeding. He is to seek immediate medical assistance if he has any worsening chest pain or GI bleeding. He is to continue is aspirin and stop plavix until his follow up pcp appointment. He signed the [facility] papers.

## 2022-02-25 NOTE — Progress Notes (Signed)
Rounding Note    Patient Name: Jimmy Duran Date of Encounter: 02/25/2022  [place] Cardiologist: [name], MD   Subjective   No complaints. He states that he is leaving today.   Inpatient Medications    Scheduled Meds:  aspirin EC  81 mg Oral Q0600   famotidine  20 mg Oral BID   insulin aspart  0-15 Units Subcutaneous TID WC   insulin aspart  0-5 Units Subcutaneous QHS   insulin glargine-yfgn  10 Units Subcutaneous Daily   metoprolol tartrate  12.5 mg Oral BID   nicotine  14 mg Transdermal Daily   pantoprazole  40 mg Oral Daily   rosuvastatin  40 mg Oral Daily   sodium chloride flush  3 mL Intravenous Q12H   Continuous Infusions:  sodium chloride     heparin 750 Units/hr (02/25/22 0556)   PRN Meds: sodium chloride, acetaminophen, albuterol, nitroGLYCERIN, ondansetron (ZOFRAN) IV, sodium chloride flush   Vital Signs    Vitals:   02/24/22 1923 02/24/22 2029 02/25/22 0049 02/25/22 0520  BP: (!) 145/88 105/69 115/76 128/77  Pulse: 97 90 91 86  Resp: '18 18 18 19  '$ Temp: 98.9 F (37.2 C) 98.1 F (36.7 C) 99.1 F (37.3 C) 98.7 F (37.1 C)  TempSrc: Oral Oral Oral Oral  SpO2: 97% 96% 96% 94%  Weight:      Height:        Intake/Output Summary (Last 24 hours) at 02/25/2022 0714 Last data filed at 02/25/2022 0500 Gross per 24 hour  Intake 287.5 ml  Output 302 ml  Net -14.5 ml      02/24/2022    3:22 AM 02/21/2022    9:22 AM 02/05/2022   11:02 AM  Last 3 Weights  Weight (lbs) 140 lb 6.9 oz 140 lb 6.4 oz 138 lb 3.2 oz  Weight (kg) 63.7 kg 63.685 kg 62.687 kg      Telemetry     sinus - Personally Reviewed  ECG    No am ekg - Personally Reviewed  Physical Exam   GEN: No acute distress.   Neck: No JVD Cardiac: RRR, no murmurs, rubs, or gallops.  Respiratory: Clear to auscultation bilaterally. GI: Soft, nontender, non-distended  MS: No edema; No deformity. Neuro:  Nonfocal  Psych: Normal affect   Labs    High  Sensitivity Troponin:   Recent Labs  Lab 02/23/22 2257 02/24/22 0159 02/24/22 0650 02/24/22 0828 02/24/22 1010  TROPONINIHS 28* 74* 150* 133* 143*     Chemistry Recent Labs  Lab 02/23/22 2257 02/24/22 1010 02/25/22 0149  NA 138  --  138  K 3.7  --  3.5  CL 104  --  106  CO2 23  --  20*  GLUCOSE 327*  --  117*  BUN 13  --  11  CREATININE 1.20 0.89 0.96  CALCIUM 8.9  --  8.6*  GFRNONAA >60 >60 >60  ANIONGAP 11  --  12    Lipids  Recent Labs  Lab 02/25/22 0149  CHOL 92  TRIG 230*  HDL 37*  LDLCALC 9  CHOLHDL 2.5    Hematology Recent Labs  Lab 02/24/22 0800 02/24/22 1628 02/24/22 2128 02/25/22 0149  WBC 6.4 11.6*  --  6.5  RBC 2.91* 3.48*  --  3.64*  HGB 7.8* 9.5* 9.9* 9.8*  HCT 25.2* 29.9* 30.6* 31.6*  MCV 86.6 85.9  --  86.8  MCH 26.8 27.3  --  26.9  MCHC 31.0 31.8  --  31.0  RDW 17.5* 17.1*  --  18.2*  PLT 390 374  --  364   Thyroid No results for input(s): "TSH", "FREET4" in the last 168 hours.  BNPNo results for input(s): "BNP", "PROBNP" in the last 168 hours.  DDimer No results for input(s): "DDIMER" in the last 168 hours.   Radiology    ECHOCARDIOGRAM COMPLETE  Result Date: 02/24/2022    ECHOCARDIOGRAM REPORT   Patient Name:   Jimmy Duran Date of Exam: 02/24/2022 Medical Rec #:  [id number]        Height:       67.0 in Accession #:    [id number]       Weight:       140.4 lb Date of Birth:  [birth year]        BSA:          1.740 m Patient Age:    66 years         BP:           142/76 mmHg Patient Gender: M                HR:           92 bpm. Exam Location:  Inpatient Procedure: 2D Echo, Cardiac Doppler and Color Doppler Indications:    Chest Pain R07.9  History:        Patient has prior history of Echocardiogram examinations, most                 recent 12/11/2018. NSTEMI, COPD and TIA; Risk Factors:Current                 Smoker, Diabetes, Dyslipidemia and Hypertension.  Sonographer:    Alvino Chapel RCS Referring Phys: [id number] [place]  1. Left ventricular ejection fraction, by estimation, is 55 to 60%. The left ventricle has normal function. The left ventricle has no regional wall motion abnormalities. Left ventricular diastolic parameters are indeterminate.  2. Right ventricular systolic function is normal. The right ventricular size is normal. Tricuspid regurgitation signal is inadequate for assessing PA pressure.  3. The mitral valve is grossly normal. Trivial mitral valve regurgitation. No evidence of mitral stenosis.  4. The aortic valve is tricuspid. Aortic valve regurgitation is mild. No aortic stenosis is present.  5. The inferior vena cava is normal in size with greater than 50% respiratory variability, suggesting right atrial pressure of 3 mmHg. Conclusion(s)/Recommendation(s): Normal biventricular function without evidence of hemodynamically significant valvular heart disease. FINDINGS  Left Ventricle: Left ventricular ejection fraction, by estimation, is 55 to 60%. The left ventricle has normal function. The left ventricle has no regional wall motion abnormalities. The left ventricular internal cavity size was normal in size. There is  no left ventricular hypertrophy. Left ventricular diastolic parameters are indeterminate. Right Ventricle: The right ventricular size is normal. No increase in right ventricular wall thickness. Right ventricular systolic function is normal. Tricuspid regurgitation signal is inadequate for assessing PA pressure. Left Atrium: Left atrial size was normal in size. Right Atrium: Right atrial size was normal in size. Pericardium: There is no evidence of pericardial effusion. Presence of epicardial fat layer. Mitral Valve: The mitral valve is grossly normal. Trivial mitral valve regurgitation. No evidence of mitral valve stenosis. Tricuspid Valve: The tricuspid valve is grossly normal. Tricuspid valve regurgitation is trivial. No evidence of tricuspid stenosis. Aortic Valve: The aortic valve is  tricuspid. Aortic valve regurgitation is mild. No aortic stenosis is present. Pulmonic  Valve: The pulmonic valve was grossly normal. Pulmonic valve regurgitation is not visualized. No evidence of pulmonic stenosis. Aorta: The aortic root is normal in size and structure. Venous: The inferior vena cava is normal in size with greater than 50% respiratory variability, suggesting right atrial pressure of 3 mmHg. IAS/Shunts: The atrial septum is grossly normal.  LEFT VENTRICLE PLAX 2D LVIDd:         3.90 cm   Diastology LVIDs:         2.60 cm   LV e' medial:    6.20 cm/s LV PW:         1.10 cm   LV E/e' medial:  14.6 LV IVS:        1.10 cm   LV e' lateral:   8.05 cm/s LVOT diam:     2.30 cm   LV E/e' lateral: 11.2 LV SV:         108 LV SV Index:   62 LVOT Area:     4.15 cm  RIGHT VENTRICLE RV S prime:     10.20 cm/s TAPSE (M-mode): 1.7 cm LEFT ATRIUM             Index        RIGHT ATRIUM           Index LA diam:        3.50 cm 2.01 cm/m   RA Area:     15.00 cm LA Vol (A2C):   41.1 ml 23.62 ml/m  RA Volume:   37.20 ml  21.38 ml/m LA Vol (A4C):   43.4 ml 24.94 ml/m LA Biplane Vol: 46.9 ml 26.95 ml/m  AORTIC VALVE LVOT Vmax:   130.00 cm/s LVOT Vmean:  84.100 cm/s LVOT VTI:    0.259 m  AORTA Ao Root diam: 3.60 cm MITRAL VALVE MV Area (PHT): 3.53 cm    SHUNTS MV Decel Time: 215 msec    Systemic VTI:  0.26 m MV E velocity: 90.40 cm/s  Systemic Diam: 2.30 cm MV A velocity: 78.80 cm/s MV E/A ratio:  1.15 Eleonore Chiquito MD Electronically signed by Eleonore Chiquito MD Signature Date/Time: 02/24/2022/4:03:32 PM    Final    DG Chest Port 1 View  Result Date: 02/23/2022 CLINICAL DATA:  Midsternal chest pain with nausea and vomiting EXAM: PORTABLE CHEST 1 VIEW COMPARISON:  CT chest 09/06/2021 FINDINGS: Normal cardiomediastinal silhouette. Aortic atherosclerotic calcification. No focal consolidation, pleural effusion, or pneumothorax. No acute osseous abnormality. Electronic device projects over the left axilla. Partially  visualized vascular stent in the left neck. IMPRESSION: No active disease. Electronically Signed   By: Placido Sou M.D.   On: 02/23/2022 23:18    Cardiac Studies   See above  Patient Profile     66 y.o. male  with a hx of nonobstructive CAD by report, hypertension, hyperlipidemia, DM2, PAD s/p stenting of SMA 07/2020 and carotid artery disease s/p right CEA 2021 who was admitted with chest pain and found to have anemia/ GI bleeding, mild elevation troponin.   Assessment & Plan    Chest pain/NSTEMI: No recurrent chest pain. Mild troponin elevation with peak HsTroponin of 150 in setting of anemia and heme positive stool. He is now s/p transfusion. Echo with normal LV function and no regional wall motion abnormalities. He is on ASA and Plavix at home secondary to his PAD. Plavix has been held. He remains on ASA. IV heparin was not started secondary to the anemia/GI bleeding. GI is following.   His  mild troponin elevation is likely due to demand ischemia in the setting of anemia. He most likely has underlying CAD but I do not think his current presentation would warrant urgent cardiac catheterization prior to the GI procedures. I would favor GI workup for anemia/GI bleeding before proceeding with invasive cardiac testing.   He states that he is leaving to go home today regardless of recommendations from the care team. Continue ASA, beta blocker and statin. I would not restart his Plavix. He can have outpatient cardiac follow up if he decides to leave today  For questions or updates, please contact [place] Please consult [email or web address] for contact info under        Signed, Lauree Chandler, MD  02/25/2022, 7:14 AM

## 2022-02-26 ENCOUNTER — Telehealth: Payer: Self-pay | Admitting: Internal Medicine

## 2022-02-26 NOTE — Telephone Encounter (Signed)
Pt wife called stating that he was admitted to hosp on 12.23.23 with a slight heart attack, stomach bleeding & possible rectum bleeding. Discharged 12.25.23. States he is to see cardiology & GI. He is already scheduled for our office on 03/07/21 @ 10:40 (changed appt from 20 to 40 mins to add in as hosp f/u). Pt wife just wanted to inform you of what is going on.

## 2022-02-27 ENCOUNTER — Telehealth: Payer: Self-pay

## 2022-02-27 LAB — LIPOPROTEIN A (LPA): Lipoprotein (a): 30.9 nmol/L — ABNORMAL HIGH (ref <75.0)

## 2022-02-27 NOTE — Telephone Encounter (Signed)
Pt was recently in the hospital and was found to have blood in his stool. Wife called stating that the hospital is wanting him to have an EGD/Colonoscopy due to this. Do we need to schedule the pt for a hospital follow up to discuss this further? Pt's wife is aware that you are out today and will return tomorrow. Please advise.

## 2022-02-28 ENCOUNTER — Encounter: Payer: Self-pay | Admitting: *Deleted

## 2022-02-28 MED ORDER — PEG 3350-KCL-NA BICARB-NACL 420 G PO SOLR: 4000.0000 mL | Freq: Once | ORAL | 0 refills | Status: AC

## 2022-02-28 NOTE — Telephone Encounter (Signed)
Needs to be scheduled ASAP due to patient having to hold Plavix and cardiology holding off work up until GI work up completed.

## 2022-02-28 NOTE — Telephone Encounter (Signed)
Wife called back following up on this. Please advise.

## 2022-02-28 NOTE — Telephone Encounter (Signed)
[employer] PA: Approved Authorization #[id number]  Tracking #[id number]  DOS: 03/06/22-05/06/22

## 2022-02-28 NOTE — Telephone Encounter (Signed)
Reviewed hospitalization records, since I saw him on 02/05/22. We had planned for colonoscopy for surveillance but prior to getting that scheduled he ended up hospitalized with NSTEMI. EGD completed in 07/2021.   He saw GI while inpatient in [place] on 02/24/22. Noted to have acute on chronic IDA. Stool heme positive. Had been receiving iv iron as outpatient. Still consume ASA powders per notes. No overt gi bleeding reported. He required two units of prbcs. Plavix held. Remains on ASA. EGD and colonoscopy offered 02/26/22. Cardiology recommended prior to consideration of cardiac cath (not felt to be urgent need). Patient left AMA because he did not want to be in hospital on Christmas day and wait for procedures on 02/26/22.   Patient did report two "massive" nosebleeds in the past one month.   Cardiology requesting GI work up prior to cardiac cath.  Please arrange for colonoscopy+/-EGD with Dr. Gala Romney. ASA 3/4. Hold jardiance 72 hours before.  Day of prep: lantus 9 units daily, metformin am only, novolog7 units with meals AM of procedures: hold lantus, novolog, metformin.

## 2022-02-28 NOTE — Telephone Encounter (Signed)
Pt has been scheduled for 03/06/22 at 12:30 pm. Instructions sent via MyChart and prep sent to the pharmacy.

## 2022-02-28 NOTE — Telephone Encounter (Signed)
Pt was made aware and is ready to move forward with scheduling.

## 2022-03-01 NOTE — Patient Instructions (Signed)
[place]  03/01/2022     '@[email or web address]'$ @   Your procedure is scheduled on  03/06/2022.   Report to [facility] at  1030  A.M.   Call this number if you have problems the morning of surgery:  [phone]  If you experience any cold or flu symptoms such as cough, fever, chills, shortness of breath, etc. between now and your scheduled surgery, please notify us at the above number.   Remember:  Follow the diet prep instructions given to you by the office.      Use your inhaler before you come and bring your rescue inhaler with you.    Your last dose of jardiance should be on 03/02/2022.      DO NOT take any medications for diabetes the morning of your procedure.     Take these medicines the morning of surgery with A SIP OF WATER                          pepcid, prevacid, metoprolol.     Do not wear jewelry, make-up or nail polish.  Do not wear lotions, powders, or perfumes, or deodorant.  Do not shave 48 hours prior to surgery.  Men may shave face and neck.  Do not bring valuables to the hospital.  [facility] is not responsible for any belongings or valuables.  Contacts, dentures or bridgework may not be worn into surgery.  Leave your suitcase in the car.  After surgery it may be brought to your room.  For patients admitted to the hospital, discharge time will be determined by your treatment team.  Patients discharged the day of surgery will not be allowed to drive home and must have someone with them for 24 hours.    Special instructions:   DO NOT smoke tobacco or vape for 24 hours before your procedure.  Please read over the following fact sheets that you were given. Anesthesia Post-op Instructions and Care and Recovery After Surgery       Upper Endoscopy, Adult, Care After After the procedure, it is common to have a sore throat. It is also common to have: Mild stomach pain or discomfort. Bloating. Nausea. Follow these instructions at  home: The instructions below may help you care for yourself at home. Your health care provider may give you more instructions. If you have questions, ask your health care provider. If you were given a sedative during the procedure, it can affect you for several hours. Do not drive or operate machinery until your health care provider says that it is safe. If you will be going home right after the procedure, plan to have a responsible adult: Take you home from the hospital or clinic. You will not be allowed to drive. Care for you for the time you are told. Follow instructions from your health care provider about what you may eat and drink. Return to your normal activities as told by your health care provider. Ask your health care provider what activities are safe for you. Take over-the-counter and prescription medicines only as told by your health care provider. Contact a health care provider if you: Have a sore throat that lasts longer than one day. Have trouble swallowing. Have a fever. Get help right away if you: Vomit blood or your vomit looks like coffee grounds. Have bloody, black, or tarry stools. Have a very bad sore throat or you cannot swallow. Have  difficulty breathing or very bad pain in your chest or abdomen. These symptoms may be an emergency. Get help right away. Call 911. Do not wait to see if the symptoms will go away. Do not drive yourself to the hospital. Summary After the procedure, it is common to have a sore throat, mild stomach discomfort, bloating, and nausea. If you were given a sedative during the procedure, it can affect you for several hours. Do not drive until your health care provider says that it is safe. Follow instructions from your health care provider about what you may eat and drink. Return to your normal activities as told by your health care provider. This information is not intended to replace advice given to you by your health care provider. Make sure  you discuss any questions you have with your health care provider. Document Revised: 05/30/2021 Document Reviewed: 05/30/2021 Elsevier Patient Education  [place]. Esophageal Dilatation Esophageal dilatation, also called esophageal dilation, is a procedure to widen or open a blocked or narrowed part of the esophagus. The esophagus is the part of the body that moves food and liquid from the mouth to the stomach. You may need this procedure if: You have a buildup of scar tissue in your esophagus that makes it difficult, painful, or impossible to swallow. This can be caused by gastroesophageal reflux disease (GERD). You have cancer of the esophagus. There is a problem with how food moves through your esophagus. In some cases, you may need this procedure repeated at a later time to dilate the esophagus gradually. Tell a health care provider about: Any allergies you have. All medicines you are taking, including vitamins, herbs, eye drops, creams, and over-the-counter medicines. Any problems you or family members have had with anesthetic medicines. Any blood disorders you have. Any surgeries you have had. Any medical conditions you have. Any antibiotic medicines you are required to take before dental procedures. Whether you are pregnant or may be pregnant. What are the risks? Generally, this is a safe procedure. However, problems may occur, including: Bleeding due to a tear in the lining of the esophagus. A hole, or perforation, in the esophagus. What happens before the procedure? Ask your health care provider about: Changing or stopping your regular medicines. This is especially important if you are taking diabetes medicines or blood thinners. Taking medicines such as aspirin and ibuprofen. These medicines can thin your blood. Do not take these medicines unless your health care provider tells you to take them. Taking over-the-counter medicines, vitamins, herbs, and supplements. Follow  instructions from your health care provider about eating or drinking restrictions. Plan to have a responsible adult take you home from the hospital or clinic. Plan to have a responsible adult care for you for the time you are told after you leave the hospital or clinic. This is important. What happens during the procedure? You may be given a medicine to help you relax (sedative). A numbing medicine may be sprayed into the back of your throat, or you may gargle the medicine. Your health care provider may perform the dilatation using various surgical instruments, such as: Simple dilators. This instrument is carefully placed in the esophagus to stretch it. Guided wire bougies. This involves using an endoscope to insert a wire into the esophagus. A dilator is passed over this wire to enlarge the esophagus. Then the wire is removed. Balloon dilators. An endoscope with a small balloon is inserted into the esophagus. The balloon is inflated to stretch the esophagus  and open it up. The procedure may vary among health care providers and hospitals. What can I expect after the procedure? Your blood pressure, heart rate, breathing rate, and blood oxygen level will be monitored until you leave the hospital or clinic. Your throat may feel slightly sore and numb. This will get better over time. You will not be allowed to eat or drink until your throat is no longer numb. When you are able to drink, urinate, and sit on the edge of the bed without nausea or dizziness, you may be able to return home. Follow these instructions at home: Take over-the-counter and prescription medicines only as told by your health care provider. If you were given a sedative during the procedure, it can affect you for several hours. Do not drive or operate machinery until your health care provider says that it is safe. Plan to have a responsible adult care for you for the time you are told. This is important. Follow instructions from  your health care provider about any eating or drinking restrictions. Do not use any products that contain nicotine or tobacco, such as cigarettes, e-cigarettes, and chewing tobacco. If you need help quitting, ask your health care provider. Keep all follow-up visits. This is important. Contact a health care provider if: You have a fever. You have pain that is not relieved by medicine. Get help right away if: You have chest pain. You have trouble breathing. You have trouble swallowing. You vomit blood. You have black, tarry, or bloody stools. These symptoms may represent a serious problem that is an emergency. Do not wait to see if the symptoms will go away. Get medical help right away. Call your local emergency services (911 in the [place] Do not drive yourself to the hospital. Summary Esophageal dilatation, also called esophageal dilation, is a procedure to widen or open a blocked or narrowed part of the esophagus. Plan to have a responsible adult take you home from the hospital or clinic. For this procedure, a numbing medicine may be sprayed into the back of your throat, or you may gargle the medicine. Do not drive or operate machinery until your health care provider says that it is safe. This information is not intended to replace advice given to you by your health care provider. Make sure you discuss any questions you have with your health care provider. Document Revised: 07/07/2019 Document Reviewed: 07/07/2019 Elsevier Patient Education  [place]. Colonoscopy, Adult, Care After The following information offers guidance on how to care for yourself after your procedure. Your health care provider may also give you more specific instructions. If you have problems or questions, contact your health care provider. What can I expect after the procedure? After the procedure, it is common to have: A small amount of blood in your stool for 24 hours after the procedure. Some gas. Mild  cramping or bloating of your abdomen. Follow these instructions at home: Eating and drinking  Drink enough fluid to keep your urine pale yellow. Follow instructions from your health care provider about eating or drinking restrictions. Resume your normal diet as told by your health care provider. Avoid heavy or fried foods that are hard to digest. Activity Rest as told by your health care provider. Avoid sitting for a long time without moving. Get up to take short walks every 1-2 hours. This is important to improve blood flow and breathing. Ask for help if you feel weak or unsteady. Return to your normal activities as told by  your health care provider. Ask your health care provider what activities are safe for you. Managing cramping and bloating  Try walking around when you have cramps or feel bloated. If directed, apply heat to your abdomen as told by your health care provider. Use the heat source that your health care provider recommends, such as a moist heat pack or a heating pad. Place a towel between your skin and the heat source. Leave the heat on for 20-30 minutes. Remove the heat if your skin turns bright red. This is especially important if you are unable to feel pain, heat, or cold. You have a greater risk of getting burned. General instructions If you were given a sedative during the procedure, it can affect you for several hours. Do not drive or operate machinery until your health care provider says that it is safe. For the first 24 hours after the procedure: Do not sign important documents. Do not drink alcohol. Do your regular daily activities at a slower pace than normal. Eat soft foods that are easy to digest. Take over-the-counter and prescription medicines only as told by your health care provider. Keep all follow-up visits. This is important. Contact a health care provider if: You have blood in your stool 2-3 days after the procedure. Get help right away if: You have  more than a small spotting of blood in your stool. You have large blood clots in your stool. You have swelling of your abdomen. You have nausea or vomiting. You have a fever. You have increasing pain in your abdomen that is not relieved with medicine. These symptoms may be an emergency. Get help right away. Call 911. Do not wait to see if the symptoms will go away. Do not drive yourself to the hospital. Summary After the procedure, it is common to have a small amount of blood in your stool. You may also have mild cramping and bloating of your abdomen. If you were given a sedative during the procedure, it can affect you for several hours. Do not drive or operate machinery until your health care provider says that it is safe. Get help right away if you have a lot of blood in your stool, nausea or vomiting, a fever, or increased pain in your abdomen. This information is not intended to replace advice given to you by your health care provider. Make sure you discuss any questions you have with your health care provider. Document Revised: 10/11/2020 Document Reviewed: 10/11/2020 Elsevier Patient Education  [place] After The following information offers guidance on how to care for yourself after your procedure. Your health care provider may also give you more specific instructions. If you have problems or questions, contact your health care provider. What can I expect after the procedure? After the procedure, it is common to have: Tiredness. Little or no memory about what happened during or after the procedure. Impaired judgment when it comes to making decisions. Nausea or vomiting. Some trouble with balance. Follow these instructions at home: For the time period you were told by your health care provider:  Rest. Do not participate in activities where you could fall or become injured. Do not drive or use machinery. Do not drink alcohol. Do not take  sleeping pills or medicines that cause drowsiness. Do not make important decisions or sign legal documents. Do not take care of children on your own. Medicines Take over-the-counter and prescription medicines only as told by your health care provider. If you  were prescribed antibiotics, take them as told by your health care provider. Do not stop using the antibiotic even if you start to feel better. Eating and drinking Follow instructions from your health care provider about what you may eat and drink. Drink enough fluid to keep your urine pale yellow. If you vomit: Drink clear fluids slowly and in small amounts as you are able. Clear fluids include water, ice chips, low-calorie sports drinks, and fruit juice that has water added to it (diluted fruit juice). Eat light and bland foods in small amounts as you are able. These foods include bananas, applesauce, rice, lean meats, toast, and crackers. General instructions  Have a responsible adult stay with you for the time you are told. It is important to have someone help care for you until you are awake and alert. If you have sleep apnea, surgery and some medicines can increase your risk for breathing problems. Follow instructions from your health care provider about wearing your sleep device: When you are sleeping. This includes during daytime naps. While taking prescription pain medicines, sleeping medicines, or medicines that make you drowsy. Do not use any products that contain nicotine or tobacco. These products include cigarettes, chewing tobacco, and vaping devices, such as e-cigarettes. If you need help quitting, ask your health care provider. Contact a health care provider if: You feel nauseous or vomit every time you eat or drink. You feel light-headed. You are still sleepy or having trouble with balance after 24 hours. You get a rash. You have a fever. You have redness or swelling around the IV site. Get help right away if: You  have trouble breathing. You have new confusion after you get home. These symptoms may be an emergency. Get help right away. Call 911. Do not wait to see if the symptoms will go away. Do not drive yourself to the hospital. This information is not intended to replace advice given to you by your health care provider. Make sure you discuss any questions you have with your health care provider. Document Revised: 07/16/2021 Document Reviewed: 07/16/2021 Elsevier Patient Education  [place].

## 2022-03-05 ENCOUNTER — Encounter (HOSPITAL_COMMUNITY): Payer: Self-pay

## 2022-03-05 ENCOUNTER — Other Ambulatory Visit: Payer: Self-pay

## 2022-03-05 ENCOUNTER — Encounter (HOSPITAL_COMMUNITY)
Admission: RE | Admit: 2022-03-05 | Discharge: 2022-03-05 | Disposition: A | Discharge status: Home / Self Care | Payer: Medicare HMO | Source: Ambulatory Visit | Attending: Internal Medicine | Admitting: Internal Medicine

## 2022-03-06 ENCOUNTER — Other Ambulatory Visit: Payer: Self-pay

## 2022-03-06 ENCOUNTER — Encounter (HOSPITAL_COMMUNITY)
Admission: RE | Disposition: A | Discharge status: Home / Self Care | Payer: Self-pay | Source: Home / Self Care | Attending: Internal Medicine

## 2022-03-06 ENCOUNTER — Telehealth: Payer: Self-pay | Admitting: *Deleted

## 2022-03-06 ENCOUNTER — Ambulatory Visit (HOSPITAL_COMMUNITY): Payer: Medicare HMO | Admitting: Anesthesiology

## 2022-03-06 ENCOUNTER — Encounter (HOSPITAL_COMMUNITY): Payer: Self-pay | Admitting: Internal Medicine

## 2022-03-06 ENCOUNTER — Ambulatory Visit (HOSPITAL_COMMUNITY)
Admission: RE | Admit: 2022-03-06 | Discharge: 2022-03-06 | Disposition: A | Discharge status: Home / Self Care | Payer: Medicare HMO | Attending: Internal Medicine | Admitting: Internal Medicine

## 2022-03-06 ENCOUNTER — Ambulatory Visit (HOSPITAL_BASED_OUTPATIENT_CLINIC_OR_DEPARTMENT_OTHER): Payer: Medicare HMO | Admitting: Anesthesiology

## 2022-03-06 DIAGNOSIS — I6522 Occlusion and stenosis of left carotid artery: Secondary | ICD-10-CM | POA: Insufficient documentation

## 2022-03-06 DIAGNOSIS — K219 Gastro-esophageal reflux disease without esophagitis: Secondary | ICD-10-CM | POA: Diagnosis not present

## 2022-03-06 DIAGNOSIS — D509 Iron deficiency anemia, unspecified: Secondary | ICD-10-CM | POA: Diagnosis not present

## 2022-03-06 DIAGNOSIS — I10 Essential (primary) hypertension: Secondary | ICD-10-CM | POA: Diagnosis not present

## 2022-03-06 DIAGNOSIS — Z7902 Long term (current) use of antithrombotics/antiplatelets: Secondary | ICD-10-CM | POA: Insufficient documentation

## 2022-03-06 DIAGNOSIS — D120 Benign neoplasm of cecum: Secondary | ICD-10-CM | POA: Diagnosis not present

## 2022-03-06 DIAGNOSIS — K449 Diaphragmatic hernia without obstruction or gangrene: Secondary | ICD-10-CM | POA: Insufficient documentation

## 2022-03-06 DIAGNOSIS — Z8673 Personal history of transient ischemic attack (TIA), and cerebral infarction without residual deficits: Secondary | ICD-10-CM | POA: Insufficient documentation

## 2022-03-06 DIAGNOSIS — E1165 Type 2 diabetes mellitus with hyperglycemia: Secondary | ICD-10-CM | POA: Diagnosis not present

## 2022-03-06 DIAGNOSIS — R195 Other fecal abnormalities: Secondary | ICD-10-CM | POA: Diagnosis not present

## 2022-03-06 DIAGNOSIS — F17210 Nicotine dependence, cigarettes, uncomplicated: Secondary | ICD-10-CM | POA: Diagnosis not present

## 2022-03-06 DIAGNOSIS — D126 Benign neoplasm of colon, unspecified: Secondary | ICD-10-CM

## 2022-03-06 DIAGNOSIS — Z800 Family history of malignant neoplasm of digestive organs: Secondary | ICD-10-CM | POA: Diagnosis not present

## 2022-03-06 DIAGNOSIS — Z79899 Other long term (current) drug therapy: Secondary | ICD-10-CM | POA: Insufficient documentation

## 2022-03-06 DIAGNOSIS — Z794 Long term (current) use of insulin: Secondary | ICD-10-CM | POA: Insufficient documentation

## 2022-03-06 DIAGNOSIS — Z7984 Long term (current) use of oral hypoglycemic drugs: Secondary | ICD-10-CM | POA: Diagnosis not present

## 2022-03-06 DIAGNOSIS — I2510 Atherosclerotic heart disease of native coronary artery without angina pectoris: Secondary | ICD-10-CM | POA: Diagnosis not present

## 2022-03-06 DIAGNOSIS — E1151 Type 2 diabetes mellitus with diabetic peripheral angiopathy without gangrene: Secondary | ICD-10-CM | POA: Insufficient documentation

## 2022-03-06 DIAGNOSIS — Z95828 Presence of other vascular implants and grafts: Secondary | ICD-10-CM | POA: Insufficient documentation

## 2022-03-06 DIAGNOSIS — K5730 Diverticulosis of large intestine without perforation or abscess without bleeding: Secondary | ICD-10-CM

## 2022-03-06 DIAGNOSIS — K551 Chronic vascular disorders of intestine: Secondary | ICD-10-CM | POA: Insufficient documentation

## 2022-03-06 DIAGNOSIS — Z86010 Personal history of colonic polyps: Secondary | ICD-10-CM | POA: Insufficient documentation

## 2022-03-06 DIAGNOSIS — J449 Chronic obstructive pulmonary disease, unspecified: Secondary | ICD-10-CM | POA: Diagnosis not present

## 2022-03-06 DIAGNOSIS — D500 Iron deficiency anemia secondary to blood loss (chronic): Secondary | ICD-10-CM | POA: Insufficient documentation

## 2022-03-06 HISTORY — PX: COLONOSCOPY WITH PROPOFOL: SHX5780

## 2022-03-06 HISTORY — PX: POLYPECTOMY: SHX5525

## 2022-03-06 HISTORY — PX: ESOPHAGOGASTRODUODENOSCOPY (EGD) WITH PROPOFOL: SHX5813

## 2022-03-06 LAB — GLUCOSE, CAPILLARY: Glucose-Capillary: 198 mg/dL — ABNORMAL HIGH (ref 70–99)

## 2022-03-06 SURGERY — COLONOSCOPY WITH PROPOFOL
Anesthesia: General

## 2022-03-06 MED ORDER — GLUCAGON HCL RDNA (DIAGNOSTIC) 1 MG IJ SOLR
INTRAMUSCULAR | Status: DC | PRN
  Administered 2022-03-06 (×2): .5 mg via INTRAVENOUS

## 2022-03-06 MED ORDER — STERILE WATER FOR IRRIGATION IR SOLN
Status: DC | PRN
  Administered 2022-03-06: .6 mL

## 2022-03-06 MED ORDER — LIDOCAINE HCL (CARDIAC) PF 100 MG/5ML IV SOSY
PREFILLED_SYRINGE | INTRAVENOUS | Status: DC | PRN
  Administered 2022-03-06: 50 mg via INTRATRACHEAL

## 2022-03-06 MED ORDER — PROPOFOL 10 MG/ML IV BOLUS
INTRAVENOUS | Status: DC | PRN
  Administered 2022-03-06: 10 mg via INTRAVENOUS
  Administered 2022-03-06: 110 mg via INTRAVENOUS

## 2022-03-06 MED ORDER — PROPOFOL 500 MG/50ML IV EMUL
INTRAVENOUS | Status: DC | PRN
  Administered 2022-03-06: 200 ug/kg/min via INTRAVENOUS

## 2022-03-06 MED ORDER — LACTATED RINGERS IV SOLN: INTRAVENOUS | Status: DC | PRN

## 2022-03-06 NOTE — Discharge Instructions (Addendum)
EGD Discharge instructions Please read the instructions outlined below and refer to this sheet in the next few weeks. These discharge instructions provide you with general information on caring for yourself after you leave the hospital. Your doctor may also give you specific instructions. While your treatment has been planned according to the most current medical practices available, unavoidable complications occasionally occur. If you have any problems or questions after discharge, please call your doctor. ACTIVITY You may resume your regular activity but move at a slower pace for the next 24 hours.  Take frequent rest periods for the next 24 hours.  Walking will help expel (get rid of) the air and reduce the bloated feeling in your abdomen.  No driving for 24 hours (because of the anesthesia (medicine) used during the test).  You may shower.  Do not sign any important legal documents or operate any machinery for 24 hours (because of the anesthesia used during the test).  NUTRITION Drink plenty of fluids.  You may resume your normal diet.  Begin with a light meal and progress to your normal diet.  Avoid alcoholic beverages for 24 hours or as instructed by your caregiver.  MEDICATIONS You may resume your normal medications unless your caregiver tells you otherwise.  WHAT YOU CAN EXPECT TODAY You may experience abdominal discomfort such as a feeling of fullness or "gas" pains.  FOLLOW-UP Your doctor will discuss the results of your test with you.  SEEK IMMEDIATE MEDICAL ATTENTION IF ANY OF THE FOLLOWING OCCUR: Excessive nausea (feeling sick to your stomach) and/or vomiting.  Severe abdominal pain and distention (swelling).  Trouble swallowing.  Temperature over 101 F (37.8 C).  Rectal bleeding or vomiting of blood.    Colonoscopy Discharge Instructions  Read the instructions outlined below and refer to this sheet in the next few weeks. These discharge instructions provide you with  general information on caring for yourself after you leave the hospital. Your doctor may also give you specific instructions. While your treatment has been planned according to the most current medical practices available, unavoidable complications occasionally occur. If you have any problems or questions after discharge, call Dr. Gala Romney at [phone]. ACTIVITY You may resume your regular activity, but move at a slower pace for the next 24 hours.  Take frequent rest periods for the next 24 hours.  Walking will help get rid of the air and reduce the bloated feeling in your belly (abdomen).  No driving for 24 hours (because of the medicine (anesthesia) used during the test).   Do not sign any important legal documents or operate any machinery for 24 hours (because of the anesthesia used during the test).  NUTRITION Drink plenty of fluids.  You may resume your normal diet as instructed by your doctor.  Begin with a light meal and progress to your normal diet. Heavy or fried foods are harder to digest and may make you feel sick to your stomach (nauseated).  Avoid alcoholic beverages for 24 hours or as instructed.  MEDICATIONS You may resume your normal medications unless your doctor tells you otherwise.  WHAT YOU CAN EXPECT TODAY Some feelings of bloating in the abdomen.  Passage of more gas than usual.  Spotting of blood in your stool or on the toilet paper.  IF YOU HAD POLYPS REMOVED DURING THE COLONOSCOPY: No aspirin products for 7 days or as instructed.  No alcohol for 7 days or as instructed.  Eat a soft diet for the next 24 hours.  FINDING  OUT THE RESULTS OF YOUR TEST Not all test results are available during your visit. If your test results are not back during the visit, make an appointment with your caregiver to find out the results. Do not assume everything is normal if you have not heard from your caregiver or the medical facility. It is important for you to follow up on all of your test  results.  SEEK IMMEDIATE MEDICAL ATTENTION IF: You have more than a spotting of blood in your stool.  Your belly is swollen (abdominal distention).  You are nauseated or vomiting.  You have a temperature over 101.  You have abdominal pain or discomfort that is severe or gets worse throughout the day.       You have a small hiatal hernia otherwise your stomach appeared normal    2 small polyps removed in your colon.    Colon polyp and diverticulosis information provided                    no explanation for low blood counts found on today's procedures.    We will complete the GI evaluation with a video capsule study of your small intestine which involves             swallowing miniature camera in the form of a capsule to take pictures of your small intestine.          We will arrange that in the near future.           No oral iron products until this procedure is been accomplished    at patient request, I called Estill Bamberg at  [phone] findings and recommendations   May resume Plavix today.

## 2022-03-06 NOTE — Telephone Encounter (Signed)
-----   Message from [name], MD sent at 03/06/2022 12:34 PM EST -----  EGD and colonoscopy completed today.  Really, no explanation for iron deficiency anemia down to a hemoglobin of 7 requiring transfusion.  Needs small bowel imaged ASAP prior to cardiac cath, etc.  I have asked the patient to hold his  oral iron.  I discussed video capsule study with the wife.  She is agreeable.  Can we get him triaged in the next 24 hours to bring him back within the next week for VCE of small intestine.  Thanks.

## 2022-03-06 NOTE — Op Note (Signed)
[facility] Patient Name: Jimmy Duran Procedure Date: 03/06/2022 11:08 AM MRN: [id number] Date of Birth: 1955-03-26 Attending MD: Norvel Richards , MD, [id number] CSN: [id number] Age: 67 Admit Type: Outpatient Procedure:                Upper GI endoscopy Indications:              Iron deficiency anemia, Occult blood in stool Providers:                Norvel Richards, MD, Charlsie Quest. Theda Sers RN, RN,                            [employer], Kristine L. Risa Grill,                            Technician Referring MD:             Norvel Richards, MD Medicines:                Propofol per Anesthesia Complications:            No immediate complications. Estimated Blood Loss:     Estimated blood loss: none. Estimated blood loss:                            none. Procedure:                Pre-Anesthesia Assessment:                           - Prior to the procedure, a History and Physical                            was performed, and patient medications and                            allergies were reviewed. The patient's tolerance of                            previous anesthesia was also reviewed. The risks                            and benefits of the procedure and the sedation                            options and risks were discussed with the patient.                            All questions were answered, and informed consent                            was obtained. Prior Anticoagulants: The patient has                            taken no anticoagulant or antiplatelet agents. ASA  Grade Assessment: III - A patient with severe                            systemic disease. After reviewing the risks and                            benefits, the patient was deemed in satisfactory                            condition to undergo the procedure.                           After obtaining informed consent, the endoscope was                             passed under direct vision. Throughout the                            procedure, the patient's blood pressure, pulse, and                            oxygen saturations were monitored continuously. The                            [id number]-H190 ([id number]) scope was introduced through the                            mouth, and advanced to the third part of duodenum.                            The upper GI endoscopy was accomplished without                            difficulty. The patient tolerated the procedure                            well. Scope In: 11:39:53 AM Scope Out: 11:44:12 AM Total Procedure Duration: 0 hours 4 minutes 19 seconds  Findings:      The examined esophagus was normal. Gastric cavity empty. A small hiatal       hernia was present. Normal-appearing gastric mucosa. Patent pylorus.      The duodenal bulb, second portion of the duodenum and third portion of       the duodenum were normal. Impression:               - Normal esophagus.                           - Small hiatal hernia. Otherwise, normal stomach.                           - Normal duodenal bulb, second portion of the                            duodenum and third portion of  the duodenum.                           - Moderate Sedation:      Moderate (conscious) sedation was personally administered by an       anesthesia professional. The following parameters were monitored: oxygen       saturation, heart rate, blood pressure, respiratory rate, EKG, adequacy       of pulmonary ventilation, and response to care. Recommendation:           - Continue present medications. See colonoscopy                            report. Further recommendations to follow. Procedure Code(s):        --- Professional ---                           [id number], Esophagogastroduodenoscopy, flexible,                            transoral; diagnostic, including collection of                            specimen(s) by brushing or washing, when  performed                            (separate procedure) Diagnosis Code(s):        --- Professional ---                           K44.9, Diaphragmatic hernia without obstruction or                            gangrene                           D50.9, Iron deficiency anemia, unspecified                           R19.5, Other fecal abnormalities CPT copyright 2022 American Medical Association. All rights reserved. The codes documented in this report are preliminary and upon coder review may  be revised to meet current compliance requirements. [name]. , MD Norvel Richards, MD 03/06/2022 12:18:03 PM This report has been signed electronically. Number of Addenda: 0

## 2022-03-06 NOTE — Anesthesia Preprocedure Evaluation (Signed)
Anesthesia Evaluation  Patient identified by MRN, date of birth, ID band Patient awake    Reviewed: Allergy & Precautions, NPO status , Patient's Chart, lab work & pertinent test results, reviewed documented beta blocker date and time   History of Anesthesia Complications Negative for: history of anesthetic complications  Airway Mallampati: III  TM Distance: >3 FB Neck ROM: Full    Dental  (+) Edentulous Lower, Upper Dentures   Pulmonary COPD,  COPD inhaler, Current SmokerPatient did not abstain from smoking.   Pulmonary exam normal        Cardiovascular hypertension, Pt. on medications and Pt. on home beta blockers + CAD and + Peripheral Vascular Disease  Normal cardiovascular exam   '23 Carotid US - 60-79% left ICAS, 1-39% right ICAS  '20 TTE - EF 60 to 65%. Moderately increased left ventricular hypertrophy. Mild aortic valve sclerosis without stenosis.     Neuro/Psych TIA negative psych ROS   GI/Hepatic Neg liver ROS,GERD  Medicated and Controlled,,  Endo/Other  diabetes, Poorly Controlled, Type 2, Oral Hypoglycemic Agents, Insulin Dependent    Renal/GU Renal InsufficiencyRenal disease     Musculoskeletal  (+) Arthritis , Osteoarthritis,    Abdominal   Peds  Hematology  On plavix    Anesthesia Other Findings   Reproductive/Obstetrics                              Anesthesia Physical Anesthesia Plan  ASA: 3  Anesthesia Plan: General   Post-op Pain Management: Tylenol PO (pre-op)*   Induction: Intravenous  PONV Risk Score and Plan: 1  Airway Management Planned: Nasal Cannula and Natural Airway  Additional Equipment: Arterial line  Intra-op Plan:   Post-operative Plan:   Informed Consent: I have reviewed the patients History and Physical, chart, labs and discussed the procedure including the risks, benefits and alternatives for the proposed anesthesia with the patient or  authorized representative who has indicated his/her understanding and acceptance.       Plan Discussed with: CRNA  Anesthesia Plan Comments:          Anesthesia Quick Evaluation

## 2022-03-06 NOTE — Transfer of Care (Signed)
Immediate Anesthesia Transfer of Care Note  Patient: Jimmy Duran [facility]  Procedure(s) Performed: COLONOSCOPY WITH PROPOFOL ESOPHAGOGASTRODUODENOSCOPY (EGD) WITH PROPOFOL POLYPECTOMY  Patient Location: PACU  Anesthesia Type:General  Level of Consciousness: awake, alert , and oriented  Airway & Oxygen Therapy: Patient Spontanous Breathing  Post-op Assessment: Report given to RN and Post -op Vital signs reviewed and stable  Post vital signs: Reviewed and stable  Last Vitals:  Vitals Value Taken Time  BP    Temp    Pulse    Resp    SpO2      Last Pain:  Vitals:   03/06/22 1136  TempSrc:   PainSc: 0-No pain      Patients Stated Pain Goal: 10 (56/97/94 8016)  Complications: No notable events documented.

## 2022-03-06 NOTE — Anesthesia Postprocedure Evaluation (Signed)
Anesthesia Post Note  Patient: Jimmy Duran  Procedure(s) Performed: COLONOSCOPY WITH PROPOFOL ESOPHAGOGASTRODUODENOSCOPY (EGD) WITH PROPOFOL POLYPECTOMY  Patient location during evaluation: PACU Anesthesia Type: General Level of consciousness: awake and alert Pain management: pain level controlled Vital Signs Assessment: post-procedure vital signs reviewed and stable Respiratory status: spontaneous breathing, nonlabored ventilation, respiratory function stable and patient connected to nasal cannula oxygen Cardiovascular status: blood pressure returned to baseline and stable Postop Assessment: no apparent nausea or vomiting Anesthetic complications: no   There were no known notable events for this encounter.   Last Vitals:  Vitals:   03/06/22 1223 03/06/22 1226  BP: (!) 108/93 118/75  Pulse: 88 85  Resp: 20 16  Temp: 36.7 C   SpO2: 96% 97%    Last Pain:  Vitals:   03/06/22 1230  TempSrc:   PainSc: 0-No pain                 Nicanor Alcon

## 2022-03-06 NOTE — Telephone Encounter (Signed)
PA submitted via cohere. PA pending review. Clinicals uploaded. Tracking [id number]

## 2022-03-06 NOTE — Interval H&P Note (Signed)
History and Physical Interval Note:  03/06/2022 11:34 AM  Jimmy Duran  has presented today for surgery, with the diagnosis of SURVEILLANCE,CHRONIC IDA.  The various methods of treatment have been discussed with the patient and family. After consideration of risks, benefits and other options for treatment, the patient has consented to  Procedure(s) with comments: COLONOSCOPY WITH PROPOFOL (N/A) - 12:30 PM ESOPHAGOGASTRODUODENOSCOPY (EGD) WITH PROPOFOL (N/A) as a surgical intervention.  The patient's history has been reviewed, patient examined, no change in status, stable for surgery.  I have reviewed the patient's chart and labs.  Questions were answered to the patient's satisfaction.          Significant change in status since seen in the office 12/5.  Admitted Cohen with STEMI.  Progressive iron deficiency anemia occult blood positive.  Received 2 units of packed RBCs.  Cardiology plans catheterization but needs GI evaluation prior to this.  History of BC powder use.  On a baby aspirin once daily.  Plavix has been held.  Dysphagia resolved since his esophageal stenosis with dilated a year ago.  He was slated to have a colonoscopy alone today for history of adenoma.  He was seen briefly by [facility] down in [place].  He denies any chest pain recently.  Due to progressive iron insufficiency anemia Hemoccult positive stool he is undergoing both an EGD and a colonoscopy today.  Denies dysphagia..  The risks, benefits, limitations, imponderables and alternatives regarding both EGD and colonoscopy have been reviewed with the patient. Questions have been answered. All parties agreeable.

## 2022-03-06 NOTE — Op Note (Signed)
[facility] Patient Name: Jimmy Duran Procedure Date: 03/06/2022 11:46 AM MRN: [id number] Date of Birth: 1955/12/08 Attending MD: Norvel Richards , MD, [id number] CSN: [id number] Age: 67 Admit Type: Outpatient Procedure:                Colonoscopy Indications:              Iron deficiency anemia Providers:                Norvel Richards, MD, Janeece Riggers, RN, Lambert Mody, Charlsie Quest Insurance claims handler, Therapist, sports, Suzan Garibaldi.                            Risa Grill, Technician Referring MD:             Norvel Richards, MD Medicines:                Propofol per Anesthesia Complications:            No immediate complications. Estimated Blood Loss:     Estimated blood loss was minimal. Estimated blood                            loss was minimal. Procedure:                Pre-Anesthesia Assessment:                           - Prior to the procedure, a History and Physical                            was performed, and patient medications and                            allergies were reviewed. The patient's tolerance of                            previous anesthesia was also reviewed. The risks                            and benefits of the procedure and the sedation                            options and risks were discussed with the patient.                            All questions were answered, and informed consent                            was obtained. Prior Anticoagulants: The patient has                            taken no anticoagulant or antiplatelet agents. ASA  Grade Assessment: III - A patient with severe                            systemic disease. After reviewing the risks and                            benefits, the patient was deemed in satisfactory                            condition to undergo the procedure.                           After obtaining informed consent, the colonoscope                             was passed under direct vision. Throughout the                            procedure, the patient's blood pressure, pulse, and                            oxygen saturations were monitored continuously. The                            [id number]) scope was introduced through the                            anus and advanced to the 5 cm into the ileum. The                            colonoscopy was performed without difficulty. The                            patient tolerated the procedure well. The quality                            of the bowel preparation was adequate. The entire                            colon was well visualized. Scope In: 11:49:17 AM Scope Out: 12:15:47 PM Scope Withdrawal Time: 0 hours 23 minutes 53 seconds  Total Procedure Duration: 0 hours 26 minutes 30 seconds  Findings:      The perianal and digital rectal examinations were normal.      Two semi-pedunculated polyps were found in the colon. The polyps were 5       to 7 mm in size. These polyps were removed with a cold snare. Resection       and retrieval were complete. Estimated blood loss was minimal.      Scattered small-mouthed diverticula were found in the entire colon.      The exam was otherwise without abnormality on direct and retroflexion       views. Impression:               - Two 5 to 7 mm polyps, removed with  a cold snare.                            Resected and retrieved.                           - Diverticulosis in the entire examined colon.                           - The examination was otherwise normal on direct                            and retroflexion views.                           No endoscopic explanation found in either patient's                            stomach or colon to explain degree of iron                            deficiency anemia from blood loss requiring                            transfusion.. Could simply be slow diffuse GI blood                            loss  from aspirin containing powders. To complete                            the GI evaluation, I recommend he return for video                            capsule study of the small intestine in the very                            near future. That will be arranged. Moderate Sedation:      Moderate (conscious) sedation was personally administered by an       anesthesia professional. The following parameters were monitored: oxygen       saturation, heart rate, blood pressure, respiratory rate, EKG, adequacy       of pulmonary ventilation, and response to care. Recommendation:           - Patient has a contact number available for                            emergencies. The signs and symptoms of potential                            delayed complications were discussed with the                            patient. Return to normal activities tomorrow.  Written discharge instructions were provided to the                            patient.                           - Advance diet as tolerated.                           - Repeat colonoscopy date to be determined after                            pending pathology results are reviewed for                            surveillance.                           - Return to GI office (date not yet determined). Procedure Code(s):        --- Professional ---                           [phone], Colonoscopy, flexible; with removal of                            tumor(s), polyp(s), or other lesion(s) by snare                            technique Diagnosis Code(s):        --- Professional ---                           D12.6, Benign neoplasm of colon, unspecified                           D50.9, Iron deficiency anemia, unspecified                           K57.30, Diverticulosis of large intestine without                            perforation or abscess without bleeding CPT copyright 2022 American Medical Association. All rights  reserved. The codes documented in this report are preliminary and upon coder review may  be revised to meet current compliance requirements. [name]. , MD Norvel Richards, MD 03/06/2022 74:94:49 PM This report has been signed electronically. Number of Addenda: 0

## 2022-03-07 ENCOUNTER — Ambulatory Visit (INDEPENDENT_AMBULATORY_CARE_PROVIDER_SITE_OTHER): Payer: Medicare HMO | Admitting: Internal Medicine

## 2022-03-07 ENCOUNTER — Encounter: Payer: Self-pay | Admitting: Internal Medicine

## 2022-03-07 ENCOUNTER — Telehealth: Payer: Self-pay | Admitting: Cardiology

## 2022-03-07 VITALS — BP 111/68 | HR 93 | Ht 67.0 in | Wt 139.6 lb

## 2022-03-07 DIAGNOSIS — E1159 Type 2 diabetes mellitus with other circulatory complications: Secondary | ICD-10-CM

## 2022-03-07 DIAGNOSIS — D509 Iron deficiency anemia, unspecified: Secondary | ICD-10-CM

## 2022-03-07 DIAGNOSIS — I2510 Atherosclerotic heart disease of native coronary artery without angina pectoris: Secondary | ICD-10-CM

## 2022-03-07 DIAGNOSIS — D62 Acute posthemorrhagic anemia: Secondary | ICD-10-CM

## 2022-03-07 DIAGNOSIS — Z09 Encounter for follow-up examination after completed treatment for conditions other than malignant neoplasm: Secondary | ICD-10-CM

## 2022-03-07 DIAGNOSIS — I214 Non-ST elevation (NSTEMI) myocardial infarction: Secondary | ICD-10-CM

## 2022-03-07 DIAGNOSIS — I10 Essential (primary) hypertension: Secondary | ICD-10-CM

## 2022-03-07 LAB — SURGICAL PATHOLOGY

## 2022-03-07 NOTE — Telephone Encounter (Signed)
May cancel echo appointment 03/13/22.  Need hospital f/u visit scheduled asap with APP

## 2022-03-07 NOTE — Patient Instructions (Signed)
It was a pleasure to see you today.  Thank you for giving [name] the opportunity to be involved in your care.  Below is a brief recap of your visit and next steps.  We will plan to see you again in 4 weeks.  Summary NO medication changes today We will repeat labs Follow up in 4 weeks I recommend contacting your cardiologist to ask about need for echo scheduled for next week Please keep a blood sugar log to review at your next appointment

## 2022-03-07 NOTE — Telephone Encounter (Signed)
Wife called to say that patient had heart attack and was in the hospital. While in the hospital he had a echo done. Calling to see if the echo that on 1/10 is still needed. And if he needs to come have a f/u from his heart attack. Please advise

## 2022-03-07 NOTE — Progress Notes (Signed)
Established Patient Office Visit  Subjective   Patient ID: FENRIS CAUBLE, male    DOB: Mar 21, 1955  Age: 67 y.o. MRN: [id number]  Chief Complaint  Patient presents with   Transitions Of Care    D/c 02/25/2022   Hypertension    Follow up   Diabetes    Follow up   Mr. Jabbour presents today for a transitions of care appointment.  He was last seen by me on 12/5 at which time lisinopril was decreased to 5 mg daily and Mounjaro 2.5 mg weekly was prescribed.  4-week follow-up was arranged.  The interim, Mr. Provence presented to [facility] on 12/23 endorsing central chest pain.  He was subsequently admitted meeting criteria for NSTEMI and found to have acute on chronic iron deficiency anemia.  He was evaluated by cardiology and gastroenterology.  His NSTEMI was felt to be secondary to acute on chronic anemia.  GI had planned for endoscopic evaluation, however Mr. Brion Aliment left AMA prior to completion of this procedure.  GI follow-up was arranged and he subsequently underwent EGD/colonoscopy on 1/3.  This did not reveal any acute source of bleeding.  Today Mr. Schofield states that he feels much better than he did during his recent hospital admission.  He has not noticed any melena/hematochezia.  He has not had any recurrence of chest pain.  He has cardiology follow-up scheduled for 1/8 and reports that GI is planning for PillCam evaluation 1/10.  He has no acute concerns to discuss today.  Past Medical History:  Diagnosis Date   AAA (abdominal aortic aneurysm) (HCC)    Arthritis    Carotid artery disease (HCC)    Colon polyp    COPD (chronic obstructive pulmonary disease) (HCC)    Coronary artery disease    Nonobstructive   Essential hypertension    GERD (gastroesophageal reflux disease)    Hemorrhoids    History of TIA (transient ischemic attack)    Low back pain    Lumbar radiculopathy    Mixed hyperlipidemia    Palpitations    Peripheral vascular disease ([place])    Shingles  09/23/2019   Tobacco dependence    Type 2 diabetes mellitus ([place])    Past Surgical History:  Procedure Laterality Date   APPENDECTOMY  1970   BIOPSY  12/08/2017   Procedure: BIOPSY;  Surgeon: [name], MD;  [facility];  Service: Endoscopy;;  esophagus    BIOPSY  12/28/2020   Procedure: BIOPSY;  Surgeon: [name], MD;  [facility];  Service: Endoscopy;;   BIOPSY  07/12/2021   Procedure: BIOPSY;  Surgeon: [name], MD;  [facility];  Service: Endoscopy;;   BREAST SURGERY Right    Cyst resection on the right   CARDIAC CATHETERIZATION  1990's   in [place]. no stent placement   CATARACT EXTRACTION W/PHACO Right 06/18/2016   Procedure: CATARACT EXTRACTION PHACO AND INTRAOCULAR LENS PLACEMENT (Export);  Surgeon: [name], MD;  Location: [facility];  Service: Ophthalmology;  Laterality: Right;  [name] 35.7 AP% 16.6 CDE 5.94 Fluid pack lot # [id number]   CATARACT EXTRACTION W/PHACO Left 07/16/2016   Procedure: CATARACT EXTRACTION PHACO AND INTRAOCULAR LENS PLACEMENT (IOC);  Surgeon: [name], MD;  Location: [facility];  Service: Ophthalmology;  Laterality: Left;  [name] 51.8 AP% 12.7 CDE 6.58 Fluid Pack Lot # [id number]   CHOLECYSTECTOMY  1993   COLONOSCOPY  02/12/2007   [id number] friable anal canal hemorrhoids, otherwise  normal rectum and colon   COLONOSCOPY N/A 07/16/2012   RMR: Colonic diverticulosis. 4 mm tubular adenoma   COLONOSCOPY WITH PROPOFOL N/A 12/08/2017   Dr. Gala Romney: sigmoid and descending colon diverticulosis, transverse colon polyp (TUBULAR ADENOMA)   COLONOSCOPY WITH PROPOFOL N/A 03/06/2022   Procedure: COLONOSCOPY WITH PROPOFOL;  Surgeon: [name], MD;  [facility];  Service: Endoscopy;  Laterality: N/A;  12:30 PM   ENDARTERECTOMY Right 10/08/2019   Procedure: RIGHT CAROTID ENDARTERECTOMY;  Surgeon: Rosetta Posner, MD;  Location: [facility];  Service: Vascular;  Laterality: Right;    ESOPHAGOGASTRODUODENOSCOPY (EGD) WITH ESOPHAGEAL DILATION N/A 07/16/2012   RMR: +Candida esophagitis, Erosive reflux esophagiits. Schatizi's ring status post dilation. Hiatal hernia. Antral erosions status post biopsy.    ESOPHAGOGASTRODUODENOSCOPY (EGD) WITH PROPOFOL N/A 12/08/2017   Dr. Gala Romney: esophagitis, query EOE but negative for increased eosinophils on path, small hiatal hernia, normal stomach, normal duodenum, PAS stain with rare yeast forms on stain. Empiric dilation   ESOPHAGOGASTRODUODENOSCOPY (EGD) WITH PROPOFOL N/A 12/28/2020   esophagitis without bleeding, s/p biopsy and dilation. Antral erosions s/p biopsy.   ESOPHAGOGASTRODUODENOSCOPY (EGD) WITH PROPOFOL N/A 07/12/2021   Surgeon: [name], MD;  Cobblestoning appearance of distal esophagus, slight narrowing of distal esophagus s/p dilation with 70 Fr and biopsy, gastric erosions biopsied, normal examined duodenum.  Esophageal biopsy with reactive squamous mucosa, gastric biopsy with reactive gastropathy, negative for H. pylori.   ESOPHAGOGASTRODUODENOSCOPY (EGD) WITH PROPOFOL N/A 03/06/2022   Procedure: ESOPHAGOGASTRODUODENOSCOPY (EGD) WITH PROPOFOL;  Surgeon: [name], MD;  [facility];  Service: Endoscopy;  Laterality: N/A;   EYE SURGERY Bilateral 2018   cataract   HERNIA REPAIR     JOINT REPLACEMENT     KNEE SURGERY     Left knee arthroscopy torn medial meniscus grade 4 chondral changes medial femoral condyle tibial plateau   MALONEY DILATION N/A 12/08/2017   Procedure: MALONEY DILATION;  Surgeon: [name], MD;  [facility];  Service: Endoscopy;  Laterality: N/A;   MALONEY DILATION N/A 12/28/2020   Procedure: Venia Minks DILATION;  Surgeon: [name], MD;  [facility];  Service: Endoscopy;  Laterality: N/A;   MALONEY DILATION N/A 07/12/2021   Procedure: Venia Minks DILATION;  Surgeon: [name], MD;  [facility];  Service: Endoscopy;  Laterality: N/A;    PATCH ANGIOPLASTY Right 10/08/2019   Procedure: PATCH ANGIOPLASTY of the right common carotid artery using hemashield plaltinum finesse patch;  Surgeon: Rosetta Posner, MD;  Location: [place] [facility];  Service: Vascular;  Laterality: Right;   PERIPHERAL VASCULAR INTERVENTION  07/17/2020   Procedure: PERIPHERAL VASCULAR INTERVENTION;  Surgeon: Waynetta Sandy, MD;  Location: [place] [facility];  Service: Cardiovascular;;   POLYPECTOMY  12/08/2017   Procedure: POLYPECTOMY;  Surgeon: [name], MD;  [facility];  Service: Endoscopy;;  colon   POLYPECTOMY  03/06/2022   Procedure: POLYPECTOMY;  Surgeon: [name], MD;  [facility];  Service: Endoscopy;;   TOTAL KNEE ARTHROPLASTY Left 04/27/2012   Procedure: TOTAL KNEE ARTHROPLASTY;  Surgeon: Carole Civil, MD;  Location: [facility];  Service: Orthopedics;  Laterality: Left;   TRANSCAROTID ARTERY REVASCULARIZATION  Left 10/12/2021   Procedure: Left Transcarotid Artery Revascularization;  Surgeon: Waynetta Sandy, MD;  Location: [place];  Service: Vascular;  Laterality: Left;   [place] N/A 07/17/2020   Procedure: mesenteric ANGIOGRAPHY;  Surgeon:  Waynetta Sandy, MD;  Location: [place] [facility];  Service: Cardiovascular;  Laterality: N/A;   Social History   Tobacco Use   Smoking status: Every Day    Packs/day: 0.50    Years: 53.00    Total pack years: 26.50    Types: Cigarettes   Smokeless tobacco: Former    Types: Chew    Quit date: 03/18/2018   Tobacco comments:    0.5 pk per day currently   Vaping Use   Vaping Use: Former  Substance Use Topics   Alcohol use: Yes    Comment: socially "beer and tomato juice"    Drug use: No   Family History  Problem Relation Age of Onset   Dementia Mother    Hypertension Mother    Colon polyps Mother    Osteoporosis Mother    Cancer Father    Hypertension Father    Coronary artery disease Father         CABG in his 84's   Heart attack Father    Cancer - Lung Father    Heart disease Brother        has a pacemaker   Emphysema Maternal Grandmother    Cancer - Lung Maternal Grandfather    Cancer Paternal Grandmother    Heart attack Paternal Grandfather    Colon cancer Other        maternal great aunt   Colon cancer Other        paternal great aunt   No Known Allergies    Review of Systems  Constitutional:  Positive for malaise/fatigue.  Respiratory:  Negative for shortness of breath.   Cardiovascular:  Negative for chest pain.  All other systems reviewed and are negative.     Objective:     BP 111/68   Pulse 93   Ht '5\' 7"'$  (1.702 m)   Wt 139 lb 9.6 oz (63.3 kg)   SpO2 94%   BMI 21.86 kg/m  BP Readings from Last 3 Encounters:  03/11/22 115/82  03/07/22 111/68  03/06/22 118/75      Physical Exam Vitals reviewed.  Constitutional:      General: He is not in acute distress.    Appearance: Normal appearance. He is not ill-appearing.  HENT:     Head: Normocephalic and atraumatic.     Right Ear: External ear normal.     Left Ear: External ear normal.     Nose: Nose normal. No congestion or rhinorrhea.     Mouth/Throat:     Mouth: Mucous membranes are moist.     Pharynx: Oropharynx is clear.  Eyes:     General: No scleral icterus.    Extraocular Movements: Extraocular movements intact.     Conjunctiva/sclera: Conjunctivae normal.     Pupils: Pupils are equal, round, and reactive to light.  Cardiovascular:     Rate and Rhythm: Normal rate and regular rhythm.     Pulses: Normal pulses.     Heart sounds: Murmur heard.  Pulmonary:     Effort: Pulmonary effort is normal.     Breath sounds: Normal breath sounds. No wheezing, rhonchi or rales.  Abdominal:     General: Abdomen is flat. Bowel sounds are normal. There is no distension.     Palpations: Abdomen is soft.     Tenderness: There is no abdominal tenderness.  Musculoskeletal:        General: No  swelling or deformity. Normal range of motion.     Cervical  back: Normal range of motion.  Skin:    General: Skin is warm and dry.     Capillary Refill: Capillary refill takes less than 2 seconds.  Neurological:     General: No focal deficit present.     Mental Status: He is alert and oriented to person, place, and time.     Motor: No weakness.  Psychiatric:        Mood and Affect: Mood normal.        Behavior: Behavior normal.        Thought Content: Thought content normal.    Last CBC Lab Results  Component Value Date   WBC 8.1 03/07/2022   HGB 11.2 (L) 03/07/2022   HCT 36.6 (L) 03/07/2022   MCV 83 03/07/2022   MCH 25.4 (L) 03/07/2022   RDW 16.8 (H) 03/07/2022   PLT 371 17/00/1749   Last metabolic panel Lab Results  Component Value Date   GLUCOSE 229 (H) 03/07/2022   NA 138 03/07/2022   K 4.1 03/07/2022   CL 96 03/07/2022   CO2 21 03/07/2022   BUN 5 (L) 03/07/2022   CREATININE 0.81 03/07/2022   GFRNONAA >60 02/25/2022   CALCIUM 9.2 03/07/2022   PROT 6.3 03/07/2022   ALBUMIN 4.1 03/07/2022   LABGLOB 2.2 03/07/2022   AGRATIO 1.9 03/07/2022   BILITOT <0.2 03/07/2022   ALKPHOS 96 03/07/2022   AST 20 03/07/2022   ALT 20 03/07/2022   ANIONGAP 12 02/25/2022   Last lipids Lab Results  Component Value Date   CHOL 92 02/25/2022   HDL 37 (L) 02/25/2022   LDLCALC 9 02/25/2022   TRIG 230 (H) 02/25/2022   CHOLHDL 2.5 02/25/2022   Last hemoglobin A1c Lab Results  Component Value Date   HGBA1C 9.6 (H) 12/11/2021   Last thyroid functions Lab Results  Component Value Date   TSH 1.790 10/18/2013     Assessment & Plan:   Problem List Items Addressed This Visit       DM type 2 causing vascular disease ([place])    Mounjaro was prescribed at his last appointment, however he has not been able to fill the prescription due to insurance coverage issues.  His last A1c was 9.6 in October.  He is currently prescribed Lantus 18 units nightly and NovoLog 14 units 3 times  daily with meals.  He remains on Jardiance 25 mg daily and metformin 1000 mg twice daily as well. -Follow-up in 1 month for DM.   -Repeat A1c at that time -We will further investigate options for GLP-1 therapy      NSTEMI (non-ST elevated myocardial infarction) [facility]) - Primary    Presented 12/23 endorsing central chest pain.  Troponin elevation noted.  No acute ECG changes appreciated.  NSTEMI ultimately was felt to be secondary to acute on chronic anemia.  Plavix is currently on hold.  He remains on metoprolol.  He will follow-up with cardiology on 1/8. -Continue ASA, metoprolol, and statin      Acute on chronic blood loss anemia    Underlying etiology of NSTEMI.  Baseline Hgb has been 10-11, was decreased to 8.4 at the time of his presentation on 12/23.  Previously treated by me for iron deficiency anemia.  He received 2 units PRBCs during his admission.  Hgb stabilized prior to discharge.  EGD/colonoscopy did not identify any acute source of bleeding.  He will follow-up for PillCam in the near future. -Plan for PillCam per GI -Repeat labs ordered today  Return in about 4 weeks (around 04/04/2022) for DM, IDA.    [name], MD

## 2022-03-07 NOTE — Telephone Encounter (Signed)
PA still pending.  

## 2022-03-08 NOTE — Telephone Encounter (Signed)
Checked site and PA still pending

## 2022-03-09 LAB — CBC WITH DIFFERENTIAL/PLATELET
Basophils Absolute: 0.2 10*3/uL (ref 0.0–0.2)
Basos: 2 %
EOS (ABSOLUTE): 0.3 10*3/uL (ref 0.0–0.4)
Eos: 4 %
Hematocrit: 36.6 % — ABNORMAL LOW (ref 37.5–51.0)
Hemoglobin: 11.2 g/dL — ABNORMAL LOW (ref 13.0–17.7)
Immature Grans (Abs): 0 10*3/uL (ref 0.0–0.1)
Immature Granulocytes: 0 %
Lymphocytes Absolute: 1.3 10*3/uL (ref 0.7–3.1)
Lymphs: 16 %
MCH: 25.4 pg — ABNORMAL LOW (ref 26.6–33.0)
MCHC: 30.6 g/dL — ABNORMAL LOW (ref 31.5–35.7)
MCV: 83 fL (ref 79–97)
Monocytes Absolute: 0.9 10*3/uL (ref 0.1–0.9)
Monocytes: 11 %
Neutrophils Absolute: 5.4 10*3/uL (ref 1.4–7.0)
Neutrophils: 67 %
Platelets: 371 10*3/uL (ref 150–450)
RBC: 4.41 x10E6/uL (ref 4.14–5.80)
RDW: 16.8 % — ABNORMAL HIGH (ref 11.6–15.4)
WBC: 8.1 10*3/uL (ref 3.4–10.8)

## 2022-03-09 LAB — IRON,TIBC AND FERRITIN PANEL
Ferritin: 23 ng/mL — ABNORMAL LOW (ref 30–400)
Iron Saturation: 5 % — CL (ref 15–55)
Iron: 19 ug/dL — ABNORMAL LOW (ref 38–169)
Total Iron Binding Capacity: 385 ug/dL (ref 250–450)
UIBC: 366 ug/dL — ABNORMAL HIGH (ref 111–343)

## 2022-03-09 LAB — CMP14+EGFR
ALT: 20 IU/L (ref 0–44)
AST: 20 IU/L (ref 0–40)
Albumin/Globulin Ratio: 1.9 (ref 1.2–2.2)
Albumin: 4.1 g/dL (ref 3.9–4.9)
Alkaline Phosphatase: 96 IU/L (ref 44–121)
BUN/Creatinine Ratio: 6 — ABNORMAL LOW (ref 10–24)
BUN: 5 mg/dL — ABNORMAL LOW (ref 8–27)
Bilirubin Total: <0.2 mg/dL (ref 0.0–1.2)
CO2: 21 mmol/L (ref 20–29)
Calcium: 9.2 mg/dL (ref 8.6–10.2)
Chloride: 96 mmol/L (ref 96–106)
Creatinine, Ser: 0.81 mg/dL (ref 0.76–1.27)
Globulin, Total: 2.2 g/dL (ref 1.5–4.5)
Glucose: 229 mg/dL — ABNORMAL HIGH (ref 70–99)
Potassium: 4.1 mmol/L (ref 3.5–5.2)
Sodium: 138 mmol/L (ref 134–144)
Total Protein: 6.3 g/dL (ref 6.0–8.5)
eGFR: 98 mL/min/{1.73_m2} (ref >59)

## 2022-03-11 ENCOUNTER — Encounter: Payer: Medicare HMO | Admitting: Nurse Practitioner

## 2022-03-11 ENCOUNTER — Encounter: Payer: Self-pay | Admitting: Nurse Practitioner

## 2022-03-11 VITALS — BP 115/82 | HR 98 | Ht 67.0 in | Wt 137.0 lb

## 2022-03-11 DIAGNOSIS — Z79899 Other long term (current) drug therapy: Secondary | ICD-10-CM | POA: Insufficient documentation

## 2022-03-11 DIAGNOSIS — I252 Old myocardial infarction: Secondary | ICD-10-CM | POA: Diagnosis not present

## 2022-03-11 DIAGNOSIS — Z8249 Family history of ischemic heart disease and other diseases of the circulatory system: Secondary | ICD-10-CM | POA: Insufficient documentation

## 2022-03-11 DIAGNOSIS — F17210 Nicotine dependence, cigarettes, uncomplicated: Secondary | ICD-10-CM | POA: Insufficient documentation

## 2022-03-11 DIAGNOSIS — Z8673 Personal history of transient ischemic attack (TIA), and cerebral infarction without residual deficits: Secondary | ICD-10-CM | POA: Insufficient documentation

## 2022-03-11 DIAGNOSIS — Z720 Tobacco use: Secondary | ICD-10-CM

## 2022-03-11 DIAGNOSIS — I2510 Atherosclerotic heart disease of native coronary artery without angina pectoris: Secondary | ICD-10-CM | POA: Insufficient documentation

## 2022-03-11 DIAGNOSIS — Z794 Long term (current) use of insulin: Secondary | ICD-10-CM | POA: Insufficient documentation

## 2022-03-11 DIAGNOSIS — E781 Pure hyperglyceridemia: Secondary | ICD-10-CM | POA: Insufficient documentation

## 2022-03-11 DIAGNOSIS — D500 Iron deficiency anemia secondary to blood loss (chronic): Secondary | ICD-10-CM | POA: Insufficient documentation

## 2022-03-11 DIAGNOSIS — E785 Hyperlipidemia, unspecified: Secondary | ICD-10-CM | POA: Insufficient documentation

## 2022-03-11 DIAGNOSIS — R002 Palpitations: Secondary | ICD-10-CM | POA: Insufficient documentation

## 2022-03-11 DIAGNOSIS — R42 Dizziness and giddiness: Secondary | ICD-10-CM

## 2022-03-11 DIAGNOSIS — I739 Peripheral vascular disease, unspecified: Secondary | ICD-10-CM | POA: Diagnosis not present

## 2022-03-11 DIAGNOSIS — K922 Gastrointestinal hemorrhage, unspecified: Secondary | ICD-10-CM

## 2022-03-11 DIAGNOSIS — E1151 Type 2 diabetes mellitus with diabetic peripheral angiopathy without gangrene: Secondary | ICD-10-CM | POA: Insufficient documentation

## 2022-03-11 DIAGNOSIS — R011 Cardiac murmur, unspecified: Secondary | ICD-10-CM | POA: Insufficient documentation

## 2022-03-11 DIAGNOSIS — J449 Chronic obstructive pulmonary disease, unspecified: Secondary | ICD-10-CM | POA: Insufficient documentation

## 2022-03-11 DIAGNOSIS — Z7984 Long term (current) use of oral hypoglycemic drugs: Secondary | ICD-10-CM | POA: Insufficient documentation

## 2022-03-11 MED ORDER — NITROGLYCERIN 0.4 MG SL SUBL: 0.4000 mg | SUBLINGUAL_TABLET | SUBLINGUAL | 3 refills | Status: AC | PRN

## 2022-03-11 MED ORDER — NICOTINE 21 MG/24HR TD PT24: 21.0000 mg | MEDICATED_PATCH | Freq: Every day | TRANSDERMAL | 0 refills | Status: DC

## 2022-03-11 MED ORDER — NICOTINE 7 MG/24HR TD PT24: 7.0000 mg | MEDICATED_PATCH | Freq: Every day | TRANSDERMAL | 0 refills | Status: DC

## 2022-03-11 MED ORDER — FENOFIBRATE 145 MG PO TABS: 145.0000 mg | ORAL_TABLET | Freq: Every day | ORAL | 3 refills | Status: DC

## 2022-03-11 MED ORDER — NICOTINE 14 MG/24HR TD PT24: 14.0000 mg | MEDICATED_PATCH | Freq: Every day | TRANSDERMAL | 0 refills | Status: DC

## 2022-03-11 NOTE — Telephone Encounter (Signed)
PA approved. Authorization #[id number] , DOS: 03/07/2022 - 05/06/2022

## 2022-03-11 NOTE — Progress Notes (Addendum)
Cardiology Office Note:    Date:  03/11/2022   ID:  Jimmy Duran, DOB 17-Jun-1955, MRN [id number]  PCP:  [name], MD   [facility] Cardiologist:  [name], MD     Referring MD: [name], MD   CC: Here for hospital follow-up  History of Present Illness:    Jimmy Duran is a 67 y.o. male with a hx of the following:  CAD Carotid stenosis Hypertension Type 2 diabetes History of TIA COPD GERD Tobacco abuse Palpitations, dizziness IDA  Patient is a delightful 67 year old male with past medical history as mentioned above.  Previous cardiovascular history includes nonobstructive CAD and low risk stress test in 2015  In 2021 he underwent right CEA, and underwent previous stenting to superior mesenteric artery in 2022.  More recently, was admitted to [facility] in August 2023 for arranged left TCAR, no immediate complications.  Presented back to [facility] ED on October 27, 2021 for worsening dizziness x 3 weeks.  Received IV fluids due to poor p.o. intake.  Lopressor was reduced and was recommended to follow-up with cardiology outpatient.  Last seen by [name], PA-C on November 22, 2021 for hospital follow-up.  At office visit that day he noted worsening lightheadedness x 1 month, not triggered with certain movements.  Said sometimes his legs feel weak and experienced occasional falls.  Denied any chest pain or palpitations, denied any shortness of breath.  Noted 40 pound weight loss over the past year, this was unintentional.  Was due for repeat colonoscopy in 2024.  Was going to follow-up with his PCP regarding this.  Orthostatics checked in office and found to have SBP dropped by 10 points.  It was recommended to stop hydrochlorothiazide and was given a prescription for lisinopril 20 mg.  Was told to monitor and log his BP.  Was told to follow-up in 3 months.  I last saw him on 02/21/2022 for 3 month follow up.  Noted  lightheaded since carotid surgery intermittently denied any vertigo symptoms. Was off lisinopril for a week for hypotension.  His symptoms improved significantly after stopping lisinopril.  Blood pressure averaging 117/68.  Did admit to occasional palpitations, nonsustained, not bothersome per his report. Denied chest pain. 14 day Zio and Echo were ordered. Labs were arranged. Was told to follow up in 2 months.   Was admitted with NSTEMI at [facility] on 02/23/2022. EKG showed mild ST with minimal ST depression in lateral leads < 1 mm. Serial trops 28 -> 150 (peak) in setting of demand ischemia due to anemia and heme positive stool. Received blood transfusions. Plavix held d/t hx of anemia/ GI bleed. Echo updated in hospital and revealed normal LV function. GI workup was favored before proceeding with invasive cardiac testing. ASA was continued and was instructed not to restart Plavix. Pt left AMA on 02/25/2022. Was told to follow up with GI and cardiology.   Underwent upper endoscopy and colonoscopy on 03/06/2022. Upper endoscopy showed hiatal hernia, but was otherwise normal.  Colonoscopy revealed 2 polyps, 5 and 7 mm in size that were removed, otherwise was unremarkable.   Today he presents for follow-up. States his GI doctor is arranging capsule endoscopy for patient for further review of where bleeding could be coming from. Overall he says he feels better after receiving the blood transfusion. Says hospital team was unable to do the heart cath because of his anemia. Plavix continues to be held. Denies any chest pain, shortness  of breath, syncope, presyncope, orthopnea, PND, swelling or significant weight changes, or claudication. Smokes 2 PPD, very motivated to quick smoking.  Has turned in his cardiac monitor that was arranged at last OV, results are not available at this time. Denies any other questions or concerns today.  Past Medical History:  Diagnosis Date   AAA (abdominal aortic aneurysm) (HCC)     Arthritis    Carotid artery disease (HCC)    Colon polyp    COPD (chronic obstructive pulmonary disease) (HCC)    Coronary artery disease    Nonobstructive   Essential hypertension    GERD (gastroesophageal reflux disease)    Hemorrhoids    History of TIA (transient ischemic attack)    Low back pain    Lumbar radiculopathy    Mixed hyperlipidemia    Palpitations    Peripheral vascular disease ([place])    Shingles 09/23/2019   Tobacco dependence    Type 2 diabetes mellitus ([place])     Past Surgical History:  Procedure Laterality Date   APPENDECTOMY  1970   BIOPSY  12/08/2017   Procedure: BIOPSY;  Surgeon: [name], MD;  Location: [facility];  Service: Endoscopy;;  esophagus    BIOPSY  12/28/2020   Procedure: BIOPSY;  Surgeon: [name], MD;  [facility];  Service: Endoscopy;;   BIOPSY  07/12/2021   Procedure: BIOPSY;  Surgeon: [name], MD;  [facility];  Service: Endoscopy;;   BREAST SURGERY Right    Cyst resection on the right   CARDIAC CATHETERIZATION  1990's   in [place]. no stent placement   CATARACT EXTRACTION W/PHACO Right 06/18/2016   Procedure: CATARACT EXTRACTION PHACO AND INTRAOCULAR LENS PLACEMENT ([place]);  Surgeon: [name], MD;  Location: [facility];  Service: Ophthalmology;  Laterality: Right;  [name] 35.7 AP% 16.6 CDE 5.94 Fluid pack lot # [id number]   CATARACT EXTRACTION W/PHACO Left 07/16/2016   Procedure: CATARACT EXTRACTION PHACO AND INTRAOCULAR LENS PLACEMENT (IOC);  Surgeon: [name], MD;  Location: [facility];  Service: Ophthalmology;  Laterality: Left;  [name] 51.8 AP% 12.7 CDE 6.58 Fluid Pack Lot # [id number]   CHOLECYSTECTOMY  1993   COLONOSCOPY  02/12/2007   XFG:[id number] friable anal canal hemorrhoids, otherwise normal rectum and colon   COLONOSCOPY N/A 07/16/2012   RMR: Colonic diverticulosis. 4 mm tubular adenoma   COLONOSCOPY WITH PROPOFOL N/A 12/08/2017   Dr. Gala Romney: sigmoid and descending  colon diverticulosis, transverse colon polyp (TUBULAR ADENOMA)   ENDARTERECTOMY Right 10/08/2019   Procedure: RIGHT CAROTID ENDARTERECTOMY;  Surgeon: Rosetta Posner, MD;  Location: [facility];  Service: Vascular;  Laterality: Right;   ESOPHAGOGASTRODUODENOSCOPY (EGD) WITH ESOPHAGEAL DILATION N/A 07/16/2012   RMR: +Candida esophagitis, Erosive reflux esophagiits. Schatizi's ring status post dilation. Hiatal hernia. Antral erosions status post biopsy.    ESOPHAGOGASTRODUODENOSCOPY (EGD) WITH PROPOFOL N/A 12/08/2017   Dr. Gala Romney: esophagitis, query EOE but negative for increased eosinophils on path, small hiatal hernia, normal stomach, normal duodenum, PAS stain with rare yeast forms on stain. Empiric dilation   ESOPHAGOGASTRODUODENOSCOPY (EGD) WITH PROPOFOL N/A 12/28/2020   esophagitis without bleeding, s/p biopsy and dilation. Antral erosions s/p biopsy.   ESOPHAGOGASTRODUODENOSCOPY (EGD) WITH PROPOFOL N/A 07/12/2021   Surgeon: [name], MD;  Cobblestoning appearance of distal esophagus, slight narrowing of distal esophagus s/p dilation with 35 Fr and biopsy, gastric erosions biopsied, normal examined duodenum.  Esophageal biopsy with reactive squamous mucosa, gastric biopsy with reactive gastropathy, negative for H.  pylori.   EYE SURGERY Bilateral 2018   cataract   HERNIA REPAIR     JOINT REPLACEMENT     KNEE SURGERY     Left knee arthroscopy torn medial meniscus grade 4 chondral changes medial femoral condyle tibial plateau   MALONEY DILATION N/A 12/08/2017   Procedure: MALONEY DILATION;  Surgeon: [name], MD;  [facility];  Service: Endoscopy;  Laterality: N/A;   MALONEY DILATION N/A 12/28/2020   Procedure: Venia Minks DILATION;  Surgeon: [name], MD;  [facility];  Service: Endoscopy;  Laterality: N/A;   MALONEY DILATION N/A 07/12/2021   Procedure: Venia Minks DILATION;  Surgeon: [name], MD;  [facility];  Service: Endoscopy;  Laterality:  N/A;   PATCH ANGIOPLASTY Right 10/08/2019   Procedure: PATCH ANGIOPLASTY of the right common carotid artery using hemashield plaltinum finesse patch;  Surgeon: Rosetta Posner, MD;  Location: [facility];  Service: Vascular;  Laterality: Right;   PERIPHERAL VASCULAR INTERVENTION  07/17/2020   Procedure: PERIPHERAL VASCULAR INTERVENTION;  Surgeon: Waynetta Sandy, MD;  Location: [facility] [facility];  Service: Cardiovascular;;   POLYPECTOMY  12/08/2017   Procedure: POLYPECTOMY;  Surgeon: [name], MD;  [facility];  Service: Endoscopy;;  colon   TOTAL KNEE ARTHROPLASTY Left 04/27/2012   Procedure: TOTAL KNEE ARTHROPLASTY;  Surgeon: Carole Civil, MD;  Location: [facility];  Service: Orthopedics;  Laterality: Left;   TRANSCAROTID ARTERY REVASCULARIZATION  Left 10/12/2021   Procedure: Left Transcarotid Artery Revascularization;  Surgeon: Waynetta Sandy, MD;  Location: [place];  Service: Vascular;  Laterality: Left;   [place] N/A 07/17/2020   Procedure: mesenteric ANGIOGRAPHY;  Surgeon: Waynetta Sandy, MD;  Location: [place] [facility];  Service: Cardiovascular;  Laterality: N/A;    Current Medications: Current Meds  Medication Sig   albuterol (VENTOLIN HFA) 108 (90 Base) MCG/ACT inhaler Inhale 2 puffs into the lungs every 6 (six) hours as needed for wheezing or shortness of breath.   aspirin EC 81 MG tablet Take 1 tablet (81 mg total) by mouth daily at 6 (six) AM. Swallow whole.   Continuous Blood Gluc Sensor (FREESTYLE LIBRE 3 SENSOR) MISC 1 each by Does not apply route every 14 (fourteen) days. Place 1 sensor on the skin every 14 days. Use to check glucose continuously   diphenhydrAMINE (BENADRYL) 25 MG tablet Take 25 mg by mouth daily as needed for allergies.    empagliflozin (JARDIANCE) 25 MG TABS tablet Take 1 tablet (25 mg total) by mouth daily before breakfast.   famotidine (PEPCID) 20 MG tablet Take 1  tablet (20 mg total) by mouth 2 (two) times daily.   fenofibrate (TRICOR) 145 MG tablet Take 1 tablet (145 mg total) by mouth daily.   Glucagon (GVOKE HYPOPEN 2-PACK) 1 MG/0.2ML SOAJ Inject 1 mg into the skin as needed (hypoglycemia).   insulin glargine (LANTUS) 100 UNIT/ML injection Inject 0.18 mLs (18 Units total) into the skin daily.   lansoprazole (PREVACID) 30 MG capsule Take 1 capsule (30 mg total) by mouth 2 (two) times daily before a meal.   metFORMIN (GLUCOPHAGE) 1000 MG tablet Take 1 tablet (1,000 mg total) by mouth 2 (two) times daily with a meal.   metoprolol tartrate (LOPRESSOR) 25 MG tablet Take 0.5 tablets (12.5 mg total) by mouth 2 (two) times daily.   nicotine (NICODERM CQ) 14 mg/24hr patch Place 1 patch (14 mg total)  onto the skin daily.   nicotine (NICODERM CQ) 21 mg/24hr patch Place 1 patch (21 mg total) onto the skin daily.   nicotine (NICODERM CQ) 7 mg/24hr patch Place 1 patch (7 mg total) onto the skin daily.   nitroGLYCERIN (NITROSTAT) 0.4 MG SL tablet Place 1 tablet (0.4 mg total) under the tongue every 5 (five) minutes as needed for chest pain (Do not excieed more than 3 tablets in 15 minutes.).   NOVOLOG 100 UNIT/ML injection Inject 14 Units into the skin 3 (three) times daily with meals.   rosuvastatin (CRESTOR) 40 MG tablet Take 1 tablet (40 mg total) by mouth daily.     Allergies:   Patient has no known allergies.   Social History   Socioeconomic History   Marital status: Legally Separated    Spouse name: Not on file   Number of children: 5   Years of education: Not on file   Highest education level: Not on file  Occupational History   Occupation: Disabled    Employer: [employer]  Tobacco Use   Smoking status: Every Day    Packs/day: 0.50    Years: 53.00    Total pack years: 26.50    Types: Cigarettes   Smokeless tobacco: Former    Types: Chew    Quit date: 03/18/2018   Tobacco comments:    0.5 pk per day currently   Vaping Use   Vaping  Use: Former  Substance and Sexual Activity   Alcohol use: Yes    Comment: socially "beer and tomato juice"    Drug use: No   Sexual activity: Yes    Birth control/protection: None  Other Topics Concern   Not on file  Social History Narrative   Lives alone.    Social Determinants of Radio broadcast assistant Strain: Not on file  Food Insecurity: Not on file  Transportation Needs: Not on file  Physical Activity: Not on file  Stress: Not on file  Social Connections: Not on file     Family History: The patient's family history includes Cancer in his father and paternal grandmother; Cancer - Lung in his father and maternal grandfather; Colon cancer in some other family members; Colon polyps in his mother; Coronary artery disease in his father; Dementia in his mother; Emphysema in his maternal grandmother; Heart attack in his father and paternal grandfather; Heart disease in his brother; Hypertension in his father and mother; Osteoporosis in his mother.  ROS:   Review of Systems  Constitutional: Negative.   HENT: Negative.    Eyes: Negative.   Respiratory: Negative.    Cardiovascular: Negative.   Gastrointestinal:  Positive for blood in stool. Negative for abdominal pain, constipation, diarrhea, heartburn, melena, nausea and vomiting.       See HPI.   Genitourinary: Negative.   Musculoskeletal: Negative.   Skin: Negative.   Neurological:  Positive for weakness.       See HPI.   Endo/Heme/Allergies: Negative.   Psychiatric/Behavioral: Negative.       Please see the history of present illness.    All other systems reviewed and are negative.  EKGs/Labs/Other Studies Reviewed:    The following studies were reviewed today:   EKG:  EKG is not ordered today.   Echocardiogram on 02/24/2022: 1. Left ventricular ejection fraction, by estimation, is 55 to 60%. The  left ventricle has normal function. The left ventricle has no regional  wall motion abnormalities. Left  ventricular diastolic parameters are  indeterminate.  2. Right ventricular systolic function is normal. The right ventricular  size is normal. Tricuspid regurgitation signal is inadequate for assessing  PA pressure.   3. The mitral valve is grossly normal. Trivial mitral valve  regurgitation. No evidence of mitral stenosis.   4. The aortic valve is tricuspid. Aortic valve regurgitation is mild. No  aortic stenosis is present.   5. The inferior vena cava is normal in size with greater than 50%  respiratory variability, suggesting right atrial pressure of 3 mmHg.   Conclusion(s)/Recommendation(s): Normal biventricular function without  evidence of hemodynamically significant valvular heart disease.  Cardiac monitor - results pending   Bilateral carotid duplex on 11/14/2021: Summary:  Right Carotid: Velocities in the right ICA are consistent with a 1-39%  stenosis.   Left Carotid: Patent left ICA stent with no evidence of restenosis.   Vertebrals:  Bilateral vertebral arteries demonstrate antegrade flow.  Subclavians: Normal flow hemodynamics were seen in bilateral subclavian               arteries.   Mesenteric limited on 08/21/2020: Summary:  Mesenteric:  70 to 99% stenosis in the superior mesenteric artery.  Unable to determine distal end of stent. Stenosis is either at the distal  end of  or distal to the stent.     Vascular US aorta/ IVC/ iliacs doppler on 09/28/2019: Summary:  Abdominal Aorta: The largest aortic measurement is 2.4 cm. Unable to  identify dissection of infrarenal aorta due to bowel gas.  The left common iliac artery measures approximately 2.25 cm x 2.33 cm,  based on limited visualization due to overlying bowel gas.  2D echocardiogram on 12/11/2018: 1. Left ventricular ejection fraction, by visual estimation, is 60 to  65%. The left ventricle has normal function. There is moderately increased  left ventricular hypertrophy.   2. Left ventricular  diastolic Doppler parameters are consistent with  impaired relaxation pattern of LV diastolic filling.   3. Global right ventricle has low normal systolic function.The right  ventricular size is normal. No increase in right ventricular wall  thickness.   4. Left atrial size was normal.   5. Right atrial size was normal.   6. Mild mitral annular calcification.   7. Mild to moderate aortic valve annular calcification.   8. The mitral valve is degenerative. No evidence of mitral valve  regurgitation.   9. The tricuspid valve is grossly normal. Tricuspid valve regurgitation  was not visualized by color flow Doppler.  10. The aortic valve is tricuspid Aortic valve regurgitation was not  visualized by color flow Doppler. Mild aortic valve sclerosis without  stenosis.  11. The pulmonic valve was not well visualized. Pulmonic valve  regurgitation is not visualized by color flow Doppler.  12. The inferior vena cava is normal in size with greater than 50%  respiratory variability, suggesting right atrial pressure of 3 mmHg.   Myoview on 04/23/2013: IMPRESSION:  1. Negative Lexi scan myocardial perfusion imaging stress test   2. Normal left ventricular ejection fraction with normal wall  motion.   3. Low risk study for major cardiovascular events.   Recent Labs: 03/07/2022: ALT 20; BUN 5; Creatinine, Ser 0.81; Hemoglobin 11.2; Platelets 371; Potassium 4.1; Sodium 138  Recent Lipid Panel    Component Value Date/Time   CHOL 92 02/25/2022 0149   CHOL 96 (L) 09/10/2021 0923   TRIG 230 (H) 02/25/2022 0149   HDL 37 (L) 02/25/2022 0149   HDL 34 (L) 09/10/2021 0923   CHOLHDL 2.5  02/25/2022 0149   VLDL 46 (H) 02/25/2022 0149   LDLCALC 9 02/25/2022 0149   LDLCALC 23 09/10/2021 0923    Physical Exam:    VS:  BP 115/82 (BP Location: Left Arm, Patient Position: Sitting, Cuff Size: Normal)   Pulse 98   Ht '5\' 7"'$  (1.702 m)   Wt 137 lb (62.1 kg)   SpO2 96%   BMI 21.46 kg/m     Wt Readings  from Last 3 Encounters:  03/11/22 137 lb (62.1 kg)  03/07/22 139 lb 9.6 oz (63.3 kg)  03/05/22 140 lb 6.9 oz (63.7 kg)     GEN: Thin, 67 y.o. male in no acute distress HEENT: Normal NECK: No JVD; No carotid bruits on exam CARDIAC: S1/S2, RRR, Grade 2/6 murmur noted on exam, rubs, gallops; 2+ peripheral pulses throughout, strong equal bilaterally RESPIRATORY:  Clear and diminished to auscultation without rales, wheezing or rhonchi  MUSCULOSKELETAL:  No edema; No deformity  SKIN: Warm and dry NEUROLOGIC:  Alert and oriented x 3 PSYCHIATRIC:  Normal affect   ASSESSMENT:    1. Coronary artery disease involving native heart without angina pectoris, unspecified vessel or lesion type   2. History of non-ST elevation myocardial infarction (NSTEMI)   3. Gastrointestinal hemorrhage, unspecified gastrointestinal hemorrhage type   4. PAD (peripheral artery disease) ([place])   5. Hyperlipidemia, unspecified hyperlipidemia type   6. Hypertriglyceridemia   7. Murmur   8. Intermittent lightheadedness   9. Palpitations   10. Tobacco abuse    PLAN:    In order of problems listed above:  CAD, s/p NSTEMI, GI bleed  Admitted 02/2022 with NSTEMI in setting of anemia d/t GI bleed with positive hemoccult. It was decided that could not undergo cardiac cath until anemia was resolved. Will await results of capsule endoscopy. Upper endoscopy and lower endoscopy were overall unremarkable and did not show source of bleeding. Stable with no anginal symptoms. No indication for ischemic evaluation at this time. Plavix currently on hold. Continue Lopressor and Rosuvastatin. Will write Rx for Nitroglycerin PRN. Heart healthy diet and regular cardiovascular exercise encouraged. ED precautions discussed. Continue to follow with PCP and GI.    Addendum 03/20/2022: Received message from Zeb Comfort that stated small bowel capsule study revealed "two nonbleeding small bowel AVMs noted, few nonbleeding erosions noted at  ileocecal valve, no masses, no active bleeding, no findings that would prohibit him from completing cardiac workup as necessary. She stated from a GI standpoint, he can resume ASA and Plavix.  Patient was called and updated, and he verbalized understanding.  At next office visit, plan to discuss noninvasive ischemic evaluation.  PAD He is s/p right CEA in 2021, stenting to SMA in 2022,as well as TCAR (L) in 10/2021. Continue current medication regimen, hold Plavix at this time. Follow-up with [facility]. Heart healthy diet and regular cardiovascular exercise encouraged.   HLD, hypertriglyceridemia Labs from 02/2022 revealed total cholesterol 92, HDL 37, LDL 9, and triglycerides 230.  Continue current medication regimen, will start Fenofibrate 145 mg daily, plan to recheck FLP and LFT in 2 months. Heart healthy diet and regular cardiovascular exercise encouraged.   Murmur Grade 2/6 systolic murmur noted on exam.  Last echocardiogram in 2020 revealed normal EF, no significant valvular abnormalities. Will update 2D echo at this time. Heart healthy diet and regular cardiovascular exercise encouraged.   Lightheadedness, Palpitations Improvement in lightheadedness since self-discontinuing Lisinopril and after receiving blood transfusion (see below). Denies any palpitations, 14 day Zio monitor results are pending  from previous visit. Heart healthy diet and regular cardiovascular exercise encouraged.   6. Tobacco abuse Smoking 2 PPD. Very motivated to quit smoking. Will write Rx for nicotine patch per protocol as mentioned below. Smoking cessation encouraged and discussed.   7. Disposition: Follow-up in 2 months or sooner if anything changes.     Medication Adjustments/Labs and Tests Ordered: Current medicines are reviewed at length with the patient today.  Concerns regarding medicines are outlined above.  Orders Placed This Encounter  Procedures   Lipid panel   Hepatic function panel   Meds ordered this  encounter  Medications   fenofibrate (TRICOR) 145 MG tablet    Sig: Take 1 tablet (145 mg total) by mouth daily.    Dispense:  90 tablet    Refill:  3   nitroGLYCERIN (NITROSTAT) 0.4 MG SL tablet    Sig: Place 1 tablet (0.4 mg total) under the tongue every 5 (five) minutes as needed for chest pain (Do not excieed more than 3 tablets in 15 minutes.).    Dispense:  25 tablet    Refill:  3   nicotine (NICODERM CQ) 21 mg/24hr patch    Sig: Place 1 patch (21 mg total) onto the skin daily.    Dispense:  30 patch    Refill:  0   nicotine (NICODERM CQ) 14 mg/24hr patch    Sig: Place 1 patch (14 mg total) onto the skin daily.    Dispense:  14 patch    Refill:  0   nicotine (NICODERM CQ) 7 mg/24hr patch    Sig: Place 1 patch (7 mg total) onto the skin daily.    Dispense:  14 patch    Refill:  0    Patient Instructions  Medication Instructions:  Your physician has recommended you make the following change in your medication:  - Start Fenofibrate 145 mg tablets once daily - Start SL Nitro 0.4 mg tablets as needed for Chest Pain.  - Start Nicoderm Patches - 21 mg patch once daily for 30 days -14 mg patch once daily for 14 days - 7 mg patch once daily for 14 days   Labwork: In 2 months: -Fasting Lipid Panel- nothing to eat/drink 6 hours prior to lab work LFT  Testing/Procedures: None  Follow-Up: Follow up with Finis Bud, NP in 1 month.   Any Other Special Instructions Will Be Listed Below (If Applicable).     If you need a refill on your cardiac medications before your next appointment, please call your pharmacy.   Steps to Quit Smoking Smoking tobacco is the leading cause of preventable death. It can affect almost every organ in the body. Smoking puts you and those around you at risk for developing many serious chronic diseases. Quitting smoking can be very challenging. Do not get discouraged if you are not successful the first time. Some people need to make many  attempts to quit before they achieve long-term success. Do your best to stick to your quit plan, and talk with your health care provider if you have any questions or concerns. How do I get ready to quit? When you decide to quit smoking, create a plan to help you succeed. Before you quit: Pick a date to quit. Set a date within the next 2 weeks to give you time to prepare. Write down the reasons why you are quitting. Keep this list in places where you will see it often. Tell your family, friends, and co-workers that you are quitting.  Support from people you are close to can make quitting easier. Talk with your health care provider about your options for quitting smoking. Find out what treatment options are covered by your health insurance. Identify people, places, things, and activities that make you want to smoke (triggers). Avoid them. What first steps can I take to quit smoking? Throw away all cigarettes at home, at work, and in your car. Throw away smoking accessories, such as Scientist, research (medical). Clean your car. Make sure to empty the ashtray. Clean your home, including curtains and carpets. What strategies can I use to quit smoking? Talk with your health care provider about combining strategies, such as taking medicines while you are also receiving in-person counseling. Using these two strategies together makes you more likely to succeed in quitting than if you used either strategy on its own. If you are pregnant or breastfeeding, talk with your health care provider about finding counseling or other support strategies to quit smoking. Do not take medicine to help you quit smoking unless your health care provider tells you to. Quit right away Quit smoking completely, instead of gradually reducing how much you smoke over a period of time. Stopping smoking right away may be more successful than gradually quitting. Attend in-person counseling to help you build problem-solving skills. You are  more likely to succeed in quitting if you attend counseling sessions regularly. Even short sessions of 10 minutes can be effective. Take medicine You may take medicines to help you quit smoking. Some medicines require a prescription. You can also purchase over-the-counter medicines. Medicines may have nicotine in them to replace the nicotine in cigarettes. Medicines may: Help to stop cravings. Help to relieve withdrawal symptoms. Your health care provider may recommend: Nicotine patches, gum, or lozenges. Nicotine inhalers or sprays. Non-nicotine medicine that you take by mouth. Find resources Find resources and support systems that can help you quit smoking and remain smoke-free after you quit. These resources are most helpful when you use them often. They include: Online chats with a Social worker. Telephone quitlines. Printed Furniture conservator/restorer. Support groups or group counseling. Text messaging programs. Mobile phone apps or applications. Use apps that can help you stick to your quit plan by providing reminders, tips, and encouragement. Examples of free services include Quit Guide from the CDC and smokefree.gov  What can I do to make it easier to quit?  Reach out to your family and friends for support and encouragement. Call telephone quitlines, such as 1-800-QUIT-NOW, reach out to support groups, or work with a counselor for support. Ask people who smoke to avoid smoking around you. Avoid places that trigger you to smoke, such as bars, parties, or smoke-break areas at work. Spend time with people who do not smoke. Lessen the stress in your life. Stress can be a smoking trigger for some people. To lessen stress, try: Exercising regularly. Doing deep-breathing exercises. Doing yoga. Meditating. What benefits will I see if I quit smoking? Over time, you should start to see positive results, such as: Improved sense of smell and taste. Decreased coughing and sore throat. Slower heart  rate. Lower blood pressure. Clearer and healthier skin. The ability to breathe more easily. Fewer sick days. Summary Quitting smoking can be very challenging. Do not get discouraged if you are not successful the first time. Some people need to make many attempts to quit before they achieve long-term success. When you decide to quit smoking, create a plan to help you succeed. Quit smoking right away, not  slowly over a period of time. Find resources and support systems that can help you quit smoking and remain smoke-free after you quit. This information is not intended to replace advice given to you by your health care provider. Make sure you discuss any questions you have with your health care provider. Document Revised: 02/09/2021 Document Reviewed: 02/09/2021 Elsevier Patient Education  [place], Finis Bud, [place]  03/11/2022 10:29 PM    [place]

## 2022-03-11 NOTE — Telephone Encounter (Signed)
Spoke with spouse. Pt scheduled for 1/10 at 7:30am. Instructions sent via mychart. Also still has not been taking oral iron as directed.

## 2022-03-11 NOTE — Telephone Encounter (Signed)
Called pt, LMOVM to call back to schedule ASAP per Dr. Gala Romney

## 2022-03-11 NOTE — Patient Instructions (Addendum)
Medication Instructions:  Your physician has recommended you make the following change in your medication:  - Start Fenofibrate 145 mg tablets once daily - Start SL Nitro 0.4 mg tablets as needed for Chest Pain.  - Start Nicoderm Patches - 21 mg patch once daily for 30 days -14 mg patch once daily for 14 days - 7 mg patch once daily for 14 days   Labwork: In 2 months: -Fasting Lipid Panel- nothing to eat/drink 6 hours prior to lab work LFT  Testing/Procedures: None  Follow-Up: Follow up with Finis Bud, NP in 1 month.   Any Other Special Instructions Will Be Listed Below (If Applicable).     If you need a refill on your cardiac medications before your next appointment, please call your pharmacy.   Steps to Quit Smoking Smoking tobacco is the leading cause of preventable death. It can affect almost every organ in the body. Smoking puts you and those around you at risk for developing many serious chronic diseases. Quitting smoking can be very challenging. Do not get discouraged if you are not successful the first time. Some people need to make many attempts to quit before they achieve long-term success. Do your best to stick to your quit plan, and talk with your health care provider if you have any questions or concerns. How do I get ready to quit? When you decide to quit smoking, create a plan to help you succeed. Before you quit: Pick a date to quit. Set a date within the next 2 weeks to give you time to prepare. Write down the reasons why you are quitting. Keep this list in places where you will see it often. Tell your family, friends, and co-workers that you are quitting. Support from people you are close to can make quitting easier. Talk with your health care provider about your options for quitting smoking. Find out what treatment options are covered by your health insurance. Identify people, places, things, and activities that make you want to smoke (triggers). Avoid  them. What first steps can I take to quit smoking? Throw away all cigarettes at home, at work, and in your car. Throw away smoking accessories, such as Scientist, research (medical). Clean your car. Make sure to empty the ashtray. Clean your home, including curtains and carpets. What strategies can I use to quit smoking? Talk with your health care provider about combining strategies, such as taking medicines while you are also receiving in-person counseling. Using these two strategies together makes you more likely to succeed in quitting than if you used either strategy on its own. If you are pregnant or breastfeeding, talk with your health care provider about finding counseling or other support strategies to quit smoking. Do not take medicine to help you quit smoking unless your health care provider tells you to. Quit right away Quit smoking completely, instead of gradually reducing how much you smoke over a period of time. Stopping smoking right away may be more successful than gradually quitting. Attend in-person counseling to help you build problem-solving skills. You are more likely to succeed in quitting if you attend counseling sessions regularly. Even short sessions of 10 minutes can be effective. Take medicine You may take medicines to help you quit smoking. Some medicines require a prescription. You can also purchase over-the-counter medicines. Medicines may have nicotine in them to replace the nicotine in cigarettes. Medicines may: Help to stop cravings. Help to relieve withdrawal symptoms. Your health care provider may recommend: Nicotine patches, gum, or  lozenges. Nicotine inhalers or sprays. Non-nicotine medicine that you take by mouth. Find resources Find resources and support systems that can help you quit smoking and remain smoke-free after you quit. These resources are most helpful when you use them often. They include: Online chats with a Social worker. Telephone quitlines. Printed  Furniture conservator/restorer. Support groups or group counseling. Text messaging programs. Mobile phone apps or applications. Use apps that can help you stick to your quit plan by providing reminders, tips, and encouragement. Examples of free services include Quit Guide from the CDC and smokefree.gov  What can I do to make it easier to quit?  Reach out to your family and friends for support and encouragement. Call telephone quitlines, such as 1-800-QUIT-NOW, reach out to support groups, or work with a counselor for support. Ask people who smoke to avoid smoking around you. Avoid places that trigger you to smoke, such as bars, parties, or smoke-break areas at work. Spend time with people who do not smoke. Lessen the stress in your life. Stress can be a smoking trigger for some people. To lessen stress, try: Exercising regularly. Doing deep-breathing exercises. Doing yoga. Meditating. What benefits will I see if I quit smoking? Over time, you should start to see positive results, such as: Improved sense of smell and taste. Decreased coughing and sore throat. Slower heart rate. Lower blood pressure. Clearer and healthier skin. The ability to breathe more easily. Fewer sick days. Summary Quitting smoking can be very challenging. Do not get discouraged if you are not successful the first time. Some people need to make many attempts to quit before they achieve long-term success. When you decide to quit smoking, create a plan to help you succeed. Quit smoking right away, not slowly over a period of time. Find resources and support systems that can help you quit smoking and remain smoke-free after you quit. This information is not intended to replace advice given to you by your health care provider. Make sure you discuss any questions you have with your health care provider. Document Revised: 02/09/2021 Document Reviewed: 02/09/2021 Elsevier Patient Education  [place].

## 2022-03-12 ENCOUNTER — Other Ambulatory Visit: Payer: Self-pay

## 2022-03-12 ENCOUNTER — Encounter (HOSPITAL_COMMUNITY): Payer: Self-pay | Admitting: Internal Medicine

## 2022-03-12 NOTE — Assessment & Plan Note (Addendum)
Underlying etiology of NSTEMI.  Baseline Hgb has been 10-11, was decreased to 8.4 at the time of his presentation on 12/23.  Previously treated by me for iron deficiency anemia.  He received 2 units PRBCs during his admission.  Hgb stabilized prior to discharge.  EGD/colonoscopy did not identify any acute source of bleeding.  He will follow-up for PillCam in the near future. -Plan for PillCam per GI -Repeat labs ordered today

## 2022-03-12 NOTE — Assessment & Plan Note (Signed)
Jimmy Duran was prescribed at his last appointment, however he has not been able to fill the prescription due to insurance coverage issues.  His last A1c was 9.6 in October.  He is currently prescribed Lantus 18 units nightly and NovoLog 14 units 3 times daily with meals.  He remains on Jardiance 25 mg daily and metformin 1000 mg twice daily as well. -Follow-up in 1 month for DM.   -Repeat A1c at that time -We will further investigate options for GLP-1 therapy

## 2022-03-12 NOTE — Assessment & Plan Note (Addendum)
Presented 12/23 endorsing central chest pain.  Troponin elevation noted.  No acute ECG changes appreciated.  NSTEMI ultimately was felt to be secondary to acute on chronic anemia.  Plavix is currently on hold.  He remains on metoprolol.  He will follow-up with cardiology on 1/8. -Continue ASA, metoprolol, and statin

## 2022-03-13 ENCOUNTER — Ambulatory Visit (HOSPITAL_COMMUNITY)
Admission: RE | Admit: 2022-03-13 | Discharge: 2022-03-13 | Disposition: A | Discharge status: Home / Self Care | Payer: Medicare HMO | Attending: Internal Medicine | Admitting: Internal Medicine

## 2022-03-13 ENCOUNTER — Encounter (HOSPITAL_COMMUNITY)
Admission: RE | Disposition: A | Discharge status: Home / Self Care | Payer: Self-pay | Source: Home / Self Care | Attending: Internal Medicine

## 2022-03-13 ENCOUNTER — Other Ambulatory Visit: Payer: Medicare HMO

## 2022-03-13 DIAGNOSIS — Z7982 Long term (current) use of aspirin: Secondary | ICD-10-CM | POA: Diagnosis not present

## 2022-03-13 DIAGNOSIS — Z79899 Other long term (current) drug therapy: Secondary | ICD-10-CM | POA: Insufficient documentation

## 2022-03-13 DIAGNOSIS — D509 Iron deficiency anemia, unspecified: Secondary | ICD-10-CM | POA: Insufficient documentation

## 2022-03-13 DIAGNOSIS — D500 Iron deficiency anemia secondary to blood loss (chronic): Secondary | ICD-10-CM

## 2022-03-13 DIAGNOSIS — Q2733 Arteriovenous malformation of digestive system vessel: Secondary | ICD-10-CM | POA: Diagnosis not present

## 2022-03-13 DIAGNOSIS — K633 Ulcer of intestine: Secondary | ICD-10-CM | POA: Diagnosis not present

## 2022-03-13 DIAGNOSIS — R195 Other fecal abnormalities: Secondary | ICD-10-CM

## 2022-03-13 DIAGNOSIS — I252 Old myocardial infarction: Secondary | ICD-10-CM | POA: Insufficient documentation

## 2022-03-13 HISTORY — PX: GIVENS CAPSULE STUDY: SHX5432

## 2022-03-13 SURGERY — IMAGING PROCEDURE, GI TRACT, INTRALUMINAL, VIA CAPSULE
Anesthesia: Monitor Anesthesia Care

## 2022-03-13 NOTE — Progress Notes (Signed)
Patient's wife called at [phone] and [phone] stating the light on the Givens was no longer blinking blue and that the patient had a bowel movement prior to the light changing colors. After the first call I instructed the patient to continue wearing the device as it had only been on an hour. After patient's wife called back a second time and stated the light was yellow, I instructed them to bring device back as the patient had passed the capsule. Dr. Gala Romney notified.

## 2022-03-14 ENCOUNTER — Encounter (HOSPITAL_COMMUNITY): Payer: Self-pay | Admitting: Internal Medicine

## 2022-03-14 ENCOUNTER — Other Ambulatory Visit: Payer: Self-pay

## 2022-03-14 ENCOUNTER — Telehealth: Payer: Self-pay | Admitting: Gastroenterology

## 2022-03-14 DIAGNOSIS — R195 Other fecal abnormalities: Secondary | ICD-10-CM

## 2022-03-14 NOTE — Telephone Encounter (Signed)
Please let pt know his small bowel capsule results.    Two nonbleeding small bowel AVMs noted. Few nonbleeding erosions noted at the ileocecal valve. No masses. No active bleeding. No findings that would prohibit him from completing cardiac work up as necessary. Pertinent imaging reviewed by Dr. Gala Romney.   From a GI standpoint he can resume ASA and Plavix.  Recommend following H/H.  Continue lansoprazole '30mg'$  daily. Avoid aspirin powders and other. NSAIDs.    Let's send a copy of capsule study to Finis Bud, NP (cardiologist) Let's have him check H/H in 2 weeks.  He should continue iron infusions as per Dr. Doren Custard.

## 2022-03-14 NOTE — Op Note (Addendum)
   Small Bowel Givens Capsule Study Procedure date:  03/13/2022  Referring Provider:  Garfield Cornea, MD PCP:  Dr. Doren Custard, Hazle Nordmann, MD  Indication for procedure:  IDA/hemoccult positive stool receiving IV iron. Required blood transfusion in 02/2022 when presented with NSTEMI and noted to have acute on chronic anemia with Hgb in the 7 range.  Previous regular BC powder use but quit 2-3 months ago except for occasional use. On ASA '81mg'$  daily. Plavix recently held pending GI work up.  Cardiology requesting GI work up prior to cardiac cath. Of note patient reports two "massive" nosebleeds in Nov/Dec 2023. 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   Patient data:  Wt: 62.9 kg Ht: 5'7"  Findings:  Small bowel study complete. Rapid small bowel transit, 36 minutes. Two small bowel AVMs noted 24 minutes 23 seconds and 24 minutes 38 seconds. Erosions noted at ICV. No active bleeding noted during study.   First Gastric image:  29 seconds First Duodenal image: 10 minutes 39 seconds First Ileo-Cecal Valve image: 33 minutes 29 seconds First Cecal image: 46 minutes 54 seconds Gastric Passage time: 10 minutes Small Bowel Passage time:  36 minutes  Summary & Recommendations:  Two nonbleeding small bowel AVMs noted. Few nonbleeding erosions noted at the ileocecal valve. No masses. No active bleeding. No findings that would prohibit him from completing cardiac work up as necessary. Pertinent imaging reviewed by Dr. Gala Romney.   From a GI standpoint he can resume ASA and Plavix.  Recommend following H/H.  Continue lansoprazole '30mg'$  daily. Avoid aspirin powders and other. NSAIDs.   Laureen Ochs. Bernarda Caffe[facility] [phone] 1/11/20243:05 PM     Attending note:   Pertinent images reviewed.  Agree with above assessment and recommendations as outlined.

## 2022-03-18 ENCOUNTER — Encounter (HOSPITAL_COMMUNITY): Admission: RE | Admit: 2022-03-18 | Payer: Medicare HMO | Source: Ambulatory Visit

## 2022-03-18 ENCOUNTER — Other Ambulatory Visit: Payer: Self-pay

## 2022-03-18 DIAGNOSIS — D509 Iron deficiency anemia, unspecified: Secondary | ICD-10-CM

## 2022-03-18 NOTE — Telephone Encounter (Signed)
Lmom for pt to return my call.  

## 2022-03-18 NOTE — Telephone Encounter (Signed)
Pt was made aware and verbalized understanding. Lab has been ordered and pt was instructed to have completed in 2 weeks.   Manuela Schwartz, please send copy of capsule study to Finis Bud, NP

## 2022-03-20 ENCOUNTER — Encounter
Admission: RE | Admit: 2022-03-20 | Discharge: 2022-03-20 | Disposition: A | Discharge status: Home / Self Care | Payer: Medicare HMO | Source: Ambulatory Visit | Attending: Internal Medicine | Admitting: Internal Medicine

## 2022-03-20 VITALS — BP 138/77 | HR 85 | Temp 98.3°F | Resp 18

## 2022-03-20 DIAGNOSIS — R42 Dizziness and giddiness: Secondary | ICD-10-CM | POA: Diagnosis not present

## 2022-03-20 DIAGNOSIS — Z8249 Family history of ischemic heart disease and other diseases of the circulatory system: Secondary | ICD-10-CM | POA: Diagnosis not present

## 2022-03-20 DIAGNOSIS — F17210 Nicotine dependence, cigarettes, uncomplicated: Secondary | ICD-10-CM | POA: Diagnosis not present

## 2022-03-20 DIAGNOSIS — Z7984 Long term (current) use of oral hypoglycemic drugs: Secondary | ICD-10-CM | POA: Diagnosis not present

## 2022-03-20 DIAGNOSIS — Z794 Long term (current) use of insulin: Secondary | ICD-10-CM | POA: Diagnosis not present

## 2022-03-20 DIAGNOSIS — D500 Iron deficiency anemia secondary to blood loss (chronic): Secondary | ICD-10-CM | POA: Diagnosis not present

## 2022-03-20 DIAGNOSIS — E785 Hyperlipidemia, unspecified: Secondary | ICD-10-CM | POA: Diagnosis not present

## 2022-03-20 DIAGNOSIS — D509 Iron deficiency anemia, unspecified: Secondary | ICD-10-CM

## 2022-03-20 DIAGNOSIS — R011 Cardiac murmur, unspecified: Secondary | ICD-10-CM | POA: Diagnosis not present

## 2022-03-20 DIAGNOSIS — E781 Pure hyperglyceridemia: Secondary | ICD-10-CM | POA: Diagnosis not present

## 2022-03-20 DIAGNOSIS — I2510 Atherosclerotic heart disease of native coronary artery without angina pectoris: Secondary | ICD-10-CM | POA: Diagnosis not present

## 2022-03-20 DIAGNOSIS — Z8673 Personal history of transient ischemic attack (TIA), and cerebral infarction without residual deficits: Secondary | ICD-10-CM | POA: Diagnosis not present

## 2022-03-20 DIAGNOSIS — E1151 Type 2 diabetes mellitus with diabetic peripheral angiopathy without gangrene: Secondary | ICD-10-CM | POA: Diagnosis not present

## 2022-03-20 DIAGNOSIS — I252 Old myocardial infarction: Secondary | ICD-10-CM | POA: Diagnosis not present

## 2022-03-20 DIAGNOSIS — Z79899 Other long term (current) drug therapy: Secondary | ICD-10-CM | POA: Diagnosis not present

## 2022-03-20 DIAGNOSIS — R002 Palpitations: Secondary | ICD-10-CM | POA: Diagnosis not present

## 2022-03-20 DIAGNOSIS — J449 Chronic obstructive pulmonary disease, unspecified: Secondary | ICD-10-CM | POA: Diagnosis not present

## 2022-03-20 MED ORDER — SODIUM CHLORIDE 0.9 % IV SOLN
125.0000 mg | Freq: Once | INTRAVENOUS | Status: AC
  Administered 2022-03-20: 125 mg via INTRAVENOUS
  Filled 2022-03-20: qty 10

## 2022-03-20 NOTE — Progress Notes (Signed)
Diagnosis: Iron Deficiency Anemia  Provider:  Marland Kitchen MD  Procedure: Infusion  IV Type: Peripheral, IV Location: R Antecubital  Ferrlecit (ferric gluconate), Dose: 125 mg  Infusion Start Time: 1315 pm  Infusion Stop Time: 1420 pm  Post Infusion IV Care: Observation period completed and Peripheral IV Discontinued  Discharge: Condition: Good, Destination: Home . AVS provided to patient.   Performed by:  Oren Beckmann, RN

## 2022-03-20 NOTE — Addendum Note (Signed)
Encounter addended by: Baxter Hire, RN on: 03/20/2022 2:29 PM  Actions taken: Therapy plan modified

## 2022-03-25 ENCOUNTER — Encounter (HOSPITAL_COMMUNITY): Payer: Medicare HMO

## 2022-03-27 ENCOUNTER — Other Ambulatory Visit (HOSPITAL_COMMUNITY)
Admission: RE | Admit: 2022-03-27 | Discharge: 2022-03-27 | Disposition: A | Discharge status: Home / Self Care | Payer: Medicare HMO | Source: Ambulatory Visit | Attending: Gastroenterology | Admitting: Gastroenterology

## 2022-03-27 ENCOUNTER — Encounter (HOSPITAL_COMMUNITY)
Admission: RE | Admit: 2022-03-27 | Discharge: 2022-03-27 | Disposition: A | Discharge status: Home / Self Care | Payer: Medicare HMO | Source: Ambulatory Visit | Attending: Internal Medicine | Admitting: Internal Medicine

## 2022-03-27 VITALS — BP 126/73 | HR 73 | Temp 97.7°F | Resp 16

## 2022-03-27 DIAGNOSIS — D509 Iron deficiency anemia, unspecified: Secondary | ICD-10-CM | POA: Diagnosis present

## 2022-03-27 DIAGNOSIS — D500 Iron deficiency anemia secondary to blood loss (chronic): Secondary | ICD-10-CM | POA: Diagnosis not present

## 2022-03-27 LAB — HEMOGLOBIN AND HEMATOCRIT, BLOOD
HCT: 38.7 % — ABNORMAL LOW (ref 39.0–52.0)
Hemoglobin: 11.8 g/dL — ABNORMAL LOW (ref 13.0–17.0)

## 2022-03-27 MED ORDER — DIPHENHYDRAMINE HCL 25 MG PO CAPS: 25.0000 mg | ORAL_CAPSULE | Freq: Once | ORAL | Status: DC

## 2022-03-27 MED ORDER — SODIUM CHLORIDE 0.9 % IV SOLN
125.0000 mg | Freq: Once | INTRAVENOUS | Status: AC
  Administered 2022-03-27: 125 mg via INTRAVENOUS
  Filled 2022-03-27: qty 5

## 2022-03-27 MED ORDER — ACETAMINOPHEN 325 MG PO TABS: 650.0000 mg | ORAL_TABLET | Freq: Once | ORAL | Status: DC

## 2022-03-27 NOTE — Addendum Note (Signed)
Encounter addended by: Baxter Hire, RN on: 03/27/2022 2:23 PM  Actions taken: Check Out activity completed, Therapy plan modified

## 2022-03-27 NOTE — Progress Notes (Signed)
Diagnosis: Iron Deficiency Anemia  Provider:  Marland Kitchen MD  Procedure: Infusion  IV Type: Peripheral, IV Location: L Antecubital  Ferrlecit (ferric gluconate), Dose: 125 mg  Infusion Start Time: 2589 pm  Infusion Stop Time: 1410 pm  Post Infusion IV Care: Observation period completed and Peripheral IV Discontinued  Discharge: Condition: Good, Destination: Home . AVS provided to patient.   Performed by:  Oren Beckmann, RN

## 2022-03-28 ENCOUNTER — Other Ambulatory Visit: Payer: Self-pay

## 2022-03-28 DIAGNOSIS — D509 Iron deficiency anemia, unspecified: Secondary | ICD-10-CM

## 2022-03-29 ENCOUNTER — Ambulatory Visit (INDEPENDENT_AMBULATORY_CARE_PROVIDER_SITE_OTHER): Payer: Medicare HMO

## 2022-03-29 VITALS — BP 102/67 | HR 90 | Ht 67.0 in | Wt 135.0 lb

## 2022-03-29 DIAGNOSIS — Z0000 Encounter for general adult medical examination without abnormal findings: Secondary | ICD-10-CM

## 2022-03-29 NOTE — Progress Notes (Signed)
Subjective:   Jimmy Duran is a 67 y.o. male who presents for Medicare Annual/Subsequent preventive examination.  Review of Systems     Objective:    There were no vitals filed for this visit. There is no height or weight on file to calculate BMI.     03/06/2022   10:43 AM 03/05/2022   10:01 AM 02/24/2022    5:00 PM 01/31/2022   10:18 PM 01/29/2022    5:07 PM 01/29/2022    2:33 PM 10/27/2021    3:59 PM  Advanced Directives  Does Patient Have a Medical Advance Directive? No No No No No No No  Would patient like information on creating a medical advance directive? No - Patient declined No - Patient declined No - Patient declined No - Patient declined Yes (ED - Information included in AVS) No - Patient declined No - Patient declined    Current Medications (verified) Outpatient Encounter Medications as of 03/29/2022  Medication Sig   albuterol (VENTOLIN HFA) 108 (90 Base) MCG/ACT inhaler Inhale 2 puffs into the lungs every 6 (six) hours as needed for wheezing or shortness of breath.   aspirin EC 81 MG tablet Take 1 tablet (81 mg total) by mouth daily at 6 (six) AM. Swallow whole.   clopidogrel (PLAVIX) 75 MG tablet Take 1 tablet (75 mg total) by mouth daily. (Patient not taking: Reported on 03/11/2022)   Continuous Blood Gluc Sensor (FREESTYLE LIBRE 3 SENSOR) MISC 1 each by Does not apply route every 14 (fourteen) days. Place 1 sensor on the skin every 14 days. Use to check glucose continuously   diphenhydrAMINE (BENADRYL) 25 MG tablet Take 25 mg by mouth daily as needed for allergies.    empagliflozin (JARDIANCE) 25 MG TABS tablet Take 1 tablet (25 mg total) by mouth daily before breakfast.   famotidine (PEPCID) 20 MG tablet Take 1 tablet (20 mg total) by mouth 2 (two) times daily.   fenofibrate (TRICOR) 145 MG tablet Take 1 tablet (145 mg total) by mouth daily.   Glucagon (GVOKE HYPOPEN 2-PACK) 1 MG/0.2ML SOAJ Inject 1 mg into the skin as needed (hypoglycemia).   insulin glargine  (LANTUS) 100 UNIT/ML injection Inject 0.18 mLs (18 Units total) into the skin daily.   lansoprazole (PREVACID) 30 MG capsule Take 1 capsule (30 mg total) by mouth 2 (two) times daily before a meal.   metFORMIN (GLUCOPHAGE) 1000 MG tablet Take 1 tablet (1,000 mg total) by mouth 2 (two) times daily with a meal.   metoprolol tartrate (LOPRESSOR) 25 MG tablet Take 0.5 tablets (12.5 mg total) by mouth 2 (two) times daily.   nicotine (NICODERM CQ) 14 mg/24hr patch Place 1 patch (14 mg total) onto the skin daily.   nicotine (NICODERM CQ) 21 mg/24hr patch Place 1 patch (21 mg total) onto the skin daily.   nicotine (NICODERM CQ) 7 mg/24hr patch Place 1 patch (7 mg total) onto the skin daily.   nitroGLYCERIN (NITROSTAT) 0.4 MG SL tablet Place 1 tablet (0.4 mg total) under the tongue every 5 (five) minutes as needed for chest pain (Do not excieed more than 3 tablets in 15 minutes.).   NOVOLOG 100 UNIT/ML injection Inject 14 Units into the skin 3 (three) times daily with meals.   rosuvastatin (CRESTOR) 40 MG tablet Take 1 tablet (40 mg total) by mouth daily.   No facility-administered encounter medications on file as of 03/29/2022.    Allergies (verified) Patient has no known allergies.   History: Past Medical History:  Diagnosis Date   AAA (abdominal aortic aneurysm) (HCC)    Arthritis    Carotid artery disease (HCC)    Cataract    Colon polyp    COPD (chronic obstructive pulmonary disease) (HCC)    Coronary artery disease    Nonobstructive   Essential hypertension    GERD (gastroesophageal reflux disease)    Hemorrhoids    History of TIA (transient ischemic attack)    Low back pain    Lumbar radiculopathy    Mixed hyperlipidemia    Palpitations    Peripheral vascular disease ([place])    Shingles 09/23/2019   Stroke ([place])    Tobacco dependence    Type 2 diabetes mellitus ([place])    Past Surgical History:  Procedure Laterality Date   APPENDECTOMY  1970   BIOPSY  12/08/2017   Procedure:  BIOPSY;  Surgeon: [name], MD;  [facility];  Service: Endoscopy;;  esophagus    BIOPSY  12/28/2020   Procedure: BIOPSY;  Surgeon: [name], MD;  [facility];  Service: Endoscopy;;   BIOPSY  07/12/2021   Procedure: BIOPSY;  Surgeon: [name], MD;  [facility];  Service: Endoscopy;;   BREAST SURGERY Right    Cyst resection on the right   CARDIAC CATHETERIZATION  1990's   in [place]. no stent placement   CATARACT EXTRACTION W/PHACO Right 06/18/2016   Procedure: CATARACT EXTRACTION PHACO AND INTRAOCULAR LENS PLACEMENT ([place]);  Surgeon: [name], MD;  Location: [facility];  Service: Ophthalmology;  Laterality: Right;  [name] 35.7 AP% 16.6 CDE 5.94 Fluid pack lot # [id number]   CATARACT EXTRACTION W/PHACO Left 07/16/2016   Procedure: CATARACT EXTRACTION PHACO AND INTRAOCULAR LENS PLACEMENT ([facility]);  Surgeon: [name], MD;  Location: [facility];  Service: Ophthalmology;  Laterality: Left;  [name] 51.8 AP% 12.7 CDE 6.58 Fluid Pack Lot # [id number]   CHOLECYSTECTOMY  1993   COLONOSCOPY  02/12/2007   [id number] friable anal canal hemorrhoids, otherwise normal rectum and colon   COLONOSCOPY N/A 07/16/2012   RMR: Colonic diverticulosis. 4 mm tubular adenoma   COLONOSCOPY WITH PROPOFOL N/A 12/08/2017   Dr. Gala Romney: sigmoid and descending colon diverticulosis, transverse colon polyp (TUBULAR ADENOMA)   COLONOSCOPY WITH PROPOFOL N/A 03/06/2022   Procedure: COLONOSCOPY WITH PROPOFOL;  Surgeon: [name], MD;  [facility];  Service: Endoscopy;  Laterality: N/A;  12:30 PM   ENDARTERECTOMY Right 10/08/2019   Procedure: RIGHT CAROTID ENDARTERECTOMY;  Surgeon: Rosetta Posner, MD;  [facility];  Service: Vascular;  Laterality: Right;   ESOPHAGOGASTRODUODENOSCOPY (EGD) WITH ESOPHAGEAL DILATION N/A 07/16/2012   RMR: +Candida esophagitis, Erosive reflux esophagiits. Schatizi's ring status post dilation. Hiatal hernia. Antral  erosions status post biopsy.    ESOPHAGOGASTRODUODENOSCOPY (EGD) WITH PROPOFOL N/A 12/08/2017   Dr. Gala Romney: esophagitis, query EOE but negative for increased eosinophils on path, small hiatal hernia, normal stomach, normal duodenum, PAS stain with rare yeast forms on stain. Empiric dilation   ESOPHAGOGASTRODUODENOSCOPY (EGD) WITH PROPOFOL N/A 12/28/2020   esophagitis without bleeding, s/p biopsy and dilation. Antral erosions s/p biopsy.   ESOPHAGOGASTRODUODENOSCOPY (EGD) WITH PROPOFOL N/A 07/12/2021   Surgeon: [name], MD;  Cobblestoning appearance of distal esophagus, slight narrowing of distal esophagus s/p dilation with 55 Fr and biopsy, gastric erosions biopsied, normal examined duodenum.  Esophageal biopsy with reactive squamous mucosa, gastric biopsy with reactive gastropathy, negative for H. pylori.   ESOPHAGOGASTRODUODENOSCOPY (EGD) WITH PROPOFOL N/A 03/06/2022   Procedure:  ESOPHAGOGASTRODUODENOSCOPY (EGD) WITH PROPOFOL;  Surgeon: [name], MD;  [facility];  Service: Endoscopy;  Laterality: N/A;   EYE SURGERY Bilateral 2018   cataract   GIVENS CAPSULE STUDY N/A 03/13/2022   Procedure: GIVENS CAPSULE STUDY;  Surgeon: [name], MD;  [facility];  Service: Endoscopy;  Laterality: N/A;  7:30am   HERNIA REPAIR     JOINT REPLACEMENT     KNEE SURGERY     Left knee arthroscopy torn medial meniscus grade 4 chondral changes medial femoral condyle tibial plateau   MALONEY DILATION N/A 12/08/2017   Procedure: MALONEY DILATION;  Surgeon: [name], MD;  [facility];  Service: Endoscopy;  Laterality: N/A;   MALONEY DILATION N/A 12/28/2020   Procedure: Venia Minks DILATION;  Surgeon: [name], MD;  [facility];  Service: Endoscopy;  Laterality: N/A;   MALONEY DILATION N/A 07/12/2021   Procedure: Venia Minks DILATION;  Surgeon: [name], MD;  [facility];  Service: Endoscopy;  Laterality: N/A;   PATCH  ANGIOPLASTY Right 10/08/2019   Procedure: PATCH ANGIOPLASTY of the right common carotid artery using hemashield plaltinum finesse patch;  Surgeon: Rosetta Posner, MD;  Location: [place];  Service: Vascular;  Laterality: Right;   PERIPHERAL VASCULAR INTERVENTION  07/17/2020   Procedure: PERIPHERAL VASCULAR INTERVENTION;  Surgeon: Waynetta Sandy, MD;  Location: [place] [facility];  Service: Cardiovascular;;   POLYPECTOMY  12/08/2017   Procedure: POLYPECTOMY;  Surgeon: [name], MD;  [facility];  Service: Endoscopy;;  colon   POLYPECTOMY  03/06/2022   Procedure: POLYPECTOMY;  Surgeon: [name], MD;  [facility];  Service: Endoscopy;;   TOTAL KNEE ARTHROPLASTY Left 04/27/2012   Procedure: TOTAL KNEE ARTHROPLASTY;  Surgeon: Carole Civil, MD;  [facility];  Service: Orthopedics;  Laterality: Left;   TRANSCAROTID ARTERY REVASCULARIZATION  Left 10/12/2021   Procedure: Left Transcarotid Artery Revascularization;  Surgeon: Waynetta Sandy, MD;  Location: [place];  Service: Vascular;  Laterality: Left;   [place] N/A 07/17/2020   Procedure: mesenteric ANGIOGRAPHY;  Surgeon: Waynetta Sandy, MD;  Location: [place] [facility];  Service: Cardiovascular;  Laterality: N/A;   Family History  Problem Relation Age of Onset   Dementia Mother    Hypertension Mother    Colon polyps Mother    Osteoporosis Mother    Cancer Father    Hypertension Father    Coronary artery disease Father        CABG in his 28's   Heart attack Father    Cancer - Lung Father    Heart disease Brother        has a pacemaker   Emphysema Maternal Grandmother    Cancer Maternal Grandmother    Cancer - Lung Maternal Grandfather    Diabetes Maternal Grandfather    Cancer Paternal Grandmother    Heart attack Paternal Grandfather    Colon cancer Other        maternal great aunt   Colon cancer Other         paternal great aunt   Social History   Socioeconomic History   Marital status: Legally Separated    Spouse name: Not on file   Number of children: 5   Years of education: Not on file   Highest education level: Not on file  Occupational History   Occupation: Disabled  Employer: [employer]  Tobacco Use   Smoking status: Every Day    Packs/day: 0.50    Years: 30.00    Total pack years: 15.00    Types: Cigarettes   Smokeless tobacco: Former    Types: Chew    Quit date: 03/18/2018   Tobacco comments:    0.5 pk per day currently   Vaping Use   Vaping Use: Former  Substance and Sexual Activity   Alcohol use: Not Currently    Comment: socially "beer and tomato juice"    Drug use: No   Sexual activity: Yes    Birth control/protection: None  Other Topics Concern   Not on file  Social History Narrative   Lives alone.    Social Determinants of Health   Financial Resource Strain: Low Risk  (03/29/2022)   Overall Financial Resource Strain (CARDIA)    Difficulty of Paying Living Expenses: Not hard at all  Food Insecurity: No Food Insecurity (03/29/2022)   Hunger Vital Sign    Worried About Running Out of Food in the Last Year: Never true    Ran Out of Food in the Last Year: Never true  Transportation Needs: No Transportation Needs (03/29/2022)   PRAPARE - Hydrologist (Medical): No    Lack of Transportation (Non-Medical): No  Physical Activity: Insufficiently Active (03/29/2022)   Exercise Vital Sign    Days of Exercise per Week: 3 days    Minutes of Exercise per Session: 20 min  Stress: Stress Concern Present (03/29/2022)   [place]    Feeling of Stress : To some extent  Social Connections: Unknown (03/29/2022)   Social Connection and Isolation Panel [NHANES]    Frequency of Communication with Friends and Family: Once a week    Frequency of Social Gatherings with Friends  and Family: Patient refused    Attends Religious Services: Not on Advertising copywriter or Organizations: No    Attends Archivist Meetings: Patient refused    Marital Status: Separated    Tobacco Counseling Ready to quit: No Counseling given: Not Answered Tobacco comments: 0.5 pk per day currently    Clinical Intake:  How often do you need to have someone help you when you read instructions, pamphlets, or other written materials from your doctor or pharmacy?: 1 - Never  Diabetic?Yes     Activities of Daily Living    03/29/2022    9:58 AM 03/05/2022   10:03 AM  In your present state of health, do you have any difficulty performing the following activities:  Hearing? 1   Vision? 1   Difficulty concentrating or making decisions? 1   Walking or climbing stairs? 0   Dressing or bathing? 0   Doing errands, shopping? 0 0  Preparing Food and eating ? N   Using the Toilet? N   In the past six months, have you accidently leaked urine? N   Do you have problems with loss of bowel control? N   Managing your Medications? N   Managing your Finances? N   Housekeeping or managing your Housekeeping? N     Patient Care Team: [name], MD as PCP - General (Internal Medicine) Satira Sark, MD as PCP - Cardiology (Cardiology) Gala Romney [name], MD as Attending Physician (Gastroenterology) Satira Sark, MD as Consulting Physician (Cardiology)  Indicate any recent Medical Services you may have received from  other than [facility] providers in the past year (date may be approximate).     Assessment:   This is a routine wellness examination for Jimmy Duran.  Hearing/Vision screen No results found.  Dietary issues and exercise activities discussed:     Goals Addressed   None   Depression Screen    03/07/2022   10:46 AM 02/05/2022   11:03 AM 01/23/2022    2:28 PM 01/08/2022    3:03 PM 12/25/2021    4:19 PM 12/11/2021    1:19 PM 08/09/2021    1:53 PM  PHQ  2/9 Scores  PHQ - 2 Score 0 0 0 0 0 0 0  PHQ- 9 Score 0 0 6        Fall Risk    03/29/2022    9:58 AM 03/07/2022   10:46 AM 02/05/2022   11:03 AM 01/23/2022    2:28 PM 01/08/2022    3:03 PM  Fall Risk   Falls in the past year? 0 0 0 0 0  Number falls in past yr: 0 0 0 0 0  Injury with Fall? 0 0 0 0 0  Risk for fall due to :  No Fall Risks No Fall Risks No Fall Risks No Fall Risks  Follow up  Falls evaluation completed Falls evaluation completed Falls evaluation completed Falls evaluation completed    [place]:  Any stairs in or around the home? No  If so, are there any without handrails?  NA Home free of loose throw rugs in walkways, pet beds, electrical cords, etc? Yes  Adequate lighting in your home to reduce risk of falls? Yes   ASSISTIVE DEVICES UTILIZED TO PREVENT FALLS:  Life alert? No  Use of a cane, walker or w/c? No  Grab bars in the bathroom? No  Shower chair or bench in shower? No  Elevated toilet seat or a handicapped toilet? No   TIMED UP AND GO:  Was the test performed? Yes .  Length of time to ambulate 10 feet: 5 sec.   Gait steady and fast without use of assistive device  Cognitive Function:        Immunizations Immunization History  Administered Date(s) Administered   Influenza,inj,Quad PF,6+ Mos 12/11/2021   Moderna SARS-COV2 Booster Vaccination 02/03/2020   Moderna Sars-Covid-2 Vaccination 06/09/2019, 07/07/2019, 02/15/2022   Pfizer Covid-19 Vaccine Bivalent Booster 41yr & up 11/30/2020   Pneumococcal Polysaccharide-23 04/23/2013   Tdap 05/04/2011    TDAP status: Due, Education has been provided regarding the importance of this vaccine. Advised may receive this vaccine at local pharmacy or Health Dept. Aware to provide a copy of the vaccination record if obtained from local pharmacy or Health Dept. Verbalized acceptance and understanding.  Flu Vaccine status: Up to date  Pneumococcal vaccine status:  Declined,  Education has been provided regarding the importance of this vaccine but patient still declined. Advised may receive this vaccine at local pharmacy or Health Dept. Aware to provide a copy of the vaccination record if obtained from local pharmacy or Health Dept. Verbalized acceptance and understanding.   Covid-19 vaccine status: Completed vaccines  Qualifies for Shingles Vaccine? Yes   Zostavax completed No   Shingrix Completed?: No.    Education has been provided regarding the importance of this vaccine. Patient has been advised to call insurance company to determine out of pocket expense if they have not yet received this vaccine. Advised may also receive vaccine at local pharmacy or Health Dept.  Verbalized acceptance and understanding.  Screening Tests Health Maintenance  Topic Date Due   OPHTHALMOLOGY EXAM  Never done   Zoster Vaccines- Shingrix (1 of 2) Never done   DTaP/Tdap/Td (2 - Td or Tdap) 05/03/2021   COVID-19 Vaccine (6 - 2023-24 season) 04/12/2022   HEMOGLOBIN A1C  06/12/2022   Lung Cancer Screening  09/07/2022   Diabetic kidney evaluation - Urine ACR  09/11/2022   FOOT EXAM  12/26/2022   Diabetic kidney evaluation - eGFR measurement  03/08/2023   Medicare Annual Wellness (AWV)  03/30/2023   COLONOSCOPY (Pts 45-51yr Insurance coverage will need to be confirmed)  03/06/2032   INFLUENZA VACCINE  Completed   Hepatitis C Screening  Completed   HIV Screening  Completed   HPV VACCINES  Aged Out    Health Maintenance  Health Maintenance Due  Topic Date Due   OPHTHALMOLOGY EXAM  Never done   Zoster Vaccines- Shingrix (1 of 2) Never done   DTaP/Tdap/Td (2 - Td or Tdap) 05/03/2021    Colorectal cancer screening: Type of screening: Colonoscopy. Completed 2024. Repeat every 7 years  Lung Cancer Screening: (Low Dose CT Chest recommended if Age 25110-80years, 30 pack-year currently smoking OR have quit w/in 15years.) does qualify.   Lung Cancer Screening Referral:  requested  Additional Screening:  Hepatitis C Screening: does not qualify; Completed   Vision Screening: Recommended annual ophthalmology exams for early detection of glaucoma and other disorders of the eye. Is the patient up to date with their annual eye exam?  Yes  Who is the provider or what is the name of the office in which the patient attends annual eye exams? [facility] If pt is not established with a provider, would they like to be referred to a provider to establish care? No .   Dental Screening: Recommended annual dental exams for proper oral hygiene  Community Resource Referral / Chronic Care Management: CRR required this visit?  No   CCM required this visit?  No      Plan:     I have personally reviewed and noted the following in the patient's chart:   Medical and social history Use of alcohol, tobacco or illicit drugs  Current medications and supplements including opioid prescriptions. Patient is not currently taking opioid prescriptions. Functional ability and status Nutritional status Physical activity Advanced directives List of other physicians Hospitalizations, surgeries, and ER visits in previous 12 months Vitals Screenings to include cognitive, depression, and falls Referrals and appointments  In addition, I have reviewed and discussed with patient certain preventive protocols, quality metrics, and best practice recommendations. A written personalized care plan for preventive services as well as general preventive health recommendations were provided to patient.     TSmitty Knudsen CMA   03/29/2022

## 2022-03-29 NOTE — Patient Instructions (Signed)
  Mr. Jimmy Duran , Thank you for taking time to come for your Medicare Wellness Visit. I appreciate your ongoing commitment to your health goals. Please review the following plan we discussed and let me know if I can assist you in the future.   These are the goals we discussed:  Goals   None     This is a list of the screening recommended for you and due dates:  Health Maintenance  Topic Date Due   Eye exam for diabetics  Never done   Zoster (Shingles) Vaccine (1 of 2) Never done   DTaP/Tdap/Td vaccine (2 - Td or Tdap) 05/03/2021   COVID-19 Vaccine (6 - 2023-24 season) 04/12/2022   Hemoglobin A1C  06/12/2022   Yearly kidney health urinalysis for diabetes  09/11/2022   Complete foot exam   12/26/2022   Yearly kidney function blood test for diabetes  03/08/2023   Medicare Annual Wellness Visit  03/30/2023   Colon Cancer Screening  03/06/2032   Flu Shot  Completed   Hepatitis C Screening: USPSTF Recommendation to screen - Ages 18-79 yo.  Completed   HIV Screening  Completed   HPV Vaccine  Aged Out   Screening for Lung Cancer  Discontinued

## 2022-04-01 ENCOUNTER — Encounter (HOSPITAL_COMMUNITY): Payer: Medicare HMO

## 2022-04-03 ENCOUNTER — Encounter (HOSPITAL_COMMUNITY)
Admission: RE | Admit: 2022-04-03 | Discharge: 2022-04-03 | Disposition: A | Discharge status: Home / Self Care | Payer: Medicare HMO | Source: Ambulatory Visit | Attending: Internal Medicine | Admitting: Internal Medicine

## 2022-04-03 VITALS — BP 104/70 | HR 90 | Temp 97.4°F | Resp 16

## 2022-04-03 DIAGNOSIS — D509 Iron deficiency anemia, unspecified: Secondary | ICD-10-CM

## 2022-04-03 DIAGNOSIS — D500 Iron deficiency anemia secondary to blood loss (chronic): Secondary | ICD-10-CM | POA: Diagnosis not present

## 2022-04-03 MED ORDER — SODIUM CHLORIDE 0.9 % IV SOLN
125.0000 mg | Freq: Once | INTRAVENOUS | Status: AC
  Administered 2022-04-03: 125 mg via INTRAVENOUS
  Filled 2022-04-03: qty 10

## 2022-04-03 NOTE — Progress Notes (Signed)
Diagnosis: Iron Deficiency Anemia  Provider:  Marland Kitchen MD  Procedure: Infusion  IV Type: Peripheral, IV Location: L Antecubital  Ferrlecit (ferric gluconate), Dose: 125 mg  Infusion Start Time: 1217  Infusion Stop Time: 1319  Post Infusion IV Care: Patient declined observation and Peripheral IV Discontinued  Discharge: Condition: Good, Destination: Home . AVS provided to patient.   Performed by:  Binnie Kand, RN

## 2022-04-04 ENCOUNTER — Encounter: Payer: Self-pay | Admitting: Internal Medicine

## 2022-04-04 ENCOUNTER — Ambulatory Visit (INDEPENDENT_AMBULATORY_CARE_PROVIDER_SITE_OTHER): Payer: Medicare HMO | Admitting: Internal Medicine

## 2022-04-04 VITALS — BP 131/75 | HR 86 | Ht 67.0 in | Wt 137.0 lb

## 2022-04-04 DIAGNOSIS — D509 Iron deficiency anemia, unspecified: Secondary | ICD-10-CM

## 2022-04-04 DIAGNOSIS — I2510 Atherosclerotic heart disease of native coronary artery without angina pectoris: Secondary | ICD-10-CM | POA: Diagnosis not present

## 2022-04-04 DIAGNOSIS — E1159 Type 2 diabetes mellitus with other circulatory complications: Secondary | ICD-10-CM

## 2022-04-04 DIAGNOSIS — D62 Acute posthemorrhagic anemia: Secondary | ICD-10-CM | POA: Diagnosis not present

## 2022-04-04 MED ORDER — SITAGLIPTIN PHOSPHATE 100 MG PO TABS: 100.0000 mg | ORAL_TABLET | Freq: Every day | ORAL | 2 refills | Status: DC

## 2022-04-04 MED ORDER — INSULIN GLARGINE 100 UNIT/ML ~~LOC~~ SOLN: 24.0000 [IU] | Freq: Every day | SUBCUTANEOUS | 99 refills | Status: DC

## 2022-04-04 NOTE — Patient Instructions (Signed)
It was a pleasure to see you today.  Thank you for giving [name] the opportunity to be involved in your care.  Below is a brief recap of your visit and next steps.  We will plan to see you again in 2 weeks.  Summary Increase lantus to 24 units nightly. Further increase by 4 units if your blood sugar remains > 130 Please check your blood sugar before each meal Start Januvia 100 mg daily Repeat labs ordered for next week Follow up in 2 weeks for video visit

## 2022-04-04 NOTE — Progress Notes (Signed)
Established Patient Office Visit  Subjective   Patient ID: Jimmy Duran, male    DOB: 1955-08-12  Age: 67 y.o. MRN: [id number]  Chief Complaint  Patient presents with   Diabetes    Follow up   Jimmy Duran returns to care today.  He was last seen by me on 1/4 for hospital follow-up in the setting of hospital mission for acute on chronic blood loss anemia and NSTEMI.  In the interim he has been seen by cardiology and underwent PillCam study on 1/10, which revealed 2 nonbleeding small bowel AVMs and nonbleeding erosions at the ileocecal valve.  No masses or active bleeding were identified.  He has also received 3 iron infusions, most recently yesterday (1/31). Jimmy Duran states that he feels fairly well today.He has no acute concerns to discuss.  Past Medical History:  Diagnosis Date   AAA (abdominal aortic aneurysm) (HCC)    Arthritis    Carotid artery disease (HCC)    Cataract    Colon polyp    COPD (chronic obstructive pulmonary disease) (HCC)    Coronary artery disease    Nonobstructive   Essential hypertension    GERD (gastroesophageal reflux disease)    Hemorrhoids    History of TIA (transient ischemic attack)    Low back pain    Lumbar radiculopathy    Mixed hyperlipidemia    Palpitations    Peripheral vascular disease ([place])    Shingles 09/23/2019   Stroke ([place])    Tobacco dependence    Type 2 diabetes mellitus ([place])    Past Surgical History:  Procedure Laterality Date   APPENDECTOMY  1970   BIOPSY  12/08/2017   Procedure: BIOPSY;  Surgeon: [name], MD;  [facility];  Service: Endoscopy;;  esophagus    BIOPSY  12/28/2020   Procedure: BIOPSY;  Surgeon: [name], MD;  [facility];  Service: Endoscopy;;   BIOPSY  07/12/2021   Procedure: BIOPSY;  Surgeon: [name], MD;  [facility];  Service: Endoscopy;;   BREAST SURGERY Right    Cyst resection on the right   CARDIAC CATHETERIZATION  1990's   in [place]. no stent placement   CATARACT EXTRACTION W/PHACO Right 06/18/2016   Procedure: CATARACT EXTRACTION PHACO AND INTRAOCULAR LENS PLACEMENT ([place]);  Surgeon: [name], MD;  Location: [facility];  Service: Ophthalmology;  Laterality: Right;  [name] 35.7 AP% 16.6 CDE 5.94 Fluid pack lot # [id number]   CATARACT EXTRACTION W/PHACO Left 07/16/2016   Procedure: CATARACT EXTRACTION PHACO AND INTRAOCULAR LENS PLACEMENT ([facility]);  Surgeon: [name], MD;  Location: [facility];  Service: Ophthalmology;  Laterality: Left;  [name] 51.8 AP% 12.7 CDE 6.58 Fluid Pack Lot # [id number]   CHOLECYSTECTOMY  1993   COLONOSCOPY  02/12/2007   NIO:[id number] friable anal canal hemorrhoids, otherwise normal rectum and colon   COLONOSCOPY N/A 07/16/2012   RMR: Colonic diverticulosis. 4 mm tubular adenoma   COLONOSCOPY WITH PROPOFOL N/A 12/08/2017   Dr. Gala Romney: sigmoid and descending colon diverticulosis, transverse colon polyp (TUBULAR ADENOMA)   COLONOSCOPY WITH PROPOFOL N/A 03/06/2022   Procedure: COLONOSCOPY WITH PROPOFOL;  Surgeon: [name], MD;  [facility];  Service: Endoscopy;  Laterality: N/A;  12:30 PM   ENDARTERECTOMY Right 10/08/2019   Procedure: RIGHT CAROTID ENDARTERECTOMY;  Surgeon: Rosetta Posner, MD;  [facility]: [facility];  Service: Vascular;  Laterality: Right;   ESOPHAGOGASTRODUODENOSCOPY (EGD) WITH ESOPHAGEAL DILATION N/A 07/16/2012  RMR: +Candida esophagitis, Erosive reflux esophagiits. Schatizi's ring status post dilation. Hiatal hernia. Antral erosions status post biopsy.    ESOPHAGOGASTRODUODENOSCOPY (EGD) WITH PROPOFOL N/A 12/08/2017   Dr. Gala Romney: esophagitis, query EOE but negative for increased eosinophils on path, small hiatal hernia, normal stomach, normal duodenum, PAS stain with rare yeast forms on stain. Empiric dilation   ESOPHAGOGASTRODUODENOSCOPY (EGD) WITH PROPOFOL N/A 12/28/2020   esophagitis without bleeding, s/p biopsy and dilation. Antral erosions s/p biopsy.    ESOPHAGOGASTRODUODENOSCOPY (EGD) WITH PROPOFOL N/A 07/12/2021   Surgeon: [name], MD;  Cobblestoning appearance of distal esophagus, slight narrowing of distal esophagus s/p dilation with 49 Fr and biopsy, gastric erosions biopsied, normal examined duodenum.  Esophageal biopsy with reactive squamous mucosa, gastric biopsy with reactive gastropathy, negative for H. pylori.   ESOPHAGOGASTRODUODENOSCOPY (EGD) WITH PROPOFOL N/A 03/06/2022   Procedure: ESOPHAGOGASTRODUODENOSCOPY (EGD) WITH PROPOFOL;  Surgeon: [name], MD;  [facility];  Service: Endoscopy;  Laterality: N/A;   EYE SURGERY Bilateral 2018   cataract   GIVENS CAPSULE STUDY N/A 03/13/2022   Procedure: GIVENS CAPSULE STUDY;  Surgeon: [name], MD;  [facility];  Service: Endoscopy;  Laterality: N/A;  7:30am   HERNIA REPAIR     JOINT REPLACEMENT     KNEE SURGERY     Left knee arthroscopy torn medial meniscus grade 4 chondral changes medial femoral condyle tibial plateau   MALONEY DILATION N/A 12/08/2017   Procedure: MALONEY DILATION;  Surgeon: [name], MD;  [facility];  Service: Endoscopy;  Laterality: N/A;   MALONEY DILATION N/A 12/28/2020   Procedure: Venia Minks DILATION;  Surgeon: [name], MD;  [facility];  Service: Endoscopy;  Laterality: N/A;   MALONEY DILATION N/A 07/12/2021   Procedure: Venia Minks DILATION;  Surgeon: [name], MD;  [facility];  Service: Endoscopy;  Laterality: N/A;   PATCH ANGIOPLASTY Right 10/08/2019   Procedure: PATCH ANGIOPLASTY of the right common carotid artery using hemashield plaltinum finesse patch;  Surgeon: Rosetta Posner, MD;  Location: [facility];  Service: Vascular;  Laterality: Right;   PERIPHERAL VASCULAR INTERVENTION  07/17/2020   Procedure: PERIPHERAL VASCULAR INTERVENTION;  Surgeon: Waynetta Sandy, MD;  Location: [facility];  Service: Cardiovascular;;   POLYPECTOMY  12/08/2017    Procedure: POLYPECTOMY;  Surgeon: [name], MD;  [facility];  Service: Endoscopy;;  colon   POLYPECTOMY  03/06/2022   Procedure: POLYPECTOMY;  Surgeon: [name], MD;  [facility];  Service: Endoscopy;;   TOTAL KNEE ARTHROPLASTY Left 04/27/2012   Procedure: TOTAL KNEE ARTHROPLASTY;  Surgeon: Carole Civil, MD;  [facility]: [facility];  Service: Orthopedics;  Laterality: Left;   TRANSCAROTID ARTERY REVASCULARIZATION  Left 10/12/2021   Procedure: Left Transcarotid Artery Revascularization;  Surgeon: Waynetta Sandy, MD;  Location: [facility];  Service: Vascular;  Laterality: Left;   [place] N/A 07/17/2020   Procedure: mesenteric ANGIOGRAPHY;  Surgeon: Waynetta Sandy, MD;  Location: [facility];  Service: Cardiovascular;  Laterality: N/A;   Social History   Tobacco Use   Smoking status: Every Day    Packs/day: 0.50    Years: 30.00    Total pack years: 15.00    Types: Cigarettes   Smokeless tobacco: Former    Types: Chew    Quit date: 03/18/2018   Tobacco comments:    0.5 pk per  day currently   Vaping Use   Vaping Use: Former  Substance Use Topics   Alcohol use: Not Currently    Comment: socially "beer and tomato juice"    Drug use: No   Family History  Problem Relation Age of Onset   Dementia Mother    Hypertension Mother    Colon polyps Mother    Osteoporosis Mother    Cancer Father    Hypertension Father    Coronary artery disease Father        CABG in his 10's   Heart attack Father    Cancer - Lung Father    Heart disease Brother        has a pacemaker   Emphysema Maternal Grandmother    Cancer Maternal Grandmother    Cancer - Lung Maternal Grandfather    Diabetes Maternal Grandfather    Cancer Paternal Grandmother    Heart attack Paternal Grandfather    Colon cancer Other        maternal great aunt   Colon cancer Other        paternal great aunt   No Known  Allergies  Review of Systems  Constitutional:  Negative for chills and fever.  HENT:  Negative for sore throat.   Respiratory:  Negative for cough and shortness of breath.   Cardiovascular:  Negative for chest pain, palpitations and leg swelling.  Gastrointestinal:  Negative for abdominal pain, blood in stool, constipation, diarrhea, nausea and vomiting.  Genitourinary:  Negative for dysuria and hematuria.  Musculoskeletal:  Negative for myalgias.  Skin:  Negative for itching and rash.  Neurological:  Negative for dizziness and headaches.  Psychiatric/Behavioral:  Negative for depression and suicidal ideas.      Objective:     BP 131/75   Pulse 86   Ht '5\' 7"'$  (1.702 m)   Wt 137 lb (62.1 kg)   SpO2 96%   BMI 21.46 kg/m  BP Readings from Last 3 Encounters:  04/04/22 131/75  04/03/22 104/70  03/29/22 102/67   Physical Exam Vitals reviewed.  Constitutional:      General: He is not in acute distress.    Appearance: Normal appearance. He is not ill-appearing.  HENT:     Head: Normocephalic and atraumatic.     Right Ear: External ear normal.     Left Ear: External ear normal.     Nose: Nose normal. No congestion or rhinorrhea.     Mouth/Throat:     Mouth: Mucous membranes are moist.     Pharynx: Oropharynx is clear.  Eyes:     General: No scleral icterus.    Extraocular Movements: Extraocular movements intact.     Conjunctiva/sclera: Conjunctivae normal.     Pupils: Pupils are equal, round, and reactive to light.  Cardiovascular:     Rate and Rhythm: Normal rate and regular rhythm.     Pulses: Normal pulses.     Heart sounds: Murmur heard.  Pulmonary:     Effort: Pulmonary effort is normal.     Breath sounds: Normal breath sounds. No wheezing, rhonchi or rales.  Abdominal:     General: Abdomen is flat. Bowel sounds are normal. There is no distension.     Palpations: Abdomen is soft.     Tenderness: There is no abdominal tenderness.  Musculoskeletal:        General:  No swelling or deformity. Normal range of motion.     Cervical back: Normal range of motion.  Skin:  General: Skin is warm and dry.     Capillary Refill: Capillary refill takes less than 2 seconds.  Neurological:     General: No focal deficit present.     Mental Status: He is alert and oriented to person, place, and time.     Motor: No weakness.  Psychiatric:        Mood and Affect: Mood normal.        Behavior: Behavior normal.        Thought Content: Thought content normal.   Last CBC Lab Results  Component Value Date   WBC 8.1 03/07/2022   HGB 11.8 (L) 03/27/2022   HCT 38.7 (L) 03/27/2022   MCV 83 03/07/2022   MCH 25.4 (L) 03/07/2022   RDW 16.8 (H) 03/07/2022   PLT 371 62/83/6629   Last metabolic panel Lab Results  Component Value Date   GLUCOSE 229 (H) 03/07/2022   NA 138 03/07/2022   K 4.1 03/07/2022   CL 96 03/07/2022   CO2 21 03/07/2022   BUN 5 (L) 03/07/2022   CREATININE 0.81 03/07/2022   EGFR 98 03/07/2022   CALCIUM 9.2 03/07/2022   PROT 6.3 03/07/2022   ALBUMIN 4.1 03/07/2022   LABGLOB 2.2 03/07/2022   AGRATIO 1.9 03/07/2022   BILITOT <0.2 03/07/2022   ALKPHOS 96 03/07/2022   AST 20 03/07/2022   ALT 20 03/07/2022   ANIONGAP 12 02/25/2022   Last lipids Lab Results  Component Value Date   CHOL 92 02/25/2022   HDL 37 (L) 02/25/2022   LDLCALC 9 02/25/2022   TRIG 230 (H) 02/25/2022   CHOLHDL 2.5 02/25/2022   Last hemoglobin A1c Lab Results  Component Value Date   HGBA1C 9.6 (H) 12/11/2021   Last thyroid functions Lab Results  Component Value Date   TSH 1.790 10/18/2013     Assessment & Plan:   Problem List Items Addressed This Visit       CORONARY ATHEROSCLEROSIS NATIVE CORONARY ARTERY    Seen by cardiology for hospital follow-up in 1/8.  Plavix previously on hold, but has been resumed following recent PillCam results.  He denies recent chest pain. -No medication changes today.  Continue ASA/Plavix and statin therapy -Cardiology  follow-up scheduled for next week      DM type 2 causing vascular disease ([place]) - Primary    A1c 9.6 in October.  He is currently prescribed Lantus 18 units nightly, NovoLog 14 units 3 times daily with meals, Jardiance 25 mg daily, and metformin 1000 mg twice daily.  Darcel Bayley has previously been prescribed, however he has not been able to fill the prescription due to cost.  Today he reports that he simply cannot afford the medication.  Recent blood sugar readings have been significantly elevated. -Increase Lantus to 24 units.  He was instructed to further increase Lantus in increments of 4 units if AM sugars remain > 130. -Start Januvia 100 mg daily -Will plan for video visit in 2 weeks to review blood sugar readings after today's adjustments      Iron deficiency anemia, unspecified    Last iron infusion completed yesterday (1/31). -Repeat iron studies ordered today      Acute on chronic blood loss anemia    Hgb stable on repeat labs from 1/24.  PillCam identified 2 nonbleeding AVMs in the small bowel as well as nonbleeding erosions at the ileocecal valve.  He is currently on Prevacid and Pepcid.  Asymptomatic currently. -No changes today.  GI follow-up scheduled for next month.  Return in about 2 weeks (around 04/18/2022) for DM.    [name], MD

## 2022-04-05 LAB — HM DIABETES EYE EXAM

## 2022-04-09 NOTE — Assessment & Plan Note (Signed)
A1c 9.6 in October.  He is currently prescribed Lantus 18 units nightly, NovoLog 14 units 3 times daily with meals, Jardiance 25 mg daily, and metformin 1000 mg twice daily.  Jimmy Duran has previously been prescribed, however he has not been able to fill the prescription due to cost.  Today he reports that he simply cannot afford the medication.  Recent blood sugar readings have been significantly elevated. -Increase Lantus to 24 units.  He was instructed to further increase Lantus in increments of 4 units if AM sugars remain > 130. -Start Januvia 100 mg daily -Will plan for video visit in 2 weeks to review blood sugar readings after today's adjustments

## 2022-04-09 NOTE — Assessment & Plan Note (Signed)
Last iron infusion completed yesterday (1/31). -Repeat iron studies ordered today

## 2022-04-09 NOTE — Assessment & Plan Note (Addendum)
Hgb stable on repeat labs from 1/24.  PillCam identified 2 nonbleeding AVMs in the small bowel as well as nonbleeding erosions at the ileocecal valve.  He is currently on Prevacid and Pepcid.  Asymptomatic currently. -No changes today.  GI follow-up scheduled for next month.

## 2022-04-09 NOTE — Assessment & Plan Note (Addendum)
Seen by cardiology for hospital follow-up in 1/8.  Plavix previously on hold, but has been resumed following recent PillCam results.  He denies recent chest pain. -No medication changes today.  Continue ASA/Plavix and statin therapy -Cardiology follow-up scheduled for next week

## 2022-04-11 ENCOUNTER — Encounter: Payer: Medicare HMO | Attending: Internal Medicine | Admitting: Nurse Practitioner

## 2022-04-11 ENCOUNTER — Encounter (HOSPITAL_COMMUNITY): Payer: Self-pay | Admitting: Internal Medicine

## 2022-04-11 ENCOUNTER — Encounter: Payer: Self-pay | Admitting: Nurse Practitioner

## 2022-04-11 VITALS — BP 138/82 | HR 93 | Ht 67.0 in | Wt 137.4 lb

## 2022-04-11 DIAGNOSIS — E785 Hyperlipidemia, unspecified: Secondary | ICD-10-CM | POA: Insufficient documentation

## 2022-04-11 DIAGNOSIS — E781 Pure hyperglyceridemia: Secondary | ICD-10-CM | POA: Insufficient documentation

## 2022-04-11 DIAGNOSIS — I2510 Atherosclerotic heart disease of native coronary artery without angina pectoris: Secondary | ICD-10-CM | POA: Diagnosis not present

## 2022-04-11 DIAGNOSIS — R011 Cardiac murmur, unspecified: Secondary | ICD-10-CM | POA: Insufficient documentation

## 2022-04-11 DIAGNOSIS — I739 Peripheral vascular disease, unspecified: Secondary | ICD-10-CM | POA: Diagnosis not present

## 2022-04-11 DIAGNOSIS — Z720 Tobacco use: Secondary | ICD-10-CM | POA: Insufficient documentation

## 2022-04-11 MED ORDER — METOPROLOL TARTRATE 25 MG PO TABS: 25.0000 mg | ORAL_TABLET | Freq: Two times a day (BID) | ORAL | 6 refills | Status: DC

## 2022-04-11 NOTE — Progress Notes (Signed)
Cardiology Office Note:    Date:  04/11/2022  ID:  Jimmy Duran, DOB Oct 11, 1955, MRN [id number]  PCP:  [name], MD   [facility] Cardiologist:  [name], MD     Referring MD: [name], MD   CC: Here for follow-up  History of Present Illness:    Jimmy Duran is a 67 y.o. male with a hx of the following:  CAD Carotid stenosis Hypertension Type 2 diabetes History of TIA COPD GERD Tobacco abuse Palpitations, dizziness IDA  Patient is a delightful 67 year old male with past medical history as mentioned above.  Previous cardiovascular history includes nonobstructive CAD and low risk stress test in 2015  In 2021 he underwent right CEA, and underwent previous stenting to superior mesenteric artery in 2022.  More recently, was admitted to [facility] in August 2023 for arranged left TCAR, no immediate complications.  Presented back to [facility] ED on October 27, 2021 for worsening dizziness x 3 weeks.  Received IV fluids due to poor p.o. intake.  Lopressor was reduced and was recommended to follow-up with cardiology outpatient.  Last seen by [name], PA-C on November 22, 2021 for hospital follow-up.  At office visit that day he noted worsening lightheadedness x 1 month, not triggered with certain movements.  Said sometimes his legs feel weak and experienced occasional falls.  Denied any chest pain or palpitations, denied any shortness of breath.  Noted 40 pound weight loss over the past year, this was unintentional.  Was due for repeat colonoscopy in 2024.  Was going to follow-up with his PCP regarding this.  Orthostatics checked in office and found to have SBP dropped by 10 points.  It was recommended to stop hydrochlorothiazide and was given a prescription for lisinopril 20 mg.  Was told to monitor and log his BP.  Was told to follow-up in 3 months.  02/21/2022 - I saw him for 3 month follow up.  Noted lightheaded since  carotid surgery intermittently denied any vertigo symptoms. Was off lisinopril for a week for hypotension.  His symptoms improved significantly after stopping lisinopril.  Blood pressure averaging 117/68.  Did admit to occasional palpitations, nonsustained, not bothersome per his report. Denied chest pain. 14 day Zio and Echo were ordered. Labs were arranged. Was told to follow up in 2 months.   Was admitted with NSTEMI at [facility] on 02/23/2022. EKG showed mild ST with minimal ST depression in lateral leads < 1 mm. Serial trops 28 -> 150 (peak) in setting of demand ischemia due to anemia and heme positive stool. Received blood transfusions. Plavix held d/t hx of anemia/ GI bleed. Echo updated in hospital and revealed normal LV function. GI workup was favored before proceeding with invasive cardiac testing. ASA was continued and was instructed not to restart Plavix. Pt left AMA on 02/25/2022. Was told to follow up with GI and cardiology.   Underwent upper endoscopy and colonoscopy on 03/06/2022. Upper endoscopy showed hiatal hernia, but was otherwise normal.  Colonoscopy revealed 2 polyps, 5 and 7 mm in size that were removed, otherwise was unremarkable.   03/11/2022 - Saw him for follow-up. GI doctor was arranging capsule endoscopy. Was feeling better after receiving the blood transfusion. Plavix was held. Was smoking 2 PPD, very motivated to quick smoking. Cardiac monitor revealed SR, PAC's < 1% burden. Brief run of SVT, not sustained. Rare PVC's < 1% total beats, had 4 beat run of NSVT vs aberrant SVT, no sustained  pauses or arrhthymias. 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 Few non-bleeding erosions noted along ileocecal valve without masses, no active bleeding noted. Was contacted by GI PA-C that he was okay to resume ASA and Plavix.   04/11/2022 - Presents back for follow-up. Doing well and says he's feeling a little better since last visit. Denies any chest pain, shortness of breath,  palpitations, syncope, presyncope, dizziness, orthopnea, PND, swelling or significant weight changes, acute bleeding, or claudication. Wants to get back to getting more active. Still continues to smoke.   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 Loves to do yard work.   Past Medical History:  Diagnosis Date   AAA (abdominal aortic aneurysm) (HCC)    Arthritis    Carotid artery disease (HCC)    Cataract    Colon polyp    COPD (chronic obstructive pulmonary disease) (HCC)    Coronary artery disease    Nonobstructive   Essential hypertension    GERD (gastroesophageal reflux disease)    Hemorrhoids    History of TIA (transient ischemic attack)    Low back pain    Lumbar radiculopathy    Mixed hyperlipidemia    Palpitations    Peripheral vascular disease ([place])    Shingles 09/23/2019   Stroke ([place])    Tobacco dependence    Type 2 diabetes mellitus ([place])     Past Surgical History:  Procedure Laterality Date   APPENDECTOMY  1970   BIOPSY  12/08/2017   Procedure: BIOPSY;  Surgeon: [name], MD;  [facility];  Service: Endoscopy;;  esophagus    BIOPSY  12/28/2020   Procedure: BIOPSY;  Surgeon: [name], MD;  [facility];  Service: Endoscopy;;   BIOPSY  07/12/2021   Procedure: BIOPSY;  Surgeon: [name], MD;  [facility];  Service: Endoscopy;;   BREAST SURGERY Right    Cyst resection on the right   CARDIAC CATHETERIZATION  1990's   in [place]. no stent placement   CATARACT EXTRACTION W/PHACO Right 06/18/2016   Procedure: CATARACT EXTRACTION PHACO AND INTRAOCULAR LENS PLACEMENT ([place]);  Surgeon: [name], MD;  Location: [facility];  Service: Ophthalmology;  Laterality: Right;  [name] 35.7 AP% 16.6 CDE 5.94 Fluid pack lot # [id number]   CATARACT EXTRACTION W/PHACO Left 07/16/2016   Procedure: CATARACT EXTRACTION PHACO AND INTRAOCULAR LENS PLACEMENT (I[facility]);  Surgeon: [name], MD;  Location: [facility];   Service: Ophthalmology;  Laterality: Left;  [name] 51.8 AP% 12.7 CDE 6.58 Fluid Pack Lot # [id number]   CHOLECYSTECTOMY  1993   COLONOSCOPY  02/12/2007   BRA:XENMMHWKG friable anal canal hemorrhoids, otherwise normal rectum and colon   COLONOSCOPY N/A 07/16/2012   RMR: Colonic diverticulosis. 4 mm tubular adenoma   COLONOSCOPY WITH PROPOFOL N/A 12/08/2017   Dr. Gala Romney: sigmoid and descending colon diverticulosis, transverse colon polyp (TUBULAR ADENOMA)   COLONOSCOPY WITH PROPOFOL N/A 03/06/2022   Procedure: COLONOSCOPY WITH PROPOFOL;  Surgeon: [name], MD;  [facility];  Service: Endoscopy;  Laterality: N/A;  12:30 PM   ENDARTERECTOMY Right 10/08/2019   Procedure: RIGHT CAROTID ENDARTERECTOMY;  Surgeon: Rosetta Posner, MD;  Location: [facility];  Service: Vascular;  Laterality: Right;   ESOPHAGOGASTRODUODENOSCOPY (EGD) WITH ESOPHAGEAL DILATION N/A 07/16/2012   RMR: +Candida esophagitis, Erosive reflux esophagiits. Schatizi's ring status post dilation. Hiatal hernia. Antral erosions status post biopsy.    ESOPHAGOGASTRODUODENOSCOPY (EGD) WITH PROPOFOL N/A 12/08/2017   Dr.  Rourk: esophagitis, query EOE but negative for increased eosinophils on path, small hiatal hernia, normal stomach, normal duodenum, PAS stain with rare yeast forms on stain. Empiric dilation   ESOPHAGOGASTRODUODENOSCOPY (EGD) WITH PROPOFOL N/A 12/28/2020   esophagitis without bleeding, s/p biopsy and dilation. Antral erosions s/p biopsy.   ESOPHAGOGASTRODUODENOSCOPY (EGD) WITH PROPOFOL N/A 07/12/2021   Surgeon: [name], MD;  Cobblestoning appearance of distal esophagus, slight narrowing of distal esophagus s/p dilation with 1 Fr and biopsy, gastric erosions biopsied, normal examined duodenum.  Esophageal biopsy with reactive squamous mucosa, gastric biopsy with reactive gastropathy, negative for H. pylori.   ESOPHAGOGASTRODUODENOSCOPY (EGD) WITH PROPOFOL N/A 03/06/2022   Procedure: ESOPHAGOGASTRODUODENOSCOPY  (EGD) WITH PROPOFOL;  Surgeon: [name], MD;  [facility];  Service: Endoscopy;  Laterality: N/A;   EYE SURGERY Bilateral 2018   cataract   GIVENS CAPSULE STUDY N/A 03/13/2022   Procedure: GIVENS CAPSULE STUDY;  Surgeon: [name], MD;  [facility];  Service: Endoscopy;  Laterality: N/A;  7:30am   HERNIA REPAIR     JOINT REPLACEMENT     KNEE SURGERY     Left knee arthroscopy torn medial meniscus grade 4 chondral changes medial femoral condyle tibial plateau   MALONEY DILATION N/A 12/08/2017   Procedure: MALONEY DILATION;  Surgeon: [name], MD;  [facility];  Service: Endoscopy;  Laterality: N/A;   MALONEY DILATION N/A 12/28/2020   Procedure: Venia Minks DILATION;  Surgeon: [name], MD;  [facility];  Service: Endoscopy;  Laterality: N/A;   MALONEY DILATION N/A 07/12/2021   Procedure: Venia Minks DILATION;  Surgeon: [name], MD;  [facility];  Service: Endoscopy;  Laterality: N/A;   PATCH ANGIOPLASTY Right 10/08/2019   Procedure: PATCH ANGIOPLASTY of the right common carotid artery using hemashield plaltinum finesse patch;  Surgeon: Rosetta Posner, MD;  Location: [place] [facility];  Service: Vascular;  Laterality: Right;   PERIPHERAL VASCULAR INTERVENTION  07/17/2020   Procedure: PERIPHERAL VASCULAR INTERVENTION;  Surgeon: Waynetta Sandy, MD;  Location: [facility];  Service: Cardiovascular;;   POLYPECTOMY  12/08/2017   Procedure: POLYPECTOMY;  Surgeon: [name], MD;  [facility];  Service: Endoscopy;;  colon   POLYPECTOMY  03/06/2022   Procedure: POLYPECTOMY;  Surgeon: [name], MD;  [facility];  Service: Endoscopy;;   TOTAL KNEE ARTHROPLASTY Left 04/27/2012   Procedure: TOTAL KNEE ARTHROPLASTY;  Surgeon: Carole Civil, MD;  Location: [facility];  Service: Orthopedics;  Laterality: Left;   TRANSCAROTID ARTERY REVASCULARIZATION  Left 10/12/2021   Procedure:  Left Transcarotid Artery Revascularization;  Surgeon: Waynetta Sandy, MD;  Location: [place];  Service: Vascular;  Laterality: Left;   [place] N/A 07/17/2020   Procedure: mesenteric ANGIOGRAPHY;  Surgeon: Waynetta Sandy, MD;  Location: [facility];  Service: Cardiovascular;  Laterality: N/A;    Current Medications: Current Meds  Medication Sig   albuterol (VENTOLIN HFA) 108 (90 Base) MCG/ACT inhaler Inhale 2 puffs into the lungs every 6 (six) hours as needed for wheezing or shortness of breath.   aspirin EC 81 MG tablet Take 1 tablet (81 mg total) by mouth daily at 6 (six) AM. Swallow whole.   clopidogrel (PLAVIX) 75 MG tablet Take 1 tablet (75 mg total) by mouth daily.   Continuous Blood Gluc Sensor (FREESTYLE LIBRE 3 SENSOR) MISC 1 each by Does not  apply route every 14 (fourteen) days. Place 1 sensor on the skin every 14 days. Use to check glucose continuously   diphenhydrAMINE (BENADRYL) 25 MG tablet Take 25 mg by mouth daily as needed for allergies.    famotidine (PEPCID) 20 MG tablet Take 1 tablet (20 mg total) by mouth 2 (two) times daily.   fenofibrate (TRICOR) 145 MG tablet Take 1 tablet (145 mg total) by mouth daily.   Glucagon (GVOKE HYPOPEN 2-PACK) 1 MG/0.2ML SOAJ Inject 1 mg into the skin as needed (hypoglycemia).   insulin glargine (LANTUS) 100 UNIT/ML injection Inject 0.24 mLs (24 Units total) into the skin daily.   lansoprazole (PREVACID) 30 MG capsule Take 1 capsule (30 mg total) by mouth 2 (two) times daily before a meal.   metFORMIN (GLUCOPHAGE) 1000 MG tablet Take 1 tablet (1,000 mg total) by mouth 2 (two) times daily with a meal.   nitroGLYCERIN (NITROSTAT) 0.4 MG SL tablet Place 1 tablet (0.4 mg total) under the tongue every 5 (five) minutes as needed for chest pain (Do not excieed more than 3 tablets in 15 minutes.).   NOVOLOG 100 UNIT/ML injection Inject 14 Units into the skin 3 (three) times daily  with meals.   rosuvastatin (CRESTOR) 40 MG tablet Take 1 tablet (40 mg total) by mouth daily.   sitaGLIPtin (JANUVIA) 100 MG tablet Take 1 tablet (100 mg total) by mouth daily.   metoprolol tartrate (LOPRESSOR) 25 MG tablet Take 0.5 tablets (12.5 mg total) by mouth 2 (two) times daily.     Allergies:   Patient has no known allergies.   Social History   Socioeconomic History   Marital status: Legally Separated    Spouse name: Not on file   Number of children: 5   Years of education: Not on file   Highest education level: Not on file  Occupational History   Occupation: Disabled    Employer: [employer]  Tobacco Use   Smoking status: Every Day    Packs/day: 0.50    Years: 30.00    Total pack years: 15.00    Types: Cigarettes   Smokeless tobacco: Former    Types: Chew    Quit date: 03/18/2018   Tobacco comments:    0.5 pk per day currently   Vaping Use   Vaping Use: Former  Substance and Sexual Activity   Alcohol use: Not Currently    Comment: socially "beer and tomato juice"    Drug use: No   Sexual activity: Yes    Birth control/protection: None  Other Topics Concern   Not on file  Social History Narrative   Lives alone.    Social Determinants of Health   Financial Resource Strain: Low Risk  (03/29/2022)   Overall Financial Resource Strain (CARDIA)    Difficulty of Paying Living Expenses: Not hard at all  Food Insecurity: No Food Insecurity (03/29/2022)   Hunger Vital Sign    Worried About Running Out of Food in the Last Year: Never true    Ran Out of Food in the Last Year: Never true  Transportation Needs: No Transportation Needs (03/29/2022)   PRAPARE - Hydrologist (Medical): No    Lack of Transportation (Non-Medical): No  Physical Activity: Insufficiently Active (03/29/2022)   Exercise Vital Sign    Days of Exercise per Week: 3 days    Minutes of Exercise per Session: 20 min  Stress: Stress Concern Present (03/29/2022)    [place] -  Occupational Stress Questionnaire    Feeling of Stress : To some extent  Social Connections: Socially Isolated (03/29/2022)   Social Connection and Isolation Panel [NHANES]    Frequency of Communication with Friends and Family: Never    Frequency of Social Gatherings with Friends and Family: Once a week    Attends Religious Services: Never    Marine scientist or Organizations: No    Attends Music therapist: Never    Marital Status: Separated     Family History: The patient's family history includes Cancer in his father, maternal grandmother, and paternal grandmother; Cancer - Lung in his father and maternal grandfather; Colon cancer in some other family members; Colon polyps in his mother; Coronary artery disease in his father; Dementia in his mother; Diabetes in his maternal grandfather; Emphysema in his maternal grandmother; Heart attack in his father and paternal grandfather; Heart disease in his brother; Hypertension in his father and mother; Osteoporosis in his mother.  ROS:   Review of Systems  Constitutional: Negative.   HENT: Negative.    Eyes: Negative.   Respiratory: Negative.    Cardiovascular: Negative.   Gastrointestinal:  Negative for abdominal pain, blood in stool, constipation, diarrhea, heartburn, melena, nausea and vomiting.  Genitourinary: Negative.   Musculoskeletal: Negative.   Skin: Negative.   Neurological: Negative.  Negative for weakness.  Endo/Heme/Allergies: Negative.   Psychiatric/Behavioral: Negative.       Please see the history of present illness.    All other systems reviewed and are negative.  EKGs/Labs/Other Studies Reviewed:    The following studies were reviewed today:   EKG:  EKG is not ordered today.   Cardiac monitor on 03/14/2022: Predominant rhythm is sinus with heart rate ranging from 67 bpm up to 161 bpm and average heart rate 90 bpm. There were rare PACs including  atrial couplets representing less than 1% total beats.  There was a single brief run of SVT lasting 5 beats. There were rare PVCs representing less than 1% total beats.  Four beat run of NSVT versus aberrant SVT was noted. No sustained arrhythmias or pauses.  Echocardiogram on 02/24/2022: 1. Left ventricular ejection fraction, by estimation, is 55 to 60%. The  left ventricle has normal function. The left ventricle has no regional  wall motion abnormalities. Left ventricular diastolic parameters are  indeterminate.   2. Right ventricular systolic function is normal. The right ventricular  size is normal. Tricuspid regurgitation signal is inadequate for assessing  PA pressure.   3. The mitral valve is grossly normal. Trivial mitral valve  regurgitation. No evidence of mitral stenosis.   4. The aortic valve is tricuspid. Aortic valve regurgitation is mild. No  aortic stenosis is present.   5. The inferior vena cava is normal in size with greater than 50%  respiratory variability, suggesting right atrial pressure of 3 mmHg.   Conclusion(s)/Recommendation(s): Normal biventricular function without  evidence of hemodynamically significant valvular heart disease.  Cardiac monitor - results pending   Bilateral carotid duplex on 11/14/2021: Summary:  Right Carotid: Velocities in the right ICA are consistent with a 1-39%  stenosis.   Left Carotid: Patent left ICA stent with no evidence of restenosis.   Vertebrals:  Bilateral vertebral arteries demonstrate antegrade flow.  Subclavians: Normal flow hemodynamics were seen in bilateral subclavian               arteries.   Mesenteric limited on 08/21/2020: Summary:  Mesenteric:  70 to 99% stenosis in the  superior mesenteric artery.  Unable to determine distal end of stent. Stenosis is either at the distal  end of  or distal to the stent.     Vascular US aorta/ IVC/ iliacs doppler on 09/28/2019: Summary:  Abdominal Aorta: The largest  aortic measurement is 2.4 cm. Unable to  identify dissection of infrarenal aorta due to bowel gas.  The left common iliac artery measures approximately 2.25 cm x 2.33 cm,  based on limited visualization due to overlying bowel gas.  2D echocardiogram on 12/11/2018: 1. Left ventricular ejection fraction, by visual estimation, is 60 to  65%. The left ventricle has normal function. There is moderately increased  left ventricular hypertrophy.   2. Left ventricular diastolic Doppler parameters are consistent with  impaired relaxation pattern of LV diastolic filling.   3. Global right ventricle has low normal systolic function.The right  ventricular size is normal. No increase in right ventricular wall  thickness.   4. Left atrial size was normal.   5. Right atrial size was normal.   6. Mild mitral annular calcification.   7. Mild to moderate aortic valve annular calcification.   8. The mitral valve is degenerative. No evidence of mitral valve  regurgitation.   9. The tricuspid valve is grossly normal. Tricuspid valve regurgitation  was not visualized by color flow Doppler.  10. The aortic valve is tricuspid Aortic valve regurgitation was not  visualized by color flow Doppler. Mild aortic valve sclerosis without  stenosis.  11. The pulmonic valve was not well visualized. Pulmonic valve  regurgitation is not visualized by color flow Doppler.  12. The inferior vena cava is normal in size with greater than 50%  respiratory variability, suggesting right atrial pressure of 3 mmHg.   Myoview on 04/23/2013: IMPRESSION:  1. Negative Lexi scan myocardial perfusion imaging stress test   2. Normal left ventricular ejection fraction with normal wall  motion.   3. Low risk study for major cardiovascular events.   Recent Labs: 03/07/2022: ALT 20; BUN 5; Creatinine, Ser 0.81; Platelets 371; Potassium 4.1; Sodium 138 03/27/2022: Hemoglobin 11.8  Recent Lipid Panel    Component Value Date/Time   CHOL  92 02/25/2022 0149   CHOL 96 (L) 09/10/2021 0923   TRIG 230 (H) 02/25/2022 0149   HDL 37 (L) 02/25/2022 0149   HDL 34 (L) 09/10/2021 0923   CHOLHDL 2.5 02/25/2022 0149   VLDL 46 (H) 02/25/2022 0149   LDLCALC 9 02/25/2022 0149   LDLCALC 23 09/10/2021 0923    Physical Exam:    VS:  BP 138/82   Pulse 93   Ht '5\' 7"'$  (1.702 m)   Wt 137 lb 6.4 oz (62.3 kg)   SpO2 98%   BMI 21.52 kg/m     Wt Readings from Last 3 Encounters:  04/11/22 137 lb 6.4 oz (62.3 kg)  04/04/22 137 lb (62.1 kg)  03/29/22 135 lb (61.2 kg)     GEN: Thin, 67 y.o. male in no acute distress HEENT: Normal NECK: No JVD; No carotid bruits on exam CARDIAC: S1/S2, RRR, Grade 2/6 murmur noted on exam, rubs, gallops; 2+ pulses RESPIRATORY:  Clear and diminished to auscultation without rales, wheezing or rhonchi  MUSCULOSKELETAL:  No edema; No deformity  SKIN: Warm and dry NEUROLOGIC:  Alert and oriented x 3 PSYCHIATRIC:  Normal affect   ASSESSMENT:    1. Coronary artery disease involving native coronary artery of native heart without angina pectoris   2. PAD (peripheral artery disease) ([place])  3. Hyperlipidemia, unspecified hyperlipidemia type   4. Hypertriglyceridemia   5. Murmur   6. Tobacco abuse     PLAN:    In order of problems listed above:  CAD, s/p NSTEMI and s/p GI bleed  Admitted 02/2022 with NSTEMI in setting of anemia d/t GI bleed with positive hemoccult (demand ischemia). It was decided that could not undergo cardiac cath until anemia was resolved. Capsule endoscopy results showed no evidence of active bleeding. Upper endoscopy and lower endoscopy were overall unremarkable and did not show source of bleeding. Stable with no anginal symptoms. No indication for ischemic evaluation at this time.  Continue current medication regimen, increasing Metoprolol Tartrate to 25 mg BID. Heart healthy diet and regular cardiovascular exercise encouraged. ED precautions discussed. Continue to follow with PCP. If his  chest pain were to recur, would have low threshold for considering pursuing cardiac catheterization.   PAD He is s/p right CEA in 2021, stenting to SMA in 2022,as well as TCAR (L) in 10/2021. Continue current medication regimen. Follow-up with [facility]. Heart healthy diet and regular cardiovascular exercise encouraged.   HLD, hypertriglyceridemia Labs from 02/2022 revealed total cholesterol 92, HDL 37, LDL 9, and triglycerides 230.  Continue current medication regimen. Heart healthy diet and regular cardiovascular exercise encouraged.   Murmur Grade 2/6 systolic murmur noted on exam. TTE 02/2022 revealed EF 55-60%, no WMA's, trivial MR, mild AR.  Heart healthy diet and regular cardiovascular exercise encouraged.   6. Tobacco abuse Still smoking. Very motivated to quit smoking and he is cutting back. Previously wrote a Nicotine patch Rx for when he is ready to quit. Smoking cessation encouraged and discussed.   7. Disposition: Follow-up with Dr. Domenic Polite in 6 months or sooner if anything changes.    Medication Adjustments/Labs and Tests Ordered: Current medicines are reviewed at length with the patient today.  Concerns regarding medicines are outlined above.  No orders of the defined types were placed in this encounter.  Meds ordered this encounter  Medications   metoprolol tartrate (LOPRESSOR) 25 MG tablet    Sig: Take 1 tablet (25 mg total) by mouth 2 (two) times daily.    Dispense:  60 tablet    Refill:  6    Dose increased 04/11/2022    Patient Instructions  Medication Instructions:  Increase Lopressor to '25mg'$  twice a day   Continue all other medications.     Labwork: none  Testing/Procedures: none  Follow-Up: 6 months   Any Other Special Instructions Will Be Listed Below (If Applicable).   If you need a refill on your cardiac medications before your next appointment, please call your pharmacy.    Signed, Finis Bud, NP  04/11/2022 4:54 PM    [place]

## 2022-04-11 NOTE — Patient Instructions (Addendum)
Medication Instructions:  Increase Lopressor to '25mg'$  twice a day   Continue all other medications.     Labwork: none  Testing/Procedures: none  Follow-Up: 6 months   Any Other Special Instructions Will Be Listed Below (If Applicable).   If you need a refill on your cardiac medications before your next appointment, please call your pharmacy.

## 2022-04-12 LAB — IRON,TIBC AND FERRITIN PANEL
Ferritin: 105 ng/mL (ref 30–400)
Iron Saturation: 13 % — ABNORMAL LOW (ref 15–55)
Iron: 61 ug/dL (ref 38–169)
Total Iron Binding Capacity: 454 ug/dL — ABNORMAL HIGH (ref 250–450)
UIBC: 393 ug/dL — ABNORMAL HIGH (ref 111–343)

## 2022-04-12 LAB — HEMOGLOBIN A1C
Est. average glucose Bld gHb Est-mCnc: 214 mg/dL
Hgb A1c MFr Bld: 9.1 % — ABNORMAL HIGH (ref 4.8–5.6)

## 2022-04-15 ENCOUNTER — Other Ambulatory Visit: Payer: Self-pay

## 2022-04-15 DIAGNOSIS — D509 Iron deficiency anemia, unspecified: Secondary | ICD-10-CM

## 2022-04-18 ENCOUNTER — Encounter: Payer: Self-pay | Admitting: Internal Medicine

## 2022-04-18 ENCOUNTER — Telehealth (INDEPENDENT_AMBULATORY_CARE_PROVIDER_SITE_OTHER): Payer: Medicare HMO | Admitting: Internal Medicine

## 2022-04-18 DIAGNOSIS — E1159 Type 2 diabetes mellitus with other circulatory complications: Secondary | ICD-10-CM | POA: Diagnosis not present

## 2022-04-18 NOTE — Assessment & Plan Note (Signed)
Evaluated today for diabetes follow-up. Lantus was increased to 24 units and Januvia added at his last appointment.  He reports that he was never able to fill the prescription for Januvia due to cost and is currently out of Lantus and unable to refill the medication until next month due to cost. -No medication changes today.  I have asked that he contact his insurance company to review his current medications and ask which medications are preferred.  We will also look at their assistance programs.  He will contact us after he speaks with his insurance company and we will make appropriate medication changes. -Tentatively plan for follow-up in 2 months

## 2022-04-18 NOTE — Progress Notes (Signed)
Virtual Visit via Video Note  I connected with Jimmy Duran on 04/18/22 at  4:40 PM EST by a video enabled telemedicine application and verified that I am speaking with the correct person using two identifiers.  Patient Location: Home Provider Location: Office/Clinic  I discussed the limitations, risks, security, and privacy concerns of performing an evaluation and management service by video and the availability of in person appointments. I also discussed with the patient that there may be a patient responsible charge related to this service. The patient expressed understanding and agreed to proceed.  Subjective: PCP: [name], MD  Chief Complaint  Patient presents with   Diabetes    Follow up   Jimmy Duran been evaluated through video encounter today for diabetes follow-up.  He was last seen by me on 2/1 at which time he reported that his recent blood sugar readings have been significantly elevated.  Lantus was increased to 24 units nightly and Januvia 100 mg daily was added.  In the interval he Duran been seen by cardiology for follow-up.  Today Jimmy Duran reports that his blood sugars temporarily improved with increasing Lantus.  He states his AM sugar improved to as low as 110.  Unfortunately, he Duran been out of Lantus recently and cannot afford to fill his prescription until next month.  This Duran been a recurring problem with other medicines as well.  He Duran no additional concerns to discuss today.   ROS: Per HPI  Current Outpatient Medications:    albuterol (VENTOLIN HFA) 108 (90 Base) MCG/ACT inhaler, Inhale 2 puffs into the lungs every 6 (six) hours as needed for wheezing or shortness of breath., Disp: 8 g, Rfl: 2   aspirin EC 81 MG tablet, Take 1 tablet (81 mg total) by mouth daily at 6 (six) AM. Swallow whole., Disp: 30 tablet, Rfl: 12   clopidogrel (PLAVIX) 75 MG tablet, Take 1 tablet (75 mg total) by mouth daily., Disp: 30 tablet, Rfl: 11   Continuous Blood  Gluc Sensor (FREESTYLE LIBRE 3 SENSOR) MISC, 1 each by Does not apply route every 14 (fourteen) days. Place 1 sensor on the skin every 14 days. Use to check glucose continuously, Disp: 2 each, Rfl: 2   diphenhydrAMINE (BENADRYL) 25 MG tablet, Take 25 mg by mouth daily as needed for allergies. , Disp: , Rfl:    famotidine (PEPCID) 20 MG tablet, Take 1 tablet (20 mg total) by mouth 2 (two) times daily., Disp: 60 tablet, Rfl: 3   fenofibrate (TRICOR) 145 MG tablet, Take 1 tablet (145 mg total) by mouth daily., Disp: 90 tablet, Rfl: 3   Glucagon (GVOKE HYPOPEN 2-PACK) 1 MG/0.2ML SOAJ, Inject 1 mg into the skin as needed (hypoglycemia)., Disp: 0.2 mL, Rfl: 1   insulin glargine (LANTUS) 100 UNIT/ML injection, Inject 0.24 mLs (24 Units total) into the skin daily., Disp: 10 mL, Rfl: PRN   lansoprazole (PREVACID) 30 MG capsule, Take 1 capsule (30 mg total) by mouth 2 (two) times daily before a meal., Disp: 60 capsule, Rfl: 3   metFORMIN (GLUCOPHAGE) 1000 MG tablet, Take 1 tablet (1,000 mg total) by mouth 2 (two) times daily with a meal., Disp: 60 tablet, Rfl: 3   metoprolol tartrate (LOPRESSOR) 25 MG tablet, Take 1 tablet (25 mg total) by mouth 2 (two) times daily., Disp: 60 tablet, Rfl: 6   nitroGLYCERIN (NITROSTAT) 0.4 MG SL tablet, Place 1 tablet (0.4 mg total) under the tongue every 5 (five) minutes as needed for  chest pain (Do not excieed more than 3 tablets in 15 minutes.)., Disp: 25 tablet, Rfl: 3   NOVOLOG 100 UNIT/ML injection, Inject 14 Units into the skin 3 (three) times daily with meals., Disp: 10 mL, Rfl: 11   rosuvastatin (CRESTOR) 40 MG tablet, Take 1 tablet (40 mg total) by mouth daily., Disp: 90 tablet, Rfl: 3   sitaGLIPtin (JANUVIA) 100 MG tablet, Take 1 tablet (100 mg total) by mouth daily., Disp: 30 tablet, Rfl: 2  Assessment and Plan: DM type 2 causing vascular disease ([place]) Assessment & Plan: Evaluated today for diabetes follow-up. Lantus was increased to 24 units and Januvia added at  his last appointment.  He reports that he was never able to fill the prescription for Januvia due to cost and is currently out of Lantus and unable to refill the medication until next month due to cost. -No medication changes today.  I have asked that he contact his insurance company to review his current medications and ask which medications are preferred.  We will also look at their assistance programs.  He will contact us after he speaks with his insurance company and we will make appropriate medication changes. -Tentatively plan for follow-up in 2 months    Follow Up Instructions: Return in about 2 months (around 06/17/2022).   I discussed the assessment and treatment plan with the patient. The patient was provided an opportunity to ask questions, and all were answered. The patient agreed with the plan and demonstrated an understanding of the instructions.   The patient was advised to call back or seek an in-person evaluation if the symptoms worsen or if the condition fails to improve as anticipated.  The above assessment and management plan was discussed with the patient. The patient verbalized understanding of and Duran agreed to the management plan.   [name], MD

## 2022-04-18 NOTE — Addendum Note (Signed)
Addended by: [name] on: 04/18/2022 05:37 PM   Modules accepted: Level of Service

## 2022-04-24 ENCOUNTER — Encounter: Payer: Self-pay | Admitting: Internal Medicine

## 2022-04-24 IMAGING — RF DG ESOPHAGUS
7 of 8 series · 12 of 24 positions shown · non-contrast
Comparison: None

CLINICAL DATA: Recurrent dysphagia, difficulty swallowing solids
and liquids, esophageal dilatation 2 months ago

EXAM:
ESOPHOGRAM / BARIUM SWALLOW / BARIUM TABLET STUDY
TECHNIQUE: Combined double contrast and single contrast examination performed
using effervescent crystals, thick barium liquid, and thin barium
liquid. The patient was observed with fluoroscopy swallowing a 13 mm
barium sulphate tablet.
FLUOROSCOPY:
Radiation Exposure Index (as provided by the fluoroscopic device):
52.2 mGy Kerma

[Series 1: cp_standard · 0.18mm/px · 2 of 126 frames shown (1 of 7)]
[frame 45/126]
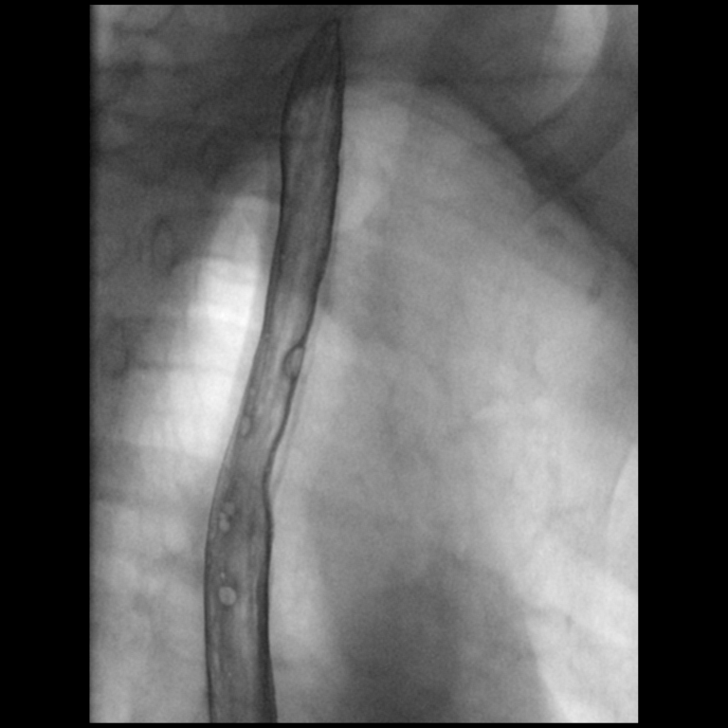
[frame 108/126]
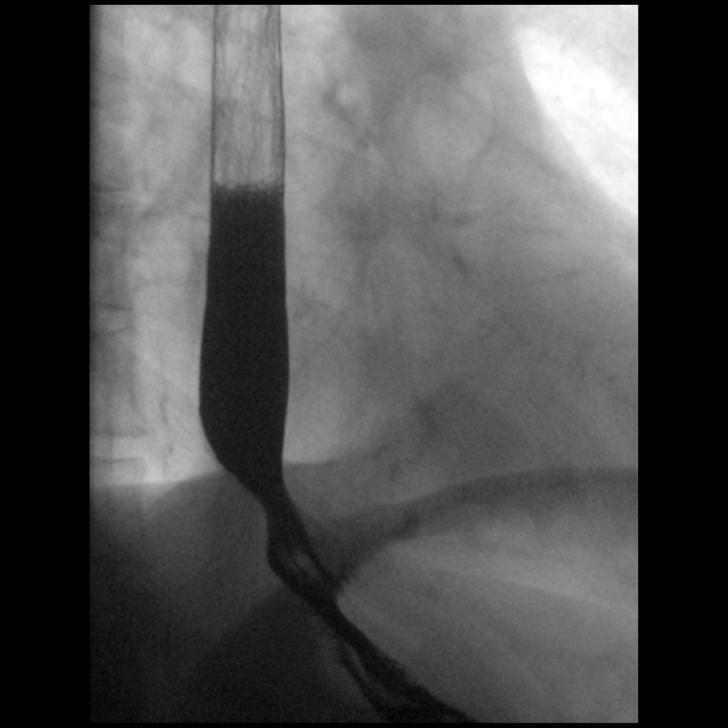

[Series 2: cp_standard · 0.18mm/px · 1 of 120 frames shown (2 of 7)]
[frame 46/120]
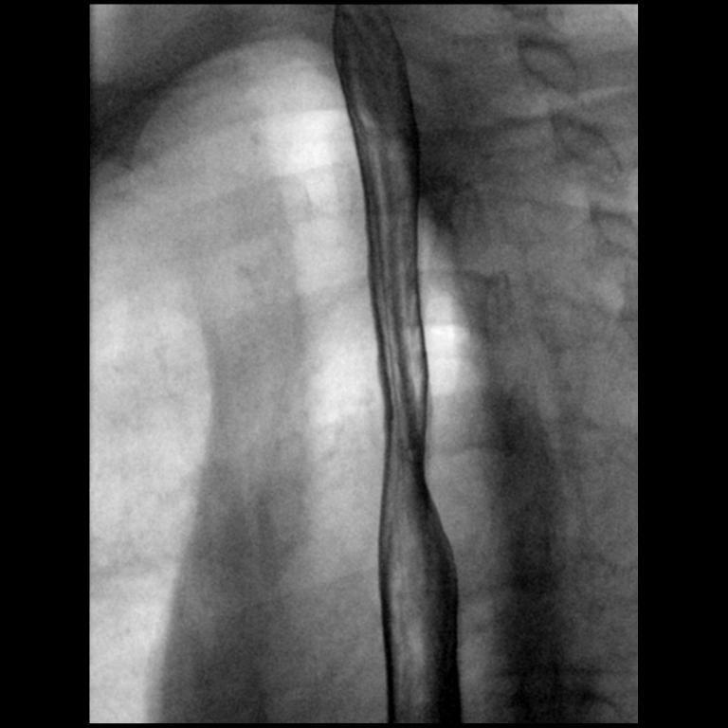

[Series 3: cp_standard · 0.27mm/px · 2 of 142 frames shown (3 of 7)]
[frame 22/142]
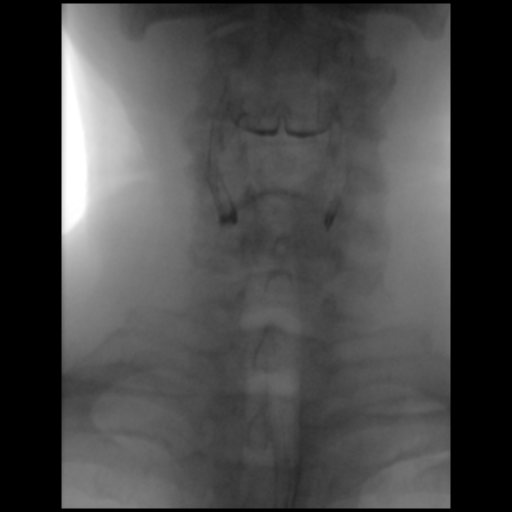
[frame 77/142]
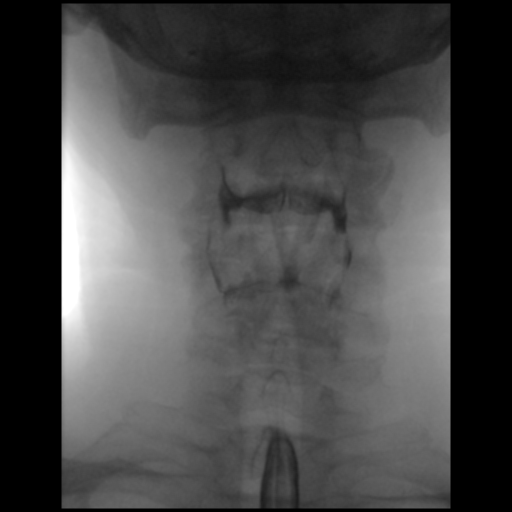

[Series 4: cp_standard · 0.27mm/px · 2 of 100 frames shown (4 of 7)]
[frame 5/100]
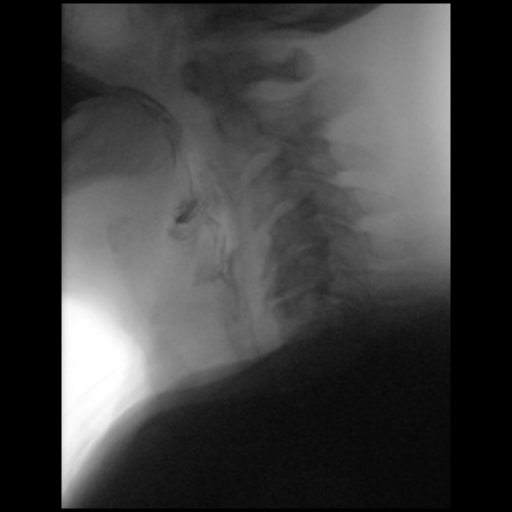
[frame 51/100]
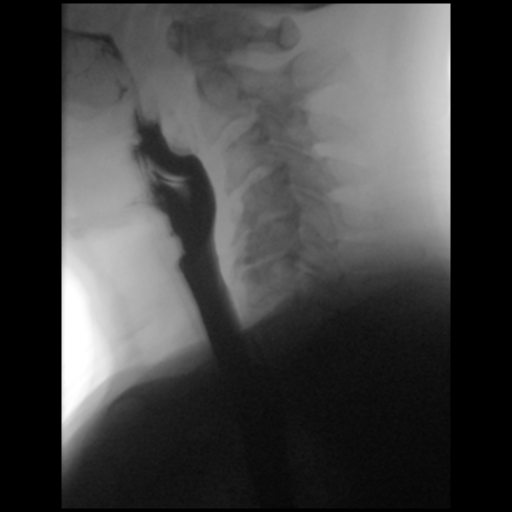

[Series 5: cp_standard · 0.17mm/px · 2 of 143 frames shown (5 of 7)]
[frame 22/143]
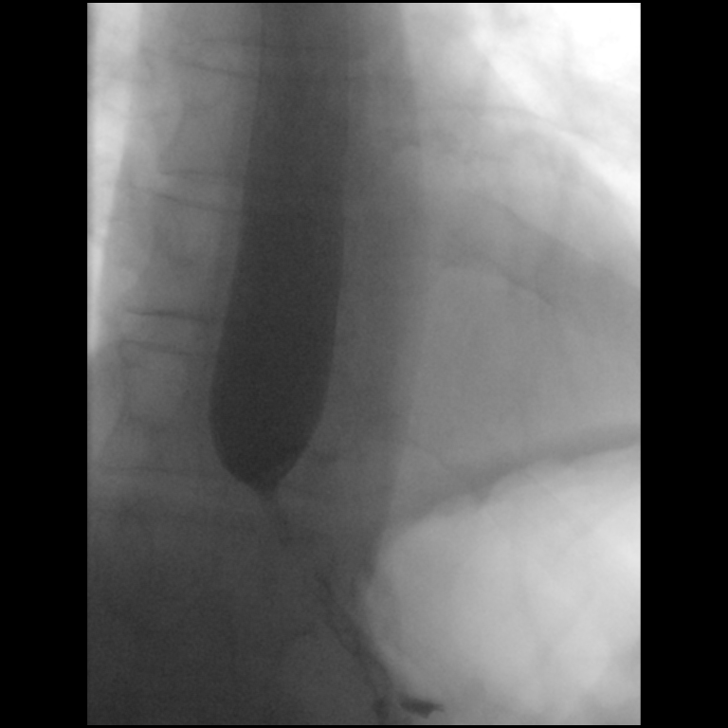
[frame 101/143]
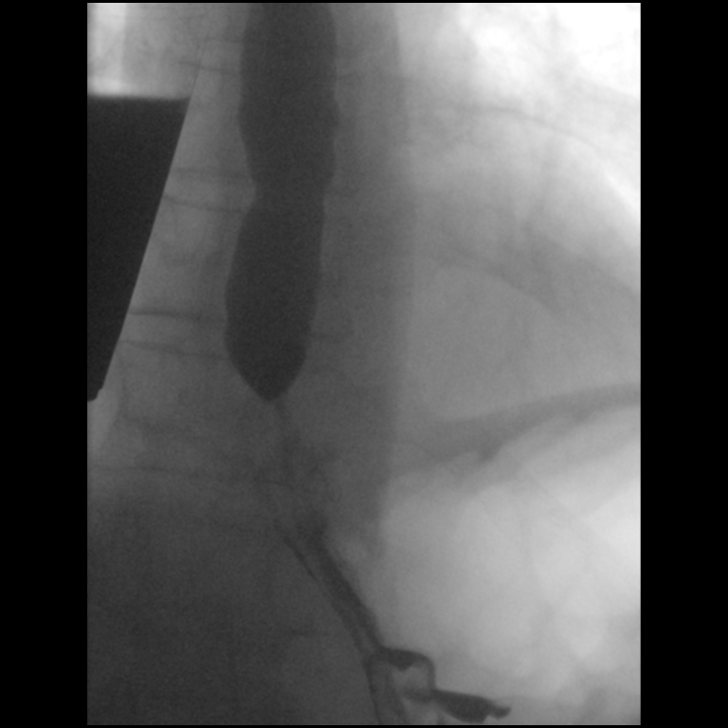

[Series 7: cp_standard · 0.17mm/px · 2 of 79 frames shown (6 of 7)]
[frame 12/79]
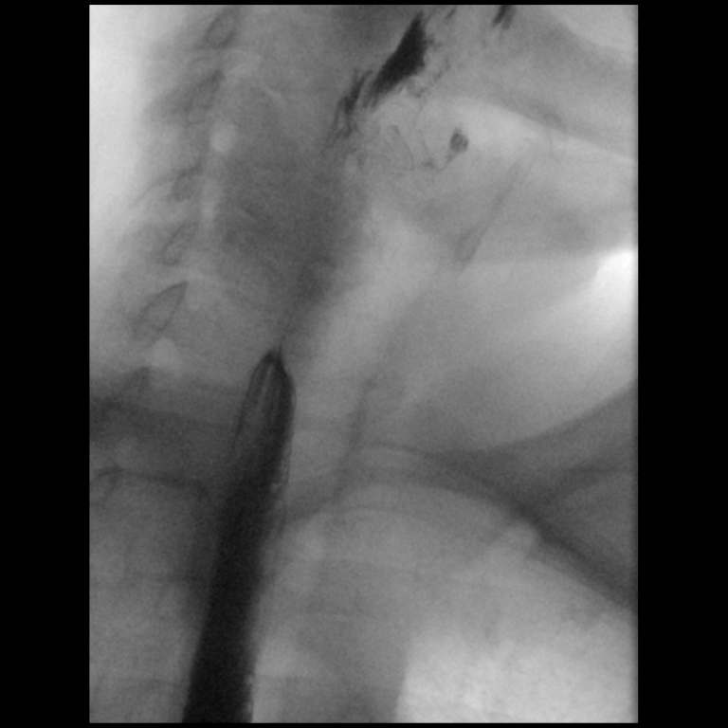
[frame 68/79]
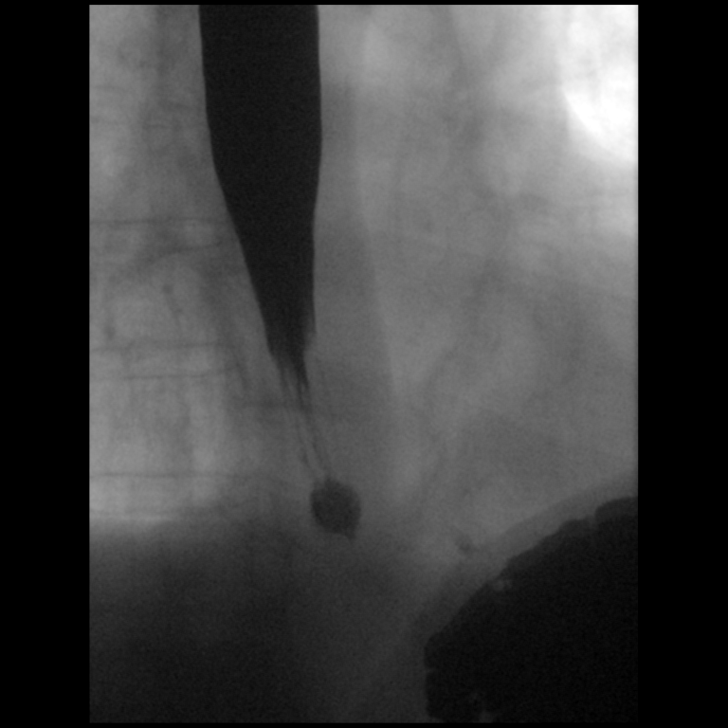

[Series 8: cp_standard · 0.18mm/px · 1 of 1 slices shown (7 of 7)]
[im 1/1]
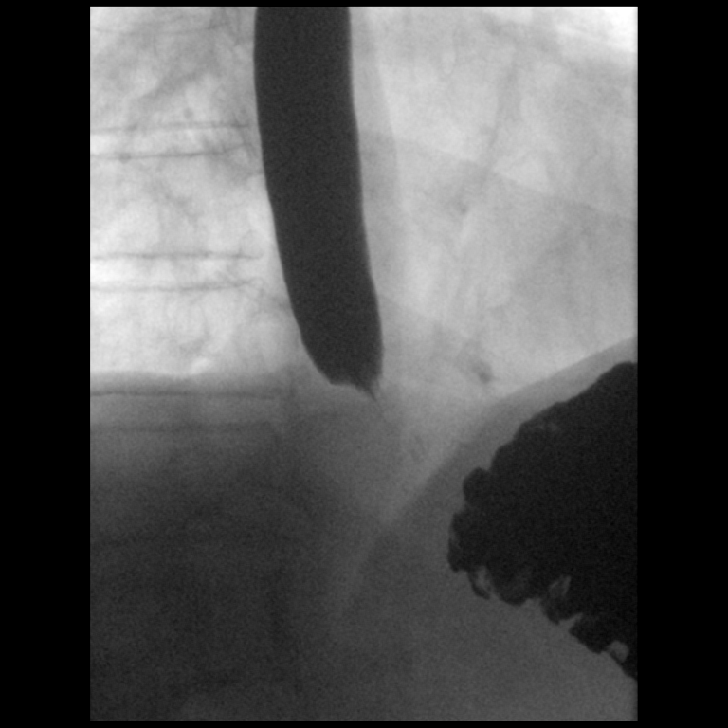

[12 of 24 positions shown; findings below may reference images not displayed]

FINDINGS: Esophageal distention: Smooth narrowing at the gastroesophageal
junction. No additional areas of narrowing or mass.

Filling defects:  None

12.5 mm barium tablet: Obstructed at GE junction abdomen would not
pass despite multiple swallows of water and barium.

Motility:  Age-related dysmotility

Mucosa:  Smooth without irregularity or ulceration

Hypopharynx/cervical esophagus: No laryngeal penetration or
aspiration. Minimal vallecular residuals cleared by spontaneous
second swallow

Hiatal hernia:  Absent

GE reflux:  Not witnessed during exam

Other:  Degenerative disc disease changes cervical spine.
IMPRESSION: Smooth narrowing at gastroesophageal junction, obstructing a 12.5 mm
diameter barium tablet.

## 2022-04-25 ENCOUNTER — Ambulatory Visit: Payer: Medicare HMO | Admitting: Nurse Practitioner

## 2022-04-26 ENCOUNTER — Telehealth: Payer: Self-pay

## 2022-04-26 NOTE — Telephone Encounter (Signed)
Labs results from [facility] scanned under media for review.

## 2022-05-02 ENCOUNTER — Ambulatory Visit: Payer: Medicare HMO | Admitting: Internal Medicine

## 2022-05-09 NOTE — Telephone Encounter (Signed)
Hgb now normal at 12.8. Ferritin low normal at 21.5.  Please have him keep upcoming ov with Dr. Gala Romney.

## 2022-05-09 NOTE — Telephone Encounter (Signed)
Pt's wife Estill Bamberg (DPR on file) was made aware and verbalized understanding.

## 2022-05-24 ENCOUNTER — Ambulatory Visit: Payer: Medicare HMO | Admitting: Internal Medicine

## 2022-05-28 ENCOUNTER — Encounter: Payer: Self-pay | Admitting: Internal Medicine

## 2022-05-28 ENCOUNTER — Ambulatory Visit: Payer: Medicare HMO | Admitting: Internal Medicine

## 2022-05-28 VITALS — BP 176/93 | HR 76 | Temp 98.0°F | Ht 67.0 in | Wt 139.2 lb

## 2022-05-28 DIAGNOSIS — K5909 Other constipation: Secondary | ICD-10-CM

## 2022-05-28 DIAGNOSIS — Z86010 Personal history of colonic polyps: Secondary | ICD-10-CM

## 2022-05-28 DIAGNOSIS — D509 Iron deficiency anemia, unspecified: Secondary | ICD-10-CM | POA: Diagnosis not present

## 2022-05-28 DIAGNOSIS — K2100 Gastro-esophageal reflux disease with esophagitis, without bleeding: Secondary | ICD-10-CM

## 2022-05-28 NOTE — Progress Notes (Unsigned)
Primary Care Physician:  [name], MD Primary Gastroenterologist:  Dr.   Pre-Procedure History & Physical: HPI:  Jimmy Duran is a 67 y.o. male here for small polyp found colonoscopy in January of this year.  EGD essentially negative.  Long history of heavy aspirin powder use.  No longer using such agents.  Clinically, he is doing well now.  Occasionally constipated no rectal bleeding or melena.  Reflux well-controlled on twice daily lansoprazole.  No dysphagia.  Due for repeat colonoscopy in 7 years for surveillance.  He is feeling well he is accompanied by his wife today.  He essentially has no other GI complaints.  Past Medical History:  Diagnosis Date   AAA (abdominal aortic aneurysm) (HCC)    Arthritis    Carotid artery disease (HCC)    Cataract    Colon polyp    COPD (chronic obstructive pulmonary disease) (HCC)    Coronary artery disease    Nonobstructive   Essential hypertension    GERD (gastroesophageal reflux disease)    Hemorrhoids    History of TIA (transient ischemic attack)    Low back pain    Lumbar radiculopathy    Mixed hyperlipidemia    Palpitations    Peripheral vascular disease ([place])    Shingles 09/23/2019   Stroke ([place])    Tobacco dependence    Type 2 diabetes mellitus ([place])     Past Surgical History:  Procedure Laterality Date   APPENDECTOMY  1970   BIOPSY  12/08/2017   Procedure: BIOPSY;  Surgeon: [name], MD;  [facility];  Service: Endoscopy;;  esophagus    BIOPSY  12/28/2020   Procedure: BIOPSY;  Surgeon: [name], MD;  [facility];  Service: Endoscopy;;   BIOPSY  07/12/2021   Procedure: BIOPSY;  Surgeon: [name], MD;  [facility];  Service: Endoscopy;;   BREAST SURGERY Right    Cyst resection on the right   CARDIAC CATHETERIZATION  1990's   in [place]. no stent placement   CATARACT EXTRACTION W/PHACO Right 06/18/2016   Procedure: CATARACT EXTRACTION PHACO AND  INTRAOCULAR LENS PLACEMENT ([place]);  Surgeon: [name], MD;  Location: [facility];  Service: Ophthalmology;  Laterality: Right;  [name] 35.7 AP% 16.6 CDE 5.94 Fluid pack lot # [id number]   CATARACT EXTRACTION W/PHACO Left 07/16/2016   Procedure: CATARACT EXTRACTION PHACO AND INTRAOCULAR LENS PLACEMENT ([facility]);  Surgeon: [name], MD;  Location: [facility];  Service: Ophthalmology;  Laterality: Left;  [name] 51.8 AP% 12.7 CDE 6.58 Fluid Pack Lot # [id number]   CHOLECYSTECTOMY  1993   COLONOSCOPY  02/12/2007   [id number] friable anal canal hemorrhoids, otherwise normal rectum and colon   COLONOSCOPY N/A 07/16/2012   RMR: Colonic diverticulosis. 4 mm tubular adenoma   COLONOSCOPY WITH PROPOFOL N/A 12/08/2017   Dr. Gala Romney: sigmoid and descending colon diverticulosis, transverse colon polyp (TUBULAR ADENOMA)   COLONOSCOPY WITH PROPOFOL N/A 03/06/2022   Procedure: COLONOSCOPY WITH PROPOFOL;  Surgeon: [name], MD;  [facility];  Service: Endoscopy;  Laterality: N/A;  12:30 PM   ENDARTERECTOMY Right 10/08/2019   Procedure: RIGHT CAROTID ENDARTERECTOMY;  Surgeon: Rosetta Posner, MD;  Location: [facility];  Service: Vascular;  Laterality: Right;   ESOPHAGOGASTRODUODENOSCOPY (EGD) WITH ESOPHAGEAL DILATION N/A 07/16/2012   RMR: +Candida esophagitis, Erosive reflux esophagiits. Schatizi's ring status post dilation. Hiatal hernia. Antral erosions status post biopsy.    ESOPHAGOGASTRODUODENOSCOPY (EGD) WITH PROPOFOL N/A  12/08/2017   Dr. Gala Romney: esophagitis, query EOE but negative for increased eosinophils on path, small hiatal hernia, normal stomach, normal duodenum, PAS stain with rare yeast forms on stain. Empiric dilation   ESOPHAGOGASTRODUODENOSCOPY (EGD) WITH PROPOFOL N/A 12/28/2020   esophagitis without bleeding, s/p biopsy and dilation. Antral erosions s/p biopsy.   ESOPHAGOGASTRODUODENOSCOPY (EGD) WITH PROPOFOL N/A 07/12/2021   Surgeon: [name], MD;  Cobblestoning appearance  of distal esophagus, slight narrowing of distal esophagus s/p dilation with 56 Fr and biopsy, gastric erosions biopsied, normal examined duodenum.  Esophageal biopsy with reactive squamous mucosa, gastric biopsy with reactive gastropathy, negative for H. pylori.   ESOPHAGOGASTRODUODENOSCOPY (EGD) WITH PROPOFOL N/A 03/06/2022   Procedure: ESOPHAGOGASTRODUODENOSCOPY (EGD) WITH PROPOFOL;  Surgeon: [name], MD;  [facility];  Service: Endoscopy;  Laterality: N/A;   EYE SURGERY Bilateral 2018   cataract   GIVENS CAPSULE STUDY N/A 03/13/2022   Procedure: GIVENS CAPSULE STUDY;  Surgeon: [name], MD;  [facility];  Service: Endoscopy;  Laterality: N/A;  7:30am   HERNIA REPAIR     JOINT REPLACEMENT     KNEE SURGERY     Left knee arthroscopy torn medial meniscus grade 4 chondral changes medial femoral condyle tibial plateau   MALONEY DILATION N/A 12/08/2017   Procedure: MALONEY DILATION;  Surgeon: [name], MD;  [facility];  Service: Endoscopy;  Laterality: N/A;   MALONEY DILATION N/A 12/28/2020   Procedure: Venia Minks DILATION;  Surgeon: [name], MD;  [facility];  Service: Endoscopy;  Laterality: N/A;   MALONEY DILATION N/A 07/12/2021   Procedure: Venia Minks DILATION;  Surgeon: [name], MD;  [facility];  Service: Endoscopy;  Laterality: N/A;   PATCH ANGIOPLASTY Right 10/08/2019   Procedure: PATCH ANGIOPLASTY of the right common carotid artery using hemashield plaltinum finesse patch;  Surgeon: Rosetta Posner, MD;  Location: [place] [facility];  Service: Vascular;  Laterality: Right;   PERIPHERAL VASCULAR INTERVENTION  07/17/2020   Procedure: PERIPHERAL VASCULAR INTERVENTION;  Surgeon: Waynetta Sandy, MD;  Location: [facility] [facility];  Service: Cardiovascular;;   POLYPECTOMY  12/08/2017   Procedure: POLYPECTOMY;  Surgeon: [name], MD;  [facility];  Service: Endoscopy;;  colon   POLYPECTOMY   03/06/2022   Procedure: POLYPECTOMY;  Surgeon: [name], MD;  [facility];  Service: Endoscopy;;   TOTAL KNEE ARTHROPLASTY Left 04/27/2012   Procedure: TOTAL KNEE ARTHROPLASTY;  Surgeon: Carole Civil, MD;  Location: [facility];  Service: Orthopedics;  Laterality: Left;   TRANSCAROTID ARTERY REVASCULARIZATION  Left 10/12/2021   Procedure: Left Transcarotid Artery Revascularization;  Surgeon: Waynetta Sandy, MD;  Location: [place];  Service: Vascular;  Laterality: Left;   [place] N/A 07/17/2020   Procedure: mesenteric ANGIOGRAPHY;  Surgeon: Waynetta Sandy, MD;  Location: [place] [facility];  Service: Cardiovascular;  Laterality: N/A;    Prior to Admission medications   Medication Sig Start Date End Date Taking? Authorizing Provider  albuterol (VENTOLIN HFA) 108 (90 Base) MCG/ACT inhaler Inhale 2 puffs into the lungs every 6 (six) hours as needed for wheezing or shortness of breath. 08/09/21  Yes Paseda, Dewaine Conger, FNP  aspirin EC 81 MG tablet Take 1 tablet (81 mg total) by mouth daily at 6 (six) AM. Swallow whole. 10/14/21  Yes Rhyne, Hulen Shouts, PA-C  clopidogrel (PLAVIX) 75 MG tablet Take 1  tablet (75 mg total) by mouth daily. 08/09/21 08/09/22 Yes Paseda, Dewaine Conger, FNP  Continuous Blood Gluc Sensor (FREESTYLE LIBRE 3 SENSOR) MISC 1 each by Does not apply route every 14 (fourteen) days. Place 1 sensor on the skin every 14 days. Use to check glucose continuously 12/19/21  Yes [name], MD  diphenhydrAMINE (BENADRYL) 25 MG tablet Take 25 mg by mouth daily as needed for allergies.    Yes [provider]  famotidine (PEPCID) 20 MG tablet Take 1 tablet (20 mg total) by mouth 2 (two) times daily. 05/08/21  Yes Annitta Needs, NP  fenofibrate (TRICOR) 145 MG tablet Take 1 tablet (145 mg total) by mouth daily. 03/11/22  Yes Finis Bud, NP  Glucagon (GVOKE HYPOPEN 2-PACK) 1 MG/0.2ML SOAJ Inject 1 mg into  the skin as needed (hypoglycemia). 12/11/21  Yes [name], MD  insulin glargine (LANTUS) 100 UNIT/ML injection Inject 0.24 mLs (24 Units total) into the skin daily. 04/04/22  Yes [name], MD  lansoprazole (PREVACID) 30 MG capsule Take 1 capsule (30 mg total) by mouth 2 (two) times daily before a meal. 10/19/21  Yes Aliene Altes S, PA-C  metFORMIN (GLUCOPHAGE) 1000 MG tablet Take 1 tablet (1,000 mg total) by mouth 2 (two) times daily with a meal. 09/11/21  Yes Paseda, Dewaine Conger, FNP  metoprolol tartrate (LOPRESSOR) 25 MG tablet Take 1 tablet (25 mg total) by mouth 2 (two) times daily. 04/11/22  Yes Finis Bud, NP  MOUNJARO 2.5 MG/0.5ML Pen Inject into the skin. 04/24/22  Yes [provider]  nitroGLYCERIN (NITROSTAT) 0.4 MG SL tablet Place 1 tablet (0.4 mg total) under the tongue every 5 (five) minutes as needed for chest pain (Do not excieed more than 3 tablets in 15 minutes.). 03/11/22 06/09/22 Yes Finis Bud, NP  NOVOLOG 100 UNIT/ML injection Inject 14 Units into the skin 3 (three) times daily with meals. 01/08/22  Yes [name], MD  rosuvastatin (CRESTOR) 40 MG tablet Take 1 tablet (40 mg total) by mouth daily. 08/14/21  Yes Satira Sark, MD  sitaGLIPtin (JANUVIA) 100 MG tablet Take 1 tablet (100 mg total) by mouth daily. 04/04/22 07/03/22 Yes [name], MD    Allergies as of 05/28/2022   (No Known Allergies)    Family History  Problem Relation Age of Onset   Dementia Mother    Hypertension Mother    Colon polyps Mother    Osteoporosis Mother    Cancer Father    Hypertension Father    Coronary artery disease Father        CABG in his 2's   Heart attack Father    Cancer - Lung Father    Heart disease Brother        has a pacemaker   Emphysema Maternal Grandmother    Cancer Maternal Grandmother    Cancer - Lung Maternal Grandfather    Diabetes Maternal Grandfather    Cancer Paternal Grandmother    Heart attack Paternal Grandfather     Colon cancer Other        maternal great aunt   Colon cancer Other        paternal great aunt    Social History   Socioeconomic History   Marital status: Legally Separated    Spouse name: Not on file   Number of children: 5   Years of education: Not on file   Highest education level: Not on file  Occupational History   Occupation: Disabled  Employer: [employer]  Tobacco Use   Smoking status: Every Day    Packs/day: 0.50    Years: 30.00    Additional pack years: 0.00    Total pack years: 15.00    Types: Cigarettes   Smokeless tobacco: Former    Types: Chew    Quit date: 03/18/2018   Tobacco comments:    0.5 pk per day currently   Vaping Use   Vaping Use: Former  Substance and Sexual Activity   Alcohol use: Not Currently    Comment: socially "beer and tomato juice"    Drug use: No   Sexual activity: Yes    Birth control/protection: None  Other Topics Concern   Not on file  Social History Narrative   Lives alone.    Social Determinants of Health   Financial Resource Strain: Low Risk  (03/29/2022)   Overall Financial Resource Strain (CARDIA)    Difficulty of Paying Living Expenses: Not hard at all  Food Insecurity: No Food Insecurity (03/29/2022)   Hunger Vital Sign    Worried About Running Out of Food in the Last Year: Never true    Ran Out of Food in the Last Year: Never true  Transportation Needs: No Transportation Needs (03/29/2022)   PRAPARE - Hydrologist (Medical): No    Lack of Transportation (Non-Medical): No  Physical Activity: Insufficiently Active (03/29/2022)   Exercise Vital Sign    Days of Exercise per Week: 3 days    Minutes of Exercise per Session: 20 min  Stress: Stress Concern Present (03/29/2022)   [place]    Feeling of Stress : To some extent  Social Connections: Socially Isolated (03/29/2022)   Social Connection and Isolation Panel  [NHANES]    Frequency of Communication with Friends and Family: Never    Frequency of Social Gatherings with Friends and Family: Once a week    Attends Religious Services: Never    Marine scientist or Organizations: No    Attends Archivist Meetings: Never    Marital Status: Separated  Intimate Partner Violence: Not At Risk (03/29/2022)   Humiliation, Afraid, Rape, and Kick questionnaire    Fear of Current or Ex-Partner: No    Emotionally Abused: No    Physically Abused: No    Sexually Abused: No    Review of Systems: See HPI, otherwise negative ROS  Physical Exam: BP (!) 163/84 (BP Location: Right Arm, Patient Position: Sitting, Cuff Size: Normal)   Pulse 76   Temp 98 F (36.7 C) (Oral)   Ht 5\' 7"  (1.702 m)   Wt 139 lb 3.2 oz (63.1 kg)   SpO2 96%   BMI 21.80 kg/m  General:   Alert,   pleasant and cooperative in NAD Neck:  Supple; no masses or thyromegaly. No significant cervical adenopathy. Lungs:  Clear throughout to auscultation.   No wheezes, crackles, or rhonchi. No acute distress. Heart:  Regular rate and rhythm; no murmurs, clicks, rubs,  or gallops. Abdomen: Non-distended, normal bowel sounds.  Soft and nontender without appreciable mass or hepatosplenomegaly.   Impression/Plan: 67 year old gentleman with a history of iron deficiency anemia in the setting of heavy aspirin powder use.  Recent colonoscopy and EGD as outlined above.  Clinically, doing well ferritin now in the normal range at 21.  History of colonic adenoma; due for surveillance in 7 years.  GERD well-controlled on vi PPI therapy.  Recommendations:  Continue  lansoprazole 30 mg twice daily before meals.  For repeat colonoscopy in 7 years.  Avoid aspirin containing products as much as possible  Try MiraLAX powder 1 capful in 8 ounces of water nightly as needed for constipation.  Follow-up office visit here in 1 year and as needed                  Notice: This dictation  was prepared with Dragon dictation along with smaller phrase technology. Any transcriptional errors that result from this process are unintentional and may not be corrected upon review.

## 2022-05-28 NOTE — Progress Notes (Unsigned)
done

## 2022-05-28 NOTE — Patient Instructions (Signed)
It was good to see you again today!  Continue lansoprazole 30 mg twice daily before meals.  For repeat colonoscopy in 7 years.  Avoid aspirin containing products as much as possible  Try MiraLAX powder 1 capful in 8 ounces of water nightly as needed for constipation.  Follow-up office visit here in 1 year and as needed

## 2022-06-12 ENCOUNTER — Encounter (HOSPITAL_COMMUNITY): Payer: Self-pay | Admitting: Internal Medicine

## 2022-06-19 ENCOUNTER — Ambulatory Visit (INDEPENDENT_AMBULATORY_CARE_PROVIDER_SITE_OTHER): Payer: Medicare PPO | Admitting: Internal Medicine

## 2022-06-19 ENCOUNTER — Encounter: Payer: Self-pay | Admitting: Internal Medicine

## 2022-06-19 ENCOUNTER — Encounter (HOSPITAL_COMMUNITY): Payer: Self-pay | Admitting: Internal Medicine

## 2022-06-19 VITALS — BP 140/82 | HR 82 | Ht 67.0 in | Wt 135.0 lb

## 2022-06-19 DIAGNOSIS — R109 Unspecified abdominal pain: Secondary | ICD-10-CM | POA: Diagnosis not present

## 2022-06-19 DIAGNOSIS — R1030 Lower abdominal pain, unspecified: Secondary | ICD-10-CM | POA: Diagnosis not present

## 2022-06-19 DIAGNOSIS — R42 Dizziness and giddiness: Secondary | ICD-10-CM

## 2022-06-19 DIAGNOSIS — J449 Chronic obstructive pulmonary disease, unspecified: Secondary | ICD-10-CM | POA: Diagnosis not present

## 2022-06-19 DIAGNOSIS — D509 Iron deficiency anemia, unspecified: Secondary | ICD-10-CM | POA: Diagnosis not present

## 2022-06-19 DIAGNOSIS — E1159 Type 2 diabetes mellitus with other circulatory complications: Secondary | ICD-10-CM | POA: Diagnosis not present

## 2022-06-19 MED ORDER — MOUNJARO 2.5 MG/0.5ML ~~LOC~~ SOAJ: 2.5000 mg | SUBCUTANEOUS | 0 refills | Status: DC

## 2022-06-19 MED ORDER — DICYCLOMINE HCL 20 MG PO TABS: 20.0000 mg | ORAL_TABLET | Freq: Four times a day (QID) | ORAL | 0 refills | Status: DC | PRN

## 2022-06-19 MED ORDER — ALBUTEROL SULFATE (2.5 MG/3ML) 0.083% IN NEBU: 2.5000 mg | INHALATION_SOLUTION | Freq: Four times a day (QID) | RESPIRATORY_TRACT | 1 refills | Status: DC | PRN

## 2022-06-19 NOTE — Assessment & Plan Note (Signed)
Previously documented history of COPD.  Reports that PFTs were performed at [facility].  He requests a prescription for albuterol nebulizer solution as this seems to be more effective than albuterol inhaler.  Pulmonary exam today is unremarkable. -Albuterol nebulizer solution has been prescribed -Consider starting maintenance therapy at follow-up in 1 month

## 2022-06-19 NOTE — Patient Instructions (Signed)
It was a pleasure to see you today.  Thank you for giving [name] the opportunity to be involved in your care.  Below is a brief recap of your visit and next steps.  We will plan to see you again in 1 month.  Summary Try to fill [facility] with new insurance Try Bentyl for spasms Albuterol nebulizer solution prescribed Check hemoglobin given your symptoms

## 2022-06-19 NOTE — Progress Notes (Signed)
Established Patient Office Visit  Subjective   Patient ID: Jimmy Duran, male    DOB: [birth year]  Age: 67 y.o. MRN: [id number]  Chief Complaint  Patient presents with   Diabetes    Follow up   Jimmy Duran returns to care today for DM follow-up.  Last evaluated by me on 2/15 for diabetes follow-up through video encounter.  No medication changes were made at that time.  77-month follow-up was arranged.  In the interim he has been seen by gastroenterology for follow-up.  There have otherwise been no acute interval events. Jimmy Duran reports feeling fairly well today.  His acute concerns are requesting albuterol nebulizer solution and also to discuss suprapubic pain that has been intermittently present for the last few weeks.  He describes episodes of sharp, stabbing pain in the lower aspect of his abdomen lasting 5-10 minutes.  This seems to occur after meals.  He denies symptoms of nausea/vomiting and diarrhea or constipation.  He states that he was not able to fill the prescription for [facility] due to cost.  He reports today that he has new insurance and would like to try [facility] again.  He additionally endorses intermittent dizziness, which was present when he was last anemic as a result of iron deficiency.  He would like to have his hemoglobin checked and iron studies repeated today.  Past Medical History:  Diagnosis Date   AAA (abdominal aortic aneurysm)    Arthritis    Carotid artery disease    Cataract    Colon polyp    COPD (chronic obstructive pulmonary disease)    Coronary artery disease    Nonobstructive   Essential hypertension    GERD (gastroesophageal reflux disease)    Hemorrhoids    History of TIA (transient ischemic attack)    Low back pain    Lumbar radiculopathy    Mixed hyperlipidemia    Palpitations    Peripheral vascular disease    Shingles 09/23/2019   Stroke    Tobacco dependence    Type 2 diabetes mellitus    Past Surgical History:  Procedure Laterality  Date   APPENDECTOMY  1970   BIOPSY  12/08/2017   Procedure: BIOPSY;  Surgeon: Corbin Ade, MD;  [facility];  Service: Endoscopy;;  esophagus    BIOPSY  12/28/2020   Procedure: BIOPSY;  Surgeon: Corbin Ade, MD;  [facility];  Service: Endoscopy;;   BIOPSY  07/12/2021   Procedure: BIOPSY;  Surgeon: Corbin Ade, MD;  [facility];  Service: Endoscopy;;   BREAST SURGERY Right    Cyst resection on the right   CARDIAC CATHETERIZATION  1990's   in [place]. no stent placement   CATARACT EXTRACTION W/PHACO Right 06/18/2016   Procedure: CATARACT EXTRACTION PHACO AND INTRAOCULAR LENS PLACEMENT (IOC);  Surgeon: Galen Manila, MD;  Location: [facility] OR[facility];  Service: Ophthalmology;  Laterality: Right;  [name] 35.7 AP% 16.6 CDE 5.94 Fluid pack lot # [id number]   CATARACT EXTRACTION W/PHACO Left 07/16/2016   Procedure: CATARACT EXTRACTION PHACO AND INTRAOCULAR LENS PLACEMENT (IOC);  Surgeon: Galen Manila, MD;  Location: [facility];  Service: Ophthalmology;  Laterality: Left;  [name] 51.8 AP% 12.7 CDE 6.58 Fluid Pack Lot # [id number]   CHOLECYSTECTOMY  1993   COLONOSCOPY  02/12/2007   [id number] friable anal canal hemorrhoids, otherwise normal rectum and colon   COLONOSCOPY N/A 07/16/2012   RMR: Colonic diverticulosis. 4 mm tubular adenoma   COLONOSCOPY WITH PROPOFOL  N/A 12/08/2017   Dr. Jena Gauss: sigmoid and descending colon diverticulosis, transverse colon polyp (TUBULAR ADENOMA)   COLONOSCOPY WITH PROPOFOL N/A 03/06/2022   Procedure: COLONOSCOPY WITH PROPOFOL;  Surgeon: Corbin Ade, MD;  [facility];  Service: Endoscopy;  Laterality: N/A;  12:30 PM   ENDARTERECTOMY Right 10/08/2019   Procedure: RIGHT CAROTID ENDARTERECTOMY;  Surgeon: Larina Earthly, MD;  Location: [facility];  Service: Vascular;  Laterality: Right;   ESOPHAGOGASTRODUODENOSCOPY (EGD) WITH ESOPHAGEAL DILATION N/A 07/16/2012   RMR: +Candida esophagitis, Erosive reflux esophagiits.  Schatizi's ring status post dilation. Hiatal hernia. Antral erosions status post biopsy.    ESOPHAGOGASTRODUODENOSCOPY (EGD) WITH PROPOFOL N/A 12/08/2017   Dr. Jena Gauss: esophagitis, query EOE but negative for increased eosinophils on path, small hiatal hernia, normal stomach, normal duodenum, PAS stain with rare yeast forms on stain. Empiric dilation   ESOPHAGOGASTRODUODENOSCOPY (EGD) WITH PROPOFOL N/A 12/28/2020   esophagitis without bleeding, s/p biopsy and dilation. Antral erosions s/p biopsy.   ESOPHAGOGASTRODUODENOSCOPY (EGD) WITH PROPOFOL N/A 07/12/2021   Surgeon: Corbin Ade, MD;  Cobblestoning appearance of distal esophagus, slight narrowing of distal esophagus s/p dilation with 56 Fr and biopsy, gastric erosions biopsied, normal examined duodenum.  Esophageal biopsy with reactive squamous mucosa, gastric biopsy with reactive gastropathy, negative for H. pylori.   ESOPHAGOGASTRODUODENOSCOPY (EGD) WITH PROPOFOL N/A 03/06/2022   Procedure: ESOPHAGOGASTRODUODENOSCOPY (EGD) WITH PROPOFOL;  Surgeon: Corbin Ade, MD;  [facility];  Service: Endoscopy;  Laterality: N/A;   EYE SURGERY Bilateral 2018   cataract   GIVENS CAPSULE STUDY N/A 03/13/2022   Procedure: GIVENS CAPSULE STUDY;  Surgeon: Corbin Ade, MD;  [facility];  Service: Endoscopy;  Laterality: N/A;  7:30am   HERNIA REPAIR     JOINT REPLACEMENT     KNEE SURGERY     Left knee arthroscopy torn medial meniscus grade 4 chondral changes medial femoral condyle tibial plateau   MALONEY DILATION N/A 12/08/2017   Procedure: MALONEY DILATION;  Surgeon: Corbin Ade, MD;  [facility];  Service: Endoscopy;  Laterality: N/A;   MALONEY DILATION N/A 12/28/2020   Procedure: [name] DILATION;  Surgeon: Corbin Ade, MD;  [facility];  Service: Endoscopy;  Laterality: N/A;   MALONEY DILATION N/A 07/12/2021   Procedure: [name] DILATION;  Surgeon: Corbin Ade, MD;  [facility];  Service: Endoscopy;  Laterality: N/A;   PATCH ANGIOPLASTY Right 10/08/2019   Procedure: PATCH ANGIOPLASTY of the right common carotid artery using hemashield plaltinum finesse patch;  Surgeon: Larina Earthly, MD;  Location: [facility];  Service: Vascular;  Laterality: Right;   PERIPHERAL VASCULAR INTERVENTION  07/17/2020   Procedure: PERIPHERAL VASCULAR INTERVENTION;  Surgeon: Maeola Harman, MD;  Location: [facility] INVASIVE CV LAB;  Service: Cardiovascular;;   POLYPECTOMY  12/08/2017   Procedure: POLYPECTOMY;  Surgeon: Corbin Ade, MD;  [facility];  Service: Endoscopy;;  colon   POLYPECTOMY  03/06/2022   Procedure: POLYPECTOMY;  Surgeon: Corbin Ade, MD;  [facility];  Service: Endoscopy;;   TOTAL KNEE ARTHROPLASTY Left 04/27/2012   Procedure: TOTAL KNEE ARTHROPLASTY;  Surgeon: Vickki Hearing, MD;  Location: [facility];  Service: Orthopedics;  Laterality: Left;   TRANSCAROTID ARTERY REVASCULARIZATION  Left 10/12/2021   Procedure: Left Transcarotid Artery Revascularization;  Surgeon: Maeola Harman, MD;  Location: [facility] OR;  Service: Vascular;  Laterality: Left;   UMBILICAL HERNIA REPAIR  1993   VISCERAL ANGIOGRAPHY  N/A 07/17/2020   Procedure: mesenteric ANGIOGRAPHY;  Surgeon: Maeola Harman, MD;  Location: [facility] INVASIVE CV LAB;  Service: Cardiovascular;  Laterality: N/A;   Social History   Tobacco Use   Smoking status: Every Day    Packs/day: 0.50    Years: 30.00    Additional pack years: 0.00    Total pack years: 15.00    Types: Cigarettes   Smokeless tobacco: Former    Types: Chew    Quit date: 03/18/2018   Tobacco comments:    0.5 pk per day currently   Vaping Use   Vaping Use: Former  Substance Use Topics   Alcohol use: Not Currently    Comment: socially "beer and tomato juice"    Drug use: No   Family History  Problem Relation Age of Onset   Dementia Mother    Hypertension Mother    Colon polyps Mother     Osteoporosis Mother    Cancer Father    Hypertension Father    Coronary artery disease Father        CABG in his 39's   Heart attack Father    Cancer - Lung Father    Heart disease Brother        has a pacemaker   Emphysema Maternal Grandmother    Cancer Maternal Grandmother    Cancer - Lung Maternal Grandfather    Diabetes Maternal Grandfather    Cancer Paternal Grandmother    Heart attack Paternal Grandfather    Colon cancer Other        maternal great aunt   Colon cancer Other        paternal great aunt   No Known Allergies  Review of Systems  Gastrointestinal:  Positive for abdominal pain (Lower abdomen/suprapubic).  Neurological:  Positive for dizziness.  All other systems reviewed and are negative.    Objective:     BP (!) 140/82   Pulse 82   Ht 5\' 7"  (1.702 m)   Wt 135 lb (61.2 kg)   SpO2 95%   BMI 21.14 kg/m  BP Readings from Last 3 Encounters:  06/19/22 (!) 140/82  05/28/22 (!) 176/93  04/11/22 138/82   Physical Exam Vitals reviewed.  Constitutional:      General: He is not in acute distress.    Appearance: Normal appearance. He is not ill-appearing.  HENT:     Head: Normocephalic and atraumatic.     Right Ear: External ear normal.     Left Ear: External ear normal.     Nose: Nose normal. No congestion or rhinorrhea.     Mouth/Throat:     Mouth: Mucous membranes are moist.     Pharynx: Oropharynx is clear.  Eyes:     General: No scleral icterus.    Extraocular Movements: Extraocular movements intact.     Conjunctiva/sclera: Conjunctivae normal.     Pupils: Pupils are equal, round, and reactive to light.  Cardiovascular:     Rate and Rhythm: Normal rate and regular rhythm.     Pulses: Normal pulses.     Heart sounds: Murmur heard.  Pulmonary:     Effort: Pulmonary effort is normal.     Breath sounds: Normal breath sounds. No wheezing, rhonchi or rales.  Abdominal:     General: Abdomen is flat. Bowel sounds are normal. There is no  distension.     Palpations: Abdomen is soft.     Tenderness: There is no abdominal tenderness.  Musculoskeletal:  General: No swelling or deformity. Normal range of motion.     Cervical back: Normal range of motion.  Skin:    General: Skin is warm and dry.     Capillary Refill: Capillary refill takes less than 2 seconds.  Neurological:     General: No focal deficit present.     Mental Status: He is alert and oriented to person, place, and time.     Motor: No weakness.  Psychiatric:        Mood and Affect: Mood normal.        Behavior: Behavior normal.        Thought Content: Thought content normal.   Last CBC Lab Results  Component Value Date   WBC 8.1 03/07/2022   HGB 11.8 (L) 03/27/2022   HCT 38.7 (L) 03/27/2022   MCV 83 03/07/2022   MCH 25.4 (L) 03/07/2022   RDW 16.8 (H) 03/07/2022   PLT 371 03/07/2022   Last metabolic panel Lab Results  Component Value Date   GLUCOSE 229 (H) 03/07/2022   NA 138 03/07/2022   K 4.1 03/07/2022   CL 96 03/07/2022   CO2 21 03/07/2022   BUN 5 (L) 03/07/2022   CREATININE 0.81 03/07/2022   EGFR 98 03/07/2022   CALCIUM 9.2 03/07/2022   PROT 6.3 03/07/2022   ALBUMIN 4.1 03/07/2022   LABGLOB 2.2 03/07/2022   AGRATIO 1.9 03/07/2022   BILITOT <0.2 03/07/2022   ALKPHOS 96 03/07/2022   AST 20 03/07/2022   ALT 20 03/07/2022   ANIONGAP 12 02/25/2022   Last lipids Lab Results  Component Value Date   CHOL 92 02/25/2022   HDL 37 (L) 02/25/2022   LDLCALC 9 02/25/2022   TRIG 230 (H) 02/25/2022   CHOLHDL 2.5 02/25/2022   Last hemoglobin A1c Lab Results  Component Value Date   HGBA1C 9.1 (H) 04/11/2022   Last thyroid functions Lab Results  Component Value Date   TSH 1.790 10/18/2013     Assessment & Plan:   Problem List Items Addressed This Visit       DM type 2 causing vascular disease - Primary    Returning to care today for DM follow-up.  Last A1c 9.1 in February.  He is currently prescribed metformin 1000 mg twice  daily, Lantus 24 units nightly, and NovoLog 14 units 3 times daily with meals.  He was also prescribed Mounjaro but has been unable to fill the medication due to cost.  He reports today that his blood sugars are improving overall.  Recent readings have ranged 150-250.  He is only completing one NovoLog injection daily due to abdominal pain. -No medication changes today -New prescription for Greggory Keen has been sent since his insurance has changed -Follow-up in 1 month for repeat A1c.  In the interim I have requested that he keep a blood sugar log      COPD (chronic obstructive pulmonary disease)    Previously documented history of COPD.  Reports that PFTs were performed at [facility].  He requests a prescription for albuterol nebulizer solution as this seems to be more effective than albuterol inhaler.  Pulmonary exam today is unremarkable. -Albuterol nebulizer solution has been prescribed -Consider starting maintenance therapy at follow-up in 1 month      Iron deficiency anemia, unspecified    History of iron deficiency anemia.  He has been treated with iron infusions, which were completed in late January.  He endorses symptoms of dizziness today, which is similar to what he  experienced previously when he was iron deficient. -Repeat CBC and iron studies ordered today      Abdominal pain    He endorses sharp, stabbing intermittent abdominal pain over the last several weeks.  Episodes last 5 to 10 minutes and seem to occur after meals.  He denies further associated symptoms.  This seems most consistent with spasms. -Trial Bentyl for symptom relief      Return in about 1 month (around 07/19/2022) for DM.   [name], MD

## 2022-06-19 NOTE — Assessment & Plan Note (Signed)
He endorses sharp, stabbing intermittent abdominal pain over the last several weeks.  Episodes last 5 to 10 minutes and seem to occur after meals.  He denies further associated symptoms.  This seems most consistent with spasms. -Trial Bentyl for symptom relief

## 2022-06-19 NOTE — Assessment & Plan Note (Signed)
History of iron deficiency anemia.  He has been treated with iron infusions, which were completed in late January.  He endorses symptoms of dizziness today, which is similar to what he experienced previously when he was iron deficient. -Repeat CBC and iron studies ordered today

## 2022-06-19 NOTE — Assessment & Plan Note (Signed)
Returning to care today for DM follow-up.  Last A1c 9.1 in February.  He is currently prescribed metformin 1000 mg twice daily, Lantus 24 units nightly, and NovoLog 14 units 3 times daily with meals.  He was also prescribed Mounjaro but has been unable to fill the medication due to cost.  He reports today that his blood sugars are improving overall.  Recent readings have ranged 150-250.  He is only completing one NovoLog injection daily due to abdominal pain. -No medication changes today -New prescription for Greggory Keen has been sent since his insurance has changed -Follow-up in 1 month for repeat A1c.  In the interim I have requested that he keep a blood sugar log

## 2022-06-20 ENCOUNTER — Telehealth: Payer: Self-pay | Admitting: Internal Medicine

## 2022-06-20 LAB — CBC WITH DIFFERENTIAL/PLATELET
Basophils Absolute: 0.2 10*3/uL (ref 0.0–0.2)
Basos: 2 %
EOS (ABSOLUTE): 0.2 10*3/uL (ref 0.0–0.4)
Eos: 4 %
Hematocrit: 38.7 % (ref 37.5–51.0)
Hemoglobin: 12.9 g/dL — ABNORMAL LOW (ref 13.0–17.7)
Immature Grans (Abs): 0 10*3/uL (ref 0.0–0.1)
Immature Granulocytes: 0 %
Lymphocytes Absolute: 1.9 10*3/uL (ref 0.7–3.1)
Lymphs: 27 %
MCH: 27.4 pg (ref 26.6–33.0)
MCHC: 33.3 g/dL (ref 31.5–35.7)
MCV: 82 fL (ref 79–97)
Monocytes Absolute: 0.7 10*3/uL (ref 0.1–0.9)
Monocytes: 10 %
Neutrophils Absolute: 3.9 10*3/uL (ref 1.4–7.0)
Neutrophils: 57 %
Platelets: 367 10*3/uL (ref 150–450)
RBC: 4.71 x10E6/uL (ref 4.14–5.80)
RDW: 16.2 % — ABNORMAL HIGH (ref 11.6–15.4)
WBC: 6.9 10*3/uL (ref 3.4–10.8)

## 2022-06-20 LAB — IRON,TIBC AND FERRITIN PANEL
Ferritin: 25 ng/mL — ABNORMAL LOW (ref 30–400)
Iron Saturation: 16 % (ref 15–55)
Iron: 71 ug/dL (ref 38–169)
Total Iron Binding Capacity: 458 ug/dL — ABNORMAL HIGH (ref 250–450)
UIBC: 387 ug/dL — ABNORMAL HIGH (ref 111–343)

## 2022-06-20 NOTE — Telephone Encounter (Signed)
Patient will call back in either [facility] has it in stock, if they don't we will send to [facility] pharmacy.

## 2022-06-20 NOTE — Telephone Encounter (Signed)
Spouse called in on patient behalf. [facility]an is out of stock. Wants to try   [facility]  Wants a call back med sent

## 2022-06-21 ENCOUNTER — Other Ambulatory Visit: Payer: Self-pay

## 2022-06-21 ENCOUNTER — Telehealth: Payer: Self-pay | Admitting: Internal Medicine

## 2022-06-21 DIAGNOSIS — E1159 Type 2 diabetes mellitus with other circulatory complications: Secondary | ICD-10-CM

## 2022-06-21 MED ORDER — MOUNJARO 2.5 MG/0.5ML ~~LOC~~ SOAJ: 2.5000 mg | SUBCUTANEOUS | 0 refills | Status: DC

## 2022-06-21 NOTE — Telephone Encounter (Signed)
Patient spouse called and said the prescription  For the [facility] 2.5 MG/0.5ML Pen [[id number]]   Found this medicine at pharmacy: [facility]

## 2022-06-21 NOTE — Telephone Encounter (Signed)
sent 

## 2022-06-28 ENCOUNTER — Other Ambulatory Visit: Payer: Self-pay

## 2022-06-28 ENCOUNTER — Telehealth: Payer: Self-pay | Admitting: Pharmacy Technician

## 2022-06-28 NOTE — Telephone Encounter (Signed)
Auth Submission: NO AUTH NEEDED Site of care: Site of care: [facility] Payer: [employer] MEDICARE Medication & CPT/J Code(s) submitted: Ferrlecit (Ferric Gluconate) [id number] Route of submission (phone, fax, portal):  Phone # Fax # Auth type: Buy/Bill Units/visits requested: 3 Reference number:  Approval from: 06/28/22 to 10/28/22

## 2022-06-30 DIAGNOSIS — E1159 Type 2 diabetes mellitus with other circulatory complications: Secondary | ICD-10-CM | POA: Diagnosis not present

## 2022-07-05 ENCOUNTER — Encounter (HOSPITAL_COMMUNITY)
Admission: RE | Admit: 2022-07-05 | Discharge: 2022-07-05 | Disposition: A | Discharge status: Home / Self Care | Payer: Medicare PPO | Source: Ambulatory Visit | Attending: Internal Medicine | Admitting: Internal Medicine

## 2022-07-05 DIAGNOSIS — D509 Iron deficiency anemia, unspecified: Secondary | ICD-10-CM | POA: Insufficient documentation

## 2022-07-07 ENCOUNTER — Other Ambulatory Visit: Payer: Self-pay | Admitting: Nurse Practitioner

## 2022-07-07 ENCOUNTER — Other Ambulatory Visit: Payer: Self-pay | Admitting: Gastroenterology

## 2022-07-07 DIAGNOSIS — K219 Gastro-esophageal reflux disease without esophagitis: Secondary | ICD-10-CM

## 2022-07-08 ENCOUNTER — Encounter (HOSPITAL_COMMUNITY)
Admission: RE | Admit: 2022-07-08 | Discharge: 2022-07-08 | Disposition: A | Discharge status: Home / Self Care | Payer: Medicare PPO | Source: Ambulatory Visit | Attending: Internal Medicine | Admitting: Internal Medicine

## 2022-07-08 VITALS — BP 156/79 | HR 83 | Temp 97.8°F | Resp 18

## 2022-07-08 DIAGNOSIS — D509 Iron deficiency anemia, unspecified: Secondary | ICD-10-CM | POA: Diagnosis not present

## 2022-07-08 MED ORDER — SODIUM CHLORIDE 0.9 % IV SOLN
125.0000 mg | Freq: Once | INTRAVENOUS | Status: AC
  Administered 2022-07-08: 125 mg via INTRAVENOUS
  Filled 2022-07-08: qty 125

## 2022-07-08 MED ORDER — METHYLPREDNISOLONE SODIUM SUCC 40 MG IJ SOLR: 40.0000 mg | Freq: Once | INTRAMUSCULAR | Status: DC

## 2022-07-08 MED ORDER — DIPHENHYDRAMINE HCL 25 MG PO CAPS: 25.0000 mg | ORAL_CAPSULE | Freq: Once | ORAL | Status: DC

## 2022-07-08 MED ORDER — ACETAMINOPHEN 325 MG PO TABS: 650.0000 mg | ORAL_TABLET | Freq: Once | ORAL | Status: DC

## 2022-07-08 NOTE — Progress Notes (Signed)
Diagnosis: Iron Deficiency Anemia  Provider:  Christel Mormon MD  Procedure: Infusion  IV Type: Peripheral, IV Location: L Antecubital  Ferrlecit (ferric gluconate), Dose: 125 mg  Infusion Start Time: 1519  Infusion Stop Time: 1620  Post Infusion IV Care: Patient declined observation and Peripheral IV Discontinued  Discharge: Condition: Good, Destination: Home . AVS provided to patient.   Performed by:  Marin Shutter, RN

## 2022-07-08 NOTE — Addendum Note (Signed)
Encounter addended by: Kandra Nicolas, RN on: 07/08/2022 4:35 PM  Actions taken: Clinical Note Signed

## 2022-07-12 ENCOUNTER — Encounter (HOSPITAL_COMMUNITY): Payer: Medicare PPO

## 2022-07-15 ENCOUNTER — Encounter (HOSPITAL_COMMUNITY)
Admission: RE | Admit: 2022-07-15 | Discharge: 2022-07-15 | Disposition: A | Discharge status: Home / Self Care | Payer: Medicare PPO | Source: Ambulatory Visit | Attending: Internal Medicine

## 2022-07-15 VITALS — BP 162/88 | HR 84 | Temp 98.3°F | Resp 16

## 2022-07-15 DIAGNOSIS — D509 Iron deficiency anemia, unspecified: Secondary | ICD-10-CM | POA: Diagnosis not present

## 2022-07-15 MED ORDER — SODIUM CHLORIDE 0.9 % IV SOLN
125.0000 mg | Freq: Once | INTRAVENOUS | Status: AC
  Administered 2022-07-15: 125 mg via INTRAVENOUS
  Filled 2022-07-15: qty 10

## 2022-07-15 NOTE — Addendum Note (Signed)
Encounter addended by: ,  E, RN on: 07/15/2022 3:59 PM  Actions taken: Therapy plan modified

## 2022-07-15 NOTE — Progress Notes (Signed)
Diagnosis: Iron Deficiency Anemia  Provider:  Christel Mormon MD  Procedure: Infusion  IV Type: Peripheral, IV Location: L Antecubital  Ferrlecit (ferric gluconate), Dose: 125 mg  Infusion Start Time: 1428  Infusion Stop Time: 1527  Post Infusion IV Care: Patient declined observation and Peripheral IV Discontinued  Discharge: Condition: Good, Destination: Home . AVS declined.   Performed by:  Wyvonne Lenz, RN

## 2022-07-19 ENCOUNTER — Encounter (HOSPITAL_COMMUNITY): Payer: Medicare PPO

## 2022-07-23 ENCOUNTER — Encounter: Payer: Self-pay | Admitting: Internal Medicine

## 2022-07-23 ENCOUNTER — Ambulatory Visit (INDEPENDENT_AMBULATORY_CARE_PROVIDER_SITE_OTHER): Payer: Medicare PPO | Admitting: Internal Medicine

## 2022-07-23 ENCOUNTER — Encounter (HOSPITAL_COMMUNITY)
Admission: RE | Admit: 2022-07-23 | Discharge: 2022-07-23 | Disposition: A | Discharge status: Home / Self Care | Payer: Medicare PPO | Source: Ambulatory Visit | Attending: Internal Medicine | Admitting: Internal Medicine

## 2022-07-23 VITALS — BP 146/79 | HR 85 | Ht 67.0 in | Wt 133.8 lb

## 2022-07-23 VITALS — BP 153/75 | HR 76 | Temp 98.8°F | Resp 18

## 2022-07-23 DIAGNOSIS — D509 Iron deficiency anemia, unspecified: Secondary | ICD-10-CM

## 2022-07-23 DIAGNOSIS — E1159 Type 2 diabetes mellitus with other circulatory complications: Secondary | ICD-10-CM

## 2022-07-23 DIAGNOSIS — Z794 Long term (current) use of insulin: Secondary | ICD-10-CM

## 2022-07-23 DIAGNOSIS — I10 Essential (primary) hypertension: Secondary | ICD-10-CM | POA: Diagnosis not present

## 2022-07-23 DIAGNOSIS — R300 Dysuria: Secondary | ICD-10-CM

## 2022-07-23 LAB — POCT URINALYSIS DIP (CLINITEK)
Bilirubin, UA: NEGATIVE
Glucose, UA: 500 mg/dL — AB
Ketones, POC UA: NEGATIVE mg/dL
Leukocytes, UA: NEGATIVE
Nitrite, UA: NEGATIVE
POC PROTEIN,UA: 100 — AB
Spec Grav, UA: 1.02 (ref 1.010–1.025)
Urobilinogen, UA: 1 E.U./dL
pH, UA: 6.5 (ref 5.0–8.0)

## 2022-07-23 LAB — POCT GLYCOSYLATED HEMOGLOBIN (HGB A1C): Hemoglobin A1C: 8.9 % — AB (ref 4.0–5.6)

## 2022-07-23 MED ORDER — METHYLPREDNISOLONE SODIUM SUCC 40 MG IJ SOLR: 40.0000 mg | Freq: Once | INTRAMUSCULAR | Status: DC

## 2022-07-23 MED ORDER — LOSARTAN POTASSIUM 25 MG PO TABS
12.5000 mg | ORAL_TABLET | Freq: Every day | ORAL | 2 refills | Status: DC
Start: 2022-07-23 — End: 2022-08-20

## 2022-07-23 MED ORDER — TIRZEPATIDE 5 MG/0.5ML ~~LOC~~ SOPN
5.0000 mg | PEN_INJECTOR | SUBCUTANEOUS | 0 refills | Status: AC
Start: 2022-07-23 — End: 2022-08-20

## 2022-07-23 MED ORDER — ACETAMINOPHEN 325 MG PO TABS: 650.0000 mg | ORAL_TABLET | Freq: Once | ORAL | Status: DC

## 2022-07-23 MED ORDER — SODIUM CHLORIDE 0.9 % IV SOLN
125.0000 mg | Freq: Once | INTRAVENOUS | Status: AC
  Administered 2022-07-23: 125 mg via INTRAVENOUS
  Filled 2022-07-23: qty 125

## 2022-07-23 MED ORDER — DIPHENHYDRAMINE HCL 25 MG PO CAPS: 25.0000 mg | ORAL_CAPSULE | Freq: Once | ORAL | Status: DC

## 2022-07-23 NOTE — Patient Instructions (Signed)
It was a pleasure to see you today.  Thank you for giving [name] the opportunity to be involved in your care.  Below is a brief recap of your visit and next steps.  We will plan to see you again in 4 weeks.  Summary Increase Mounjaro to 5 mg weekly Start losartan 12.5 mg daily Repeat iron studies in one week Follow up in 4 weeks for BP check through video encounter

## 2022-07-23 NOTE — Progress Notes (Unsigned)
Established Patient Office Visit  Subjective   Patient ID: Jimmy Duran, male    DOB: [birth year]  Age: 67 y.o. MRN: [id number]  Chief Complaint  Patient presents with   Diabetes    Follow up   Jimmy Duran returns to care today for follow-up.  Last evaluated by me on 4/17 for DM follow-up.  At that time he endorsed abdominal cramping.  Bentyl was prescribed for symptom relief.  In the interim he has completed 2 iron infusions.  There have otherwise been no acute interval events.  Jimmy Duran reports feeling fairly well today.  Abdominal cramping has resolved.  He endorses a 3-4-day history of dysuria.  He has not appreciated any changes to the quality of his urine, specifically denying hematuria.  There is also been no foul odor appreciated.  He requests to have his urine checked for UTI.  No prior history of UTI.  He has not experienced any adverse side effects since starting Mounjaro.  Past Medical History:  Diagnosis Date   AAA (abdominal aortic aneurysm) (HCC)    Arthritis    Carotid artery disease (HCC)    Cataract    Colon polyp    COPD (chronic obstructive pulmonary disease) (HCC)    Coronary artery disease    Nonobstructive   Essential hypertension    GERD (gastroesophageal reflux disease)    Hemorrhoids    History of TIA (transient ischemic attack)    Low back pain    Lumbar radiculopathy    Mixed hyperlipidemia    Palpitations    Peripheral vascular disease (HCC)    Shingles 09/23/2019   Stroke (HCC)    Tobacco dependence    Type 2 diabetes mellitus (HCC)    Past Surgical History:  Procedure Laterality Date   APPENDECTOMY  1970   BIOPSY  12/08/2017   Procedure: BIOPSY;  Surgeon: Corbin Ade, MD;  [facility]: [facility];  Service: Endoscopy;;  esophagus    BIOPSY  12/28/2020   Procedure: BIOPSY;  Surgeon: Corbin Ade, MD;  [facility]: [facility];  Service: Endoscopy;;   BIOPSY  07/12/2021   Procedure: BIOPSY;  Surgeon: Corbin Ade, MD;  [facility];  Service: Endoscopy;;   BREAST SURGERY Right    Cyst resection on the right   CARDIAC CATHETERIZATION  1990's   in [place]. no stent placement   CATARACT EXTRACTION W/PHACO Right 06/18/2016   Procedure: CATARACT EXTRACTION PHACO AND INTRAOCULAR LENS PLACEMENT (IOC);  Surgeon: Galen Manila, MD;  Location: [facility];  Service: Ophthalmology;  Laterality: Right;  [name] 35.7 AP% 16.6 CDE 5.94 Fluid pack lot # [id number]   CATARACT EXTRACTION W/PHACO Left 07/16/2016   Procedure: CATARACT EXTRACTION PHACO AND INTRAOCULAR LENS PLACEMENT (IOC);  Surgeon: Galen Manila, MD;  Location: [facility];  Service: Ophthalmology;  Laterality: Left;  [name] 51.8 AP% 12.7 CDE 6.58 Fluid Pack Lot # [id number]   CHOLECYSTECTOMY  1993   COLONOSCOPY  02/12/2007   XLK:[id number] friable anal canal hemorrhoids, otherwise normal rectum and colon   COLONOSCOPY N/A 07/16/2012   RMR: Colonic diverticulosis. 4 mm tubular adenoma   COLONOSCOPY WITH PROPOFOL N/A 12/08/2017   Dr. Jena Gauss: sigmoid and descending colon diverticulosis, transverse colon polyp (TUBULAR ADENOMA)   COLONOSCOPY WITH PROPOFOL N/A 03/06/2022   Procedure: COLONOSCOPY WITH PROPOFOL;  Surgeon: Corbin Ade, MD;  [facility];  Service: Endoscopy;  Laterality: N/A;  12:30 PM   ENDARTERECTOMY Right 10/08/2019   Procedure: RIGHT CAROTID  ENDARTERECTOMY;  Surgeon: Larina Earthly, MD;  Location: [facility] OR;  Service: Vascular;  Laterality: Right;   ESOPHAGOGASTRODUODENOSCOPY (EGD) WITH ESOPHAGEAL DILATION N/A 07/16/2012   RMR: +Candida esophagitis, Erosive reflux esophagiits. Schatizi's ring status post dilation. Hiatal hernia. Antral erosions status post biopsy.    ESOPHAGOGASTRODUODENOSCOPY (EGD) WITH PROPOFOL N/A 12/08/2017   Dr. Jena Gauss: esophagitis, query EOE but negative for increased eosinophils on path, small hiatal hernia, normal stomach, normal duodenum, PAS stain with rare yeast forms on stain. Empiric dilation    ESOPHAGOGASTRODUODENOSCOPY (EGD) WITH PROPOFOL N/A 12/28/2020   esophagitis without bleeding, s/p biopsy and dilation. Antral erosions s/p biopsy.   ESOPHAGOGASTRODUODENOSCOPY (EGD) WITH PROPOFOL N/A 07/12/2021   Surgeon: Corbin Ade, MD;  Cobblestoning appearance of distal esophagus, slight narrowing of distal esophagus s/p dilation with 56 Fr and biopsy, gastric erosions biopsied, normal examined duodenum.  Esophageal biopsy with reactive squamous mucosa, gastric biopsy with reactive gastropathy, negative for H. pylori.   ESOPHAGOGASTRODUODENOSCOPY (EGD) WITH PROPOFOL N/A 03/06/2022   Procedure: ESOPHAGOGASTRODUODENOSCOPY (EGD) WITH PROPOFOL;  Surgeon: Corbin Ade, MD;  [facility];  Service: Endoscopy;  Laterality: N/A;   EYE SURGERY Bilateral 2018   cataract   GIVENS CAPSULE STUDY N/A 03/13/2022   Procedure: GIVENS CAPSULE STUDY;  Surgeon: Corbin Ade, MD;  [facility];  Service: Endoscopy;  Laterality: N/A;  7:30am   HERNIA REPAIR     JOINT REPLACEMENT     KNEE SURGERY     Left knee arthroscopy torn medial meniscus grade 4 chondral changes medial femoral condyle tibial plateau   MALONEY DILATION N/A 12/08/2017   Procedure: MALONEY DILATION;  Surgeon: Corbin Ade, MD;  [facility];  Service: Endoscopy;  Laterality: N/A;   MALONEY DILATION N/A 12/28/2020   Procedure: [name] DILATION;  Surgeon: Corbin Ade, MD;  [facility];  Service: Endoscopy;  Laterality: N/A;   MALONEY DILATION N/A 07/12/2021   Procedure: [name] DILATION;  Surgeon: Corbin Ade, MD;  [facility];  Service: Endoscopy;  Laterality: N/A;   PATCH ANGIOPLASTY Right 10/08/2019   Procedure: PATCH ANGIOPLASTY of the right common carotid artery using hemashield plaltinum finesse patch;  Surgeon: Larina Earthly, MD;  Location: [facility];  Service: Vascular;  Laterality: Right;   PERIPHERAL VASCULAR INTERVENTION  07/17/2020   Procedure: PERIPHERAL  VASCULAR INTERVENTION;  Surgeon: Maeola Harman, MD;  Location: [facility] INVASIVE CV LAB;  Service: Cardiovascular;;   POLYPECTOMY  12/08/2017   Procedure: POLYPECTOMY;  Surgeon: Corbin Ade, MD;  [facility];  Service: Endoscopy;;  colon   POLYPECTOMY  03/06/2022   Procedure: POLYPECTOMY;  Surgeon: Corbin Ade, MD;  [facility];  Service: Endoscopy;;   TOTAL KNEE ARTHROPLASTY Left 04/27/2012   Procedure: TOTAL KNEE ARTHROPLASTY;  Surgeon: Vickki Hearing, MD;  Location: [facility];  Service: Orthopedics;  Laterality: Left;   TRANSCAROTID ARTERY REVASCULARIZATION  Left 10/12/2021   Procedure: Left Transcarotid Artery Revascularization;  Surgeon: Maeola Harman, MD;  Location: [facility] OR;  Service: Vascular;  Laterality: Left;   UMBILICAL HERNIA REPAIR  1993   VISCERAL ANGIOGRAPHY N/A 07/17/2020   Procedure: mesenteric ANGIOGRAPHY;  Surgeon: Maeola Harman, MD;  Location: [facility] INVASIVE CV LAB;  Service: Cardiovascular;  Laterality: N/A;   Social History   Tobacco Use   Smoking status: Every Day    Packs/day: 0.50    Years: 30.00    Additional pack years: 0.00  Total pack years: 15.00    Types: Cigarettes   Smokeless tobacco: Former    Types: Chew    Quit date: 03/18/2018   Tobacco comments:    0.5 pk per day currently   Vaping Use   Vaping Use: Former  Substance Use Topics   Alcohol use: Not Currently    Comment: socially "beer and tomato juice"    Drug use: No   Family History  Problem Relation Age of Onset   Dementia Mother    Hypertension Mother    Colon polyps Mother    Osteoporosis Mother    Cancer Father    Hypertension Father    Coronary artery disease Father        CABG in his 46's   Heart attack Father    Cancer - Lung Father    Heart disease Brother        has a pacemaker   Emphysema Maternal Grandmother    Cancer Maternal Grandmother    Cancer - Lung Maternal Grandfather    Diabetes Maternal Grandfather     Cancer Paternal Grandmother    Heart attack Paternal Grandfather    Colon cancer Other        maternal great aunt   Colon cancer Other        paternal great aunt   No Known Allergies  Review of Systems  Genitourinary:  Positive for dysuria.  All other systems reviewed and are negative.    Objective:     BP (!) 146/79   Pulse 85   Ht 5\' 7"  (1.702 m)   Wt 133 lb 12.8 oz (60.7 kg)   SpO2 94%   BMI 20.96 kg/m  BP Readings from Last 3 Encounters:  07/23/22 (!) 153/75  07/23/22 (!) 146/79  07/15/22 (!) 162/88   Physical Exam Vitals reviewed.  Constitutional:      General: He is not in acute distress.    Appearance: Normal appearance. He is not ill-appearing.  HENT:     Head: Normocephalic and atraumatic.     Right Ear: External ear normal.     Left Ear: External ear normal.     Nose: Nose normal. No congestion or rhinorrhea.     Mouth/Throat:     Mouth: Mucous membranes are moist.     Pharynx: Oropharynx is clear.  Eyes:     General: No scleral icterus.    Extraocular Movements: Extraocular movements intact.     Conjunctiva/sclera: Conjunctivae normal.     Pupils: Pupils are equal, round, and reactive to light.  Cardiovascular:     Rate and Rhythm: Normal rate and regular rhythm.     Pulses: Normal pulses.     Heart sounds: Murmur heard.  Pulmonary:     Effort: Pulmonary effort is normal.     Breath sounds: Normal breath sounds. No wheezing, rhonchi or rales.  Abdominal:     General: Abdomen is flat. Bowel sounds are normal. There is no distension.     Palpations: Abdomen is soft.     Tenderness: There is no abdominal tenderness.  Musculoskeletal:        General: No swelling or deformity. Normal range of motion.     Cervical back: Normal range of motion.  Skin:    General: Skin is warm and dry.     Capillary Refill: Capillary refill takes less than 2 seconds.  Neurological:     General: No focal deficit present.     Mental Status: He is alert  and  oriented to person, place, and time.     Motor: No weakness.  Psychiatric:        Mood and Affect: Mood normal.        Behavior: Behavior normal.        Thought Content: Thought content normal.   Last CBC Lab Results  Component Value Date   WBC 6.9 06/19/2022   HGB 12.9 (L) 06/19/2022   HCT 38.7 06/19/2022   MCV 82 06/19/2022   MCH 27.4 06/19/2022   RDW 16.2 (H) 06/19/2022   PLT 367 06/19/2022   Last metabolic panel Lab Results  Component Value Date   GLUCOSE 229 (H) 03/07/2022   NA 138 03/07/2022   K 4.1 03/07/2022   CL 96 03/07/2022   CO2 21 03/07/2022   BUN 5 (L) 03/07/2022   CREATININE 0.81 03/07/2022   EGFR 98 03/07/2022   CALCIUM 9.2 03/07/2022   PROT 6.3 03/07/2022   ALBUMIN 4.1 03/07/2022   LABGLOB 2.2 03/07/2022   AGRATIO 1.9 03/07/2022   BILITOT <0.2 03/07/2022   ALKPHOS 96 03/07/2022   AST 20 03/07/2022   ALT 20 03/07/2022   ANIONGAP 12 02/25/2022   Last lipids Lab Results  Component Value Date   CHOL 92 02/25/2022   HDL 37 (L) 02/25/2022   LDLCALC 9 02/25/2022   TRIG 230 (H) 02/25/2022   CHOLHDL 2.5 02/25/2022   Last hemoglobin A1c Lab Results  Component Value Date   HGBA1C 8.9 (A) 07/23/2022   Last thyroid functions Lab Results  Component Value Date   TSH 1.790 10/18/2013     Assessment & Plan:   Problem List Items Addressed This Visit       Essential hypertension, benign - Primary    Previously documented history of essential hypertension.  Not currently on any antihypertensive therapy due to symptomatic hypotension previously.  BP is elevated today and has been elevated at multiple appointments this month. -Start losartan 12.5 mg daily -Follow-up in 4 weeks for BP check      DM type 2 causing vascular disease (HCC)    POC A1c today is 8.9.  He checks his blood sugar multiple times daily at home and reports readings ranging high 100s-low 200s.  His current medication regimen consists of Mounjaro 2.5 mg weekly, metformin 1000 mg  twice daily, Lantus 24 units nightly, and NovoLog 14 units 3 times daily with meals. -Increase Mounjaro to 5 mg weekly, further uptitrate dose at 4-week intervals -Continue additional medications and insulin at their current doses       Iron deficiency anemia, unspecified    He will complete his last iron infusion today (5/21). -Repeat iron studies in 1 week      Dysuria    His acute concern today is a 3-4-day history of dysuria.  Abdominal exam is unremarkable.  No prior history of UTI.  He has not appreciated any changes to the quality of his urine.  POC UA today is not consistent with infection. -He was instructed return to care if symptoms worsen or fail to improve      Return in about 4 weeks (around 08/20/2022) for HTN.   [name], MD

## 2022-07-23 NOTE — Addendum Note (Signed)
Encounter addended by: ,  E, RN on: 07/23/2022 3:21 PM  Actions taken: Therapy plan modified

## 2022-07-23 NOTE — Progress Notes (Signed)
Diagnosis: Iron Deficiency Anemia  Provider:  Christel Mormon MD  Procedure: IV Infusion  IV Type: Peripheral, IV Location: R Forearm  Ferrlecit (ferric gluconate), Dose: 125 mg  Infusion Start Time: 1147  Infusion Stop Time: 1253  Post Infusion IV Care: Peripheral IV Discontinued  Discharge: Condition: Good, Destination: Home . AVS Declined  Performed by:  Marlow Baars [place], RN

## 2022-07-24 ENCOUNTER — Other Ambulatory Visit: Payer: Self-pay | Admitting: Internal Medicine

## 2022-07-24 DIAGNOSIS — R109 Unspecified abdominal pain: Secondary | ICD-10-CM

## 2022-07-25 DIAGNOSIS — R300 Dysuria: Secondary | ICD-10-CM | POA: Insufficient documentation

## 2022-07-25 NOTE — Assessment & Plan Note (Signed)
POC A1c today is 8.9.  He checks his blood sugar multiple times daily at home and reports readings ranging high 100s-low 200s.  His current medication regimen consists of Mounjaro 2.5 mg weekly, metformin 1000 mg twice daily, Lantus 24 units nightly, and NovoLog 14 units 3 times daily with meals. -Increase Mounjaro to 5 mg weekly, further uptitrate dose at 4-week intervals -Continue additional medications and insulin at their current doses

## 2022-07-25 NOTE — Assessment & Plan Note (Signed)
Previously documented history of essential hypertension.  Not currently on any antihypertensive therapy due to symptomatic hypotension previously.  BP is elevated today and has been elevated at multiple appointments this month. -Start losartan 12.5 mg daily -Follow-up in 4 weeks for BP check

## 2022-07-25 NOTE — Assessment & Plan Note (Signed)
He will complete his last iron infusion today (5/21). -Repeat iron studies in 1 week

## 2022-07-25 NOTE — Assessment & Plan Note (Signed)
His acute concern today is a 3-4-day history of dysuria.  Abdominal exam is unremarkable.  No prior history of UTI.  He has not appreciated any changes to the quality of his urine.  POC UA today is not consistent with infection. -He was instructed return to care if symptoms worsen or fail to improve

## 2022-08-02 DIAGNOSIS — D509 Iron deficiency anemia, unspecified: Secondary | ICD-10-CM | POA: Diagnosis not present

## 2022-08-03 LAB — CBC WITH DIFFERENTIAL/PLATELET
Basophils Absolute: 0.1 10*3/uL (ref 0.0–0.2)
Basos: 2 %
EOS (ABSOLUTE): 0.2 10*3/uL (ref 0.0–0.4)
Eos: 2 %
Hematocrit: 43.7 % (ref 37.5–51.0)
Hemoglobin: 14.2 g/dL (ref 13.0–17.7)
Immature Grans (Abs): 0 10*3/uL (ref 0.0–0.1)
Immature Granulocytes: 0 %
Lymphocytes Absolute: 1.1 10*3/uL (ref 0.7–3.1)
Lymphs: 12 %
MCH: 29.2 pg (ref 26.6–33.0)
MCHC: 32.5 g/dL (ref 31.5–35.7)
MCV: 90 fL (ref 79–97)
Monocytes Absolute: 0.7 10*3/uL (ref 0.1–0.9)
Monocytes: 7 %
Neutrophils Absolute: 7.1 10*3/uL — ABNORMAL HIGH (ref 1.4–7.0)
Neutrophils: 77 %
Platelets: 326 10*3/uL (ref 150–450)
RBC: 4.86 x10E6/uL (ref 4.14–5.80)
RDW: 15.8 % — ABNORMAL HIGH (ref 11.6–15.4)
WBC: 9.2 10*3/uL (ref 3.4–10.8)

## 2022-08-03 LAB — IRON,TIBC AND FERRITIN PANEL
Ferritin: 128 ng/mL (ref 30–400)
Iron Saturation: 16 % (ref 15–55)
Iron: 68 ug/dL (ref 38–169)
Total Iron Binding Capacity: 423 ug/dL (ref 250–450)
UIBC: 355 ug/dL — ABNORMAL HIGH (ref 111–343)

## 2022-08-05 ENCOUNTER — Encounter: Payer: Self-pay | Admitting: Internal Medicine

## 2022-08-08 ENCOUNTER — Encounter: Payer: Self-pay | Admitting: Internal Medicine

## 2022-08-08 DIAGNOSIS — R059 Cough, unspecified: Secondary | ICD-10-CM

## 2022-08-08 MED ORDER — BENZONATATE 100 MG PO CAPS
200.0000 mg | ORAL_CAPSULE | Freq: Three times a day (TID) | ORAL | 1 refills | Status: DC | PRN
Start: 2022-08-08 — End: 2022-10-13

## 2022-08-10 ENCOUNTER — Other Ambulatory Visit: Payer: Self-pay | Admitting: Internal Medicine

## 2022-08-10 DIAGNOSIS — R109 Unspecified abdominal pain: Secondary | ICD-10-CM

## 2022-08-20 ENCOUNTER — Other Ambulatory Visit: Payer: Self-pay

## 2022-08-20 ENCOUNTER — Telehealth (INDEPENDENT_AMBULATORY_CARE_PROVIDER_SITE_OTHER): Payer: Medicare PPO | Admitting: Internal Medicine

## 2022-08-20 ENCOUNTER — Encounter: Payer: Self-pay | Admitting: Internal Medicine

## 2022-08-20 DIAGNOSIS — R079 Chest pain, unspecified: Secondary | ICD-10-CM

## 2022-08-20 DIAGNOSIS — E1159 Type 2 diabetes mellitus with other circulatory complications: Secondary | ICD-10-CM | POA: Diagnosis not present

## 2022-08-20 DIAGNOSIS — I10 Essential (primary) hypertension: Secondary | ICD-10-CM | POA: Diagnosis not present

## 2022-08-20 DIAGNOSIS — Z7985 Long-term (current) use of injectable non-insulin antidiabetic drugs: Secondary | ICD-10-CM

## 2022-08-20 DIAGNOSIS — J189 Pneumonia, unspecified organism: Secondary | ICD-10-CM

## 2022-08-20 MED ORDER — AZITHROMYCIN 250 MG PO TABS
ORAL_TABLET | ORAL | 0 refills | Status: DC
Start: 2022-08-20 — End: 2022-10-13

## 2022-08-20 MED ORDER — OZEMPIC (0.25 OR 0.5 MG/DOSE) 2 MG/3ML ~~LOC~~ SOPN: 0.2500 mg | PEN_INJECTOR | SUBCUTANEOUS | 0 refills | Status: DC

## 2022-08-20 MED ORDER — LOSARTAN POTASSIUM 25 MG PO TABS: 25.0000 mg | ORAL_TABLET | Freq: Every day | ORAL | 2 refills | Status: DC

## 2022-08-20 NOTE — Assessment & Plan Note (Signed)
Evaluated today for HTN follow-up.  Losartan 12.5 mg daily was started at his last appointment.  His blood pressure remains significantly elevated today. -Goal BP is < 130/80.  He was instructed to increase losartan to 25 mg daily for the next 3 days.  If BP remains above goal, he will further increase to 50 mg daily.  We will plan for follow-up in 4 weeks for HTN check.

## 2022-08-20 NOTE — Progress Notes (Signed)
Virtual Visit via Video Note  I connected with Jimmy Duran on 08/20/22 at 10:40 AM EDT by a video enabled telemedicine application and verified that I am speaking with the correct person using two identifiers.  Patient Location: Home Provider Location: Office/Clinic  I discussed the limitations, risks, security, and privacy concerns of performing an evaluation and management service by video and the availability of in person appointments. I also discussed with the patient that there may be a patient responsible charge related to this service. The patient expressed understanding and agreed to proceed.  Subjective: PCP: [name], MD  Chief Complaint  Patient presents with   Hypertension   Diabetes   Jimmy Duran has been evaluated through video encounter for follow-up of DM and HTN.  He was last evaluated by me on 5/21.  Losartan 12.5 mg daily was started for treatment of hypertension at that time.  Jimmy Duran was also increased to 5 mg weekly for improved treatment of diabetes.  4-week follow-up was arranged for reassessment.  In the interim, Jimmy Duran has requested medication for cough relief.  There have otherwise been no acute interval events.  Today Jimmy Duran continues to endorse a cough that is now productive of green sputum.  He denies fever/chills and shortness of breath, and is interested in additional treatment options.  He has been taking losartan 12.5 mg daily as prescribed and reports that his blood pressure readings remain elevated.  He reports systolic readings in the 170s most recently.  He has not been able to start Mounjaro at 5 mg weekly due to cost.  But continues to take metformin and insulin as prescribed.  Lastly, Jimmy Duran endorses right-sided chest discomfort.  Episodes seem to be triggered by coughing spells.  They do not occur at rest or with an activity.  ROS: Per HPI  Current Outpatient Medications:    azithromycin (ZITHROMAX Z-PAK) 250 MG tablet, Take  2 tablets (500 mg) PO today, then 1 tablet (250 mg) PO daily x4 days., Disp: 6 tablet, Rfl: 0   albuterol (PROVENTIL) (2.5 MG/3ML) 0.083% nebulizer solution, Take 3 mLs (2.5 mg total) by nebulization every 6 (six) hours as needed for wheezing or shortness of breath., Disp: 150 mL, Rfl: 1   albuterol (VENTOLIN HFA) 108 (90 Base) MCG/ACT inhaler, Inhale 2 puffs into the lungs every 6 (six) hours as needed for wheezing or shortness of breath., Disp: 8 g, Rfl: 2   aspirin EC 81 MG tablet, Take 1 tablet (81 mg total) by mouth daily at 6 (six) AM. Swallow whole., Disp: 30 tablet, Rfl: 12   benzonatate (TESSALON PERLES) 100 MG capsule, Take 2 capsules (200 mg total) by mouth 3 (three) times daily as needed for cough., Disp: 30 capsule, Rfl: 1   Continuous Blood Gluc Sensor (FREESTYLE LIBRE 3 SENSOR) MISC, 1 each by Does not apply route every 14 (fourteen) days. Place 1 sensor on the skin every 14 days. Use to check glucose continuously, Disp: 2 each, Rfl: 2   dicyclomine (BENTYL) 20 MG tablet, TAKE 1 TABLET BY MOUTH 4 TIMES DAILY AS NEEDED FOR SPASMS, Disp: 30 tablet, Rfl: 0   diphenhydrAMINE (BENADRYL) 25 MG tablet, Take 25 mg by mouth daily as needed for allergies. , Disp: , Rfl:    famotidine (PEPCID) 20 MG tablet, Take 1 tablet (20 mg total) by mouth 2 (two) times daily., Disp: 60 tablet, Rfl: 3   fenofibrate (TRICOR) 145 MG tablet, Take 1 tablet (145 mg total) by  mouth daily., Disp: 90 tablet, Rfl: 3   Glucagon (GVOKE HYPOPEN 2-PACK) 1 MG/0.2ML SOAJ, Inject 1 mg into the skin as needed (hypoglycemia)., Disp: 0.2 mL, Rfl: 1   insulin glargine (LANTUS) 100 UNIT/ML injection, Inject 0.24 mLs (24 Units total) into the skin daily., Disp: 10 mL, Rfl: PRN   lansoprazole (PREVACID) 30 MG capsule, TAKE 1 CAPSULE BY MOUTH TWICE DAILY BEFORE A MEAL, Disp: 60 capsule, Rfl: 11   losartan (COZAAR) 25 MG tablet, Take 1 tablet (25 mg total) by mouth daily., Disp: 30 tablet, Rfl: 2   metFORMIN (GLUCOPHAGE) 1000 MG  tablet, TAKE 1 TABLET BY MOUTH TWICE DAILY WITH A MEAL, Disp: 60 tablet, Rfl: 0   metoprolol tartrate (LOPRESSOR) 25 MG tablet, Take 1 tablet (25 mg total) by mouth 2 (two) times daily., Disp: 60 tablet, Rfl: 6   nitroGLYCERIN (NITROSTAT) 0.4 MG SL tablet, Place 1 tablet (0.4 mg total) under the tongue every 5 (five) minutes as needed for chest pain (Do not excieed more than 3 tablets in 15 minutes.)., Disp: 25 tablet, Rfl: 3   NOVOLOG 100 UNIT/ML injection, Inject 14 Units into the skin 3 (three) times daily with meals., Disp: 10 mL, Rfl: 11   rosuvastatin (CRESTOR) 40 MG tablet, Take 1 tablet (40 mg total) by mouth daily., Disp: 90 tablet, Rfl: 3   tirzepatide (MOUNJARO) 5 MG/0.5ML Pen, Inject 5 mg into the skin once a week for 28 days., Disp: 2 mL, Rfl: 0  Assessment and Plan:  Community acquired pneumonia, unspecified laterality Assessment & Plan: Z-Pak prescribed today as he has developed a cough with green sputum production.  Essential hypertension, benign Assessment & Plan: Evaluated today for HTN follow-up.  Losartan 12.5 mg daily was started at his last appointment.  His blood pressure remains significantly elevated today. -Goal BP is < 130/80.  He was instructed to increase losartan to 25 mg daily for the next 3 days.  If BP remains above goal, he will further increase to 50 mg daily.  We will plan for follow-up in 4 weeks for HTN check.  DM type 2 causing vascular disease (HCC) Assessment & Plan: Jimmy Duran was increased to 5 mg weekly at his last appointment, however he has not been able to start this medication due to cost.  He continues to take metformin, Lantus, and NovoLog as prescribed.  Last A1c was 8.9. -Will attempt to start Ozempic. -Follow-up in 4 weeks for reassessment  Right-sided chest pain Assessment & Plan: He endorses recent development of right-sided chest discomfort.  This occurs intermittently and seems to be triggered by coughing spells.  Of note his past  medical history is significant for CAD.  He has multiple risk factors that are not adequately controlled currently. -I have recommended that Jimmy Duran present to the emergency department if right-sided chest pain persists or occurs at rest.  He has expressed understanding and is in agreement with this plan.  Follow Up Instructions: Return in about 4 weeks (around 09/17/2022) for HTN, DM.   I discussed the assessment and treatment plan with the patient. The patient was provided an opportunity to ask questions, and all were answered. The patient agreed with the plan and demonstrated an understanding of the instructions.   The patient was advised to call back or seek an in-person evaluation if the symptoms worsen or if the condition fails to improve as anticipated.  The above assessment and management plan was discussed with the patient. The patient verbalized understanding of and has  agreed to the management plan.   [name], MD

## 2022-08-20 NOTE — Assessment & Plan Note (Signed)
Z-Pak prescribed today as he has developed a cough with green sputum production.

## 2022-08-20 NOTE — Assessment & Plan Note (Signed)
Mounjaro was increased to 5 mg weekly at his last appointment, however he has not been able to start this medication due to cost.  He continues to take metformin, Lantus, and NovoLog as prescribed.  Last A1c was 8.9. -Will attempt to start Ozempic. -Follow-up in 4 weeks for reassessment

## 2022-08-20 NOTE — Assessment & Plan Note (Signed)
He endorses recent development of right-sided chest discomfort.  This occurs intermittently and seems to be triggered by coughing spells.  Of note his past medical history is significant for CAD.  He has multiple risk factors that are not adequately controlled currently. -I have recommended that Jimmy Duran present to the emergency department if right-sided chest pain persists or occurs at rest.  He has expressed understanding and is in agreement with this plan.

## 2022-08-20 NOTE — Telephone Encounter (Signed)
Spoke with patient.

## 2022-09-03 ENCOUNTER — Other Ambulatory Visit: Payer: Self-pay | Admitting: Internal Medicine

## 2022-09-15 ENCOUNTER — Other Ambulatory Visit: Payer: Self-pay | Admitting: Internal Medicine

## 2022-09-15 ENCOUNTER — Other Ambulatory Visit: Payer: Self-pay | Admitting: Nurse Practitioner

## 2022-09-15 DIAGNOSIS — R109 Unspecified abdominal pain: Secondary | ICD-10-CM

## 2022-09-17 ENCOUNTER — Ambulatory Visit (INDEPENDENT_AMBULATORY_CARE_PROVIDER_SITE_OTHER): Payer: Medicare PPO | Admitting: Internal Medicine

## 2022-09-17 ENCOUNTER — Encounter: Payer: Self-pay | Admitting: Internal Medicine

## 2022-09-17 VITALS — BP 166/86 | HR 76 | Ht 67.0 in | Wt 133.4 lb

## 2022-09-17 DIAGNOSIS — R1031 Right lower quadrant pain: Secondary | ICD-10-CM | POA: Diagnosis not present

## 2022-09-17 DIAGNOSIS — R1032 Left lower quadrant pain: Secondary | ICD-10-CM

## 2022-09-17 DIAGNOSIS — E1159 Type 2 diabetes mellitus with other circulatory complications: Secondary | ICD-10-CM | POA: Diagnosis not present

## 2022-09-17 DIAGNOSIS — I10 Essential (primary) hypertension: Secondary | ICD-10-CM | POA: Diagnosis not present

## 2022-09-17 MED ORDER — LISINOPRIL 20 MG PO TABS
20.0000 mg | ORAL_TABLET | Freq: Every day | ORAL | 3 refills | Status: DC
Start: 2022-09-17 — End: 2022-11-27

## 2022-09-17 NOTE — Patient Instructions (Signed)
It was a pleasure to see you today.  Thank you for giving [name] the opportunity to be involved in your care.  Below is a brief recap of your visit and next steps.  We will plan to see you again in 3 months.  Summary Discontinue losartan in favor of lisinopril 20 mg daily Repeat urine study ordered today Paperwork for Ozempic completed today Follow up in 3 months

## 2022-09-17 NOTE — Assessment & Plan Note (Addendum)
He endorses a 109-month history of bilateral groin pain as described above.  No abnormalities or inguinal hernia are appreciated on exam today.  He endorses a history of heavy lifting. -Pelvic ultrasound ordered for further evaluation

## 2022-09-17 NOTE — Assessment & Plan Note (Signed)
A1c 8.9 on labs from May.  Greggory Keen was discontinued at his last appointment in favor of Ozempic due to cost.  He is additionally prescribed Lantus 24 units nightly, NovoLog 14 units 3 times daily with meals, and metformin 1000 mg twice daily.  He reports blood sugar readings consistently in the 100s, largely 150-170.  He has not started Ozempic yet. -Ozempic paperwork completed today -Continue current insulin regimen -Repeat urine microalbumin/creatinine ratio ordered today

## 2022-09-17 NOTE — Progress Notes (Signed)
Established Patient Office Visit  Subjective   Patient ID: Jimmy Duran, male    DOB: 02-12-1956  Age: 67 y.o. MRN: [id number]  Chief Complaint  Patient presents with   Hypertension    Follow up. Increased BP med to a whole tablet and BP runs higher   Jimmy Duran returns to care today for 4-week follow-up of HTN and DM.  He was last evaluated by me through video encounter on 6/18 at which time losartan was increased to 25 mg daily as BP remains significantly elevated.  Jimmy Duran was also discontinued in favor of Ozempic due to cost issues.  Jimmy Duran additionally endorsed right-sided chest discomfort that occurred intermittently.  I recommended that he present to the emergency department if symptoms persisted.  4-week follow-up was arranged.  There have been no acute interval events. Jimmy Duran reports feeling fairly well today.  His acute concern is a 63-month history of bilateral groin pain.  He has not appreciated any masses or lumps in his scrotum, but describes pain that radiates from his groin into his lower abdomen throughout the day.  He has been taking losartan 25 mg daily as instructed, but states that systolic blood pressure readings have recently reached 200 mmHg.  Past Medical History:  Diagnosis Date   AAA (abdominal aortic aneurysm) (HCC)    Arthritis    Carotid artery disease (HCC)    Cataract    Colon polyp    COPD (chronic obstructive pulmonary disease) (HCC)    Coronary artery disease    Nonobstructive   Essential hypertension    GERD (gastroesophageal reflux disease)    Hemorrhoids    History of TIA (transient ischemic attack)    Low back pain    Lumbar radiculopathy    Mixed hyperlipidemia    Palpitations    Peripheral vascular disease (HCC)    Shingles 09/23/2019   Stroke (HCC)    Tobacco dependence    Type 2 diabetes mellitus (HCC)    Past Surgical History:  Procedure Laterality Date   APPENDECTOMY  1970   BIOPSY  12/08/2017   Procedure: BIOPSY;   Surgeon: Corbin Ade, MD;  Location: [facility];  Service: Endoscopy;;  esophagus    BIOPSY  12/28/2020   Procedure: BIOPSY;  Surgeon: Corbin Ade, MD;  [facility]: [facility];  Service: Endoscopy;;   BIOPSY  07/12/2021   Procedure: BIOPSY;  Surgeon: Corbin Ade, MD;  [facility]: [facility];  Service: Endoscopy;;   BREAST SURGERY Right    Cyst resection on the right   CARDIAC CATHETERIZATION  1990's   in [place]. no stent placement   CATARACT EXTRACTION W/PHACO Right 06/18/2016   Procedure: CATARACT EXTRACTION PHACO AND INTRAOCULAR LENS PLACEMENT (IOC);  Surgeon: Galen Manila, MD;  Location: [facility];  Service: Ophthalmology;  Laterality: Right;  [name] 35.7 AP% 16.6 CDE 5.94 Fluid pack lot # [id number]   CATARACT EXTRACTION W/PHACO Left 07/16/2016   Procedure: CATARACT EXTRACTION PHACO AND INTRAOCULAR LENS PLACEMENT (IOC);  Surgeon: Galen Manila, MD;  Location: [facility];  Service: Ophthalmology;  Laterality: Left;  [name] 51.8 AP% 12.7 CDE 6.58 Fluid Pack Lot # [id number]   CHOLECYSTECTOMY  1993   COLONOSCOPY  02/12/2007   AOZ:[id number] friable anal canal hemorrhoids, otherwise normal rectum and colon   COLONOSCOPY N/A 07/16/2012   RMR: Colonic diverticulosis. 4 mm tubular adenoma   COLONOSCOPY WITH PROPOFOL N/A 12/08/2017   Dr. Jena Gauss: sigmoid and descending colon diverticulosis, transverse colon polyp (TUBULAR  ADENOMA)   COLONOSCOPY WITH PROPOFOL N/A 03/06/2022   Procedure: COLONOSCOPY WITH PROPOFOL;  Surgeon: Corbin Ade, MD;  [facility];  Service: Endoscopy;  Laterality: N/A;  12:30 PM   ENDARTERECTOMY Right 10/08/2019   Procedure: RIGHT CAROTID ENDARTERECTOMY;  Surgeon: Larina Earthly, MD;  Location: [facility];  Service: Vascular;  Laterality: Right;   ESOPHAGOGASTRODUODENOSCOPY (EGD) WITH ESOPHAGEAL DILATION N/A 07/16/2012   RMR: +Candida esophagitis, Erosive reflux esophagiits. Schatizi's ring status post dilation. Hiatal hernia. Antral erosions  status post biopsy.    ESOPHAGOGASTRODUODENOSCOPY (EGD) WITH PROPOFOL N/A 12/08/2017   Dr. Jena Gauss: esophagitis, query EOE but negative for increased eosinophils on path, small hiatal hernia, normal stomach, normal duodenum, PAS stain with rare yeast forms on stain. Empiric dilation   ESOPHAGOGASTRODUODENOSCOPY (EGD) WITH PROPOFOL N/A 12/28/2020   esophagitis without bleeding, s/p biopsy and dilation. Antral erosions s/p biopsy.   ESOPHAGOGASTRODUODENOSCOPY (EGD) WITH PROPOFOL N/A 07/12/2021   Surgeon: Corbin Ade, MD;  Cobblestoning appearance of distal esophagus, slight narrowing of distal esophagus s/p dilation with 56 Fr and biopsy, gastric erosions biopsied, normal examined duodenum.  Esophageal biopsy with reactive squamous mucosa, gastric biopsy with reactive gastropathy, negative for H. pylori.   ESOPHAGOGASTRODUODENOSCOPY (EGD) WITH PROPOFOL N/A 03/06/2022   Procedure: ESOPHAGOGASTRODUODENOSCOPY (EGD) WITH PROPOFOL;  Surgeon: Corbin Ade, MD;  [facility];  Service: Endoscopy;  Laterality: N/A;   EYE SURGERY Bilateral 2018   cataract   GIVENS CAPSULE STUDY N/A 03/13/2022   Procedure: GIVENS CAPSULE STUDY;  Surgeon: Corbin Ade, MD;  [facility];  Service: Endoscopy;  Laterality: N/A;  7:30am   HERNIA REPAIR     JOINT REPLACEMENT     KNEE SURGERY     Left knee arthroscopy torn medial meniscus grade 4 chondral changes medial femoral condyle tibial plateau   MALONEY DILATION N/A 12/08/2017   Procedure: MALONEY DILATION;  Surgeon: Corbin Ade, MD;  [facility];  Service: Endoscopy;  Laterality: N/A;   MALONEY DILATION N/A 12/28/2020   Procedure: [name] DILATION;  Surgeon: Corbin Ade, MD;  [facility];  Service: Endoscopy;  Laterality: N/A;   MALONEY DILATION N/A 07/12/2021   Procedure: [name] DILATION;  Surgeon: Corbin Ade, MD;  [facility];  Service: Endoscopy;  Laterality: N/A;   PATCH ANGIOPLASTY  Right 10/08/2019   Procedure: PATCH ANGIOPLASTY of the right common carotid artery using hemashield plaltinum finesse patch;  Surgeon: Larina Earthly, MD;  Location: [facility];  Service: Vascular;  Laterality: Right;   PERIPHERAL VASCULAR INTERVENTION  07/17/2020   Procedure: PERIPHERAL VASCULAR INTERVENTION;  Surgeon: Maeola Harman, MD;  [facility];  Service: Cardiovascular;;   POLYPECTOMY  12/08/2017   Procedure: POLYPECTOMY;  Surgeon: Corbin Ade, MD;  [facility];  Service: Endoscopy;;  colon   POLYPECTOMY  03/06/2022   Procedure: POLYPECTOMY;  Surgeon: Corbin Ade, MD;  [facility];  Service: Endoscopy;;   TOTAL KNEE ARTHROPLASTY Left 04/27/2012   Procedure: TOTAL KNEE ARTHROPLASTY;  Surgeon: Vickki Hearing, MD;  Location: [facility];  Service: Orthopedics;  Laterality: Left;   TRANSCAROTID ARTERY REVASCULARIZATION  Left 10/12/2021   Procedure: Left Transcarotid Artery Revascularization;  Surgeon: Maeola Harman, MD;  Location: [facility] OR;  Service: Vascular;  Laterality: Left;   UMBILICAL HERNIA REPAIR  1993   VISCERAL ANGIOGRAPHY N/A 07/17/2020   Procedure: mesenteric ANGIOGRAPHY;  Surgeon: Maeola Harman, MD;  Location:  [facility];  Service: Cardiovascular;  Laterality: N/A;   Social History   Tobacco Use   Smoking status: Every Day    Current packs/day: 0.50    Average packs/day: 0.5 packs/day for 30.0 years (15.0 ttl pk-yrs)    Types: Cigarettes   Smokeless tobacco: Former    Types: Chew    Quit date: 03/18/2018   Tobacco comments:    0.5 pk per day currently   Vaping Use   Vaping status: Former  Substance Use Topics   Alcohol use: Not Currently    Comment: socially "beer and tomato juice"    Drug use: No   Family History  Problem Relation Age of Onset   Dementia Mother    Hypertension Mother    Colon polyps Mother    Osteoporosis Mother    Cancer Father    Hypertension Father     Coronary artery disease Father        CABG in his 53's   Heart attack Father    Cancer - Lung Father    Heart disease Brother        has a pacemaker   Emphysema Maternal Grandmother    Cancer Maternal Grandmother    Cancer - Lung Maternal Grandfather    Diabetes Maternal Grandfather    Cancer Paternal Grandmother    Heart attack Paternal Grandfather    Colon cancer Other        maternal great aunt   Colon cancer Other        paternal great aunt   No Known Allergies  Review of Systems  Genitourinary:        Bilateral groin pain  All other systems reviewed and are negative.    Objective:     BP (!) 166/86   Pulse 76   Ht 5\' 7"  (1.702 m)   Wt 133 lb 6.4 oz (60.5 kg)   SpO2 94%   BMI 20.89 kg/m  BP Readings from Last 3 Encounters:  09/17/22 (!) 166/86  07/23/22 (!) 153/75  07/23/22 (!) 146/79   Physical Exam Vitals reviewed.  Constitutional:      General: He is not in acute distress.    Appearance: Normal appearance. He is not ill-appearing.  HENT:     Head: Normocephalic and atraumatic.     Right Ear: External ear normal.     Left Ear: External ear normal.     Nose: Nose normal. No congestion or rhinorrhea.     Mouth/Throat:     Mouth: Mucous membranes are moist.     Pharynx: Oropharynx is clear.  Eyes:     General: No scleral icterus.    Extraocular Movements: Extraocular movements intact.     Conjunctiva/sclera: Conjunctivae normal.     Pupils: Pupils are equal, round, and reactive to light.  Cardiovascular:     Rate and Rhythm: Normal rate and regular rhythm.     Pulses: Normal pulses.     Heart sounds: Murmur heard.  Pulmonary:     Effort: Pulmonary effort is normal.     Breath sounds: Normal breath sounds. No wheezing, rhonchi or rales.  Abdominal:     General: Abdomen is flat. Bowel sounds are normal. There is no distension.     Palpations: Abdomen is soft.     Tenderness: There is no abdominal tenderness.  Genitourinary:    Penis: Normal.       Testes: Normal.     Comments: No inguinal hernia is appreciated Musculoskeletal:  General: No swelling or deformity. Normal range of motion.     Cervical back: Normal range of motion.  Skin:    General: Skin is warm and dry.     Capillary Refill: Capillary refill takes less than 2 seconds.  Neurological:     General: No focal deficit present.     Mental Status: He is alert and oriented to person, place, and time.     Motor: No weakness.  Psychiatric:        Mood and Affect: Mood normal.        Behavior: Behavior normal.        Thought Content: Thought content normal.   Last CBC Lab Results  Component Value Date   WBC 9.2 08/02/2022   HGB 14.2 08/02/2022   HCT 43.7 08/02/2022   MCV 90 08/02/2022   MCH 29.2 08/02/2022   RDW 15.8 (H) 08/02/2022   PLT 326 08/02/2022   Last metabolic panel Lab Results  Component Value Date   GLUCOSE 229 (H) 03/07/2022   NA 138 03/07/2022   K 4.1 03/07/2022   CL 96 03/07/2022   CO2 21 03/07/2022   BUN 5 (L) 03/07/2022   CREATININE 0.81 03/07/2022   EGFR 98 03/07/2022   CALCIUM 9.2 03/07/2022   PROT 6.3 03/07/2022   ALBUMIN 4.1 03/07/2022   LABGLOB 2.2 03/07/2022   AGRATIO 1.9 03/07/2022   BILITOT <0.2 03/07/2022   ALKPHOS 96 03/07/2022   AST 20 03/07/2022   ALT 20 03/07/2022   ANIONGAP 12 02/25/2022   Last lipids Lab Results  Component Value Date   CHOL 92 02/25/2022   HDL 37 (L) 02/25/2022   LDLCALC 9 02/25/2022   TRIG 230 (H) 02/25/2022   CHOLHDL 2.5 02/25/2022   Last hemoglobin A1c Lab Results  Component Value Date   HGBA1C 8.9 (A) 07/23/2022   Last thyroid functions Lab Results  Component Value Date   TSH 1.790 10/18/2013     Assessment & Plan:   Problem List Items Addressed This Visit       Essential hypertension, benign - Primary    Presenting today for HTN follow-up.  BP remains poorly controlled.  He is currently prescribed losartan 25 mg daily.  He reports systolic readings that have reached 200  mmHg.  His blood pressure today is 166/86.  He is interested in discontinuing losartan in favor of lisinopril as previously prescribed.  Of note, he was previously prescribed lisinopril 20 mg daily and HTN was adequately controlled on this regimen.  Lisinopril was decreased and ultimately discontinued in the setting of hypotension.  I suspect there were additional factors contributing to hypotension. -Through shared decision making, losartan has been discontinued.  He will resume lisinopril 20 mg daily.  He was instructed to continue checking his blood pressure regularly at home.  He will follow-up with cardiology next month (8/13).      DM type 2 causing vascular disease (HCC)    A1c 8.9 on labs from May.  Jimmy Duran was discontinued at his last appointment in favor of Ozempic due to cost.  He is additionally prescribed Lantus 24 units nightly, NovoLog 14 units 3 times daily with meals, and metformin 1000 mg twice daily.  He reports blood sugar readings consistently in the 100s, largely 150-170.  He has not started Ozempic yet. -Ozempic paperwork completed today -Continue current insulin regimen -Repeat urine microalbumin/creatinine ratio ordered today      Bilateral groin pain    He endorses a 23-month history of  bilateral groin pain as described above.  No abnormalities or inguinal hernia are appreciated on exam today.  He endorses a history of heavy lifting. -Pelvic ultrasound ordered for further evaluation      Return in about 3 months (around 12/18/2022).   [name], MD

## 2022-09-17 NOTE — Assessment & Plan Note (Signed)
Presenting today for HTN follow-up.  BP remains poorly controlled.  He is currently prescribed losartan 25 mg daily.  He reports systolic readings that have reached 200 mmHg.  His blood pressure today is 166/86.  He is interested in discontinuing losartan in favor of lisinopril as previously prescribed.  Of note, he was previously prescribed lisinopril 20 mg daily and HTN was adequately controlled on this regimen.  Lisinopril was decreased and ultimately discontinued in the setting of hypotension.  I suspect there were additional factors contributing to hypotension. -Through shared decision making, losartan has been discontinued.  He will resume lisinopril 20 mg daily.  He was instructed to continue checking his blood pressure regularly at home.  He will follow-up with cardiology next month (8/13).

## 2022-09-18 ENCOUNTER — Other Ambulatory Visit: Payer: Self-pay

## 2022-09-19 LAB — MICROALBUMIN / CREATININE URINE RATIO
Creatinine, Urine: 86.1 mg/dL
Microalb/Creat Ratio: 60 mg/g creat — ABNORMAL HIGH (ref 0–29)
Microalbumin, Urine: 51.8 ug/mL

## 2022-09-23 ENCOUNTER — Other Ambulatory Visit: Payer: Self-pay

## 2022-09-23 DIAGNOSIS — J449 Chronic obstructive pulmonary disease, unspecified: Secondary | ICD-10-CM

## 2022-09-23 MED ORDER — ALBUTEROL SULFATE (2.5 MG/3ML) 0.083% IN NEBU
2.5000 mg | INHALATION_SOLUTION | Freq: Four times a day (QID) | RESPIRATORY_TRACT | 1 refills | Status: DC | PRN
Start: 2022-09-23 — End: 2023-03-19

## 2022-09-25 ENCOUNTER — Ambulatory Visit (HOSPITAL_COMMUNITY): Payer: Medicare PPO

## 2022-09-28 DIAGNOSIS — E1159 Type 2 diabetes mellitus with other circulatory complications: Secondary | ICD-10-CM | POA: Diagnosis not present

## 2022-10-01 ENCOUNTER — Other Ambulatory Visit: Payer: Self-pay | Admitting: Cardiology

## 2022-10-03 ENCOUNTER — Ambulatory Visit (HOSPITAL_COMMUNITY)
Admission: RE | Admit: 2022-10-03 | Discharge: 2022-10-03 | Disposition: A | Discharge status: Home / Self Care | Payer: Medicare PPO | Source: Ambulatory Visit | Attending: Internal Medicine | Admitting: Internal Medicine

## 2022-10-03 DIAGNOSIS — R1031 Right lower quadrant pain: Secondary | ICD-10-CM | POA: Insufficient documentation

## 2022-10-03 DIAGNOSIS — R1032 Left lower quadrant pain: Secondary | ICD-10-CM | POA: Diagnosis not present

## 2022-10-08 ENCOUNTER — Encounter: Payer: Self-pay | Admitting: Internal Medicine

## 2022-10-10 ENCOUNTER — Encounter: Payer: Self-pay | Admitting: Internal Medicine

## 2022-10-11 NOTE — Telephone Encounter (Signed)
Responded to patient about ultrasound in previous message.

## 2022-10-12 ENCOUNTER — Inpatient Hospital Stay (HOSPITAL_COMMUNITY): Payer: Medicare PPO

## 2022-10-12 ENCOUNTER — Emergency Department (HOSPITAL_COMMUNITY): Payer: Medicare PPO

## 2022-10-12 ENCOUNTER — Inpatient Hospital Stay (HOSPITAL_COMMUNITY)
Admission: EM | Admit: 2022-10-12 | Discharge: 2022-10-15 | DRG: 101 | Disposition: A | Discharge status: Home / Self Care | Payer: Medicare PPO | Attending: Neurology | Admitting: Neurology

## 2022-10-12 DIAGNOSIS — E782 Mixed hyperlipidemia: Secondary | ICD-10-CM | POA: Diagnosis present

## 2022-10-12 DIAGNOSIS — Z833 Family history of diabetes mellitus: Secondary | ICD-10-CM

## 2022-10-12 DIAGNOSIS — R4182 Altered mental status, unspecified: Secondary | ICD-10-CM | POA: Diagnosis not present

## 2022-10-12 DIAGNOSIS — N1330 Unspecified hydronephrosis: Secondary | ICD-10-CM | POA: Diagnosis not present

## 2022-10-12 DIAGNOSIS — G8384 Todd's paralysis (postepileptic): Secondary | ICD-10-CM | POA: Diagnosis present

## 2022-10-12 DIAGNOSIS — Z9049 Acquired absence of other specified parts of digestive tract: Secondary | ICD-10-CM

## 2022-10-12 DIAGNOSIS — I6501 Occlusion and stenosis of right vertebral artery: Secondary | ICD-10-CM | POA: Diagnosis not present

## 2022-10-12 DIAGNOSIS — I672 Cerebral atherosclerosis: Secondary | ICD-10-CM | POA: Diagnosis not present

## 2022-10-12 DIAGNOSIS — G8191 Hemiplegia, unspecified affecting right dominant side: Secondary | ICD-10-CM | POA: Diagnosis present

## 2022-10-12 DIAGNOSIS — Z800 Family history of malignant neoplasm of digestive organs: Secondary | ICD-10-CM

## 2022-10-12 DIAGNOSIS — I959 Hypotension, unspecified: Secondary | ICD-10-CM | POA: Diagnosis not present

## 2022-10-12 DIAGNOSIS — F17210 Nicotine dependence, cigarettes, uncomplicated: Secondary | ICD-10-CM | POA: Diagnosis present

## 2022-10-12 DIAGNOSIS — N400 Enlarged prostate without lower urinary tract symptoms: Secondary | ICD-10-CM | POA: Diagnosis present

## 2022-10-12 DIAGNOSIS — Z79899 Other long term (current) drug therapy: Secondary | ICD-10-CM

## 2022-10-12 DIAGNOSIS — R9431 Abnormal electrocardiogram [ECG] [EKG]: Secondary | ICD-10-CM | POA: Diagnosis not present

## 2022-10-12 DIAGNOSIS — I6782 Cerebral ischemia: Secondary | ICD-10-CM | POA: Diagnosis not present

## 2022-10-12 DIAGNOSIS — I161 Hypertensive emergency: Secondary | ICD-10-CM | POA: Diagnosis present

## 2022-10-12 DIAGNOSIS — I2510 Atherosclerotic heart disease of native coronary artery without angina pectoris: Secondary | ICD-10-CM | POA: Diagnosis present

## 2022-10-12 DIAGNOSIS — J441 Chronic obstructive pulmonary disease with (acute) exacerbation: Secondary | ICD-10-CM | POA: Diagnosis not present

## 2022-10-12 DIAGNOSIS — E1151 Type 2 diabetes mellitus with diabetic peripheral angiopathy without gangrene: Secondary | ICD-10-CM | POA: Diagnosis not present

## 2022-10-12 DIAGNOSIS — G9389 Other specified disorders of brain: Secondary | ICD-10-CM | POA: Diagnosis not present

## 2022-10-12 DIAGNOSIS — Z7984 Long term (current) use of oral hypoglycemic drugs: Secondary | ICD-10-CM

## 2022-10-12 DIAGNOSIS — I708 Atherosclerosis of other arteries: Secondary | ICD-10-CM | POA: Diagnosis not present

## 2022-10-12 DIAGNOSIS — Z794 Long term (current) use of insulin: Secondary | ICD-10-CM | POA: Diagnosis not present

## 2022-10-12 DIAGNOSIS — E876 Hypokalemia: Secondary | ICD-10-CM | POA: Diagnosis present

## 2022-10-12 DIAGNOSIS — R0689 Other abnormalities of breathing: Secondary | ICD-10-CM | POA: Diagnosis present

## 2022-10-12 DIAGNOSIS — E43 Unspecified severe protein-calorie malnutrition: Secondary | ICD-10-CM | POA: Insufficient documentation

## 2022-10-12 DIAGNOSIS — Z8249 Family history of ischemic heart disease and other diseases of the circulatory system: Secondary | ICD-10-CM

## 2022-10-12 DIAGNOSIS — R1310 Dysphagia, unspecified: Secondary | ICD-10-CM | POA: Diagnosis present

## 2022-10-12 DIAGNOSIS — J432 Centrilobular emphysema: Secondary | ICD-10-CM | POA: Diagnosis not present

## 2022-10-12 DIAGNOSIS — G4089 Other seizures: Principal | ICD-10-CM | POA: Diagnosis present

## 2022-10-12 DIAGNOSIS — I771 Stricture of artery: Secondary | ICD-10-CM | POA: Diagnosis not present

## 2022-10-12 DIAGNOSIS — I6389 Other cerebral infarction: Secondary | ICD-10-CM | POA: Diagnosis not present

## 2022-10-12 DIAGNOSIS — N134 Hydroureter: Secondary | ICD-10-CM | POA: Diagnosis not present

## 2022-10-12 DIAGNOSIS — Z95828 Presence of other vascular implants and grafts: Secondary | ICD-10-CM

## 2022-10-12 DIAGNOSIS — R411 Anterograde amnesia: Secondary | ICD-10-CM | POA: Diagnosis present

## 2022-10-12 DIAGNOSIS — R569 Unspecified convulsions: Principal | ICD-10-CM

## 2022-10-12 DIAGNOSIS — Z825 Family history of asthma and other chronic lower respiratory diseases: Secondary | ICD-10-CM

## 2022-10-12 DIAGNOSIS — I119 Hypertensive heart disease without heart failure: Secondary | ICD-10-CM | POA: Diagnosis not present

## 2022-10-12 DIAGNOSIS — I639 Cerebral infarction, unspecified: Secondary | ICD-10-CM | POA: Diagnosis present

## 2022-10-12 DIAGNOSIS — I779 Disorder of arteries and arterioles, unspecified: Secondary | ICD-10-CM | POA: Diagnosis present

## 2022-10-12 DIAGNOSIS — D72829 Elevated white blood cell count, unspecified: Secondary | ICD-10-CM | POA: Diagnosis present

## 2022-10-12 DIAGNOSIS — K219 Gastro-esophageal reflux disease without esophagitis: Secondary | ICD-10-CM | POA: Diagnosis present

## 2022-10-12 DIAGNOSIS — R4701 Aphasia: Secondary | ICD-10-CM | POA: Diagnosis present

## 2022-10-12 DIAGNOSIS — I471 Supraventricular tachycardia, unspecified: Secondary | ICD-10-CM | POA: Diagnosis not present

## 2022-10-12 DIAGNOSIS — G40109 Localization-related (focal) (partial) symptomatic epilepsy and epileptic syndromes with simple partial seizures, not intractable, without status epilepticus: Secondary | ICD-10-CM | POA: Diagnosis not present

## 2022-10-12 DIAGNOSIS — Z8679 Personal history of other diseases of the circulatory system: Secondary | ICD-10-CM

## 2022-10-12 DIAGNOSIS — Z83719 Family history of colon polyps, unspecified: Secondary | ICD-10-CM

## 2022-10-12 DIAGNOSIS — Z8262 Family history of osteoporosis: Secondary | ICD-10-CM

## 2022-10-12 DIAGNOSIS — Z7982 Long term (current) use of aspirin: Secondary | ICD-10-CM

## 2022-10-12 DIAGNOSIS — Z820 Family history of epilepsy and other diseases of the nervous system: Secondary | ICD-10-CM

## 2022-10-12 DIAGNOSIS — E1165 Type 2 diabetes mellitus with hyperglycemia: Secondary | ICD-10-CM | POA: Diagnosis not present

## 2022-10-12 DIAGNOSIS — I69391 Dysphagia following cerebral infarction: Secondary | ICD-10-CM

## 2022-10-12 DIAGNOSIS — R34 Anuria and oliguria: Secondary | ICD-10-CM | POA: Diagnosis not present

## 2022-10-12 DIAGNOSIS — R29898 Other symptoms and signs involving the musculoskeletal system: Secondary | ICD-10-CM | POA: Diagnosis not present

## 2022-10-12 DIAGNOSIS — I6523 Occlusion and stenosis of bilateral carotid arteries: Secondary | ICD-10-CM | POA: Diagnosis not present

## 2022-10-12 DIAGNOSIS — Z7902 Long term (current) use of antithrombotics/antiplatelets: Secondary | ICD-10-CM

## 2022-10-12 DIAGNOSIS — Z86010 Personal history of colonic polyps: Secondary | ICD-10-CM

## 2022-10-12 DIAGNOSIS — I6503 Occlusion and stenosis of bilateral vertebral arteries: Secondary | ICD-10-CM | POA: Diagnosis not present

## 2022-10-12 DIAGNOSIS — Z818 Family history of other mental and behavioral disorders: Secondary | ICD-10-CM

## 2022-10-12 DIAGNOSIS — K5730 Diverticulosis of large intestine without perforation or abscess without bleeding: Secondary | ICD-10-CM | POA: Diagnosis not present

## 2022-10-12 DIAGNOSIS — R29818 Other symptoms and signs involving the nervous system: Secondary | ICD-10-CM | POA: Diagnosis not present

## 2022-10-12 DIAGNOSIS — Z96652 Presence of left artificial knee joint: Secondary | ICD-10-CM | POA: Diagnosis present

## 2022-10-12 DIAGNOSIS — Z7985 Long-term (current) use of injectable non-insulin antidiabetic drugs: Secondary | ICD-10-CM

## 2022-10-12 DIAGNOSIS — Z4682 Encounter for fitting and adjustment of non-vascular catheter: Secondary | ICD-10-CM | POA: Diagnosis not present

## 2022-10-12 LAB — DIFFERENTIAL
Abs Immature Granulocytes: 0.01 10*3/uL (ref 0.00–0.07)
Basophils Absolute: 0.1 10*3/uL (ref 0.0–0.1)
Basophils Relative: 2 %
Eosinophils Absolute: 0.3 10*3/uL (ref 0.0–0.5)
Eosinophils Relative: 5 %
Immature Granulocytes: 0 %
Lymphocytes Relative: 28 %
Lymphs Abs: 1.9 10*3/uL (ref 0.7–4.0)
Monocytes Absolute: 0.7 10*3/uL (ref 0.1–1.0)
Monocytes Relative: 10 %
Neutro Abs: 3.8 10*3/uL (ref 1.7–7.7)
Neutrophils Relative %: 55 %

## 2022-10-12 LAB — COMPREHENSIVE METABOLIC PANEL
ALT: 11 U/L (ref 0–44)
AST: 17 U/L (ref 15–41)
Albumin: 4.2 g/dL (ref 3.5–5.0)
Alkaline Phosphatase: 44 U/L (ref 38–126)
Anion gap: 13 (ref 5–15)
BUN: 9 mg/dL (ref 8–23)
CO2: 25 mmol/L (ref 22–32)
Calcium: 9.3 mg/dL (ref 8.9–10.3)
Chloride: 99 mmol/L (ref 98–111)
Creatinine, Ser: 0.88 mg/dL (ref 0.61–1.24)
GFR, Estimated: >60 mL/min (ref >60)
Glucose, Bld: 261 mg/dL — ABNORMAL HIGH (ref 70–99)
Potassium: 3.4 mmol/L — ABNORMAL LOW (ref 3.5–5.1)
Sodium: 137 mmol/L (ref 135–145)
Total Bilirubin: 0.5 mg/dL (ref 0.3–1.2)
Total Protein: 7 g/dL (ref 6.5–8.1)

## 2022-10-12 LAB — GLUCOSE, CAPILLARY: Glucose-Capillary: 365 mg/dL — ABNORMAL HIGH (ref 70–99)

## 2022-10-12 LAB — BLOOD GAS, ARTERIAL
Acid-base deficit: 1.3 mmol/L (ref 0.0–2.0)
Bicarbonate: 23.7 mmol/L (ref 20.0–28.0)
O2 Saturation: 98.8 %
Patient temperature: 35.9
pCO2 arterial: 38 mmHg (ref 32–48)
pH, Arterial: 7.4 (ref 7.35–7.45)
pO2, Arterial: 152 mmHg — ABNORMAL HIGH (ref 83–108)

## 2022-10-12 LAB — CBC
HCT: 41.5 % (ref 39.0–52.0)
Hemoglobin: 14.3 g/dL (ref 13.0–17.0)
MCH: 30.8 pg (ref 26.0–34.0)
MCHC: 34.5 g/dL (ref 30.0–36.0)
MCV: 89.2 fL (ref 80.0–100.0)
Platelets: 315 10*3/uL (ref 150–400)
RBC: 4.65 MIL/uL (ref 4.22–5.81)
RDW: 12.9 % (ref 11.5–15.5)
WBC: 6.7 10*3/uL (ref 4.0–10.5)
nRBC: 0 % (ref 0.0–0.2)

## 2022-10-12 LAB — PROTIME-INR
INR: 1 (ref 0.8–1.2)
Prothrombin Time: 13.3 seconds (ref 11.4–15.2)

## 2022-10-12 LAB — CBG MONITORING, ED: Glucose-Capillary: 233 mg/dL — ABNORMAL HIGH (ref 70–99)

## 2022-10-12 LAB — APTT: aPTT: 27 seconds (ref 24–36)

## 2022-10-12 LAB — ETHANOL: Alcohol, Ethyl (B): <10 mg/dL (ref <10)

## 2022-10-12 MED ORDER — LEVALBUTEROL HCL 0.63 MG/3ML IN NEBU: 0.6300 mg | INHALATION_SOLUTION | RESPIRATORY_TRACT | Status: DC | PRN

## 2022-10-12 MED ORDER — CLEVIDIPINE BUTYRATE 0.5 MG/ML IV EMUL
0.0000 mg/h | INTRAVENOUS | Status: DC
  Filled 2022-10-12 (×2): qty 50

## 2022-10-12 MED ORDER — FENTANYL CITRATE PF 50 MCG/ML IJ SOSY
PREFILLED_SYRINGE | INTRAMUSCULAR | Status: AC
  Filled 2022-10-12: qty 1

## 2022-10-12 MED ORDER — DIAZEPAM 5 MG/ML IJ SOLN: 5.0000 mg | Freq: Once | INTRAMUSCULAR | Status: AC

## 2022-10-12 MED ORDER — FENTANYL CITRATE PF 50 MCG/ML IJ SOSY
12.5000 ug | PREFILLED_SYRINGE | Freq: Once | INTRAMUSCULAR | Status: AC
  Administered 2022-10-12: 12.5 ug via INTRAVENOUS

## 2022-10-12 MED ORDER — DOCUSATE SODIUM 50 MG/5ML PO LIQD
100.0000 mg | Freq: Two times a day (BID) | ORAL | Status: DC
  Administered 2022-10-12 – 2022-10-14 (×5): 100 mg
  Filled 2022-10-12 (×5): qty 10

## 2022-10-12 MED ORDER — ROSUVASTATIN CALCIUM 20 MG PO TABS
40.0000 mg | ORAL_TABLET | Freq: Every day | ORAL | Status: DC
  Administered 2022-10-13: 40 mg
  Filled 2022-10-12 (×2): qty 2

## 2022-10-12 MED ORDER — LORAZEPAM 2 MG/ML IJ SOLN
INTRAMUSCULAR | Status: AC
  Administered 2022-10-12: 2 mg via INTRAVENOUS
  Filled 2022-10-12: qty 1

## 2022-10-12 MED ORDER — LORAZEPAM 2 MG/ML IJ SOLN: 2.0000 mg | Freq: Once | INTRAMUSCULAR | Status: AC

## 2022-10-12 MED ORDER — LABETALOL HCL 5 MG/ML IV SOLN
20.0000 mg | Freq: Once | INTRAVENOUS | Status: AC
  Administered 2022-10-12: 20 mg via INTRAVENOUS
  Filled 2022-10-12: qty 4

## 2022-10-12 MED ORDER — HYDRALAZINE HCL 20 MG/ML IJ SOLN
10.0000 mg | Freq: Once | INTRAMUSCULAR | Status: AC
  Administered 2022-10-12: 10 mg via INTRAVENOUS
  Filled 2022-10-12: qty 1

## 2022-10-12 MED ORDER — ORAL CARE MOUTH RINSE: 15.0000 mL | OROMUCOSAL | Status: DC | PRN

## 2022-10-12 MED ORDER — FENTANYL CITRATE PF 50 MCG/ML IJ SOSY: 12.5000 ug | PREFILLED_SYRINGE | Freq: Once | INTRAMUSCULAR | Status: DC

## 2022-10-12 MED ORDER — LABETALOL HCL 5 MG/ML IV SOLN: 20.0000 mg | Freq: Once | INTRAVENOUS | Status: AC

## 2022-10-12 MED ORDER — ORAL CARE MOUTH RINSE
15.0000 mL | OROMUCOSAL | Status: DC
  Administered 2022-10-12 – 2022-10-14 (×24): 15 mL via OROMUCOSAL

## 2022-10-12 MED ORDER — POLYETHYLENE GLYCOL 3350 17 G PO PACK
17.0000 g | PACK | Freq: Every day | ORAL | Status: DC
  Administered 2022-10-13: 17 g
  Filled 2022-10-12 (×2): qty 1

## 2022-10-12 MED ORDER — FENTANYL CITRATE PF 50 MCG/ML IJ SOSY
50.0000 ug | PREFILLED_SYRINGE | INTRAMUSCULAR | Status: DC | PRN
  Administered 2022-10-12 – 2022-10-14 (×3): 100 ug via INTRAVENOUS
  Filled 2022-10-12 (×4): qty 2

## 2022-10-12 MED ORDER — FAMOTIDINE 20 MG PO TABS
20.0000 mg | ORAL_TABLET | Freq: Two times a day (BID) | ORAL | Status: DC
  Administered 2022-10-12 – 2022-10-14 (×4): 20 mg
  Filled 2022-10-12 (×5): qty 1

## 2022-10-12 MED ORDER — ETOMIDATE 2 MG/ML IV SOLN
INTRAVENOUS | Status: AC
  Administered 2022-10-12: 20 mg
  Filled 2022-10-12: qty 20

## 2022-10-12 MED ORDER — ORAL CARE MOUTH RINSE: 15.0000 mL | OROMUCOSAL | Status: DC

## 2022-10-12 MED ORDER — TENECTEPLASE FOR STROKE
PACK | INTRAVENOUS | Status: AC
  Administered 2022-10-12: 15 mg via INTRAVENOUS
  Filled 2022-10-12: qty 10

## 2022-10-12 MED ORDER — POTASSIUM CHLORIDE 10 MEQ/100ML IV SOLN
10.0000 meq | INTRAVENOUS | Status: AC
  Administered 2022-10-13 (×4): 10 meq via INTRAVENOUS
  Filled 2022-10-12 (×4): qty 100

## 2022-10-12 MED ORDER — INSULIN ASPART 100 UNIT/ML IJ SOLN
0.0000 [IU] | INTRAMUSCULAR | Status: DC
  Administered 2022-10-12: 9 [IU] via SUBCUTANEOUS
  Administered 2022-10-13 (×2): 3 [IU] via SUBCUTANEOUS
  Administered 2022-10-13: 2 [IU] via SUBCUTANEOUS
  Administered 2022-10-13 (×2): 3 [IU] via SUBCUTANEOUS
  Administered 2022-10-14 (×2): 2 [IU] via SUBCUTANEOUS
  Administered 2022-10-14 (×2): 3 [IU] via SUBCUTANEOUS
  Administered 2022-10-14: 2 [IU] via SUBCUTANEOUS
  Administered 2022-10-14 – 2022-10-15 (×2): 3 [IU] via SUBCUTANEOUS
  Administered 2022-10-15: 1 [IU] via SUBCUTANEOUS
  Administered 2022-10-15: 2 [IU] via SUBCUTANEOUS
  Administered 2022-10-15: 3 [IU] via SUBCUTANEOUS

## 2022-10-12 MED ORDER — CHLORHEXIDINE GLUCONATE CLOTH 2 % EX PADS
6.0000 | MEDICATED_PAD | Freq: Every day | CUTANEOUS | Status: DC
  Administered 2022-10-14 – 2022-10-15 (×2): 6 via TOPICAL

## 2022-10-12 MED ORDER — CLEVIDIPINE BUTYRATE 0.5 MG/ML IV EMUL
INTRAVENOUS | Status: AC
  Administered 2022-10-12: 2 mg/h via INTRAVENOUS
  Filled 2022-10-12: qty 50

## 2022-10-12 MED ORDER — LORAZEPAM 2 MG/ML IJ SOLN: 2.0000 mg | Freq: Once | INTRAMUSCULAR | Status: DC

## 2022-10-12 MED ORDER — DIAZEPAM 5 MG/ML IJ SOLN
INTRAMUSCULAR | Status: AC
  Administered 2022-10-12: 5 mg via INTRAVENOUS
  Filled 2022-10-12: qty 2

## 2022-10-12 MED ORDER — PROPOFOL 1000 MG/100ML IV EMUL
5.0000 ug/kg/min | INTRAVENOUS | Status: DC
  Administered 2022-10-12: 40 ug/kg/min via INTRAVENOUS
  Filled 2022-10-12: qty 100

## 2022-10-12 MED ORDER — IOHEXOL 350 MG/ML SOLN
75.0000 mL | Freq: Once | INTRAVENOUS | Status: AC | PRN
  Administered 2022-10-12: 75 mL via INTRAVENOUS

## 2022-10-12 MED ORDER — PROPOFOL 1000 MG/100ML IV EMUL
0.0000 ug/kg/min | INTRAVENOUS | Status: DC
  Administered 2022-10-13: 30 ug/kg/min via INTRAVENOUS
  Filled 2022-10-12: qty 100

## 2022-10-12 MED ORDER — LABETALOL HCL 5 MG/ML IV SOLN
INTRAVENOUS | Status: AC
  Administered 2022-10-12: 20 mg via INTRAVENOUS
  Filled 2022-10-12: qty 4

## 2022-10-12 MED ORDER — INSULIN GLARGINE-YFGN 100 UNIT/ML ~~LOC~~ SOLN
12.0000 [IU] | Freq: Every day | SUBCUTANEOUS | Status: DC
  Administered 2022-10-13 – 2022-10-15 (×3): 12 [IU] via SUBCUTANEOUS
  Filled 2022-10-12 (×3): qty 0.12

## 2022-10-12 MED ORDER — METOPROLOL TARTRATE 25 MG PO TABS
25.0000 mg | ORAL_TABLET | Freq: Two times a day (BID) | ORAL | Status: DC
  Administered 2022-10-12 – 2022-10-14 (×3): 25 mg
  Filled 2022-10-12 (×4): qty 1

## 2022-10-12 MED ORDER — FENTANYL CITRATE PF 50 MCG/ML IJ SOSY
50.0000 ug | PREFILLED_SYRINGE | INTRAMUSCULAR | Status: DC | PRN
  Administered 2022-10-13 – 2022-10-14 (×2): 50 ug via INTRAVENOUS
  Filled 2022-10-12: qty 1

## 2022-10-12 MED ORDER — TENECTEPLASE FOR STROKE: 0.2500 mg/kg | PACK | Freq: Once | INTRAVENOUS | Status: AC

## 2022-10-12 MED ORDER — MAGNESIUM SULFATE 2 GM/50ML IV SOLN
2.0000 g | Freq: Once | INTRAVENOUS | Status: AC
  Administered 2022-10-12: 2 g via INTRAVENOUS
  Filled 2022-10-12: qty 50

## 2022-10-12 MED ORDER — ROCURONIUM BROMIDE 10 MG/ML (PF) SYRINGE
PREFILLED_SYRINGE | INTRAVENOUS | Status: AC
  Administered 2022-10-12: 100 mg
  Filled 2022-10-12: qty 10

## 2022-10-12 NOTE — Consult Note (Signed)
[facility] Telemedicine Consult  Requesting Provider: Dr. Eber Hong Consult Participants: Dr. Marthe Patch, Telespecialist RN Saunders Glance   bedside RN Cammy Copa Location of the provider: Home Location of the patient: [name]-ER-bed to  This consult was provided via telemedicine with 2-way video and audio communication. The patient/family was informed that care would be provided in this way and agreed to receive care in this manner.   Chief Complaint: Right-sided weakness and aphasia  HPI: 67 year old man with past medical history of multiple TIAs, and bilateral carotid revascularizations due to carotid artery stenosis bilaterally, AAA, tobacco abuse, peripheral vascular disease, type 2 diabetes, presented to the emergency department for evaluation of sudden onset of right-sided weakness and aphasia.  According to family he was last known well at 3:30 PM when they came back from grocery shopping and appeared to be in his normal state of health.  He went out to the backyard and as the wife is cooking, she saw him stumbling as if he had been drinking.  He does not drink alcohol and that was unusual for him.  When the family went to check on him, his speech was not making sense and he was stumbling on with weakness to the right.  Family brought him to the ER for further evaluation as they live in [place] and did not want him to be taken to [place]. Code stroke was activated.  Patient was seen via telestroke.   Past Medical History:  Diagnosis Date   AAA (abdominal aortic aneurysm) (HCC)    Arthritis    Carotid artery disease (HCC)    Cataract    Colon polyp    COPD (chronic obstructive pulmonary disease) (HCC)    Coronary artery disease    Nonobstructive   Essential hypertension    GERD (gastroesophageal reflux disease)    Hemorrhoids    History of TIA (transient ischemic attack)    Low back pain    Lumbar radiculopathy    Mixed hyperlipidemia    Palpitations     Peripheral vascular disease (HCC)    Shingles 09/23/2019   Stroke (HCC)    Tobacco dependence    Type 2 diabetes mellitus (HCC)      Current Facility-Administered Medications:    labetalol (NORMODYNE) injection 20 mg, 20 mg, Intravenous, Once **AND** clevidipine (CLEVIPREX) infusion 0.5 mg/mL, 0-21 mg/hr, Intravenous, Continuous, Milon Dikes, MD   hydrALAZINE (APRESOLINE) injection 10 mg, 10 mg, Intravenous, Once, Milon Dikes, MD   tenecteplase [facility]) injection for Stroke 0.25 mg/kg, 0.25 mg/kg, Intravenous, Once, Milon Dikes, MD  Current Outpatient Medications:    albuterol (PROVENTIL) (2.5 MG/3ML) 0.083% nebulizer solution, Take 3 mLs (2.5 mg total) by nebulization every 6 (six) hours as needed for wheezing or shortness of breath., Disp: 150 mL, Rfl: 1   albuterol (VENTOLIN HFA) 108 (90 Base) MCG/ACT inhaler, Inhale 2 puffs into the lungs every 6 (six) hours as needed for wheezing or shortness of breath., Disp: 8 g, Rfl: 2   aspirin EC 81 MG tablet, Take 1 tablet (81 mg total) by mouth daily at 6 (six) AM. Swallow whole., Disp: 30 tablet, Rfl: 12   azithromycin (ZITHROMAX Z-PAK) 250 MG tablet, Take 2 tablets (500 mg) PO today, then 1 tablet (250 mg) PO daily x4 days., Disp: 6 tablet, Rfl: 0   benzonatate (TESSALON PERLES) 100 MG capsule, Take 2 capsules (200 mg total) by mouth 3 (three) times daily as needed for cough., Disp: 30 capsule, Rfl: 1   clopidogrel (PLAVIX) 75 MG  tablet, Take 1 tablet by mouth once daily, Disp: 30 tablet, Rfl: 0   Continuous Blood Gluc Sensor (FREESTYLE LIBRE 3 SENSOR) MISC, 1 each by Does not apply route every 14 (fourteen) days. Place 1 sensor on the skin every 14 days. Use to check glucose continuously, Disp: 2 each, Rfl: 2   dicyclomine (BENTYL) 20 MG tablet, TAKE 1 TABLET BY MOUTH 4 TIMES DAILY AS NEEDED FOR SPASMS, Disp: 30 tablet, Rfl: 0   diphenhydrAMINE (BENADRYL) 25 MG tablet, Take 25 mg by mouth daily as needed for allergies. , Disp: , Rfl:     famotidine (PEPCID) 20 MG tablet, Take 1 tablet (20 mg total) by mouth 2 (two) times daily., Disp: 60 tablet, Rfl: 3   fenofibrate (TRICOR) 145 MG tablet, Take 1 tablet (145 mg total) by mouth daily., Disp: 90 tablet, Rfl: 3   Glucagon (GVOKE HYPOPEN 2-PACK) 1 MG/0.2ML SOAJ, Inject 1 mg into the skin as needed (hypoglycemia)., Disp: 0.2 mL, Rfl: 1   insulin glargine (LANTUS) 100 UNIT/ML injection, Inject 0.24 mLs (24 Units total) into the skin daily., Disp: 10 mL, Rfl: PRN   lansoprazole (PREVACID) 30 MG capsule, TAKE 1 CAPSULE BY MOUTH TWICE DAILY BEFORE A MEAL, Disp: 60 capsule, Rfl: 11   lisinopril (ZESTRIL) 20 MG tablet, Take 1 tablet (20 mg total) by mouth daily., Disp: 90 tablet, Rfl: 3   metFORMIN (GLUCOPHAGE) 1000 MG tablet, TAKE 1 TABLET BY MOUTH TWICE DAILY WITH A MEAL, Disp: 60 tablet, Rfl: 0   metoprolol tartrate (LOPRESSOR) 25 MG tablet, Take 1 tablet (25 mg total) by mouth 2 (two) times daily., Disp: 60 tablet, Rfl: 6   nitroGLYCERIN (NITROSTAT) 0.4 MG SL tablet, Place 1 tablet (0.4 mg total) under the tongue every 5 (five) minutes as needed for chest pain (Do not excieed more than 3 tablets in 15 minutes.)., Disp: 25 tablet, Rfl: 3   NOVOLOG 100 UNIT/ML injection, Inject 14 Units into the skin 3 (three) times daily with meals., Disp: 10 mL, Rfl: 11   rosuvastatin (CRESTOR) 40 MG tablet, Take 1 tablet by mouth once daily, Disp: 90 tablet, Rfl: 0   Semaglutide,0.25 or 0.5MG /DOS, (OZEMPIC, 0.25 OR 0.5 MG/DOSE,) 2 MG/3ML SOPN, Inject 0.25 mg into the skin once a week., Disp: 3 mL, Rfl: 0    LKW: 3:30 PM Notification received 1753 On screen 1755-waited for patient who was in CT. Examined on arrival to room IV thrombolysis given?:  Yes IR Thrombectomy? No, no ELVO Modified Rankin Scale: 1-No significant post stroke disability and can perform usual duties with stroke symptoms  Exam: Vitals:   10/12/22 1745  Pulse: 93  Resp: (!) 22  SpO2: 94%    General: Somewhat agitated,  cachectic appearing man Neurological exam He is awake alert agitated Does not follow simple commands His speech is garbled Cranial nerves: Pupils are equal round react light, it does look like he prefers the left but he is able to look to the right when the nurse checks on him, blink to threat is unreliable, face appears grossly symmetric. Motor examination with significant drift in the right upper and lower extremity with 4 -/5 strength bilaterally at best.  Left side is full strength with no drift. Sensory: Possible right-sided neglect and diminished sensation Coordination difficult to assess as he does not follow any commands and is very agitated.   NIHSS 1A: Level of Consciousness - 0 1B: Ask Month and Age - 2 1C: 'Blink Eyes' & 'Squeeze Hands' - 2 2: Test  Horizontal Extraocular Movements - 0 3: Test Visual Fields - 1 4: Test Facial Palsy - 0 5A: Test Left Arm Motor Drift - 0 5B: Test Right Arm Motor Drift - 2 6A: Test Left Leg Motor Drift - 0 6B: Test Right Leg Motor Drift - 2 7: Test Limb Ataxia - 0 8: Test Sensation - 1 9: Test Language/Aphasia- 2 10: Test Dysarthria - 2 11: Test Extinction/Inattention - 0 NIHSS score: 14   Imaging Reviewed: CTH code stroke - possible hypoattenuation in left parietal lobe. No bleed. CTA Head and neck with no ELVO. Final read pending. Please review rads read once available  Labs reviewed in epic and pertinent values follow: CBC    Component Value Date/Time   WBC 9.2 08/02/2022 0938   WBC 6.5 02/25/2022 0149   RBC 4.86 08/02/2022 0938   RBC 3.64 (L) 02/25/2022 0149   HGB 14.2 08/02/2022 0938   HCT 43.7 08/02/2022 0938   PLT 326 08/02/2022 0938   MCV 90 08/02/2022 0938   MCH 29.2 08/02/2022 0938   MCH 26.9 02/25/2022 0149   MCHC 32.5 08/02/2022 0938   MCHC 31.0 02/25/2022 0149   RDW 15.8 (H) 08/02/2022 0938   LYMPHSABS 1.1 08/02/2022 0938   MONOABS 0.6 01/29/2022 1516   EOSABS 0.2 08/02/2022 0938   BASOSABS 0.1 08/02/2022 0938    CMP     Component Value Date/Time   NA 138 03/07/2022 1548   K 4.1 03/07/2022 1548   CL 96 03/07/2022 1548   CO2 21 03/07/2022 1548   GLUCOSE 229 (H) 03/07/2022 1548   GLUCOSE 117 (H) 02/25/2022 0149   BUN 5 (L) 03/07/2022 1548   CREATININE 0.81 03/07/2022 1548   CALCIUM 9.2 03/07/2022 1548   PROT 6.3 03/07/2022 1548   ALBUMIN 4.1 03/07/2022 1548   AST 20 03/07/2022 1548   ALT 20 03/07/2022 1548   ALKPHOS 96 03/07/2022 1548   BILITOT <0.2 03/07/2022 1548   GFRNONAA >60 02/25/2022 0149   GFRAA >60 10/09/2019 0729     Assessment: 29 man with past history of TIAs bilateral carotid stenosis status post revascularizations, AAA, tobacco abuse, peripheral vascular disease, diabetes presented to the emergency room for sudden onset of right-sided weakness and aphasia.  On my examination, he has significant right-sided weakness, possible right-sided neglect and aphasia. Wife was at bedside-risks benefits and alternatives of case discussed.  She agreed to proceed. CT imaging reviewed prior to TNK administration Significant delay in administration of TNK due to his blood pressures being excessively high with systolic in the 200s.  This required multiple doses of antihypertensives and starting of Cleviprex drip prior to administration of TNKase.  He also had to be given a dose of Ativan for his agitation  Impression Left hemispheric stroke-etiology under investigation Status post IV thrombolysis given at [facility] emergency department via telestroke  Recommendations:  Admit to the stroke/neurology service at [facility]. He will be seen in person by the on-call neurologist and evaluated again. Telemetry Frequent neurochecks and vital checks per the post TNKase protocol. No antiplatelets or anticoagulants for the next 24 hours MRI of the brain at 24-hour mark from the [facility] 2D echo A1c Lipid panel Gentle hydration Doc senna for bowel Check BMP and replenish electrolytes as  necessary. PT OT speech therapy N.p.o. until cleared by bedside swallow evaluation or formal swallow evaluation.  FULL CODE  Plan discussed in detail with the patient's wife and son at bedside. Plan also discussed with Dr. Eber Hong,  ED provider at [facility] emergency department. Neurology team at [facility] made aware of him getting TNK and being expected in the neuro ICU-they will evaluate upon arrival.  Risks benefits and alternatives of IV TNKase discussed CT head personally reviewed prior to TNK administration-no evidence of bleed  TNK 1831 Delay due to BP management- required Labetalol and uptitration of Cleviprex before goal BP achieved.  -- Milon Dikes, MD Neurologist [facility] Pager: [phone]   CRITICAL CARE ATTESTATION Performed by: Milon Dikes, MD Total critical care time: 40 minutes Critical care time was exclusive of separately billable procedures and treating other patients and/or supervising APPs/Residents/Students Critical care was necessary to treat or prevent imminent or life-threatening deterioration. This patient is critically ill and at significant risk for neurological worsening and/or death and care requires constant monitoring. Critical care was time spent personally by me on the following activities: development of treatment plan with patient and/or surrogate as well as nursing, discussions with consultants, evaluation of patient's response to treatment, examination of patient, obtaining history from patient or surrogate, ordering and performing treatments and interventions, ordering and review of laboratory studies, ordering and review of radiographic studies, pulse oximetry, re-evaluation of patient's condition, participation in multidisciplinary rounds and medical decision making of high complexity in the care of this patient.

## 2022-10-12 NOTE — ED Provider Notes (Addendum)
[place] EMERGENCY DEPARTMENT AT [facility] Provider Note   CSN: [id number] Arrival date & time: 10/12/22  1735  An emergency department physician performed an initial assessment on this suspected stroke patient at 1744.  History  Chief Complaint  Patient presents with   Code Stroke    Jimmy Duran is a 67 y.o. male.  This patient is a 67 year old male presenting to the hospital with a history of prior cardiac ischemia requiring intervention back in December 2023, history of multiple transient ischemic attacks, he is a diabetic on insulin and metformin and is a heavy smoker.  Reportedly the patient was normal at 3:00, he was then found to be stumbling in the yard shortly thereafter with the inability to say words correctly, identify people that he knew, could not have a conversation and seemed to be having right arm weakness.  They brought him to the hospital, on arrival the patient is not able to answer any questions, the family are the primary historians and report that he was at the store with them just prior to 3:00 walking around and doing everything per normal.        Home Medications Prior to Admission medications   Medication Sig Start Date End Date Taking? Authorizing Provider  albuterol (PROVENTIL) (2.5 MG/3ML) 0.083% nebulizer solution Take 3 mLs (2.5 mg total) by nebulization every 6 (six) hours as needed for wheezing or shortness of breath. 09/23/22   [name], MD  albuterol (VENTOLIN HFA) 108 (90 Base) MCG/ACT inhaler Inhale 2 puffs into the lungs every 6 (six) hours as needed for wheezing or shortness of breath. 08/09/21   Donell Beers, FNP  aspirin EC 81 MG tablet Take 1 tablet (81 mg total) by mouth daily at 6 (six) AM. Swallow whole. 10/14/21   Rhyne, Ames Coupe, PA-C  azithromycin (ZITHROMAX Z-PAK) 250 MG tablet Take 2 tablets (500 mg) PO today, then 1 tablet (250 mg) PO daily x4 days. 08/20/22   [name], MD  benzonatate (TESSALON  PERLES) 100 MG capsule Take 2 capsules (200 mg total) by mouth 3 (three) times daily as needed for cough. 08/08/22   [name], MD  clopidogrel (PLAVIX) 75 MG tablet Take 1 tablet by mouth once daily 09/16/22   [name], MD  Continuous Blood Gluc Sensor (FREESTYLE LIBRE 3 SENSOR) MISC 1 each by Does not apply route every 14 (fourteen) days. Place 1 sensor on the skin every 14 days. Use to check glucose continuously 12/19/21   [name], MD  dicyclomine (BENTYL) 20 MG tablet TAKE 1 TABLET BY MOUTH 4 TIMES DAILY AS NEEDED FOR SPASMS 09/16/22   [name], MD  diphenhydrAMINE (BENADRYL) 25 MG tablet Take 25 mg by mouth daily as needed for allergies.     [provider]  famotidine (PEPCID) 20 MG tablet Take 1 tablet (20 mg total) by mouth 2 (two) times daily. 05/08/21   Gelene Mink, NP  fenofibrate (TRICOR) 145 MG tablet Take 1 tablet (145 mg total) by mouth daily. 03/11/22   Sharlene Dory, NP  Glucagon (GVOKE HYPOPEN 2-PACK) 1 MG/0.2ML SOAJ Inject 1 mg into the skin as needed (hypoglycemia). 12/11/21   [name], MD  insulin glargine (LANTUS) 100 UNIT/ML injection Inject 0.24 mLs (24 Units total) into the skin daily. 04/04/22   [name], MD  lansoprazole (PREVACID) 30 MG capsule TAKE 1 CAPSULE BY MOUTH TWICE DAILY BEFORE A MEAL 07/09/22   Rourk,  Gerrit Friends, MD  lisinopril (ZESTRIL) 20 MG tablet Take 1 tablet (20 mg total) by mouth daily. 09/17/22   [name], MD  metFORMIN (GLUCOPHAGE) 1000 MG tablet TAKE 1 TABLET BY MOUTH TWICE DAILY WITH A MEAL 09/03/22   [name], MD  metoprolol tartrate (LOPRESSOR) 25 MG tablet Take 1 tablet (25 mg total) by mouth 2 (two) times daily. 04/11/22   Sharlene Dory, NP  nitroGLYCERIN (NITROSTAT) 0.4 MG SL tablet Place 1 tablet (0.4 mg total) under the tongue every 5 (five) minutes as needed for chest pain (Do not excieed more than 3 tablets in 15 minutes.). 03/11/22 09/17/22  Sharlene Dory, NP  NOVOLOG 100 UNIT/ML  injection Inject 14 Units into the skin 3 (three) times daily with meals. 01/08/22   [name], MD  rosuvastatin (CRESTOR) 40 MG tablet Take 1 tablet by mouth once daily 10/01/22   Jonelle Sidle, MD  Semaglutide,0.25 or 0.5MG /DOS, (OZEMPIC, 0.25 OR 0.5 MG/DOSE,) 2 MG/3ML SOPN Inject 0.25 mg into the skin once a week. 08/20/22   [name], MD      Allergies    Patient has no known allergies.    Review of Systems   Review of Systems  Unable to perform ROS: Acuity of condition    Physical Exam Updated Vital Signs BP 115/70   Pulse 69   Temp 98.7 F (37.1 C) (Oral)   Resp 17   Ht 1.702 m (5' 7.01")   Wt 60.5 kg Comment: 09/17/22  SpO2 95%   BMI 20.88 kg/m  Physical Exam Vitals and nursing note reviewed.  Constitutional:      General: He is not in acute distress.    Appearance: He is well-developed.  HENT:     Head: Normocephalic and atraumatic.     Mouth/Throat:     Pharynx: No oropharyngeal exudate.  Eyes:     General: No scleral icterus.       Right eye: No discharge.        Left eye: No discharge.     Conjunctiva/sclera: Conjunctivae normal.     Pupils: Pupils are equal, round, and reactive to light.  Neck:     Thyroid: No thyromegaly.     Vascular: No JVD.  Cardiovascular:     Rate and Rhythm: Normal rate and regular rhythm.     Heart sounds: Normal heart sounds. No murmur heard.    No friction rub. No gallop.  Pulmonary:     Effort: Pulmonary effort is normal. No respiratory distress.     Breath sounds: Normal breath sounds. No wheezing or rales.  Abdominal:     General: Bowel sounds are normal. There is no distension.     Palpations: Abdomen is soft. There is no mass.     Tenderness: There is no abdominal tenderness.  Musculoskeletal:        General: No tenderness. Normal range of motion.     Cervical back: Normal range of motion and neck supple.  Lymphadenopathy:     Cervical: No cervical adenopathy.  Skin:    General: Skin is warm and  dry.     Findings: No erythema or rash.  Neurological:     Mental Status: He is alert.     Coordination: Coordination normal.     Comments: R facial droop  Psychiatric:        Behavior: Behavior normal.     ED Results / Procedures / Treatments   Labs (all labs ordered are  listed, but only abnormal results are displayed) Labs Reviewed  COMPREHENSIVE METABOLIC PANEL - Abnormal; Notable for the following components:      Result Value   Potassium 3.4 (*)    Glucose, Bld 261 (*)    All other components within normal limits  URINALYSIS, ROUTINE W REFLEX MICROSCOPIC - Abnormal; Notable for the following components:   Glucose, UA >=500 (*)    Hgb urine dipstick LARGE (*)    Ketones, ur 5 (*)    Protein, ur 30 (*)    Bacteria, UA RARE (*)    All other components within normal limits  BLOOD GAS, ARTERIAL - Abnormal; Notable for the following components:   pO2, Arterial 152 (*)    All other components within normal limits  TRIGLYCERIDES - Abnormal; Notable for the following components:   Triglycerides 270 (*)    All other components within normal limits  CBC - Abnormal; Notable for the following components:   WBC 17.0 (*)    All other components within normal limits  BASIC METABOLIC PANEL - Abnormal; Notable for the following components:   Glucose, Bld 213 (*)    All other components within normal limits  RAPID URINE DRUG SCREEN, HOSP PERFORMED - Abnormal; Notable for the following components:   Benzodiazepines POSITIVE (*)    All other components within normal limits  LIPID PANEL - Abnormal; Notable for the following components:   Triglycerides 270 (*)    HDL 34 (*)    VLDL 54 (*)    All other components within normal limits  GLUCOSE, CAPILLARY - Abnormal; Notable for the following components:   Glucose-Capillary 365 (*)    All other components within normal limits  GLUCOSE, CAPILLARY - Abnormal; Notable for the following components:   Glucose-Capillary 247 (*)    All other  components within normal limits  GLUCOSE, CAPILLARY - Abnormal; Notable for the following components:   Glucose-Capillary 232 (*)    All other components within normal limits  GLUCOSE, CAPILLARY - Abnormal; Notable for the following components:   Glucose-Capillary 193 (*)    All other components within normal limits  GLUCOSE, CAPILLARY - Abnormal; Notable for the following components:   Glucose-Capillary 207 (*)    All other components within normal limits  CBG MONITORING, ED - Abnormal; Notable for the following components:   Glucose-Capillary 233 (*)    All other components within normal limits  MRSA NEXT GEN BY PCR, NASAL  ETHANOL  PROTIME-INR  APTT  CBC  DIFFERENTIAL  MAGNESIUM  MAGNESIUM  MAGNESIUM  PHOSPHORUS  HEMOGLOBIN A1C  MAGNESIUM  PHOSPHORUS  I-STAT CHEM 8, ED    EKG None  Radiology ECHOCARDIOGRAM COMPLETE BUBBLE STUDY  Result Date: 10/13/2022    ECHOCARDIOGRAM REPORT   Patient Name:   CARLA JANICKE Date of Exam: 10/13/2022 Medical Rec #:  [id number]        Height:       67.0 in Accession #:    [id number]       Weight:       133.4 lb Date of Birth:  05-Jul-1955        BSA:          1.702 m Patient Age:    66 years         BP:           85/57 mmHg Patient Gender: M                HR:  76 bpm. Exam Location:  Inpatient Procedure: 2D Echo, Saline Contrast Bubble Study, Cardiac Doppler and Color            Doppler Indications:    stroke  History:        Patient has prior history of Echocardiogram examinations, most                 recent 02/24/2022. COPD; Risk Factors:Hypertension,                 Dyslipidemia, Current Smoker and Diabetes.  Sonographer:    Delcie Roch RDCS Referring Phys: [id number] PRAMOD S SETHI  Sonographer Comments: Echo performed with patient supine and on artificial respirator. Image acquisition challenging due to respiratory motion and Image acquisition challenging due to COPD. IMPRESSIONS  1. Left ventricular ejection fraction, by  estimation, is 55 to 60%. The left ventricle has normal function. The left ventricle has no regional wall motion abnormalities. There is moderate concentric left ventricular hypertrophy. Left ventricular diastolic parameters are consistent with Grade I diastolic dysfunction (impaired relaxation).  2. Right ventricular systolic function is normal. The right ventricular size is normal.  3. The mitral valve is normal in structure. No evidence of mitral valve regurgitation. No evidence of mitral stenosis.  4. The aortic valve was not well visualized. Aortic valve regurgitation is trivial. No aortic stenosis is present.  5. The inferior vena cava is normal in size with greater than 50% respiratory variability, suggesting right atrial pressure of 3 mmHg.  6. Agitated saline contrast bubble study was negative, with no evidence of any interatrial shunt. Comparison(s): No significant change from prior study. Prior images reviewed side by side. FINDINGS  Left Ventricle: Left ventricular ejection fraction, by estimation, is 55 to 60%. The left ventricle has normal function. The left ventricle has no regional wall motion abnormalities. The left ventricular internal cavity size was normal in size. There is  moderate concentric left ventricular hypertrophy. Left ventricular diastolic parameters are consistent with Grade I diastolic dysfunction (impaired relaxation). Right Ventricle: The right ventricular size is normal. No increase in right ventricular wall thickness. Right ventricular systolic function is normal. Left Atrium: Left atrial size was normal in size. Right Atrium: Right atrial size was normal in size. Pericardium: Trivial pericardial effusion is present. The pericardial effusion is anterior to the right ventricle. Presence of epicardial fat layer. Mitral Valve: The mitral valve is normal in structure. No evidence of mitral valve regurgitation. No evidence of mitral valve stenosis. Tricuspid Valve: The tricuspid valve  is normal in structure. Tricuspid valve regurgitation is not demonstrated. No evidence of tricuspid stenosis. Aortic Valve: The aortic valve was not well visualized. Aortic valve regurgitation is trivial. No aortic stenosis is present. Pulmonic Valve: The pulmonic valve was not well visualized. Pulmonic valve regurgitation is not visualized. No evidence of pulmonic stenosis. Aorta: The aortic root and ascending aorta are structurally normal, with no evidence of dilitation. Venous: The inferior vena cava is normal in size with greater than 50% respiratory variability, suggesting right atrial pressure of 3 mmHg. IAS/Shunts: No atrial level shunt detected by color flow Doppler. Agitated saline contrast was given intravenously to evaluate for intracardiac shunting. Agitated saline contrast bubble study was negative, with no evidence of any interatrial shunt.  LEFT VENTRICLE PLAX 2D LVIDd:         4.10 cm   Diastology LVIDs:         2.90 cm   LV e' medial:    5.44 cm/s LV  PW:         1.30 cm   LV E/e' medial:  12.5 LV IVS:        1.10 cm   LV e' lateral:   7.51 cm/s LVOT diam:     2.10 cm   LV E/e' lateral: 9.0 LV SV:         56 LV SV Index:   33 LVOT Area:     3.46 cm  RIGHT VENTRICLE             IVC RV Basal diam:  2.50 cm     IVC diam: 1.90 cm RV S prime:     11.50 cm/s TAPSE (M-mode): 1.6 cm LEFT ATRIUM             Index        RIGHT ATRIUM           Index LA diam:        3.40 cm 2.00 cm/m   RA Area:     10.00 cm LA Vol (A2C):   36.9 ml 21.68 ml/m  RA Volume:   19.60 ml  11.51 ml/m LA Vol (A4C):   26.5 ml 15.57 ml/m LA Biplane Vol: 31.6 ml 18.56 ml/m  AORTIC VALVE LVOT Vmax:   84.90 cm/s LVOT Vmean:  55.100 cm/s LVOT VTI:    0.163 m  AORTA Ao Root diam: 3.10 cm Ao Asc diam:  3.20 cm MITRAL VALVE MV Area (PHT): 3.53 cm    SHUNTS MV Decel Time: 215 msec    Systemic VTI:  0.16 m MV E velocity: 67.90 cm/s  Systemic Diam: 2.10 cm MV A velocity: 58.60 cm/s MV E/A ratio:  1.16 Riley Lam MD Electronically  signed by Riley Lam MD Signature Date/Time: 10/13/2022/11:34:21 AM    Final    EEG adult  Result Date: 10/13/2022 Charlsie Quest, MD     10/13/2022 10:16 AM Patient Name: ALGOT DENNINGTON MRN: [id number] Epilepsy Attending: Charlsie Quest Referring Physician/Provider: Mathews Argyle, NP Date: 10/13/2022 Duration: 25.08 mins Patient history: 67yo M with sudden right sided weakness getting eeg to evaluate for seizure. Level of alertness:  lethargic/sedated AEDs during EEG study: Propofol Technical aspects: This EEG study was done with scalp electrodes positioned according to the 10-20 International system of electrode placement. Electrical activity was reviewed with band pass filter of 1-70Hz , sensitivity of 7 uV/mm, display speed of 51mm/sec with a 60Hz  notched filter applied as appropriate. EEG data were recorded continuously and digitally stored.  Video monitoring was available and reviewed as appropriate. Description: EEG showed continuous generalized and lateralized left hemisphere 3 to 6 Hz theta-delta slowing with overriding 12-13hz  beta activity. Hyperventilation and photic stimulation were not performed.   ABNORMALITY - Continuous slow, generalized and lateralized left hemisphere IMPRESSION: This study is suggestive of cortical dysfunction arising from left hemisphere likely secondary to underlying structural abnormality, post-ictal state. Additionally there is severe diffuse encephalopathy likely related to sedation. No seizures or epileptiform discharges were seen throughout the recording. Charlsie Quest   DG Chest Port 1 View  Result Date: 10/12/2022 CLINICAL DATA:  Intubated EXAM: PORTABLE CHEST 1 VIEW COMPARISON:  10/12/2022 at 2042 hours FINDINGS: Endotracheal tube terminates 3 cm above the carina. Lungs are clear.  No pleural effusion or pneumothorax. The heart is normal in size. Endotracheal tube coursing into the stomach. IMPRESSION: Endotracheal tube terminates 3 cm above the  carina. Electronically Signed   By: Charline Bills M.D.   On: 10/12/2022  23:49   CT CHEST ABDOMEN PELVIS WO CONTRAST  Result Date: 10/12/2022 CLINICAL DATA:  Abdominal pain. EXAM: CT CHEST, ABDOMEN AND PELVIS WITHOUT CONTRAST TECHNIQUE: Multidetector CT imaging of the chest, abdomen and pelvis was performed following the standard protocol without IV contrast. RADIATION DOSE REDUCTION: This exam was performed according to the departmental dose-optimization program which includes automated exposure control, adjustment of the mA and/or kV according to patient size and/or use of iterative reconstruction technique. COMPARISON:  Chest CT dated 09/06/2021 and CT abdomen pelvis dated 06/23/2020. FINDINGS: Evaluation of this exam is limited in the absence of intravenous contrast. CT CHEST FINDINGS Cardiovascular: There is no cardiomegaly or pericardial effusion. There is 3 vessel coronary vascular calcification. There is moderate atherosclerotic calcification of the thoracic aorta. No aneurysmal dilatation. The central pulmonary arteries are grossly unremarkable. Mediastinum/Nodes: No hilar or mediastinal adenopathy. The esophagus is grossly unremarkable. No mediastinal fluid collection. Lungs/Pleura: Mild centrilobular emphysema. No focal consolidation, pleural effusion, or pneumothorax. Mucus secretions noted in the bilateral mainstem bronchi. The central airways remain patent. Endotracheal tube with tip approximately 2 cm above the carina. Musculoskeletal: No acute osseous pathology. CT ABDOMEN PELVIS FINDINGS No intra-abdominal free air or free fluid. Hepatobiliary: The liver is unremarkable. No biliary dilatation. Cholecystectomy. No retained calcified stone noted in the central CBD. Pancreas: Unremarkable. No pancreatic ductal dilatation or surrounding inflammatory changes. Spleen: Normal in size without focal abnormality. Adrenals/Urinary Tract: Mild bilateral adrenal thickening/hyperplasia versus adenoma.  There is a 2 cm right renal upper pole cyst. Mild bilateral hydronephrosis and hydroureter. There is bilateral perinephric stranding, new since the prior CT and nonspecific. Correlation with urinalysis recommended to exclude UTI. Excreted contrast noted in the renal collecting system and ureter. The urinary bladder is distended with excreted contrast. Stomach/Bowel: There is moderate stool throughout the colon. There is sigmoid diverticulosis without active inflammatory changes. There is no bowel obstruction or active inflammation. The appendix is not visualized with certainty. No inflammatory changes identified in the right lower quadrant. Vascular/Lymphatic: Advanced aortoiliac atherosclerotic disease. There is a 2.4 cm infrarenal aortic ectasia. There is a stent in the proximal SMA. Evaluation of the vasculature is limited in the absence of intravenous contrast. The IVC is unremarkable. No portal venous gas. There is no adenopathy. Reproductive: The prostate and seminal vesicles are grossly unremarkable. No pelvic mass. Other: None Musculoskeletal: Osteopenia with degenerative changes. No acute osseous pathology. IMPRESSION: 1. No acute intrathoracic pathology. 2. Mild bilateral hydronephrosis and hydroureter with bilateral perinephric stranding. Correlation with urinalysis recommended to exclude UTI. 3. Sigmoid diverticulosis. No bowel obstruction. 4. Aortic Atherosclerosis (ICD10-I70.0) and Emphysema (ICD10-J43.9). Electronically Signed   By: Elgie Collard M.D.   On: 10/12/2022 21:00   DG Chest Portable 1 View  Result Date: 10/12/2022 CLINICAL DATA:  Status post intubation EXAM: PORTABLE CHEST 1 VIEW COMPARISON:  Chest radiograph dated 02/23/2022 FINDINGS: Lines/tubes: Endotracheal tube tip projects 4.2 cm above the carina. Lungs: Lungs are clear without focal consolidation. Pleura: No pneumothorax or pleural effusion. Bilateral costophrenic angles are not included within the field of view.  Heart/mediastinum: The heart size and mediastinal contours are within normal limits. Bones: No acute osseous abnormality. IMPRESSION: 1. Endotracheal tube tip projects 4.2 cm above the carina. 2. No focal consolidations. Electronically Signed   By: Agustin Cree M.D.   On: 10/12/2022 20:53   CT Head Wo Contrast  Result Date: 10/12/2022 CLINICAL DATA:  Neuro deficit, acute, stroke suspected post TNK mental changes EXAM: CT HEAD WITHOUT CONTRAST TECHNIQUE: Contiguous axial images  were obtained from the base of the skull through the vertex without intravenous contrast. RADIATION DOSE REDUCTION: This exam was performed according to the departmental dose-optimization program which includes automated exposure control, adjustment of the mA and/or kV according to patient size and/or use of iterative reconstruction technique. COMPARISON:  CT head 10/12/2022, CT head 01/04/2014 FINDINGS: Brain: Patchy and confluent areas of decreased attenuation are noted throughout the deep and periventricular white matter of the cerebral hemispheres bilaterally, compatible with chronic microvascular ischemic disease. Left parietal encephalomalacia. No evidence of large-territorial acute infarction. No parenchymal hemorrhage. No mass lesion. No extra-axial collection. No mass effect or midline shift. No hydrocephalus. Basilar cisterns are patent. Vascular: No hyperdense vessel. Atherosclerotic calcifications are present within the cavernous internal carotid and vertebral arteries. Skull: No acute fracture or focal lesion. Sinuses/Orbits: Paranasal sinuses and mastoid air cells are clear. Bilateral lens replacement. Otherwise the orbits are unremarkable. Other: None. IMPRESSION: No acute intracranial abnormality. Electronically Signed   By: Tish Frederickson M.D.   On: 10/12/2022 20:35   CT ANGIO HEAD NECK W WO CM (CODE STROKE)  Result Date: 10/12/2022 CLINICAL DATA:  Code stroke EXAM: CT ANGIOGRAPHY HEAD AND NECK WITH AND WITHOUT CONTRAST  TECHNIQUE: Multidetector CT imaging of the head and neck was performed using the standard protocol during bolus administration of intravenous contrast. Multiplanar CT image reconstructions and MIPs were obtained to evaluate the vascular anatomy. Carotid stenosis measurements (when applicable) are obtained utilizing NASCET criteria, using the distal internal carotid diameter as the denominator. RADIATION DOSE REDUCTION: This exam was performed according to the departmental dose-optimization program which includes automated exposure control, adjustment of the mA and/or kV according to patient size and/or use of iterative reconstruction technique. CONTRAST:  75mL OMNIPAQUE IOHEXOL 350 MG/ML SOLN COMPARISON:  Same-day noncontrast head CT, CTA neck 09/10/2021, MRA head 01/04/2014 FINDINGS: CTA NECK FINDINGS Aortic arch: There is mild calcified plaque in the imaged aortic arch. The origins of the major branch vessels are patent. The subclavian arteries are patent to the level imaged with mild stenosis proximally on the right. Right carotid system: The right common, internal, and external carotid arteries are patent, without hemodynamically significant stenosis or occlusion. There is no evidence of dissection or aneurysm. Left carotid system: The left common carotid artery is patent with mild calcified plaque but no significant stenosis or occlusion. A stent is seen traversing the distal common carotid artery through the internal carotid artery. There is narrowing of the stent at the level of the bifurcation due to significant eccentric plaque resulting in approximately 75% stenosis, though measurement is difficult due to streak artifact from the stent. The internal carotid artery distal to the stent is widely patent. The external carotid artery is stenotic at its origin but patent. There is no evidence of dissection or aneurysm. Vertebral arteries: There is severe stenosis of the left vertebral artery at its origin. There  is moderate stenosis of the right vertebral artery at its origin. The remainder of the vertebral arteries are patent, without other hemodynamically significant stenosis or occlusion. There is no evidence of dissection or aneurysm. Skeleton: There is degenerative change most advanced at C4-C5 and C5-C6 with reversal of the normal curvature. There is no acute osseous abnormality or suspicious osseous lesion. There is no visible canal hematoma. Other neck: The soft tissues of the neck are unremarkable. Upper chest: There is mild emphysema in the lung apices. There is debris in the trachea. Review of the MIP images confirms the above findings CTA HEAD  FINDINGS Anterior circulation: There is calcified plaque in the intracranial ICAs resulting in severe stenosis of the right cavernous segment and left supraclinoid segment. The ICA termini are patent. The bilateral MCAs and ACAS are patent, without proximal stenosis or occlusion. The anterior communicating artery is normal. There is no aneurysm or AVM. Posterior circulation: There is calcified plaque in the bilateral V4 segments resulting in mild stenosis bilaterally. The basilar artery is patent with mild atherosclerotic plaque. The major cerebellar arteries appear patent. The bilateral PCAs are patent, with mild atherosclerotic irregularity but no proximal high-grade stenosis or occlusion. A small right posterior communicating artery is identified. There is no aneurysm or AVM. Venous sinuses: Patent. Anatomic variants: None. Review of the MIP images confirms the above findings IMPRESSION: 1. No emergent vascular finding. 2. Patent stent in the left common and internal carotid arteries with approximately 75% stenosis at the level of the bifurcation due to significant eccentric plaque. The internal carotid artery distal to the stent is widely patent. 3. Severe left and moderate right vertebral artery origin stenosis. 4. 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 Otherwise, patent intracranial vasculature with mild atherosclerotic irregularity but no other high-grade stenosis or occlusion. 5. Debris in the trachea. Correlate with any signs or symptoms of aspiration. 6. Mild emphysema. These results were called by telephone at the time of interpretation on 10/12/2022 at 6:27 pm to provider Dr Wilford Corner, who verbally acknowledged these results. Electronically Signed   By: Lesia Hausen M.D.   On: 10/12/2022 18:33   CT HEAD CODE STROKE WO CONTRAST  Result Date: 10/12/2022 CLINICAL DATA:  Code stroke. Neuro deficit, acute, stroke suspected. Confusion. Aphasia. Weakness in the upper extremities bilaterally. Last known well at 1530 hours. EXAM: CT HEAD WITHOUT CONTRAST TECHNIQUE: Contiguous axial images were obtained from the base of the skull through the vertex without intravenous contrast. RADIATION DOSE REDUCTION: This exam was performed according to the departmental dose-optimization program which includes automated exposure control, adjustment of the mA and/or kV according to patient size and/or use of iterative reconstruction technique. COMPARISON:  CT head contrast 01/04/2014 FINDINGS: Brain: Cortical and subcortical white matter hypoattenuation is present in the high left parietal lobe without significant volume loss. Mild generalized atrophy is present. Moderate periventricular and diffuse subcortical white matter hypoattenuation is present bilaterally. No acute other cortical infarct is present. Insular ribbon is normal bilaterally. Basal ganglia are intact. The ventricles are of normal size. No significant extraaxial fluid collection is present. The brainstem and cerebellum are within normal limits. Midline structures are within normal limits. Vascular: Atherosclerotic calcifications are present within the cavernous internal carotid arteries and at the dural margin of both vertebral arteries.  Skull: Calvarium is intact. No focal lytic or blastic lesions are present. No significant extracranial soft tissue lesion is present. Sinuses/Orbits: The paranasal sinuses and mastoid air cells are clear. The globes and orbits are within normal limits. ASPECTS [facility] Stroke Program Early CT Score) - Ganglionic level infarction (caudate, lentiform nuclei, internal capsule, insula, M1-M3 cortex): 6/7 - Supraganglionic infarction (M4-M6 cortex): 3/3 Total score (0-10 with 10 being normal): 9/10 IMPRESSION: 1. Cortical and subcortical white matter hypoattenuation in the high left parietal lobe without significant volume loss. This is concerning for an acute or subacute infarct. 2. No other acute cortical infarct. 3. Moderate periventricular and diffuse subcortical white matter hypoattenuation bilaterally. This likely reflects the sequela of chronic microvascular ischemia. 4. Aspects is 9/10.  These results were called by telephone at the time of interpretation on 10/12/2022 at 6:01 pm to provider [facility] , who verbally acknowledged these results. Electronically Signed   By: Marin Roberts M.D.   On: 10/12/2022 18:01    Procedures .Critical Care  Performed by: Eber Hong, MD Authorized by: Eber Hong, MD   Critical care provider statement:    Critical care time (minutes):  30   Critical care time was exclusive of:  Separately billable procedures and treating other patients and teaching time   Critical care was necessary to treat or prevent imminent or life-threatening deterioration of the following conditions:  CNS failure or compromise   Critical care was time spent personally by me on the following activities:  Development of treatment plan with patient or surrogate, discussions with consultants, evaluation of patient's response to treatment, examination of patient, ordering and review of laboratory studies, ordering and review of radiographic studies, ordering and performing treatments and  interventions, pulse oximetry, re-evaluation of patient's condition, review of old charts and obtaining history from patient or surrogate   I assumed direction of critical care for this patient from another provider in my specialty: no     Care discussed with: admitting provider and accepting provider at another facility   Comments:       Procedure Name: Intubation Date/Time: 10/12/2022 8:26 PM  Performed by: Eber Hong, MDPre-anesthesia Checklist: Suction available, Emergency Drugs available, Patient identified, Patient being monitored and Timeout performed Oxygen Delivery Method: Ambu bag Preoxygenation: Pre-oxygenation with 100% oxygen Induction Type: Rapid sequence Ventilation: Mask ventilation without difficulty Laryngoscope Size: 4 Grade View: Grade II Tube size: 7.5 mm Number of attempts: 1 Airway Equipment and Method: Stylet Placement Confirmation: ETT inserted through vocal cords under direct vision and Positive ETCO2 Secured at: 24 cm Tube secured with: ETT holder Dental Injury: Teeth and Oropharynx as per pre-operative assessment  Difficulty Due To: Difficulty was unanticipated Comments:          Medications Ordered in ED Medications  fentaNYL (SUBLIMAZE) 50 MCG/ML injection (has no administration in time range)  fentaNYL (SUBLIMAZE) 50 MCG/ML injection (has no administration in time range)  Chlorhexidine Gluconate Cloth 2 % PADS 6 each (has no administration in time range)  Oral care mouth rinse (15 mLs Mouth Rinse Given 10/13/22 1244)  Oral care mouth rinse (has no administration in time range)  metoprolol tartrate (LOPRESSOR) tablet 25 mg (25 mg Per Tube Not Given 10/13/22 0911)  rosuvastatin (CRESTOR) tablet 40 mg (40 mg Per Tube Given 10/13/22 0910)  famotidine (PEPCID) tablet 20 mg (20 mg Per Tube Given 10/13/22 0910)  docusate (COLACE) 50 MG/5ML liquid 100 mg (100 mg Per Tube Given 10/13/22 0910)  polyethylene glycol (MIRALAX / GLYCOLAX) packet 17 g (17 g Per  Tube Given 10/13/22 0910)  propofol (DIPRIVAN) 1000 MG/100ML infusion (30 mcg/kg/min  60.5 kg Intravenous New Bag/Given 10/13/22 0441)  fentaNYL (SUBLIMAZE) injection 50 mcg (has no administration in time range)  fentaNYL (SUBLIMAZE) injection 50-200 mcg (100 mcg Intravenous Given 10/13/22 0327)  insulin aspart (novoLOG) injection 0-9 Units (3 Units Subcutaneous Given 10/13/22 1238)  levalbuterol (XOPENEX) nebulizer solution 0.63 mg (has no administration in time range)  insulin glargine-yfgn (SEMGLEE) injection 12 Units (12 Units Subcutaneous Given 10/13/22 0919)  0.9 %  sodium chloride infusion (250 mLs Intravenous New Bag/Given 10/13/22 0151)  norepinephrine (LEVOPHED) 4mg  in (0.016 mg/mL) premix infusion (3 mcg/min Intravenous Rate/Dose Change 10/13/22 0430)  dexmedetomidine (PRECEDEX) 400 MCG/100ML (4 mcg/mL) infusion (0.4  mcg/kg/hr  60.5 kg Intravenous New Bag/Given 10/13/22 1023)  feeding supplement (PROSource TF20) liquid 60 mL (60 mLs Per Tube Given 10/13/22 1249)  feeding supplement (PIVOT 1.5 CAL) liquid 1,000 mL (1,000 mLs Per Tube New Bag/Given 10/13/22 1454)  iohexol (OMNIPAQUE) 350 MG/ML injection 75 mL (75 mLs Intravenous Contrast Given 10/12/22 1757)  tenecteplase (TNKASE) injection for Stroke 15 mg (15 mg Intravenous Given 10/12/22 1831)  labetalol (NORMODYNE) injection 20 mg (20 mg Intravenous Given 10/12/22 1815)  hydrALAZINE (APRESOLINE) injection 10 mg (10 mg Intravenous Given 10/12/22 1822)  LORazepam (ATIVAN) injection 2 mg (2 mg Intravenous Given 10/12/22 1814)  diazepam (VALIUM) injection 5 mg (5 mg Intravenous Given 10/12/22 1844)  rocuronium bromide 100 MG/10ML SOSY (100 mg  Given 10/12/22 2022)  etomidate (AMIDATE) 2 MG/ML injection (20 mg  Given 10/12/22 2020)  fentaNYL (SUBLIMAZE) injection 12.5 mcg (12.5 mcg Intravenous Given 10/12/22 2017)  fentaNYL (SUBLIMAZE) injection 12.5 mcg (12.5 mcg Intravenous Given 10/12/22 2108)  labetalol (NORMODYNE) injection 20 mg (20 mg  Intravenous Given 10/12/22 2340)  magnesium sulfate IVPB 2 g 50 mL (2 g Intravenous New Bag/Given 10/12/22 2340)  potassium chloride 10 mEq in 100 mL IVPB (10 mEq Intravenous New Bag/Given 10/13/22 0333)  lactated ringers bolus 500 mL (500 mLs Intravenous New Bag/Given 10/13/22 0105)   stroke: early stages of recovery book ( Does not apply Given 10/13/22 1227)  lactated ringers bolus 1,000 mL (1,000 mLs Intravenous New Bag/Given 10/13/22 1457)    ED Course/ Medical Decision Making/ A&P                                 Medical Decision Making Amount and/or Complexity of Data Reviewed Labs: ordered. Radiology: ordered.  Risk Prescription drug management. Decision regarding hospitalization.    This patient presents to the ED for concern of possible stroke with weakness and aphasia, this involves an extensive number of treatment options, and is a complaint that carries with it a high risk of complications and morbidity.  The differential diagnosis includes stroke, hemorrhage, hypoglycemia, infection   Co morbidities that complicate the patient evaluation  Diabetes, heavy smoker   Additional history obtained:  Additional history obtained from medical record External records from outside source obtained and reviewed including multiple prior outpatient visits, followed by Dr. Durwin Nora for his hypertension and diabetes and iron deficiency anemia,   Lab Tests:  I Ordered, and personally interpreted labs.  The pertinent results include: Alcohol undetectable, CBC unremarkable, metabolic panel with hyperglycemia at 260, minimal hypokalemia at 3.4,   Imaging Studies ordered:  I ordered imaging studies including CT angiogram of the head and neck as well as a CT scan without contrast I independently visualized and interpreted imaging which showed stent in the left carotid, stenosis present in vertebral artery on the right and left, subcortical white matter hypoattenuation in the high left  parietal lobe consistent with an acute infarct I agree with the radiologist interpretation   Cardiac Monitoring: / EKG:  The patient was maintained on a cardiac monitor.  I personally viewed and interpreted the cardiac monitored which showed an underlying rhythm of: Sinus rhythm, mild tachycardia   Consultations Obtained:  I requested consultation with the neurologist Dr. Jerrell Belfast,  and discussed lab and imaging findings as well as pertinent plan - they recommend: Aggressive control of blood pressure which was severely elevated, requiring labetalol, Cleviprex as needed, TNK   Problem List / ED Course /  Critical interventions / Medication management  As above multiple meds required for severe hypertension and hypertensive emergency with acute stroke  Reevaluation of the patient after these medicines showed that the patient slight improvement I have reviewed the patients home medicines and have made adjustments as needed   Social Determinants of Health:  Heavy smoker   Test / Admission - Considered:  Admit to neuro ICU at [facility]   The patient had equal position after receiving thrombolytic therapy, he became slightly more agitated he was requiring Cleviprex for blood pressure control, multiple doses of benzodiazepines and ultimately at the recommendation of neurology a small amount of fentanyl was given.  This caused him respiratory depression and ongoing agitation, ultimately intubation was required for stability of the patient to go to CT scan.  He will go to [facility] once CT is completed, looking for hemorrhage at this time.  Intubation successful without complications  Patient intubated successfully to facilitate CT scan given his significant agitation and risk for pulling out IVs and decompensation in the scanner.    Final Clinical Impression(s) / ED Diagnoses Final diagnoses:  Acute ischemic stroke [facility])     Eber Hong, MD 10/12/22 Margretta Ditty     Eber Hong, MD 10/13/22 1512    Eber Hong, MD 10/22/22 1734

## 2022-10-12 NOTE — H&P (Signed)
Admission H&P    Chief Complaint: Sudden onset of right-sided weakness and aphasia  HPI: Jimmy Duran is an 67 y.o. male with a past medical history of multiple TIAs, bilateral carotid revascularizations due to carotid artery stenosis bilaterally, AAA, tobacco abuse, COPD, peripheral vascular disease and DM2 who initially presented to the [facility] emergency department for evaluation of sudden onset of right-sided weakness and aphasia.  According to family he was last known well at 3:30 PM when they came back from grocery shopping and he appeared to be in his normal state of health.  He went out to the backyard and as the wife is cooking; she saw him stumbling as if he had been drinking.  He does not drink alcohol and that was unusual for him.  When the family went to check on him, his speech was not making sense and he was stumbling with weakness to the right.  Family brought him to the [facility] ER for further evaluation as they live in [place] and did not want him to be taken to [place]. Code stroke was activated.  Patient was seen via telestroke. NIHSS 14. CT head showed no hemorrhage; there was cortical and subcortical white matter hypoattenuation in the high left parietal lobe that was concerning for an acute or subacute infarct; ASPECTS was 9/10. TNK was administered after informed consent was obtained from his wife. He was intubated for severe agitation prior to transfer to [facility].   Past Medical History:  Diagnosis Date   AAA (abdominal aortic aneurysm) (HCC)    Arthritis    Carotid artery disease (HCC)    Cataract    Colon polyp    COPD (chronic obstructive pulmonary disease) (HCC)    Coronary artery disease    Nonobstructive   Essential hypertension    GERD (gastroesophageal reflux disease)    Hemorrhoids    History of TIA (transient ischemic attack)    Low back pain    Lumbar radiculopathy    Mixed hyperlipidemia    Palpitations    Peripheral vascular disease (HCC)     Shingles 09/23/2019   Stroke (HCC)    Tobacco dependence    Type 2 diabetes mellitus (HCC)     Past Surgical History:  Procedure Laterality Date   APPENDECTOMY  1970   BIOPSY  12/08/2017   Procedure: BIOPSY;  Surgeon: Corbin Ade, MD;  [facility];  Service: Endoscopy;;  esophagus    BIOPSY  12/28/2020   Procedure: BIOPSY;  Surgeon: Corbin Ade, MD;  [facility];  Service: Endoscopy;;   BIOPSY  07/12/2021   Procedure: BIOPSY;  Surgeon: Corbin Ade, MD;  [facility];  Service: Endoscopy;;   BREAST SURGERY Right    Cyst resection on the right   CARDIAC CATHETERIZATION  1990's   in [place]. no stent placement   CATARACT EXTRACTION W/PHACO Right 06/18/2016   Procedure: CATARACT EXTRACTION PHACO AND INTRAOCULAR LENS PLACEMENT (IOC);  Surgeon: Galen Manila, MD;  Location: [facility] ORS;  Service: Ophthalmology;  Laterality: Right;  [name] 35.7 AP% 16.6 CDE 5.94 Fluid pack lot # [id number]   CATARACT EXTRACTION W/PHACO Left 07/16/2016   Procedure: CATARACT EXTRACTION PHACO AND INTRAOCULAR LENS PLACEMENT (IOC);  Surgeon: Galen Manila, MD;  Location: [facility] ORS;  Service: Ophthalmology;  Laterality: Left;  [name] 51.8 AP% 12.7 CDE 6.58 Fluid Pack Lot # [id number]   CHOLECYSTECTOMY  1993   COLONOSCOPY  02/12/2007   KGM:[id number] friable anal canal hemorrhoids,  otherwise normal rectum and colon   COLONOSCOPY N/A 07/16/2012   RMR: Colonic diverticulosis. 4 mm tubular adenoma   COLONOSCOPY WITH PROPOFOL N/A 12/08/2017   Dr. Jena Gauss: sigmoid and descending colon diverticulosis, transverse colon polyp (TUBULAR ADENOMA)   COLONOSCOPY WITH PROPOFOL N/A 03/06/2022   Procedure: COLONOSCOPY WITH PROPOFOL;  Surgeon: Corbin Ade, MD;  [facility];  Service: Endoscopy;  Laterality: N/A;  12:30 PM   ENDARTERECTOMY Right 10/08/2019   Procedure: RIGHT CAROTID ENDARTERECTOMY;  Surgeon: Larina Earthly, MD;  Location: [facility];  Service: Vascular;   Laterality: Right;   ESOPHAGOGASTRODUODENOSCOPY (EGD) WITH ESOPHAGEAL DILATION N/A 07/16/2012   RMR: +Candida esophagitis, Erosive reflux esophagiits. Schatizi's ring status post dilation. Hiatal hernia. Antral erosions status post biopsy.    ESOPHAGOGASTRODUODENOSCOPY (EGD) WITH PROPOFOL N/A 12/08/2017   Dr. Jena Gauss: esophagitis, query EOE but negative for increased eosinophils on path, small hiatal hernia, normal stomach, normal duodenum, PAS stain with rare yeast forms on stain. Empiric dilation   ESOPHAGOGASTRODUODENOSCOPY (EGD) WITH PROPOFOL N/A 12/28/2020   esophagitis without bleeding, s/p biopsy and dilation. Antral erosions s/p biopsy.   ESOPHAGOGASTRODUODENOSCOPY (EGD) WITH PROPOFOL N/A 07/12/2021   Surgeon: Corbin Ade, MD;  Cobblestoning appearance of distal esophagus, slight narrowing of distal esophagus s/p dilation with 56 Fr and biopsy, gastric erosions biopsied, normal examined duodenum.  Esophageal biopsy with reactive squamous mucosa, gastric biopsy with reactive gastropathy, negative for H. pylori.   ESOPHAGOGASTRODUODENOSCOPY (EGD) WITH PROPOFOL N/A 03/06/2022   Procedure: ESOPHAGOGASTRODUODENOSCOPY (EGD) WITH PROPOFOL;  Surgeon: Corbin Ade, MD;  [facility];  Service: Endoscopy;  Laterality: N/A;   EYE SURGERY Bilateral 2018   cataract   GIVENS CAPSULE STUDY N/A 03/13/2022   Procedure: GIVENS CAPSULE STUDY;  Surgeon: Corbin Ade, MD;  [facility];  Service: Endoscopy;  Laterality: N/A;  7:30am   HERNIA REPAIR     JOINT REPLACEMENT     KNEE SURGERY     Left knee arthroscopy torn medial meniscus grade 4 chondral changes medial femoral condyle tibial plateau   MALONEY DILATION N/A 12/08/2017   Procedure: MALONEY DILATION;  Surgeon: Corbin Ade, MD;  [facility];  Service: Endoscopy;  Laterality: N/A;   MALONEY DILATION N/A 12/28/2020   Procedure: [name] DILATION;  Surgeon: Corbin Ade, MD;  [facility];   Service: Endoscopy;  Laterality: N/A;   MALONEY DILATION N/A 07/12/2021   Procedure: [name] DILATION;  Surgeon: Corbin Ade, MD;  [facility];  Service: Endoscopy;  Laterality: N/A;   PATCH ANGIOPLASTY Right 10/08/2019   Procedure: PATCH ANGIOPLASTY of the right common carotid artery using hemashield plaltinum finesse patch;  Surgeon: Larina Earthly, MD;  [facility];  Service: Vascular;  Laterality: Right;   PERIPHERAL VASCULAR INTERVENTION  07/17/2020   Procedure: PERIPHERAL VASCULAR INTERVENTION;  Surgeon: Maeola Harman, MD;  Location: [facility] INVASIVE CV LAB;  Service: Cardiovascular;;   POLYPECTOMY  12/08/2017   Procedure: POLYPECTOMY;  Surgeon: Corbin Ade, MD;  [facility];  Service: Endoscopy;;  colon   POLYPECTOMY  03/06/2022   Procedure: POLYPECTOMY;  Surgeon: Corbin Ade, MD;  [facility];  Service: Endoscopy;;   TOTAL KNEE ARTHROPLASTY Left 04/27/2012   Procedure: TOTAL KNEE ARTHROPLASTY;  Surgeon: Vickki Hearing, MD;  Location: [facility];  Service: Orthopedics;  Laterality: Left;   TRANSCAROTID ARTERY REVASCULARIZATION  Left 10/12/2021   Procedure: Left Transcarotid Artery Revascularization;  Surgeon: Randie Heinz,  Dennard Schaumann, MD;  Location: [facility] OR;  Service: Vascular;  Laterality: Left;   UMBILICAL HERNIA REPAIR  1993   VISCERAL ANGIOGRAPHY N/A 07/17/2020   Procedure: mesenteric ANGIOGRAPHY;  Surgeon: Maeola Harman, MD;  Location: [facility] INVASIVE CV LAB;  Service: Cardiovascular;  Laterality: N/A;    Family History  Problem Relation Age of Onset   Dementia Mother    Hypertension Mother    Colon polyps Mother    Osteoporosis Mother    Cancer Father    Hypertension Father    Coronary artery disease Father        CABG in his 34's   Heart attack Father    Cancer - Lung Father    Heart disease Brother        has a pacemaker   Emphysema Maternal Grandmother    Cancer Maternal Grandmother    Cancer - Lung  Maternal Grandfather    Diabetes Maternal Grandfather    Cancer Paternal Grandmother    Heart attack Paternal Grandfather    Colon cancer Other        maternal great aunt   Colon cancer Other        paternal great aunt   Social History:  reports that he has been smoking cigarettes. He has a 15 pack-year smoking history. He quit smokeless tobacco use about 4 years ago.  His smokeless tobacco use included chew. He reports that he does not currently use alcohol. He reports that he does not use drugs.  Allergies: No Known Allergies  Medications Prior to Admission  Medication Sig Dispense Refill   albuterol (PROVENTIL) (2.5 MG/3ML) 0.083% nebulizer solution Take 3 mLs (2.5 mg total) by nebulization every 6 (six) hours as needed for wheezing or shortness of breath. 150 mL 1   albuterol (VENTOLIN HFA) 108 (90 Base) MCG/ACT inhaler Inhale 2 puffs into the lungs every 6 (six) hours as needed for wheezing or shortness of breath. 8 g 2   aspirin EC 81 MG tablet Take 1 tablet (81 mg total) by mouth daily at 6 (six) AM. Swallow whole. 30 tablet 12   azithromycin (ZITHROMAX Z-PAK) 250 MG tablet Take 2 tablets (500 mg) PO today, then 1 tablet (250 mg) PO daily x4 days. 6 tablet 0   benzonatate (TESSALON PERLES) 100 MG capsule Take 2 capsules (200 mg total) by mouth 3 (three) times daily as needed for cough. 30 capsule 1   clopidogrel (PLAVIX) 75 MG tablet Take 1 tablet by mouth once daily 30 tablet 0   Continuous Blood Gluc Sensor (FREESTYLE LIBRE 3 SENSOR) MISC 1 each by Does not apply route every 14 (fourteen) days. Place 1 sensor on the skin every 14 days. Use to check glucose continuously 2 each 2   dicyclomine (BENTYL) 20 MG tablet TAKE 1 TABLET BY MOUTH 4 TIMES DAILY AS NEEDED FOR SPASMS 30 tablet 0   diphenhydrAMINE (BENADRYL) 25 MG tablet Take 25 mg by mouth daily as needed for allergies.      famotidine (PEPCID) 20 MG tablet Take 1 tablet (20 mg total) by mouth 2 (two) times daily. 60 tablet 3    fenofibrate (TRICOR) 145 MG tablet Take 1 tablet (145 mg total) by mouth daily. 90 tablet 3   Glucagon (GVOKE HYPOPEN 2-PACK) 1 MG/0.2ML SOAJ Inject 1 mg into the skin as needed (hypoglycemia). 0.2 mL 1   insulin glargine (LANTUS) 100 UNIT/ML injection Inject 0.24 mLs (24 Units total) into the skin daily. 10 mL PRN   lansoprazole (  PREVACID) 30 MG capsule TAKE 1 CAPSULE BY MOUTH TWICE DAILY BEFORE A MEAL 60 capsule 11   lisinopril (ZESTRIL) 20 MG tablet Take 1 tablet (20 mg total) by mouth daily. 90 tablet 3   metFORMIN (GLUCOPHAGE) 1000 MG tablet TAKE 1 TABLET BY MOUTH TWICE DAILY WITH A MEAL 60 tablet 0   metoprolol tartrate (LOPRESSOR) 25 MG tablet Take 1 tablet (25 mg total) by mouth 2 (two) times daily. 60 tablet 6   nitroGLYCERIN (NITROSTAT) 0.4 MG SL tablet Place 1 tablet (0.4 mg total) under the tongue every 5 (five) minutes as needed for chest pain (Do not excieed more than 3 tablets in 15 minutes.). 25 tablet 3   NOVOLOG 100 UNIT/ML injection Inject 14 Units into the skin 3 (three) times daily with meals. 10 mL 11   rosuvastatin (CRESTOR) 40 MG tablet Take 1 tablet by mouth once daily 90 tablet 0   Semaglutide,0.25 or 0.5MG /DOS, (OZEMPIC, 0.25 OR 0.5 MG/DOSE,) 2 MG/3ML SOPN Inject 0.25 mg into the skin once a week. 3 mL 0    ROS: Unable to obtain due to sedation.    Physical Examination: Blood pressure 108/77, pulse (!) 114, temperature (S) (!) 96.6 F (35.9 C), temperature source Axillary, resp. rate 20, height 5\' 7"  (1.702 m), weight 60.5 kg, SpO2 95%.  Physical Exam  HEENT-  [place]/AT. Diaphoretic with cool, clammy skin to forehead, scalp and face.     Lungs- Intubated Extremities- No edema   Neurological Examination Mental Status: Intubated and sedated on propofol at a rate of 30. Cranial Nerves: II: Pupils are small, round and sluggishly reactive. No blink to threat.  III,IV, VI: Eyes are near the midline and conjugate. No nystagmus. No doll's eye reflex in the context of  sedation.   V: Weak corneal reflexes bilaterally VII: Face is symmetric at rest VIII: No response to voice IX,X: Intubated XI: Head is rotated to the right. No nuchal rigidity.  XII: Unable to assess Motor/Sensory: Flaccid tone x 4 in the context of propofol sedation. Minimal movement to noxious but unable to definitively localize due to sedation.  Deep Tendon Reflexes: Trace reflexes x 4 in the context of sedation Cerebellar/Gait: Unable to assess  Results for orders placed or performed during the hospital encounter of 10/12/22 (from the past 48 hour(s))  CBG monitoring, ED     Status: Abnormal   Collection Time: 10/12/22  5:52 PM  Result Value Ref Range   Glucose-Capillary 233 (H) 70 - 99 mg/dL    Comment: Glucose reference range applies only to samples taken after fasting for at least 8 hours.  Ethanol     Status: None   Collection Time: 10/12/22  6:02 PM  Result Value Ref Range   Alcohol, Ethyl (B) <10 <10 mg/dL    Comment: (NOTE) Lowest detectable limit for serum alcohol is 10 mg/dL.  For medical purposes only. Performed at [facility], [address], [place], [place] [address]   Protime-INR     Status: None   Collection Time: 10/12/22  6:02 PM  Result Value Ref Range   Prothrombin Time 13.3 11.4 - 15.2 seconds   INR 1.0 0.8 - 1.2    Comment: (NOTE) INR goal varies based on device and disease states. Performed at [facility], [address] [place], [place] [address]   APTT     Status: None   Collection Time: 10/12/22  6:02 PM  Result Value Ref Range   aPTT 27 24 - 36 seconds  Comment: Performed at [facility], [address] [place], [place] [address]  CBC     Status: None   Collection Time: 10/12/22  6:02 PM  Result Value Ref Range   WBC 6.7 4.0 - 10.5 K/uL   RBC 4.65 4.22 - 5.81 MIL/uL   Hemoglobin 14.3 13.0 - 17.0 g/dL   HCT 19.1 47.8 - 29.5 %   MCV 89.2 80.0 - 100.0 fL   MCH 30.8 26.0 - 34.0 pg   MCHC 34.5 30.0 - 36.0 g/dL   RDW 62.1 30.8 - 65.7 %    Platelets 315 150 - 400 K/uL   nRBC 0.0 0.0 - 0.2 %    Comment: Performed at [facility], [address], [place], [place] [address]  Differential     Status: None   Collection Time: 10/12/22  6:02 PM  Result Value Ref Range   Neutrophils Relative % 55 %   Neutro Abs 3.8 1.7 - 7.7 K/uL   Lymphocytes Relative 28 %   Lymphs Abs 1.9 0.7 - 4.0 K/uL   Monocytes Relative 10 %   Monocytes Absolute 0.7 0.1 - 1.0 K/uL   Eosinophils Relative 5 %   Eosinophils Absolute 0.3 0.0 - 0.5 K/uL   Basophils Relative 2 %   Basophils Absolute 0.1 0.0 - 0.1 K/uL   Immature Granulocytes 0 %   Abs Immature Granulocytes 0.01 0.00 - 0.07 K/uL    Comment: Performed at [facility], [address] [place], [place] [address]  Comprehensive metabolic panel     Status: Abnormal   Collection Time: 10/12/22  6:02 PM  Result Value Ref Range   Sodium 137 135 - 145 mmol/L   Potassium 3.4 (L) 3.5 - 5.1 mmol/L   Chloride 99 98 - 111 mmol/L   CO2 25 22 - 32 mmol/L   Glucose, Bld 261 (H) 70 - 99 mg/dL    Comment: Glucose reference range applies only to samples taken after fasting for at least 8 hours.   BUN 9 8 - 23 mg/dL   Creatinine, Ser 4.13 0.61 - 1.24 mg/dL   Calcium 9.3 8.9 - 24.4 mg/dL   Total Protein 7.0 6.5 - 8.1 g/dL   Albumin 4.2 3.5 - 5.0 g/dL   AST 17 15 - 41 U/L   ALT 11 0 - 44 U/L   Alkaline Phosphatase 44 38 - 126 U/L   Total Bilirubin 0.5 0.3 - 1.2 mg/dL   GFR, Estimated >01 >02 mL/min    Comment: (NOTE) Calculated using the CKD-EPI Creatinine Equation (2021)    Anion gap 13 5 - 15    Comment: Performed at [facility], [address], [place], [place] [address]   CT CHEST ABDOMEN PELVIS WO CONTRAST  Result Date: 10/12/2022 CLINICAL DATA:  Abdominal pain. EXAM: CT CHEST, ABDOMEN AND PELVIS WITHOUT CONTRAST TECHNIQUE: Multidetector CT imaging of the chest, abdomen and pelvis was performed following the standard protocol without IV contrast. RADIATION DOSE REDUCTION: This exam was performed  according to the departmental dose-optimization program which includes automated exposure control, adjustment of the mA and/or kV according to patient size and/or use of iterative reconstruction technique. COMPARISON:  Chest CT dated 09/06/2021 and CT abdomen pelvis dated 06/23/2020. FINDINGS: Evaluation of this exam is limited in the absence of intravenous contrast. CT CHEST FINDINGS Cardiovascular: There is no cardiomegaly or pericardial effusion. There is 3 vessel coronary vascular calcification. There is moderate atherosclerotic calcification of the thoracic aorta. No aneurysmal dilatation. The central pulmonary arteries are  grossly unremarkable. Mediastinum/Nodes: No hilar or mediastinal adenopathy. The esophagus is grossly unremarkable. No mediastinal fluid collection. Lungs/Pleura: Mild centrilobular emphysema. No focal consolidation, pleural effusion, or pneumothorax. Mucus secretions noted in the bilateral mainstem bronchi. The central airways remain patent. Endotracheal tube with tip approximately 2 cm above the carina. Musculoskeletal: No acute osseous pathology. CT ABDOMEN PELVIS FINDINGS No intra-abdominal free air or free fluid. Hepatobiliary: The liver is unremarkable. No biliary dilatation. Cholecystectomy. No retained calcified stone noted in the central CBD. Pancreas: Unremarkable. No pancreatic ductal dilatation or surrounding inflammatory changes. Spleen: Normal in size without focal abnormality. Adrenals/Urinary Tract: Mild bilateral adrenal thickening/hyperplasia versus adenoma. There is a 2 cm right renal upper pole cyst. Mild bilateral hydronephrosis and hydroureter. There is bilateral perinephric stranding, new since the prior CT and nonspecific. Correlation with urinalysis recommended to exclude UTI. Excreted contrast noted in the renal collecting system and ureter. The urinary bladder is distended with excreted contrast. Stomach/Bowel: There is moderate stool throughout the colon. There is  sigmoid diverticulosis without active inflammatory changes. There is no bowel obstruction or active inflammation. The appendix is not visualized with certainty. No inflammatory changes identified in the right lower quadrant. Vascular/Lymphatic: Advanced aortoiliac atherosclerotic disease. There is a 2.4 cm infrarenal aortic ectasia. There is a stent in the proximal SMA. Evaluation of the vasculature is limited in the absence of intravenous contrast. The IVC is unremarkable. No portal venous gas. There is no adenopathy. Reproductive: The prostate and seminal vesicles are grossly unremarkable. No pelvic mass. Other: None Musculoskeletal: Osteopenia with degenerative changes. No acute osseous pathology. IMPRESSION: 1. No acute intrathoracic pathology. 2. Mild bilateral hydronephrosis and hydroureter with bilateral perinephric stranding. Correlation with urinalysis recommended to exclude UTI. 3. Sigmoid diverticulosis. No bowel obstruction. 4. Aortic Atherosclerosis (ICD10-I70.0) and Emphysema (ICD10-J43.9). Electronically Signed   By: Elgie Collard M.D.   On: 10/12/2022 21:00   DG Chest Portable 1 View  Result Date: 10/12/2022 CLINICAL DATA:  Status post intubation EXAM: PORTABLE CHEST 1 VIEW COMPARISON:  Chest radiograph dated 02/23/2022 FINDINGS: Lines/tubes: Endotracheal tube tip projects 4.2 cm above the carina. Lungs: Lungs are clear without focal consolidation. Pleura: No pneumothorax or pleural effusion. Bilateral costophrenic angles are not included within the field of view. Heart/mediastinum: The heart size and mediastinal contours are within normal limits. Bones: No acute osseous abnormality. IMPRESSION: 1. Endotracheal tube tip projects 4.2 cm above the carina. 2. No focal consolidations. Electronically Signed   By: Agustin Cree M.D.   On: 10/12/2022 20:53   CT Head Wo Contrast  Result Date: 10/12/2022 CLINICAL DATA:  Neuro deficit, acute, stroke suspected post TNK mental changes EXAM: CT HEAD  WITHOUT CONTRAST TECHNIQUE: Contiguous axial images were obtained from the base of the skull through the vertex without intravenous contrast. RADIATION DOSE REDUCTION: This exam was performed according to the departmental dose-optimization program which includes automated exposure control, adjustment of the mA and/or kV according to patient size and/or use of iterative reconstruction technique. COMPARISON:  CT head 10/12/2022, CT head 01/04/2014 FINDINGS: Brain: Patchy and confluent areas of decreased attenuation are noted throughout the deep and periventricular white matter of the cerebral hemispheres bilaterally, compatible with chronic microvascular ischemic disease. Left parietal encephalomalacia. No evidence of large-territorial acute infarction. No parenchymal hemorrhage. No mass lesion. No extra-axial collection. No mass effect or midline shift. No hydrocephalus. Basilar cisterns are patent. Vascular: No hyperdense vessel. Atherosclerotic calcifications are present within the cavernous internal carotid and vertebral arteries. Skull: No acute fracture or focal lesion. Sinuses/Orbits:  Paranasal sinuses and mastoid air cells are clear. Bilateral lens replacement. Otherwise the orbits are unremarkable. Other: None. IMPRESSION: No acute intracranial abnormality. Electronically Signed   By: Tish Frederickson M.D.   On: 10/12/2022 20:35   CT ANGIO HEAD NECK W WO CM (CODE STROKE)  Result Date: 10/12/2022 CLINICAL DATA:  Code stroke EXAM: CT ANGIOGRAPHY HEAD AND NECK WITH AND WITHOUT CONTRAST TECHNIQUE: Multidetector CT imaging of the head and neck was performed using the standard protocol during bolus administration of intravenous contrast. Multiplanar CT image reconstructions and MIPs were obtained to evaluate the vascular anatomy. Carotid stenosis measurements (when applicable) are obtained utilizing NASCET criteria, using the distal internal carotid diameter as the denominator. RADIATION DOSE REDUCTION: This  exam was performed according to the departmental dose-optimization program which includes automated exposure control, adjustment of the mA and/or kV according to patient size and/or use of iterative reconstruction technique. CONTRAST:  75mL OMNIPAQUE IOHEXOL 350 MG/ML SOLN COMPARISON:  Same-day noncontrast head CT, CTA neck 09/10/2021, MRA head 01/04/2014 FINDINGS: CTA NECK FINDINGS Aortic arch: There is mild calcified plaque in the imaged aortic arch. The origins of the major branch vessels are patent. The subclavian arteries are patent to the level imaged with mild stenosis proximally on the right. Right carotid system: The right common, internal, and external carotid arteries are patent, without hemodynamically significant stenosis or occlusion. There is no evidence of dissection or aneurysm. Left carotid system: The left common carotid artery is patent with mild calcified plaque but no significant stenosis or occlusion. A stent is seen traversing the distal common carotid artery through the internal carotid artery. There is narrowing of the stent at the level of the bifurcation due to significant eccentric plaque resulting in approximately 75% stenosis, though measurement is difficult due to streak artifact from the stent. The internal carotid artery distal to the stent is widely patent. The external carotid artery is stenotic at its origin but patent. There is no evidence of dissection or aneurysm. Vertebral arteries: There is severe stenosis of the left vertebral artery at its origin. There is moderate stenosis of the right vertebral artery at its origin. The remainder of the vertebral arteries are patent, without other hemodynamically significant stenosis or occlusion. There is no evidence of dissection or aneurysm. Skeleton: There is degenerative change most advanced at C4-C5 and C5-C6 with reversal of the normal curvature. There is no acute osseous abnormality or suspicious osseous lesion. There is no  visible canal hematoma. Other neck: The soft tissues of the neck are unremarkable. Upper chest: There is mild emphysema in the lung apices. There is debris in the trachea. Review of the MIP images confirms the above findings CTA HEAD FINDINGS Anterior circulation: There is calcified plaque in the intracranial ICAs resulting in severe stenosis of the right cavernous segment and left supraclinoid segment. The ICA termini are patent. The bilateral MCAs and ACAS are patent, without proximal stenosis or occlusion. The anterior communicating artery is normal. There is no aneurysm or AVM. Posterior circulation: There is calcified plaque in the bilateral V4 segments resulting in mild stenosis bilaterally. The basilar artery is patent with mild atherosclerotic plaque. The major cerebellar arteries appear patent. The bilateral PCAs are patent, with mild atherosclerotic irregularity but no proximal high-grade stenosis or occlusion. A small right posterior communicating artery is identified. There is no aneurysm or AVM. Venous sinuses: Patent. Anatomic variants: None. Review of the MIP images confirms the above findings IMPRESSION: 1. No emergent vascular finding. 2. Patent stent in  the left common and internal carotid arteries with approximately 75% stenosis at the level of the bifurcation due to significant eccentric plaque. The internal carotid artery distal to the stent is widely patent. 3. Severe left and moderate right vertebral artery origin stenosis. 4. Calcified plaque in the intracranial ICAs resulting in severe stenosis of the right cavernous segment and left supraclinoid segment. Otherwise, patent intracranial vasculature with mild atherosclerotic irregularity but no other high-grade stenosis or occlusion. 5. Debris in the trachea. Correlate with any signs or symptoms of aspiration. 6. Mild emphysema. These results were called by telephone at the time of interpretation on 10/12/2022 at 6:27 pm to provider Dr Wilford Corner,  who verbally acknowledged these results. Electronically Signed   By: Lesia Hausen M.D.   On: 10/12/2022 18:33   CT HEAD CODE STROKE WO CONTRAST  Result Date: 10/12/2022 CLINICAL DATA:  Code stroke. Neuro deficit, acute, stroke suspected. Confusion. Aphasia. Weakness in the upper extremities bilaterally. Last known well at 1530 hours. EXAM: CT HEAD WITHOUT CONTRAST TECHNIQUE: Contiguous axial images were obtained from the base of the skull through the vertex without intravenous contrast. RADIATION DOSE REDUCTION: This exam was performed according to the departmental dose-optimization program which includes automated exposure control, adjustment of the mA and/or kV according to patient size and/or use of iterative reconstruction technique. COMPARISON:  CT head contrast 01/04/2014 FINDINGS: Brain: Cortical and subcortical white matter hypoattenuation is present in the high left parietal lobe without significant volume loss. Mild generalized atrophy is present. Moderate periventricular and diffuse subcortical white matter hypoattenuation is present bilaterally. No acute other cortical infarct is present. Insular ribbon is normal bilaterally. Basal ganglia are intact. The ventricles are of normal size. No significant extraaxial fluid collection is present. The brainstem and cerebellum are within normal limits. Midline structures are within normal limits. Vascular: Atherosclerotic calcifications are present within the cavernous internal carotid arteries and at the dural margin of both vertebral arteries. Skull: Calvarium is intact. No focal lytic or blastic lesions are present. No significant extracranial soft tissue lesion is present. Sinuses/Orbits: The paranasal sinuses and mastoid air cells are clear. The globes and orbits are within normal limits. ASPECTS [facility] Stroke Program Early CT Score) - Ganglionic level infarction (caudate, lentiform nuclei, internal capsule, insula, M1-M3 cortex): 6/7 - Supraganglionic  infarction (M4-M6 cortex): 3/3 Total score (0-10 with 10 being normal): 9/10 IMPRESSION: 1. Cortical and subcortical white matter hypoattenuation in the high left parietal lobe without significant volume loss. This is concerning for an acute or subacute infarct. 2. No other acute cortical infarct. 3. Moderate periventricular and diffuse subcortical white matter hypoattenuation bilaterally. This likely reflects the sequela of chronic microvascular ischemia. 4. Aspects is 9/10. These results were called by telephone at the time of interpretation on 10/12/2022 at 6:01 pm to provider [facility] , who verbally acknowledged these results. Electronically Signed   By: Marin Roberts M.D.   On: 10/12/2022 18:01    TTE from 02/24/22: 1. Left ventricular ejection fraction, by estimation, is 55 to 60%. The  left ventricle has normal function. The left ventricle has no regional  wall motion abnormalities. Left ventricular diastolic parameters are  indeterminate.   2. Right ventricular systolic function is normal. The right ventricular  size is normal. Tricuspid regurgitation signal is inadequate for assessing  PA pressure.   3. The mitral valve is grossly normal. Trivial mitral valve  regurgitation. No evidence of mitral stenosis.   4. The aortic valve is tricuspid. Aortic valve regurgitation is mild.  No  aortic stenosis is present.   5. The inferior vena cava is normal in size with greater than 50%  respiratory variability, suggesting right atrial pressure of 3 mmHg.    Assessment: 67 year old male with a past history of TIAs bilateral carotid stenosis status post revascularizations, AAA, tobacco abuse, peripheral vascular disease and diabetes who presented to the [facility] ED for sudden onset of right-sided weakness and aphasia. On examination by Teleneurology, he had significant right-sided weakness, possible right-sided neglect and aphasia. Wife was at bedside and risks/benefits and alternatives to  TNK administration were discussed.  She agreed to proceed. There was significant delay in administration of TNK due to his blood pressures being excessively high with systolics in the 200s.  This required multiple doses of antihypertensives and starting of Cleviprex drip prior to administration of TNKase.  He also had to be given a dose of Ativan for his agitation. He subsequently was intubated due to worsening agitation.  - Exam after arrival to [facility] intubated and sedated on propofol reveals reactive pupils, intact corneal reflexes and no responses to commands in the setting of sedation. Muscle tone is decreased x 4 with minimal movement to noxious stimuli.  - CT head at [facility]: Cortical and subcortical white matter hypoattenuation in the high left parietal lobe without significant volume loss. This is concerning for an acute or subacute infarct. Moderate periventricular and diffuse subcortical white matter hypoattenuation bilaterally, which likely reflects the sequela of chronic microvascular ischemia. Aspects is 9/10. - CTA of head and neck: No emergent vascular finding. Patent stent in the left common and internal carotid arteries with approximately 75% stenosis at the level of the bifurcation due to significant eccentric plaque. The internal carotid artery distal to the stent is widely patent. Severe left and moderate right vertebral artery origin stenosis. Calcified plaque in the intracranial ICAs resulting in severe stenosis of the right cavernous segment and left supraclinoid segment. Otherwise, patent intracranial vasculature with mild atherosclerotic irregularity but no other high-grade stenosis or occlusion. - Noted after arrival to [facility] to be in SVT intermittently with rate 150-160's .  - Repeat CT head at [facility]: No acute intracranial abnormality.   Plan: - Admitting to 4N ICU under the Neurology service - CCM has been consulted for ventilator management and assessment/management of the  patient's sinus tachycardia. - On propofol gtt, but still somewhat agitated. Will administer 12.5 mcg IV fentanyl x 1 now.  - Post-TNK order set to include frequent neuro checks and BP management.  - No antiplatelet medications or anticoagulants for at least 24 hours following TNK.  - DVT prophylaxis with SCDs.  - Continue rosuvastatin.  - TTE.  - MRI brain  - PT/OT/Speech after extubation - NPO until passes swallow evaluation.  - Sliding scale insulin.  - Telemetry monitoring - Fasting lipid panel, HgbA1c  40 minutes spent in the neurological evaluation and management of this critically ill patient  Electronically signed: Dr. Caryl Pina 10/12/2022, 11:06 PM

## 2022-10-12 NOTE — Consult Note (Addendum)
NAME:  Jimmy Duran, MRN:  [id number], DOB:  Jul 01, 1955, LOS: 0 ADMISSION DATE:  10/12/2022, CONSULTATION DATE:  10/12/2022  REFERRING MD:  Otelia Limes, Neuro, CHIEF COMPLAINT:  stroke, intubated   History of Present Illness:  67 year old man BIBEMS to [name], ED as a code stroke LKW 3:30 PM presented with abnormal speech and weakness on the right Head CT was negative, seen by telestroke, TNK was administered 1831 slight delay due to hypertension requiring labetalol and Cleviprex drip He was intubated for psychomotor agitation and transferred to [facility].  He was placed on propofol and Cleviprex drips Post TNK head CT was negative CT chest/abdomen/pelvis showed mild bilateral hydronephrosis and hydroureter PCCM asked to assist with management of ventilator, blood pressure and SVT.    Pertinent  Medical History  Type 2 diabetes Hypertension TIAs Bilateral carotid artery stenosis PAD AAA  Significant Hospital Events: Including procedures, antibiotic start and stop dates in addition to other pertinent events     Interim History / Subjective:  Noted to be tachycardic, sinus on monitor to 140s on arrival  Objective   Blood pressure 108/77, pulse (!) 114, temperature (S) (!) 96.6 F (35.9 C), temperature source Axillary, resp. rate 20, height 5\' 7"  (1.702 m), weight 60.5 kg, SpO2 95%.       No intake or output data in the 24 hours ending 10/12/22 2317 Filed Weights   10/12/22 1800  Weight: 60.5 kg    Examination: General: Elderly man, intubated, sedated on propofol, intermittent coughing HENT: No pallor, icterus, no JVD Lungs: Bilateral ventilated breath sounds, mild secretions from ET tube, no rhonchi Cardiovascular: S1-S2 tacky, regular Abdomen: Soft, nontender, scaphoid Extremities: No deformity, edema Neuro: Sedate, RASS -2, does not follow commands   Chest x-ray shows ET tube in position, no infiltrates Labs show mild hypokalemia, hyperglycemia, no  leukocytosis EKG shows normal sinus rhythm, prolonged QT C  Resolved Hospital Problem list     Assessment & Plan:  Acute respiratory insufficiency post CVA Intubated for psychomotor agitation to facilitate imaging Possible underlying COPD, no PFTs available  -Vent settings reviewed and adjusted, switch to [facility]. -Using propofol with intermittent fentanyl for sedation, goal RASS 0 to -1 -Plan for spontaneous breathing trials with goal extubation in a.m. -Hold off bronchodilators given tachycardia   Hypertension SVT  -On Cleviprex currently, will switch to labetalol as needed , goal systolic blood pressure less than 180 And resume home metoprolol 25 twice daily, hopefully with the sedation can come off Cleviprex  Acute CVA -status post TNK -Per neurology MRI planned -Aspirin to start after 24 hours, holding Plavix -Echo planned   Type 2 diabetes - Will give half home dose of Lantus 12 units while n.p.o. SSI  Mild hydronephrosis -check UA Correlate with prostatism  Hypokalemia will be repleted    Best Practice (right click and "Reselect all SmartList Selections" daily)   Diet/type: NPO DVT prophylaxis: SCD GI prophylaxis: H2B Lines: N/A Foley:  N/A Code Status:  full code Last date of multidisciplinary goals of care discussion [NA]  Labs   CBC: Recent Labs  Lab 10/12/22 1802  WBC 6.7  NEUTROABS 3.8  HGB 14.3  HCT 41.5  MCV 89.2  PLT 315    Basic Metabolic Panel: Recent Labs  Lab 10/12/22 1802  NA 137  K 3.4*  CL 99  CO2 25  GLUCOSE 261*  BUN 9  CREATININE 0.88  CALCIUM 9.3   GFR: Estimated Creatinine Clearance: 72.6 mL/min (by C-G formula based on  SCr of 0.88 mg/dL). Recent Labs  Lab 10/12/22 1802  WBC 6.7    Liver Function Tests: Recent Labs  Lab 10/12/22 1802  AST 17  ALT 11  ALKPHOS 44  BILITOT 0.5  PROT 7.0  ALBUMIN 4.2   No results for input(s): "LIPASE", "AMYLASE" in the last 168 hours. No results for input(s):  "AMMONIA" in the last 168 hours.  ABG    Component Value Date/Time   TCO2 22 07/17/2020 0547     Coagulation Profile: Recent Labs  Lab 10/12/22 1802  INR 1.0    Cardiac Enzymes: No results for input(s): "CKTOTAL", "CKMB", "CKMBINDEX", "TROPONINI" in the last 168 hours.  HbA1C: Hemoglobin A1C  Date/Time Value Ref Range Status  07/23/2022 11:30 AM 8.9 (A) 4.0 - 5.6 % Final  12/23/2020 12:00 AM 9.5  Final   Hgb A1c MFr Bld  Date/Time Value Ref Range Status  04/11/2022 10:45 AM 9.1 (H) 4.8 - 5.6 % Final    Comment:             Prediabetes: 5.7 - 6.4          Diabetes: >6.4          Glycemic control for adults with diabetes: <7.0   12/11/2021 02:07 PM 9.6 (H) 4.8 - 5.6 % Final    Comment:             Prediabetes: 5.7 - 6.4          Diabetes: >6.4          Glycemic control for adults with diabetes: <7.0     CBG: Recent Labs  Lab 10/12/22 1752  GLUCAP 233*    Review of Systems:   Unable to obtain  Past Medical History:  He,  has a past medical history of AAA (abdominal aortic aneurysm) (HCC), Arthritis, Carotid artery disease (HCC), Cataract, Colon polyp, COPD (chronic obstructive pulmonary disease) (HCC), Coronary artery disease, Essential hypertension, GERD (gastroesophageal reflux disease), Hemorrhoids, History of TIA (transient ischemic attack), Low back pain, Lumbar radiculopathy, Mixed hyperlipidemia, Palpitations, Peripheral vascular disease (HCC), Shingles (09/23/2019), Stroke (HCC), Tobacco dependence, and Type 2 diabetes mellitus (HCC).   Surgical History:   Past Surgical History:  Procedure Laterality Date   APPENDECTOMY  1970   BIOPSY  12/08/2017   Procedure: BIOPSY;  Surgeon: Corbin Ade, MD;  [facility];  Service: Endoscopy;;  esophagus    BIOPSY  12/28/2020   Procedure: BIOPSY;  Surgeon: Corbin Ade, MD;  [facility];  Service: Endoscopy;;   BIOPSY  07/12/2021   Procedure: BIOPSY;  Surgeon: Corbin Ade, MD;   [facility];  Service: Endoscopy;;   BREAST SURGERY Right    Cyst resection on the right   CARDIAC CATHETERIZATION  1990's   in [place]. no stent placement   CATARACT EXTRACTION W/PHACO Right 06/18/2016   Procedure: CATARACT EXTRACTION PHACO AND INTRAOCULAR LENS PLACEMENT (IOC);  Surgeon: Galen Manila, MD;  Location: [facility];  Service: Ophthalmology;  Laterality: Right;  [name] 35.7 AP% 16.6 CDE 5.94 Fluid pack lot # [id number]   CATARACT EXTRACTION W/PHACO Left 07/16/2016   Procedure: CATARACT EXTRACTION PHACO AND INTRAOCULAR LENS PLACEMENT (IOC);  Surgeon: Galen Manila, MD;  Location: [facility];  Service: Ophthalmology;  Laterality: Left;  [name] 51.8 AP% 12.7 CDE 6.58 Fluid Pack Lot # [id number]   CHOLECYSTECTOMY  1993   COLONOSCOPY  02/12/2007   PIR:JJOACZYSA friable anal canal hemorrhoids, otherwise normal rectum and colon  COLONOSCOPY N/A 07/16/2012   RMR: Colonic diverticulosis. 4 mm tubular adenoma   COLONOSCOPY WITH PROPOFOL N/A 12/08/2017   Dr. Jena Gauss: sigmoid and descending colon diverticulosis, transverse colon polyp (TUBULAR ADENOMA)   COLONOSCOPY WITH PROPOFOL N/A 03/06/2022   Procedure: COLONOSCOPY WITH PROPOFOL;  Surgeon: Corbin Ade, MD;  [facility];  Service: Endoscopy;  Laterality: N/A;  12:30 PM   ENDARTERECTOMY Right 10/08/2019   Procedure: RIGHT CAROTID ENDARTERECTOMY;  Surgeon: Larina Earthly, MD;  Location: [facility];  Service: Vascular;  Laterality: Right;   ESOPHAGOGASTRODUODENOSCOPY (EGD) WITH ESOPHAGEAL DILATION N/A 07/16/2012   RMR: +Candida esophagitis, Erosive reflux esophagiits. Schatizi's ring status post dilation. Hiatal hernia. Antral erosions status post biopsy.    ESOPHAGOGASTRODUODENOSCOPY (EGD) WITH PROPOFOL N/A 12/08/2017   Dr. Jena Gauss: esophagitis, query EOE but negative for increased eosinophils on path, small hiatal hernia, normal stomach, normal duodenum, PAS stain with rare yeast forms on stain. Empiric dilation    ESOPHAGOGASTRODUODENOSCOPY (EGD) WITH PROPOFOL N/A 12/28/2020   esophagitis without bleeding, s/p biopsy and dilation. Antral erosions s/p biopsy.   ESOPHAGOGASTRODUODENOSCOPY (EGD) WITH PROPOFOL N/A 07/12/2021   Surgeon: Corbin Ade, MD;  Cobblestoning appearance of distal esophagus, slight narrowing of distal esophagus s/p dilation with 56 Fr and biopsy, gastric erosions biopsied, normal examined duodenum.  Esophageal biopsy with reactive squamous mucosa, gastric biopsy with reactive gastropathy, negative for H. pylori.   ESOPHAGOGASTRODUODENOSCOPY (EGD) WITH PROPOFOL N/A 03/06/2022   Procedure: ESOPHAGOGASTRODUODENOSCOPY (EGD) WITH PROPOFOL;  Surgeon: Corbin Ade, MD;  [facility];  Service: Endoscopy;  Laterality: N/A;   EYE SURGERY Bilateral 2018   cataract   GIVENS CAPSULE STUDY N/A 03/13/2022   Procedure: GIVENS CAPSULE STUDY;  Surgeon: Corbin Ade, MD;  [facility];  Service: Endoscopy;  Laterality: N/A;  7:30am   HERNIA REPAIR     JOINT REPLACEMENT     KNEE SURGERY     Left knee arthroscopy torn medial meniscus grade 4 chondral changes medial femoral condyle tibial plateau   MALONEY DILATION N/A 12/08/2017   Procedure: MALONEY DILATION;  Surgeon: Corbin Ade, MD;  [facility];  Service: Endoscopy;  Laterality: N/A;   MALONEY DILATION N/A 12/28/2020   Procedure: [name] DILATION;  Surgeon: Corbin Ade, MD;  [facility];  Service: Endoscopy;  Laterality: N/A;   MALONEY DILATION N/A 07/12/2021   Procedure: [name] DILATION;  Surgeon: Corbin Ade, MD;  [facility];  Service: Endoscopy;  Laterality: N/A;   PATCH ANGIOPLASTY Right 10/08/2019   Procedure: PATCH ANGIOPLASTY of the right common carotid artery using hemashield plaltinum finesse patch;  Surgeon: Larina Earthly, MD;  Location: [facility];  Service: Vascular;  Laterality: Right;   PERIPHERAL VASCULAR INTERVENTION  07/17/2020   Procedure: PERIPHERAL  VASCULAR INTERVENTION;  Surgeon: Maeola Harman, MD;  [facility] INVASIVE CV LAB;  Service: Cardiovascular;;   POLYPECTOMY  12/08/2017   Procedure: POLYPECTOMY;  Surgeon: Corbin Ade, MD;  [facility];  Service: Endoscopy;;  colon   POLYPECTOMY  03/06/2022   Procedure: POLYPECTOMY;  Surgeon: Corbin Ade, MD;  [facility];  Service: Endoscopy;;   TOTAL KNEE ARTHROPLASTY Left 04/27/2012   Procedure: TOTAL KNEE ARTHROPLASTY;  Surgeon: Vickki Hearing, MD;  Location: [facility];  Service: Orthopedics;  Laterality: Left;   TRANSCAROTID ARTERY REVASCULARIZATION  Left 10/12/2021   Procedure: Left Transcarotid Artery Revascularization;  Surgeon: Maeola Harman, MD;  Location: [facility] OR;  Service: Vascular;  Laterality: Left;   UMBILICAL HERNIA REPAIR  1993   VISCERAL ANGIOGRAPHY N/A 07/17/2020   Procedure: mesenteric ANGIOGRAPHY;  Surgeon: Maeola Harman, MD;  Location: [facility] INVASIVE CV LAB;  Service: Cardiovascular;  Laterality: N/A;     Social History:   reports that he has been smoking cigarettes. He has a 15 pack-year smoking history. He quit smokeless tobacco use about 4 years ago.  His smokeless tobacco use included chew. He reports that he does not currently use alcohol. He reports that he does not use drugs.   Family History:  His family history includes Cancer in his father, maternal grandmother, and paternal grandmother; Cancer - Lung in his father and maternal grandfather; Colon cancer in some other family members; Colon polyps in his mother; Coronary artery disease in his father; Dementia in his mother; Diabetes in his maternal grandfather; Emphysema in his maternal grandmother; Heart attack in his father and paternal grandfather; Heart disease in his brother; Hypertension in his father and mother; Osteoporosis in his mother.   Allergies No Known Allergies   Home Medications  Prior to Admission medications   Medication Sig  Start Date End Date Taking? Authorizing Provider  albuterol (PROVENTIL) (2.5 MG/3ML) 0.083% nebulizer solution Take 3 mLs (2.5 mg total) by nebulization every 6 (six) hours as needed for wheezing or shortness of breath. 09/23/22   [name], MD  albuterol (VENTOLIN HFA) 108 (90 Base) MCG/ACT inhaler Inhale 2 puffs into the lungs every 6 (six) hours as needed for wheezing or shortness of breath. 08/09/21   Donell Beers, FNP  aspirin EC 81 MG tablet Take 1 tablet (81 mg total) by mouth daily at 6 (six) AM. Swallow whole. 10/14/21   Rhyne, Ames Coupe, PA-C  azithromycin (ZITHROMAX Z-PAK) 250 MG tablet Take 2 tablets (500 mg) PO today, then 1 tablet (250 mg) PO daily x4 days. 08/20/22   [name], MD  benzonatate (TESSALON PERLES) 100 MG capsule Take 2 capsules (200 mg total) by mouth 3 (three) times daily as needed for cough. 08/08/22   [name], MD  clopidogrel (PLAVIX) 75 MG tablet Take 1 tablet by mouth once daily 09/16/22   [name], MD  Continuous Blood Gluc Sensor (FREESTYLE LIBRE 3 SENSOR) MISC 1 each by Does not apply route every 14 (fourteen) days. Place 1 sensor on the skin every 14 days. Use to check glucose continuously 12/19/21   [name], MD  dicyclomine (BENTYL) 20 MG tablet TAKE 1 TABLET BY MOUTH 4 TIMES DAILY AS NEEDED FOR SPASMS 09/16/22   [name], MD  diphenhydrAMINE (BENADRYL) 25 MG tablet Take 25 mg by mouth daily as needed for allergies.     [provider]  famotidine (PEPCID) 20 MG tablet Take 1 tablet (20 mg total) by mouth 2 (two) times daily. 05/08/21   Gelene Mink, NP  fenofibrate (TRICOR) 145 MG tablet Take 1 tablet (145 mg total) by mouth daily. 03/11/22   Sharlene Dory, NP  Glucagon (GVOKE HYPOPEN 2-PACK) 1 MG/0.2ML SOAJ Inject 1 mg into the skin as needed (hypoglycemia). 12/11/21   [name], MD  insulin glargine (LANTUS) 100 UNIT/ML injection Inject 0.24 mLs (24 Units total) into the skin daily. 04/04/22   [name], MD  lansoprazole (PREVACID) 30 MG capsule TAKE 1 CAPSULE BY MOUTH TWICE DAILY BEFORE A MEAL 07/09/22   Rourk, Gerrit Friends, MD  lisinopril (ZESTRIL) 20 MG tablet Take 1 tablet (20  mg total) by mouth daily. 09/17/22   [name], MD  metFORMIN (GLUCOPHAGE) 1000 MG tablet TAKE 1 TABLET BY MOUTH TWICE DAILY WITH A MEAL 09/03/22   [name], MD  metoprolol tartrate (LOPRESSOR) 25 MG tablet Take 1 tablet (25 mg total) by mouth 2 (two) times daily. 04/11/22   Sharlene Dory, NP  nitroGLYCERIN (NITROSTAT) 0.4 MG SL tablet Place 1 tablet (0.4 mg total) under the tongue every 5 (five) minutes as needed for chest pain (Do not excieed more than 3 tablets in 15 minutes.). 03/11/22 09/17/22  Sharlene Dory, NP  NOVOLOG 100 UNIT/ML injection Inject 14 Units into the skin 3 (three) times daily with meals. 01/08/22   [name], MD  rosuvastatin (CRESTOR) 40 MG tablet Take 1 tablet by mouth once daily 10/01/22   Jonelle Sidle, MD  Semaglutide,0.25 or 0.5MG /DOS, (OZEMPIC, 0.25 OR 0.5 MG/DOSE,) 2 MG/3ML SOPN Inject 0.25 mg into the skin once a week. 08/20/22   [name], MD     Critical care time: 72m    Cyril Mourning MD. [facility]. [place] [facility] Pager : [phone]  If no response to pager , please call [phone] until 7 pm After 7:00 pm call Elink  [phone]   10/12/2022

## 2022-10-12 NOTE — ED Notes (Signed)
Labetalol given and 2 mg of ativan given.

## 2022-10-12 NOTE — ED Provider Notes (Incomplete Revision)
[place] EMERGENCY DEPARTMENT AT [facility] Provider Note   CSN: [id number] Arrival date & time: 10/12/22  1735     History  Chief Complaint  Patient presents with   Code Stroke    Jimmy Duran is a 67 y.o. male.  This patient is a 67 year old male presenting to the hospital with a history of prior cardiac ischemia requiring intervention back in December 2023, history of multiple transient ischemic attacks, he is a diabetic on insulin and metformin and is a heavy smoker.  Reportedly the patient was normal at 3:00, he was then found to be stumbling in the yard shortly thereafter with the inability to say words correctly, identify people that he knew, could not have a conversation and seemed to be having right arm weakness.  They brought him to the hospital, on arrival the patient is not able to answer any questions, the family are the primary historians and report that he was at the store with them just prior to 3:00 walking around and doing everything per normal.        Home Medications Prior to Admission medications   Medication Sig Start Date End Date Taking? Authorizing Provider  albuterol (PROVENTIL) (2.5 MG/3ML) 0.083% nebulizer solution Take 3 mLs (2.5 mg total) by nebulization every 6 (six) hours as needed for wheezing or shortness of breath. 09/23/22   [name], MD  albuterol (VENTOLIN HFA) 108 (90 Base) MCG/ACT inhaler Inhale 2 puffs into the lungs every 6 (six) hours as needed for wheezing or shortness of breath. 08/09/21   Donell Beers, FNP  aspirin EC 81 MG tablet Take 1 tablet (81 mg total) by mouth daily at 6 (six) AM. Swallow whole. 10/14/21   Rhyne, Ames Coupe, PA-C  azithromycin (ZITHROMAX Z-PAK) 250 MG tablet Take 2 tablets (500 mg) PO today, then 1 tablet (250 mg) PO daily x4 days. 08/20/22   [name], MD  benzonatate (TESSALON PERLES) 100 MG capsule Take 2 capsules (200 mg total) by mouth 3 (three) times daily as needed for cough.  08/08/22   [name], MD  clopidogrel (PLAVIX) 75 MG tablet Take 1 tablet by mouth once daily 09/16/22   [name], MD  Continuous Blood Gluc Sensor (FREESTYLE LIBRE 3 SENSOR) MISC 1 each by Does not apply route every 14 (fourteen) days. Place 1 sensor on the skin every 14 days. Use to check glucose continuously 12/19/21   [name], MD  dicyclomine (BENTYL) 20 MG tablet TAKE 1 TABLET BY MOUTH 4 TIMES DAILY AS NEEDED FOR SPASMS 09/16/22   [name], MD  diphenhydrAMINE (BENADRYL) 25 MG tablet Take 25 mg by mouth daily as needed for allergies.     [provider]  famotidine (PEPCID) 20 MG tablet Take 1 tablet (20 mg total) by mouth 2 (two) times daily. 05/08/21   Gelene Mink, NP  fenofibrate (TRICOR) 145 MG tablet Take 1 tablet (145 mg total) by mouth daily. 03/11/22   Sharlene Dory, NP  Glucagon (GVOKE HYPOPEN 2-PACK) 1 MG/0.2ML SOAJ Inject 1 mg into the skin as needed (hypoglycemia). 12/11/21   [name], MD  insulin glargine (LANTUS) 100 UNIT/ML injection Inject 0.24 mLs (24 Units total) into the skin daily. 04/04/22   [name], MD  lansoprazole (PREVACID) 30 MG capsule TAKE 1 CAPSULE BY MOUTH TWICE DAILY BEFORE A MEAL 07/09/22   Rourk, Gerrit Friends, MD  lisinopril (ZESTRIL) 20 MG tablet Take 1 tablet (20  mg total) by mouth daily. 09/17/22   [name], MD  metFORMIN (GLUCOPHAGE) 1000 MG tablet TAKE 1 TABLET BY MOUTH TWICE DAILY WITH A MEAL 09/03/22   [name], MD  metoprolol tartrate (LOPRESSOR) 25 MG tablet Take 1 tablet (25 mg total) by mouth 2 (two) times daily. 04/11/22   Sharlene Dory, NP  nitroGLYCERIN (NITROSTAT) 0.4 MG SL tablet Place 1 tablet (0.4 mg total) under the tongue every 5 (five) minutes as needed for chest pain (Do not excieed more than 3 tablets in 15 minutes.). 03/11/22 09/17/22  Sharlene Dory, NP  NOVOLOG 100 UNIT/ML injection Inject 14 Units into the skin 3 (three) times daily with meals. 01/08/22   [name], MD   rosuvastatin (CRESTOR) 40 MG tablet Take 1 tablet by mouth once daily 10/01/22   Jonelle Sidle, MD  Semaglutide,0.25 or 0.5MG /DOS, (OZEMPIC, 0.25 OR 0.5 MG/DOSE,) 2 MG/3ML SOPN Inject 0.25 mg into the skin once a week. 08/20/22   [name], MD      Allergies    Patient has no known allergies.    Review of Systems   Review of Systems  Unable to perform ROS: Acuity of condition    Physical Exam Updated Vital Signs BP (!) 164/82   Pulse (!) 112   Temp (!) 97.3 F (36.3 C)   Resp (!) 25   Wt 60.5 kg Comment: 09/17/22  SpO2 94%   BMI 20.89 kg/m  Physical Exam Vitals and nursing note reviewed.  Constitutional:      General: He is not in acute distress.    Appearance: He is well-developed.  HENT:     Head: Normocephalic and atraumatic.     Mouth/Throat:     Pharynx: No oropharyngeal exudate.  Eyes:     General: No scleral icterus.       Right eye: No discharge.        Left eye: No discharge.     Conjunctiva/sclera: Conjunctivae normal.     Pupils: Pupils are equal, round, and reactive to light.  Neck:     Thyroid: No thyromegaly.     Vascular: No JVD.  Cardiovascular:     Rate and Rhythm: Normal rate and regular rhythm.     Heart sounds: Normal heart sounds. No murmur heard.    No friction rub. No gallop.  Pulmonary:     Effort: Pulmonary effort is normal. No respiratory distress.     Breath sounds: Normal breath sounds. No wheezing or rales.  Abdominal:     General: Bowel sounds are normal. There is no distension.     Palpations: Abdomen is soft. There is no mass.     Tenderness: There is no abdominal tenderness.  Musculoskeletal:        General: No tenderness. Normal range of motion.     Cervical back: Normal range of motion and neck supple.  Lymphadenopathy:     Cervical: No cervical adenopathy.  Skin:    General: Skin is warm and dry.     Findings: No erythema or rash.  Neurological:     Mental Status: He is alert.     Coordination: Coordination  normal.     Comments: R facial droop  Psychiatric:        Behavior: Behavior normal.     ED Results / Procedures / Treatments   Labs (all labs ordered are listed, but only abnormal results are displayed) Labs Reviewed  COMPREHENSIVE METABOLIC PANEL - Abnormal; Notable for  the following components:      Result Value   Potassium 3.4 (*)    Glucose, Bld 261 (*)    All other components within normal limits  CBG MONITORING, ED - Abnormal; Notable for the following components:   Glucose-Capillary 233 (*)    All other components within normal limits  ETHANOL  PROTIME-INR  APTT  CBC  DIFFERENTIAL  RAPID URINE DRUG SCREEN, HOSP PERFORMED  URINALYSIS, ROUTINE W REFLEX MICROSCOPIC  I-STAT CHEM 8, ED    EKG None  Radiology CT ANGIO HEAD NECK W WO CM (CODE STROKE)  Result Date: 10/12/2022 CLINICAL DATA:  Code stroke EXAM: CT ANGIOGRAPHY HEAD AND NECK WITH AND WITHOUT CONTRAST TECHNIQUE: Multidetector CT imaging of the head and neck was performed using the standard protocol during bolus administration of intravenous contrast. Multiplanar CT image reconstructions and MIPs were obtained to evaluate the vascular anatomy. Carotid stenosis measurements (when applicable) are obtained utilizing NASCET criteria, using the distal internal carotid diameter as the denominator. RADIATION DOSE REDUCTION: This exam was performed according to the departmental dose-optimization program which includes automated exposure control, adjustment of the mA and/or kV according to patient size and/or use of iterative reconstruction technique. CONTRAST:  75mL OMNIPAQUE IOHEXOL 350 MG/ML SOLN COMPARISON:  Same-day noncontrast head CT, CTA neck 09/10/2021, MRA head 01/04/2014 FINDINGS: CTA NECK FINDINGS Aortic arch: There is mild calcified plaque in the imaged aortic arch. The origins of the major branch vessels are patent. The subclavian arteries are patent to the level imaged with mild stenosis proximally on the right.  Right carotid system: The right common, internal, and external carotid arteries are patent, without hemodynamically significant stenosis or occlusion. There is no evidence of dissection or aneurysm. Left carotid system: The left common carotid artery is patent with mild calcified plaque but no significant stenosis or occlusion. A stent is seen traversing the distal common carotid artery through the internal carotid artery. There is narrowing of the stent at the level of the bifurcation due to significant eccentric plaque resulting in approximately 75% stenosis, though measurement is difficult due to streak artifact from the stent. The internal carotid artery distal to the stent is widely patent. The external carotid artery is stenotic at its origin but patent. There is no evidence of dissection or aneurysm. Vertebral arteries: There is severe stenosis of the left vertebral artery at its origin. There is moderate stenosis of the right vertebral artery at its origin. The remainder of the vertebral arteries are patent, without other hemodynamically significant stenosis or occlusion. There is no evidence of dissection or aneurysm. Skeleton: There is degenerative change most advanced at C4-C5 and C5-C6 with reversal of the normal curvature. There is no acute osseous abnormality or suspicious osseous lesion. There is no visible canal hematoma. Other neck: The soft tissues of the neck are unremarkable. Upper chest: There is mild emphysema in the lung apices. There is debris in the trachea. Review of the MIP images confirms the above findings CTA HEAD FINDINGS Anterior circulation: There is calcified plaque in the intracranial ICAs resulting in severe stenosis of the right cavernous segment and left supraclinoid segment. The ICA termini are patent. The bilateral MCAs and ACAS are patent, without proximal stenosis or occlusion. The anterior communicating artery is normal. There is no aneurysm or AVM. Posterior circulation:  There is calcified plaque in the bilateral V4 segments resulting in mild stenosis bilaterally. The basilar artery is patent with mild atherosclerotic plaque. The major cerebellar arteries appear patent. The bilateral PCAs  are patent, with mild atherosclerotic irregularity but no proximal high-grade stenosis or occlusion. A small right posterior communicating artery is identified. There is no aneurysm or AVM. Venous sinuses: Patent. Anatomic variants: None. Review of the MIP images confirms the above findings IMPRESSION: 1. No emergent vascular finding. 2. Patent stent in the left common and internal carotid arteries with approximately 75% stenosis at the level of the bifurcation due to significant eccentric plaque. The internal carotid artery distal to the stent is widely patent. 3. Severe left and moderate right vertebral artery origin stenosis. 4. Calcified plaque in the intracranial ICAs resulting in severe stenosis of the right cavernous segment and left supraclinoid segment. Otherwise, patent intracranial vasculature with mild atherosclerotic irregularity but no other high-grade stenosis or occlusion. 5. Debris in the trachea. Correlate with any signs or symptoms of aspiration. 6. Mild emphysema. These results were called by telephone at the time of interpretation on 10/12/2022 at 6:27 pm to provider Dr Wilford Corner, who verbally acknowledged these results. Electronically Signed   By: Lesia Hausen M.D.   On: 10/12/2022 18:33   CT HEAD CODE STROKE WO CONTRAST  Result Date: 10/12/2022 CLINICAL DATA:  Code stroke. Neuro deficit, acute, stroke suspected. Confusion. Aphasia. Weakness in the upper extremities bilaterally. Last known well at 1530 hours. EXAM: CT HEAD WITHOUT CONTRAST TECHNIQUE: Contiguous axial images were obtained from the base of the skull through the vertex without intravenous contrast. RADIATION DOSE REDUCTION: This exam was performed according to the departmental dose-optimization program which  includes automated exposure control, adjustment of the mA and/or kV according to patient size and/or use of iterative reconstruction technique. COMPARISON:  CT head contrast 01/04/2014 FINDINGS: Brain: Cortical and subcortical white matter hypoattenuation is present in the high left parietal lobe without significant volume loss. Mild generalized atrophy is present. Moderate periventricular and diffuse subcortical white matter hypoattenuation is present bilaterally. No acute other cortical infarct is present. Insular ribbon is normal bilaterally. Basal ganglia are intact. The ventricles are of normal size. No significant extraaxial fluid collection is present. The brainstem and cerebellum are within normal limits. Midline structures are within normal limits. Vascular: Atherosclerotic calcifications are present within the cavernous internal carotid arteries and at the dural margin of both vertebral arteries. Skull: Calvarium is intact. No focal lytic or blastic lesions are present. No significant extracranial soft tissue lesion is present. Sinuses/Orbits: The paranasal sinuses and mastoid air cells are clear. The globes and orbits are within normal limits. ASPECTS [facility] Stroke Program Early CT Score) - Ganglionic level infarction (caudate, lentiform nuclei, internal capsule, insula, M1-M3 cortex): 6/7 - Supraganglionic infarction (M4-M6 cortex): 3/3 Total score (0-10 with 10 being normal): 9/10 IMPRESSION: 1. Cortical and subcortical white matter hypoattenuation in the high left parietal lobe without significant volume loss. This is concerning for an acute or subacute infarct. 2. No other acute cortical infarct. 3. Moderate periventricular and diffuse subcortical white matter hypoattenuation bilaterally. This likely reflects the sequela of chronic microvascular ischemia. 4. Aspects is 9/10. These results were called by telephone at the time of interpretation on 10/12/2022 at 6:01 pm to provider [facility] , who  verbally acknowledged these results. Electronically Signed   By: Marin Roberts M.D.   On: 10/12/2022 18:01    Procedures .Critical Care  Performed by: Eber Hong, MD Authorized by: Eber Hong, MD   Critical care provider statement:    Critical care time (minutes):  30   Critical care time was exclusive of:  Separately billable procedures and treating other  patients and teaching time   Critical care was necessary to treat or prevent imminent or life-threatening deterioration of the following conditions:  CNS failure or compromise   Critical care was time spent personally by me on the following activities:  Development of treatment plan with patient or surrogate, discussions with consultants, evaluation of patient's response to treatment, examination of patient, ordering and review of laboratory studies, ordering and review of radiographic studies, ordering and performing treatments and interventions, pulse oximetry, re-evaluation of patient's condition, review of old charts and obtaining history from patient or surrogate   I assumed direction of critical care for this patient from another provider in my specialty: no     Care discussed with: admitting provider and accepting provider at another facility   Comments:       Procedure Name: Intubation Date/Time: 10/12/2022 8:26 PM  Performed by: Eber Hong, MDPre-anesthesia Checklist: Suction available, Emergency Drugs available, Patient identified, Patient being monitored and Timeout performed Oxygen Delivery Method: Ambu bag Preoxygenation: Pre-oxygenation with 100% oxygen Induction Type: Rapid sequence Ventilation: Mask ventilation without difficulty Laryngoscope Size: 4 Grade View: Grade II Tube size: 7.5 mm Number of attempts: 1 Airway Equipment and Method: Stylet Placement Confirmation: ETT inserted through vocal cords under direct vision and Positive ETCO2 Secured at: 24 cm Tube secured with: ETT holder Dental Injury:  Teeth and Oropharynx as per pre-operative assessment  Difficulty Due To: Difficulty was unanticipated Comments:          Medications Ordered in ED Medications  labetalol (NORMODYNE) injection 20 mg (20 mg Intravenous Given 10/12/22 1815)    And  clevidipine (CLEVIPREX) infusion 0.5 mg/mL (2 mg/hr Intravenous New Bag/Given 10/12/22 1819)  fentaNYL (SUBLIMAZE) 50 MCG/ML injection (has no administration in time range)  propofol (DIPRIVAN) 1000 MG/100ML infusion (has no administration in time range)  iohexol (OMNIPAQUE) 350 MG/ML injection 75 mL (75 mLs Intravenous Contrast Given 10/12/22 1757)  tenecteplase (TNKASE) injection for Stroke 15 mg (15 mg Intravenous Given 10/12/22 1831)  hydrALAZINE (APRESOLINE) injection 10 mg (10 mg Intravenous Given 10/12/22 1822)  LORazepam (ATIVAN) injection 2 mg (2 mg Intravenous Given 10/12/22 1814)  diazepam (VALIUM) injection 5 mg (5 mg Intravenous Given 10/12/22 1844)  rocuronium bromide 100 MG/10ML SOSY (100 mg  Given 10/12/22 2022)  etomidate (AMIDATE) 2 MG/ML injection (20 mg  Given 10/12/22 2020)  fentaNYL (SUBLIMAZE) injection 12.5 mcg (12.5 mcg Intravenous Given 10/12/22 2017)    ED Course/ Medical Decision Making/ A&P                                 Medical Decision Making Amount and/or Complexity of Data Reviewed Labs: ordered. Radiology: ordered.  Risk Prescription drug management. Decision regarding hospitalization.    This patient presents to the ED for concern of possible stroke with weakness and aphasia, this involves an extensive number of treatment options, and is a complaint that carries with it a high risk of complications and morbidity.  The differential diagnosis includes stroke, hemorrhage, hypoglycemia, infection   Co morbidities that complicate the patient evaluation  Diabetes, heavy smoker   Additional history obtained:  Additional history obtained from medical record External records from outside source obtained  and reviewed including multiple prior outpatient visits, followed by Dr. Durwin Nora for his hypertension and diabetes and iron deficiency anemia,   Lab Tests:  I Ordered, and personally interpreted labs.  The pertinent results include: Alcohol undetectable, CBC unremarkable, metabolic panel with  hyperglycemia at 260, minimal hypokalemia at 3.4,   Imaging Studies ordered:  I ordered imaging studies including CT angiogram of the head and neck as well as a CT scan without contrast I independently visualized and interpreted imaging which showed stent in the left carotid, stenosis present in vertebral artery on the right and left, subcortical white matter hypoattenuation in the high left parietal lobe consistent with an acute infarct I agree with the radiologist interpretation   Cardiac Monitoring: / EKG:  The patient was maintained on a cardiac monitor.  I personally viewed and interpreted the cardiac monitored which showed an underlying rhythm of: Sinus rhythm, mild tachycardia   Consultations Obtained:  I requested consultation with the neurologist Dr. Jerrell Belfast,  and discussed lab and imaging findings as well as pertinent plan - they recommend: Aggressive control of blood pressure which was severely elevated, requiring labetalol, Cleviprex as needed, TNK   Problem List / ED Course / Critical interventions / Medication management  As above multiple meds required for severe hypertension and hypertensive emergency with acute stroke  Reevaluation of the patient after these medicines showed that the patient slight improvement I have reviewed the patients home medicines and have made adjustments as needed   Social Determinants of Health:  Heavy smoker   Test / Admission - Considered:  Admit to neuro ICU at [facility]   The patient had equal position after receiving thrombolytic therapy, he became slightly more agitated he was requiring Cleviprex for blood pressure control,  multiple doses of benzodiazepines and ultimately at the recommendation of neurology a small amount of fentanyl was given.  This caused him respiratory depression and ongoing agitation, ultimately intubation was required for stability of the patient to go to CT scan.  He will go to [facility] once CT is completed, looking for hemorrhage at this time.  Intubation successful without complications      Final Clinical Impression(s) / ED Diagnoses Final diagnoses:  Acute ischemic stroke [facility])     Eber Hong, MD 10/12/22 [phone]

## 2022-10-12 NOTE — ED Triage Notes (Signed)
Pt bib wife and son for slurred speech and right sided weakness. LKW 1530  Pt stumbling around the yard, unable to remember childs name when wife was asking. During triage pt was not speaking.

## 2022-10-12 NOTE — Consult Note (Signed)
Code stroke activated at 1750. Dr Hyacinth Meeker giving report over camera while pt in CT.   Paged Dr Wilford Corner at [phone]. Dr Wilford Corner on camera at 1755. Pt still in CT. Report given and talking with family at this time.   mRS 1. LKW 1530.   Returned from CT at 1801.  Decision for TNK at 1808. Dr Hyacinth Meeker aware. TNK at bedside at 1812. Delay in bolus time d/t HTN. Pt required labetalol, hydralazine, and cleviprex with 2 titrations prior to being able to give TNK.   TNK bolus at 1831 after BP recheck down to 138/89.   Dr Wilford Corner off camera at [phone].  TSRN off camera at 1901.  Jimmy Duran, Telestroke RN

## 2022-10-12 NOTE — ED Notes (Signed)
Neuro cart called back and spoke to Dr. Hyacinth Meeker

## 2022-10-12 NOTE — ED Notes (Signed)
Code stroke 17:46

## 2022-10-12 NOTE — ED Notes (Signed)
Pt to CT on monitor with this nurse, kim, RT and Jeanella Anton, RN with [facility]

## 2022-10-12 NOTE — ED Notes (Signed)
Patient transported to CT 

## 2022-10-12 NOTE — Consult Note (Signed)
1959 Stroke cart activated and elert sent to [facility].  Pt presents with c/o agitation after TNK. 2005 EDP Hyacinth Meeker) at bedside assessing pt. 2006 Stat consult submitted per Hyacinth Meeker request. 2012 Hyacinth Meeker heard speaking on the phone giving report to TSP 2015 No further need from [facility] Cart disconnected at this time.

## 2022-10-12 NOTE — Progress Notes (Signed)
eLink Physician-Brief Progress Note Patient Name: Jimmy Duran DOB: 1955/10/21 MRN: [id number]   Date of Service  10/12/2022  HPI/Events of Note  67 yr old male presented to [facility] with speech difficulty and right sided weakness.  DX with acute CVA.  Received TNK.  Intubated for agitation.  Transferred to [facility] for further evaluation and management.    eICU Interventions  Chart reviewed     Intervention Category Evaluation Type: New Patient Evaluation  Henry Russel, P 10/12/2022, 11:53 PM

## 2022-10-12 NOTE — Progress Notes (Signed)
1747 call time 1750 began time 1751 end time 1751 images sent to soc 1752 call to [facility] radiology

## 2022-10-13 ENCOUNTER — Inpatient Hospital Stay (HOSPITAL_COMMUNITY): Payer: Medicare PPO

## 2022-10-13 DIAGNOSIS — I6389 Other cerebral infarction: Secondary | ICD-10-CM

## 2022-10-13 DIAGNOSIS — R569 Unspecified convulsions: Secondary | ICD-10-CM | POA: Diagnosis not present

## 2022-10-13 DIAGNOSIS — I639 Cerebral infarction, unspecified: Secondary | ICD-10-CM | POA: Diagnosis not present

## 2022-10-13 LAB — RAPID URINE DRUG SCREEN, HOSP PERFORMED
Amphetamines: NOT DETECTED
Barbiturates: NOT DETECTED
Benzodiazepines: POSITIVE — AB
Cocaine: NOT DETECTED
Opiates: NOT DETECTED
Tetrahydrocannabinol: NOT DETECTED

## 2022-10-13 LAB — URINALYSIS, ROUTINE W REFLEX MICROSCOPIC
Bilirubin Urine: NEGATIVE
Glucose, UA: >=500 mg/dL — AB
Ketones, ur: 5 mg/dL — AB
Leukocytes,Ua: NEGATIVE
Nitrite: NEGATIVE
Protein, ur: 30 mg/dL — AB
RBC / HPF: >50 RBC/hpf (ref 0–5)
Specific Gravity, Urine: 1.029 (ref 1.005–1.030)
pH: 6 (ref 5.0–8.0)

## 2022-10-13 LAB — BASIC METABOLIC PANEL WITH GFR
Anion gap: 10 (ref 5–15)
BUN: 11 mg/dL (ref 8–23)
CO2: 24 mmol/L (ref 22–32)
Calcium: 9.1 mg/dL (ref 8.9–10.3)
Chloride: 102 mmol/L (ref 98–111)
Creatinine, Ser: 1.14 mg/dL (ref 0.61–1.24)
GFR, Estimated: >60 mL/min
Glucose, Bld: 213 mg/dL — ABNORMAL HIGH (ref 70–99)
Potassium: 3.6 mmol/L (ref 3.5–5.1)
Sodium: 136 mmol/L (ref 135–145)

## 2022-10-13 LAB — CBC
HCT: 41.3 % (ref 39.0–52.0)
Hemoglobin: 14.3 g/dL (ref 13.0–17.0)
MCH: 30.4 pg (ref 26.0–34.0)
MCHC: 34.6 g/dL (ref 30.0–36.0)
MCV: 87.9 fL (ref 80.0–100.0)
Platelets: 331 K/uL (ref 150–400)
RBC: 4.7 MIL/uL (ref 4.22–5.81)
RDW: 12.9 % (ref 11.5–15.5)
WBC: 17 K/uL — ABNORMAL HIGH (ref 4.0–10.5)
nRBC: 0 % (ref 0.0–0.2)

## 2022-10-13 LAB — MRSA NEXT GEN BY PCR, NASAL: MRSA by PCR Next Gen: NOT DETECTED

## 2022-10-13 LAB — GLUCOSE, CAPILLARY
Glucose-Capillary: 247 mg/dL — ABNORMAL HIGH (ref 70–99)
Glucose-Capillary: 232 mg/dL — ABNORMAL HIGH (ref 70–99)
Glucose-Capillary: 193 mg/dL — ABNORMAL HIGH (ref 70–99)
Glucose-Capillary: 207 mg/dL — ABNORMAL HIGH (ref 70–99)
Glucose-Capillary: 210 mg/dL — ABNORMAL HIGH (ref 70–99)
Glucose-Capillary: 183 mg/dL — ABNORMAL HIGH (ref 70–99)
Glucose-Capillary: 199 mg/dL — ABNORMAL HIGH (ref 70–99)

## 2022-10-13 LAB — LIPID PANEL
Cholesterol: 88 mg/dL (ref 0–200)
HDL: 34 mg/dL — ABNORMAL LOW
LDL Cholesterol: 0 mg/dL (ref 0–99)
Total CHOL/HDL Ratio: 2.6 ratio
Triglycerides: 270 mg/dL — ABNORMAL HIGH
VLDL: 54 mg/dL — ABNORMAL HIGH (ref 0–40)

## 2022-10-13 LAB — ECHOCARDIOGRAM COMPLETE BUBBLE STUDY
Area-P 1/2: 3.53 cm2
Height: 67.008 in
S' Lateral: 2.9 cm
Weight: 2134.05 [oz_av]

## 2022-10-13 LAB — MAGNESIUM
Magnesium: 2.3 mg/dL (ref 1.7–2.4)
Magnesium: 2.4 mg/dL (ref 1.7–2.4)
Magnesium: 2.3 mg/dL (ref 1.7–2.4)
Magnesium: 2.2 mg/dL (ref 1.7–2.4)

## 2022-10-13 LAB — PHOSPHORUS
Phosphorus: 4.2 mg/dL (ref 2.5–4.6)
Phosphorus: 3.7 mg/dL (ref 2.5–4.6)

## 2022-10-13 LAB — TRIGLYCERIDES: Triglycerides: 270 mg/dL — ABNORMAL HIGH (ref <150)

## 2022-10-13 MED ORDER — DEXMEDETOMIDINE HCL IN NACL 400 MCG/100ML IV SOLN
0.0000 ug/kg/h | INTRAVENOUS | Status: DC
  Administered 2022-10-13: 0.4 ug/kg/h via INTRAVENOUS
  Administered 2022-10-13: 0.7 ug/kg/h via INTRAVENOUS
  Administered 2022-10-14: 0.6 ug/kg/h via INTRAVENOUS
  Filled 2022-10-13 (×3): qty 100

## 2022-10-13 MED ORDER — NOREPINEPHRINE 4 MG/250ML-% IV SOLN
2.0000 ug/min | INTRAVENOUS | Status: DC
  Administered 2022-10-13: 3 ug/min via INTRAVENOUS
  Filled 2022-10-13: qty 250

## 2022-10-13 MED ORDER — STROKE: EARLY STAGES OF RECOVERY BOOK
Freq: Once | Status: AC
  Filled 2022-10-13: qty 1

## 2022-10-13 MED ORDER — LACTATED RINGERS IV BOLUS
500.0000 mL | Freq: Once | INTRAVENOUS | Status: AC
  Administered 2022-10-13: 500 mL via INTRAVENOUS

## 2022-10-13 MED ORDER — SODIUM CHLORIDE 0.9 % IV SOLN
250.0000 mL | INTRAVENOUS | Status: DC
  Administered 2022-10-13: 250 mL via INTRAVENOUS

## 2022-10-13 MED ORDER — PROSOURCE TF20 ENFIT COMPATIBL EN LIQD
60.0000 mL | Freq: Every day | ENTERAL | Status: DC
  Administered 2022-10-13: 60 mL
  Filled 2022-10-13 (×2): qty 60

## 2022-10-13 MED ORDER — LACTATED RINGERS IV BOLUS
1000.0000 mL | Freq: Once | INTRAVENOUS | Status: AC
  Administered 2022-10-13: 1000 mL via INTRAVENOUS

## 2022-10-13 MED ORDER — PIVOT 1.5 CAL PO LIQD
1000.0000 mL | ORAL | Status: DC
  Administered 2022-10-13: 1000 mL

## 2022-10-13 MED ORDER — VITAL HIGH PROTEIN PO LIQD: 1000.0000 mL | ORAL | Status: DC

## 2022-10-13 NOTE — Progress Notes (Signed)
  Echocardiogram 2D Echocardiogram has been performed.  Delcie Roch 10/13/2022, 11:16 AM

## 2022-10-13 NOTE — Progress Notes (Signed)
EEG complete - results pending.  GMD/NA 

## 2022-10-13 NOTE — Progress Notes (Signed)
RT assisted with transport of this pt from 4N16 to MRI and back while on full ventilatory support. Pt tolerated well with SVS and no complications.

## 2022-10-13 NOTE — Progress Notes (Addendum)
Hypotensive after labetalol & metoprolol HR better , Fluid bolus ongoing Start PIV levo maintain SBP 120 & above Back off on propofol as much as possible   V. Vassie Loll MD

## 2022-10-13 NOTE — Plan of Care (Signed)
  Problem: Clinical Measurements: Goal: Cardiovascular complication will be avoided Outcome: Progressing   Problem: Safety: Goal: Ability to remain free from injury will improve Outcome: Progressing   Problem: Skin Integrity: Goal: Risk for impaired skin integrity will decrease Outcome: Progressing   

## 2022-10-13 NOTE — Progress Notes (Addendum)
STROKE TEAM PROGRESS NOTE   BRIEF HPI [name] is an 67 y.o. male with a past medical history of multiple TIAs, bilateral carotid revascularizations due to carotid artery stenosis bilaterally, AAA, tobacco abuse, COPD, peripheral vascular disease and DM2 who initially presented to the [facility] emergency department for evaluation of sudden onset of right-sided weakness and aphasia. He was intubated for severe agitation to receive TNK and prior to transfer to [facility].    SIGNIFICANT HOSPITAL EVENTS 8/10 Presented to ED, with aphasia and right hemiparesis and received TNK, after teleneurology evaluation transferred to [facility]  INTERIM HISTORY/SUBJECTIVE Today the patient remains intubated and unable to respond to any questions. Per nursing report, he has been less agitated, has been weaned off propofol, started on minimal precedex, and is scheduled to receive MRI at 6p today.    OBJECTIVE  CBC    Component Value Date/Time   WBC 17.0 (H) 10/13/2022 0611   RBC 4.70 10/13/2022 0611   HGB 14.3 10/13/2022 0611   HGB 14.2 08/02/2022 0938   HCT 41.3 10/13/2022 0611   HCT 43.7 08/02/2022 0938   PLT 331 10/13/2022 0611   PLT 326 08/02/2022 0938   MCV 87.9 10/13/2022 0611   MCV 90 08/02/2022 0938   MCH 30.4 10/13/2022 0611   MCHC 34.6 10/13/2022 0611   RDW 12.9 10/13/2022 0611   RDW 15.8 (H) 08/02/2022 0938   LYMPHSABS 1.9 10/12/2022 1802   LYMPHSABS 1.1 08/02/2022 0938   MONOABS 0.7 10/12/2022 1802   EOSABS 0.3 10/12/2022 1802   EOSABS 0.2 08/02/2022 0938   BASOSABS 0.1 10/12/2022 1802   BASOSABS 0.1 08/02/2022 0938    BMET    Component Value Date/Time   NA 136 10/13/2022 0611   NA 138 03/07/2022 1548   K 3.6 10/13/2022 0611   CL 102 10/13/2022 0611   CO2 24 10/13/2022 0611   GLUCOSE 213 (H) 10/13/2022 0611   BUN 11 10/13/2022 0611   BUN 5 (L) 03/07/2022 1548   CREATININE 1.14 10/13/2022 0611   CALCIUM 9.1 10/13/2022 0611   EGFR 98 03/07/2022 1548   GFRNONAA >60  10/13/2022 0611    IMAGING past 24 hours ECHOCARDIOGRAM COMPLETE BUBBLE STUDY  Result Date: 10/13/2022    ECHOCARDIOGRAM REPORT   Patient Name:   Jimmy Duran Date of Exam: 10/13/2022 Medical Rec #:  [id number]        Height:       67.0 in Accession #:    [id number]       Weight:       133.4 lb Date of Birth:  1955-08-21        BSA:          1.702 m Patient Age:    66 years         BP:           85/57 mmHg Patient Gender: M                HR:           76 bpm. Exam Location:  Inpatient Procedure: 2D Echo, Saline Contrast Bubble Study, Cardiac Doppler and Color            Doppler Indications:    stroke  History:        Patient has prior history of Echocardiogram examinations, most                 recent 02/24/2022. COPD; Risk Factors:Hypertension,  Dyslipidemia, Current Smoker and Diabetes.  Sonographer:    Delcie Roch RDCS Referring Phys: [id number]  [id number]   Sonographer Comments: Echo performed with patient supine and on artificial respirator. Image acquisition challenging due to respiratory motion and Image acquisition challenging due to COPD. IMPRESSIONS  1. Left ventricular ejection fraction, by estimation, is 55 to 60%. The left ventricle has normal function. The left ventricle has no regional wall motion abnormalities. There is moderate concentric left ventricular hypertrophy. Left ventricular diastolic parameters are consistent with Grade I diastolic dysfunction (impaired relaxation).  2. Right ventricular systolic function is normal. The right ventricular size is normal.  3. The mitral valve is normal in structure. No evidence of mitral valve regurgitation. No evidence of mitral stenosis.  4. The aortic valve was not well visualized. Aortic valve regurgitation is trivial. No aortic stenosis is present.  5. The inferior vena cava is normal in size with greater than 50% respiratory variability, suggesting right atrial pressure of 3 mmHg.  6. Agitated saline contrast bubble study  was negative, with no evidence of any interatrial shunt. Comparison(s): No significant change from prior study. Prior images reviewed side by side. FINDINGS  Left Ventricle: Left ventricular ejection fraction, by estimation, is 55 to 60%. The left ventricle has normal function. The left ventricle has no regional wall motion abnormalities. The left ventricular internal cavity size was normal in size. There is  moderate concentric left ventricular hypertrophy. Left ventricular diastolic parameters are consistent with Grade I diastolic dysfunction (impaired relaxation). Right Ventricle: The right ventricular size is normal. No increase in right ventricular wall thickness. Right ventricular systolic function is normal. Left Atrium: Left atrial size was normal in size. Right Atrium: Right atrial size was normal in size. Pericardium: Trivial pericardial effusion is present. The pericardial effusion is anterior to the right ventricle. Presence of epicardial fat layer. Mitral Valve: The mitral valve is normal in structure. No evidence of mitral valve regurgitation. No evidence of mitral valve stenosis. Tricuspid Valve: The tricuspid valve is normal in structure. Tricuspid valve regurgitation is not demonstrated. No evidence of tricuspid stenosis. Aortic Valve: The aortic valve was not well visualized. Aortic valve regurgitation is trivial. No aortic stenosis is present. Pulmonic Valve: The pulmonic valve was not well visualized. Pulmonic valve regurgitation is not visualized. No evidence of pulmonic stenosis. Aorta: The aortic root and ascending aorta are structurally normal, with no evidence of dilitation. Venous: The inferior vena cava is normal in size with greater than 50% respiratory variability, suggesting right atrial pressure of 3 mmHg. IAS/Shunts: No atrial level shunt detected by color flow Doppler. Agitated saline contrast was given intravenously to evaluate for intracardiac shunting. Agitated saline contrast  bubble study was negative, with no evidence of any interatrial shunt.  LEFT VENTRICLE PLAX 2D LVIDd:         4.10 cm   Diastology LVIDs:         2.90 cm   LV e' medial:    5.44 cm/s LV PW:         1.30 cm   LV E/e' medial:  12.5 LV IVS:        1.10 cm   LV e' lateral:   7.51 cm/s LVOT diam:     2.10 cm   LV E/e' lateral: 9.0 LV SV:         56 LV SV Index:   33 LVOT Area:     3.46 cm  RIGHT VENTRICLE  IVC RV Basal diam:  2.50 cm     IVC diam: 1.90 cm RV S prime:     11.50 cm/s TAPSE (M-mode): 1.6 cm LEFT ATRIUM             Index        RIGHT ATRIUM           Index LA diam:        3.40 cm 2.00 cm/m   RA Area:     10.00 cm LA Vol (A2C):   36.9 ml 21.68 ml/m  RA Volume:   19.60 ml  11.51 ml/m LA Vol (A4C):   26.5 ml 15.57 ml/m LA Biplane Vol: 31.6 ml 18.56 ml/m  AORTIC VALVE LVOT Vmax:   84.90 cm/s LVOT Vmean:  55.100 cm/s LVOT VTI:    0.163 m  AORTA Ao Root diam: 3.10 cm Ao Asc diam:  3.20 cm MITRAL VALVE MV Area (PHT): 3.53 cm    SHUNTS MV Decel Time: 215 msec    Systemic VTI:  0.16 m MV E velocity: 67.90 cm/s  Systemic Diam: 2.10 cm MV A velocity: 58.60 cm/s MV E/A ratio:  1.16 Riley Lam MD Electronically signed by Riley Lam MD Signature Date/Time: 10/13/2022/11:34:21 AM    Final    EEG adult  Result Date: 10/13/2022 Charlsie Quest, MD     10/13/2022 10:16 AM Patient Name: DAJUAN ELVIN MRN: [id number] Epilepsy Attending: Charlsie Quest Referring Physician/Provider: Mathews Argyle, NP Date: 10/13/2022 Duration: 25.08 mins Patient history: 67yo M with sudden right sided weakness getting eeg to evaluate for seizure. Level of alertness:  lethargic/sedated AEDs during EEG study: Propofol Technical aspects: This EEG study was done with scalp electrodes positioned according to the 10-20 International system of electrode placement. Electrical activity was reviewed with band pass filter of 1-70Hz , sensitivity of 7 uV/mm, display speed of 24mm/sec with a 60Hz  notched filter  applied as appropriate. EEG data were recorded continuously and digitally stored.  Video monitoring was available and reviewed as appropriate. Description: EEG showed continuous generalized and lateralized left hemisphere 3 to 6 Hz theta-delta slowing with overriding 12-13hz  beta activity. Hyperventilation and photic stimulation were not performed.   ABNORMALITY - Continuous slow, generalized and lateralized left hemisphere IMPRESSION: This study is suggestive of cortical dysfunction arising from left hemisphere likely secondary to underlying structural abnormality, post-ictal state. Additionally there is severe diffuse encephalopathy likely related to sedation. No seizures or epileptiform discharges were seen throughout the recording. Charlsie Quest   DG Chest Port 1 View  Result Date: 10/12/2022 CLINICAL DATA:  Intubated EXAM: PORTABLE CHEST 1 VIEW COMPARISON:  10/12/2022 at 2042 hours FINDINGS: Endotracheal tube terminates 3 cm above the carina. Lungs are clear.  No pleural effusion or pneumothorax. The heart is normal in size. Endotracheal tube coursing into the stomach. IMPRESSION: Endotracheal tube terminates 3 cm above the carina. Electronically Signed   By: Charline Bills M.D.   On: 10/12/2022 23:49   CT CHEST ABDOMEN PELVIS WO CONTRAST  Result Date: 10/12/2022 CLINICAL DATA:  Abdominal pain. EXAM: CT CHEST, ABDOMEN AND PELVIS WITHOUT CONTRAST TECHNIQUE: Multidetector CT imaging of the chest, abdomen and pelvis was performed following the standard protocol without IV contrast. RADIATION DOSE REDUCTION: This exam was performed according to the departmental dose-optimization program which includes automated exposure control, adjustment of the mA and/or kV according to patient size and/or use of iterative reconstruction technique. COMPARISON:  Chest CT dated 09/06/2021 and CT abdomen pelvis dated 06/23/2020. FINDINGS: Evaluation  of this exam is limited in the absence of intravenous contrast. CT  CHEST FINDINGS Cardiovascular: There is no cardiomegaly or pericardial effusion. There is 3 vessel coronary vascular calcification. There is moderate atherosclerotic calcification of the thoracic aorta. No aneurysmal dilatation. The central pulmonary arteries are grossly unremarkable. Mediastinum/Nodes: No hilar or mediastinal adenopathy. The esophagus is grossly unremarkable. No mediastinal fluid collection. Lungs/Pleura: Mild centrilobular emphysema. No focal consolidation, pleural effusion, or pneumothorax. Mucus secretions noted in the bilateral mainstem bronchi. The central airways remain patent. Endotracheal tube with tip approximately 2 cm above the carina. Musculoskeletal: No acute osseous pathology. CT ABDOMEN PELVIS FINDINGS No intra-abdominal free air or free fluid. Hepatobiliary: The liver is unremarkable. No biliary dilatation. Cholecystectomy. No retained calcified stone noted in the central CBD. Pancreas: Unremarkable. No pancreatic ductal dilatation or surrounding inflammatory changes. Spleen: Normal in size without focal abnormality. Adrenals/Urinary Tract: Mild bilateral adrenal thickening/hyperplasia versus adenoma. There is a 2 cm right renal upper pole cyst. Mild bilateral hydronephrosis and hydroureter. There is bilateral perinephric stranding, new since the prior CT and nonspecific. Correlation with urinalysis recommended to exclude UTI. Excreted contrast noted in the renal collecting system and ureter. The urinary bladder is distended with excreted contrast. Stomach/Bowel: There is moderate stool throughout the colon. There is sigmoid diverticulosis without active inflammatory changes. There is no bowel obstruction or active inflammation. The appendix is not visualized with certainty. No inflammatory changes identified in the right lower quadrant. Vascular/Lymphatic: Advanced aortoiliac atherosclerotic disease. There is a 2.4 cm infrarenal aortic ectasia. There is a stent in the proximal SMA.  Evaluation of the vasculature is limited in the absence of intravenous contrast. The IVC is unremarkable. No portal venous gas. There is no adenopathy. Reproductive: The prostate and seminal vesicles are grossly unremarkable. No pelvic mass. Other: None Musculoskeletal: Osteopenia with degenerative changes. No acute osseous pathology. IMPRESSION: 1. No acute intrathoracic pathology. 2. Mild bilateral hydronephrosis and hydroureter with bilateral perinephric stranding. Correlation with urinalysis recommended to exclude UTI. 3. Sigmoid diverticulosis. No bowel obstruction. 4. Aortic Atherosclerosis (ICD10-I70.0) and Emphysema (ICD10-J43.9). Electronically Signed   By: Elgie Collard M.D.   On: 10/12/2022 21:00   DG Chest Portable 1 View  Result Date: 10/12/2022 CLINICAL DATA:  Status post intubation EXAM: PORTABLE CHEST 1 VIEW COMPARISON:  Chest radiograph dated 02/23/2022 FINDINGS: Lines/tubes: Endotracheal tube tip projects 4.2 cm above the carina. Lungs: Lungs are clear without focal consolidation. Pleura: No pneumothorax or pleural effusion. Bilateral costophrenic angles are not included within the field of view. Heart/mediastinum: The heart size and mediastinal contours are within normal limits. Bones: No acute osseous abnormality. IMPRESSION: 1. Endotracheal tube tip projects 4.2 cm above the carina. 2. No focal consolidations. Electronically Signed   By: Agustin Cree M.D.   On: 10/12/2022 20:53   CT Head Wo Contrast  Result Date: 10/12/2022 CLINICAL DATA:  Neuro deficit, acute, stroke suspected post TNK mental changes EXAM: CT HEAD WITHOUT CONTRAST TECHNIQUE: Contiguous axial images were obtained from the base of the skull through the vertex without intravenous contrast. RADIATION DOSE REDUCTION: This exam was performed according to the departmental dose-optimization program which includes automated exposure control, adjustment of the mA and/or kV according to patient size and/or use of iterative  reconstruction technique. COMPARISON:  CT head 10/12/2022, CT head 01/04/2014 FINDINGS: Brain: Patchy and confluent areas of decreased attenuation are noted throughout the deep and periventricular white matter of the cerebral hemispheres bilaterally, compatible with chronic microvascular ischemic disease. Left parietal encephalomalacia. No evidence of large-territorial  acute infarction. No parenchymal hemorrhage. No mass lesion. No extra-axial collection. No mass effect or midline shift. No hydrocephalus. Basilar cisterns are patent. Vascular: No hyperdense vessel. Atherosclerotic calcifications are present within the cavernous internal carotid and vertebral arteries. Skull: No acute fracture or focal lesion. Sinuses/Orbits: Paranasal sinuses and mastoid air cells are clear. Bilateral lens replacement. Otherwise the orbits are unremarkable. Other: None. IMPRESSION: No acute intracranial abnormality. Electronically Signed   By: Tish Frederickson M.D.   On: 10/12/2022 20:35   CT ANGIO HEAD NECK W WO CM (CODE STROKE)  Result Date: 10/12/2022 CLINICAL DATA:  Code stroke EXAM: CT ANGIOGRAPHY HEAD AND NECK WITH AND WITHOUT CONTRAST TECHNIQUE: Multidetector CT imaging of the head and neck was performed using the standard protocol during bolus administration of intravenous contrast. Multiplanar CT image reconstructions and MIPs were obtained to evaluate the vascular anatomy. Carotid stenosis measurements (when applicable) are obtained utilizing NASCET criteria, using the distal internal carotid diameter as the denominator. RADIATION DOSE REDUCTION: This exam was performed according to the departmental dose-optimization program which includes automated exposure control, adjustment of the mA and/or kV according to patient size and/or use of iterative reconstruction technique. CONTRAST:  75mL OMNIPAQUE IOHEXOL 350 MG/ML SOLN COMPARISON:  Same-day noncontrast head CT, CTA neck 09/10/2021, MRA head 01/04/2014 FINDINGS: CTA NECK  FINDINGS Aortic arch: There is mild calcified plaque in the imaged aortic arch. The origins of the major branch vessels are patent. The subclavian arteries are patent to the level imaged with mild stenosis proximally on the right. Right carotid system: The right common, internal, and external carotid arteries are patent, without hemodynamically significant stenosis or occlusion. There is no evidence of dissection or aneurysm. Left carotid system: The left common carotid artery is patent with mild calcified plaque but no significant stenosis or occlusion. A stent is seen traversing the distal common carotid artery through the internal carotid artery. There is narrowing of the stent at the level of the bifurcation due to significant eccentric plaque resulting in approximately 75% stenosis, though measurement is difficult due to streak artifact from the stent. The internal carotid artery distal to the stent is widely patent. The external carotid artery is stenotic at its origin but patent. There is no evidence of dissection or aneurysm. Vertebral arteries: There is severe stenosis of the left vertebral artery at its origin. There is moderate stenosis of the right vertebral artery at its origin. The remainder of the vertebral arteries are patent, without other hemodynamically significant stenosis or occlusion. There is no evidence of dissection or aneurysm. Skeleton: There is degenerative change most advanced at C4-C5 and C5-C6 with reversal of the normal curvature. There is no acute osseous abnormality or suspicious osseous lesion. There is no visible canal hematoma. Other neck: The soft tissues of the neck are unremarkable. Upper chest: There is mild emphysema in the lung apices. There is debris in the trachea. Review of the MIP images confirms the above findings CTA HEAD FINDINGS Anterior circulation: There is calcified plaque in the intracranial ICAs resulting in severe stenosis of the right cavernous segment and  left supraclinoid segment. The ICA termini are patent. The bilateral MCAs and ACAS are patent, without proximal stenosis or occlusion. The anterior communicating artery is normal. There is no aneurysm or AVM. Posterior circulation: There is calcified plaque in the bilateral V4 segments resulting in mild stenosis bilaterally. The basilar artery is patent with mild atherosclerotic plaque. The major cerebellar arteries appear patent. The bilateral PCAs are patent, with mild atherosclerotic  irregularity but no proximal high-grade stenosis or occlusion. A small right posterior communicating artery is identified. There is no aneurysm or AVM. Venous sinuses: Patent. Anatomic variants: None. Review of the MIP images confirms the above findings IMPRESSION: 1. No emergent vascular finding. 2. Patent stent in the left common and internal carotid arteries with approximately 75% stenosis at the level of the bifurcation due to significant eccentric plaque. The internal carotid artery distal to the stent is widely patent. 3. Severe left and moderate right vertebral artery origin stenosis. 4. Calcified plaque in the intracranial ICAs resulting in severe stenosis of the right cavernous segment and left supraclinoid segment. Otherwise, patent intracranial vasculature with mild atherosclerotic irregularity but no other high-grade stenosis or occlusion. 5. Debris in the trachea. Correlate with any signs or symptoms of aspiration. 6. Mild emphysema. These results were called by telephone at the time of interpretation on 10/12/2022 at 6:27 pm to provider Dr Wilford Corner, who verbally acknowledged these results. Electronically Signed   By: Lesia Hausen M.D.   On: 10/12/2022 18:33   CT HEAD CODE STROKE WO CONTRAST  Result Date: 10/12/2022 CLINICAL DATA:  Code stroke. Neuro deficit, acute, stroke suspected. Confusion. Aphasia. Weakness in the upper extremities bilaterally. Last known well at 1530 hours. EXAM: CT HEAD WITHOUT CONTRAST TECHNIQUE:  Contiguous axial images were obtained from the base of the skull through the vertex without intravenous contrast. RADIATION DOSE REDUCTION: This exam was performed according to the departmental dose-optimization program which includes automated exposure control, adjustment of the mA and/or kV according to patient size and/or use of iterative reconstruction technique. COMPARISON:  CT head contrast 01/04/2014 FINDINGS: Brain: Cortical and subcortical white matter hypoattenuation is present in the high left parietal lobe without significant volume loss. Mild generalized atrophy is present. Moderate periventricular and diffuse subcortical white matter hypoattenuation is present bilaterally. No acute other cortical infarct is present. Insular ribbon is normal bilaterally. Basal ganglia are intact. The ventricles are of normal size. No significant extraaxial fluid collection is present. The brainstem and cerebellum are within normal limits. Midline structures are within normal limits. Vascular: Atherosclerotic calcifications are present within the cavernous internal carotid arteries and at the dural margin of both vertebral arteries. Skull: Calvarium is intact. No focal lytic or blastic lesions are present. No significant extracranial soft tissue lesion is present. Sinuses/Orbits: The paranasal sinuses and mastoid air cells are clear. The globes and orbits are within normal limits. ASPECTS [facility] Stroke Program Early CT Score) - Ganglionic level infarction (caudate, lentiform nuclei, internal capsule, insula, M1-M3 cortex): 6/7 - Supraganglionic infarction (M4-M6 cortex): 3/3 Total score (0-10 with 10 being normal): 9/10 IMPRESSION: 1. Cortical and subcortical white matter hypoattenuation in the high left parietal lobe without significant volume loss. This is concerning for an acute or subacute infarct. 2. No other acute cortical infarct. 3. Moderate periventricular and diffuse subcortical white matter hypoattenuation  bilaterally. This likely reflects the sequela of chronic microvascular ischemia. 4. Aspects is 9/10. These results were called by telephone at the time of interpretation on 10/12/2022 at 6:01 pm to provider [facility] , who verbally acknowledged these results. Electronically Signed   By: Marin Roberts M.D.   On: 10/12/2022 18:01    Vitals:   10/13/22 1000 10/13/22 1126 10/13/22 1200 10/13/22 1300  BP: 121/75 110/62 (!) 88/59 115/70  Pulse: 81 75 74 69  Resp: 16 18 18 17   Temp:   98.7 F (37.1 C)   TempSrc:   Oral   SpO2: 98%  96% 95%  Weight:      Height:         PHYSICAL EXAM General:  Intubated. Sedated.  CV: normal rate and rhythm.  Respiratory: synchronous with ventilator.    NEURO:  Mental Status: Patient is intubated and neurological exam is limited due to sedation but he is responsive only to pain stimuli.  Speech/Language: Unable to assess speech.  Cranial Nerves:  II: PERRL. Right hemianopsia as evident by no blink reflex with right threatening stimulus.  III, IV, VI: Unable to assess EOM. Eyelids elevate symmetrically to painful stimuli.  V: Unable to assess facial sensation.  VII: Face is symmetrical at rest.  VIII: Unable to assess hearing or balance. IX, X: Unable to assess palate or phonation.  XI: Unable to assess. XII: Unable to assess tongue for deviation. Motor: Per nursing, uses arms symmetrically and no signs of weakness with spontaneous movements or pushing self up in bed.  Tone: is normal and bulk is normal. Sensation- Intact to light touch bilaterally. Extinction absent to light touch to DSS.   Coordination: FTN intact bilaterally, HKS: no ataxia in BLE.No drift.  Gait- deferred   ASSESSMENT/PLAN  Acute Ischemic Infarct:  left superior parietal lobe s/p TNK Etiology:  likely thromboembolism from left common/internal carotid plaque Code Stroke CT head: Cortical and subcortical white matter hypoattenuation in the high left parietal lobe  without significant volume loss.Small vessel disease. ASPECTS 9.   CTA head & neck: Left CC IC arteries with patent stent and 75% stenosis at level of bifurcation with significant eccentric plaque. Left IC distal to stent is patent. 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 Calcified plaque in the intracranial ICAs resulting in severe stenosis of the right cavernous segment and left supraclinoid segment. MRI  pending  Carotid Doppler (bilateral)  pending 2D Echo 50-60%. No evident of PFO. No comment on source of embolism.  LDL 0 HgbA1c 8.9 VTE prophylaxis - SCDs aspirin 81 mg daily and clopidogrel 75 mg daily prior to admission, now on No antithrombotic s/p TNK. DAPT pending MRI findings.  Therapy recommendations:  pending Disposition:  pending  Hx of Stroke/TIA 01/2014 TIA: R facial numbness 10/2019 vascular surgery note reports intermittent L hand numbness prior to right CEA.  No other documented ED/hosp notes for stroke/TIA  Hypertension Home meds:  lisinopril 20 mg Unstable, currently hypotensive, requiring intermittent levophed Blood Pressure Goal: BP less than 180/105   Hyperlipidemia Home meds:  rosuvastatin 40 mg once daily and fenofibrate 145 mg once daily, resumed in hospital LDL 0 (??), goal < 70 Repeating fasting lipid panel Continue statin at discharge  Diabetes type II Uncontrolled Home meds:  metformin 1000 mg twice daily, semaglutide 0.25 once weekly, novolog 14 units three times daily with meals, novolog restarted at 7 units three times daily due to NPO currently.  HgbA1c pending (8.9 in May), goal < 7.0 Recommend close follow-up with PCP for better DM control  Past Tobacco Abuse Patient smokes 0.5 packs per day for 30 years Quit in 2020 Nicotine replacement therapy provided  Dysphagia Patient has post-stroke dysphagia, SLP consulted    Diet   Diet NPO time specified   Advance diet as tolerated  Other Stroke Risk Factors ETOH use, alcohol  level <10, advised to drink no more than 2 drinks a day Coronary artery disease   Other Active Problems Acute Respiratory Insufficiency, plan for SBT tmrw morning following MRI today Sedation-related Hypotension, currently stable and weaning sedation Leukocytosis, WBC 6.7 --> 17.0,  afebrile, negative CXR yesterday, will continue trending Hypokalemia, resolved at 3.6 today, daily BMPs CCM physician following above issues.   Hospital day # 1  Meryl Dare, MD PGY-1 Psychiatry Resident 10/13/2022, 3:18 PM   STROKE MD NOTE :  I have personally obtained history,examined this patient, reviewed notes, independently viewed imaging studies, participated in medical decision making and plan of care.ROS completed by me personally and pertinent positives fully documented  I have made any additions or clarifications directly to the above note. Agree with note above.  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  Received IV TNK but remains intubated and sedated and neurological exam is limited.  Recommend close neurological observation and strict blood pressure control as per postthrombolytic protocol.  Check 24-hour brain imaging.  Extubated as tolerated per critical care team.  Continue ongoing stroke workup.  No family available at the bedside for discussion.  Discussed with critical care team.This patient is critically ill and at significant risk of neurological worsening, death and care requires constant monitoring of vital signs, hemodynamics,respiratory and cardiac monitoring, extensive review of multiple databases, frequent neurological assessment, discussion with family, other specialists and medical decision making of high complexity.I have made any additions or clarifications directly to the above note.This critical care time does not reflect procedure time, or teaching time or supervisory time of PA/NP/Med Resident etc but could involve  care discussion time.  I spent 30 minutes of neurocritical care time  in the care of  this patient.      Delia Heady, MD Medical Director [facility] Pager: [phone] 10/13/2022 3:25 PM   To contact Stroke Continuity provider, please refer to [email or web address]. After hours, contact General Neurology

## 2022-10-13 NOTE — Consult Note (Signed)
NAME:  Jimmy Duran, MRN:  [id number], DOB:  [birth year], LOS: 1 ADMISSION DATE:  10/12/2022, CONSULTATION DATE:  10/13/2022  REFERRING MD:  Otelia Limes, Neuro, CHIEF COMPLAINT:  stroke, intubated   History of Present Illness:  67 year old man BIBEMS to [name], ED as a code stroke LKW 3:30 PM presented with abnormal speech and weakness on the right Head CT was negative, seen by telestroke, TNK was administered 1831 slight delay due to hypertension requiring labetalol and Cleviprex drip He was intubated for psychomotor agitation and transferred to [facility].  He was placed on propofol and Cleviprex drips Post TNK head CT was negative CT chest/abdomen/pelvis showed mild bilateral hydronephrosis and hydroureter PCCM asked to assist with management of ventilator, blood pressure and SVT.    Pertinent  Medical History  Type 2 diabetes Hypertension TIAs Bilateral carotid artery stenosis PAD AAA  Significant Hospital Events: Including procedures, antibiotic start and stop dates in addition to other pertinent events   8/10 Admitted for stroke. S/p TNK  Interim History / Subjective:   Hypotensive overnight. Cleviprex d/c'd. Required low dose levophed  Objective   Blood pressure (!) 86/55, pulse 83, temperature 97.6 F (36.4 C), temperature source Axillary, resp. rate 18, height 5' 7.01" (1.702 m), weight 60.5 kg, SpO2 97%.    Vent Mode: PSV;CPAP FiO2 (%):  [30 %-50 %] 30 % Set Rate:  [15 bmp-20 bmp] 15 bmp Vt Set:  [540 mL] 540 mL PEEP:  [5 cmH20] 5 cmH20 Pressure Support:  [8 cmH20] 8 cmH20 Plateau Pressure:  [13 cmH20-14 cmH20] 13 cmH20   Intake/Output Summary (Last 24 hours) at 10/13/2022 0845 Last data filed at 10/13/2022 0600 Gross per 24 hour  Intake 1030.6 ml  Output 1300 ml  Net -269.4 ml   Filed Weights   10/12/22 1800  Weight: 60.5 kg   Physical Exam: General: Elderly-appearing, no acute distress, encephalopathic HENT: [place], AT, ETT in place Eyes: EOMI, no  scleral icterus Respiratory: Diminished with scattered rhonchi to auscultation bilaterally.  No wheezing  Cardiovascular: RRR, -M/R/G, no JVD GI: BS+, soft, nontender Extremities:-Edema,-tenderness Neuro: Eyes open, intermittent tracking, intermittent right neglect, grimaces and withdraws to lower extremity noxious stimuli  WBC 17  Resolved Hospital Problem list     Assessment & Plan:  Acute respiratory insufficiency post CVA Intubated for psychomotor agitation to facilitate imaging Possible underlying COPD, no PFTs available -Full vent support -LTVV, 4-8cc/kg IBW with goal Pplat<30 and DP<15 -Transition from propofol to Precedex with intermittent fentanyl for sedation, goal RASS 0 to -1 -SBT/WUA daily. Hold on extubation until MRI completed -PRN xopenex  Sedation/medication related hypotension.  -Cleviprex discontinued d/t hypotension -Wean levophed for MAP >65 -Leukocytosis however afebrile. Monitor fever/WBC curve  Hypertension SVT -Cleviprex discontinued d/t hypotension -Hold home anti-hypertesnive agents  Acute CVA -status post TNK -Per neurology -MRI planned -Aspirin to start after 24 hours, holding Plavix -Echo planned  Type 2 diabetes - Will give half home dose of Lantus 12 units while n.p.o. SSI  Mild hydronephrosis -check UA Correlate with prostatism  Best Practice (right click and "Reselect all SmartList Selections" daily)   Diet/type: tubefeeds DVT prophylaxis: SCD GI prophylaxis: H2B Lines: N/A Foley:  N/A Code Status:  full code Last date of multidisciplinary goals of care discussion [NA]   Critical care time: 50m    Mechele Collin, M.D. [facility] Pulmonary/Critical Care Medicine 10/13/2022 8:45 AM   See Amion for personal pager For hours between 7 PM to 7 AM, please call Elink for urgent questions  10/13/2022     

## 2022-10-13 NOTE — Procedures (Signed)
Patient Name: [name]  MRN: [id number]  Epilepsy Attending: Charlsie Quest  Referring Physician/Provider: Mathews Argyle, NP  Date: 10/13/2022 Duration: 25.08 mins  Patient history: 67yo M with sudden right sided weakness getting eeg to evaluate for seizure.   Level of alertness:  lethargic/sedated  AEDs during EEG study: Propofol  Technical aspects: This EEG study was done with scalp electrodes positioned according to the 10-20 International system of electrode placement. Electrical activity was reviewed with band pass filter of 1-70Hz , sensitivity of 7 uV/mm, display speed of 37mm/sec with a 60Hz  notched filter applied as appropriate. EEG data were recorded continuously and digitally stored.  Video monitoring was available and reviewed as appropriate.  Description: EEG showed continuous generalized and lateralized left hemisphere 3 to 6 Hz theta-delta slowing with overriding 12-13hz  beta activity. Hyperventilation and photic stimulation were not performed.     ABNORMALITY - Continuous slow, generalized and lateralized left hemisphere   IMPRESSION: This study is suggestive of cortical dysfunction arising from left hemisphere likely secondary to underlying structural abnormality, post-ictal state. Additionally there is severe diffuse encephalopathy likely related to sedation. No seizures or epileptiform discharges were seen throughout the recording.   Jimmy Duran

## 2022-10-13 NOTE — Progress Notes (Addendum)
MRI called RN and stated they would not be able to take patient for MRI until likely after shift change at 1900. Per Dr. Pearlean Brownie, ok to wait until after 24 hours for MRI and no order to be placed for CT to take place earlier than that. Will pass on to oncoming nurse. MRI to call with availability.  1800 MRI notified RN of availability at 1730. RN and RT transported patient to and from MRI with no complications. Fentanyl administered during exam for agitation, patient tolerated well.

## 2022-10-13 NOTE — Progress Notes (Signed)
eLink Physician-Brief Progress Note Patient Name: Jimmy Duran DOB: April 18, 1955 MRN: [id number]   Date of Service  10/13/2022  HPI/Events of Note  hypotension  eICU Interventions  Fluid bolus LR 500 cc IV     Intervention Category Major Interventions: Hypotension - evaluation and management  Henry Russel, P 10/13/2022, 12:57 AM

## 2022-10-14 ENCOUNTER — Inpatient Hospital Stay (HOSPITAL_COMMUNITY): Payer: Medicare PPO

## 2022-10-14 ENCOUNTER — Encounter (HOSPITAL_COMMUNITY): Payer: Self-pay | Admitting: Neurology

## 2022-10-14 DIAGNOSIS — I639 Cerebral infarction, unspecified: Secondary | ICD-10-CM | POA: Diagnosis not present

## 2022-10-14 DIAGNOSIS — I771 Stricture of artery: Secondary | ICD-10-CM

## 2022-10-14 DIAGNOSIS — E43 Unspecified severe protein-calorie malnutrition: Secondary | ICD-10-CM | POA: Insufficient documentation

## 2022-10-14 LAB — GLUCOSE, CAPILLARY
Glucose-Capillary: 146 mg/dL — ABNORMAL HIGH (ref 70–99)
Glucose-Capillary: 165 mg/dL — ABNORMAL HIGH (ref 70–99)
Glucose-Capillary: 200 mg/dL — ABNORMAL HIGH (ref 70–99)
Glucose-Capillary: 201 mg/dL — ABNORMAL HIGH (ref 70–99)
Glucose-Capillary: 205 mg/dL — ABNORMAL HIGH (ref 70–99)
Glucose-Capillary: 228 mg/dL — ABNORMAL HIGH (ref 70–99)

## 2022-10-14 LAB — LIPID PANEL
Cholesterol: 85 mg/dL (ref 0–200)
HDL: 35 mg/dL — ABNORMAL LOW (ref >40)
LDL Cholesterol: 8 mg/dL (ref 0–99)
Total CHOL/HDL Ratio: 2.4 RATIO
Triglycerides: 212 mg/dL — ABNORMAL HIGH (ref <150)
VLDL: 42 mg/dL — ABNORMAL HIGH (ref 0–40)

## 2022-10-14 MED ORDER — ENSURE ENLIVE PO LIQD: 237.0000 mL | Freq: Two times a day (BID) | ORAL | Status: DC

## 2022-10-14 MED ORDER — METHYLPREDNISOLONE SODIUM SUCC 40 MG IJ SOLR
40.0000 mg | Freq: Two times a day (BID) | INTRAMUSCULAR | Status: DC
  Administered 2022-10-14 – 2022-10-15 (×3): 40 mg via INTRAVENOUS
  Filled 2022-10-14 (×3): qty 1

## 2022-10-14 MED ORDER — LABETALOL HCL 5 MG/ML IV SOLN: 10.0000 mg | INTRAVENOUS | Status: DC | PRN

## 2022-10-14 MED ORDER — DOXYCYCLINE HYCLATE 100 MG PO TABS
100.0000 mg | ORAL_TABLET | Freq: Two times a day (BID) | ORAL | Status: DC
  Administered 2022-10-14 – 2022-10-15 (×3): 100 mg via ORAL
  Filled 2022-10-14 (×4): qty 1

## 2022-10-14 MED ORDER — CLOPIDOGREL BISULFATE 75 MG PO TABS
75.0000 mg | ORAL_TABLET | Freq: Every day | ORAL | Status: DC
  Administered 2022-10-14 – 2022-10-15 (×2): 75 mg via ORAL
  Filled 2022-10-14 (×2): qty 1

## 2022-10-14 MED ORDER — ASPIRIN 81 MG PO TBEC
81.0000 mg | DELAYED_RELEASE_TABLET | Freq: Every day | ORAL | Status: DC
  Administered 2022-10-14 – 2022-10-15 (×2): 81 mg via ORAL
  Filled 2022-10-14 (×2): qty 1

## 2022-10-14 MED ORDER — ROSUVASTATIN CALCIUM 20 MG PO TABS
40.0000 mg | ORAL_TABLET | Freq: Every day | ORAL | Status: DC
  Administered 2022-10-15: 40 mg via ORAL
  Filled 2022-10-14: qty 2

## 2022-10-14 MED ORDER — HYDRALAZINE HCL 20 MG/ML IJ SOLN
10.0000 mg | INTRAMUSCULAR | Status: DC | PRN
  Administered 2022-10-15: 20 mg via INTRAVENOUS
  Filled 2022-10-14: qty 1

## 2022-10-14 MED ORDER — ENOXAPARIN SODIUM 40 MG/0.4ML IJ SOSY
40.0000 mg | PREFILLED_SYRINGE | INTRAMUSCULAR | Status: DC
  Administered 2022-10-14 – 2022-10-15 (×2): 40 mg via SUBCUTANEOUS
  Filled 2022-10-14 (×2): qty 0.4

## 2022-10-14 MED ORDER — LISINOPRIL 20 MG PO TABS
20.0000 mg | ORAL_TABLET | Freq: Every day | ORAL | Status: DC
  Administered 2022-10-14 – 2022-10-15 (×2): 20 mg via ORAL
  Filled 2022-10-14 (×2): qty 1

## 2022-10-14 MED ORDER — METOPROLOL TARTRATE 25 MG PO TABS
25.0000 mg | ORAL_TABLET | Freq: Two times a day (BID) | ORAL | Status: DC
  Administered 2022-10-15: 25 mg via ORAL
  Filled 2022-10-14: qty 1

## 2022-10-14 MED ORDER — LACTATED RINGERS IV BOLUS
1000.0000 mL | Freq: Once | INTRAVENOUS | Status: AC
  Administered 2022-10-14: 1000 mL via INTRAVENOUS

## 2022-10-14 NOTE — Procedures (Signed)
Extubation Procedure Note  Patient Details:   Name: Jimmy Duran DOB: January 23, 1956 MRN: [id number]   Airway Documentation:    Vent end date: 10/14/22 Vent end time: 1055   Evaluation  O2 sats: stable throughout Complications: No apparent complications Patient did tolerate procedure well. Bilateral Breath Sounds: Rhonchi, Diminished   Yes,  Per CCM order, RT extubated pt to [place]. Prior to extubation pt did have a positive cuff leak. Pt tolerated well with SVS, no stridor noted and no complications. Pt was able to state his name after extubation. RN currently at bedside.  Megan Mans 10/14/2022, 10:58 AM

## 2022-10-14 NOTE — Progress Notes (Addendum)
NAME:  Jimmy Duran, MRN:  [id number], DOB:  December 26, 1955, LOS: 2 ADMISSION DATE:  10/12/2022, CONSULTATION DATE:  10/14/2022  REFERRING MD:  Otelia Limes, Neuro, CHIEF COMPLAINT:  stroke, intubated   History of Present Illness:  67 year old man BIBEMS to [name], ED 10/12/2022  as a code stroke LKW 3:30 PM presented with abnormal speech and weakness on the right Head CT was negative, seen by telestroke, TNK was administered 1831 slight delay due to hypertension requiring labetalol and Cleviprex drip He was intubated for psychomotor agitation and transferred to [facility].  He was placed on propofol and Cleviprex drips Post TNK head CT was negative CT chest/abdomen/pelvis showed mild bilateral hydronephrosis and hydroureter PCCM asked to assist with management of ventilator, blood pressure and SVT.    Pertinent  Medical History  Type 2 diabetes Hypertension TIAs Bilateral carotid artery stenosis PAD AAA  Significant Hospital Events: Including procedures, antibiotic start and stop dates in addition to other pertinent events   8/10 Admitted for stroke. S/p TNK  Interim History / Subjective:  Oliguria over night  Off Cleviprex Dex off at 9 am. Awake, alert, following commands Looks good for extubation  Found with feeding tune almost out. Nursing had stopped TF.  Will check CXR to eval for aspiration   Objective   Blood pressure (!) 142/76, pulse 64, temperature 98.4 F (36.9 C), temperature source Axillary, resp. rate 15, height 5' 7.01" (1.702 m), weight 57.4 kg, SpO2 96%.    Vent Mode: PRVC FiO2 (%):  [30 %-40 %] 40 % Set Rate:  [15 bmp] 15 bmp Vt Set:  [530 mL-5390 mL] 530 mL PEEP:  [5 cmH20] 5 cmH20 Plateau Pressure:  [7 cmH20-15 cmH20] 15 cmH20   Intake/Output Summary (Last 24 hours) at 10/14/2022 0955 Last data filed at 10/14/2022 0600 Gross per 24 hour  Intake 2754.01 ml  Output 410 ml  Net 2344.01 ml   Filed Weights   10/12/22 1800 10/14/22 0358  Weight:  60.5 kg 57.4 kg   Physical Exam: General: Elderly-appearing, no acute distress, awake and following commands HENT: [place], AT, ETT in place, OG tube almost out, TF off Eyes: EOMI, no scleral icterus, PERRLA Respiratory: Bilateral excursion  with scattered rhonchi,   No wheezing  Cardiovascular: RRR, -M/R/G, no JVD GI: BS+, soft, nontender Extremities:-Edema,-tenderness Neuro: Eyes open, tracking, no neglect, awake and alert, following commands  Labs reviewed 8/12 WBC 11.1, HGB 12.5, platelets 237 Na 135, K 3.5, BUN 19, Creatinine 0.89 Mag 2.1, Phos 3.3, Triglycerides 212 , HDL 35,  T Max 99.6 Net + 2 L since admission   Resolved Hospital Problem list     Assessment & Plan:  Acute respiratory insufficiency No CVA, most likely seizures.  Now awake and alert History of COPD ( 1-2 PPD x 54 years), with ? Current flare Intubated for psychomotor agitation to facilitate imaging Possible underlying COPD, no PFTs available -Plan to extubate today -LTVV, 4-8cc/kg IBW with goal Pplat<30 and DP<15 - sedation currently off -SBT/WUA daily. MRI completed and negative for stroke -PRN xopenex Solumedrol 40 mg BID first dose now for ? flare Doxycycline 100 mg BID x 7 days CXR to check for aspiration   Sedation/medication related hypotension.  -Cleviprex discontinued   -levophed off -Leukocytosis , low grade fever Monitor fever/WBC curve  Hypertension SVT -Cleviprex discontinued d/t hypotension -Hold home anti-hypertesnive agents  Acute CVA - MRI negative, suspect seizure activity ? If seizures -Per neurology -MRI negative - Overnight EEG to better eval for  seizure activity -Aspirin to start after 24 hours, holding Plavix -Echo  shows EF 55-60%, LV Normal function  LV hypertrophy, Grade 1 Diastolic Dysfunction No Mitral regurg Trivial aortic regurg, no stenosis  Type 2 diabetes - Will give half home dose of Lantus 12 units while n.p.o. SSI  Nutrition Plan' Will start diet once  swallow passed  Mild hydronephrosis -check UA Correlate with prostatism  Best Practice (right click and "Reselect all SmartList Selections" daily)   Diet/type: tubefeeds DVT prophylaxis: SCD GI prophylaxis: H2B Lines: N/A Foley:  N/A Code Status:  full code Last date of multidisciplinary goals of care discussion [NA]   Critical care time: 87m    Bevelyn Ngo, MSN, AGACNP-BC [place] Pulmonary/Critical Care Medicine See Amion for personal pager PCCM on call pager [phone] 10/14/2022 9:55 AM   See Amion for personal pager For hours between 7 PM to 7 AM, please call Elink for urgent questions  10/14/2022

## 2022-10-14 NOTE — Plan of Care (Signed)
  Problem: Health Behavior/Discharge Planning: Goal: Ability to manage health-related needs will improve Outcome: Progressing   Problem: Clinical Measurements: Goal: Ability to maintain clinical measurements within normal limits will improve Outcome: Progressing Goal: Will remain free from infection Outcome: Progressing Goal: Diagnostic test results will improve Outcome: Progressing   

## 2022-10-14 NOTE — Progress Notes (Signed)
eLink Physician-Brief Progress Note Patient Name: Jimmy Duran DOB: August 28, 1955 MRN: [id number]   Date of Service  10/14/2022  HPI/Events of Note  Oliguric 200 cc/h 8 hours Repeat bolus from yesterday Tube feeds ordered yesterday  eICU Interventions  LR 1L bolus ordered     Intervention Category Intermediate Interventions: Oliguria - evaluation and management   Mechele Collin 10/14/2022, 4:02 AM

## 2022-10-14 NOTE — Progress Notes (Addendum)
STROKE TEAM PROGRESS NOTE   BRIEF HPI Jimmy Duran is an 67 y.o. male with a past medical history of multiple TIAs, bilateral carotid revascularizations due to carotid artery stenosis bilaterally, AAA, tobacco abuse, COPD, peripheral vascular disease and DM2 who initially presented to the [facility] emergency department for evaluation of sudden onset of right-sided weakness and aphasia. NIHSS 14. He was intubated for severe agitation to receive TNK and prior to transfer to [facility].    SIGNIFICANT HOSPITAL EVENTS 8/10 Presented to ED, with aphasia and right hemiparesis and received TNK, after teleneurology evaluation transferred to [facility]  INTERIM HISTORY/SUBJECTIVE Patient is off sedation and awake today and was successfully extubated. Pt reports that he does not have any current concerns. When asked what brought him into the hospital, he reports that he has no memory of what happened other than walking into the backyard.  He currently reports no focal weakness, speech issues, or visual changes. He reports that he has not had any seizures in the past. Reports that his sister has epilepsy. He reports having had "mini strokes" in the past. He gave permission to speak with his wife. Informed the patient that we are concerned about a seizure causing the current episode more than a stroke.   Collateral (wife, [phone]): Started getting lightheaded when walking to backyard, reported right side weakness, and facial dropping. She said he looked like he was "possessed." He then became severely agitated. She does not report that the patient shaking. She reports that he has never experienced this in the past.   OBJECTIVE  CBC    Component Value Date/Time   WBC 11.1 (H) 10/14/2022 0523   RBC 4.02 (L) 10/14/2022 0523   HGB 12.5 (L) 10/14/2022 0523   HGB 14.2 08/02/2022 0938   HCT 37.4 (L) 10/14/2022 0523   HCT 43.7 08/02/2022 0938   PLT 237 10/14/2022 0523   PLT 326 08/02/2022 0938   MCV 93.0  10/14/2022 0523   MCV 90 08/02/2022 0938   MCH 31.1 10/14/2022 0523   MCHC 33.4 10/14/2022 0523   RDW 13.4 10/14/2022 0523   RDW 15.8 (H) 08/02/2022 0938   LYMPHSABS 1.9 10/12/2022 1802   LYMPHSABS 1.1 08/02/2022 0938   MONOABS 0.7 10/12/2022 1802   EOSABS 0.3 10/12/2022 1802   EOSABS 0.2 08/02/2022 0938   BASOSABS 0.1 10/12/2022 1802   BASOSABS 0.1 08/02/2022 0938    BMET    Component Value Date/Time   NA 135 10/14/2022 0523   NA 138 03/07/2022 1548   K 3.5 10/14/2022 0523   CL 100 10/14/2022 0523   CO2 25 10/14/2022 0523   GLUCOSE 214 (H) 10/14/2022 0523   BUN 19 10/14/2022 0523   BUN 5 (L) 03/07/2022 1548   CREATININE 0.89 10/14/2022 0523   CALCIUM 8.8 (L) 10/14/2022 0523   EGFR 98 03/07/2022 1548   GFRNONAA >60 10/14/2022 0523    IMAGING past 24 hours DG CHEST PORT 1 VIEW  Result Date: 10/14/2022 CLINICAL DATA:  Aspiration EXAM: PORTABLE CHEST 1 VIEW COMPARISON:  Chest x-ray dated October 12, 2022 FINDINGS: Interval removal of enteric tube. Stable position of ETT. Lungs are clear. Evidence of pleural effusion or pneumothorax. IMPRESSION: Lungs are clear. Electronically Signed   By: Allegra Lai M.D.   On: 10/14/2022 13:47   VAS US CAROTID  Result Date: 10/14/2022 Carotid Arterial Duplex Study Patient Name:  Jimmy Duran  Date of Exam:   10/14/2022 Medical Rec #: [id number]  Accession #:    [id number] Date of Birth: Nov 11, 1955         Patient Gender: M Patient Age:   3 years Exam Location:  [facility] Procedure:      VAS US CAROTID Referring Phys:   --------------------------------------------------------------------------------  Indications:       Correlate CTA Neck concern for >75% LT stent stenosis. Comparison Study:  10-12-2022 CTA Neck approximately 75% stenosis at the level                    of the bifurcation due to significant eccentric plaque.                     11-14-2021 Prior Carotid Duplex showed no evidence of left                     stent restenosis. Performing Technologist: Jean Rosenthal RDMS, RVT  Examination Guidelines: A complete evaluation includes B-mode imaging, spectral Doppler, color Doppler, and power Doppler as needed of all accessible portions of each vessel. Bilateral testing is considered an integral part of a complete examination. Limited examinations for reoccurring indications may be performed as noted.  Right Carotid Findings: +----------+--------+--------+--------+------------------+--------+           PSV cm/sEDV cm/sStenosisPlaque DescriptionComments +----------+--------+--------+--------+------------------+--------+ CCA Prox  99      19      <50%    heterogenous               +----------+--------+--------+--------+------------------+--------+ CCA Distal98      21                                         +----------+--------+--------+--------+------------------+--------+ ICA Prox  70      21      1-39%   heterogenous               +----------+--------+--------+--------+------------------+--------+ ICA Distal90      28                                         +----------+--------+--------+--------+------------------+--------+ ECA       162     14                                         +----------+--------+--------+--------+------------------+--------+ +----------+--------+-------+----------------+-------------------+           PSV cm/sEDV cmsDescribe        Arm Pressure (mmHG) +----------+--------+-------+----------------+-------------------+ [id number]            Multiphasic, WNL                    +----------+--------+-------+----------------+-------------------+ +---------+--------+--+--------+--+---------+ VertebralPSV cm/s63EDV cm/s12Antegrade +---------+--------+--+--------+--+---------+  Left Carotid Findings: +----------+--------+-------+--------+---------------------------+-------------+           PSV cm/sEDV    StenosisPlaque Description          Comments                        cm/s                                                    +----------+--------+-------+--------+---------------------------+-------------+  CCA Prox  87      26     <50%    hyperechoic and                                                           heterogenous                             +----------+--------+-------+--------+---------------------------+-------------+ CCA Distal95      20                                                      +----------+--------+-------+--------+---------------------------+-------------+ ICA Prox                                                    CCA/ICA stent +----------+--------+-------+--------+---------------------------+-------------+ ICA Distal89      35                                                      +----------+--------+-------+--------+---------------------------+-------------+ ECA       275     25     >50%                                             +----------+--------+-------+--------+---------------------------+-------------+ +----------+--------+--------+----------------+-------------------+           PSV cm/sEDV cm/sDescribe        Arm Pressure (mmHG) +----------+--------+--------+----------------+-------------------+ [id number]             Multiphasic, WNL                    +----------+--------+--------+----------------+-------------------+ +---------+--------+--+--------+--+---------+ VertebralPSV cm/s51EDV cm/s17Antegrade +---------+--------+--+--------+--+---------+  Left Stent(s): +---------------+---+--+---------------++--------------------------------------+ Prox to Stent  85 22                                                      +---------------+---+--+---------------++--------------------------------------+ Proximal Stent 86 20                                                       +---------------+---+--+---------------++--------------------------------------+ Mid Stent      [id number]% stenosisFocal, calcific plaque with posterior                                     shadowing, lower end of range          +---------------+---+--+---------------++--------------------------------------+  Distal Stent   14430                                                      +---------------+---+--+---------------++--------------------------------------+ Distal to [id number]                                                      +---------------+---+--+---------------++--------------------------------------+    Summary: Right Carotid: Velocities in the right ICA are consistent with a 1-39% stenosis.                Non-hemodynamically significant plaque <50% noted in the CCA. Left Carotid: Non-hemodynamically significant plaque <50% noted in the CCA. The               ECA appears >50% stenosed. Patent left ICA stent. Focal, calcific               plaque at the mid segments with 50-75% stenosis, lower end of               range. Vertebrals:  Bilateral vertebral arteries demonstrate antegrade flow. Subclavians: Normal flow hemodynamics were seen in bilateral subclavian              arteries. *See table(s) above for measurements and observations.     Preliminary    MR BRAIN WO CONTRAST  Result Date: 10/13/2022 CLINICAL DATA:  Stroke follow-up EXAM: MRI HEAD WITHOUT CONTRAST TECHNIQUE: Multiplanar, multiecho pulse sequences of the brain and surrounding structures were obtained without intravenous contrast. COMPARISON:  None Available. FINDINGS: Brain: No acute infarct, mass effect or extra-axial collection. Hemosiderin deposition at the superior left parietal lobe. There is multifocal hyperintense T2-weighted signal within the white matter. Parenchymal volume and CSF spaces are normal. The midline structures are normal. Vascular: Major flow voids are preserved. Skull and upper cervical  spine: Normal calvarium and skull base. Visualized upper cervical spine and soft tissues are normal. Sinuses/Orbits:No paranasal sinus fluid levels or advanced mucosal thickening. No mastoid or middle ear effusion. Normal orbits. IMPRESSION: 1. No acute intracranial abnormality. 2. Chronic small vessel ischemia. Electronically Signed   By: Deatra Robinson M.D.   On: 10/13/2022 20:09    Vitals:   10/14/22 1100 10/14/22 1200 10/14/22 1300 10/14/22 1400  BP: (!) 152/73 (!) 153/73 (!) 169/78 (!) 170/79  Pulse: 84 81 80 82  Resp: 17 19 (!) 21 19  Temp:  98.6 F (37 C)    TempSrc:  Oral    SpO2: 94% 96% 98% 97%  Weight:      Height:         PHYSICAL EXAM General:  alert and oriented to person, place, time. CV: normal rate and rhythm.  Respiratory: normal effort.     NEURO:  Mental Status: alert and oriented to person, place, time. Attention intact. Recent memory is impaired including episode that brought him in. Immediate memory intact.  Speech/Language: Speech with aphasia or dysarthria. Hoarse voice from intubation.   Cranial Nerves:  II: PERRL. No evidence of previous right hemianopsia on exam today.  III, IV, VI: EOMI. Eyelids elevate symmetrically.  V: Facial sensation intact.  VII: Face is symmetrical at rest.  VIII: hearing intact bilaterally and  not experiencing vertigo. IX, X: palate elevates equally and uvula midline.  XI: Shoulder elevation intact. XII: Tongue midline. Motor: 5/5 strength in all four extremities.   Tone: normal tone and bulk.  Sensation- Intact to light touch bilaterally. Extinction absent to light touch to DSS.   Coordination: FTN intact bilaterally, HKS: no ataxia in BLE.No drift.  Gait- deferred   ASSESSMENT/PLAN  Strokelike episode s/p IV TNK -transient focal left hemispheric dysfunction symptoms which appear to have resolved and MRI is negative for stroke.  Possibilities include focal seizure with some postictal residual deficits which appear to  have resolved and now  code Stroke CT head: Cortical and subcortical white matter hypoattenuation in the high left parietal lobe without significant volume loss.Small vessel disease. ASPECTS 9.   CTA head & neck: Left CC IC arteries with patent stent and 75% stenosis at level of bifurcation with significant eccentric plaque. Left IC distal to stent is patent. 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 Calcified plaque in the intracranial ICAs resulting in severe stenosis of the right cavernous segment and left supraclinoid segment. MRI  no acute intracranial abnormality  Carotid Doppler (bilateral)  R ICA 1-39%. R CCA <50%. L CCA <50%. Patent L ICA stent but plaque at mid segments w/ 50-75% stenosis.  2D Echo 50-60%. No evident of PFO. No comment on source of embolism.  LDL 8 HgbA1c 8.9 VTE prophylaxis - lovenox 40 aspirin 81 mg daily and clopidogrel 75 mg daily prior to admission, now 24hr s/p TNK restarting aspirin 81 mg once daily and clopidogrel 75 mg once daily for L ICA stent Therapy recommendations:  pending Disposition:  pending  Concern for seizure Anterograde amnesia, personality changes, acting "possessed" per wife, focal weakness of LLE and facial muscles Clinical presentation more consistent with focal seizure with Todd's paralysis (confused for stroke) EEG negative, current on long term EEG monitor  Hx of Stroke/TIA 01/2014 TIA: R facial numbness 10/2019 vascular surgery note reports intermittent L hand numbness prior to right CEA.  No other documented ED/hosp notes for stroke/TIA  Hypertension Home meds:  lisinopril 20 mg Hypertensive since extubation, restarted lisinopril 20 mg BP goal normotensive  Hyperlipidemia Home meds:  rosuvastatin 40 mg once daily and fenofibrate 145 mg once daily, resumed statin in hospital LDL 0 --> 8, goal < 70 Continue statin at discharge  Diabetes type II Uncontrolled Home meds:  metformin 1000 mg twice daily, semaglutide  0.25 once weekly, novolog 14 units three times daily with meals, novolog restarted at 7 units three times daily due to NPO currently.  HgbA1c 8.6, goal < 7.0 Recommend close follow-up with PCP for better DM control  Past Tobacco Abuse Patient smokes 0.5 packs per day for 30 years Quit in 2020 Nicotine replacement therapy provided  Dysphagia Patient has post-stroke dysphagia, SLP consulted    Diet   Diet regular Room service appropriate? Yes with Assist; Fluid consistency: Thin   Advance diet as tolerated  Other Stroke Risk Factors ETOH use, alcohol level <10, advised to drink no more than 2 drinks a day Coronary artery disease   Other Active Problems Acute Respiratory Insufficiency, now extubated Sedation-related Hypotension, extubated now hypertensive Leukocytosis, WBC 6.7 --> 17.0, afebrile, negative CXR yesterday, will continue trending Hypokalemia, resolved at 3.6 today, daily BMPs CCM physician following above issues.   Hospital day # 2  Meryl Dare, MD PGY-1 Psychiatry Resident 10/14/2022, 3:44 PM     STROKE MD NOTE :  Patient presented with aphasia and right  hemiparesis likely strokelike episodes since brain imaging is negative for stroke.  Received IV TNK EEG shows focal left hemispheric slowing raising concern for possible seizure with postictal deficits.  Plan to place patient on long-term EEG monitoring overnight look for epileptiform activity.  No family available at the bedside for discussion.   Consider transfer out of ICU when bed available.  Greater than 50% time during this 50-minute visit were spent on counseling and coordination of care and discussion with patient and care team and answering questions.   Delia Heady, MD Medical Director [facility] Pager: [phone] 10/14/2022 3:44 PM   To contact Stroke Continuity provider, please refer to [email or web address]. After hours, contact General Neurology

## 2022-10-14 NOTE — Evaluation (Signed)
Physical Therapy Evaluation Patient Details Name: Jimmy Duran MRN: [id number] DOB: 06-17-1955 Today's Date: 10/14/2022  History of Present Illness  Pt is a 67 y.o. M who presents 10/12/2022 with abnormal speech and right sided weakness s/p TNK. Intubated 8/10-8/12. MRI negative for acute abnormality. Significant PMH: DM2, HTN, TIAs, PAD, AAA,  Clinical Impression  PTA, pt lives with his spouse and is independent. Pt A&Ox2, reports decreased recall of events leading up to hospitalization. PT evaluation overall limited as pt connected to continuous EEG. Pt able to perform multidirectional stepping and transfer to chair with no assistive device at a contact guard assist level. Demonstrates mild deficits in static standing balance. On MMT, pt with equal strength bilaterally and denies numbness/tingling. Will continue to progress as tolerated. Currently recommending increased supervision initially due to cognitive deficits and OPPT to address balance.      If plan is discharge home, recommend the following: Assistance with cooking/housework;Direct supervision/assist for medications management;Direct supervision/assist for financial management;Assist for transportation   Can travel by private vehicle        Equipment Recommendations None recommended by PT  Recommendations for Other Services       Functional Status Assessment Patient has had a recent decline in their functional status and demonstrates the ability to make significant improvements in function in a reasonable and predictable amount of time.     Precautions / Restrictions Precautions Precautions: Fall Precaution Comments: EEG Restrictions Weight Bearing Restrictions: No      Mobility  Bed Mobility Overal bed mobility: Needs Assistance Bed Mobility: Supine to Sit     Supine to sit: Supervision     General bed mobility comments: Supervision to safety and line management    Transfers Overall transfer level: Needs  assistance Equipment used: None Transfers: Sit to/from Stand, Bed to chair/wheelchair/BSC Sit to Stand: Contact guard assist   Step pivot transfers: Contact guard assist       General transfer comment: Good power up to standing, able to take multi directional steps (laterally to R/L, forwards, backwards) with CGA. No gross unsteadiness noted.    Ambulation/Gait                  Stairs            Wheelchair Mobility     Tilt Bed    Modified Rankin (Stroke Patients Only) Modified Rankin (Stroke Patients Only) Pre-Morbid Rankin Score: No symptoms Modified Rankin: Moderately severe disability     Balance Overall balance assessment: Needs assistance Sitting-balance support: Feet supported Sitting balance-Leahy Scale: Normal Sitting balance - Comments: Donning socks independently   Standing balance support: No upper extremity supported, During functional activity Standing balance-Leahy Scale: Good           Rhomberg - Eyes Opened: 3                   Pertinent Vitals/Pain Pain Assessment Pain Assessment: No/denies pain    Home Living Family/patient expects to be discharged to:: Private residence Living Arrangements: Spouse/significant other;Children Available Help at Discharge: Family Type of Home: House Home Access: Level entry       Home Layout: One level Home Equipment: None Additional Comments: 2 dogs Dixie and Rambo    Prior Function Prior Level of Function : Independent/Modified Independent;Driving             Mobility Comments: does not work       Extremity/Trunk Assessment   Upper Extremity Assessment Upper Extremity Assessment: Overall [facility]  for tasks assessed    Lower Extremity Assessment Lower Extremity Assessment: Overall WFL for tasks assessed    Cervical / Trunk Assessment Cervical / Trunk Assessment: Normal  Communication   Communication Communication: No apparent difficulties  Cognition Arousal:  Alert Behavior During Therapy: Flat affect Overall Cognitive Status: Impaired/Different from baseline Area of Impairment: Orientation, Memory                 Orientation Level: Disoriented to, Place, Situation   Memory: Decreased short-term memory         General Comments: Pt A&O to self and time, not place or orientation. Able to correctly state city and location when given options. Pt with no recall of events leading up to hospitalization.        General Comments General comments (skin integrity, edema, etc.): BP 180/82 pre mobility, 165/72 (97) post mobility, 98% SpO2 on RA, HR 91    Exercises     Assessment/Plan    PT Assessment Patient needs continued PT services  PT Problem List Decreased balance;Decreased cognition       PT Treatment Interventions Gait training;Therapeutic activities;Stair training;Functional mobility training;Therapeutic exercise;Balance training;Patient/family education    PT Goals (Current goals can be found in the Care Plan section)  Acute Rehab PT Goals Patient Stated Goal: did not state PT Goal Formulation: With patient Time For Goal Achievement: 10/28/22 Potential to Achieve Goals: Good    Frequency Min 1X/week     Co-evaluation               AM-PAC PT "6 Clicks" Mobility  Outcome Measure Help needed turning from your back to your side while in a flat bed without using bedrails?: None Help needed moving from lying on your back to sitting on the side of a flat bed without using bedrails?: A Little Help needed moving to and from a bed to a chair (including a wheelchair)?: A Little Help needed standing up from a chair using your arms (e.g., wheelchair or bedside chair)?: A Little Help needed to walk in hospital room?: A Little Help needed climbing 3-5 steps with a railing? : A Little 6 Click Score: 19    End of Session   Activity Tolerance: Patient tolerated treatment well Patient left: in chair;with chair alarm  set;with call bell/phone within reach Nurse Communication: Mobility status PT Visit Diagnosis: Unsteadiness on feet (R26.81)    Time: 1324-4010 PT Time Calculation (min) (ACUTE ONLY): 22 min   Charges:   PT Evaluation $PT Eval Moderate Complexity: 1 Mod   PT General Charges $$ ACUTE PT VISIT: 1 Visit         Lillia Pauls, PT, DPT Acute Rehabilitation Services Office [phone]   Norval Morton 10/14/2022, 4:43 PM

## 2022-10-14 NOTE — Progress Notes (Signed)
LTM EEG hooked up and running - no initial skin breakdown - push button tested - [facility] monitoring.  

## 2022-10-14 NOTE — Progress Notes (Addendum)
LTM moved. Patient moved to 3w from 4n all leads attached. [facility] to monitor, Event button test not confirmed by [facility]- service manager issue at present. Will try back later for connection.

## 2022-10-14 NOTE — Progress Notes (Signed)
Per order pt and all belongings transferred to 3W-35 via bed without incident. Report given to [place], Charity fundraiser. All questions answered. Pt wife at bedside.

## 2022-10-14 NOTE — Progress Notes (Signed)
Initial Nutrition Assessment  DOCUMENTATION CODES:   Severe malnutrition in context of chronic illness  INTERVENTION:   Liberalize diet to Regular, assist with meal ordering  Ensure Enlive po BID, each supplement provides 350 kcal and 20 grams of protein.  Magic cup TID with meals, each supplement provides 290 kcal and 9 grams of protein   NUTRITION DIAGNOSIS:   Severe Malnutrition related to chronic illness as evidenced by severe muscle depletion, severe fat depletion.  GOAL:   Patient will meet greater than or equal to 90% of their needs  MONITOR:   PO intake, Supplement acceptance  REASON FOR ASSESSMENT:   New TF    ASSESSMENT:   Pt with PMH of multiple TIAs, bilateral carotid revascularizations due to carotid artery stenosis bilaterally, AAA, tobacco abuse, COPD, PVD and DM admitted with new onset aphasia, R hemiparesis due to acute ischemic infarct s/p TNK.  Pt discussed during ICU rounds and with RN and MD.   Pt seen on vent prior to extubation. Pt nods yes to having lost weight recently.   Medications reviewed and include: docusate, pepcid, SSI, 12 units semglee daily, solumedrol, miralax  Labs reviewed:  A1C: 8.6 CBG's: 165-228   NUTRITION - FOCUSED PHYSICAL EXAM:  Flowsheet Row Most Recent Value  Orbital Region Severe depletion  Upper Arm Region Severe depletion  Thoracic and Lumbar Region Severe depletion  Buccal Region Unable to assess  Temple Region Severe depletion  Clavicle Bone Region Severe depletion  Clavicle and Acromion Bone Region Severe depletion  Scapular Bone Region Unable to assess  Dorsal Hand Unable to assess  Patellar Region Severe depletion  Anterior Thigh Region Severe depletion  Posterior Calf Region Severe depletion  Edema (RD Assessment) None  Hair Reviewed  Eyes Reviewed  Mouth Unable to assess  Skin Reviewed  Nails Unable to assess       Diet Order:   Diet Order             Diet regular Room service  appropriate? Yes with Assist; Fluid consistency: Thin  Diet effective now                   EDUCATION NEEDS:   Not appropriate for education at this time  Skin:  Skin Assessment: Reviewed RN Assessment  Last BM:  unknown  Height:   Ht Readings from Last 1 Encounters:  10/12/22 5' 7.01" (1.702 m)    Weight:   Wt Readings from Last 1 Encounters:  10/14/22 57.4 kg    BMI:  Body mass index is 19.81 kg/m.  Estimated Nutritional Needs:   Kcal:  1700-1900  Protein:  85-100 grams  Fluid:  >1.7 L/day  Cammy Copa., RD, LDN, CNSC See AMiON for contact information

## 2022-10-14 NOTE — Progress Notes (Signed)
Tube feed found in patients mouth, its unclear if he vomited due to insignificant amount identified Tube feeds held. Informed elink MD

## 2022-10-14 NOTE — TOC CM/SW Note (Signed)
Transition of Care [facility]) - Inpatient Brief Assessment   Patient Details  Name: Jimmy Duran MRN: [id number] Date of Birth: 12/23/1955  [facility]) CM/SW Contact:    Glennon Mac, RN Phone Number: 10/14/2022, 5:06 PM   Clinical Narrative:  Pt is a 67 y.o. M who presents 10/12/2022 with abnormal speech and right sided weakness s/p TNK. Intubated 8/10-8/12.  Prior to admission, patient independent and living at home with spouse.  PT currently recommending outpatient therapy; TOC will continue to follow for outpatient follow-up/home needs.   Transition of Care Asessment: Insurance and Status: Insurance coverage has been reviewed Patient has primary care physician: Yes Delmar Landau) Home environment has been reviewed: Lives with spouse Prior level of function:: Independent Prior/Current Home Services: No current home services Social Determinants of Health Reivew: SDOH reviewed no interventions necessary Readmission risk has been reviewed: Yes Transition of care needs: transition of care needs identified, TOC will continue to follow  Quintella Baton, RN, BSN  Trauma/Neuro ICU Case Manager [phone]

## 2022-10-15 ENCOUNTER — Ambulatory Visit: Payer: Medicare HMO | Admitting: Cardiology

## 2022-10-15 ENCOUNTER — Encounter (HOSPITAL_COMMUNITY): Payer: Self-pay | Admitting: Neurology

## 2022-10-15 DIAGNOSIS — R569 Unspecified convulsions: Principal | ICD-10-CM

## 2022-10-15 DIAGNOSIS — I639 Cerebral infarction, unspecified: Secondary | ICD-10-CM | POA: Diagnosis not present

## 2022-10-15 HISTORY — DX: Unspecified convulsions: R56.9

## 2022-10-15 LAB — GLUCOSE, CAPILLARY
Glucose-Capillary: 207 mg/dL — ABNORMAL HIGH (ref 70–99)
Glucose-Capillary: 166 mg/dL — ABNORMAL HIGH (ref 70–99)
Glucose-Capillary: 238 mg/dL — ABNORMAL HIGH (ref 70–99)

## 2022-10-15 MED ORDER — ORAL CARE MOUTH RINSE
15.0000 mL | Freq: Three times a day (TID) | OROMUCOSAL | Status: DC
  Administered 2022-10-15: 15 mL via OROMUCOSAL

## 2022-10-15 MED ORDER — DIVALPROEX SODIUM ER 500 MG PO TB24: 500.0000 mg | ORAL_TABLET | Freq: Every day | ORAL | 1 refills | Status: DC

## 2022-10-15 MED ORDER — DIVALPROEX SODIUM ER 500 MG PO TB24
500.0000 mg | ORAL_TABLET | Freq: Every day | ORAL | Status: DC
  Administered 2022-10-15: 500 mg via ORAL
  Filled 2022-10-15: qty 1

## 2022-10-15 MED ORDER — CLOPIDOGREL BISULFATE 75 MG PO TABS: 75.0000 mg | ORAL_TABLET | Freq: Every day | ORAL | 0 refills | Status: DC

## 2022-10-15 NOTE — Procedures (Signed)
Patient Name: Jimmy Duran  MRN: [id number]  Epilepsy Attending: Charlsie Quest  Referring Physician/Provider: Mathews Argyle, NP  Duration: 10/15/2022 8242 to 10/15/2022 1149   Patient history:  67yo M with sudden right sided weakness getting eeg to evaluate for seizure.    Level of alertness:  awake, asleep   AEDs during EEG study: None   Technical aspects: This EEG study was done with scalp electrodes positioned according to the 10-20 International system of electrode placement. Electrical activity was reviewed with band pass filter of 1-70Hz , sensitivity of 7 uV/mm, display speed of 20mm/sec with a 60Hz  notched filter applied as appropriate. EEG data were recorded continuously and digitally stored.  Video monitoring was available and reviewed as appropriate.   Description: The posterior dominant rhythm consists of 7.5 Hz activity of moderate voltage (25-35 uV) seen predominantly in posterior head regions, symmetric and reactive to eye opening and eye closing. Sleep was characterized by vertex waves, maximal frontocentral region. EEG showed continuous 3 to 6 Hz theta-delta slowing left hemisphere as well as intermittent generalized 3-6hz  theta-delta slowing. Hyperventilation and photic stimulation were not performed.      ABNORMALITY - Continuous slow, left hemisphere - Intermittent slow, generalized    IMPRESSION: This study is suggestive of cortical dysfunction arising from left hemisphere likely secondary to underlying structural abnormality, post-ictal state. Additionally there is mild diffuse encephalopathy.. No seizures or epileptiform discharges were seen throughout the recording.    Annabelle Harman

## 2022-10-15 NOTE — Procedures (Signed)
Patient Name: Jimmy Duran  MRN: [id number]  Epilepsy Attending: Charlsie Quest  Referring Physician/Provider: Mathews Argyle, NP  Duration: 10/14/2022 7829 to 10/15/2022 5621  Patient history:  67yo M with sudden right sided weakness getting eeg to evaluate for seizure.    Level of alertness:  awake, asleep   AEDs during EEG study: None   Technical aspects: This EEG study was done with scalp electrodes positioned according to the 10-20 International system of electrode placement. Electrical activity was reviewed with band pass filter of 1-70Hz , sensitivity of 7 uV/mm, display speed of 70mm/sec with a 60Hz  notched filter applied as appropriate. EEG data were recorded continuously and digitally stored.  Video monitoring was available and reviewed as appropriate.   Description: The posterior dominant rhythm consists of 7.5 Hz activity of moderate voltage (25-35 uV) seen predominantly in posterior head regions, symmetric and reactive to eye opening and eye closing. Sleep was characterized by vertex waves, maximal frontocentral region. EEG showed continuous 3 to 6 Hz theta-delta slowing left hemisphere as well as intermittent generalized 3-6hz  theta-delta slowing. Hyperventilation and photic stimulation were not performed.      ABNORMALITY - Continuous slow, left hemisphere - Intermittent slow, generalized    IMPRESSION: This study is suggestive of cortical dysfunction arising from left hemisphere likely secondary to underlying structural abnormality, post-ictal state. Additionally there is mild diffuse encephalopathy.. No seizures or epileptiform discharges were seen throughout the recording.    Annabelle Harman

## 2022-10-15 NOTE — Care Management Important Message (Signed)
Important Message  Patient Details  Name: Jimmy Duran MRN: [id number] Date of Birth: [birth year]   Medicare Important Message Given:  Yes     Dorena Bodo 10/15/2022, 2:18 PM

## 2022-10-15 NOTE — Progress Notes (Signed)
LTM EEG discontinued - no skin breakdown at unhook.   

## 2022-10-15 NOTE — Progress Notes (Signed)
LTM maint complete - no skin breakdown under:  Fp1 A1 F3 Serviced A1 P7 A1 P3

## 2022-10-15 NOTE — Evaluation (Signed)
Occupational Therapy Evaluation Patient Details Name: Jimmy Duran MRN: [id number] DOB: 02/14/1956 Today's Date: 10/15/2022   History of Present Illness Pt is a 67 y.o. M who presents 10/12/2022 with abnormal speech and right sided weakness s/p TNK. Intubated 8/10-8/12. MRI negative for acute abnormality. Significant PMH: DM2, HTN, TIAs, PAD, AAA,   Clinical Impression   PTA, pt lives with spouse, typically completely independent in ADLs, IADLs, driving and mobility. Pt presents now with minor deficits in dynamic standing balance and cognition. Pt able to mobilize around unit with primarily CGA w/ self correction of LOB. Pt balance did improve with handheld assist; may benefit from cane use. Pt requires no more than Supervision for LB ADLs. Pt endorses intermittent "fogginess" with his thinking. Encouraged pt to have assist with tasks such as med mgmt on initial return home. Anticipate no OT needs at DC but will follow while admitted.        If plan is discharge home, recommend the following: Assistance with cooking/housework;Direct supervision/assist for medications management;Direct supervision/assist for financial management;Assist for transportation    Functional Status Assessment  Patient has had a recent decline in their functional status and demonstrates the ability to make significant improvements in function in a reasonable and predictable amount of time.  Equipment Recommendations  None recommended by OT    Recommendations for Other Services       Precautions / Restrictions Precautions Precautions: Fall Restrictions Weight Bearing Restrictions: No      Mobility Bed Mobility Overal bed mobility: Modified Independent Bed Mobility: Supine to Sit, Sit to Supine                Transfers Overall transfer level: Needs assistance Equipment used: None Transfers: Sit to/from Stand Sit to Stand: Contact guard assist                  Balance Overall balance  assessment: Needs assistance Sitting-balance support: Feet supported Sitting balance-Leahy Scale: Normal     Standing balance support: No upper extremity supported, During functional activity Standing balance-Leahy Scale: Good                             ADL either performed or assessed with clinical judgement   ADL Overall ADL's : Needs assistance/impaired Eating/Feeding: Independent   Grooming: Supervision/safety;Standing   Upper Body Bathing: Independent   Lower Body Bathing: Supervison/ safety   Upper Body Dressing : Independent   Lower Body Dressing: Supervision/safety   Toilet Transfer: Contact guard Designer, fashion/clothing- Clothing Manipulation and Hygiene: Supervision/safety;Sit to/from stand;Sitting/lateral lean Toileting - Clothing Manipulation Details (indicate cue type and reason): cued for clothing mgmt of gown around back and pt able to manage rest of task without assist     Functional mobility during ADLs: Contact guard assist General ADL Comments: minor unsteadiness with mobility around unit, improved with handheld assist. LOB when distracted or multitasking but able to self correct     Vision Ability to See in Adequate Light: 0 Adequate Patient Visual Report: No change from baseline Vision Assessment?: No apparent visual deficits     Perception         Praxis         Pertinent Vitals/Pain Pain Assessment Pain Assessment: No/denies pain     Extremity/Trunk Assessment Upper Extremity Assessment Upper Extremity Assessment: Overall WFL for tasks assessed   Lower Extremity Assessment Lower Extremity Assessment: Defer to PT evaluation   Cervical /  Trunk Assessment Cervical / Trunk Assessment: Normal   Communication Communication Communication: No apparent difficulties   Cognition Arousal: Alert Behavior During Therapy: WFL for tasks assessed/performed, Flat affect Overall Cognitive Status:  Impaired/Different from baseline Area of Impairment: Memory, Problem solving                     Memory: Decreased short-term memory       Problem Solving: Slow processing General Comments: decreased recall of events leading to admission. pleasant, follows commands consistently with increased time. some slower processing. pt endorses feeling "foggy" sometimes     General Comments       Exercises     Shoulder Instructions      Home Living Family/patient expects to be discharged to:: Private residence Living Arrangements: Spouse/significant other;Children Available Help at Discharge: Family;Available 24 hours/day Type of Home: House Home Access: Level entry     Home Layout: One level     Bathroom Shower/Tub: Chief Strategy Officer: Standard     Home Equipment: None          Prior Functioning/Environment Prior Level of Function : Independent/Modified Independent;Driving                        OT Problem List: Decreased activity tolerance;Impaired balance (sitting and/or standing);Decreased cognition      OT Treatment/Interventions: Self-care/ADL training;Therapeutic exercise;Energy conservation;DME and/or AE instruction;Therapeutic activities;Balance training    OT Goals(Current goals can be found in the care plan section) Acute Rehab OT Goals Patient Stated Goal: home today OT Goal Formulation: With patient Time For Goal Achievement: 10/29/22 Potential to Achieve Goals: Good  OT Frequency: Min 1X/week    Co-evaluation              AM-PAC OT "6 Clicks" Daily Activity     Outcome Measure Help from another person eating meals?: None Help from another person taking care of personal grooming?: A Little Help from another person toileting, which includes using toliet, bedpan, or urinal?: A Little Help from another person bathing (including washing, rinsing, drying)?: A Little Help from another person to put on and taking off  regular upper body clothing?: None Help from another person to put on and taking off regular lower body clothing?: A Little 6 Click Score: 20   End of Session Nurse Communication: Mobility status  Activity Tolerance: Patient tolerated treatment well Patient left: in bed;with call bell/phone within reach;with bed alarm set  OT Visit Diagnosis: Unsteadiness on feet (R26.81);Other abnormalities of gait and mobility (R26.89);Muscle weakness (generalized) (M62.81)                Time: 1610-9604 OT Time Calculation (min): 13 min Charges:  OT General Charges $OT Visit: 1 Visit OT Evaluation $OT Eval Low Complexity: 1 Low  Bradd Canary, OTR/L Acute Rehab Services Office: [phone]   Lorre Munroe 10/15/2022, 2:16 PM

## 2022-10-15 NOTE — Inpatient Diabetes Management (Signed)
Inpatient Diabetes Program Recommendations  AACE/ADA: New Consensus Statement on Inpatient Glycemic Control (2015)  Target Ranges:  Prepandial:   less than 140 mg/dL      Peak postprandial:   less than 180 mg/dL (1-2 hours)      Critically ill patients:  140 - 180 mg/dL   Lab Results  Component Value Date   GLUCAP 166 (H) 10/15/2022   HGBA1C 8.6 (H) 10/13/2022    Review of Glycemic Control  Latest Reference Range & Units 10/14/22 07:20 10/14/22 11:20 10/14/22 15:20 10/14/22 20:26 10/14/22 23:52 10/15/22 04:15 10/15/22 08:08  Glucose-Capillary 70 - 99 mg/dL 409 (H) 811 (H) 914 (H) 201 (H) 146 (H) 207 (H) 166 (H)   Diabetes history: DM 2 Outpatient Diabetes medications: Lantus 24 units Daily, Metformin 1000 mg Daily, Novolog 14 units tid, Ozempic 0.25 mg weekly Current orders for Inpatient glycemic control:  Semglee 12 units Daily Novolog 0-9 units Q4 hours  Solumedrol 40 mg Q12 Ensure Enlive bid between meals (40 grams of carbohydrates)  Inpatient Diabetes Program Recommendations:    -  Consider adding Novolog 3 units tid meal coverage if eating >50% of meals  Thanks,  Christena Deem RN, MSN, BC-ADM Inpatient Diabetes Coordinator Team Pager [phone] (8a-5p)

## 2022-10-15 NOTE — Discharge Instructions (Signed)
Take depakote once daily. Follow up with neurology for any concerns.  Follow up with your PCP Follow up with [facility] in 8 weeks Continue to take aspirin 81 for life Continue to take Plavix 75 mg one pill for three weeks.  Have family familiarize themselves with BEFAST for stroke symptoms, although we believe you had a seizure you are at elevated risk to have a stroke in the future.  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

## 2022-10-15 NOTE — Plan of Care (Signed)
  Problem: Education: Goal: Knowledge of General Education information will improve Description: Including pain rating scale, medication(s)/side effects and non-pharmacologic comfort measures Outcome: Adequate for Discharge   Problem: Health Behavior/Discharge Planning: Goal: Ability to manage health-related needs will improve Outcome: Adequate for Discharge   Problem: Clinical Measurements: Goal: Ability to maintain clinical measurements within normal limits will improve Outcome: Adequate for Discharge Goal: Will remain free from infection Outcome: Adequate for Discharge Goal: Diagnostic test results will improve Outcome: Adequate for Discharge Goal: Respiratory complications will improve Outcome: Adequate for Discharge Goal: Cardiovascular complication will be avoided Outcome: Adequate for Discharge   Problem: Activity: Goal: Risk for activity intolerance will decrease Outcome: Adequate for Discharge   Problem: Nutrition: Goal: Adequate nutrition will be maintained Outcome: Adequate for Discharge   Problem: Coping: Goal: Level of anxiety will decrease Outcome: Adequate for Discharge   Problem: Elimination: Goal: Will not experience complications related to bowel motility Outcome: Adequate for Discharge Goal: Will not experience complications related to urinary retention Outcome: Adequate for Discharge   Problem: Pain Managment: Goal: General experience of comfort will improve Outcome: Adequate for Discharge   Problem: Safety: Goal: Ability to remain free from injury will improve Outcome: Adequate for Discharge   Problem: Skin Integrity: Goal: Risk for impaired skin integrity will decrease Outcome: Adequate for Discharge   Problem: Activity: Goal: Ability to tolerate increased activity will improve Outcome: Adequate for Discharge   Problem: Respiratory: Goal: Ability to maintain a clear airway and adequate ventilation will improve Outcome: Adequate for  Discharge   Problem: Role Relationship: Goal: Method of communication will improve Outcome: Adequate for Discharge   Problem: Education: Goal: Knowledge of disease or condition will improve Outcome: Adequate for Discharge Goal: Knowledge of secondary prevention will improve (MUST DOCUMENT ALL) Outcome: Adequate for Discharge Goal: Knowledge of patient specific risk factors will improve Loraine Leriche N/A or DELETE if not current risk factor) Outcome: Adequate for Discharge   Problem: Ischemic Stroke/TIA Tissue Perfusion: Goal: Complications of ischemic stroke/TIA will be minimized Outcome: Adequate for Discharge   Problem: Coping: Goal: Will verbalize positive feelings about self Outcome: Adequate for Discharge Goal: Will identify appropriate support needs Outcome: Adequate for Discharge   Problem: Health Behavior/Discharge Planning: Goal: Ability to manage health-related needs will improve Outcome: Adequate for Discharge Goal: Goals will be collaboratively established with patient/family Outcome: Adequate for Discharge   Problem: Self-Care: Goal: Ability to participate in self-care as condition permits will improve Outcome: Adequate for Discharge Goal: Verbalization of feelings and concerns over difficulty with self-care will improve Outcome: Adequate for Discharge Goal: Ability to communicate needs accurately will improve Outcome: Adequate for Discharge   Problem: Nutrition: Goal: Risk of aspiration will decrease Outcome: Adequate for Discharge Goal: Dietary intake will improve Outcome: Adequate for Discharge

## 2022-10-15 NOTE — TOC Transition Note (Signed)
Transition of Care [facility]) - CM/SW Discharge Note   Patient Details  Name: Jimmy Duran MRN: [id number] Date of Birth: 1955/05/24  [facility] CM/SW Contact:  Kermit Balo, RN Phone Number: 10/15/2022, 1:34 PM   Clinical Narrative:    Pt is discharging home with outpatient physical therapy through [facility] in [place]. Information faxed to [facility] at [phone]. Information on the AVS.  No new DME needs. Pt has cane at home. Pt manages his own medications and denies any issues.  Pt states his spouse does most of the driving and will pick him up today.    Final next level of care: OP Rehab Barriers to Discharge: No Barriers Identified   Patient Goals and CMS Choice      Discharge Placement                         Discharge Plan and Services Additional resources added to the After Visit Summary for                                       Social Determinants of Health (SDOH) Interventions SDOH Screenings   Food Insecurity: No Food Insecurity (06/18/2022)  Housing: Low Risk  (06/18/2022)  Transportation Needs: No Transportation Needs (06/18/2022)  Utilities: Not At Risk (03/29/2022)  Alcohol Screen: Low Risk  (06/18/2022)  Depression (PHQ2-9): Low Risk  (09/17/2022)  Financial Resource Strain: Low Risk  (06/18/2022)  Physical Activity: Insufficiently Active (06/18/2022)  Social Connections: Unknown (06/18/2022)  Recent Concern: Social Connections - Socially Isolated (03/29/2022)  Stress: No Stress Concern Present (06/18/2022)  Recent Concern: Stress - Stress Concern Present (03/29/2022)  Tobacco Use: High Risk (09/17/2022)     Readmission Risk Interventions     No data to display

## 2022-10-15 NOTE — Discharge Summary (Addendum)
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   albuterol (2.5 MG/3ML) 0.083% nebulizer solution Commonly known as: PROVENTIL Take 3 mLs (2.5 mg total) by nebulization every 6 (six) hours as needed for wheezing or shortness of breath.   aspirin EC 81 MG tablet Take 1 tablet (81 mg total) by mouth daily at 6 (six) AM. Swallow whole.   clopidogrel 75 MG tablet Commonly known as: PLAVIX Take 1 tablet (75 mg total) by mouth daily.   dicyclomine 20 MG tablet Commonly known as: BENTYL TAKE 1 TABLET BY MOUTH 4 TIMES DAILY AS NEEDED FOR SPASMS   diphenhydrAMINE 25 MG tablet Commonly known as: BENADRYL Take 25 mg by mouth daily as needed for allergies.   divalproex 500 MG 24 hr tablet Commonly known as: DEPAKOTE ER Take 1 tablet (500 mg total) by mouth daily.   fenofibrate 145 MG tablet Commonly known as: TRICOR Take 1 tablet (145 mg total) by mouth daily.   FreeStyle Libre 3 Sensor Misc 1 each by Does not apply route every 14 (fourteen) days. Place 1 sensor on the skin every 14 days. Use to check glucose continuously   Gvoke HypoPen 2-Pack 1 MG/0.2ML Soaj Generic drug: Glucagon Inject 1 mg into the skin as needed (hypoglycemia).   insulin glargine 100 UNIT/ML injection Commonly known  as: Lantus Inject 0.24 mLs (24 Units total) into the skin daily.   lansoprazole 30 MG capsule Commonly known as: PREVACID TAKE 1 CAPSULE BY MOUTH TWICE DAILY BEFORE A MEAL   lisinopril 20 MG tablet Commonly known as: ZESTRIL Take 1 tablet (20 mg total) by mouth daily.   metFORMIN 1000 MG tablet Commonly known as: GLUCOPHAGE TAKE 1 TABLET BY MOUTH TWICE DAILY WITH A MEAL   metoprolol tartrate 25 MG tablet Commonly known as: LOPRESSOR Take 1 tablet (25 mg total) by mouth 2 (two) times daily.   nitroGLYCERIN 0.4 MG SL tablet Commonly known as: NITROSTAT Place 1 tablet (0.4 mg total) under the tongue every 5 (five) minutes as needed for chest pain (Do not excieed more than 3 tablets in 15 minutes.).   NovoLOG 100 UNIT/ML injection Generic drug: insulin aspart Inject 14 Units into the skin 3 (three) times daily with meals.   Ozempic (0.25 or 0.5 MG/DOSE) 2 MG/3ML Sopn Generic drug: Semaglutide(0.25 or 0.5MG /DOS) Inject 0.25 mg into the skin once a week.   rosuvastatin 40 MG tablet Commonly known as: CRESTOR Take 1 tablet by mouth once daily        LABORATORY STUDIES CBC    Component Value Date/Time   WBC 9.9 10/15/2022 0445   RBC 4.09 (L) 10/15/2022 0445   HGB 12.3 (L) 10/15/2022 0445   HGB 14.2 08/02/2022 0938   HCT 36.7 (L) 10/15/2022 0445  HCT 43.7 08/02/2022 0938   PLT 233 10/15/2022 0445   PLT 326 08/02/2022 0938   MCV 89.7 10/15/2022 0445   MCV 90 08/02/2022 0938   MCH 30.1 10/15/2022 0445   MCHC 33.5 10/15/2022 0445   RDW 12.9 10/15/2022 0445   RDW 15.8 (H) 08/02/2022 0938   LYMPHSABS 0.5 (L) 10/15/2022 0445   LYMPHSABS 1.1 08/02/2022 0938   MONOABS 0.3 10/15/2022 0445   EOSABS 0.0 10/15/2022 0445   EOSABS 0.2 08/02/2022 0938   BASOSABS 0.0 10/15/2022 0445   BASOSABS 0.1 08/02/2022 0938   CMP    Component Value Date/Time   NA 138 10/15/2022 0445   NA 138 03/07/2022 1548   K 3.7 10/15/2022 0445   CL 101 10/15/2022 0445   CO2 26 10/15/2022 0445    GLUCOSE 209 (H) 10/15/2022 0445   BUN 13 10/15/2022 0445   BUN 5 (L) 03/07/2022 1548   CREATININE 0.85 10/15/2022 0445   CALCIUM 9.3 10/15/2022 0445   PROT 7.0 10/12/2022 1802   PROT 6.3 03/07/2022 1548   ALBUMIN 4.2 10/12/2022 1802   ALBUMIN 4.1 03/07/2022 1548   AST 17 10/12/2022 1802   ALT 11 10/12/2022 1802   ALKPHOS 44 10/12/2022 1802   BILITOT 0.5 10/12/2022 1802   BILITOT <0.2 03/07/2022 1548   GFRNONAA >60 10/15/2022 0445   GFRAA >60 10/09/2019 0729   COAGS Lab Results  Component Value Date   INR 1.0 10/12/2022   INR 1.0 10/04/2021   INR 1.0 10/01/2019   Lipid Panel    Component Value Date/Time   CHOL 85 10/14/2022 0523   CHOL 96 (L) 09/10/2021 0923   TRIG 212 (H) 10/14/2022 0523   HDL 35 (L) 10/14/2022 0523   HDL 34 (L) 09/10/2021 0923   CHOLHDL 2.4 10/14/2022 0523   VLDL 42 (H) 10/14/2022 0523   LDLCALC 8 10/14/2022 0523   LDLCALC 23 09/10/2021 0923   HgbA1C  Lab Results  Component Value Date   HGBA1C 8.6 (H) 10/13/2022   Urine Drug Screen positive only for benzo (received in ED) Alcohol Level    Component Value Date/Time   [facility] <10 10/12/2022 1802     SIGNIFICANT DIAGNOSTIC STUDIES VAS US CAROTID  Result Date: 10/15/2022 Carotid Arterial Duplex Study Patient Name:  Jimmy Duran  Date of Exam:   10/14/2022 Medical Rec #: [id number]         Accession #:    [id number] Date of Birth: [birth year]         Patient Gender: M Patient Age:   64 years Exam Location:  [facility] Procedure:      VAS US CAROTID Referring Phys:   --------------------------------------------------------------------------------  Indications:       Correlate CTA Neck concern for >75% LT stent stenosis. Comparison Study:  10-12-2022 CTA Neck approximately 75% stenosis at the level                    of the bifurcation due to significant eccentric plaque.                     11-14-2021 Prior Carotid Duplex showed no evidence of left                    stent  restenosis. Performing Technologist: Jean Rosenthal RDMS, RVT  Examination Guidelines: A complete evaluation includes B-mode imaging, spectral Doppler, color Doppler, and power Doppler as needed of all accessible portions of each vessel. Bilateral testing is  considered an integral part of a complete examination. Limited examinations for reoccurring indications may be performed as noted.  Right Carotid Findings: +----------+--------+--------+--------+------------------+--------+           PSV cm/sEDV cm/sStenosisPlaque DescriptionComments +----------+--------+--------+--------+------------------+--------+ CCA Prox  99      19      <50%    heterogenous               +----------+--------+--------+--------+------------------+--------+ CCA Distal98      21                                         +----------+--------+--------+--------+------------------+--------+ ICA Prox  70      21      1-39%   heterogenous               +----------+--------+--------+--------+------------------+--------+ ICA Distal90      28                                         +----------+--------+--------+--------+------------------+--------+ ECA       162     14                                         +----------+--------+--------+--------+------------------+--------+ +----------+--------+-------+----------------+-------------------+           PSV cm/sEDV cmsDescribe        Arm Pressure (mmHG) +----------+--------+-------+----------------+-------------------+ [id number]            Multiphasic, WNL                    +----------+--------+-------+----------------+-------------------+ +---------+--------+--+--------+--+---------+ VertebralPSV cm/s63EDV cm/s12Antegrade +---------+--------+--+--------+--+---------+  Left Carotid Findings: +----------+--------+-------+--------+---------------------------+-------------+           PSV cm/sEDV    StenosisPlaque Description         Comments                         cm/s                                                    +----------+--------+-------+--------+---------------------------+-------------+ CCA Prox  87      26     <50%    hyperechoic and                                                           heterogenous                             +----------+--------+-------+--------+---------------------------+-------------+ CCA Distal95      20                                                      +----------+--------+-------+--------+---------------------------+-------------+  ICA Prox                                                    CCA/ICA stent +----------+--------+-------+--------+---------------------------+-------------+ ICA Distal89      35                                                      +----------+--------+-------+--------+---------------------------+-------------+ ECA       275     25     >50%                                             +----------+--------+-------+--------+---------------------------+-------------+ +----------+--------+--------+----------------+-------------------+           PSV cm/sEDV cm/sDescribe        Arm Pressure (mmHG) +----------+--------+--------+----------------+-------------------+ [id number]             Multiphasic, WNL                    +----------+--------+--------+----------------+-------------------+ +---------+--------+--+--------+--+---------+ VertebralPSV cm/s51EDV cm/s17Antegrade +---------+--------+--+--------+--+---------+  Left Stent(s): +---------------+---+--+---------------++--------------------------------------+ Prox to Stent  85 22                                                      +---------------+---+--+---------------++--------------------------------------+ Proximal Stent 86 20                                                       +---------------+---+--+---------------++--------------------------------------+ Mid Stent      1191478-29% stenosisFocal, calcific plaque with posterior                                     shadowing, lower end of range          +---------------+---+--+---------------++--------------------------------------+ Distal Stent   14430                                                      +---------------+---+--+---------------++--------------------------------------+ Distal to [phone]                                                      +---------------+---+--+---------------++--------------------------------------+    Summary: Right Carotid: Velocities in the right ICA are consistent with a 1-39% stenosis.                Non-hemodynamically significant plaque <50% noted in the CCA. Left Carotid: Non-hemodynamically significant plaque <50% noted in the CCA. The  ECA appears >50% stenosed. Patent left ICA stent. Focal, calcific               plaque at the mid segments with 50-75% stenosis, lower end of               range. Vertebrals:  Bilateral vertebral arteries demonstrate antegrade flow. Subclavians: Normal flow hemodynamics were seen in bilateral subclavian              arteries. *See table(s) above for measurements and observations.  Electronically signed by Delia Heady MD on 10/15/2022 at 12:07:37 PM.    Final    Overnight EEG with video  Result Date: 10/15/2022 Charlsie Quest, MD     10/15/2022 10:10 AM Patient Name: Jimmy Duran MRN: [id number] Epilepsy Attending: Charlsie Quest Referring Physician/Provider: Mathews Argyle, NP Duration: 10/14/2022 5784 to 10/15/2022 6962 Patient history:  67yo M with sudden right sided weakness getting eeg to evaluate for seizure.  Level of alertness:  awake, asleep  AEDs during EEG study: None  Technical aspects: This EEG study was done with scalp electrodes positioned according to the 10-20 International system of electrode  placement. Electrical activity was reviewed with band pass filter of 1-70Hz , sensitivity of 7 uV/mm, display speed of 68mm/sec with a 60Hz  notched filter applied as appropriate. EEG data were recorded continuously and digitally stored.  Video monitoring was available and reviewed as appropriate.  Description: The posterior dominant rhythm consists of 7.5 Hz activity of moderate voltage (25-35 uV) seen predominantly in posterior head regions, symmetric and reactive to eye opening and eye closing. Sleep was characterized by vertex waves, maximal frontocentral region. EEG showed continuous 3 to 6 Hz theta-delta slowing left hemisphere as well as intermittent generalized 3-6hz  theta-delta slowing. Hyperventilation and photic stimulation were not performed.    ABNORMALITY - Continuous slow, left hemisphere - Intermittent slow, generalized   IMPRESSION: This study is suggestive of cortical dysfunction arising from left hemisphere likely secondary to underlying structural abnormality, post-ictal state. Additionally there is mild diffuse encephalopathy.. No seizures or epileptiform discharges were seen throughout the recording.  Charlsie Quest   DG CHEST PORT 1 VIEW  Result Date: 10/14/2022 CLINICAL DATA:  Aspiration EXAM: PORTABLE CHEST 1 VIEW COMPARISON:  Chest x-ray dated October 12, 2022 FINDINGS: Interval removal of enteric tube. Stable position of ETT. Lungs are clear. Evidence of pleural effusion or pneumothorax. IMPRESSION: Lungs are clear. Electronically Signed   By: Allegra Lai M.D.   On: 10/14/2022 13:47   MR BRAIN WO CONTRAST  Result Date: 10/13/2022 CLINICAL DATA:  Stroke follow-up EXAM: MRI HEAD WITHOUT CONTRAST TECHNIQUE: Multiplanar, multiecho pulse sequences of the brain and surrounding structures were obtained without intravenous contrast. COMPARISON:  None Available. FINDINGS: Brain: No acute infarct, mass effect or extra-axial collection. Hemosiderin deposition at the superior left parietal  lobe. There is multifocal hyperintense T2-weighted signal within the white matter. Parenchymal volume and CSF spaces are normal. The midline structures are normal. Vascular: Major flow voids are preserved. Skull and upper cervical spine: Normal calvarium and skull base. Visualized upper cervical spine and soft tissues are normal. Sinuses/Orbits:No paranasal sinus fluid levels or advanced mucosal thickening. No mastoid or middle ear effusion. Normal orbits. IMPRESSION: 1. No acute intracranial abnormality. 2. Chronic small vessel ischemia. Electronically Signed   By: Deatra Robinson M.D.   On: 10/13/2022 20:09   ECHOCARDIOGRAM COMPLETE BUBBLE STUDY  Result Date: 10/13/2022    ECHOCARDIOGRAM REPORT   Patient Name:   Jimmy Duran Date of Exam: 10/13/2022 Medical Rec #:  [id number]        Height:       67.0 in Accession #:    [id number]       Weight:       133.4 lb Date of Birth:  08/03/1955        BSA:          1.702 m Patient Age:    66 years         BP:           85/57 mmHg Patient Gender: M                HR:           76 bpm. Exam Location:  Inpatient Procedure: 2D Echo, Saline Contrast Bubble Study, Cardiac Doppler and Color            Doppler Indications:    stroke  History:        Patient has prior history of Echocardiogram examinations, most                 recent 02/24/2022. COPD; Risk Factors:Hypertension,                 Dyslipidemia, Current Smoker and Diabetes.  Sonographer:    Delcie Roch RDCS Referring Phys: [id number]  S   Sonographer Comments: Echo performed with patient supine and on artificial respirator. Image acquisition challenging due to respiratory motion and Image acquisition challenging due to COPD. IMPRESSIONS  1. Left ventricular ejection fraction, by estimation, is 55 to 60%. The left ventricle has normal function. The left ventricle has no regional wall motion abnormalities. There is moderate concentric left ventricular hypertrophy. Left ventricular diastolic parameters are  consistent with Grade I diastolic dysfunction (impaired relaxation).  2. Right ventricular systolic function is normal. The right ventricular size is normal.  3. The mitral valve is normal in structure. No evidence of mitral valve regurgitation. No evidence of mitral stenosis.  4. The aortic valve was not well visualized. Aortic valve regurgitation is trivial. No aortic stenosis is present.  5. The inferior vena cava is normal in size with greater than 50% respiratory variability, suggesting right atrial pressure of 3 mmHg.  6. Agitated saline contrast bubble study was negative, with no evidence of any interatrial shunt. Comparison(s): No significant change from prior study. Prior images reviewed side by side. FINDINGS  Left Ventricle: Left ventricular ejection fraction, by estimation, is 55 to 60%. The left ventricle has normal function. The left ventricle has no regional wall motion abnormalities. The left ventricular internal cavity size was normal in size. There is  moderate concentric left ventricular hypertrophy. Left ventricular diastolic parameters are consistent with Grade I diastolic dysfunction (impaired relaxation). Right Ventricle: The right ventricular size is normal. No increase in right ventricular wall thickness. Right ventricular systolic function is normal. Left Atrium: Left atrial size was normal in size. Right Atrium: Right atrial size was normal in size. Pericardium: Trivial pericardial effusion is present. The pericardial effusion is anterior to the right ventricle. Presence of epicardial fat layer. Mitral Valve: The mitral valve is normal in structure. No evidence of mitral valve regurgitation. No evidence of mitral valve stenosis. Tricuspid Valve: The tricuspid valve is normal in structure. Tricuspid valve regurgitation is not demonstrated. No evidence of tricuspid stenosis. Aortic Valve: The aortic valve was not well visualized. Aortic valve regurgitation is trivial. No aortic stenosis is  present. Pulmonic Valve: The pulmonic  valve was not well visualized. Pulmonic valve regurgitation is not visualized. No evidence of pulmonic stenosis. Aorta: The aortic root and ascending aorta are structurally normal, with no evidence of dilitation. Venous: The inferior vena cava is normal in size with greater than 50% respiratory variability, suggesting right atrial pressure of 3 mmHg. IAS/Shunts: No atrial level shunt detected by color flow Doppler. Agitated saline contrast was given intravenously to evaluate for intracardiac shunting. Agitated saline contrast bubble study was negative, with no evidence of any interatrial shunt.  LEFT VENTRICLE PLAX 2D LVIDd:         4.10 cm   Diastology LVIDs:         2.90 cm   LV e' medial:    5.44 cm/s LV PW:         1.30 cm   LV E/e' medial:  12.5 LV IVS:        1.10 cm   LV e' lateral:   7.51 cm/s LVOT diam:     2.10 cm   LV E/e' lateral: 9.0 LV SV:         56 LV SV Index:   33 LVOT Area:     3.46 cm  RIGHT VENTRICLE             IVC RV Basal diam:  2.50 cm     IVC diam: 1.90 cm RV S prime:     11.50 cm/s TAPSE (M-mode): 1.6 cm LEFT ATRIUM             Index        RIGHT ATRIUM           Index LA diam:        3.40 cm 2.00 cm/m   RA Area:     10.00 cm LA Vol (A2C):   36.9 ml 21.68 ml/m  RA Volume:   19.60 ml  11.51 ml/m LA Vol (A4C):   26.5 ml 15.57 ml/m LA Biplane Vol: 31.6 ml 18.56 ml/m  AORTIC VALVE LVOT Vmax:   84.90 cm/s LVOT Vmean:  55.100 cm/s LVOT VTI:    0.163 m  AORTA Ao Root diam: 3.10 cm Ao Asc diam:  3.20 cm MITRAL VALVE MV Area (PHT): 3.53 cm    SHUNTS MV Decel Time: 215 msec    Systemic VTI:  0.16 m MV E velocity: 67.90 cm/s  Systemic Diam: 2.10 cm MV A velocity: 58.60 cm/s MV E/A ratio:  1.16 Riley Lam MD Electronically signed by Riley Lam MD Signature Date/Time: 10/13/2022/11:34:21 AM    Final    EEG adult  Result Date: 10/13/2022 Charlsie Quest, MD     10/13/2022 10:16 AM Patient Name: Jimmy Duran MRN: [id number] Epilepsy  Attending: Charlsie Quest Referring Physician/Provider: Mathews Argyle, NP Date: 10/13/2022 Duration: 25.08 mins Patient history: 67yo M with sudden right sided weakness getting eeg to evaluate for seizure. Level of alertness:  lethargic/sedated AEDs during EEG study: Propofol Technical aspects: This EEG study was done with scalp electrodes positioned according to the 10-20 International system of electrode placement. Electrical activity was reviewed with band pass filter of 1-70Hz , sensitivity of 7 uV/mm, display speed of 32mm/sec with a 60Hz  notched filter applied as appropriate. EEG data were recorded continuously and digitally stored.  Video monitoring was available and reviewed as appropriate. Description: EEG showed continuous generalized and lateralized left hemisphere 3 to 6 Hz theta-delta slowing with overriding 12-13hz  beta activity. Hyperventilation and photic stimulation were not performed.   ABNORMALITY -  Continuous slow, generalized and lateralized left hemisphere IMPRESSION: This study is suggestive of cortical dysfunction arising from left hemisphere likely secondary to underlying structural abnormality, post-ictal state. Additionally there is severe diffuse encephalopathy likely related to sedation. No seizures or epileptiform discharges were seen throughout the recording. Charlsie Quest   DG Chest Port 1 View  Result Date: 10/12/2022 CLINICAL DATA:  Intubated EXAM: PORTABLE CHEST 1 VIEW COMPARISON:  10/12/2022 at 2042 hours FINDINGS: Endotracheal tube terminates 3 cm above the carina. Lungs are clear.  No pleural effusion or pneumothorax. The heart is normal in size. Endotracheal tube coursing into the stomach. IMPRESSION: Endotracheal tube terminates 3 cm above the carina. Electronically Signed   By: Charline Bills M.D.   On: 10/12/2022 23:49   CT CHEST ABDOMEN PELVIS WO CONTRAST  Result Date: 10/12/2022 CLINICAL DATA:  Abdominal pain. EXAM: CT CHEST, ABDOMEN AND PELVIS WITHOUT  CONTRAST TECHNIQUE: Multidetector CT imaging of the chest, abdomen and pelvis was performed following the standard protocol without IV contrast. RADIATION DOSE REDUCTION: This exam was performed according to the departmental dose-optimization program which includes automated exposure control, adjustment of the mA and/or kV according to patient size and/or use of iterative reconstruction technique. COMPARISON:  Chest CT dated 09/06/2021 and CT abdomen pelvis dated 06/23/2020. FINDINGS: Evaluation of this exam is limited in the absence of intravenous contrast. CT CHEST FINDINGS Cardiovascular: There is no cardiomegaly or pericardial effusion. There is 3 vessel coronary vascular calcification. There is moderate atherosclerotic calcification of the thoracic aorta. No aneurysmal dilatation. The central pulmonary arteries are grossly unremarkable. Mediastinum/Nodes: No hilar or mediastinal adenopathy. The esophagus is grossly unremarkable. No mediastinal fluid collection. Lungs/Pleura: Mild centrilobular emphysema. No focal consolidation, pleural effusion, or pneumothorax. Mucus secretions noted in the bilateral mainstem bronchi. The central airways remain patent. Endotracheal tube with tip approximately 2 cm above the carina. Musculoskeletal: No acute osseous pathology. CT ABDOMEN PELVIS FINDINGS No intra-abdominal free air or free fluid. Hepatobiliary: The liver is unremarkable. No biliary dilatation. Cholecystectomy. No retained calcified stone noted in the central CBD. Pancreas: Unremarkable. No pancreatic ductal dilatation or surrounding inflammatory changes. Spleen: Normal in size without focal abnormality. Adrenals/Urinary Tract: Mild bilateral adrenal thickening/hyperplasia versus adenoma. There is a 2 cm right renal upper pole cyst. Mild bilateral hydronephrosis and hydroureter. There is bilateral perinephric stranding, new since the prior CT and nonspecific. Correlation with urinalysis recommended to exclude  UTI. Excreted contrast noted in the renal collecting system and ureter. The urinary bladder is distended with excreted contrast. Stomach/Bowel: There is moderate stool throughout the colon. There is sigmoid diverticulosis without active inflammatory changes. There is no bowel obstruction or active inflammation. The appendix is not visualized with certainty. No inflammatory changes identified in the right lower quadrant. Vascular/Lymphatic: Advanced aortoiliac atherosclerotic disease. There is a 2.4 cm infrarenal aortic ectasia. There is a stent in the proximal SMA. Evaluation of the vasculature is limited in the absence of intravenous contrast. The IVC is unremarkable. No portal venous gas. There is no adenopathy. Reproductive: The prostate and seminal vesicles are grossly unremarkable. No pelvic mass. Other: None Musculoskeletal: Osteopenia with degenerative changes. No acute osseous pathology. IMPRESSION: 1. No acute intrathoracic pathology. 2. Mild bilateral hydronephrosis and hydroureter with bilateral perinephric stranding. Correlation with urinalysis recommended to exclude UTI. 3. Sigmoid diverticulosis. No bowel obstruction. 4. Aortic Atherosclerosis (ICD10-I70.0) and Emphysema (ICD10-J43.9). Electronically Signed   By: Elgie Collard M.D.   On: 10/12/2022 21:00   DG Chest Portable 1 View  Result Date: 10/12/2022  CLINICAL DATA:  Status post intubation EXAM: PORTABLE CHEST 1 VIEW COMPARISON:  Chest radiograph dated 02/23/2022 FINDINGS: Lines/tubes: Endotracheal tube tip projects 4.2 cm above the carina. Lungs: Lungs are clear without focal consolidation. Pleura: No pneumothorax or pleural effusion. Bilateral costophrenic angles are not included within the field of view. Heart/mediastinum: The heart size and mediastinal contours are within normal limits. Bones: No acute osseous abnormality. IMPRESSION: 1. Endotracheal tube tip projects 4.2 cm above the carina. 2. No focal consolidations. Electronically  Signed   By: Agustin Cree M.D.   On: 10/12/2022 20:53   CT Head Wo Contrast  Result Date: 10/12/2022 CLINICAL DATA:  Neuro deficit, acute, stroke suspected post TNK mental changes EXAM: CT HEAD WITHOUT CONTRAST TECHNIQUE: Contiguous axial images were obtained from the base of the skull through the vertex without intravenous contrast. RADIATION DOSE REDUCTION: This exam was performed according to the departmental dose-optimization program which includes automated exposure control, adjustment of the mA and/or kV according to patient size and/or use of iterative reconstruction technique. COMPARISON:  CT head 10/12/2022, CT head 01/04/2014 FINDINGS: Brain: Patchy and confluent areas of decreased attenuation are noted throughout the deep and periventricular white matter of the cerebral hemispheres bilaterally, compatible with chronic microvascular ischemic disease. Left parietal encephalomalacia. No evidence of large-territorial acute infarction. No parenchymal hemorrhage. No mass lesion. No extra-axial collection. No mass effect or midline shift. No hydrocephalus. Basilar cisterns are patent. Vascular: No hyperdense vessel. Atherosclerotic calcifications are present within the cavernous internal carotid and vertebral arteries. Skull: No acute fracture or focal lesion. Sinuses/Orbits: Paranasal sinuses and mastoid air cells are clear. Bilateral lens replacement. Otherwise the orbits are unremarkable. Other: None. IMPRESSION: No acute intracranial abnormality. Electronically Signed   By: Tish Frederickson M.D.   On: 10/12/2022 20:35   CT ANGIO HEAD NECK W WO CM (CODE STROKE)  Result Date: 10/12/2022 CLINICAL DATA:  Code stroke EXAM: CT ANGIOGRAPHY HEAD AND NECK WITH AND WITHOUT CONTRAST TECHNIQUE: Multidetector CT imaging of the head and neck was performed using the standard protocol during bolus administration of intravenous contrast. Multiplanar CT image reconstructions and MIPs were obtained to evaluate the vascular  anatomy. Carotid stenosis measurements (when applicable) are obtained utilizing NASCET criteria, using the distal internal carotid diameter as the denominator. RADIATION DOSE REDUCTION: This exam was performed according to the departmental dose-optimization program which includes automated exposure control, adjustment of the mA and/or kV according to patient size and/or use of iterative reconstruction technique. CONTRAST:  75mL OMNIPAQUE IOHEXOL 350 MG/ML SOLN COMPARISON:  Same-day noncontrast head CT, CTA neck 09/10/2021, MRA head 01/04/2014 FINDINGS: CTA NECK FINDINGS Aortic arch: There is mild calcified plaque in the imaged aortic arch. The origins of the major branch vessels are patent. The subclavian arteries are patent to the level imaged with mild stenosis proximally on the right. Right carotid system: The right common, internal, and external carotid arteries are patent, without hemodynamically significant stenosis or occlusion. There is no evidence of dissection or aneurysm. Left carotid system: The left common carotid artery is patent with mild calcified plaque but no significant stenosis or occlusion. A stent is seen traversing the distal common carotid artery through the internal carotid artery. There is narrowing of the stent at the level of the bifurcation due to significant eccentric plaque resulting in approximately 75% stenosis, though measurement is difficult due to streak artifact from the stent. The internal carotid artery distal to the stent is widely patent. The external carotid artery is stenotic at its origin but  patent. There is no evidence of dissection or aneurysm. Vertebral arteries: There is severe stenosis of the left vertebral artery at its origin. There is moderate stenosis of the right vertebral artery at its origin. The remainder of the vertebral arteries are patent, without other hemodynamically significant stenosis or occlusion. There is no evidence of dissection or aneurysm.  Skeleton: There is degenerative change most advanced at C4-C5 and C5-C6 with reversal of the normal curvature. There is no acute osseous abnormality or suspicious osseous lesion. There is no visible canal hematoma. Other neck: The soft tissues of the neck are unremarkable. Upper chest: There is mild emphysema in the lung apices. There is debris in the trachea. Review of the MIP images confirms the above findings CTA HEAD FINDINGS Anterior circulation: There is calcified plaque in the intracranial ICAs resulting in severe stenosis of the right cavernous segment and left supraclinoid segment. The ICA termini are patent. The bilateral MCAs and ACAS are patent, without proximal stenosis or occlusion. The anterior communicating artery is normal. There is no aneurysm or AVM. Posterior circulation: There is calcified plaque in the bilateral V4 segments resulting in mild stenosis bilaterally. The basilar artery is patent with mild atherosclerotic plaque. The major cerebellar arteries appear patent. The bilateral PCAs are patent, with mild atherosclerotic irregularity but no proximal high-grade stenosis or occlusion. A small right posterior communicating artery is identified. There is no aneurysm or AVM. Venous sinuses: Patent. Anatomic variants: None. Review of the MIP images confirms the above findings IMPRESSION: 1. No emergent vascular finding. 2. Patent stent in the left common and internal carotid arteries with approximately 75% stenosis at the level of the bifurcation due to significant eccentric plaque. The internal carotid artery distal to the stent is widely patent. 3. Severe left and moderate right vertebral artery origin stenosis. 4. Calcified plaque in the intracranial ICAs resulting in severe stenosis of the right cavernous segment and left supraclinoid segment. Otherwise, patent intracranial vasculature with mild atherosclerotic irregularity but no other high-grade stenosis or occlusion. 5. Debris in the  trachea. Correlate with any signs or symptoms of aspiration. 6. Mild emphysema. These results were called by telephone at the time of interpretation on 10/12/2022 at 6:27 pm to provider Dr Wilford Corner, who verbally acknowledged these results. Electronically Signed   By: Lesia Hausen M.D.   On: 10/12/2022 18:33   CT HEAD CODE STROKE WO CONTRAST  Result Date: 10/12/2022 CLINICAL DATA:  Code stroke. Neuro deficit, acute, stroke suspected. Confusion. Aphasia. Weakness in the upper extremities bilaterally. Last known well at 1530 hours. EXAM: CT HEAD WITHOUT CONTRAST TECHNIQUE: Contiguous axial images were obtained from the base of the skull through the vertex without intravenous contrast. RADIATION DOSE REDUCTION: This exam was performed according to the departmental dose-optimization program which includes automated exposure control, adjustment of the mA and/or kV according to patient size and/or use of iterative reconstruction technique. COMPARISON:  CT head contrast 01/04/2014 FINDINGS: Brain: Cortical and subcortical white matter hypoattenuation is present in the high left parietal lobe without significant volume loss. Mild generalized atrophy is present. Moderate periventricular and diffuse subcortical white matter hypoattenuation is present bilaterally. No acute other cortical infarct is present. Insular ribbon is normal bilaterally. Basal ganglia are intact. The ventricles are of normal size. No significant extraaxial fluid collection is present. The brainstem and cerebellum are within normal limits. Midline structures are within normal limits. Vascular: Atherosclerotic calcifications are present within the cavernous internal carotid arteries and at the dural margin of both vertebral arteries.  Skull: Calvarium is intact. No focal lytic or blastic lesions are present. No significant extracranial soft tissue lesion is present. Sinuses/Orbits: The paranasal sinuses and mastoid air cells are clear. The globes and orbits  are within normal limits. ASPECTS [facility] Stroke Program Early CT Score) - Ganglionic level infarction (caudate, lentiform nuclei, internal capsule, insula, M1-M3 cortex): 6/7 - Supraganglionic infarction (M4-M6 cortex): 3/3 Total score (0-10 with 10 being normal): 9/10 IMPRESSION: 1. Cortical and subcortical white matter hypoattenuation in the high left parietal lobe without significant volume loss. This is concerning for an acute or subacute infarct. 2. No other acute cortical infarct. 3. Moderate periventricular and diffuse subcortical white matter hypoattenuation bilaterally. This likely reflects the sequela of chronic microvascular ischemia. 4. Aspects is 9/10. These results were called by telephone at the time of interpretation on 10/12/2022 at 6:01 pm to provider [facility] , who verbally acknowledged these results. Electronically Signed   By: Marin Roberts M.D.   On: 10/12/2022 18:01   US PELVIS LIMITED (TRANSABDOMINAL ONLY)  Result Date: 10/10/2022 CLINICAL DATA:  Bilateral groin pain. EXAM: ULTRASOUND OF BILATERAL GROIN SOFT TISSUES TECHNIQUE: Ultrasound examination of the groin soft tissues was performed in the area of clinical concern. COMPARISON:  CT, 06/23/2020. FINDINGS: No mass or enlarged or abnormal lymph node.  No fluid collection. No sonographic evidence of an inguinal hernia on either side. IMPRESSION: Negative exam.  No sonographic evidence of an inguinal hernia. Electronically Signed   By: Amie Portland M.D.   On: 10/10/2022 13:55       HISTORY OF PRESENT ILLNESS Jimmy Duran is an 67 y.o. male with a past medical history of multiple TIAs, bilateral carotid revascularizations due to carotid artery stenosis bilaterally, AAA, tobacco abuse, COPD, peripheral vascular disease and DM2 who initially presented to the [facility] emergency department for evaluation of sudden onset of right-sided weakness and aphasia. NIHSS 14. He was intubated for severe agitation to receive TNK and  prior to transfer to [facility].   HOSPITAL COURSE See HPI above. Patient was intubated at [facility] following severe agitation in order to give TNK. In [place] [facility], patient was extubated on 8/12. During intubating, stroke symptoms were not present as evident by limited exam. Following extubation, he had no stroke related symptoms. 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 Pt was started on Depakote XR 500 mg once daily and has 8 weeks follow up with neurology. Given stroke concerns, pt will continue DAPT therapy for 3wks and then aspirin 81 for life. No concerns or symptoms on the day of discharge.    DISCHARGE EXAM  PHYSICAL EXAM General:  Alert, well-nourished, well-developed patient in no acute distress Psych:  Mood and affect appropriate for situation CV: Regular rate and rhythm on monitor Respiratory:  Regular, unlabored respirations on room air GI: Abdomen soft and nontender  NEURO:  Mental Status: AA&Ox3  Speech/Language: speech is without dysarthria or aphasia.  Naming, repetition, fluency, and comprehension intact.  Cranial Nerves:  II: PERRL. Visual fields full.  III, IV, VI: EOMI. Eyelids elevate symmetrically.  V: Sensation is intact to light touch and  symmetrical to face.  VII: Smile is symmetrical.  VIII: hearing intact to voice. IX, X: Palate elevates symmetrically. Phonation is normal.  ON:GEXBMWUX shrug 5/5. XII: tongue is midline without fasciculations. Motor: 5/5 strength to all muscle groups tested.  Tone: is normal and bulk is normal Sensation- Intact to light touch  bilaterally. Extinction absent to light touch to DSS. Coordination: FTN intact bilaterally, HKS: no ataxia in BLE.No drift.  Gait- deferred   Discharge Diet       Diet   Diet Heart Room service appropriate? Yes; Fluid consistency: Thin   liquids  DISCHARGE PLAN Disposition: Home aspirin 81 mg daily and clopidogrel 75 mg daily for secondary stroke prevention for 3 weeks then aspirin 81 mg daily alone. Ongoing stroke risk factor control by Primary Care Physician at time of discharge Follow-up PCP Durwin Nora Lucina Mellow, MD in 2 weeks. Follow-up in [facility] in 8 weeks, office to schedule an appointment. Able to see NP in clinic.  30 minutes were spent preparing discharge.  Meryl Dare, MD PGY-1 Psychiatry Resident 10/15/2022, 12:23 PM  I have personally obtained history,examined this patient, reviewed notes, independently viewed imaging studies, participated in medical decision making and plan of care.ROS completed by me personally and pertinent positives fully documented  I have made any additions or clarifications directly to the above note. Agree with note above.    Delia Heady, MD Medical Director [facility] Pager: [phone] 10/15/2022 2:28 PM

## 2022-10-15 NOTE — Progress Notes (Signed)
LTM maint complete - no skin breakdown under: FP1,A1

## 2022-10-16 ENCOUNTER — Telehealth: Payer: Self-pay

## 2022-10-16 NOTE — Transitions of Care (Post Inpatient/ED Visit) (Signed)
10/16/2022  Name: Jimmy Duran MRN: [id number] DOB: 12-04-1955  Today's TOC FU Call Status: Today's TOC FU Call Status:: Successful TOC FU Call Completed TOC FU Call Complete Date: 10/16/22  Transition Care Management Follow-up Telephone Call Date of Discharge: 10/15/22 Discharge Facility: [facility]) Type of Discharge: Inpatient Admission Primary Inpatient Discharge Diagnosis:: Acute Ischemic Stroke How have you been since you were released from the hospital?: Better Any questions or concerns?: Yes Patient Questions/Concerns:: Patient would like to go to Physical therapy at [facility] outpatient therapy. Patient Questions/Concerns Addressed: Other: (Forwarded order for PT to [facility])  Items Reviewed: Did you receive and understand the discharge instructions provided?: Yes Medications obtained,verified, and reconciled?: Yes (Medications Reviewed) Any new allergies since your discharge?: No Dietary orders reviewed?: No Do you have support at home?: Yes People in Home: spouse Name of Support/Comfort Primary Source: Spouse  Medications Reviewed Today: Medications Reviewed Today     Reviewed by Jodelle Gross, RN (Case Manager) on 10/16/22 at 1437  Med List Status: <None>   Medication Order Taking? Sig Documenting Provider Last Dose Status Informant  albuterol (PROVENTIL) (2.5 MG/3ML) 0.083% nebulizer solution [id number] Yes Take 3 mLs (2.5 mg total) by nebulization every 6 (six) hours as needed for wheezing or shortness of breath. [name], MD Taking Active Spouse/Significant Other  albuterol (VENTOLIN HFA) 108 (90 Base) MCG/ACT inhaler [id number] Yes Inhale 2 puffs into the lungs every 6 (six) hours as needed for wheezing or shortness of breath. Donell Beers, FNP Taking Active Spouse/Significant Other  aspirin EC 81 MG tablet [id number] Yes Take 1 tablet (81 mg total) by mouth daily at 6 (six) AM. Swallow whole. Dara Lords, PA-C Taking Active  Spouse/Significant Other  clopidogrel (PLAVIX) 75 MG tablet [id number] Yes Take 1 tablet (75 mg total) by mouth daily. Meryl Dare, MD Taking Active   Continuous Blood Gluc Sensor (FREESTYLE LIBRE 3 SENSOR) [place] [id number] Yes 1 each by Does not apply route every 14 (fourteen) days. Place 1 sensor on the skin every 14 days. Use to check glucose continuously [name], MD Taking Active Spouse/Significant Other  dicyclomine (BENTYL) 20 MG tablet [id number]  TAKE 1 TABLET BY MOUTH 4 TIMES DAILY AS NEEDED FOR SPASMS [name], MD  Active Spouse/Significant Other  diphenhydrAMINE (BENADRYL) 25 MG tablet [id number]  Take 25 mg by mouth daily as needed for allergies.  [provider]  Active Spouse/Significant Other  divalproex (DEPAKOTE ER) 500 MG 24 hr tablet [id number] Yes Take 1 tablet (500 mg total) by mouth daily. Meryl Dare, MD Taking Active   fenofibrate (TRICOR) 145 MG tablet [id number] Yes Take 1 tablet (145 mg total) by mouth daily. Sharlene Dory, NP Taking Active Spouse/Significant Other  Glucagon (GVOKE HYPOPEN 2-PACK) 1 MG/0.2ML SOAJ [id number]  Inject 1 mg into the skin as needed (hypoglycemia). [name], MD  Active Spouse/Significant Other           Med Note Ephriam Knuckles, TAMMY   Tue May 28, 2022 10:18 AM)    insulin glargine (LANTUS) 100 UNIT/ML injection [id number] Yes Inject 0.24 mLs (24 Units total) into the skin daily. [name], MD Taking Active Spouse/Significant Other  lansoprazole (PREVACID) 30 MG capsule [id number] Yes TAKE 1 CAPSULE BY MOUTH TWICE DAILY BEFORE A MEAL Rourk, Gerrit Friends, MD Taking Active Spouse/Significant Other  lisinopril (ZESTRIL) 20 MG tablet [id number] Yes Take 1 tablet (20 mg total) by mouth daily. [name], MD Taking Active Spouse/Significant Other  metFORMIN (GLUCOPHAGE) 1000 MG tablet [id number] Yes TAKE 1 TABLET BY MOUTH TWICE DAILY WITH A MEAL Dixon, Lucina Mellow, MD Taking Active Spouse/Significant Other  metoprolol  tartrate (LOPRESSOR) 25 MG tablet [id number] Yes Take 1 tablet (25 mg total) by mouth 2 (two) times daily. Sharlene Dory, NP Taking Active Spouse/Significant Other  nitroGLYCERIN (NITROSTAT) 0.4 MG SL tablet [id number]  Place 1 tablet (0.4 mg total) under the tongue every 5 (five) minutes as needed for chest pain (Do not excieed more than 3 tablets in 15 minutes.). Sharlene Dory, NP  Expired 10/13/22 2359 Spouse/Significant Other  NOVOLOG 100 UNIT/ML injection [id number] Yes Inject 14 Units into the skin 3 (three) times daily with meals. [name], MD Taking Active Spouse/Significant Other  rosuvastatin (CRESTOR) 40 MG tablet [id number] Yes Take 1 tablet by mouth once daily Jonelle Sidle, MD Taking Active Spouse/Significant Other  Semaglutide,0.25 or 0.5MG /DOS, (OZEMPIC, 0.25 OR 0.5 MG/DOSE,) 2 MG/3ML SOPN [id number]  Inject 0.25 mg into the skin once a week. [name], MD  Active Spouse/Significant Other           Med Note Cristobal Goldmann Oct 13, 2022  4:58 PM) Not started            Home Care and Equipment/Supplies: Were Home Health Services Ordered?: No Any new equipment or medical supplies ordered?: No  Functional Questionnaire: Do you need assistance with bathing/showering or dressing?: No Do you need assistance with meal preparation?: No Do you need assistance with eating?: No Do you have difficulty maintaining continence: No Do you need assistance with getting out of bed/getting out of a chair/moving?: No Do you have difficulty managing or taking your medications?: No  Follow up appointments reviewed: PCP Follow-up appointment confirmed?: Yes Date of PCP follow-up appointment?: 11/06/22 Follow-up Provider: Maeola Sarah, NP Specialist Hospital Follow-up appointment confirmed?: Yes Date of Specialist follow-up appointment?: 12/05/22 Follow-Up Specialty Provider:: Sharlene Dory Do you need transportation to your follow-up appointment?: No Do you  understand care options if your condition(s) worsen?: Yes-patient verbalized understanding  SDOH Interventions Today    Flowsheet Row Most Recent Value  SDOH Interventions   Food Insecurity Interventions Intervention Not Indicated  Housing Interventions Intervention Not Indicated  Transportation Interventions Intervention Not Indicated      TOC Interventions Today    Flowsheet Row Most Recent Value  TOC Interventions   TOC Interventions Discussed/Reviewed TOC Interventions Discussed, TOC Interventions Reviewed, Arranged PCP follow up less than 12 days/Care Guide scheduled      Jodelle Gross, RN, BSN, CCM Care Management Coordinator [place] Phone: [phone]/Fax: [phone]

## 2022-10-18 ENCOUNTER — Telehealth: Payer: Self-pay

## 2022-10-18 NOTE — Patient Outreach (Signed)
Received a red flag Emmi stroke notification. I have assigned Roshanda Florance, RN to call for follow up and determine if there are any Case Management needs.    Laura Greeson, CBCS, CMAA [facility] Care Management Assistant [facility] Care Management [phone]  

## 2022-10-18 NOTE — Patient Outreach (Signed)
  Emmi Stroke Care Coordination Follow Up  10/18/2022 Name:  Jimmy Duran MRN:  [id number] DOB:  [birth year]  Subjective: Jimmy Duran is a 67 y.o. year old male who is a primary care patient of [name], MD An Emmi alert was received indicating patient responded to questions: Problems setting up rehab?. I reached out by phone to follow up on the alert. No answer at present-HIPAA compliant voicemail message left along with contact info.     Care Coordination Interventions:  No, not indicated   Follow up plan:  RN CM will make outreach attempt to patient again if no return call.    Encounter Outcome:  No Answer    Antionette Fairy, RN,BSN,CCM [facility] Care Management Care Management Community Coordinator Direct Phone: [phone] Toll Free: [phone] Fax: [phone]

## 2022-10-21 ENCOUNTER — Telehealth: Payer: Self-pay

## 2022-10-21 NOTE — Patient Outreach (Signed)
  Emmi Stroke Care Coordination Follow Up  10/21/2022 Name:  Jimmy Duran MRN:  [id number] DOB:  1955-07-26  Subjective: Jimmy Duran is a 67 y.o. year old male who is a primary care patient of [name], MD An Emmi alert was received indicating patient responded to questions: Problems setting up rehab?. I reached out by phone to follow up on the alert and spoke to  wife-Jimmy Duran .  She reports that patient is doing well overall. Denies any acute issues. He is eating well. No issues with elimination. No Pain. He has been up walking and moving around with cane. He still has some numbness to his right foot at times. Wife confirms pt has all his meds and are taking them as ordered. He has PCP appt on 11/07/22. They have not made neuro appt. Provided wife with office number for [facility] and she will call and make an appt. Reviewed and addressed red alert. Wife voices that [facility] was unable to accept PT referral as they do not provide neuro PT and they referred pt to [place] location. Wife voices that is too far for them to drive. She has already spoken with a nurse regarding that on 10/16/22 and [facility] sent to [facility] to see if they will accept pt. Wife is waiting to here from them. Provided her with contact info and she is aware it my take a few days for referral to be reviewed and processed. She will follow up. She will also discuss with PCP office about other therapy options available in their area. Wife does not want to drive to [facility] as it is over an hour drive. She denies any other RN CM needs or concerns at this time.  Care Coordination Interventions:  Yes, provided    TOC Interventions Today    Flowsheet Row Most Recent Value  TOC Interventions   TOC Interventions Discussed/Reviewed TOC Interventions Reviewed      Interventions Today    Flowsheet Row Most Recent Value  Chronic Disease   Chronic disease during today's visit Other  [post stroke]  General  Interventions   General Interventions Discussed/Reviewed General Interventions Discussed, Doctor Visits  Doctor Visits Discussed/Reviewed PCP, Doctor Visits Discussed, Specialist  PCP/Specialist Visits Compliance with follow-up visit  Education Interventions   Education Provided Provided Education  Provided Verbal Education On When to see the doctor, Medication  Nutrition Interventions   Nutrition Discussed/Reviewed Nutrition Discussed  Pharmacy Interventions   Pharmacy Dicussed/Reviewed Pharmacy Topics Discussed, Medications and their functions  Safety Interventions   Safety Discussed/Reviewed Safety Discussed, Home Safety  Home Safety Assistive Devices       Follow up plan:  Wife aware that she can call RN CM back if needed for any concerns or questions.   Advised caregiver that they would continue to get automated EMMI-Stroke post discharge calls to assess how they are doing following recent hospitalization and will receive a call from a nurse if any of their responses were abnormal. Caregiver voiced understanding and was appreciative of f/u call.  Encounter Outcome:  Pt. Visit Completed    Alessandra Grout [facility] Care Management Care Management Community Coordinator Direct Phone: [phone] Toll Free: [phone] Fax: [phone]

## 2022-10-24 ENCOUNTER — Other Ambulatory Visit: Payer: Self-pay

## 2022-10-24 DIAGNOSIS — R569 Unspecified convulsions: Secondary | ICD-10-CM

## 2022-10-24 DIAGNOSIS — I214 Non-ST elevation (NSTEMI) myocardial infarction: Secondary | ICD-10-CM

## 2022-11-06 ENCOUNTER — Encounter: Payer: Self-pay | Admitting: Family Medicine

## 2022-11-06 ENCOUNTER — Ambulatory Visit (INDEPENDENT_AMBULATORY_CARE_PROVIDER_SITE_OTHER): Payer: Medicare PPO | Admitting: Family Medicine

## 2022-11-06 VITALS — BP 142/86 | HR 87 | Ht 67.0 in | Wt 130.1 lb

## 2022-11-06 DIAGNOSIS — R569 Unspecified convulsions: Secondary | ICD-10-CM | POA: Diagnosis not present

## 2022-11-06 NOTE — Progress Notes (Signed)
Established Patient Office Visit  Subjective:  Patient ID: Jimmy Duran, male    DOB: 04-30-1955  Age: 67 y.o. MRN: [id number]  CC:  Chief Complaint  Patient presents with   Follow-up    Hospital f/u , d/c on 10/15/2022    HPI Jimmy Duran is a 67 y.o. male with past medical history of  multiple TIAs, bilateral carotid revascularizations due to carotid artery stenosis bilaterally, AAA, tobacco abuse, COPD, peripheral vascular disease and DM2 presents for f/u of hospital follow up  The patient experienced a focal seizure and was seen in the Emergency Department due to sudden onset right-sided weakness and aphasia. Due to severe agitation, he was intubated and received TNK before being transferred to [facility]. An MRI conducted at the hospital was negative for a new stroke but did show evidence of chronic scarring in the superior parietal lobe, which could be a potential source of seizures. 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  The patient was started on Depakote XR 500 mg once daily and instructed to follow up with neurology. He was also advised to continue taking Plavix 75 mg daily for three weeks, followed by aspirin 81 mg daily for life. Since his discharge from the hospital, the patient reports feeling well and has been compliant with his treatment regimen. He has scheduled a follow-up appointment with neurology for November 13, 2019, and will begin physical therapy on November 11, 2019.     Past Medical History:  Diagnosis Date   AAA (abdominal aortic aneurysm) (HCC)    Arthritis    Carotid artery disease (HCC)    Cataract    Colon polyp    COPD (chronic obstructive pulmonary disease) (HCC)    Coronary artery disease    Nonobstructive   Essential hypertension    Focal seizure (HCC) 10/15/2022   GERD (gastroesophageal reflux disease)    Hemorrhoids    History of TIA (transient ischemic attack)     Low back pain    Lumbar radiculopathy    Mixed hyperlipidemia    Palpitations    Peripheral vascular disease (HCC)    Shingles 09/23/2019   Stroke (HCC)    Tobacco dependence    Type 2 diabetes mellitus (HCC)     Past Surgical History:  Procedure Laterality Date   APPENDECTOMY  1970   BIOPSY  12/08/2017   Procedure: BIOPSY;  Surgeon: Corbin Ade, MD;  [facility];  Service: Endoscopy;;  esophagus    BIOPSY  12/28/2020   Procedure: BIOPSY;  Surgeon: Corbin Ade, MD;  [facility];  Service: Endoscopy;;   BIOPSY  07/12/2021   Procedure: BIOPSY;  Surgeon: Corbin Ade, MD;  [facility];  Service: Endoscopy;;   BREAST SURGERY Right    Cyst resection on the right   CARDIAC CATHETERIZATION  1990's   in [place]. no stent placement   CATARACT EXTRACTION W/PHACO Right 06/18/2016   Procedure: CATARACT EXTRACTION PHACO AND INTRAOCULAR LENS PLACEMENT (IOC);  Surgeon: Galen Manila, MD;  Location: [facility];  Service: Ophthalmology;  Laterality: Right;  [name] 35.7 AP% 16.6 CDE 5.94 Fluid pack lot # [id number]   CATARACT EXTRACTION W/PHACO Left 07/16/2016   Procedure: CATARACT EXTRACTION PHACO AND INTRAOCULAR LENS PLACEMENT (IOC);  Surgeon: Galen Manila, MD;  Location: [facility];  Service: Ophthalmology;  Laterality: Left;  [name] 51.8 AP% 12.7 CDE 6.58 Fluid Pack Lot # [id number]  CHOLECYSTECTOMY  1993   COLONOSCOPY  02/12/2007   NGE:[id number] friable anal canal hemorrhoids, otherwise normal rectum and colon   COLONOSCOPY N/A 07/16/2012   RMR: Colonic diverticulosis. 4 mm tubular adenoma   COLONOSCOPY WITH PROPOFOL N/A 12/08/2017   Dr. Jena Gauss: sigmoid and descending colon diverticulosis, transverse colon polyp (TUBULAR ADENOMA)   COLONOSCOPY WITH PROPOFOL N/A 03/06/2022   Procedure: COLONOSCOPY WITH PROPOFOL;  Surgeon: Corbin Ade, MD;  [facility];  Service: Endoscopy;  Laterality: N/A;  12:30 PM   ENDARTERECTOMY Right  10/08/2019   Procedure: RIGHT CAROTID ENDARTERECTOMY;  Surgeon: Larina Earthly, MD;  Location: [facility];  Service: Vascular;  Laterality: Right;   ESOPHAGOGASTRODUODENOSCOPY (EGD) WITH ESOPHAGEAL DILATION N/A 07/16/2012   RMR: +Candida esophagitis, Erosive reflux esophagiits. Schatizi's ring status post dilation. Hiatal hernia. Antral erosions status post biopsy.    ESOPHAGOGASTRODUODENOSCOPY (EGD) WITH PROPOFOL N/A 12/08/2017   Dr. Jena Gauss: esophagitis, query EOE but negative for increased eosinophils on path, small hiatal hernia, normal stomach, normal duodenum, PAS stain with rare yeast forms on stain. Empiric dilation   ESOPHAGOGASTRODUODENOSCOPY (EGD) WITH PROPOFOL N/A 12/28/2020   esophagitis without bleeding, s/p biopsy and dilation. Antral erosions s/p biopsy.   ESOPHAGOGASTRODUODENOSCOPY (EGD) WITH PROPOFOL N/A 07/12/2021   Surgeon: Corbin Ade, MD;  Cobblestoning appearance of distal esophagus, slight narrowing of distal esophagus s/p dilation with 56 Fr and biopsy, gastric erosions biopsied, normal examined duodenum.  Esophageal biopsy with reactive squamous mucosa, gastric biopsy with reactive gastropathy, negative for H. pylori.   ESOPHAGOGASTRODUODENOSCOPY (EGD) WITH PROPOFOL N/A 03/06/2022   Procedure: ESOPHAGOGASTRODUODENOSCOPY (EGD) WITH PROPOFOL;  Surgeon: Corbin Ade, MD;  [facility];  Service: Endoscopy;  Laterality: N/A;   EYE SURGERY Bilateral 2018   cataract   GIVENS CAPSULE STUDY N/A 03/13/2022   Procedure: GIVENS CAPSULE STUDY;  Surgeon: Corbin Ade, MD;  [facility];  Service: Endoscopy;  Laterality: N/A;  7:30am   HERNIA REPAIR     JOINT REPLACEMENT     KNEE SURGERY     Left knee arthroscopy torn medial meniscus grade 4 chondral changes medial femoral condyle tibial plateau   MALONEY DILATION N/A 12/08/2017   Procedure: MALONEY DILATION;  Surgeon: Corbin Ade, MD;  [facility];  Service: Endoscopy;  Laterality: N/A;    MALONEY DILATION N/A 12/28/2020   Procedure: [name] DILATION;  Surgeon: Corbin Ade, MD;  [facility];  Service: Endoscopy;  Laterality: N/A;   MALONEY DILATION N/A 07/12/2021   Procedure: [name] DILATION;  Surgeon: Corbin Ade, MD;  [facility];  Service: Endoscopy;  Laterality: N/A;   PATCH ANGIOPLASTY Right 10/08/2019   Procedure: PATCH ANGIOPLASTY of the right common carotid artery using hemashield plaltinum finesse patch;  Surgeon: Larina Earthly, MD;  [facility];  Service: Vascular;  Laterality: Right;   PERIPHERAL VASCULAR INTERVENTION  07/17/2020   Procedure: PERIPHERAL VASCULAR INTERVENTION;  Surgeon: Maeola Harman, MD;  Location: [facility] CV LAB;  Service: Cardiovascular;;   POLYPECTOMY  12/08/2017   Procedure: POLYPECTOMY;  Surgeon: Corbin Ade, MD;  [facility];  Service: Endoscopy;;  colon   POLYPECTOMY  03/06/2022   Procedure: POLYPECTOMY;  Surgeon: Corbin Ade, MD;  [facility];  Service: Endoscopy;;   TOTAL KNEE ARTHROPLASTY Left 04/27/2012   Procedure: TOTAL KNEE ARTHROPLASTY;  Surgeon: Vickki Hearing, MD;  Location: [facility];  Service: Orthopedics;  Laterality: Left;   TRANSCAROTID  ARTERY REVASCULARIZATION  Left 10/12/2021   Procedure: Left Transcarotid Artery Revascularization;  Surgeon: Maeola Harman, MD;  [facility];  Service: Vascular;  Laterality: Left;   UMBILICAL HERNIA REPAIR  1993   VISCERAL ANGIOGRAPHY N/A 07/17/2020   Procedure: mesenteric ANGIOGRAPHY;  Surgeon: Maeola Harman, MD;  Location: [facility] INVASIVE CV LAB;  Service: Cardiovascular;  Laterality: N/A;    Family History  Problem Relation Age of Onset   Dementia Mother    Hypertension Mother    Colon polyps Mother    Osteoporosis Mother    Cancer Father    Hypertension Father    Coronary artery disease Father        CABG in his 49's   Heart attack Father    Cancer - Lung Father    Heart  disease Brother        has a pacemaker   Emphysema Maternal Grandmother    Cancer Maternal Grandmother    Cancer - Lung Maternal Grandfather    Diabetes Maternal Grandfather    Cancer Paternal Grandmother    Heart attack Paternal Grandfather    Colon cancer Other        maternal great aunt   Colon cancer Other        paternal great aunt    Social History   Socioeconomic History   Marital status: Legally Separated    Spouse name: Not on file   Number of children: 5   Years of education: Not on file   Highest education level: GED or equivalent  Occupational History   Occupation: Disabled    Employer: [employer]  Tobacco Use   Smoking status: Every Day    Current packs/day: 0.50    Average packs/day: 0.5 packs/day for 30.0 years (15.0 ttl pk-yrs)    Types: Cigarettes   Smokeless tobacco: Former    Types: Chew    Quit date: 03/18/2018   Tobacco comments:    0.5 pk per day currently   Vaping Use   Vaping status: Former  Substance and Sexual Activity   Alcohol use: Not Currently    Comment: socially "beer and tomato juice"    Drug use: No   Sexual activity: Yes    Birth control/protection: None  Other Topics Concern   Not on file  Social History Narrative   Lives alone.    Social Determinants of Health   Financial Resource Strain: Low Risk  (06/18/2022)   Overall Financial Resource Strain (CARDIA)    Difficulty of Paying Living Expenses: Not hard at all  Food Insecurity: No Food Insecurity (10/21/2022)   Hunger Vital Sign    Worried About Running Out of Food in the Last Year: Never true    Ran Out of Food in the Last Year: Never true  Transportation Needs: No Transportation Needs (10/21/2022)   PRAPARE - Administrator, Civil Service (Medical): No    Lack of Transportation (Non-Medical): No  Physical Activity: Insufficiently Active (06/18/2022)   Exercise Vital Sign    Days of Exercise per Week: 4 days    Minutes of Exercise per Session: 30 min   Stress: No Stress Concern Present (06/18/2022)   [employer] of Occupational Health - Occupational Stress Questionnaire    Feeling of Stress : Not at all  Recent Concern: Stress - Stress Concern Present (03/29/2022)   [employer] of Occupational Health - Occupational Stress Questionnaire    Feeling of Stress : To some extent  Social Connections: Unknown (  06/18/2022)   Social Connection and Isolation Panel [NHANES]    Frequency of Communication with Friends and Family: Patient declined    Frequency of Social Gatherings with Friends and Family: Patient declined    Attends Religious Services: Patient declined    Database administrator or Organizations: No    Attends Engineer, structural: Never    Marital Status: Separated  Recent Concern: Social Connections - Socially Isolated (03/29/2022)   Social Connection and Isolation Panel [NHANES]    Frequency of Communication with Friends and Family: Never    Frequency of Social Gatherings with Friends and Family: Once a week    Attends Religious Services: Never    Database administrator or Organizations: No    Attends Banker Meetings: Never    Marital Status: Separated  Intimate Partner Violence: Not At Risk (03/29/2022)   Humiliation, Afraid, Rape, and Kick questionnaire    Fear of Current or Ex-Partner: No    Emotionally Abused: No    Physically Abused: No    Sexually Abused: No    Outpatient Medications Prior to Visit  Medication Sig Dispense Refill   albuterol (PROVENTIL) (2.5 MG/3ML) 0.083% nebulizer solution Take 3 mLs (2.5 mg total) by nebulization every 6 (six) hours as needed for wheezing or shortness of breath. 150 mL 1   albuterol (VENTOLIN HFA) 108 (90 Base) MCG/ACT inhaler Inhale 2 puffs into the lungs every 6 (six) hours as needed for wheezing or shortness of breath. 8 g 2   aspirin EC 81 MG tablet Take 1 tablet (81 mg total) by mouth daily at 6 (six) AM. Swallow whole. 30 tablet 12   clopidogrel  (PLAVIX) 75 MG tablet Take 1 tablet (75 mg total) by mouth daily. 20 tablet 0   Continuous Blood Gluc Sensor (FREESTYLE LIBRE 3 SENSOR) MISC 1 each by Does not apply route every 14 (fourteen) days. Place 1 sensor on the skin every 14 days. Use to check glucose continuously 2 each 2   dicyclomine (BENTYL) 20 MG tablet TAKE 1 TABLET BY MOUTH 4 TIMES DAILY AS NEEDED FOR SPASMS 30 tablet 0   diphenhydrAMINE (BENADRYL) 25 MG tablet Take 25 mg by mouth daily as needed for allergies.      divalproex (DEPAKOTE ER) 500 MG 24 hr tablet Take 1 tablet (500 mg total) by mouth daily. 30 tablet 1   fenofibrate (TRICOR) 145 MG tablet Take 1 tablet (145 mg total) by mouth daily. 90 tablet 3   Glucagon (GVOKE HYPOPEN 2-PACK) 1 MG/0.2ML SOAJ Inject 1 mg into the skin as needed (hypoglycemia). 0.2 mL 1   insulin glargine (LANTUS) 100 UNIT/ML injection Inject 0.24 mLs (24 Units total) into the skin daily. 10 mL PRN   lansoprazole (PREVACID) 30 MG capsule TAKE 1 CAPSULE BY MOUTH TWICE DAILY BEFORE A MEAL 60 capsule 11   lisinopril (ZESTRIL) 20 MG tablet Take 1 tablet (20 mg total) by mouth daily. 90 tablet 3   metoprolol tartrate (LOPRESSOR) 25 MG tablet Take 1 tablet (25 mg total) by mouth 2 (two) times daily. 60 tablet 6   NOVOLOG 100 UNIT/ML injection Inject 14 Units into the skin 3 (three) times daily with meals. 10 mL 11   rosuvastatin (CRESTOR) 40 MG tablet Take 1 tablet by mouth once daily 90 tablet 0   Semaglutide,0.25 or 0.5MG /DOS, (OZEMPIC, 0.25 OR 0.5 MG/DOSE,) 2 MG/3ML SOPN Inject 0.25 mg into the skin once a week. 3 mL 0   metFORMIN (GLUCOPHAGE) 1000 MG  tablet TAKE 1 TABLET BY MOUTH TWICE DAILY WITH A MEAL 60 tablet 0   nitroGLYCERIN (NITROSTAT) 0.4 MG SL tablet Place 1 tablet (0.4 mg total) under the tongue every 5 (five) minutes as needed for chest pain (Do not excieed more than 3 tablets in 15 minutes.). 25 tablet 3   No facility-administered medications prior to visit.    No Known  Allergies  ROS Review of Systems  Constitutional:  Negative for fatigue and fever.  Eyes:  Negative for visual disturbance.  Respiratory:  Negative for chest tightness and shortness of breath.   Cardiovascular:  Negative for chest pain and palpitations.  Neurological:  Negative for dizziness and headaches.      Objective:    Physical Exam HENT:     Head: Normocephalic.     Right Ear: External ear normal.     Left Ear: External ear normal.     Nose: No congestion.     Mouth/Throat:     Mouth: Mucous membranes are moist.  Eyes:     Extraocular Movements: Extraocular movements intact.     Pupils: Pupils are equal, round, and reactive to light.  Cardiovascular:     Rate and Rhythm: Regular rhythm. Bradycardia present.     Heart sounds: No murmur heard. Pulmonary:     Effort: No respiratory distress.     Breath sounds: Normal breath sounds.  Abdominal:     Tenderness: There is no right CVA tenderness or left CVA tenderness.  Musculoskeletal:     Right lower leg: No edema.     Left lower leg: No edema.  Neurological:     Mental Status: He is alert and oriented to person, place, and time.     GCS: GCS eye subscore is 4. GCS verbal subscore is 5. GCS motor subscore is 6.     Cranial Nerves: No facial asymmetry.     Motor: No atrophy.     Coordination: Coordination abnormal. Finger-Nose-Finger Test normal.     Gait: Gait abnormal.  Psychiatric:        Judgment: Judgment normal.     BP (!) 142/86 (BP Location: Left Arm)   Pulse 87   Ht 5\' 7"  (1.702 m)   Wt 130 lb 1.3 oz (59 kg)   SpO2 96%   BMI 20.37 kg/m  Wt Readings from Last 3 Encounters:  11/06/22 130 lb 1.3 oz (59 kg)  10/15/22 148 lb 2.4 oz (67.2 kg)  09/17/22 133 lb 6.4 oz (60.5 kg)    Lab Results  Component Value Date   TSH 1.790 10/18/2013   Lab Results  Component Value Date   WBC 6.6 11/06/2022   HGB 14.1 11/06/2022   HCT 42.5 11/06/2022   MCV 93 11/06/2022   PLT 315 11/06/2022   Lab Results   Component Value Date   NA 141 11/06/2022   K 4.0 11/06/2022   CO2 23 11/06/2022   GLUCOSE 138 (H) 11/06/2022   BUN 7 (L) 11/06/2022   CREATININE 0.85 11/06/2022   BILITOT 0.5 10/12/2022   ALKPHOS 44 10/12/2022   AST 17 10/12/2022   ALT 11 10/12/2022   PROT 7.0 10/12/2022   ALBUMIN 4.2 10/12/2022   CALCIUM 10.1 11/06/2022   ANIONGAP 11 10/15/2022   EGFR 97 11/06/2022   Lab Results  Component Value Date   CHOL 85 10/14/2022   Lab Results  Component Value Date   HDL 35 (L) 10/14/2022   Lab Results  Component Value Date   LDLCALC  8 10/14/2022   Lab Results  Component Value Date   TRIG 212 (H) 10/14/2022   Lab Results  Component Value Date   CHOLHDL 2.4 10/14/2022   Lab Results  Component Value Date   HGBA1C 8.6 (H) 10/13/2022      Assessment & Plan:  Focal seizure [facility]) Assessment & Plan: Labs and imaging studies were reviewed during the patient's visit. The patient is encouraged to continue taking Depakote 500 mg daily and to adhere to the scheduled follow-up appointment with neurology. Today, we will also perform a complete blood count (CBC) and basic metabolic panel (BMP) to further assess the patient's health status.  Orders: -     CBC with Differential/Platelet -     BMP8+EGFR  Note: This chart has been completed using Engineer, civil (consulting) software, and while attempts have been made to ensure accuracy, certain words and phrases may not be transcribed as intended.    Follow-up: No follow-ups on file.   Gilmore Laroche, FNP

## 2022-11-06 NOTE — Patient Instructions (Addendum)
I appreciate the opportunity to provide care to you today!    Follow up: pcp  Labs: please stop by the lab today to get your blood drawn (CBC, BMP)  Secondary Stroke Prevention:Medication: Take clopidogrel 75 mg daily for 3 weeks, in addition to aspirin 81 mg daily for life.  Seizure Management:Medication: Continue taking Depakote 500 mg daily. Follow up with neurology on November 13, 2022.  Tobacco Use Smoking Cessation: I strongly recommend smoking cessation as a vital measure for enhancing your overall health. Smoking significantly elevates the risk of serious health conditions, including cancer, chronic obstructive pulmonary disease (COPD), hypertension, cataracts, and various digestive issues. Additionally, smoking can cause oral health problems such as gum disease, mouth sores, tooth loss, and reduced taste and smell. It also irritates the throat and contributes to chronic coughing. Quitting smoking will substantially lower these risks and improve your quality of life.    Please continue to a heart-healthy diet and increase your physical activities. Try to exercise for at least five days a week.    It was a pleasure to see you and I look forward to continuing to work together on your health and well-being. Please do not hesitate to call the office if you need care or have questions about your care.  In case of emergency, please visit the Emergency Department for urgent care, or contact our clinic at [phone] to schedule an appointment. We're here to help you!   Have a wonderful day and week. With Gratitude, Gilmore Laroche MSN, FNP-BC

## 2022-11-07 ENCOUNTER — Other Ambulatory Visit: Payer: Self-pay | Admitting: Internal Medicine

## 2022-11-07 ENCOUNTER — Telehealth: Payer: Self-pay | Admitting: Internal Medicine

## 2022-11-07 LAB — CBC WITH DIFFERENTIAL/PLATELET
Basophils Absolute: 0.2 10*3/uL (ref 0.0–0.2)
Basos: 2 %
EOS (ABSOLUTE): 0.2 10*3/uL (ref 0.0–0.4)
Eos: 4 %
Hematocrit: 42.5 % (ref 37.5–51.0)
Hemoglobin: 14.1 g/dL (ref 13.0–17.7)
Immature Grans (Abs): 0 10*3/uL (ref 0.0–0.1)
Immature Granulocytes: 1 %
Lymphocytes Absolute: 1.6 10*3/uL (ref 0.7–3.1)
Lymphs: 25 %
MCH: 30.7 pg (ref 26.6–33.0)
MCHC: 33.2 g/dL (ref 31.5–35.7)
MCV: 93 fL (ref 79–97)
Monocytes Absolute: 0.8 10*3/uL (ref 0.1–0.9)
Monocytes: 13 %
Neutrophils Absolute: 3.7 10*3/uL (ref 1.4–7.0)
Neutrophils: 55 %
Platelets: 315 10*3/uL (ref 150–450)
RBC: 4.59 x10E6/uL (ref 4.14–5.80)
RDW: 13.6 % (ref 11.6–15.4)
WBC: 6.6 10*3/uL (ref 3.4–10.8)

## 2022-11-07 LAB — BMP8+EGFR
BUN/Creatinine Ratio: 8 — ABNORMAL LOW (ref 10–24)
BUN: 7 mg/dL — ABNORMAL LOW (ref 8–27)
CO2: 23 mmol/L (ref 20–29)
Calcium: 10.1 mg/dL (ref 8.6–10.2)
Chloride: 100 mmol/L (ref 96–106)
Creatinine, Ser: 0.85 mg/dL (ref 0.76–1.27)
Glucose: 138 mg/dL — ABNORMAL HIGH (ref 70–99)
Potassium: 4 mmol/L (ref 3.5–5.2)
Sodium: 141 mmol/L (ref 134–144)
eGFR: 97 mL/min/{1.73_m2} (ref >59)

## 2022-11-07 NOTE — Telephone Encounter (Signed)
Returned call

## 2022-11-07 NOTE — Telephone Encounter (Signed)
[employer] pt assistance program calling wishing to speak with someone regarding pt approval for the program. Please advise [phone]. Thank you

## 2022-11-09 NOTE — Assessment & Plan Note (Signed)
Labs and imaging studies were reviewed during the patient's visit. The patient is encouraged to continue taking Depakote 500 mg daily and to adhere to the scheduled follow-up appointment with neurology. Today, we will also perform a complete blood count (CBC) and basic metabolic panel (BMP) to further assess the patient's health status.

## 2022-11-11 ENCOUNTER — Other Ambulatory Visit: Payer: Self-pay

## 2022-11-11 ENCOUNTER — Ambulatory Visit (HOSPITAL_COMMUNITY): Payer: Medicare PPO | Attending: Internal Medicine | Admitting: Physical Therapy

## 2022-11-11 DIAGNOSIS — R051 Acute cough: Secondary | ICD-10-CM | POA: Insufficient documentation

## 2022-11-11 DIAGNOSIS — R296 Repeated falls: Secondary | ICD-10-CM | POA: Diagnosis not present

## 2022-11-11 DIAGNOSIS — M6281 Muscle weakness (generalized): Secondary | ICD-10-CM | POA: Diagnosis not present

## 2022-11-11 DIAGNOSIS — E781 Pure hyperglyceridemia: Secondary | ICD-10-CM | POA: Diagnosis not present

## 2022-11-11 DIAGNOSIS — E785 Hyperlipidemia, unspecified: Secondary | ICD-10-CM | POA: Insufficient documentation

## 2022-11-11 DIAGNOSIS — R262 Difficulty in walking, not elsewhere classified: Secondary | ICD-10-CM | POA: Diagnosis not present

## 2022-11-11 DIAGNOSIS — Z720 Tobacco use: Secondary | ICD-10-CM | POA: Insufficient documentation

## 2022-11-11 DIAGNOSIS — I739 Peripheral vascular disease, unspecified: Secondary | ICD-10-CM | POA: Diagnosis not present

## 2022-11-11 DIAGNOSIS — R569 Unspecified convulsions: Secondary | ICD-10-CM | POA: Diagnosis not present

## 2022-11-11 DIAGNOSIS — I214 Non-ST elevation (NSTEMI) myocardial infarction: Secondary | ICD-10-CM | POA: Insufficient documentation

## 2022-11-11 DIAGNOSIS — I2510 Atherosclerotic heart disease of native coronary artery without angina pectoris: Secondary | ICD-10-CM | POA: Insufficient documentation

## 2022-11-11 DIAGNOSIS — R42 Dizziness and giddiness: Secondary | ICD-10-CM | POA: Diagnosis present

## 2022-11-11 DIAGNOSIS — Z8673 Personal history of transient ischemic attack (TIA), and cerebral infarction without residual deficits: Secondary | ICD-10-CM | POA: Insufficient documentation

## 2022-11-11 DIAGNOSIS — I1A0 Resistant hypertension: Secondary | ICD-10-CM | POA: Diagnosis not present

## 2022-11-11 DIAGNOSIS — I10 Essential (primary) hypertension: Secondary | ICD-10-CM | POA: Diagnosis not present

## 2022-11-11 DIAGNOSIS — R0602 Shortness of breath: Secondary | ICD-10-CM | POA: Insufficient documentation

## 2022-11-11 DIAGNOSIS — Z87898 Personal history of other specified conditions: Secondary | ICD-10-CM | POA: Diagnosis present

## 2022-11-11 NOTE — Therapy (Signed)
OUTPATIENT PHYSICAL THERAPY NEURO EVALUATION   Patient Name: Jimmy Duran MRN: [id number] DOB:Dec 17, 1955, 67 y.o., male Today's Date: 11/11/2022   PCP: Christel Mormon REFERRING PROVIDER: Micki Riley, MD  END OF SESSION:  PT End of Session - 11/11/22 1030     Visit Number 1    Number of Visits 12    Date for PT Re-Evaluation 12/23/22    Authorization Type [employer]    PT Start Time 0945    PT Stop Time 1020    PT Time Calculation (min) 35 min    Activity Tolerance Patient tolerated treatment well    Behavior During Therapy [facility] for tasks assessed/performed             Past Medical History:  Diagnosis Date   AAA (abdominal aortic aneurysm) (HCC)    Arthritis    Carotid artery disease (HCC)    Cataract    Colon polyp    COPD (chronic obstructive pulmonary disease) (HCC)    Coronary artery disease    Nonobstructive   Essential hypertension    Focal seizure (HCC) 10/15/2022   GERD (gastroesophageal reflux disease)    Hemorrhoids    History of TIA (transient ischemic attack)    Low back pain    Lumbar radiculopathy    Mixed hyperlipidemia    Palpitations    Peripheral vascular disease (HCC)    Shingles 09/23/2019   Stroke (HCC)    Tobacco dependence    Type 2 diabetes mellitus (HCC)    Past Surgical History:  Procedure Laterality Date   APPENDECTOMY  1970   BIOPSY  12/08/2017   Procedure: BIOPSY;  Surgeon: Corbin Ade, MD;  [facility];  Service: Endoscopy;;  esophagus    BIOPSY  12/28/2020   Procedure: BIOPSY;  Surgeon: Corbin Ade, MD;  [facility];  Service: Endoscopy;;   BIOPSY  07/12/2021   Procedure: BIOPSY;  Surgeon: Corbin Ade, MD;  [facility];  Service: Endoscopy;;   BREAST SURGERY Right    Cyst resection on the right   CARDIAC CATHETERIZATION  1990's   in [place]. no stent placement   CATARACT EXTRACTION W/PHACO Right 06/18/2016   Procedure: CATARACT EXTRACTION PHACO AND INTRAOCULAR LENS  PLACEMENT (IOC);  Surgeon: Galen Manila, MD;  Location: [facility];  Service: Ophthalmology;  Laterality: Right;  [name] 35.7 AP% 16.6 CDE 5.94 Fluid pack lot # [id number]   CATARACT EXTRACTION W/PHACO Left 07/16/2016   Procedure: CATARACT EXTRACTION PHACO AND INTRAOCULAR LENS PLACEMENT (IOC);  Surgeon: Galen Manila, MD;  Location: [facility];  Service: Ophthalmology;  Laterality: Left;  [name] 51.8 AP% 12.7 CDE 6.58 Fluid Pack Lot # [id number]   CHOLECYSTECTOMY  1993   COLONOSCOPY  02/12/2007   QIH:[id number] friable anal canal hemorrhoids, otherwise normal rectum and colon   COLONOSCOPY N/A 07/16/2012   RMR: Colonic diverticulosis. 4 mm tubular adenoma   COLONOSCOPY WITH PROPOFOL N/A 12/08/2017   Dr. Jena Gauss: sigmoid and descending colon diverticulosis, transverse colon polyp (TUBULAR ADENOMA)   COLONOSCOPY WITH PROPOFOL N/A 03/06/2022   Procedure: COLONOSCOPY WITH PROPOFOL;  Surgeon: Corbin Ade, MD;  [facility];  Service: Endoscopy;  Laterality: N/A;  12:30 PM   ENDARTERECTOMY Right 10/08/2019   Procedure: RIGHT CAROTID ENDARTERECTOMY;  Surgeon: Larina Earthly, MD;  Location: [facility];  Service: Vascular;  Laterality: Right;   ESOPHAGOGASTRODUODENOSCOPY (EGD) WITH ESOPHAGEAL DILATION N/A 07/16/2012   RMR: +Candida esophagitis, Erosive reflux esophagiits. Schatizi's ring status post  dilation. Hiatal hernia. Antral erosions status post biopsy.    ESOPHAGOGASTRODUODENOSCOPY (EGD) WITH PROPOFOL N/A 12/08/2017   Dr. Jena Gauss: esophagitis, query EOE but negative for increased eosinophils on path, small hiatal hernia, normal stomach, normal duodenum, PAS stain with rare yeast forms on stain. Empiric dilation   ESOPHAGOGASTRODUODENOSCOPY (EGD) WITH PROPOFOL N/A 12/28/2020   esophagitis without bleeding, s/p biopsy and dilation. Antral erosions s/p biopsy.   ESOPHAGOGASTRODUODENOSCOPY (EGD) WITH PROPOFOL N/A 07/12/2021   Surgeon: Corbin Ade, MD;  Cobblestoning appearance of distal  esophagus, slight narrowing of distal esophagus s/p dilation with 56 Fr and biopsy, gastric erosions biopsied, normal examined duodenum.  Esophageal biopsy with reactive squamous mucosa, gastric biopsy with reactive gastropathy, negative for H. pylori.   ESOPHAGOGASTRODUODENOSCOPY (EGD) WITH PROPOFOL N/A 03/06/2022   Procedure: ESOPHAGOGASTRODUODENOSCOPY (EGD) WITH PROPOFOL;  Surgeon: Corbin Ade, MD;  [facility];  Service: Endoscopy;  Laterality: N/A;   EYE SURGERY Bilateral 2018   cataract   GIVENS CAPSULE STUDY N/A 03/13/2022   Procedure: GIVENS CAPSULE STUDY;  Surgeon: Corbin Ade, MD;  [facility];  Service: Endoscopy;  Laterality: N/A;  7:30am   HERNIA REPAIR     JOINT REPLACEMENT     KNEE SURGERY     Left knee arthroscopy torn medial meniscus grade 4 chondral changes medial femoral condyle tibial plateau   MALONEY DILATION N/A 12/08/2017   Procedure: MALONEY DILATION;  Surgeon: Corbin Ade, MD;  [facility];  Service: Endoscopy;  Laterality: N/A;   MALONEY DILATION N/A 12/28/2020   Procedure: [name] DILATION;  Surgeon: Corbin Ade, MD;  [facility];  Service: Endoscopy;  Laterality: N/A;   MALONEY DILATION N/A 07/12/2021   Procedure: [name] DILATION;  Surgeon: Corbin Ade, MD;  [facility];  Service: Endoscopy;  Laterality: N/A;   PATCH ANGIOPLASTY Right 10/08/2019   Procedure: PATCH ANGIOPLASTY of the right common carotid artery using hemashield plaltinum finesse patch;  Surgeon: Larina Earthly, MD;  Location: [facility];  Service: Vascular;  Laterality: Right;   PERIPHERAL VASCULAR INTERVENTION  07/17/2020   Procedure: PERIPHERAL VASCULAR INTERVENTION;  Surgeon: Maeola Harman, MD;  [facility] INVASIVE CV LAB;  Service: Cardiovascular;;   POLYPECTOMY  12/08/2017   Procedure: POLYPECTOMY;  Surgeon: Corbin Ade, MD;  [facility];  Service: Endoscopy;;  colon   POLYPECTOMY   03/06/2022   Procedure: POLYPECTOMY;  Surgeon: Corbin Ade, MD;  [facility];  Service: Endoscopy;;   TOTAL KNEE ARTHROPLASTY Left 04/27/2012   Procedure: TOTAL KNEE ARTHROPLASTY;  Surgeon: Vickki Hearing, MD;  Location: [facility];  Service: Orthopedics;  Laterality: Left;   TRANSCAROTID ARTERY REVASCULARIZATION  Left 10/12/2021   Procedure: Left Transcarotid Artery Revascularization;  Surgeon: Maeola Harman, MD;  Location: [facility];  Service: Vascular;  Laterality: Left;   UMBILICAL HERNIA REPAIR  1993   VISCERAL ANGIOGRAPHY N/A 07/17/2020   Procedure: mesenteric ANGIOGRAPHY;  Surgeon: Maeola Harman, MD;  Location: [facility] INVASIVE CV LAB;  Service: Cardiovascular;  Laterality: N/A;   Patient Active Problem List   Diagnosis Date Noted   Focal seizure (HCC) 10/15/2022   Protein-calorie malnutrition, severe 10/14/2022   Stroke determined by clinical assessment (HCC) 10/12/2022   Bilateral groin pain 09/17/2022   Community acquired pneumonia 08/20/2022   Right-sided chest pain 08/20/2022   Dysuria 07/25/2022   Abdominal pain 06/19/2022   Occult GI bleeding 03/14/2022   NSTEMI (non-ST elevated myocardial infarction) (  HCC) 02/24/2022   Peripheral vascular disease (HCC) 02/24/2022   Acute on chronic blood loss anemia 02/24/2022   Acute blood loss anemia 02/24/2022   Acute otitis media 01/23/2022   Iron deficiency anemia, unspecified 12/25/2021   Need for influenza vaccination 12/14/2021   Unintentional weight loss 12/14/2021   Carotid stenosis, asymptomatic, left 10/12/2021   Asymptomatic carotid artery stenosis without infarction, right 10/08/2019   Abdominal pain, left lower quadrant 06/18/2018   History of colonic polyps 11/05/2017   Diarrhea 08/15/2014   Dizziness 01/04/2014   Transient ischemic attack 01/04/2014   Palpitations 04/22/2013   Esophageal dysphagia 07/02/2012   OA (osteoarthritis) of knee 06/17/2011   Gastroesophageal reflux disease  05/04/2010   DM type 2 causing vascular disease (HCC) 05/04/2010   COPD (chronic obstructive pulmonary disease) (HCC) 05/04/2010   Hemorrhoids 05/04/2010   Mixed hyperlipidemia 04/25/2010   Current smoker 04/25/2010   Essential hypertension, benign 04/25/2010   CORONARY ATHEROSCLEROSIS NATIVE CORONARY ARTERY 04/25/2010   CAROTID ARTERY DISEASE 04/25/2010    ONSET DATE: 10/12/22  REFERRING DIAG: I63.9 (ICD-10-CM) - Acute ischemic stroke (HCC) R56.9 (ICD-10-CM) - Focal seizure (HCC)  THERAPY DIAG:  Muscle weakness (generalized) - Plan: PT plan of care cert/re-cert  Difficulty in walking, not elsewhere classified - Plan: PT plan of care cert/re-cert  Repeated falls  Rationale for Evaluation and Treatment: Rehabilitation  SUBJECTIVE:                                                                                                                                                                                             SUBJECTIVE STATEMENT: Pt states that he has been trying to walk since he left the hospital but he is having pain in his Rt leg especially from the knee down pain will go as high as a 10/10.  He has had several mini strokes but never a completed stroke.  He is dragging his Right leg and wants to be able to walk better. 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  Pt accompanied by: self  PERTINENT HISTORY: per MD : Bruna Potter is an 67 y.o. male with a past medical history of multiple TIAs, bilateral carotid revascularizations due to carotid artery stenosis bilaterally, AAA, tobacco abuse, COPD, peripheral vascular disease and DM2 who initially presented to the [facility] emergency department for evaluation of sudden onset of right-sided weakness and aphasia. NIHSS 14. He was intubated for severe agitation to receive TNK and prior to transfer to [facility].  In [place] [facility], patient was extubated on 8/12. During intubating, stroke symptoms were not present as  evident  by limited exam. Following extubation, he had no stroke related symptoms. 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 PAIN:  Are you having pain? No  PRECAUTIONS: Fall    FALLS: Has patient fallen in last 6 months? Yes. Number of falls 3  LIVING ENVIRONMENT: Lives with: lives with their family Lives in: House/apartment Stairs: Yes: External: 1 steps; none Has following equipment at home: Quad cane large base and None; does not like the quad cane.   PLOF: Independent  PATIENT GOALS: get back as normal as I can  OBJECTIVE:   DIAGNOSTIC FINDINGS: see above   COGNITION: Overall cognitive status: Within functional limits for tasks assessed     LOWER EXTREMITY ROM:   WFL LOWER EXTREMITY MMT:    MMT Right Eval Left Eval  Hip flexion 3+   Hip extension 3   Hip abduction 4   Hip adduction    Hip internal rotation    Hip external rotation    Knee flexion 3   Knee extension 3+   Ankle dorsiflexion 3-   Ankle plantarflexion    Ankle inversion    Ankle eversion    (Blank rows = not tested)  BED MOBILITY:  I   TRANSFERS: I FUNCTIONAL TESTS:             Single leg stance:  Rt: 0"   , Lt :  8" 5 times sit to stand: 13.7 30 seconds chair stand test : 11 ,( 9 is poor, 13 below average for age and sex) 2 minute walk test: 248 ft    TODAY'S TREATMENT:                                                                                                                              DATE: Seated Ankle Pumps  - 2 x daily - 7 x weekly - 1 sets - 10 reps - 3-5" hold - Sit to Stand  -  2 x daily - 7 x weekly - 1 sets - 10 reps - Seated Long Arc Quad  - 2 x daily - 7 x weekly - 1 sets - 10 reps - 3-5 hold             -bridge x 10    PATIENT EDUCATION: Education details: HEP Person educated: Patient Education method: Explanation, Verbal cues, and Handouts Education comprehension: verbalized understanding and returned demonstration  HOME EXERCISE PROGRAM: Access Code: [email or web address] URL: [email or web address] Date: 11/11/2022 Prepared by: Virgina Organ  Exercises - Seated Ankle Pumps  - 2 x daily - 7 x weekly - 1  sets - 10 reps - 3-5" hold - Sit to Stand  - 2 x daily - 7 x weekly - 1 sets - 10 reps - Seated Long Arc Quad  - 2 x daily - 7 x weekly - 1 sets - 10 reps - 3-5 hold             -bridge x 10  GOALS: Goals reviewed with patient? No  SHORT TERM GOALS: Target date: 12/02/22  PT I in HEP in order to decrease his leg pain to no greater than a 6/10 Baseline: Goal status: INITIAL  2.  Pt strength to be increased 1/2 grade to allow to be able to come sit to stand from low couch Baseline:  Goal status: INITIAL  3.  PT to be able to single leg stance on both LE for 10 seconds to decrease risk of falling  Baseline:  Goal status: INITIAL   LONG TERM GOALS: Target date: 12/23/22  PT I in HEP in order to decrease his leg pain to no greater than a 3/10 Baseline:  Goal status: INITIAL  2.  Pt strength to be increased 1/2 grade to allow to be able to go up and down 12 steps in a reciprocal manner  Baseline:  Goal status: INITIAL  3.  PT to be able to single leg stance on both LE for 20 seconds to decrease risk of falling Baseline:  Goal status: INITIAL  4.  PT to be back working with  his wood working  Baseline:  Goal status: INITIAL  L  ASSESSMENT:  CLINICAL IMPRESSION: Patient is a 67 y.o. male who was seen today for physical therapy evaluation and treatment for CVA.  Examination demonstrates decreased ROM, decreased strength,  decreased activity tolerance and decreased balance.  Mr. Sensat will benefit from skilled PT to address these issues and maximize his functional ability.   OBJECTIVE IMPAIRMENTS: Abnormal gait, decreased activity tolerance, decreased balance, difficulty walking, decreased ROM, decreased strength, and pain.   ACTIVITY LIMITATIONS: carrying, lifting, standing, and locomotion level  PARTICIPATION LIMITATIONS: shopping, community activity, and wood working   [facility] POTENTIAL: Good  CLINICAL DECISION MAKING: Stable/uncomplicated  EVALUATION COMPLEXITY: Moderate  PLAN:  PT FREQUENCY: 2x/week  PT DURATION: 6 weeks  PLANNED INTERVENTIONS: Therapeutic exercises, Therapeutic activity, Neuromuscular re-education, Balance training, Gait training, Patient/Family education, and Self Care  PLAN FOR NEXT SESSION: Stretching for RT calf continue with strengthening and balance activity.   Virgina Organ, PT CLT [phone]  11/11/2022, 10:31 AM

## 2022-11-12 ENCOUNTER — Ambulatory Visit (INDEPENDENT_AMBULATORY_CARE_PROVIDER_SITE_OTHER): Payer: Medicare PPO | Admitting: Nurse Practitioner

## 2022-11-12 ENCOUNTER — Encounter: Payer: Self-pay | Admitting: Nurse Practitioner

## 2022-11-12 VITALS — BP 161/95 | HR 100 | Wt 131.0 lb

## 2022-11-12 DIAGNOSIS — E781 Pure hyperglyceridemia: Secondary | ICD-10-CM

## 2022-11-12 DIAGNOSIS — I2510 Atherosclerotic heart disease of native coronary artery without angina pectoris: Secondary | ICD-10-CM

## 2022-11-12 DIAGNOSIS — Z720 Tobacco use: Secondary | ICD-10-CM | POA: Diagnosis not present

## 2022-11-12 DIAGNOSIS — I739 Peripheral vascular disease, unspecified: Secondary | ICD-10-CM

## 2022-11-12 DIAGNOSIS — Z87898 Personal history of other specified conditions: Secondary | ICD-10-CM

## 2022-11-12 DIAGNOSIS — E785 Hyperlipidemia, unspecified: Secondary | ICD-10-CM

## 2022-11-12 DIAGNOSIS — I10 Essential (primary) hypertension: Secondary | ICD-10-CM

## 2022-11-12 DIAGNOSIS — Z8673 Personal history of transient ischemic attack (TIA), and cerebral infarction without residual deficits: Secondary | ICD-10-CM

## 2022-11-12 DIAGNOSIS — R42 Dizziness and giddiness: Secondary | ICD-10-CM | POA: Diagnosis not present

## 2022-11-12 MED ORDER — METOPROLOL TARTRATE 50 MG PO TABS: 50.0000 mg | ORAL_TABLET | Freq: Two times a day (BID) | ORAL | 1 refills | Status: DC

## 2022-11-12 NOTE — Progress Notes (Signed)
Cardiology Office Note:    Date:  11/12/2022  ID:  Jimmy Duran, DOB 06/02/1955, MRN [id number]  PCP:  [name], MD   [facility] Providers Cardiologist:  [name], MD     Referring MD: [name], MD   CC: Here for follow-up  History of Present Illness:    Jimmy Duran is a 67 y.o. male with a PMH of CAD, carotid stenosis, s/p R CEA (2021) and left TCAR -8/23 (followed by [facility]), HTN, T2DM, hx of multiple TIA's/CVA, COPD, GERD, palpitations, dizziness, hx of seizure, and IDA, who presents today for scheduled follow-up.   Previous cardiovascular history includes nonobstructive CAD and low risk stress test in 2015  In 2021 he underwent right CEA, and underwent previous stenting to superior mesenteric artery in 2022.  Admitted to [facility] in August 2023 for arranged left TCAR, no immediate complications.  Presented back to [name] ED on October 27, 2021 for worsening dizziness x 3 weeks.  Received IV fluids due to poor p.o. intake.  Lopressor was reduced and was recommended to follow-up with cardiology outpatient.  Seen by [name], PA-C on November 22, 2021 for hospital follow-up. Pt noted worsening lightheadedness x 1 month, not triggered with certain movements.  Said sometimes his legs feel weak and experienced occasional falls.  Noted unintentional 40 pound weight loss over the past year. Orthostatics revealed SBP dropped by 10 points. Recommended to stop hydrochlorothiazide and was given Rx for lisinopril 20 mg.    02/21/2022 - I first saw him for 3 month follow up.  Noted lightheaded since carotid surgery intermittently, denied any vertigo symptoms. Was off lisinopril for a week for hypotension.  His symptoms improved significantly after stopping lisinopril.  Blood pressure averaging 117/68.  Did admit to occasional palpitations, nonsustained, not bothersome per his report. Denied chest pain. 14 day Zio and Echo were ordered. Labs were  arranged.   Admitted with NSTEMI at [facility] on 02/23/2022. EKG showed mild ST with minimal ST depression in lateral leads < 1 mm. Serial trops 28 -> 150 (peak) in setting of demand ischemia due to anemia and heme positive stool. Received blood transfusions. Plavix held d/t hx of anemia/ GI bleed. Echo updated in hospital and revealed normal LV function. GI workup was favored before proceeding with invasive cardiac testing. ASA was continued and was instructed not to restart Plavix. Pt left AMA on 02/25/2022. Was told to follow up with GI and cardiology. Underwent upper endoscopy and colonoscopy on 03/06/2022. Upper endoscopy showed hiatal hernia, but was otherwise normal.  Colonoscopy revealed 2 polyps, 5 and 7 mm in size that were removed, otherwise was unremarkable.   03/11/2022 - Saw him for follow-up. GI doctor was arranging capsule endoscopy. Was feeling better after receiving the blood transfusion. Plavix was held. Was smoking 2 PPD, very motivated to quick smoking. Cardiac monitor revealed SR, PAC's < 1% burden. Brief run of SVT, not sustained. Rare PVC's < 1% total beats, had 4 beat run of NSVT vs aberrant SVT, no sustained pauses or arrhthymias. 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 Few non-bleeding erosions noted along ileocecal valve without masses, no active bleeding noted. Was contacted by GI PA-C that he was okay to resume ASA and Plavix.   04/11/2022 - Seen for follow-up. Was doing well and reported feeling a little better since last visit. Denied any chest pain, shortness of breath, palpitations, syncope, presyncope, dizziness, orthopnea, PND, swelling or significant weight changes,  acute bleeding, or claudication. Wanted to increase activity. Still continued to smoke.    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 Presented to [facility] ED for evaluation of sudden onset of right sided weakness and aphasia.  Was intubated for severe agitation to receive TNK and was  transferred to [facility] for further treatment.  MRI negative for new stroke, evidence of chronic stroke and superior parietal lobe that could be source for seizures.  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  Started on Depakote, was told to follow-up with neurology.  Given his stroke concerns, patient would continue DAPT therapy for 3 weeks, then aspirin 81 mg daily for life.  11/12/2022 - Today he presents for scheduled follow-up. Doing better from when I last saw him in office. Chief concern is his elevated BP. Says he hasn't had readings this elevated in a long time. Says he can sense that his BP is elevated, says SBP readings at home average 150s -160's. Denies any chest pain, shortness of breath, palpitations, syncope, presyncope, orthopnea, PND, swelling or significant weight changes, acute bleeding, or claudication. Continues to note dizziness with certain head movements, denies vertigo symptoms.   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 Loves to do yard work.   Please see the history of present illness.    All other systems reviewed and are negative.  EKGs/Labs/Other Studies Reviewed:    The following studies were reviewed today:   EKG:  EKG is not ordered today.   Carotid duplex 10/2022:  Summary:  Right Carotid: Velocities in the right ICA are consistent with a 1-39%  stenosis. Non-hemodynamically significant plaque <50% noted in the CCA.   Left Carotid: Non-hemodynamically significant plaque <50% noted in the CCA. The ECA appears >50% stenosed. Patent left ICA stent. Focal, calcific plaque at the mid segments with 50-75% stenosis, lower end of range.   Vertebrals:  Bilateral vertebral arteries demonstrate antegrade flow.  Subclavians: Normal flow hemodynamics were seen in bilateral subclavian arteries.  Echo 10/2022: 1. Left ventricular  ejection fraction, by estimation, is 55 to 60%. The  left ventricle has normal function. The left ventricle has no regional  wall motion abnormalities. There is moderate concentric left ventricular hypertrophy. Left ventricular diastolic parameters are consistent with Grade I diastolic dysfunction (impaired relaxation).   2. Right ventricular systolic function is normal. The right ventricular  size is normal.   3. The mitral valve is normal in structure. No evidence of mitral valve regurgitation. No evidence of mitral stenosis.   4. The aortic valve was not well visualized. Aortic valve regurgitation  is trivial. No aortic stenosis is present.   5. The inferior vena cava is normal in size with greater than 50%  respiratory variability, suggesting right atrial pressure of 3 mmHg.   6. Agitated saline contrast bubble study was negative, with no evidence of any interatrial shunt.   Comparison(s): No significant change from prior study. Prior images  reviewed side by side.  Cardiac monitor on 03/14/2022: Predominant rhythm is sinus with heart rate ranging from 67 bpm up to 161 bpm and average heart rate 90 bpm. There were rare PACs including atrial couplets representing less than 1% total beats.  There was a single brief run of SVT lasting 5 beats. There were rare PVCs representing less than 1% total beats.  Four beat run of NSVT versus aberrant SVT  was noted. No sustained arrhythmias or pauses.  Echocardiogram on 02/24/2022: 1. Left ventricular ejection fraction, by estimation, is 55 to 60%. The  left ventricle has normal function. The left ventricle has no regional  wall motion abnormalities. Left ventricular diastolic parameters are  indeterminate.   2. Right ventricular systolic function is normal. The right ventricular  size is normal. Tricuspid regurgitation signal is inadequate for assessing  PA pressure.   3. The mitral valve is grossly normal. Trivial mitral valve  regurgitation. No  evidence of mitral stenosis.   4. The aortic valve is tricuspid. Aortic valve regurgitation is mild. No  aortic stenosis is present.   5. The inferior vena cava is normal in size with greater than 50%  respiratory variability, suggesting right atrial pressure of 3 mmHg.   Conclusion(s)/Recommendation(s): Normal biventricular function without  evidence of hemodynamically significant valvular heart disease.  Cardiac monitor - results pending   Bilateral carotid duplex on 11/14/2021: Summary:  Right Carotid: Velocities in the right ICA are consistent with a 1-39%  stenosis.   Left Carotid: Patent left ICA stent with no evidence of restenosis.   Vertebrals:  Bilateral vertebral arteries demonstrate antegrade flow.  Subclavians: Normal flow hemodynamics were seen in bilateral subclavian               arteries.   Mesenteric limited on 08/21/2020: Summary:  Mesenteric:  70 to 99% stenosis in the superior mesenteric artery.  Unable to determine distal end of stent. Stenosis is either at the distal  end of  or distal to the stent.     Vascular US aorta/ IVC/ iliacs doppler on 09/28/2019: Summary:  Abdominal Aorta: The largest aortic measurement is 2.4 cm. Unable to  identify dissection of infrarenal aorta due to bowel gas.  The left common iliac artery measures approximately 2.25 cm x 2.33 cm,  based on limited visualization due to overlying bowel gas.  2D echocardiogram on 12/11/2018: 1. Left ventricular ejection fraction, by visual estimation, is 60 to  65%. The left ventricle has normal function. There is moderately increased  left ventricular hypertrophy.   2. Left ventricular diastolic Doppler parameters are consistent with  impaired relaxation pattern of LV diastolic filling.   3. Global right ventricle has low normal systolic function.The right  ventricular size is normal. No increase in right ventricular wall  thickness.   4. Left atrial size was normal.   5. Right atrial  size was normal.   6. Mild mitral annular calcification.   7. Mild to moderate aortic valve annular calcification.   8. The mitral valve is degenerative. No evidence of mitral valve  regurgitation.   9. The tricuspid valve is grossly normal. Tricuspid valve regurgitation  was not visualized by color flow Doppler.  10. The aortic valve is tricuspid Aortic valve regurgitation was not  visualized by color flow Doppler. Mild aortic valve sclerosis without  stenosis.  11. The pulmonic valve was not well visualized. Pulmonic valve  regurgitation is not visualized by color flow Doppler.  12. The inferior vena cava is normal in size with greater than 50%  respiratory variability, suggesting right atrial pressure of 3 mmHg.   Myoview on 04/23/2013: IMPRESSION:  1. Negative Lexi scan myocardial perfusion imaging stress test   2. Normal left ventricular ejection fraction with normal wall  motion.   3. Low risk study for major cardiovascular events.   Physical Exam:    VS:  BP (!) 161/95 (BP Location: Right Arm, Patient Position: Sitting,  Cuff Size: Normal)   Pulse 100   Wt 131 lb (59.4 kg)   SpO2 98%   BMI 20.52 kg/m     Wt Readings from Last 3 Encounters:  11/12/22 131 lb (59.4 kg)  11/06/22 130 lb 1.3 oz (59 kg)  10/15/22 148 lb 2.4 oz (67.2 kg)    Vitals:   11/12/22 1608 11/12/22 1613 11/12/22 1630 11/12/22 1635  BP: (!) 180/90 (!) 160/100 (!) 160/115 (!) 161/95     GEN: Thin, 67 y.o. male in no acute distress HEENT: Normal NECK: No JVD; No carotid bruits on exam CARDIAC: S1/S2, RRR, Grade 2/6 murmur noted on exam, no rubs, gallops; 2+ pulses RESPIRATORY:  Clear and diminished to auscultation without rales, wheezing or rhonchi  MUSCULOSKELETAL:  No edema; No deformity  SKIN: Warm and dry NEUROLOGIC:  Alert and oriented x 3 PSYCHIATRIC:  Normal affect   ASSESSMENT:    No diagnosis found.   PLAN:    In order of problems listed above:  HTN BP elevated and not at  goal. SBP < 140. SBP averaging 160's at home. HR today 100. Not in acute distress on exam. Denies any concerning red flag signs/symptoms. Will increase Lopressor to 50 mg BID. Continue lisinopril. Patient will send a MyChart message with his readings in 24 hours to see if there is any improvement. If no improvement in SBP, plan to increase Lisinopril to 40 mg daily and repeat BMET in 1 week. Discussed to monitor BP at home at least 2 hours after medications and sitting for 5-10 minutes. Care and ED precautions discussed. Heart healthy encouraged.    CAD, s/p NSTEMI  Admitted 02/2022 with NSTEMI in setting of anemia d/t GI bleed with positive hemoccult (demand ischemia). It was decided that could not undergo cardiac cath until anemia was resolved. Capsule endoscopy results showed no evidence of active bleeding. Upper endoscopy and lower endoscopy were overall unremarkable and did not show source of bleeding. Stable with no anginal symptoms. No indication for ischemic evaluation at this time, working on BP control - see above. Increasing Metoprolol Tartrate to 50 mg BID - continue rest of medication regimen. Heart healthy diet encouraged. ED precautions discussed. Continue to follow with PCP. If his chest pain were to recur, would have low threshold for considering pursuing cardiac catheterization.   PAD He is s/p right CEA in 2021, stenting to SMA in 2022,as well as TCAR (L) in 10/2021. Continue current medication regimen. Follow-up with [facility]. Heart healthy diet encouraged.   HLD, hypertriglyceridemia Labs from 10/2022 revealed total cholesterol 85, HDL 35, LDL 8, and triglycerides 212.  Continue current medication regimen. For his chronically elevated TG, plan to discuss increasing Fenofibrate to 160 mg daily at next OV. Heart healthy diet encouraged.   5. Hx of TIA's/CVA, hx of seizures/dizziness Hx of multiple TIA's, recent MRI revealed no acute intracranial abnormality, chronic small vessel ischemia,  hemosiderin disposition at the superior left parietal lobe, multifocal hyperintense T2-weighted signal within the white matter.  Chronic stroke evident along superior parietal lobe, felt to be the source of his seizures.  Completed Plavix therapy per his report. Continue Aspirin as instructed as d/c. Dizziness remains the same, only noticed with head movement. Care and ED precautions discussed. Continue to follow with PCP and Neurology.   7. Tobacco abuse Still smoking. Smoking cessation encouraged and discussed.   8. Disposition: Follow-up with [facility] in 6 weeks or sooner if anything changes. I will also call him in 1 week for  BP check and for possible adjustment of medications if needed.   Medication Adjustments/Labs and Tests Ordered: Current medicines are reviewed at length with the patient today.  Concerns regarding medicines are outlined above.  No orders of the defined types were placed in this encounter.  Meds ordered this encounter  Medications   metoprolol tartrate (LOPRESSOR) 50 MG tablet    Sig: Take 1 tablet (50 mg total) by mouth 2 (two) times daily.    Dispense:  180 tablet    Refill:  1    Dose increased 04/11/2022    Patient Instructions  Medication Instructions:  Your physician has recommended you make the following change in your medication:  Please increase your Metoprolol to 50 Mg Twice a day  Continue all other medications as prescribed   Labwork: None  Testing/Procedures: None  Follow-Up: Your physician recommends that you schedule a follow-up appointment in: 6 weeks   Any Other Special Instructions Will Be Listed Below (If Applicable).   If you need a refill on your cardiac medications before your next appointment, please call your pharmacy.   Signed, Sharlene Dory, NP  11/12/2022 8:05 PM    [facility]

## 2022-11-12 NOTE — Progress Notes (Unsigned)
[facility] [address] [place]. [place] [address] [phone]       HOSPITAL FOLLOW UP NOTE  Mr. Jimmy Duran Date of Birth:  07-01-1955 Medical Record Number:  [id number]   Reason for Referral:  hospital stroke follow up    SUBJECTIVE:   CHIEF COMPLAINT:  No chief complaint on file.   HPI:   Jimmy Duran is an 67 y.o. male with a past medical history of multiple TIAs, bilateral carotid revascularizations due to carotid artery stenosis bilaterally, AAA, tobacco abuse, COPD, peripheral vascular disease and DM2 who initially presented to the [facility] emergency department on 10/12/2022 for evaluation of sudden onset of right-sided weakness and aphasia. NIHSS 14. Required intubation for severe agitation to receive TNK and transferred to [facility].  Once extubated, no stroke related symptoms.  MRI negative for new stroke although evidence of chronic prior strokes. Suspected presenting symptoms likely in setting of new diagnosis of focal seizures with Todd's paralysis given personality changes and confusion followed by focal left-sided weakness and initial NIHSS score out of proportion to MRI brain findings, prior strokes like source of seizures.  EEG showed cortical dysfunction arising from left hemisphere likely secondary to underlying structural abnormality and mild diffuse encephalopathy, no seizures or epileptiform discharges seen.  Started on Depakote XR 500 mg daily for seizure prophylaxis.  Also recommended DAPT for 3 weeks and aspirin alone for secondary stroke prevention measures and resumed home statins.  A1c 8.6 -advised close follow-up with PCP.          PERTINENT IMAGING  CTA head & neck: Left CC IC arteries with patent stent and 75% stenosis at level of bifurcation with significant eccentric plaque. Left IC distal to stent is patent. 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 Calcified plaque in the intracranial ICAs resulting in  severe stenosis of the right cavernous segment and left supraclinoid segment. MRI  no acute intracranial abnormality  Carotid Doppler (bilateral)  R ICA 1-39%. R CCA <50%. L CCA <50%. Patent L ICA stent but plaque at mid segments w/ 50-75% stenosis.  2D Echo 50-60%. No evident of PFO. No comment on source of embolism.  LDL 8 HgbA1c 8.9    ROS:   14 system review of systems performed and negative with exception of ***  PMH:  Past Medical History:  Diagnosis Date   AAA (abdominal aortic aneurysm) (HCC)    Arthritis    Carotid artery disease (HCC)    Cataract    Colon polyp    COPD (chronic obstructive pulmonary disease) (HCC)    Coronary artery disease    Nonobstructive   Essential hypertension    Focal seizure (HCC) 10/15/2022   GERD (gastroesophageal reflux disease)    Hemorrhoids    History of TIA (transient ischemic attack)    Low back pain    Lumbar radiculopathy    Mixed hyperlipidemia    Palpitations    Peripheral vascular disease (HCC)    Shingles 09/23/2019   Stroke (HCC)    Tobacco dependence    Type 2 diabetes mellitus (HCC)     PSH:  Past Surgical History:  Procedure Laterality Date   APPENDECTOMY  1970   BIOPSY  12/08/2017   Procedure: BIOPSY;  Surgeon: Corbin Ade, MD;  [facility];  Service: Endoscopy;;  esophagus    BIOPSY  12/28/2020   Procedure: BIOPSY;  Surgeon: Corbin Ade, MD;  [facility];  Service: Endoscopy;;   BIOPSY  07/12/2021   Procedure: BIOPSY;  Surgeon: Corbin Ade, MD;  [facility];  Service: Endoscopy;;   BREAST SURGERY Right    Cyst resection on the right   CARDIAC CATHETERIZATION  1990's   in [place]. no stent placement   CATARACT EXTRACTION W/PHACO Right 06/18/2016   Procedure: CATARACT EXTRACTION PHACO AND INTRAOCULAR LENS PLACEMENT (IOC);  Surgeon: Galen Manila, MD;  Location: [facility];  Service: Ophthalmology;  Laterality: Right;  [name] 35.7 AP% 16.6 CDE 5.94 Fluid pack  lot # [id number]   CATARACT EXTRACTION W/PHACO Left 07/16/2016   Procedure: CATARACT EXTRACTION PHACO AND INTRAOCULAR LENS PLACEMENT (IOC);  Surgeon: Galen Manila, MD;  Location: [facility];  Service: Ophthalmology;  Laterality: Left;  [name] 51.8 AP% 12.7 CDE 6.58 Fluid Pack Lot # [id number]   CHOLECYSTECTOMY  1993   COLONOSCOPY  02/12/2007   AOZ:[id number] friable anal canal hemorrhoids, otherwise normal rectum and colon   COLONOSCOPY N/A 07/16/2012   RMR: Colonic diverticulosis. 4 mm tubular adenoma   COLONOSCOPY WITH PROPOFOL N/A 12/08/2017   Dr. Jena Gauss: sigmoid and descending colon diverticulosis, transverse colon polyp (TUBULAR ADENOMA)   COLONOSCOPY WITH PROPOFOL N/A 03/06/2022   Procedure: COLONOSCOPY WITH PROPOFOL;  Surgeon: Corbin Ade, MD;  [facility];  Service: Endoscopy;  Laterality: N/A;  12:30 PM   ENDARTERECTOMY Right 10/08/2019   Procedure: RIGHT CAROTID ENDARTERECTOMY;  Surgeon: Larina Earthly, MD;  Location: [facility];  Service: Vascular;  Laterality: Right;   ESOPHAGOGASTRODUODENOSCOPY (EGD) WITH ESOPHAGEAL DILATION N/A 07/16/2012   RMR: +Candida esophagitis, Erosive reflux esophagiits. Schatizi's ring status post dilation. Hiatal hernia. Antral erosions status post biopsy.    ESOPHAGOGASTRODUODENOSCOPY (EGD) WITH PROPOFOL N/A 12/08/2017   Dr. Jena Gauss: esophagitis, query EOE but negative for increased eosinophils on path, small hiatal hernia, normal stomach, normal duodenum, PAS stain with rare yeast forms on stain. Empiric dilation   ESOPHAGOGASTRODUODENOSCOPY (EGD) WITH PROPOFOL N/A 12/28/2020   esophagitis without bleeding, s/p biopsy and dilation. Antral erosions s/p biopsy.   ESOPHAGOGASTRODUODENOSCOPY (EGD) WITH PROPOFOL N/A 07/12/2021   Surgeon: Corbin Ade, MD;  Cobblestoning appearance of distal esophagus, slight narrowing of distal esophagus s/p dilation with 56 Fr and biopsy, gastric erosions biopsied, normal examined duodenum.  Esophageal biopsy with  reactive squamous mucosa, gastric biopsy with reactive gastropathy, negative for H. pylori.   ESOPHAGOGASTRODUODENOSCOPY (EGD) WITH PROPOFOL N/A 03/06/2022   Procedure: ESOPHAGOGASTRODUODENOSCOPY (EGD) WITH PROPOFOL;  Surgeon: Corbin Ade, MD;  [facility];  Service: Endoscopy;  Laterality: N/A;   EYE SURGERY Bilateral 2018   cataract   GIVENS CAPSULE STUDY N/A 03/13/2022   Procedure: GIVENS CAPSULE STUDY;  Surgeon: Corbin Ade, MD;  [facility];  Service: Endoscopy;  Laterality: N/A;  7:30am   HERNIA REPAIR     JOINT REPLACEMENT     KNEE SURGERY     Left knee arthroscopy torn medial meniscus grade 4 chondral changes medial femoral condyle tibial plateau   MALONEY DILATION N/A 12/08/2017   Procedure: MALONEY DILATION;  Surgeon: Corbin Ade, MD;  [facility];  Service: Endoscopy;  Laterality: N/A;   MALONEY DILATION N/A 12/28/2020   Procedure: [name] DILATION;  Surgeon: Corbin Ade, MD;  [facility];  Service: Endoscopy;  Laterality: N/A;   MALONEY DILATION N/A 07/12/2021   Procedure: [name] DILATION;  Surgeon: Corbin Ade, MD;  [facility];  Service: Endoscopy;  Laterality: N/A;   PATCH ANGIOPLASTY Right 10/08/2019  Procedure: PATCH ANGIOPLASTY of the right common carotid artery using hemashield plaltinum finesse patch;  Surgeon: Larina Earthly, MD;  [facility];  Service: Vascular;  Laterality: Right;   PERIPHERAL VASCULAR INTERVENTION  07/17/2020   Procedure: PERIPHERAL VASCULAR INTERVENTION;  Surgeon: Maeola Harman, MD;  Location: [facility] INVASIVE CV LAB;  Service: Cardiovascular;;   POLYPECTOMY  12/08/2017   Procedure: POLYPECTOMY;  Surgeon: Corbin Ade, MD;  [facility];  Service: Endoscopy;;  colon   POLYPECTOMY  03/06/2022   Procedure: POLYPECTOMY;  Surgeon: Corbin Ade, MD;  [facility];  Service: Endoscopy;;   TOTAL KNEE ARTHROPLASTY Left 04/27/2012    Procedure: TOTAL KNEE ARTHROPLASTY;  Surgeon: Vickki Hearing, MD;  Location: [facility];  Service: Orthopedics;  Laterality: Left;   TRANSCAROTID ARTERY REVASCULARIZATION  Left 10/12/2021   Procedure: Left Transcarotid Artery Revascularization;  Surgeon: Maeola Harman, MD;  Location: [facility] OR;  Service: Vascular;  Laterality: Left;   UMBILICAL HERNIA REPAIR  1993   VISCERAL ANGIOGRAPHY N/A 07/17/2020   Procedure: mesenteric ANGIOGRAPHY;  Surgeon: Maeola Harman, MD;  Location: [facility] INVASIVE CV LAB;  Service: Cardiovascular;  Laterality: N/A;    Social History:  Social History   Socioeconomic History   Marital status: Legally Separated    Spouse name: Not on file   Number of children: 5   Years of education: Not on file   Highest education level: GED or equivalent  Occupational History   Occupation: Disabled    Employer: [employer]  Tobacco Use   Smoking status: Every Day    Current packs/day: 0.50    Average packs/day: 0.5 packs/day for 30.0 years (15.0 ttl pk-yrs)    Types: Cigarettes   Smokeless tobacco: Former    Types: Chew    Quit date: 03/18/2018   Tobacco comments:    0.5 pk per day currently   Vaping Use   Vaping status: Former  Substance and Sexual Activity   Alcohol use: Not Currently    Comment: socially "beer and tomato juice"    Drug use: No   Sexual activity: Yes    Birth control/protection: None  Other Topics Concern   Not on file  Social History Narrative   Lives alone.    Social Determinants of Health   Financial Resource Strain: Low Risk  (06/18/2022)   Overall Financial Resource Strain (CARDIA)    Difficulty of Paying Living Expenses: Not hard at all  Food Insecurity: No Food Insecurity (10/21/2022)   Hunger Vital Sign    Worried About Running Out of Food in the Last Year: Never true    Ran Out of Food in the Last Year: Never true  Transportation Needs: No Transportation Needs (10/21/2022)   PRAPARE - Therapist, art (Medical): No    Lack of Transportation (Non-Medical): No  Physical Activity: Insufficiently Active (06/18/2022)   Exercise Vital Sign    Days of Exercise per Week: 4 days    Minutes of Exercise per Session: 30 min  Stress: No Stress Concern Present (06/18/2022)   [employer] of Occupational Health - Occupational Stress Questionnaire    Feeling of Stress : Not at all  Recent Concern: Stress - Stress Concern Present (03/29/2022)   [employer] of Occupational Health - Occupational Stress Questionnaire    Feeling of Stress : To some extent  Social Connections: Unknown (06/18/2022)   Social Connection and Isolation Panel [NHANES]    Frequency of Communication  with Friends and Family: Patient declined    Frequency of Social Gatherings with Friends and Family: Patient declined    Attends Religious Services: Patient declined    Database administrator or Organizations: No    Attends Engineer, structural: Never    Marital Status: Separated  Recent Concern: Social Connections - Socially Isolated (03/29/2022)   Social Connection and Isolation Panel [NHANES]    Frequency of Communication with Friends and Family: Never    Frequency of Social Gatherings with Friends and Family: Once a week    Attends Religious Services: Never    Database administrator or Organizations: No    Attends Banker Meetings: Never    Marital Status: Separated  Intimate Partner Violence: Not At Risk (03/29/2022)   Humiliation, Afraid, Rape, and Kick questionnaire    Fear of Current or Ex-Partner: No    Emotionally Abused: No    Physically Abused: No    Sexually Abused: No    Family History:  Family History  Problem Relation Age of Onset   Dementia Mother    Hypertension Mother    Colon polyps Mother    Osteoporosis Mother    Cancer Father    Hypertension Father    Coronary artery disease Father        CABG in his 54's   Heart attack Father    Cancer - Lung  Father    Heart disease Brother        has a pacemaker   Emphysema Maternal Grandmother    Cancer Maternal Grandmother    Cancer - Lung Maternal Grandfather    Diabetes Maternal Grandfather    Cancer Paternal Grandmother    Heart attack Paternal Grandfather    Colon cancer Other        maternal great aunt   Colon cancer Other        paternal great aunt    Medications:   Current Outpatient Medications on File Prior to Visit  Medication Sig Dispense Refill   albuterol (PROVENTIL) (2.5 MG/3ML) 0.083% nebulizer solution Take 3 mLs (2.5 mg total) by nebulization every 6 (six) hours as needed for wheezing or shortness of breath. 150 mL 1   albuterol (VENTOLIN HFA) 108 (90 Base) MCG/ACT inhaler Inhale 2 puffs into the lungs every 6 (six) hours as needed for wheezing or shortness of breath. 8 g 2   aspirin EC 81 MG tablet Take 1 tablet (81 mg total) by mouth daily at 6 (six) AM. Swallow whole. 30 tablet 12   clopidogrel (PLAVIX) 75 MG tablet Take 1 tablet (75 mg total) by mouth daily. 20 tablet 0   Continuous Blood Gluc Sensor (FREESTYLE LIBRE 3 SENSOR) MISC 1 each by Does not apply route every 14 (fourteen) days. Place 1 sensor on the skin every 14 days. Use to check glucose continuously 2 each 2   dicyclomine (BENTYL) 20 MG tablet TAKE 1 TABLET BY MOUTH 4 TIMES DAILY AS NEEDED FOR SPASMS 30 tablet 0   diphenhydrAMINE (BENADRYL) 25 MG tablet Take 25 mg by mouth daily as needed for allergies.      divalproex (DEPAKOTE ER) 500 MG 24 hr tablet Take 1 tablet (500 mg total) by mouth daily. 30 tablet 1   fenofibrate (TRICOR) 145 MG tablet Take 1 tablet (145 mg total) by mouth daily. 90 tablet 3   Glucagon (GVOKE HYPOPEN 2-PACK) 1 MG/0.2ML SOAJ Inject 1 mg into the skin as needed (hypoglycemia). 0.2 mL 1  insulin glargine (LANTUS) 100 UNIT/ML injection Inject 0.24 mLs (24 Units total) into the skin daily. 10 mL PRN   lansoprazole (PREVACID) 30 MG capsule TAKE 1 CAPSULE BY MOUTH TWICE DAILY BEFORE A  MEAL 60 capsule 11   lisinopril (ZESTRIL) 20 MG tablet Take 1 tablet (20 mg total) by mouth daily. 90 tablet 3   metFORMIN (GLUCOPHAGE) 1000 MG tablet TAKE 1 TABLET BY MOUTH TWICE DAILY WITH A MEAL 60 tablet 0   metoprolol tartrate (LOPRESSOR) 25 MG tablet Take 1 tablet (25 mg total) by mouth 2 (two) times daily. 60 tablet 6   nitroGLYCERIN (NITROSTAT) 0.4 MG SL tablet Place 1 tablet (0.4 mg total) under the tongue every 5 (five) minutes as needed for chest pain (Do not excieed more than 3 tablets in 15 minutes.). 25 tablet 3   NOVOLOG 100 UNIT/ML injection Inject 14 Units into the skin 3 (three) times daily with meals. 10 mL 11   rosuvastatin (CRESTOR) 40 MG tablet Take 1 tablet by mouth once daily 90 tablet 0   Semaglutide,0.25 or 0.5MG /DOS, (OZEMPIC, 0.25 OR 0.5 MG/DOSE,) 2 MG/3ML SOPN Inject 0.25 mg into the skin once a week. 3 mL 0   No current facility-administered medications on file prior to visit.    Allergies:  No Known Allergies    OBJECTIVE:  Physical Exam  There were no vitals filed for this visit. There is no height or weight on file to calculate BMI. No results found.     11/06/2022    2:59 PM  Depression screen PHQ 2/9  Decreased Interest 0  Down, Depressed, Hopeless 0  PHQ - 2 Score 0  Altered sleeping 0  Tired, decreased energy 0  Change in appetite 0  Feeling bad or failure about yourself  0  Trouble concentrating 0  Moving slowly or fidgety/restless 0  Suicidal thoughts 0  PHQ-9 Score 0  Difficult doing work/chores Not difficult at all     General: well developed, well nourished, seated, in no evident distress Head: head normocephalic and atraumatic.   Neck: supple with no carotid or supraclavicular bruits Cardiovascular: regular rate and rhythm, no murmurs Musculoskeletal: no deformity Skin:  no rash/petichiae Vascular:  Normal pulses all extremities   Neurologic Exam Mental Status: Awake and fully alert. Oriented to place and time. Recent and  remote memory intact. Attention span, concentration and fund of knowledge appropriate. Mood and affect appropriate.  Cranial Nerves: Fundoscopic exam reveals sharp disc margins. Pupils equal, briskly reactive to light. Extraocular movements full without nystagmus. Visual fields full to confrontation. Hearing intact. Facial sensation intact. Face, tongue, palate moves normally and symmetrically.  Motor: Normal bulk and tone. Normal strength in all tested extremity muscles Sensory.: intact to touch , pinprick , position and vibratory sensation.  Coordination: Rapid alternating movements normal in all extremities. Finger-to-nose and heel-to-shin performed accurately bilaterally. Gait and Station: Arises from chair without difficulty. Stance is normal. Gait demonstrates normal stride length and balance with ***. Tandem walk and heel toe ***.  Reflexes: 1+ and symmetric. Toes downgoing.     NIHSS  *** Modified Rankin  ***      ASSESSMENT: MORTEN LETO is a 68 y.o. year old male with new focal seizure with Todd's paralysis on 10/12/2022 likely from prior strokes. Vascular risk factors include history of prior strokes/TIA, bilateral carotid revascularizations, history of tobacco use, HTN, HLD, DM and CAD.      PLAN:  *** :  Residual deficit: ***.  Continue {anticoagluation:28091} and {  statin:28096} for secondary stroke prevention.   Discussed secondary stroke prevention measures and importance of close PCP follow up for aggressive stroke risk factor management including BP goal<130/90, HLD with LDL goal<70 and DM with A1c.<7 .  Stroke labs ***: LDL ***, A1c *** I have gone over the pathophysiology of stroke, warning signs and symptoms, risk factors and their management in some detail with instructions to go to the closest emergency room for symptoms of concern.     Follow up in *** or call earlier if needed   CC:  [facility] provider: Dr. Pearlean Brownie PCP: [name], MD    I spent ***  minutes of face-to-face and non-face-to-face time with patient.  This included previsit chart review including review of recent hospitalization, lab review, study review, order entry, electronic health record documentation, patient education regarding recent stroke including etiology, secondary stroke prevention measures and importance of managing stroke risk factors, residual deficits and typical recovery time and answered all other questions to patient satisfaction   Jimmy Duran, AGNP-BC  [facility] [address] [place], [place] [address]  Phone [phone] Fax [phone] Note: This document was prepared with digital dictation and possible smart phrase technology. Any transcriptional errors that result from this process are unintentional.

## 2022-11-12 NOTE — Patient Instructions (Addendum)
Medication Instructions:  Your physician has recommended you make the following change in your medication:  Please increase your Metoprolol to 50 Mg Twice a day  Continue all other medications as prescribed   Labwork: None  Testing/Procedures: None  Follow-Up: Your physician recommends that you schedule a follow-up appointment in: 6 weeks   Any Other Special Instructions Will Be Listed Below (If Applicable).   If you need a refill on your cardiac medications before your next appointment, please call your pharmacy.

## 2022-11-13 ENCOUNTER — Ambulatory Visit (INDEPENDENT_AMBULATORY_CARE_PROVIDER_SITE_OTHER): Payer: Medicare PPO | Admitting: Adult Health

## 2022-11-13 ENCOUNTER — Ambulatory Visit (HOSPITAL_COMMUNITY): Payer: Medicare PPO | Admitting: Physical Therapy

## 2022-11-13 ENCOUNTER — Encounter: Payer: Self-pay | Admitting: Adult Health

## 2022-11-13 VITALS — BP 180/98 | HR 80 | Ht 67.0 in | Wt 130.0 lb

## 2022-11-13 DIAGNOSIS — G8384 Todd's paralysis (postepileptic): Secondary | ICD-10-CM

## 2022-11-13 DIAGNOSIS — I69398 Other sequelae of cerebral infarction: Secondary | ICD-10-CM | POA: Diagnosis not present

## 2022-11-13 DIAGNOSIS — R200 Anesthesia of skin: Secondary | ICD-10-CM | POA: Diagnosis not present

## 2022-11-13 DIAGNOSIS — Z8673 Personal history of transient ischemic attack (TIA), and cerebral infarction without residual deficits: Secondary | ICD-10-CM

## 2022-11-13 DIAGNOSIS — R569 Unspecified convulsions: Secondary | ICD-10-CM | POA: Diagnosis not present

## 2022-11-13 MED ORDER — DIVALPROEX SODIUM ER 500 MG PO TB24: 500.0000 mg | ORAL_TABLET | Freq: Every day | ORAL | 3 refills | Status: DC

## 2022-11-13 NOTE — Patient Instructions (Addendum)
Continue Depakote ER 500mg  nightly for seizure prevention, will request PCP check Depakote levels at next visit (please send results once completed) Per [place] law, no driving for 6 months after seizure activity  Continue working with PT as scheduled  Follow up with vascular surgery as scheduled next month  Continue aspirin 81 mg daily  and Crestor and fenofibrate for secondary stroke prevention  Continue to follow up with PCP regarding blood pressure, cholesterol and diabetes management  Maintain strict control of hypertension with blood pressure goal below 130/90, diabetes with hemoglobin A1c goal below 7.0 % and cholesterol with LDL cholesterol (bad cholesterol) goal below 70 mg/dL.   Signs of a Stroke? Follow the BEFAST method:  Balance Watch for a sudden loss of balance, trouble with coordination or vertigo Eyes Is there a sudden loss of vision in one or both eyes? Or double vision?  Face: Ask the person to smile. Does one side of the face droop or is it numb?  Arms: Ask the person to raise both arms. Does one arm drift downward? Is there weakness or numbness of a leg? Speech: Ask the person to repeat a simple phrase. Does the speech sound slurred/strange? Is the person confused ? Time: If you observe any of these signs, call 911.     Followup in the future with me in 6 months or call earlier if needed      Thank you for coming to see [name] at [facility]. I hope we have been able to provide you high quality care today.  You may receive a patient satisfaction survey over the next few weeks. We would appreciate your feedback and comments so that we may continue to improve ourselves and the health of our patients.

## 2022-11-19 ENCOUNTER — Telehealth: Payer: Self-pay | Admitting: Nurse Practitioner

## 2022-11-19 ENCOUNTER — Encounter (HOSPITAL_COMMUNITY): Payer: Self-pay

## 2022-11-19 ENCOUNTER — Ambulatory Visit (HOSPITAL_COMMUNITY): Payer: Medicare PPO

## 2022-11-19 DIAGNOSIS — E785 Hyperlipidemia, unspecified: Secondary | ICD-10-CM | POA: Diagnosis not present

## 2022-11-19 DIAGNOSIS — I10 Essential (primary) hypertension: Secondary | ICD-10-CM | POA: Diagnosis not present

## 2022-11-19 DIAGNOSIS — E781 Pure hyperglyceridemia: Secondary | ICD-10-CM | POA: Diagnosis not present

## 2022-11-19 DIAGNOSIS — M6281 Muscle weakness (generalized): Secondary | ICD-10-CM | POA: Diagnosis not present

## 2022-11-19 DIAGNOSIS — I1A0 Resistant hypertension: Secondary | ICD-10-CM | POA: Diagnosis not present

## 2022-11-19 DIAGNOSIS — R262 Difficulty in walking, not elsewhere classified: Secondary | ICD-10-CM

## 2022-11-19 DIAGNOSIS — I739 Peripheral vascular disease, unspecified: Secondary | ICD-10-CM | POA: Diagnosis not present

## 2022-11-19 DIAGNOSIS — R296 Repeated falls: Secondary | ICD-10-CM | POA: Diagnosis not present

## 2022-11-19 DIAGNOSIS — I2510 Atherosclerotic heart disease of native coronary artery without angina pectoris: Secondary | ICD-10-CM | POA: Diagnosis not present

## 2022-11-19 NOTE — Progress Notes (Signed)
I agree with the above plan 

## 2022-11-19 NOTE — Telephone Encounter (Signed)
  Patient Consent for Virtual Visit        Jimmy Duran has provided verbal consent on 11/19/2022 for a virtual visit (video or telephone).   CONSENT FOR VIRTUAL VISIT FOR:  Jimmy Duran  By participating in this virtual visit I agree to the following:  I hereby voluntarily request, consent and authorize [facility] and its employed or contracted physicians, physician assistants, nurse practitioners or other licensed health care professionals (the Practitioner), to provide me with telemedicine health care services (the "Services") as deemed necessary by the treating Practitioner. I acknowledge and consent to receive the Services by the Practitioner via telemedicine. I understand that the telemedicine visit will involve communicating with the Practitioner through live audiovisual communication technology and the disclosure of certain medical information by electronic transmission. I acknowledge that I have been given the opportunity to request an in-person assessment or other available alternative prior to the telemedicine visit and am voluntarily participating in the telemedicine visit.  I understand that I have the right to withhold or withdraw my consent to the use of telemedicine in the course of my care at any time, without affecting my right to future care or treatment, and that the Practitioner or I may terminate the telemedicine visit at any time. I understand that I have the right to inspect all information obtained and/or recorded in the course of the telemedicine visit and may receive copies of available information for a reasonable fee.  I understand that some of the potential risks of receiving the Services via telemedicine include:  Delay or interruption in medical evaluation due to technological equipment failure or disruption; Information transmitted may not be sufficient (e.g. poor resolution of images) to allow for appropriate medical decision making by the  Practitioner; and/or  In rare instances, security protocols could fail, causing a breach of personal health information.  Furthermore, I acknowledge that it is my responsibility to provide information about my medical history, conditions and care that is complete and accurate to the best of my ability. I acknowledge that Practitioner's advice, recommendations, and/or decision may be based on factors not within their control, such as incomplete or inaccurate data provided by me or distortions of diagnostic images or specimens that may result from electronic transmissions. I understand that the practice of medicine is not an exact science and that Practitioner makes no warranties or guarantees regarding treatment outcomes. I acknowledge that a copy of this consent can be made available to me via my patient portal [facility] MyChart), or I can request a printed copy by calling the office of  [facility].    I understand that my insurance will be billed for this visit.   I have read or had this consent read to me. I understand the contents of this consent, which adequately explains the benefits and risks of the Services being provided via telemedicine.  I have been provided ample opportunity to ask questions regarding this consent and the Services and have had my questions answered to my satisfaction. I give my informed consent for the services to be provided through the use of telemedicine in my medical care

## 2022-11-19 NOTE — Therapy (Signed)
OUTPATIENT PHYSICAL THERAPY NEURO EVALUATION   Patient Name: Jimmy Duran MRN: [id number] DOB:12-04-1955, 67 y.o., male Today's Date: 11/19/2022   PCP: Christel Mormon REFERRING PROVIDER: Micki Riley, MD  END OF SESSION:  PT End of Session - 11/19/22 1526     Visit Number 2    Number of Visits 12    Date for PT Re-Evaluation 12/23/22    Authorization Type [employer]    Authorization Time Period 12 visits approved from 9/09-->12/17/22    Authorization - Visit Number 1    Authorization - Number of Visits 12    Progress Note Due on Visit 10    PT Start Time 1446    PT Stop Time 1526    PT Time Calculation (min) 40 min    Activity Tolerance Patient tolerated treatment well    Behavior During Therapy [facility] for tasks assessed/performed              Past Medical History:  Diagnosis Date   AAA (abdominal aortic aneurysm) (HCC)    Arthritis    Carotid artery disease (HCC)    Cataract    Colon polyp    COPD (chronic obstructive pulmonary disease) (HCC)    Coronary artery disease    Nonobstructive   Essential hypertension    Focal seizure (HCC) 10/15/2022   GERD (gastroesophageal reflux disease)    Hemorrhoids    History of TIA (transient ischemic attack)    Low back pain    Lumbar radiculopathy    Mixed hyperlipidemia    Palpitations    Peripheral vascular disease (HCC)    Shingles 09/23/2019   Stroke (HCC)    Tobacco dependence    Type 2 diabetes mellitus (HCC)    Past Surgical History:  Procedure Laterality Date   APPENDECTOMY  1970   BIOPSY  12/08/2017   Procedure: BIOPSY;  Surgeon: Corbin Ade, MD;  [facility];  Service: Endoscopy;;  esophagus    BIOPSY  12/28/2020   Procedure: BIOPSY;  Surgeon: Corbin Ade, MD;  [facility];  Service: Endoscopy;;   BIOPSY  07/12/2021   Procedure: BIOPSY;  Surgeon: Corbin Ade, MD;  [facility];  Service: Endoscopy;;   BREAST SURGERY Right    Cyst resection on the right    CARDIAC CATHETERIZATION  1990's   in [place]. no stent placement   CATARACT EXTRACTION W/PHACO Right 06/18/2016   Procedure: CATARACT EXTRACTION PHACO AND INTRAOCULAR LENS PLACEMENT (IOC);  Surgeon: Galen Manila, MD;  Location: [facility] OR[facility];  Service: Ophthalmology;  Laterality: Right;  [name] 35.7 AP% 16.6 CDE 5.94 Fluid pack lot # [id number]   CATARACT EXTRACTION W/PHACO Left 07/16/2016   Procedure: CATARACT EXTRACTION PHACO AND INTRAOCULAR LENS PLACEMENT (IOC);  Surgeon: Galen Manila, MD;  Location: [facility];  Service: Ophthalmology;  Laterality: Left;  [name] 51.8 AP% 12.7 CDE 6.58 Fluid Pack Lot # [id number]   CHOLECYSTECTOMY  1993   COLONOSCOPY  02/12/2007   GMW:[id number] friable anal canal hemorrhoids, otherwise normal rectum and colon   COLONOSCOPY N/A 07/16/2012   RMR: Colonic diverticulosis. 4 mm tubular adenoma   COLONOSCOPY WITH PROPOFOL N/A 12/08/2017   Dr. Jena Gauss: sigmoid and descending colon diverticulosis, transverse colon polyp (TUBULAR ADENOMA)   COLONOSCOPY WITH PROPOFOL N/A 03/06/2022   Procedure: COLONOSCOPY WITH PROPOFOL;  Surgeon: Corbin Ade, MD;  [facility];  Service: Endoscopy;  Laterality: N/A;  12:30 PM   ENDARTERECTOMY Right 10/08/2019   Procedure: RIGHT CAROTID  ENDARTERECTOMY;  Surgeon: Larina Earthly, MD;  Location: [facility] OR;  Service: Vascular;  Laterality: Right;   ESOPHAGOGASTRODUODENOSCOPY (EGD) WITH ESOPHAGEAL DILATION N/A 07/16/2012   RMR: +Candida esophagitis, Erosive reflux esophagiits. Schatizi's ring status post dilation. Hiatal hernia. Antral erosions status post biopsy.    ESOPHAGOGASTRODUODENOSCOPY (EGD) WITH PROPOFOL N/A 12/08/2017   Dr. Jena Gauss: esophagitis, query EOE but negative for increased eosinophils on path, small hiatal hernia, normal stomach, normal duodenum, PAS stain with rare yeast forms on stain. Empiric dilation   ESOPHAGOGASTRODUODENOSCOPY (EGD) WITH PROPOFOL N/A 12/28/2020   esophagitis without bleeding, s/p biopsy  and dilation. Antral erosions s/p biopsy.   ESOPHAGOGASTRODUODENOSCOPY (EGD) WITH PROPOFOL N/A 07/12/2021   Surgeon: Corbin Ade, MD;  Cobblestoning appearance of distal esophagus, slight narrowing of distal esophagus s/p dilation with 56 Fr and biopsy, gastric erosions biopsied, normal examined duodenum.  Esophageal biopsy with reactive squamous mucosa, gastric biopsy with reactive gastropathy, negative for H. pylori.   ESOPHAGOGASTRODUODENOSCOPY (EGD) WITH PROPOFOL N/A 03/06/2022   Procedure: ESOPHAGOGASTRODUODENOSCOPY (EGD) WITH PROPOFOL;  Surgeon: Corbin Ade, MD;  [facility];  Service: Endoscopy;  Laterality: N/A;   EYE SURGERY Bilateral 2018   cataract   GIVENS CAPSULE STUDY N/A 03/13/2022   Procedure: GIVENS CAPSULE STUDY;  Surgeon: Corbin Ade, MD;  [facility];  Service: Endoscopy;  Laterality: N/A;  7:30am   HERNIA REPAIR     JOINT REPLACEMENT     KNEE SURGERY     Left knee arthroscopy torn medial meniscus grade 4 chondral changes medial femoral condyle tibial plateau   MALONEY DILATION N/A 12/08/2017   Procedure: MALONEY DILATION;  Surgeon: Corbin Ade, MD;  [facility];  Service: Endoscopy;  Laterality: N/A;   MALONEY DILATION N/A 12/28/2020   Procedure: [name] DILATION;  Surgeon: Corbin Ade, MD;  [facility];  Service: Endoscopy;  Laterality: N/A;   MALONEY DILATION N/A 07/12/2021   Procedure: [name] DILATION;  Surgeon: Corbin Ade, MD;  [facility];  Service: Endoscopy;  Laterality: N/A;   PATCH ANGIOPLASTY Right 10/08/2019   Procedure: PATCH ANGIOPLASTY of the right common carotid artery using hemashield plaltinum finesse patch;  Surgeon: Larina Earthly, MD;  Location: [facility];  Service: Vascular;  Laterality: Right;   PERIPHERAL VASCULAR INTERVENTION  07/17/2020   Procedure: PERIPHERAL VASCULAR INTERVENTION;  Surgeon: Maeola Harman, MD;  Location: [facility] INVASIVE CV LAB;  Service:  Cardiovascular;;   POLYPECTOMY  12/08/2017   Procedure: POLYPECTOMY;  Surgeon: Corbin Ade, MD;  [facility];  Service: Endoscopy;;  colon   POLYPECTOMY  03/06/2022   Procedure: POLYPECTOMY;  Surgeon: Corbin Ade, MD;  [facility];  Service: Endoscopy;;   TOTAL KNEE ARTHROPLASTY Left 04/27/2012   Procedure: TOTAL KNEE ARTHROPLASTY;  Surgeon: Vickki Hearing, MD;  Location: [facility];  Service: Orthopedics;  Laterality: Left;   TRANSCAROTID ARTERY REVASCULARIZATION  Left 10/12/2021   Procedure: Left Transcarotid Artery Revascularization;  Surgeon: Maeola Harman, MD;  Location: [facility] OR;  Service: Vascular;  Laterality: Left;   UMBILICAL HERNIA REPAIR  1993   VISCERAL ANGIOGRAPHY N/A 07/17/2020   Procedure: mesenteric ANGIOGRAPHY;  Surgeon: Maeola Harman, MD;  Location: [facility] INVASIVE CV LAB;  Service: Cardiovascular;  Laterality: N/A;   Patient Active Problem List   Diagnosis Date Noted   Focal seizure (HCC) 10/15/2022   Protein-calorie malnutrition, severe 10/14/2022   Stroke determined by clinical assessment (HCC) 10/12/2022  Bilateral groin pain 09/17/2022   Community acquired pneumonia 08/20/2022   Right-sided chest pain 08/20/2022   Dysuria 07/25/2022   Abdominal pain 06/19/2022   Occult GI bleeding 03/14/2022   NSTEMI (non-ST elevated myocardial infarction) (HCC) 02/24/2022   Peripheral vascular disease (HCC) 02/24/2022   Acute on chronic blood loss anemia 02/24/2022   Acute blood loss anemia 02/24/2022   Acute otitis media 01/23/2022   Iron deficiency anemia, unspecified 12/25/2021   Need for influenza vaccination 12/14/2021   Unintentional weight loss 12/14/2021   Carotid stenosis, asymptomatic, left 10/12/2021   Asymptomatic carotid artery stenosis without infarction, right 10/08/2019   Abdominal pain, left lower quadrant 06/18/2018   History of colonic polyps 11/05/2017   Diarrhea 08/15/2014   Dizziness 01/04/2014    Transient ischemic attack 01/04/2014   Palpitations 04/22/2013   Esophageal dysphagia 07/02/2012   OA (osteoarthritis) of knee 06/17/2011   Gastroesophageal reflux disease 05/04/2010   DM type 2 causing vascular disease (HCC) 05/04/2010   COPD (chronic obstructive pulmonary disease) (HCC) 05/04/2010   Hemorrhoids 05/04/2010   Mixed hyperlipidemia 04/25/2010   Current smoker 04/25/2010   Essential hypertension, benign 04/25/2010   CORONARY ATHEROSCLEROSIS NATIVE CORONARY ARTERY 04/25/2010   CAROTID ARTERY DISEASE 04/25/2010    ONSET DATE: 10/12/22  REFERRING DIAG: I63.9 (ICD-10-CM) - Acute ischemic stroke (HCC) R56.9 (ICD-10-CM) - Focal seizure (HCC)  THERAPY DIAG:  Muscle weakness (generalized)  Difficulty in walking, not elsewhere classified  Repeated falls  Rationale for Evaluation and Treatment: Rehabilitation  SUBJECTIVE:                                                                                                                                                                                             SUBJECTIVE STATEMENT: 11/19/22:  Pt stated he feels great following last eval, has began HEP and has been walking a lot.  No reports of pain currently.    Eval:  Pt states that he has been trying to walk since he left the hospital but he is having pain in his Rt leg especially from the knee down pain will go as high as a 10/10.  He has had several mini strokes but never a completed stroke.  He is dragging his Right leg and wants to be able to walk better. 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  Pt accompanied by: self  PERTINENT HISTORY: per MD : Bruna Potter is an 67 y.o. male with a past medical history of multiple TIAs, bilateral carotid revascularizations due to carotid artery stenosis bilaterally, AAA, tobacco abuse, COPD, peripheral vascular disease and DM2 who initially  presented to the [facility] emergency department for evaluation of sudden  onset of right-sided weakness and aphasia. NIHSS 14. He was intubated for severe agitation to receive TNK and prior to transfer to [facility].  In [place] [facility], patient was extubated on 8/12. During intubating, stroke symptoms were not present as evident by limited exam. Following extubation, he had no stroke related symptoms. 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 PAIN:  Are you having pain? No  PRECAUTIONS: Fall    FALLS: Has patient fallen in last 6 months? Yes. Number of falls 3  LIVING ENVIRONMENT: Lives with: lives with their family Lives in: House/apartment Stairs: Yes: External: 1 steps; none Has following equipment at home: Quad cane large base and None; does not like the quad cane.   PLOF: Independent  PATIENT GOALS: get back as normal as I can  OBJECTIVE:   DIAGNOSTIC FINDINGS: see above   COGNITION: Overall cognitive status: Within functional limits for tasks assessed     LOWER EXTREMITY ROM:   WFL LOWER EXTREMITY MMT:    MMT Right Eval Left Eval  Hip flexion 3+   Hip extension 3   Hip abduction 4   Hip adduction    Hip internal rotation    Hip external rotation    Knee flexion 3   Knee extension 3+   Ankle dorsiflexion 3-   Ankle plantarflexion    Ankle inversion    Ankle eversion    (Blank rows = not tested)  BED MOBILITY:  I   TRANSFERS: I FUNCTIONAL TESTS:             Single leg stance:  Rt: 0"   , Lt :  8" 5 times sit to stand: 13.7 30 seconds chair stand test : 11 ,( 9 is poor, 13 below average for age and sex) 2 minute walk test: 248 ft     TODAY'S TREATMENT:                                                                                                                              DATE:  11/19/22:  Reviewed goals  Educated importance of HEP compliance for maximal benefits Sit to stand 10x no HHA Standing: heel raise on incline slope 15x  Toe raises on decline slope 15x  Squat front of chair 10x   SLS Rt 10", Lt 6"  Sidestep with RTB around thigh 3RT with SBA  Abduct with RTB 10x  Vector stance 3x 5"  Tandem stance 2x 30"  Gastroc stretch 2x 30"  Eval:  Seated Ankle Pumps  - 2 x daily - 7 x weekly - 1 sets - 10 reps - 3-5" hold - Sit to Stand  - 2 x daily - 7 x weekly - 1 sets - 10 reps - Seated Long Arc Quad  - 2 x daily - 7 x weekly - 1 sets - 10 reps - 3-5 hold             -bridge x 10    11/19/22:  - Squat with Chair Touch  - 1 x daily - 7 x weekly - 3 sets - 10 reps - Standing Hip Abduction with Resistance at Ankles and Unilateral Counter Support  - 1 x daily - 7 x weekly - 3 sets - 10 reps - Side Stepping with Resistance at Thighs and Counter Support  - 1 x daily - 7 x weekly - 3 sets - 10 reps - Standing Tandem Balance with Unilateral Counter Support  - 1 x daily - 7 x weekly - 1 sets - 3 reps - 30" hold   PATIENT EDUCATION: Education details: HEP Person educated: Patient Education method: Explanation, Verbal cues, and Handouts Education comprehension: verbalized understanding and returned demonstration  HOME EXERCISE PROGRAM: Access Code: [email or web address] URL: [email or web address] Date: 11/11/2022 Prepared by: Virgina Organ  Exercises - Seated Ankle Pumps  - 2 x daily - 7 x weekly - 1 sets - 10 reps - 3-5" hold - Sit to Stand  - 2 x daily - 7 x weekly - 1 sets - 10 reps - Seated Long Arc Quad  - 2 x daily - 7 x weekly - 1 sets - 10 reps - 3-5 hold             -bridge x 10  GOALS: Goals reviewed with patient? Yes 11/19/22  SHORT TERM GOALS: Target date: 12/02/22  PT I in HEP in  order to decrease his leg pain to no greater than a 6/10 Baseline: Goal status: IN PROGRESS  2.  Pt strength to be increased 1/2 grade to allow to be able to come sit to stand from low couch Baseline:  Goal status: IN PROGRESS  3.  PT to be able to single leg stance on both LE for 10 seconds to decrease risk of falling  Baseline:  Goal status: IN PROGRESS   LONG TERM GOALS: Target date: 12/23/22  PT I in HEP in order to decrease his leg pain to no greater than a 3/10 Baseline:  Goal status: IN PROGRESS  2.  Pt strength to be increased 1/2 grade to allow to be able to go up and down 12 steps in a reciprocal manner  Baseline:  Goal status: IN PROGRESS  3.  PT to be able to single leg stance on both LE for 20 seconds to decrease risk of falling Baseline:  Goal status: IN PROGRESS  4.  PT to be back working with  his wood working  Baseline:  Goal status: IN PROGRESS  L  ASSESSMENT:  CLINICAL IMPRESSION: 11/19/22:  Reviewed goals and educated importance of HEP compliance.  Pt able to recall and demonstrate current exercise program.  Session focus with LE strengthening and balance training.  Pt stated he is doing great and was ambitious with all exercises today.  Most difficulty with balance activities this session, required SBA and intermittent HHA.  Added exercises for strengthening and balance to HEP with printout given.    Eval: Patient is a 67 y.o. male who was  seen today for physical therapy evaluation and treatment for CVA.  Examination demonstrates decreased ROM, decreased strength, decreased activity tolerance and decreased balance.  Mr. Lanzo will benefit from skilled PT to address these issues and maximize his functional ability.   OBJECTIVE IMPAIRMENTS: Abnormal gait, decreased activity tolerance, decreased balance, difficulty walking, decreased ROM, decreased strength, and pain.   ACTIVITY LIMITATIONS: carrying, lifting, standing, and locomotion level  PARTICIPATION  LIMITATIONS: shopping, community activity, and wood working   [facility] POTENTIAL: Good  CLINICAL DECISION MAKING: Stable/uncomplicated  EVALUATION COMPLEXITY: Moderate  PLAN:  PT FREQUENCY: 2x/week  PT DURATION: 6 weeks  PLANNED INTERVENTIONS: Therapeutic exercises, Therapeutic activity, Neuromuscular re-education, Balance training, Gait training, Patient/Family education, and Self Care  PLAN FOR NEXT SESSION: Stretching for RT calf continue with strengthening and balance activity.   Becky Sax, LPTA/CLT; CBIS [phone]  [name], PTA 11/19/2022, 4:37 PM  11/19/2022, 4:37 PM

## 2022-11-20 ENCOUNTER — Encounter: Payer: Self-pay | Admitting: Nurse Practitioner

## 2022-11-20 ENCOUNTER — Ambulatory Visit (INDEPENDENT_AMBULATORY_CARE_PROVIDER_SITE_OTHER): Payer: Medicare PPO | Admitting: Nurse Practitioner

## 2022-11-20 ENCOUNTER — Telehealth: Payer: Self-pay | Admitting: Internal Medicine

## 2022-11-20 ENCOUNTER — Encounter: Payer: Self-pay | Admitting: Family Medicine

## 2022-11-20 ENCOUNTER — Telehealth (INDEPENDENT_AMBULATORY_CARE_PROVIDER_SITE_OTHER): Payer: Medicare PPO | Admitting: Family Medicine

## 2022-11-20 VITALS — BP 161/98 | HR 89 | Wt 130.0 lb

## 2022-11-20 VITALS — Ht 67.0 in | Wt 130.0 lb

## 2022-11-20 DIAGNOSIS — R0602 Shortness of breath: Secondary | ICD-10-CM

## 2022-11-20 DIAGNOSIS — I2510 Atherosclerotic heart disease of native coronary artery without angina pectoris: Secondary | ICD-10-CM | POA: Diagnosis not present

## 2022-11-20 DIAGNOSIS — I10 Essential (primary) hypertension: Secondary | ICD-10-CM | POA: Diagnosis not present

## 2022-11-20 DIAGNOSIS — Z720 Tobacco use: Secondary | ICD-10-CM

## 2022-11-20 DIAGNOSIS — I739 Peripheral vascular disease, unspecified: Secondary | ICD-10-CM

## 2022-11-20 DIAGNOSIS — J40 Bronchitis, not specified as acute or chronic: Secondary | ICD-10-CM

## 2022-11-20 DIAGNOSIS — E781 Pure hyperglyceridemia: Secondary | ICD-10-CM

## 2022-11-20 DIAGNOSIS — R051 Acute cough: Secondary | ICD-10-CM

## 2022-11-20 DIAGNOSIS — E785 Hyperlipidemia, unspecified: Secondary | ICD-10-CM

## 2022-11-20 DIAGNOSIS — Z87898 Personal history of other specified conditions: Secondary | ICD-10-CM

## 2022-11-20 DIAGNOSIS — I1A0 Resistant hypertension: Secondary | ICD-10-CM

## 2022-11-20 DIAGNOSIS — Z8673 Personal history of transient ischemic attack (TIA), and cerebral infarction without residual deficits: Secondary | ICD-10-CM

## 2022-11-20 MED ORDER — AZITHROMYCIN 250 MG PO TABS
ORAL_TABLET | ORAL | 0 refills | Status: AC
Start: 2022-11-20 — End: 2022-11-25

## 2022-11-20 MED ORDER — METHYLPREDNISOLONE 4 MG PO TBPK: ORAL_TABLET | ORAL | 0 refills | Status: DC

## 2022-11-20 MED ORDER — BENZONATATE 100 MG PO CAPS
100.0000 mg | ORAL_CAPSULE | Freq: Two times a day (BID) | ORAL | 0 refills | Status: DC | PRN
Start: 2022-11-20 — End: 2022-12-17

## 2022-11-20 MED ORDER — CARVEDILOL 12.5 MG PO TABS: 12.5000 mg | ORAL_TABLET | Freq: Two times a day (BID) | ORAL | 2 refills | Status: DC

## 2022-11-20 NOTE — Progress Notes (Signed)
Virtual Visit via Video Note  I connected with Jimmy Duran on 11/20/22 at  3:40 PM EDT by a video enabled telemedicine application and verified that I am speaking with the correct person using two identifiers.  Patient Location: Home Provider Location: Office/Clinic  I discussed the limitations, risks, security, and privacy concerns of performing an evaluation and management service by video and the availability of in person appointments. I also discussed with the patient that there may be a patient responsible charge related to this service. The patient expressed understanding and agreed to proceed.  Subjective: PCP: [name], MD  Chief Complaint  Patient presents with   Cough    Pt reports cough, chest congestion, head stopped up, having green mucus. Ongoing for 1 week and a half.   HPI The patient presented today with complaints of a productive cough that worsens at nighttime, along with chest congestion and expectoration of green phlegm. He denies experiencing fever, chills, body aches, headaches, sore throat, difficulty breathing, wheezing, or shortness of breath. Notably, the patient is a smoker and reports no current exposure to COVID-19   ROS: Per HPI  Current Outpatient Medications:    albuterol (PROVENTIL) (2.5 MG/3ML) 0.083% nebulizer solution, Take 3 mLs (2.5 mg total) by nebulization every 6 (six) hours as needed for wheezing or shortness of breath., Disp: 150 mL, Rfl: 1   albuterol (VENTOLIN HFA) 108 (90 Base) MCG/ACT inhaler, Inhale 2 puffs into the lungs every 6 (six) hours as needed for wheezing or shortness of breath., Disp: 8 g, Rfl: 2   aspirin EC 81 MG tablet, Take 1 tablet (81 mg total) by mouth daily at 6 (six) AM. Swallow whole., Disp: 30 tablet, Rfl: 12   azithromycin (ZITHROMAX) 250 MG tablet, Take 2 tablets on day 1, then 1 tablet daily on days 2 through 5, Disp: 6 tablet, Rfl: 0   benzonatate (TESSALON) 100 MG capsule, Take 1 capsule (100 mg  total) by mouth 2 (two) times daily as needed for cough., Disp: 20 capsule, Rfl: 0   carvedilol (COREG) 12.5 MG tablet, Take 1 tablet (12.5 mg total) by mouth 2 (two) times daily with a meal., Disp: 60 tablet, Rfl: 2   Continuous Blood Gluc Sensor (FREESTYLE LIBRE 3 SENSOR) MISC, 1 each by Does not apply route every 14 (fourteen) days. Place 1 sensor on the skin every 14 days. Use to check glucose continuously, Disp: 2 each, Rfl: 2   dicyclomine (BENTYL) 20 MG tablet, TAKE 1 TABLET BY MOUTH 4 TIMES DAILY AS NEEDED FOR SPASMS, Disp: 30 tablet, Rfl: 0   diphenhydrAMINE (BENADRYL) 25 MG tablet, Take 25 mg by mouth daily as needed for allergies. , Disp: , Rfl:    divalproex (DEPAKOTE ER) 500 MG 24 hr tablet, Take 1 tablet (500 mg total) by mouth daily., Disp: 90 tablet, Rfl: 3   fenofibrate (TRICOR) 145 MG tablet, Take 1 tablet (145 mg total) by mouth daily., Disp: 90 tablet, Rfl: 3   Glucagon (GVOKE HYPOPEN 2-PACK) 1 MG/0.2ML SOAJ, Inject 1 mg into the skin as needed (hypoglycemia)., Disp: 0.2 mL, Rfl: 1   insulin glargine (LANTUS) 100 UNIT/ML injection, Inject 0.24 mLs (24 Units total) into the skin daily., Disp: 10 mL, Rfl: PRN   lansoprazole (PREVACID) 30 MG capsule, TAKE 1 CAPSULE BY MOUTH TWICE DAILY BEFORE A MEAL, Disp: 60 capsule, Rfl: 11   lisinopril (ZESTRIL) 20 MG tablet, Take 1 tablet (20 mg total) by mouth daily., Disp: 90 tablet, Rfl: 3  metFORMIN (GLUCOPHAGE) 1000 MG tablet, TAKE 1 TABLET BY MOUTH TWICE DAILY WITH A MEAL, Disp: 60 tablet, Rfl: 0   methylPREDNISolone (MEDROL DOSEPAK) 4 MG TBPK tablet, Take as the package instructed, Disp: 1 each, Rfl: 0   nitroGLYCERIN (NITROSTAT) 0.4 MG SL tablet, Place 1 tablet (0.4 mg total) under the tongue every 5 (five) minutes as needed for chest pain (Do not excieed more than 3 tablets in 15 minutes.)., Disp: 25 tablet, Rfl: 3   NOVOLOG 100 UNIT/ML injection, Inject 14 Units into the skin 3 (three) times daily with meals., Disp: 10 mL, Rfl: 11    rosuvastatin (CRESTOR) 40 MG tablet, Take 1 tablet by mouth once daily, Disp: 90 tablet, Rfl: 0   Semaglutide,0.25 or 0.5MG /DOS, (OZEMPIC, 0.25 OR 0.5 MG/DOSE,) 2 MG/3ML SOPN, Inject 0.25 mg into the skin once a week., Disp: 3 mL, Rfl: 0  Observations/Objective: Today's Vitals   11/20/22 1509  Weight: 130 lb (59 kg)  Height: 5\' 7"  (1.702 m)  PainSc: 0-No pain   Physical Exam Patient is well-developed, well-nourished in no acute distress.  Resting comfortably at home.  Head is normocephalic, atraumatic.  No labored breathing.  Speech is clear and coherent with logical content.  Patient is alert and oriented at baseline.   Assessment and Plan: Bronchitis -     Azithromycin; Take 2 tablets on day 1, then 1 tablet daily on days 2 through 5  Dispense: 6 tablet; Refill: 0 -     methylPREDNISolone; Take as the package instructed  Dispense: 1 each; Refill: 0 -     Benzonatate; Take 1 capsule (100 mg total) by mouth 2 (two) times daily as needed for cough.  Dispense: 20 capsule; Refill: 0  Will provide a Z-Pak for anaerobic coverage Encouraged to take and complete the full course of azithromycin and Medrol Dosepak Tessalon Perles ordered for cough Increase fluids and allow for plenty of rest. Recommend Tylenol  as needed for pain, fever, or general discomfort. Recommend using a humidifier at bedtime during sleep to help with cough and nasal congestion. Smoking cessation encouraged  Follow Up Instructions: Encouraged to follow-up if his symptoms fail to improve or worsens   I discussed the assessment and treatment plan with the patient. The patient was provided an opportunity to ask questions, and all were answered. The patient agreed with the plan and demonstrated an understanding of the instructions.   The patient was advised to call back or seek an in-person evaluation if the symptoms worsen or if the condition fails to improve as anticipated.  The above assessment and management plan  was discussed with the patient. The patient verbalized understanding of and has agreed to the management plan.   Gilmore Laroche, FNP

## 2022-11-20 NOTE — Telephone Encounter (Signed)
error 

## 2022-11-20 NOTE — Patient Instructions (Addendum)
Medication Instructions:  Your physician has recommended you make the following change in your medication:  Stop taking Metoprolol  Start taking Carvedilol 12.5 mg twice daily Continue taking all other medications as prescribed   Labwork: None  Testing/Procedures: Your physician has requested that you have a renal artery duplex. During this test, an ultrasound is used to evaluate blood flow to the kidneys. Allow one hour for this exam. Do not eat after midnight the day before and avoid carbonated beverages. Take your medications as you usually do.   Follow-Up: Your physician recommends that you schedule a follow-up appointment in: 1 week Nurse visit BP check  Any Other Special Instructions Will Be Listed Below (If Applicable).  If you need a refill on your cardiac medications before your next appointment, please call your pharmacy.

## 2022-11-20 NOTE — Progress Notes (Signed)
Virtual Visit via Telephone Note   Because of ULICE CZECH co-morbid illnesses, he is at least at moderate risk for complications without adequate follow up.  This format is felt to be most appropriate for this patient at this time.  The patient did not have access to video technology/had technical difficulties with video requiring transitioning to audio format only (telephone).  All issues noted in this document were discussed and addressed.  No physical exam could be performed with this format.  Please refer to the patient's chart for his consent to telehealth for [facility].    Date:  11/20/2022   ID:  Bruna Potter, DOB 25-Aug-1955, MRN [id number] The patient was identified using 2 identifiers.  Patient Location: Home Provider Location: Home Office   PCP:  [name], MD   [facility] Providers Cardiologist:  [name], MD     Evaluation Performed:  Follow-Up Visit  Chief Complaint:  Elevated Blood Pressure  History of Present Illness:    KEDARIUS VERBA is a 67 y.o. male with a PMH of CAD, carotid stenosis, s/p R CEA (2021) and left TCAR - 8/23 (followed by [facility]), HTN, T2DM, hx of multiple TIA's/CVA, COPD, GERD, palpitations, dizziness, hx of seizure, and IDA, who presents today for scheduled follow-up.    Previous cardiovascular history includes nonobstructive CAD and low risk stress test in 2015  In 2021 he underwent right CEA, and underwent previous stenting to superior mesenteric artery in 2022.   Admitted to [facility] in August 2023 for arranged left TCAR, no immediate complications.  [facility]ed back to [name] on October 27, 2021 for worsening dizziness x 3 weeks.  Received IV fluids due to poor p.o. intake.  Lopressor was reduced and was recommended to follow-up with cardiology outpatient.   Seen by [name], PA-C on November 22, 2021 for hospital follow-up. Pt noted worsening lightheadedness x 1 month, not triggered  with certain movements.  Said sometimes his legs feel weak and experienced occasional falls.  Noted unintentional 40 pound weight loss over the past year. Orthostatics revealed SBP dropped by 10 points. Recommended to stop hydrochlorothiazide and was given Rx for lisinopril 20 mg.     02/21/2022 - I first saw him for 3 month follow up.  Noted lightheaded since carotid surgery intermittently, denied any vertigo symptoms. Was off lisinopril for a week for hypotension.  His symptoms improved significantly after stopping lisinopril.  Blood pressure averaging 117/68.  Did admit to occasional palpitations, nonsustained, not bothersome per his report. Denied chest pain. 14 day Zio and Echo were ordered. Labs were arranged.    Admitted with NSTEMI at [facility] on 02/23/2022. EKG showed mild ST with minimal ST depression in lateral leads < 1 mm. Serial trops 28 -> 150 (peak) in setting of demand ischemia due to anemia and heme positive stool. Received blood transfusions. Plavix held d/t hx of anemia/ GI bleed. TTE revealed normal LV function. GI workup was favored before proceeding with invasive cardiac testing. ASA was continued and was instructed not to restart Plavix. Pt left AMA on 02/25/2022. Was told to follow up with GI and cardiology. Underwent upper endoscopy and colonoscopy on 03/06/2022. Upper endoscopy showed hiatal hernia, but was otherwise normal.  Colonoscopy revealed 2 polyps, 5 and 7 mm in size that were removed, otherwise was unremarkable.    03/11/2022 - GI doctor was arranging capsule endoscopy. Was feeling better. Plavix was held. Was smoking 2 PPD. Monitor revealed SR,  PAC's < 1% burden. Brief run of SVT, not sustained. Rare PVC's < 1% total beats, had 4 beat run of NSVT vs aberrant SVT, no sustained pauses or arrhthymias. 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 Few non-bleeding erosions noted along ileocecal valve without masses, no active bleeding noted. Was contacted by GI PA-C that  he was okay to resume ASA and Plavix.   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 Was evaluated d/t sudden onset of R sided weakness and aphasia.  Was intubated for severe agitation to receive TNK, transferred to [facility]  MRI negative for new stroke, evidence of chronic stroke and superior parietal lobe that could be source for seizures.  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  Started on Depakote, was told to follow-up with neurology.  Given stroke concerns, patient would continue DAPT therapy for 3 weeks, then aspirin 81 mg daily for life.   11/12/2022 - Doing better from when I last saw him in office. Reported elevated BP. Could sense that BP was elevated, SBP readings averaging 150s -160's. Denied any chest pain, shortness of breath, palpitations, syncope, presyncope, orthopnea, PND, swelling or significant weight changes, acute bleeding, or claudication. Continued to note dizziness with certain head movements, denied vertigo symptoms. Medication adjusted.   11/20/2022 - Here for telephone visit for HTN follow-up. BP log reviewed with patient's wife and SBP averaging 160's-180's. Lowest BP reading 148/82. Compliant with his medications. Pt does report some cough/congestion and feeling under the weather, going to see his PCP regarding this. Does have some shortness of breath related to his current illness. Denies any stroke like symptoms. Denies any anginal chest pain, palpitations, syncope, presyncope, dizziness, orthopnea, PND, swelling or significant weight changes, acute bleeding, or claudication.  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 Loves to do yard work.    Please see the history of present illness.    All other systems reviewed and are negative.  ROS:   Please see the history of present illness.    All other systems reviewed  and are negative.  Prior CV studies:   The following studies were reviewed today:  Carotid duplex 10/2022:  Summary:  Right Carotid: Velocities in the right ICA are consistent with a 1-39%  stenosis. Non-hemodynamically significant plaque <50% noted in the CCA.   Left Carotid: Non-hemodynamically significant plaque <50% noted in the CCA. The ECA appears >50% stenosed. Patent left ICA stent. Focal, calcific plaque at the mid segments with 50-75% stenosis, lower end of range.   Vertebrals:  Bilateral vertebral arteries demonstrate antegrade flow.  Subclavians: Normal flow hemodynamics were seen in bilateral subclavian arteries.   Echo 10/2022: 1. Left ventricular ejection fraction, by estimation, is 55 to 60%. The  left ventricle has normal function. The left ventricle has no regional  wall motion abnormalities. There is moderate concentric left ventricular hypertrophy. Left ventricular diastolic parameters are consistent with Grade I diastolic dysfunction (impaired relaxation).   2. Right ventricular systolic function is normal. The right ventricular  size is normal.   3. The mitral valve is normal in structure. No evidence of mitral valve regurgitation. No evidence of mitral stenosis.   4. The aortic valve was not well visualized. Aortic valve regurgitation  is trivial. No aortic stenosis is present.   5. The inferior vena cava is normal in size with greater than 50%  respiratory variability, suggesting right atrial pressure of 3 mmHg.   6. Agitated saline contrast bubble study was negative, with no evidence of any interatrial shunt.   Comparison(s): No significant change from prior study. Prior images  reviewed side by side.   Cardiac monitor on 03/14/2022: Predominant rhythm is sinus with heart rate ranging from 67 bpm up to 161 bpm and average heart rate 90 bpm. There were rare PACs including atrial couplets representing less than 1% total beats.  There was a single brief run of SVT  lasting 5 beats. There were rare PVCs representing less than 1% total beats.  Four beat run of NSVT versus aberrant SVT was noted. No sustained arrhythmias or pauses.   Echocardiogram on 02/24/2022: 1. Left ventricular ejection fraction, by estimation, is 55 to 60%. The  left ventricle has normal function. The left ventricle has no regional  wall motion abnormalities. Left ventricular diastolic parameters are  indeterminate.   2. Right ventricular systolic function is normal. The right ventricular  size is normal. Tricuspid regurgitation signal is inadequate for assessing  PA pressure.   3. The mitral valve is grossly normal. Trivial mitral valve  regurgitation. No evidence of mitral stenosis.   4. The aortic valve is tricuspid. Aortic valve regurgitation is mild. No  aortic stenosis is present.   5. The inferior vena cava is normal in size with greater than 50%  respiratory variability, suggesting right atrial pressure of 3 mmHg.   Conclusion(s)/Recommendation(s): Normal biventricular function without  evidence of hemodynamically significant valvular heart disease.   Bilateral carotid duplex on 11/14/2021: Summary:  Right Carotid: Velocities in the right ICA are consistent with a 1-39%  stenosis.   Left Carotid: Patent left ICA stent with no evidence of restenosis.   Vertebrals:  Bilateral vertebral arteries demonstrate antegrade flow.  Subclavians: Normal flow hemodynamics were seen in bilateral subclavian arteries.  Mesenteric limited on 08/21/2020: Summary:  Mesenteric:  70 to 99% stenosis in the superior mesenteric artery.  Unable to determine distal end of stent. Stenosis is either at the distal  end of  or distal to the stent.    Vascular US aorta/ IVC/ iliacs doppler on 09/28/2019: Summary:  Abdominal Aorta: The largest aortic measurement is 2.4 cm. Unable to  identify dissection of infrarenal aorta due to bowel gas.  The left common iliac artery measures  approximately 2.25 cm x 2.33 cm,  based on limited visualization due to overlying bowel gas.   2D echocardiogram on 12/11/2018: 1. Left ventricular ejection fraction, by visual estimation, is 60 to  65%. The left ventricle has normal function. There is moderately increased  left ventricular hypertrophy.   2. Left ventricular diastolic Doppler parameters are consistent with  impaired relaxation pattern of LV diastolic filling.   3. Global right ventricle has low normal systolic function.The right  ventricular size is normal. No increase in right ventricular wall  thickness.   4. Left atrial size was normal.   5. Right atrial size was normal.   6. Mild mitral annular calcification.   7. Mild to moderate aortic valve annular calcification.   8. The mitral valve is degenerative. No evidence of mitral valve  regurgitation.   9. The tricuspid valve is grossly normal. Tricuspid valve regurgitation  was not visualized by color flow Doppler.  10. The aortic valve is tricuspid Aortic valve regurgitation was not  visualized by color flow Doppler. Mild aortic valve sclerosis without  stenosis.  11. The pulmonic valve was not well visualized.  Pulmonic valve  regurgitation is not visualized by color flow Doppler.  12. The inferior vena cava is normal in size with greater than 50%  respiratory variability, suggesting right atrial pressure of 3 mmHg.    Myoview on 04/23/2013: IMPRESSION:  1. Negative Lexi scan myocardial perfusion imaging stress test   2. Normal left ventricular ejection fraction with normal wall  motion.   3. Low risk study for major cardiovascular events.    Labs/Other Tests and Data Reviewed:    EKG:  EKG is not ordered today.   Objective:    Vital Signs:  BP (!) 161/98   Pulse 89   Wt 130 lb (59 kg)   BMI 20.36 kg/m    Due to nature of today's visit, a physical exam was unable to be performed.   ASSESSMENT & PLAN:    Resistant HTN BP elevated and not at goal.  SBP averaging 160's - 180's. SBP < 140. Denies any concerning red flag signs/symptoms. Will arrange renal artery duplex for further evaluation. Will stop Metoprolol and start Carvedilol 12.5 mg BID. Discussed to monitor BP at home at least 2 hours after medications and sitting for 5-10 minutes. Will bring him back in 1 week for a nurse visit for a BP check and he will bring in his BP log and BP machine for further evaluation. Will send message to PCP to get a BMET in 1 week. If blood pressure continues to not be at goal, plan to increase Coreg to 25 mg BID or consider starting amlodipine. Will also place referral to advanced HTN clinic. Low salt, heart healthy diet recommended. Care and ED precautions discussed.  2. CAD, s/p NSTEMI  Admitted 02/2022 with NSTEMI in setting of anemia d/t GI bleed with positive hemoccult (demand ischemia). It was decided that could not undergo cardiac cath until anemia was resolved. Capsule endoscopy results showed no evidence of active bleeding. Upper endoscopy and lower endoscopy were overall unremarkable and did not show source of bleeding. Stable with no anginal symptoms, chest pain associated with coughing - current cold. No indication for ischemic evaluation at this time, working on BP control - see above. Heart healthy diet encouraged. ED precautions discussed. Continue to follow with PCP. If his chest pain were to recur, would have low threshold for considering pursuing cardiac catheterization.   PAD He is s/p right CEA in 2021, stenting to SMA in 2022,as well as TCAR (L) in 10/2021. Continue current medication regimen. Follow-up with [facility]. Denies any dizziness. Heart healthy diet encouraged.   HLD, hypertriglyceridemia Labs from 10/2022 revealed total cholesterol 85, HDL 35, LDL 8, and triglycerides 212.  Continue current medication regimen. Not addressed, but for his chronically elevated TG, plan to discuss increasing Fenofibrate to 160 mg daily at next OV. Heart  healthy diet encouraged.   5. Hx of TIA's/CVA, hx of seizures Hx of multiple TIA's, recent MRI revealed no acute intracranial abnormality, chronic small vessel ischemia, hemosiderin disposition at the superior left parietal lobe, multifocal hyperintense T2-weighted signal within the white matter.  Chronic stroke evident along superior parietal lobe, felt to be the source of his seizures.  Completed Plavix therapy per his report. Continue Aspirin as instructed as d/c. Care and ED precautions discussed. Continue to follow with PCP and Neurology.   7. Tobacco abuse Still smoking. Smoking cessation encouraged and discussed.   8. Cough, congestion, shortness of breath Does show symptoms of current/possible URI with some occasional shortness of breath. Follow-up with PCP as scheduled.  Time:   Today, I have spent 9 minutes with the patient with telehealth technology discussing the above problems.     Medication Adjustments/Labs and Tests Ordered: Current medicines are reviewed at length with the patient today.  Concerns regarding medicines are outlined above.   Tests Ordered: Orders Placed This Encounter  Procedures   US RENAL ARTERY DUPLEX COMPLETE   AMB REFERRAL TO ADVANCED HTN CLINIC    Medication Changes: Meds ordered this encounter  Medications   carvedilol (COREG) 12.5 MG tablet    Sig: Take 1 tablet (12.5 mg total) by mouth 2 (two) times daily with a meal.    Dispense:  60 tablet    Refill:  2    11/20/2022-New    Follow Up: For nurse visit for BP check in 1 week. Follow-up with me/[facility] In Person as scheduled.  SignedSharlene Dory, NP  11/20/2022 11:49 AM    [facility]

## 2022-11-21 ENCOUNTER — Ambulatory Visit (HOSPITAL_COMMUNITY): Payer: Medicare PPO | Admitting: Physical Therapy

## 2022-11-21 ENCOUNTER — Other Ambulatory Visit: Payer: Self-pay | Admitting: Internal Medicine

## 2022-11-21 DIAGNOSIS — I2510 Atherosclerotic heart disease of native coronary artery without angina pectoris: Secondary | ICD-10-CM | POA: Diagnosis not present

## 2022-11-21 DIAGNOSIS — R262 Difficulty in walking, not elsewhere classified: Secondary | ICD-10-CM

## 2022-11-21 DIAGNOSIS — I1A0 Resistant hypertension: Secondary | ICD-10-CM | POA: Diagnosis not present

## 2022-11-21 DIAGNOSIS — E785 Hyperlipidemia, unspecified: Secondary | ICD-10-CM | POA: Diagnosis not present

## 2022-11-21 DIAGNOSIS — I739 Peripheral vascular disease, unspecified: Secondary | ICD-10-CM | POA: Diagnosis not present

## 2022-11-21 DIAGNOSIS — M6281 Muscle weakness (generalized): Secondary | ICD-10-CM | POA: Diagnosis not present

## 2022-11-21 DIAGNOSIS — E781 Pure hyperglyceridemia: Secondary | ICD-10-CM | POA: Diagnosis not present

## 2022-11-21 DIAGNOSIS — I10 Essential (primary) hypertension: Secondary | ICD-10-CM

## 2022-11-21 DIAGNOSIS — R296 Repeated falls: Secondary | ICD-10-CM

## 2022-11-21 NOTE — Therapy (Addendum)
OUTPATIENT PHYSICAL THERAPY TREATMENT discharge  Patient Name: Jimmy Duran MRN: [id number] DOB:08-01-1955, 67 y.o., male Today's Date: 11/21/2022 PHYSICAL THERAPY DISCHARGE SUMMARY  Visits from Start of Care: 3  Current functional level related to goals / functional outcomes: See below   Remaining deficits: See below    Education / Equipment: HEP    Patient agrees to discharge. Patient goals were met. Patient is being discharged due to being pleased with the current functional level.   PCP: Christel Mormon REFERRING PROVIDER: Micki Riley, MD  END OF SESSION:  PT End of Session - 11/21/22 1515     Visit Number 3    Number of Visits 3   Date for PT Re-Evaluation 12/23/22    Authorization Type [employer]    Authorization Time Period 12 visits approved from 9/09-->12/17/22    Authorization - Visit Number 3    Authorization - Number of Visits 12    Progress Note Due on Visit 10    PT Start Time 1430    PT Stop Time 1500    PT Time Calculation (min) 30 min    Activity Tolerance Patient tolerated treatment well    Behavior During Therapy [facility] for tasks assessed/performed               Past Medical History:  Diagnosis Date   AAA (abdominal aortic aneurysm) (HCC)    Arthritis    Carotid artery disease (HCC)    Cataract    Colon polyp    COPD (chronic obstructive pulmonary disease) (HCC)    Coronary artery disease    Nonobstructive   Essential hypertension    Focal seizure (HCC) 10/15/2022   GERD (gastroesophageal reflux disease)    Hemorrhoids    History of TIA (transient ischemic attack)    Low back pain    Lumbar radiculopathy    Mixed hyperlipidemia    Palpitations    Peripheral vascular disease (HCC)    Shingles 09/23/2019   Stroke (HCC)    Tobacco dependence    Type 2 diabetes mellitus (HCC)    Past Surgical History:  Procedure Laterality Date   APPENDECTOMY  1970   BIOPSY  12/08/2017   Procedure: BIOPSY;  Surgeon: Corbin Ade, MD;   [facility];  Service: Endoscopy;;  esophagus    BIOPSY  12/28/2020   Procedure: BIOPSY;  Surgeon: Corbin Ade, MD;  [facility];  Service: Endoscopy;;   BIOPSY  07/12/2021   Procedure: BIOPSY;  Surgeon: Corbin Ade, MD;  [facility];  Service: Endoscopy;;   BREAST SURGERY Right    Cyst resection on the right   CARDIAC CATHETERIZATION  1990's   in [place]. no stent placement   CATARACT EXTRACTION W/PHACO Right 06/18/2016   Procedure: CATARACT EXTRACTION PHACO AND INTRAOCULAR LENS PLACEMENT (IOC);  Surgeon: Galen Manila, MD;  Location: [facility];  Service: Ophthalmology;  Laterality: Right;  [name] 35.7 AP% 16.6 CDE 5.94 Fluid pack lot # [id number]   CATARACT EXTRACTION W/PHACO Left 07/16/2016   Procedure: CATARACT EXTRACTION PHACO AND INTRAOCULAR LENS PLACEMENT (IOC);  Surgeon: Galen Manila, MD;  Location: [facility];  Service: Ophthalmology;  Laterality: Left;  [name] 51.8 AP% 12.7 CDE 6.58 Fluid Pack Lot # [id number]   CHOLECYSTECTOMY  1993   COLONOSCOPY  02/12/2007   UYQ:[id number] friable anal canal hemorrhoids, otherwise normal rectum and colon   COLONOSCOPY N/A 07/16/2012   RMR: Colonic diverticulosis. 4 mm tubular adenoma   COLONOSCOPY WITH PROPOFOL  N/A 12/08/2017   Dr. Jena Gauss: sigmoid and descending colon diverticulosis, transverse colon polyp (TUBULAR ADENOMA)   COLONOSCOPY WITH PROPOFOL N/A 03/06/2022   Procedure: COLONOSCOPY WITH PROPOFOL;  Surgeon: Corbin Ade, MD;  [facility];  Service: Endoscopy;  Laterality: N/A;  12:30 PM   ENDARTERECTOMY Right 10/08/2019   Procedure: RIGHT CAROTID ENDARTERECTOMY;  Surgeon: Larina Earthly, MD;  Location: [facility];  Service: Vascular;  Laterality: Right;   ESOPHAGOGASTRODUODENOSCOPY (EGD) WITH ESOPHAGEAL DILATION N/A 07/16/2012   RMR: +Candida esophagitis, Erosive reflux esophagiits. Schatizi's ring status post dilation. Hiatal hernia. Antral erosions status post biopsy.     ESOPHAGOGASTRODUODENOSCOPY (EGD) WITH PROPOFOL N/A 12/08/2017   Dr. Jena Gauss: esophagitis, query EOE but negative for increased eosinophils on path, small hiatal hernia, normal stomach, normal duodenum, PAS stain with rare yeast forms on stain. Empiric dilation   ESOPHAGOGASTRODUODENOSCOPY (EGD) WITH PROPOFOL N/A 12/28/2020   esophagitis without bleeding, s/p biopsy and dilation. Antral erosions s/p biopsy.   ESOPHAGOGASTRODUODENOSCOPY (EGD) WITH PROPOFOL N/A 07/12/2021   Surgeon: Corbin Ade, MD;  Cobblestoning appearance of distal esophagus, slight narrowing of distal esophagus s/p dilation with 56 Fr and biopsy, gastric erosions biopsied, normal examined duodenum.  Esophageal biopsy with reactive squamous mucosa, gastric biopsy with reactive gastropathy, negative for H. pylori.   ESOPHAGOGASTRODUODENOSCOPY (EGD) WITH PROPOFOL N/A 03/06/2022   Procedure: ESOPHAGOGASTRODUODENOSCOPY (EGD) WITH PROPOFOL;  Surgeon: Corbin Ade, MD;  [facility];  Service: Endoscopy;  Laterality: N/A;   EYE SURGERY Bilateral 2018   cataract   GIVENS CAPSULE STUDY N/A 03/13/2022   Procedure: GIVENS CAPSULE STUDY;  Surgeon: Corbin Ade, MD;  [facility];  Service: Endoscopy;  Laterality: N/A;  7:30am   HERNIA REPAIR     JOINT REPLACEMENT     KNEE SURGERY     Left knee arthroscopy torn medial meniscus grade 4 chondral changes medial femoral condyle tibial plateau   MALONEY DILATION N/A 12/08/2017   Procedure: MALONEY DILATION;  Surgeon: Corbin Ade, MD;  [facility];  Service: Endoscopy;  Laterality: N/A;   MALONEY DILATION N/A 12/28/2020   Procedure: [name] DILATION;  Surgeon: Corbin Ade, MD;  [facility];  Service: Endoscopy;  Laterality: N/A;   MALONEY DILATION N/A 07/12/2021   Procedure: [name] DILATION;  Surgeon: Corbin Ade, MD;  [facility];  Service: Endoscopy;  Laterality: N/A;   PATCH ANGIOPLASTY Right 10/08/2019    Procedure: PATCH ANGIOPLASTY of the right common carotid artery using hemashield plaltinum finesse patch;  Surgeon: Larina Earthly, MD;  Location: [facility];  Service: Vascular;  Laterality: Right;   PERIPHERAL VASCULAR INTERVENTION  07/17/2020   Procedure: PERIPHERAL VASCULAR INTERVENTION;  Surgeon: Maeola Harman, MD;  Location: [facility] INVASIVE CV LAB;  Service: Cardiovascular;;   POLYPECTOMY  12/08/2017   Procedure: POLYPECTOMY;  Surgeon: Corbin Ade, MD;  [facility];  Service: Endoscopy;;  colon   POLYPECTOMY  03/06/2022   Procedure: POLYPECTOMY;  Surgeon: Corbin Ade, MD;  [facility];  Service: Endoscopy;;   TOTAL KNEE ARTHROPLASTY Left 04/27/2012   Procedure: TOTAL KNEE ARTHROPLASTY;  Surgeon: Vickki Hearing, MD;  Location: [facility];  Service: Orthopedics;  Laterality: Left;   TRANSCAROTID ARTERY REVASCULARIZATION  Left 10/12/2021   Procedure: Left Transcarotid Artery Revascularization;  Surgeon: Maeola Harman, MD;  Location: [facility] OR;  Service: Vascular;  Laterality: Left;   UMBILICAL HERNIA REPAIR  1993   VISCERAL ANGIOGRAPHY  N/A 07/17/2020   Procedure: mesenteric ANGIOGRAPHY;  Surgeon: Maeola Harman, MD;  Location: [facility] INVASIVE CV LAB;  Service: Cardiovascular;  Laterality: N/A;   Patient Active Problem List   Diagnosis Date Noted   Focal seizure (HCC) 10/15/2022   Protein-calorie malnutrition, severe 10/14/2022   Stroke determined by clinical assessment (HCC) 10/12/2022   Bilateral groin pain 09/17/2022   Community acquired pneumonia 08/20/2022   Right-sided chest pain 08/20/2022   Dysuria 07/25/2022   Abdominal pain 06/19/2022   Occult GI bleeding 03/14/2022   NSTEMI (non-ST elevated myocardial infarction) (HCC) 02/24/2022   Peripheral vascular disease (HCC) 02/24/2022   Acute on chronic blood loss anemia 02/24/2022   Acute blood loss anemia 02/24/2022   Acute otitis media 01/23/2022   Iron deficiency anemia,  unspecified 12/25/2021   Need for influenza vaccination 12/14/2021   Unintentional weight loss 12/14/2021   Carotid stenosis, asymptomatic, left 10/12/2021   Asymptomatic carotid artery stenosis without infarction, right 10/08/2019   Abdominal pain, left lower quadrant 06/18/2018   History of colonic polyps 11/05/2017   Diarrhea 08/15/2014   Dizziness 01/04/2014   Transient ischemic attack 01/04/2014   Palpitations 04/22/2013   Esophageal dysphagia 07/02/2012   OA (osteoarthritis) of knee 06/17/2011   Gastroesophageal reflux disease 05/04/2010   DM type 2 causing vascular disease (HCC) 05/04/2010   COPD (chronic obstructive pulmonary disease) (HCC) 05/04/2010   Hemorrhoids 05/04/2010   Mixed hyperlipidemia 04/25/2010   Current smoker 04/25/2010   Essential hypertension, benign 04/25/2010   CORONARY ATHEROSCLEROSIS NATIVE CORONARY ARTERY 04/25/2010   CAROTID ARTERY DISEASE 04/25/2010    ONSET DATE: 10/12/22  REFERRING DIAG: I63.9 (ICD-10-CM) - Acute ischemic stroke (HCC) R56.9 (ICD-10-CM) - Focal seizure (HCC)  THERAPY DIAG:  Muscle weakness (generalized)  Difficulty in walking, not elsewhere classified  Repeated falls  Rationale for Evaluation and Treatment: Rehabilitation  SUBJECTIVE:                                                                                                                                                                                             SUBJECTIVE STATEMENT: 11/21/22:  Pt states he would like to be done today.  States he's walking and doing his exercises daily and feels he is back to normal and stronger. No reports of pain.    Eval:  Pt states that he has been trying to walk since he left the hospital but he is having pain in his Rt leg especially from the knee down pain will go as high as a 10/10.  He has had several mini strokes but never a completed stroke.  He is dragging his Right  leg and wants to be able to walk better. 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  Pt accompanied by: self  PERTINENT HISTORY: per MD : Bruna Potter is an 67 y.o. male with a past medical history of multiple TIAs, bilateral carotid revascularizations due to carotid artery stenosis bilaterally, AAA, tobacco abuse, COPD, peripheral vascular disease and DM2 who initially presented to the [facility] emergency department for evaluation of sudden onset of right-sided weakness and aphasia. NIHSS 14. He was intubated for severe agitation to receive TNK and prior to transfer to [facility] [place].  In [place] [facility], patient was extubated on 8/12. During intubating, stroke symptoms were not present as evident by limited exam. Following extubation, he had no stroke related symptoms. 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 PAIN:  Are you having pain? No  PRECAUTIONS: Fall    FALLS: Has patient fallen in last 6 months? Yes. Number of falls 3  LIVING ENVIRONMENT: Lives with: lives with their family Lives in: House/apartment Stairs: Yes: External: 1 steps; none Has following equipment at home: Quad cane large base and None; does not like the quad cane.   PLOF: Independent  PATIENT GOALS: get back as normal as I can  OBJECTIVE:   DIAGNOSTIC FINDINGS: see above   COGNITION: Overall cognitive status: Within functional limits for tasks assessed     LOWER EXTREMITY ROM:   WFL LOWER EXTREMITY MMT:    MMT Right Eval Right  11/21/22 Left Eval  Hip flexion 3+ 5   Hip extension 3 4+   Hip abduction 4 5   Hip adduction     Hip internal rotation      Hip external rotation     Knee flexion 3 5   Knee extension 3+ 5   Ankle dorsiflexion 3- 4+   Ankle plantarflexion     Ankle inversion     Ankle eversion     (Blank rows = not tested)  BED MOBILITY:  I   TRANSFERS: I FUNCTIONAL TESTS:  Evaluation:  Single leg stance:  Rt: 0"   , Lt :  8" 5 times sit to stand: 13.7 30 seconds chair stand test : 11 ,( 9 is poor, 13 below average for age and sex) 2 minute walk test: 248 ft   11/21/22 Single leg stance:  bil 20" without UE assist ( was Rt: 0"   , Lt :  8") 5 times sit to stand: 8.5 sec (was 13.7 sec) 30 seconds chair stand test : 22 (was 11) ( 9 is poor, 13 below average for age and sex) 2 minute walk test: 500 ft no AD( was 248 ft)    TODAY'S TREATMENT:  DATE:  11/21/22:  Reassessment/discharge MMT (see above) Functional measures (see above) HEP review/additional education Stair negotiation 6" stairs no HR reciprocally ascending/descending  11/19/22:  Reviewed goals  Educated importance of HEP compliance for maximal benefits Sit to stand 10x no HHA Standing: heel raise on incline slope 15x  Toe raises on decline slope 15x  Squat front of chair 10x   SLS Rt 10", Lt 6"  Sidestep with RTB around thigh 3RT with SBA  Abduct with RTB 10x  Vector stance 3x 5"  Tandem stance 2x 30"  Gastroc stretch 2x 30"  - Squat with Chair Touch  - 1 x daily - 7 x weekly - 3 sets - 10 reps - Standing Hip Abduction with Resistance at Ankles and Unilateral Counter Support  - 1 x daily - 7 x weekly - 3 sets - 10 reps - Side Stepping with Resistance at Thighs and Counter Support  - 1 x daily - 7 x weekly - 3 sets - 10 reps - Standing Tandem Balance with Unilateral Counter Support  - 1 x daily - 7 x weekly - 1 sets - 3 reps - 30" hold  11/11/22 Eval:  Seated Ankle Pumps  - 2 x daily - 7 x weekly - 1 sets - 10 reps - 3-5"  hold  - Sit to Stand  - 2 x daily - 7 x weekly - 1 sets - 10 reps  - Seated Long Arc Quad  - 2 x daily - 7 x weekly - 1 sets - 10 reps - 3-5 hold  -bridge x 10    PATIENT EDUCATION: Education details: HEP Person educated: Patient Education method: Explanation, Verbal cues, and Handouts Education comprehension: verbalized understanding and returned demonstration  HOME EXERCISE PROGRAM: Access Code: [email or web address] URL: [email or web address] Date: 11/11/2022 Prepared by: Virgina Organ  Exercises - Seated Ankle Pumps  - 2 x daily - 7 x weekly - 1 sets - 10 reps - 3-5" hold - Sit to Stand  - 2 x daily - 7 x weekly - 1 sets - 10 reps - Seated Long Arc Quad  - 2 x daily - 7 x weekly - 1 sets - 10 reps - 3-5 hold             -bridge x 10   GOALS: Goals reviewed with patient? Yes 11/19/22  SHORT TERM GOALS: Target date: 12/02/22  PT I in HEP in order to decrease his leg pain to no greater than a 6/10 Baseline: Goal status: MET  2.  Pt strength to be increased 1/2 grade to allow to be able to come sit to stand from low couch Baseline:  Goal status: MET  3.  PT to be able to single leg stance on both LE for 10 seconds to decrease risk of falling  Baseline:  Goal status: MET   LONG TERM GOALS: Target date: 12/23/22  PT I in HEP in order to decrease his leg pain to no greater than a 3/10 Baseline: Goal status: MET  2.  Pt strength to be increased 1/2 grade to allow to be able to go up and down 12 steps in a reciprocal manner  Baseline:  Goal status: MET  3.  PT to be able to single leg stance on both LE for 20 seconds to decrease risk of falling Baseline:  Goal status: MET  4.  PT to be back working with  his wood working  Baseline:  Goal status: MET  L  ASSESSMENT:  CLINICAL IMPRESSION: 11/21/22: Pt requesting discharge this session.  Manual muscle and functional test measures completed with vast improvement noted within the last week of treatment.  Pt is  able to negotiate 6 inch steps reciprocally without handrail both ascending and descending.  Pt is walking daily, reports good stability and completing his exercises.  Pt feels he is ready for discharge and has met all goals as established in therapy.     Eval: Patient is a 67 y.o. male who was seen today for physical therapy evaluation and treatment for CVA.  Examination demonstrates decreased ROM, decreased strength, decreased activity tolerance and decreased balance.  Mr. Espinel will benefit from skilled PT to address these issues and maximize his functional ability.   OBJECTIVE IMPAIRMENTS: Abnormal gait, decreased activity tolerance, decreased balance, difficulty walking, decreased ROM, decreased strength, and pain.   ACTIVITY LIMITATIONS: carrying, lifting, standing, and locomotion level  PARTICIPATION LIMITATIONS: shopping, community activity, and wood working   [facility] POTENTIAL: Good  CLINICAL DECISION MAKING: Stable/uncomplicated  EVALUATION COMPLEXITY: Moderate  PLAN:  PT FREQUENCY: 2x/week  PT DURATION: 6 weeks  PLANNED INTERVENTIONS: Therapeutic exercises, Therapeutic activity, Neuromuscular re-education, Balance training, Gait training, Patient/Family education, and Self Care  PLAN FOR NEXT SESSION: Discharge  Lurena Nida, PTA 11/21/2022, 3:37 PM  Virgina Organ, PT CLT [phone]

## 2022-11-25 ENCOUNTER — Ambulatory Visit (HOSPITAL_COMMUNITY): Payer: Medicare PPO | Admitting: Physical Therapy

## 2022-11-27 ENCOUNTER — Ambulatory Visit (HOSPITAL_COMMUNITY): Payer: Medicare PPO | Admitting: Physical Therapy

## 2022-11-27 ENCOUNTER — Ambulatory Visit (INDEPENDENT_AMBULATORY_CARE_PROVIDER_SITE_OTHER): Payer: Medicare PPO | Admitting: *Deleted

## 2022-11-27 ENCOUNTER — Encounter: Payer: Self-pay | Admitting: *Deleted

## 2022-11-27 ENCOUNTER — Telehealth: Payer: Self-pay | Admitting: *Deleted

## 2022-11-27 VITALS — BP 148/88 | HR 80 | Ht 67.0 in | Wt 129.6 lb

## 2022-11-27 DIAGNOSIS — I10 Essential (primary) hypertension: Secondary | ICD-10-CM

## 2022-11-27 MED ORDER — CARVEDILOL 25 MG PO TABS: 25.0000 mg | ORAL_TABLET | Freq: Two times a day (BID) | ORAL | 1 refills | Status: DC

## 2022-11-27 NOTE — Telephone Encounter (Signed)
-----   Message from Sharlene Dory sent at 11/27/2022 12:02 PM EDT ----- Regarding: BP check and recommendations Hello Isabelle Course,   Thank you for sending me the nurse visit with his BP readings. It appears his home cuff is around 10-20 mmHg higher than our office manual cuff BP reading, however his BP is improved from previous visit BP reading, but still not at goal. I discussed with him over the telephone last time that it sounded like he was having URI symptoms, and I have seen where he was diagnosed with bronchitis recently by his PCP.   At this time, I would recommend increasing his carvedilol to 25 mg BID to get his BP at goal. HR is good to also increase this. Continue to have him log his HR and BP at home and bring this back to me at the next office visit. I also believe his BP will continue to improve over time as he gets over his illness. If he has any further questions or concerns, he can let us know. His wife does an excellent job providing care for him.   Thank you so much! I will CC Dr. Diona Browner so he is aware.   Best,  Sharlene Dory, NP ----- Message ----- From: Eustace Moore, RN Sent: 11/27/2022   9:12 AM EDT To: Sharlene Dory, NP

## 2022-11-27 NOTE — Progress Notes (Signed)
Presents to office for nurse visit for BP check with home BP monitor per last phone visit with Philis Nettle. Medications reviewed. Reports taking all medications as prescribed without side effects. Denies dizziness, chest pain or SOB. After sitting 10 minutes, vitals taken and compared to home BP monitor. See BP readings below. Sent to provider for review.  Left arm office 146/90 Left arm home 168/95 Right arm office 148/88 Right arm home 158/86

## 2022-11-27 NOTE — Telephone Encounter (Signed)
Patient Jimmy Duran informed and verbalized understanding of plan.

## 2022-11-27 NOTE — Patient Instructions (Signed)
Your physician recommends that you continue on your current medications as directed. Please refer to the Current Medication list given to you today.  We will contact you with any medication adjustments

## 2022-12-03 ENCOUNTER — Ambulatory Visit (HOSPITAL_COMMUNITY): Payer: Medicare PPO | Admitting: Physical Therapy

## 2022-12-04 ENCOUNTER — Other Ambulatory Visit: Payer: Self-pay | Admitting: *Deleted

## 2022-12-04 DIAGNOSIS — I6523 Occlusion and stenosis of bilateral carotid arteries: Secondary | ICD-10-CM

## 2022-12-05 ENCOUNTER — Ambulatory Visit: Payer: Self-pay | Admitting: Nurse Practitioner

## 2022-12-06 ENCOUNTER — Ambulatory Visit (HOSPITAL_COMMUNITY): Payer: Medicare PPO | Admitting: Physical Therapy

## 2022-12-10 ENCOUNTER — Ambulatory Visit (HOSPITAL_COMMUNITY): Payer: Medicare PPO

## 2022-12-12 ENCOUNTER — Ambulatory Visit (HOSPITAL_COMMUNITY): Payer: Medicare PPO | Admitting: Physical Therapy

## 2022-12-17 ENCOUNTER — Ambulatory Visit: Payer: Medicare PPO | Admitting: Internal Medicine

## 2022-12-17 ENCOUNTER — Ambulatory Visit (HOSPITAL_COMMUNITY): Payer: Medicare PPO | Admitting: Physical Therapy

## 2022-12-17 ENCOUNTER — Encounter: Payer: Self-pay | Admitting: Internal Medicine

## 2022-12-17 VITALS — BP 106/69 | HR 86 | Ht 67.0 in | Wt 132.4 lb

## 2022-12-17 DIAGNOSIS — E1159 Type 2 diabetes mellitus with other circulatory complications: Secondary | ICD-10-CM

## 2022-12-17 DIAGNOSIS — I69398 Other sequelae of cerebral infarction: Secondary | ICD-10-CM | POA: Diagnosis not present

## 2022-12-17 DIAGNOSIS — R569 Unspecified convulsions: Secondary | ICD-10-CM

## 2022-12-17 DIAGNOSIS — Z23 Encounter for immunization: Secondary | ICD-10-CM

## 2022-12-17 DIAGNOSIS — Z7985 Long-term (current) use of injectable non-insulin antidiabetic drugs: Secondary | ICD-10-CM | POA: Diagnosis not present

## 2022-12-17 DIAGNOSIS — Z794 Long term (current) use of insulin: Secondary | ICD-10-CM | POA: Diagnosis not present

## 2022-12-17 MED ORDER — SEMAGLUTIDE (1 MG/DOSE) 4 MG/3ML ~~LOC~~ SOPN: 1.0000 mg | PEN_INJECTOR | SUBCUTANEOUS | 2 refills | Status: DC

## 2022-12-17 NOTE — Progress Notes (Signed)
Established Patient Office Visit  Subjective   Patient ID: Jimmy Duran, male    DOB: 12/03/1955  Age: 67 y.o. MRN: [id number]  Chief Complaint  Patient presents with   Follow-up    Seizures    Jimmy Duran returns to care today for routine follow-up.  He was last evaluated by me on 7/16 for follow-up of HTN and DM.  Losartan was discontinued at that time and lisinopril was resumed.  49-month follow-up was arranged.  In the interim, he presented to the emergency department on 8/10 in the setting of sudden onset right sided weakness and aphasia.  Intubated for airway protection and transferred to [facility].  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  He was started on Depakote and referred to neurology for outpatient follow-up.  Placed on DAPT x 3 weeks due to concern for stroke.  Discharged 8/13.  He was evaluated on 9/4 at [facility] for hospital follow-up.  He has also been evaluated by cardiology and neurology.  Jimmy Duran reports feeling well today.  He endorses right foot paresthesias but is otherwise asymptomatic and has no acute concerns to discuss.  He has not experienced any seizure activity since hospital discharge.  Past Medical History:  Diagnosis Date   AAA (abdominal aortic aneurysm) (HCC)    Arthritis    Carotid artery disease (HCC)    Cataract    Colon polyp    COPD (chronic obstructive pulmonary disease) (HCC)    Coronary artery disease    Nonobstructive   Essential hypertension    Focal seizure (HCC) 10/15/2022   GERD (gastroesophageal reflux disease)    Hemorrhoids    History of TIA (transient ischemic attack)    Low back pain    Lumbar radiculopathy    Mixed hyperlipidemia    Palpitations    Peripheral vascular disease (HCC)    Shingles 09/23/2019   Stroke (HCC)    Tobacco dependence    Type 2 diabetes mellitus (HCC)    Past Surgical History:  Procedure Laterality Date   APPENDECTOMY  1970   BIOPSY  12/08/2017   Procedure: BIOPSY;  Surgeon:  Corbin Ade, MD;  [facility];  Service: Endoscopy;;  esophagus    BIOPSY  12/28/2020   Procedure: BIOPSY;  Surgeon: Corbin Ade, MD;  [facility];  Service: Endoscopy;;   BIOPSY  07/12/2021   Procedure: BIOPSY;  Surgeon: Corbin Ade, MD;  [facility];  Service: Endoscopy;;   BREAST SURGERY Right    Cyst resection on the right   CARDIAC CATHETERIZATION  1990's   in [place]. no stent placement   CATARACT EXTRACTION W/PHACO Right 06/18/2016   Procedure: CATARACT EXTRACTION PHACO AND INTRAOCULAR LENS PLACEMENT (IOC);  Surgeon: Galen Manila, MD;  Location: [facility];  Service: Ophthalmology;  Laterality: Right;  [name] 35.7 AP% 16.6 CDE 5.94 Fluid pack lot # [id number]   CATARACT EXTRACTION W/PHACO Left 07/16/2016   Procedure: CATARACT EXTRACTION PHACO AND INTRAOCULAR LENS PLACEMENT (IOC);  Surgeon: Galen Manila, MD;  Location: [facility];  Service: Ophthalmology;  Laterality: Left;  [name] 51.8 AP% 12.7 CDE 6.58 Fluid Pack Lot # [id number]   CHOLECYSTECTOMY  1993   COLONOSCOPY  02/12/2007   AOZ:[id number] friable anal canal hemorrhoids, otherwise normal rectum and colon   COLONOSCOPY N/A 07/16/2012   RMR: Colonic diverticulosis. 4 mm tubular adenoma   COLONOSCOPY WITH PROPOFOL N/A 12/08/2017   Dr. Jena Gauss: sigmoid and descending colon diverticulosis, transverse  colon polyp (TUBULAR ADENOMA)   COLONOSCOPY WITH PROPOFOL N/A 03/06/2022   Procedure: COLONOSCOPY WITH PROPOFOL;  Surgeon: Corbin Ade, MD;  [facility];  Service: Endoscopy;  Laterality: N/A;  12:30 PM   ENDARTERECTOMY Right 10/08/2019   Procedure: RIGHT CAROTID ENDARTERECTOMY;  Surgeon: Larina Earthly, MD;  Location: [facility];  Service: Vascular;  Laterality: Right;   ESOPHAGOGASTRODUODENOSCOPY (EGD) WITH ESOPHAGEAL DILATION N/A 07/16/2012   RMR: +Candida esophagitis, Erosive reflux esophagiits. Schatizi's ring status post dilation. Hiatal hernia. Antral erosions status post  biopsy.    ESOPHAGOGASTRODUODENOSCOPY (EGD) WITH PROPOFOL N/A 12/08/2017   Dr. Jena Gauss: esophagitis, query EOE but negative for increased eosinophils on path, small hiatal hernia, normal stomach, normal duodenum, PAS stain with rare yeast forms on stain. Empiric dilation   ESOPHAGOGASTRODUODENOSCOPY (EGD) WITH PROPOFOL N/A 12/28/2020   esophagitis without bleeding, s/p biopsy and dilation. Antral erosions s/p biopsy.   ESOPHAGOGASTRODUODENOSCOPY (EGD) WITH PROPOFOL N/A 07/12/2021   Surgeon: Corbin Ade, MD;  Cobblestoning appearance of distal esophagus, slight narrowing of distal esophagus s/p dilation with 56 Fr and biopsy, gastric erosions biopsied, normal examined duodenum.  Esophageal biopsy with reactive squamous mucosa, gastric biopsy with reactive gastropathy, negative for H. pylori.   ESOPHAGOGASTRODUODENOSCOPY (EGD) WITH PROPOFOL N/A 03/06/2022   Procedure: ESOPHAGOGASTRODUODENOSCOPY (EGD) WITH PROPOFOL;  Surgeon: Corbin Ade, MD;  [facility];  Service: Endoscopy;  Laterality: N/A;   EYE SURGERY Bilateral 2018   cataract   GIVENS CAPSULE STUDY N/A 03/13/2022   Procedure: GIVENS CAPSULE STUDY;  Surgeon: Corbin Ade, MD;  [facility];  Service: Endoscopy;  Laterality: N/A;  7:30am   HERNIA REPAIR     JOINT REPLACEMENT     KNEE SURGERY     Left knee arthroscopy torn medial meniscus grade 4 chondral changes medial femoral condyle tibial plateau   MALONEY DILATION N/A 12/08/2017   Procedure: MALONEY DILATION;  Surgeon: Corbin Ade, MD;  [facility];  Service: Endoscopy;  Laterality: N/A;   MALONEY DILATION N/A 12/28/2020   Procedure: [name] DILATION;  Surgeon: Corbin Ade, MD;  [facility];  Service: Endoscopy;  Laterality: N/A;   MALONEY DILATION N/A 07/12/2021   Procedure: [name] DILATION;  Surgeon: Corbin Ade, MD;  [facility];  Service: Endoscopy;  Laterality: N/A;   PATCH ANGIOPLASTY Right 10/08/2019    Procedure: PATCH ANGIOPLASTY of the right common carotid artery using hemashield plaltinum finesse patch;  Surgeon: Larina Earthly, MD;  Location: [facility];  Service: Vascular;  Laterality: Right;   PERIPHERAL VASCULAR INTERVENTION  07/17/2020   Procedure: PERIPHERAL VASCULAR INTERVENTION;  Surgeon: Maeola Harman, MD;  Location: [facility] INVASIVE CV LAB;  Service: Cardiovascular;;   POLYPECTOMY  12/08/2017   Procedure: POLYPECTOMY;  Surgeon: Corbin Ade, MD;  [facility];  Service: Endoscopy;;  colon   POLYPECTOMY  03/06/2022   Procedure: POLYPECTOMY;  Surgeon: Corbin Ade, MD;  [facility];  Service: Endoscopy;;   TOTAL KNEE ARTHROPLASTY Left 04/27/2012   Procedure: TOTAL KNEE ARTHROPLASTY;  Surgeon: Vickki Hearing, MD;  Location: [facility];  Service: Orthopedics;  Laterality: Left;   TRANSCAROTID ARTERY REVASCULARIZATION  Left 10/12/2021   Procedure: Left Transcarotid Artery Revascularization;  Surgeon: Maeola Harman, MD;  Location: [facility] OR;  Service: Vascular;  Laterality: Left;   UMBILICAL HERNIA REPAIR  1993   VISCERAL ANGIOGRAPHY N/A 07/17/2020   Procedure: mesenteric ANGIOGRAPHY;  Surgeon: Maeola Harman,  MD;  Location: [facility];  Service: Cardiovascular;  Laterality: N/A;   Social History   Tobacco Use   Smoking status: Every Day    Current packs/day: 0.50    Average packs/day: 0.5 packs/day for 30.0 years (15.0 ttl pk-yrs)    Types: Cigarettes   Smokeless tobacco: Former    Types: Chew    Quit date: 03/18/2018   Tobacco comments:    0.5 pk per day currently   Vaping Use   Vaping status: Former  Substance Use Topics   Alcohol use: Not Currently    Comment: socially "beer and tomato juice"    Drug use: No   Family History  Problem Relation Age of Onset   Dementia Mother    Hypertension Mother    Colon polyps Mother    Osteoporosis Mother    Cancer Father    Hypertension Father    Coronary artery  disease Father        CABG in his 28's   Heart attack Father    Cancer - Lung Father    Heart disease Brother        has a pacemaker   Emphysema Maternal Grandmother    Cancer Maternal Grandmother    Cancer - Lung Maternal Grandfather    Diabetes Maternal Grandfather    Cancer Paternal Grandmother    Heart attack Paternal Grandfather    Colon cancer Other        maternal great aunt   Colon cancer Other        paternal great aunt   No Known Allergies  Review of Systems  Neurological:  Positive for tingling (Right foot paresthesias).      Objective:     BP 106/69 (BP Location: Left Arm, Patient Position: Sitting, Cuff Size: Normal)   Pulse 86   Ht 5\' 7"  (1.702 m)   Wt 132 lb 6.4 oz (60.1 kg)   SpO2 96%   BMI 20.74 kg/m  BP Readings from Last 3 Encounters:  01/02/23 106/60  12/17/22 106/69  11/27/22 (!) 148/88   Physical Exam Vitals reviewed.  Constitutional:      General: He is not in acute distress.    Appearance: Normal appearance. He is not ill-appearing.  HENT:     Head: Normocephalic and atraumatic.     Right Ear: External ear normal.     Left Ear: External ear normal.     Nose: Nose normal. No congestion or rhinorrhea.     Mouth/Throat:     Mouth: Mucous membranes are moist.     Pharynx: Oropharynx is clear.  Eyes:     General: No scleral icterus.    Extraocular Movements: Extraocular movements intact.     Conjunctiva/sclera: Conjunctivae normal.     Pupils: Pupils are equal, round, and reactive to light.  Cardiovascular:     Rate and Rhythm: Normal rate and regular rhythm.     Pulses: Normal pulses.     Heart sounds: Murmur heard.  Pulmonary:     Effort: Pulmonary effort is normal.     Breath sounds: Normal breath sounds. No wheezing, rhonchi or rales.  Abdominal:     General: Abdomen is flat. Bowel sounds are normal. There is no distension.     Palpations: Abdomen is soft.     Tenderness: There is no abdominal tenderness.  Musculoskeletal:         General: No swelling or deformity. Normal range of motion.     Cervical back: Normal  range of motion.  Skin:    General: Skin is warm and dry.     Capillary Refill: Capillary refill takes less than 2 seconds.  Neurological:     General: No focal deficit present.     Mental Status: He is alert and oriented to person, place, and time.     Motor: No weakness.  Psychiatric:        Mood and Affect: Mood normal.        Behavior: Behavior normal.        Thought Content: Thought content normal.   Last CBC Lab Results  Component Value Date   WBC 6.6 11/06/2022   HGB 14.1 11/06/2022   HCT 42.5 11/06/2022   MCV 93 11/06/2022   MCH 30.7 11/06/2022   RDW 13.6 11/06/2022   PLT 315 11/06/2022   Last metabolic panel Lab Results  Component Value Date   GLUCOSE 138 (H) 11/06/2022   NA 141 11/06/2022   K 4.0 11/06/2022   CL 100 11/06/2022   CO2 23 11/06/2022   BUN 7 (L) 11/06/2022   CREATININE 0.85 11/06/2022   EGFR 97 11/06/2022   CALCIUM 10.1 11/06/2022   PHOS 3.6 10/14/2022   PROT 7.0 10/12/2022   ALBUMIN 4.2 10/12/2022   LABGLOB 2.2 03/07/2022   AGRATIO 1.9 03/07/2022   BILITOT 0.5 10/12/2022   ALKPHOS 44 10/12/2022   AST 17 10/12/2022   ALT 11 10/12/2022   ANIONGAP 11 10/15/2022   Last lipids Lab Results  Component Value Date   CHOL 85 10/14/2022   HDL 35 (L) 10/14/2022   LDLCALC 8 10/14/2022   TRIG 212 (H) 10/14/2022   CHOLHDL 2.4 10/14/2022   Last hemoglobin A1c Lab Results  Component Value Date   HGBA1C 8.6 (H) 10/13/2022   Last thyroid functions Lab Results  Component Value Date   TSH 1.790 10/18/2013     Assessment & Plan:   Problem List Items Addressed This Visit       DM type 2 causing vascular disease (HCC) - Primary    A1c 8.6 on labs from August.  He is currently prescribed Ozempic 0.5 mg weekly, Lantus 24 units nightly, NovoLog 14 units 3 times daily with meals, and metformin 1000 mg twice daily. 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  Ultimately found to have had a focal seizure with Todd's paralysis.  Suspected late effect of stroke.  Discharged on Depakote.  Denies additional seizure activity.  He has completed DAPT x 3 weeks and remains on ASA 81 mg daily.  Seen by neurology for follow-up last month.  Reports that he has been out of Depakote x 2 days but refilled his prescription yesterday. -No medication changes today.  Driving precautions reviewed -Neurology follow-up scheduled for April 2025 -Check Depakote level      Need for influenza vaccination    Influenza vaccine administered today      Return in about 3 months (around 03/19/2023).   [name], MD

## 2022-12-17 NOTE — Patient Instructions (Signed)
It was a pleasure to see you today.  Thank you for giving [name] the opportunity to be involved in your care.  Below is a brief recap of your visit and next steps.  We will plan to see you again in 3 months.  Summary Increase Ozempic to 1 mg weekly Flu shot today Follow up in 3 months

## 2022-12-18 ENCOUNTER — Ambulatory Visit (HOSPITAL_COMMUNITY): Payer: Medicare PPO

## 2022-12-18 ENCOUNTER — Ambulatory Visit: Payer: Medicare PPO | Admitting: Vascular Surgery

## 2022-12-19 ENCOUNTER — Ambulatory Visit (HOSPITAL_COMMUNITY): Payer: Medicare PPO | Admitting: Physical Therapy

## 2022-12-20 DIAGNOSIS — E119 Type 2 diabetes mellitus without complications: Secondary | ICD-10-CM | POA: Diagnosis not present

## 2022-12-20 DIAGNOSIS — H04123 Dry eye syndrome of bilateral lacrimal glands: Secondary | ICD-10-CM | POA: Diagnosis not present

## 2022-12-20 DIAGNOSIS — H524 Presbyopia: Secondary | ICD-10-CM | POA: Diagnosis not present

## 2022-12-20 DIAGNOSIS — H35033 Hypertensive retinopathy, bilateral: Secondary | ICD-10-CM | POA: Diagnosis not present

## 2022-12-27 DIAGNOSIS — E1159 Type 2 diabetes mellitus with other circulatory complications: Secondary | ICD-10-CM | POA: Diagnosis not present

## 2023-01-01 ENCOUNTER — Other Ambulatory Visit: Payer: Self-pay | Admitting: Cardiology

## 2023-01-01 ENCOUNTER — Other Ambulatory Visit: Payer: Self-pay | Admitting: Internal Medicine

## 2023-01-02 ENCOUNTER — Ambulatory Visit: Payer: Medicare PPO | Attending: Internal Medicine | Admitting: Nurse Practitioner

## 2023-01-02 ENCOUNTER — Encounter: Payer: Self-pay | Admitting: Nurse Practitioner

## 2023-01-02 VITALS — BP 106/60 | HR 94 | Ht 67.0 in | Wt 133.0 lb

## 2023-01-02 DIAGNOSIS — I6522 Occlusion and stenosis of left carotid artery: Secondary | ICD-10-CM

## 2023-01-02 DIAGNOSIS — Z720 Tobacco use: Secondary | ICD-10-CM | POA: Diagnosis not present

## 2023-01-02 DIAGNOSIS — I739 Peripheral vascular disease, unspecified: Secondary | ICD-10-CM | POA: Diagnosis not present

## 2023-01-02 DIAGNOSIS — E781 Pure hyperglyceridemia: Secondary | ICD-10-CM | POA: Diagnosis not present

## 2023-01-02 DIAGNOSIS — I2510 Atherosclerotic heart disease of native coronary artery without angina pectoris: Secondary | ICD-10-CM | POA: Diagnosis not present

## 2023-01-02 DIAGNOSIS — Z87898 Personal history of other specified conditions: Secondary | ICD-10-CM | POA: Diagnosis not present

## 2023-01-02 DIAGNOSIS — Z8673 Personal history of transient ischemic attack (TIA), and cerebral infarction without residual deficits: Secondary | ICD-10-CM | POA: Insufficient documentation

## 2023-01-02 DIAGNOSIS — I10 Essential (primary) hypertension: Secondary | ICD-10-CM | POA: Diagnosis not present

## 2023-01-02 DIAGNOSIS — I214 Non-ST elevation (NSTEMI) myocardial infarction: Secondary | ICD-10-CM

## 2023-01-02 DIAGNOSIS — Z79899 Other long term (current) drug therapy: Secondary | ICD-10-CM | POA: Diagnosis not present

## 2023-01-02 DIAGNOSIS — E785 Hyperlipidemia, unspecified: Secondary | ICD-10-CM | POA: Insufficient documentation

## 2023-01-02 DIAGNOSIS — E782 Mixed hyperlipidemia: Secondary | ICD-10-CM

## 2023-01-02 DIAGNOSIS — R0602 Shortness of breath: Secondary | ICD-10-CM

## 2023-01-02 MED ORDER — FENOFIBRATE 160 MG PO TABS: 160.0000 mg | ORAL_TABLET | Freq: Every day | ORAL | 1 refills | Status: DC

## 2023-01-02 NOTE — Patient Instructions (Addendum)
Medication Instructions:  Your physician has recommended you make the following change in your medication:  Please Increase fenofibrate to 160 MG tablet daily   Labwork: In 2 months at [facility]   Testing/Procedures: None   Follow-Up: Your physician recommends that you schedule a follow-up appointment in: 6 Months   Any Other Special Instructions Will Be Listed Below (If Applicable).  If you need a refill on your cardiac medications before your next appointment, please call your pharmacy.

## 2023-01-02 NOTE — Progress Notes (Signed)
Cardiology Office Note:   Date: 01/02/2023 ID:  Jimmy Duran, DOB [birth year], MRN [id number] PCP:  [name], MD [facility] Providers Cardiologist:  [name], MD    Referring MD: [name], MD  CC: Here for follow-up  History of Present Illness:    Jimmy Duran is a 67 y.o. male with a PMH of CAD, carotid stenosis, s/p R CEA (2021) and left TCAR -8/23 (followed by [facility]), HTN, T2DM, hx of multiple TIA's/CVA, COPD, GERD, palpitations, dizziness, hx of seizure, and IDA, who presents today for scheduled follow-up.   Previous cardiovascular history includes nonobstructive CAD and low risk stress test in 2015  In 2021 he underwent right CEA, and underwent previous stenting to superior mesenteric artery in 2022.  Admitted to [facility] in August 2023 for arranged left TCAR, no immediate complications.  Presented back to [name] on October 27, 2021 for worsening dizziness x 3 weeks.  Received IV fluids due to poor p.o. intake.  Lopressor was reduced and was recommended to follow-up with cardiology outpatient.  Seen by [name], PA-C on November 22, 2021 for hospital follow-up. Pt noted worsening lightheadedness x 1 month, not triggered with certain movements.  Said sometimes his legs feel weak and experienced occasional falls.  Noted unintentional 40 pound weight loss over the past year. Orthostatics revealed SBP dropped by 10 points. Recommended to stop hydrochlorothiazide and was given Rx for lisinopril 20 mg.    02/21/2022 - I first saw him for 3 month follow up.  Noted lightheaded since carotid surgery intermittently, denied any vertigo symptoms. Was off lisinopril for a week for hypotension.  His symptoms improved significantly after stopping lisinopril.  Blood pressure averaging 117/68.  Did admit to occasional palpitations, nonsustained, not bothersome per his report. Denied chest pain. 14 day Zio and Echo were ordered. Labs were arranged.    Admitted with NSTEMI at [facility] on 02/23/2022. EKG showed mild ST with minimal ST depression in lateral leads < 1 mm. Serial trops 28 -> 150 (peak) in setting of demand ischemia due to anemia and heme positive stool. Received blood transfusions. Plavix held d/t hx of anemia/ GI bleed. Echo updated in hospital and revealed normal LV function. GI workup was favored before proceeding with invasive cardiac testing. ASA was continued and was instructed not to restart Plavix. Pt left AMA on 02/25/2022. Was told to follow up with GI and cardiology. Underwent upper endoscopy and colonoscopy on 03/06/2022. Upper endoscopy showed hiatal hernia, but was otherwise normal.  Colonoscopy revealed 2 polyps, 5 and 7 mm in size that were removed, otherwise was unremarkable.   03/11/2022 - Saw him for follow-up. GI doctor was arranging capsule endoscopy. Was feeling better after receiving the blood transfusion. Plavix was held. Was smoking 2 PPD, very motivated to quick smoking. Cardiac monitor revealed SR, PAC's < 1% burden. Brief run of SVT, not sustained. Rare PVC's < 1% total beats, had 4 beat run of NSVT vs aberrant SVT, no sustained pauses or arrhthymias. 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 Few non-bleeding erosions noted along ileocecal valve without masses, no active bleeding noted. Was contacted by GI PA-C that he was okay to resume ASA and Plavix.   04/11/2022 - Seen for follow-up. Was doing well and reported feeling a little better since last visit. Denied any chest pain, shortness of breath, palpitations, syncope, presyncope, dizziness, orthopnea, PND, swelling or significant weight changes, acute bleeding, or claudication. Wanted to increase activity.  Still continued to smoke.    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 Presented to [facility] ED for evaluation of sudden onset of right sided weakness and aphasia.  Was intubated for severe agitation to receive TNK and was  transferred to [facility] for further treatment.  MRI negative for new stroke, evidence of chronic stroke and superior parietal lobe that could be source for seizures.  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  Started on Depakote, was told to follow-up with neurology.  Given his stroke concerns, patient would continue DAPT therapy for 3 weeks, then aspirin 81 mg daily for life.  11/12/2022 - Doing better from when I last saw him in office. Chief concern is his elevated BP. Says he hasn't had readings this elevated in a long time. Says he can sense that his BP is elevated, says SBP readings at home average 150s -160's. Denies any chest pain, shortness of breath, palpitations, syncope, presyncope, orthopnea, PND, swelling or significant weight changes, acute bleeding, or claudication. Continues to note dizziness with certain head movements, denies vertigo symptoms.   11/20/2022 - Presented for telephone visit for HTN follow-up. BP log reviewed with patient's wife and SBP averaging 160's-180's. Lowest BP reading 148/82. Compliant with his medications. Pt does report some cough/congestion and feeling under the weather, going to see his PCP regarding this. Does have some shortness of breath related to his current illness. Denies any stroke like symptoms. Denies any anginal chest pain, palpitations, syncope, presyncope, dizziness, orthopnea, PND, swelling or significant weight changes, acute bleeding, or claudication.  01/02/2023 - Here for follow-up. BP is much better controlled and at goal. He is doing well. Denies any chest pain, shortness of breath, palpitations, syncope, presyncope, dizziness, orthopnea, PND, swelling or significant weight changes, acute bleeding, or claudication.   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 Loves to do yard work.   Please see the history  of present illness.    All other systems reviewed and are negative.  EKGs/Labs/Other Studies Reviewed:    The following studies were reviewed today:   EKG:  EKG is not ordered today.   Carotid duplex 10/2022:  Summary:  Right Carotid: Velocities in the right ICA are consistent with a 1-39%  stenosis. Non-hemodynamically significant plaque <50% noted in the CCA.   Left Carotid: Non-hemodynamically significant plaque <50% noted in the CCA. The ECA appears >50% stenosed. Patent left ICA stent. Focal, calcific plaque at the mid segments with 50-75% stenosis, lower end of range.   Vertebrals:  Bilateral vertebral arteries demonstrate antegrade flow.  Subclavians: Normal flow hemodynamics were seen in bilateral subclavian arteries.  Echo 10/2022: 1. Left ventricular ejection fraction, by estimation, is 55 to 60%. The  left ventricle has normal function. The left ventricle has no regional  wall motion abnormalities. There is moderate concentric left ventricular hypertrophy. Left ventricular diastolic parameters are consistent with Grade I diastolic dysfunction (impaired relaxation).   2. Right ventricular systolic function is normal. The right ventricular  size is normal.   3. The mitral valve is normal in structure. No evidence of mitral valve regurgitation. No evidence of mitral stenosis.   4. The aortic valve was not well visualized. Aortic valve regurgitation  is trivial. No aortic stenosis is present.   5. The inferior vena cava is normal in size with greater than 50%  respiratory variability, suggesting right  atrial pressure of 3 mmHg.   6. Agitated saline contrast bubble study was negative, with no evidence of any interatrial shunt.   Comparison(s): No significant change from prior study. Prior images  reviewed side by side.  Cardiac monitor on 03/14/2022: Predominant rhythm is sinus with heart rate ranging from 67 bpm up to 161 bpm and average heart rate 90 bpm. There were rare  PACs including atrial couplets representing less than 1% total beats.  There was a single brief run of SVT lasting 5 beats. There were rare PVCs representing less than 1% total beats.  Four beat run of NSVT versus aberrant SVT was noted. No sustained arrhythmias or pauses.  Echocardiogram on 02/24/2022: 1. Left ventricular ejection fraction, by estimation, is 55 to 60%. The  left ventricle has normal function. The left ventricle has no regional  wall motion abnormalities. Left ventricular diastolic parameters are  indeterminate.   2. Right ventricular systolic function is normal. The right ventricular  size is normal. Tricuspid regurgitation signal is inadequate for assessing  PA pressure.   3. The mitral valve is grossly normal. Trivial mitral valve  regurgitation. No evidence of mitral stenosis.   4. The aortic valve is tricuspid. Aortic valve regurgitation is mild. No  aortic stenosis is present.   5. The inferior vena cava is normal in size with greater than 50%  respiratory variability, suggesting right atrial pressure of 3 mmHg.   Conclusion(s)/Recommendation(s): Normal biventricular function without  evidence of hemodynamically significant valvular heart disease.  Cardiac monitor 02/2022: Predominant rhythm is sinus with heart rate ranging from 67 bpm up to 161 bpm and average heart rate 90 bpm. There were rare PACs including atrial couplets representing less than 1% total beats.  There was a single brief run of SVT lasting 5 beats. There were rare PVCs representing less than 1% total beats.  Four beat run of NSVT versus aberrant SVT was noted. No sustained arrhythmias or pauses.   Bilateral carotid duplex on 11/14/2021: Summary:  Right Carotid: Velocities in the right ICA are consistent with a 1-39%  stenosis.   Left Carotid: Patent left ICA stent with no evidence of restenosis.   Vertebrals:  Bilateral vertebral arteries demonstrate antegrade flow.  Subclavians: Normal  flow hemodynamics were seen in bilateral subclavian               arteries.   Mesenteric limited on 08/21/2020: Summary:  Mesenteric:  70 to 99% stenosis in the superior mesenteric artery.  Unable to determine distal end of stent. Stenosis is either at the distal  end of  or distal to the stent.     Vascular US aorta/ IVC/ iliacs doppler on 09/28/2019: Summary:  Abdominal Aorta: The largest aortic measurement is 2.4 cm. Unable to  identify dissection of infrarenal aorta due to bowel gas.  The left common iliac artery measures approximately 2.25 cm x 2.33 cm,  based on limited visualization due to overlying bowel gas.  2D echocardiogram on 12/11/2018: 1. Left ventricular ejection fraction, by visual estimation, is 60 to  65%. The left ventricle has normal function. There is moderately increased  left ventricular hypertrophy.   2. Left ventricular diastolic Doppler parameters are consistent with  impaired relaxation pattern of LV diastolic filling.   3. Global right ventricle has low normal systolic function.The right  ventricular size is normal. No increase in right ventricular wall  thickness.   4. Left atrial size was normal.   5. Right atrial size was normal.  6. Mild mitral annular calcification.   7. Mild to moderate aortic valve annular calcification.   8. The mitral valve is degenerative. No evidence of mitral valve  regurgitation.   9. The tricuspid valve is grossly normal. Tricuspid valve regurgitation  was not visualized by color flow Doppler.  10. The aortic valve is tricuspid Aortic valve regurgitation was not  visualized by color flow Doppler. Mild aortic valve sclerosis without  stenosis.  11. The pulmonic valve was not well visualized. Pulmonic valve  regurgitation is not visualized by color flow Doppler.  12. The inferior vena cava is normal in size with greater than 50%  respiratory variability, suggesting right atrial pressure of 3 mmHg.   Myoview on  04/23/2013: IMPRESSION:  1. Negative Lexi scan myocardial perfusion imaging stress test   2. Normal left ventricular ejection fraction with normal wall  motion.   3. Low risk study for major cardiovascular events.   Physical Exam:    VS:  BP 106/60   Pulse 94   Ht 5\' 7"  (1.702 m)   Wt 133 lb (60.3 kg)   SpO2 97%   BMI 20.83 kg/m     Wt Readings from Last 3 Encounters:  01/02/23 133 lb (60.3 kg)  12/17/22 132 lb 6.4 oz (60.1 kg)  11/27/22 129 lb 9.6 oz (58.8 kg)    Vitals:   01/02/23 1115  BP: 106/60     GEN: Thin, 67 y.o. male in no acute distress HEENT: Normal NECK: No JVD; No carotid bruits on exam CARDIAC: S1/S2, RRR, Grade 2/6 murmur noted on exam, no rubs, gallops; 2+ pulses RESPIRATORY:  Clear and diminished to auscultation without rales, wheezing or rhonchi  MUSCULOSKELETAL:  No edema; No deformity  SKIN: Warm and dry NEUROLOGIC:  Alert and oriented x 3 PSYCHIATRIC:  Normal affect   ASSESSMENT & PLAN:    In order of problems listed above:  HTN BP at goal. SBP < 140. Discussed to monitor BP at home at least 2 hours after medications and sitting for 5-10 minutes.  Continue current medication regimen. Heart healthy diet and regular cardiovascular exercise encouraged. Will cancel referral to HTN clinic.   CAD, s/p NSTEMI   Stable with no anginal symptoms. No indication for ischemic evaluation at this time. Continue current medication regimen. Heart healthy diet and regular cardiovascular exercise encouraged. Care and ED precautions discussed.  PAD He is s/p right CEA in 2021, stenting to SMA in 2022, as well as TCAR (L) in 10/2021. Continue current medication regimen. Follow-up with [facility]. Heart healthy diet encouraged.   HLD, hypertriglyceridemia, medication management Labs from 10/2022 revealed total cholesterol 85, HDL 35, LDL 8, and triglycerides 212.  Continue current medication regimen. For his chronically elevated TG, will increase Fenofibrate to 160 mg daily  and repeat FLP/LFT in 2 months per protocol. Heart healthy diet encouraged.   5. Hx of TIA's/CVA, hx of seizures Hx of multiple TIA's, recent MRI revealed no acute intracranial abnormality, chronic small vessel ischemia, hemosiderin disposition at the superior left parietal lobe, multifocal hyperintense T2-weighted signal within the white matter.  Chronic stroke evident along superior parietal lobe, felt to be the source of his seizures.  Completed Plavix therapy per his report. Continue Aspirin as instructed as d/c. Care and ED precautions discussed. Continue to follow with PCP and Neurology.   7. Tobacco abuse  Smoking cessation encouraged and discussed.   8. Disposition: Follow-up with Dr. Diona Browner or [facility] in 6 months or sooner if anything changes.  Medication Adjustments/Labs and Tests Ordered: Current medicines are reviewed at length with the patient today.  Concerns regarding medicines are outlined above.  No orders of the defined types were placed in this encounter.  No orders of the defined types were placed in this encounter.   Patient Instructions  Medication Instructions:    Labwork:   Testing/Procedures:   Follow-Up: Your physician recommends that you schedule a follow-up appointment in:   Any Other Special Instructions Will Be Listed Below (If Applicable).  If you need a refill on your cardiac medications before your next appointment, please call your pharmacy.   Signed, Sharlene Dory, NP

## 2023-01-15 ENCOUNTER — Ambulatory Visit (HOSPITAL_COMMUNITY): Payer: Medicare PPO | Attending: Vascular Surgery

## 2023-01-15 ENCOUNTER — Ambulatory Visit: Payer: Medicare PPO | Admitting: Vascular Surgery

## 2023-01-15 ENCOUNTER — Encounter (HOSPITAL_COMMUNITY): Payer: Self-pay

## 2023-01-16 ENCOUNTER — Telehealth: Payer: Self-pay | Admitting: Cardiology

## 2023-01-16 MED ORDER — LISINOPRIL 20 MG PO TABS: 20.0000 mg | ORAL_TABLET | Freq: Two times a day (BID) | ORAL | 2 refills | Status: DC

## 2023-01-16 NOTE — Telephone Encounter (Signed)
Filled

## 2023-01-16 NOTE — Telephone Encounter (Signed)
*  STAT* If patient is at the pharmacy, call can be transferred to refill team.   1. Which medications need to be refilled? (please list name of each medication and dose if known)   lisinopril (ZESTRIL) 20 MG tablet   2. Would you like to learn more about the convenience, safety, & potential cost savings by using the [facility]?   3. Are you open to using the [facility] (Type [facility]. ).  4. Which pharmacy/location (including street and city if local pharmacy) is medication to be sent to?  [facility] [address], [place] - [address]   5. Do they need a 30 day or 90 day supply?  90 day  Wife stated patient has 3 days left of this medication.

## 2023-01-21 ENCOUNTER — Other Ambulatory Visit: Payer: Self-pay

## 2023-01-21 DIAGNOSIS — I69398 Other sequelae of cerebral infarction: Secondary | ICD-10-CM | POA: Insufficient documentation

## 2023-01-21 DIAGNOSIS — I10 Essential (primary) hypertension: Secondary | ICD-10-CM | POA: Diagnosis not present

## 2023-01-21 NOTE — Assessment & Plan Note (Signed)
Influenza vaccine administered today.

## 2023-01-21 NOTE — Patient Outreach (Signed)
Telephone outreach to patient to obtain mRS was successfully completed. MRS= 1  Vanice Sarah [facility] Care Management Assistant [phone]

## 2023-01-21 NOTE — Assessment & Plan Note (Signed)
Recent hospital admission as otherwise documented 8/10 - 8/13 for acute onset right-sided weakness and aphasia.  Ultimately found to have had a focal seizure with Todd's paralysis.  Suspected late effect of stroke.  Discharged on Depakote.  Denies additional seizure activity.  He has completed DAPT x 3 weeks and remains on ASA 81 mg daily.  Seen by neurology for follow-up last month.  Reports that he has been out of Depakote x 2 days but refilled his prescription yesterday. -No medication changes today.  Driving precautions reviewed -Neurology follow-up scheduled for April 2025 -Check Depakote level

## 2023-01-21 NOTE — Assessment & Plan Note (Signed)
A1c 8.6 on labs from August.  He is currently prescribed Ozempic 0.5 mg weekly, Lantus 24 units nightly, NovoLog 14 units 3 times daily with meals, and metformin 1000 mg twice daily. -Increase Ozempic to 1 mg weekly -Repeat A1c at follow-up in 3 months

## 2023-01-22 ENCOUNTER — Other Ambulatory Visit: Payer: Self-pay

## 2023-01-22 ENCOUNTER — Other Ambulatory Visit: Payer: Self-pay | Admitting: Internal Medicine

## 2023-01-22 DIAGNOSIS — R569 Unspecified convulsions: Secondary | ICD-10-CM

## 2023-01-22 DIAGNOSIS — I69398 Other sequelae of cerebral infarction: Secondary | ICD-10-CM

## 2023-01-22 LAB — BASIC METABOLIC PANEL
BUN/Creatinine Ratio: 11 (ref 10–24)
BUN: 12 mg/dL (ref 8–27)
CO2: 23 mmol/L (ref 20–29)
Calcium: 9.3 mg/dL (ref 8.6–10.2)
Chloride: 103 mmol/L (ref 96–106)
Creatinine, Ser: 1.05 mg/dL (ref 0.76–1.27)
Glucose: 226 mg/dL — ABNORMAL HIGH (ref 70–99)
Potassium: 4.1 mmol/L (ref 3.5–5.2)
Sodium: 140 mmol/L (ref 134–144)
eGFR: 79 mL/min/{1.73_m2} (ref >59)

## 2023-01-27 ENCOUNTER — Other Ambulatory Visit: Payer: Self-pay | Admitting: Internal Medicine

## 2023-02-10 ENCOUNTER — Other Ambulatory Visit: Payer: Self-pay | Admitting: Internal Medicine

## 2023-02-10 ENCOUNTER — Encounter: Payer: Self-pay | Admitting: Internal Medicine

## 2023-02-10 MED ORDER — BENZONATATE 200 MG PO CAPS: 200.0000 mg | ORAL_CAPSULE | Freq: Two times a day (BID) | ORAL | 0 refills | Status: DC | PRN

## 2023-02-11 ENCOUNTER — Inpatient Hospital Stay (HOSPITAL_COMMUNITY)
Admission: EM | Admit: 2023-02-11 | Discharge: 2023-02-15 | DRG: 208 | Disposition: A | Discharge status: Home / Self Care | Payer: Medicare PPO | Attending: Internal Medicine | Admitting: Internal Medicine

## 2023-02-11 ENCOUNTER — Other Ambulatory Visit: Payer: Self-pay

## 2023-02-11 ENCOUNTER — Encounter (HOSPITAL_COMMUNITY): Payer: Self-pay | Admitting: *Deleted

## 2023-02-11 ENCOUNTER — Emergency Department (HOSPITAL_COMMUNITY): Payer: Medicare PPO

## 2023-02-11 ENCOUNTER — Inpatient Hospital Stay (HOSPITAL_COMMUNITY): Payer: Medicare PPO

## 2023-02-11 DIAGNOSIS — I10 Essential (primary) hypertension: Secondary | ICD-10-CM | POA: Diagnosis not present

## 2023-02-11 DIAGNOSIS — J1000 Influenza due to other identified influenza virus with unspecified type of pneumonia: Secondary | ICD-10-CM | POA: Diagnosis not present

## 2023-02-11 DIAGNOSIS — I69398 Other sequelae of cerebral infarction: Secondary | ICD-10-CM | POA: Diagnosis not present

## 2023-02-11 DIAGNOSIS — E782 Mixed hyperlipidemia: Secondary | ICD-10-CM | POA: Diagnosis present

## 2023-02-11 DIAGNOSIS — J440 Chronic obstructive pulmonary disease with acute lower respiratory infection: Secondary | ICD-10-CM | POA: Diagnosis not present

## 2023-02-11 DIAGNOSIS — R569 Unspecified convulsions: Secondary | ICD-10-CM

## 2023-02-11 DIAGNOSIS — E1165 Type 2 diabetes mellitus with hyperglycemia: Secondary | ICD-10-CM | POA: Diagnosis not present

## 2023-02-11 DIAGNOSIS — F419 Anxiety disorder, unspecified: Secondary | ICD-10-CM | POA: Diagnosis present

## 2023-02-11 DIAGNOSIS — E876 Hypokalemia: Secondary | ICD-10-CM | POA: Diagnosis not present

## 2023-02-11 DIAGNOSIS — I2510 Atherosclerotic heart disease of native coronary artery without angina pectoris: Secondary | ICD-10-CM | POA: Diagnosis present

## 2023-02-11 DIAGNOSIS — Z833 Family history of diabetes mellitus: Secondary | ICD-10-CM

## 2023-02-11 DIAGNOSIS — Z1152 Encounter for screening for COVID-19: Secondary | ICD-10-CM | POA: Diagnosis not present

## 2023-02-11 DIAGNOSIS — F17210 Nicotine dependence, cigarettes, uncomplicated: Secondary | ICD-10-CM | POA: Diagnosis present

## 2023-02-11 DIAGNOSIS — J441 Chronic obstructive pulmonary disease with (acute) exacerbation: Secondary | ICD-10-CM | POA: Diagnosis present

## 2023-02-11 DIAGNOSIS — R0602 Shortness of breath: Secondary | ICD-10-CM | POA: Diagnosis not present

## 2023-02-11 DIAGNOSIS — K219 Gastro-esophageal reflux disease without esophagitis: Secondary | ICD-10-CM | POA: Diagnosis present

## 2023-02-11 DIAGNOSIS — F411 Generalized anxiety disorder: Secondary | ICD-10-CM | POA: Diagnosis present

## 2023-02-11 DIAGNOSIS — I252 Old myocardial infarction: Secondary | ICD-10-CM | POA: Diagnosis not present

## 2023-02-11 DIAGNOSIS — Z794 Long term (current) use of insulin: Secondary | ICD-10-CM | POA: Diagnosis not present

## 2023-02-11 DIAGNOSIS — Z4682 Encounter for fitting and adjustment of non-vascular catheter: Secondary | ICD-10-CM | POA: Diagnosis not present

## 2023-02-11 DIAGNOSIS — Z96652 Presence of left artificial knee joint: Secondary | ICD-10-CM | POA: Diagnosis present

## 2023-02-11 DIAGNOSIS — E1159 Type 2 diabetes mellitus with other circulatory complications: Secondary | ICD-10-CM

## 2023-02-11 DIAGNOSIS — Z7982 Long term (current) use of aspirin: Secondary | ICD-10-CM

## 2023-02-11 DIAGNOSIS — Z825 Family history of asthma and other chronic lower respiratory diseases: Secondary | ICD-10-CM

## 2023-02-11 DIAGNOSIS — J9690 Respiratory failure, unspecified, unspecified whether with hypoxia or hypercapnia: Secondary | ICD-10-CM | POA: Diagnosis present

## 2023-02-11 DIAGNOSIS — E872 Acidosis, unspecified: Secondary | ICD-10-CM | POA: Diagnosis present

## 2023-02-11 DIAGNOSIS — Z79899 Other long term (current) drug therapy: Secondary | ICD-10-CM | POA: Diagnosis not present

## 2023-02-11 DIAGNOSIS — J9602 Acute respiratory failure with hypercapnia: Secondary | ICD-10-CM | POA: Diagnosis not present

## 2023-02-11 DIAGNOSIS — J449 Chronic obstructive pulmonary disease, unspecified: Secondary | ICD-10-CM | POA: Diagnosis present

## 2023-02-11 DIAGNOSIS — J9601 Acute respiratory failure with hypoxia: Secondary | ICD-10-CM | POA: Diagnosis present

## 2023-02-11 DIAGNOSIS — G40909 Epilepsy, unspecified, not intractable, without status epilepticus: Secondary | ICD-10-CM | POA: Diagnosis not present

## 2023-02-11 DIAGNOSIS — I700 Atherosclerosis of aorta: Secondary | ICD-10-CM | POA: Diagnosis not present

## 2023-02-11 DIAGNOSIS — E1151 Type 2 diabetes mellitus with diabetic peripheral angiopathy without gangrene: Secondary | ICD-10-CM | POA: Diagnosis present

## 2023-02-11 DIAGNOSIS — J101 Influenza due to other identified influenza virus with other respiratory manifestations: Secondary | ICD-10-CM | POA: Diagnosis not present

## 2023-02-11 DIAGNOSIS — Z8249 Family history of ischemic heart disease and other diseases of the circulatory system: Secondary | ICD-10-CM

## 2023-02-11 DIAGNOSIS — Z7985 Long-term (current) use of injectable non-insulin antidiabetic drugs: Secondary | ICD-10-CM

## 2023-02-11 DIAGNOSIS — Z8679 Personal history of other diseases of the circulatory system: Secondary | ICD-10-CM

## 2023-02-11 DIAGNOSIS — Z8262 Family history of osteoporosis: Secondary | ICD-10-CM

## 2023-02-11 DIAGNOSIS — Z7984 Long term (current) use of oral hypoglycemic drugs: Secondary | ICD-10-CM

## 2023-02-11 DIAGNOSIS — J9600 Acute respiratory failure, unspecified whether with hypoxia or hypercapnia: Secondary | ICD-10-CM | POA: Diagnosis not present

## 2023-02-11 DIAGNOSIS — I714 Abdominal aortic aneurysm, without rupture, unspecified: Secondary | ICD-10-CM | POA: Diagnosis present

## 2023-02-11 LAB — CBC WITH DIFFERENTIAL/PLATELET
Abs Immature Granulocytes: 0.04 10*3/uL (ref 0.00–0.07)
Basophils Absolute: 0.1 10*3/uL (ref 0.0–0.1)
Basophils Relative: 1 %
Eosinophils Absolute: 0.1 10*3/uL (ref 0.0–0.5)
Eosinophils Relative: 1 %
HCT: 41.8 % (ref 39.0–52.0)
Hemoglobin: 13.8 g/dL (ref 13.0–17.0)
Immature Granulocytes: 1 %
Lymphocytes Relative: 6 %
Lymphs Abs: 0.4 10*3/uL — ABNORMAL LOW (ref 0.7–4.0)
MCH: 31.7 pg (ref 26.0–34.0)
MCHC: 33 g/dL (ref 30.0–36.0)
MCV: 96.1 fL (ref 80.0–100.0)
Monocytes Absolute: 0.8 10*3/uL (ref 0.1–1.0)
Monocytes Relative: 11 %
Neutro Abs: 6 10*3/uL (ref 1.7–7.7)
Neutrophils Relative %: 80 %
Platelets: 203 10*3/uL (ref 150–400)
RBC: 4.35 MIL/uL (ref 4.22–5.81)
RDW: 13.4 % (ref 11.5–15.5)
WBC: 7.4 10*3/uL (ref 4.0–10.5)
nRBC: 0 % (ref 0.0–0.2)

## 2023-02-11 LAB — GLUCOSE, CAPILLARY
Glucose-Capillary: 202 mg/dL — ABNORMAL HIGH (ref 70–99)
Glucose-Capillary: 207 mg/dL — ABNORMAL HIGH (ref 70–99)

## 2023-02-11 LAB — POCT I-STAT 7, (LYTES, BLD GAS, ICA,H+H)
Acid-base deficit: 3 mmol/L — ABNORMAL HIGH (ref 0.0–2.0)
Bicarbonate: 23.7 mmol/L (ref 20.0–28.0)
Calcium, Ion: 1.16 mmol/L (ref 1.15–1.40)
HCT: 38 % — ABNORMAL LOW (ref 39.0–52.0)
Hemoglobin: 12.9 g/dL — ABNORMAL LOW (ref 13.0–17.0)
O2 Saturation: 100 %
Potassium: 4.4 mmol/L (ref 3.5–5.1)
Sodium: 137 mmol/L (ref 135–145)
TCO2: 25 mmol/L (ref 22–32)
pCO2 arterial: 47.3 mm[Hg] (ref 32–48)
pH, Arterial: 7.307 — ABNORMAL LOW (ref 7.35–7.45)
pO2, Arterial: 283 mm[Hg] — ABNORMAL HIGH (ref 83–108)

## 2023-02-11 LAB — HEMOGLOBIN A1C
Hgb A1c MFr Bld: 8 % — ABNORMAL HIGH (ref 4.8–5.6)
Mean Plasma Glucose: 182.9 mg/dL

## 2023-02-11 LAB — HEPATIC FUNCTION PANEL
ALT: 16 U/L (ref 0–44)
AST: 34 U/L (ref 15–41)
Albumin: 3.5 g/dL (ref 3.5–5.0)
Alkaline Phosphatase: 37 U/L — ABNORMAL LOW (ref 38–126)
Bilirubin, Direct: 0.2 mg/dL (ref 0.0–0.2)
Indirect Bilirubin: 0.3 mg/dL (ref 0.3–0.9)
Total Bilirubin: 0.5 mg/dL (ref <1.2)
Total Protein: 7.1 g/dL (ref 6.5–8.1)

## 2023-02-11 LAB — BASIC METABOLIC PANEL
Anion gap: 11 (ref 5–15)
BUN: 23 mg/dL (ref 8–23)
CO2: 26 mmol/L (ref 22–32)
Calcium: 9 mg/dL (ref 8.9–10.3)
Chloride: 98 mmol/L (ref 98–111)
Creatinine, Ser: 0.96 mg/dL (ref 0.61–1.24)
GFR, Estimated: >60 mL/min (ref >60)
Glucose, Bld: 191 mg/dL — ABNORMAL HIGH (ref 70–99)
Potassium: 4.1 mmol/L (ref 3.5–5.1)
Sodium: 135 mmol/L (ref 135–145)

## 2023-02-11 LAB — BLOOD GAS, ARTERIAL
Acid-base deficit: 4.7 mmol/L — ABNORMAL HIGH (ref 0.0–2.0)
Bicarbonate: 24.2 mmol/L (ref 20.0–28.0)
Drawn by: 27016
FIO2: 21 %
O2 Saturation: 99.3 %
Patient temperature: 37
pCO2 arterial: 62 mm[Hg] — ABNORMAL HIGH (ref 32–48)
pH, Arterial: 7.2 — ABNORMAL LOW (ref 7.35–7.45)
pO2, Arterial: 174 mm[Hg] — ABNORMAL HIGH (ref 83–108)

## 2023-02-11 LAB — PROCALCITONIN: Procalcitonin: 0.19 ng/mL

## 2023-02-11 LAB — BLOOD GAS, VENOUS
Acid-base deficit: 2 mmol/L (ref 0.0–2.0)
Bicarbonate: 31.7 mmol/L — ABNORMAL HIGH (ref 20.0–28.0)
Drawn by: 66297
O2 Saturation: 43.5 %
Patient temperature: 37
pCO2, Ven: 107 mm[Hg] (ref 44–60)
pH, Ven: 7.08 — CL (ref 7.25–7.43)
pO2, Ven: 34 mm[Hg] (ref 32–45)

## 2023-02-11 LAB — RESP PANEL BY RT-PCR (RSV, FLU A&B, COVID)  RVPGX2
Influenza A by PCR: POSITIVE — AB
Influenza B by PCR: NEGATIVE
Resp Syncytial Virus by PCR: NEGATIVE
SARS Coronavirus 2 by RT PCR: NEGATIVE

## 2023-02-11 LAB — PHOSPHORUS: Phosphorus: 3.4 mg/dL (ref 2.5–4.6)

## 2023-02-11 LAB — BRAIN NATRIURETIC PEPTIDE: B Natriuretic Peptide: 294.2 pg/mL — ABNORMAL HIGH (ref 0.0–100.0)

## 2023-02-11 LAB — STREP PNEUMONIAE URINARY ANTIGEN: Strep Pneumo Urinary Antigen: NEGATIVE

## 2023-02-11 LAB — MAGNESIUM: Magnesium: 2.5 mg/dL — ABNORMAL HIGH (ref 1.7–2.4)

## 2023-02-11 LAB — LACTIC ACID, PLASMA: Lactic Acid, Venous: 1.7 mmol/L (ref 0.5–1.9)

## 2023-02-11 MED ORDER — ENOXAPARIN SODIUM 40 MG/0.4ML IJ SOSY
40.0000 mg | PREFILLED_SYRINGE | INTRAMUSCULAR | Status: DC
Start: 2023-02-11 — End: 2023-02-15
  Administered 2023-02-11 – 2023-02-12 (×2): 40 mg via SUBCUTANEOUS
  Filled 2023-02-11 (×2): qty 0.4

## 2023-02-11 MED ORDER — SODIUM CHLORIDE 0.9 % IV BOLUS
500.0000 mL | Freq: Once | INTRAVENOUS | Status: AC
Start: 2023-02-11 — End: 2023-02-11
  Administered 2023-02-11: 500 mL via INTRAVENOUS

## 2023-02-11 MED ORDER — FENTANYL CITRATE PF 50 MCG/ML IJ SOSY
25.0000 ug | PREFILLED_SYRINGE | Freq: Once | INTRAMUSCULAR | Status: AC
  Administered 2023-02-11: 25 ug via INTRAVENOUS

## 2023-02-11 MED ORDER — NOREPINEPHRINE 4 MG/250ML-% IV SOLN
0.0000 ug/min | INTRAVENOUS | Status: DC
  Administered 2023-02-11: 3 ug/min via INTRAVENOUS
  Filled 2023-02-11: qty 250

## 2023-02-11 MED ORDER — MAGNESIUM SULFATE 2 GM/50ML IV SOLN
2.0000 g | Freq: Once | INTRAVENOUS | Status: AC
  Administered 2023-02-11: 2 g via INTRAVENOUS
  Filled 2023-02-11: qty 50

## 2023-02-11 MED ORDER — POLYETHYLENE GLYCOL 3350 17 G PO PACK
17.0000 g | PACK | Freq: Every day | ORAL | Status: DC
  Administered 2023-02-11: 17 g

## 2023-02-11 MED ORDER — ACETAMINOPHEN 325 MG PO TABS
650.0000 mg | ORAL_TABLET | ORAL | Status: DC | PRN
Start: 2023-02-11 — End: 2023-02-12

## 2023-02-11 MED ORDER — PROPOFOL 1000 MG/100ML IV EMUL
INTRAVENOUS | Status: AC
  Administered 2023-02-11: 5 ug/kg/min via INTRAVENOUS
  Filled 2023-02-11: qty 100

## 2023-02-11 MED ORDER — ORAL CARE MOUTH RINSE: 15.0000 mL | OROMUCOSAL | Status: DC | PRN

## 2023-02-11 MED ORDER — DOCUSATE SODIUM 50 MG/5ML PO LIQD
100.0000 mg | Freq: Two times a day (BID) | ORAL | Status: DC
  Administered 2023-02-11: 100 mg

## 2023-02-11 MED ORDER — CHLORHEXIDINE GLUCONATE CLOTH 2 % EX PADS
6.0000 | MEDICATED_PAD | Freq: Every day | CUTANEOUS | Status: DC
  Administered 2023-02-13 – 2023-02-15 (×2): 6 via TOPICAL

## 2023-02-11 MED ORDER — FENTANYL BOLUS VIA INFUSION
25.0000 ug | INTRAVENOUS | Status: DC | PRN
Start: 2023-02-11 — End: 2023-02-12
  Administered 2023-02-11: 50 ug via INTRAVENOUS
  Administered 2023-02-11 (×2): 25 ug via INTRAVENOUS
  Administered 2023-02-12 (×2): 75 ug via INTRAVENOUS
  Administered 2023-02-12: 50 ug via INTRAVENOUS

## 2023-02-11 MED ORDER — ORAL CARE MOUTH RINSE
15.0000 mL | OROMUCOSAL | Status: DC
Start: 2023-02-12 — End: 2023-02-12
  Administered 2023-02-11 – 2023-02-12 (×6): 15 mL via OROMUCOSAL

## 2023-02-11 MED ORDER — FAMOTIDINE 20 MG PO TABS
20.0000 mg | ORAL_TABLET | Freq: Two times a day (BID) | ORAL | Status: DC
  Administered 2023-02-11 – 2023-02-12 (×2): 20 mg
  Filled 2023-02-11 (×2): qty 1

## 2023-02-11 MED ORDER — KETAMINE HCL 10 MG/ML IJ SOLN
INTRAMUSCULAR | Status: AC
  Filled 2023-02-11: qty 1

## 2023-02-11 MED ORDER — IPRATROPIUM-ALBUTEROL 0.5-2.5 (3) MG/3ML IN SOLN
3.0000 mL | Freq: Once | RESPIRATORY_TRACT | Status: AC
  Administered 2023-02-11: 3 mL via RESPIRATORY_TRACT
  Filled 2023-02-11: qty 3

## 2023-02-11 MED ORDER — NOREPINEPHRINE 4 MG/250ML-% IV SOLN
INTRAVENOUS | Status: AC
  Filled 2023-02-11: qty 250

## 2023-02-11 MED ORDER — SODIUM CHLORIDE 0.9 % IV SOLN: 500.0000 mg | Freq: Once | INTRAVENOUS | Status: DC

## 2023-02-11 MED ORDER — ROCURONIUM BROMIDE 10 MG/ML (PF) SYRINGE
PREFILLED_SYRINGE | INTRAVENOUS | Status: AC
  Filled 2023-02-11: qty 10

## 2023-02-11 MED ORDER — ALBUTEROL SULFATE (2.5 MG/3ML) 0.083% IN NEBU
INHALATION_SOLUTION | RESPIRATORY_TRACT | Status: AC
  Filled 2023-02-11: qty 6

## 2023-02-11 MED ORDER — SODIUM CHLORIDE 0.9 % IV SOLN: 1.0000 g | Freq: Once | INTRAVENOUS | Status: DC

## 2023-02-11 MED ORDER — EPINEPHRINE 0.3 MG/0.3ML IJ SOAJ
INTRAMUSCULAR | Status: AC
  Filled 2023-02-11: qty 0.3

## 2023-02-11 MED ORDER — SODIUM CHLORIDE 0.9 % IV SOLN
500.0000 mg | INTRAVENOUS | Status: DC
  Administered 2023-02-12: 500 mg via INTRAVENOUS
  Filled 2023-02-11 (×2): qty 5

## 2023-02-11 MED ORDER — OSELTAMIVIR PHOSPHATE 6 MG/ML PO SUSR
75.0000 mg | Freq: Two times a day (BID) | ORAL | Status: DC
  Administered 2023-02-12: 75 mg
  Filled 2023-02-11 (×3): qty 12.5

## 2023-02-11 MED ORDER — IPRATROPIUM-ALBUTEROL 0.5-2.5 (3) MG/3ML IN SOLN: 3.0000 mL | RESPIRATORY_TRACT | Status: DC | PRN

## 2023-02-11 MED ORDER — IPRATROPIUM-ALBUTEROL 0.5-2.5 (3) MG/3ML IN SOLN
3.0000 mL | RESPIRATORY_TRACT | Status: DC
Start: 2023-02-11 — End: 2023-02-12
  Administered 2023-02-11 – 2023-02-12 (×4): 3 mL via RESPIRATORY_TRACT
  Filled 2023-02-11 (×4): qty 3

## 2023-02-11 MED ORDER — FENTANYL 2500MCG IN NS 250ML (10MCG/ML) PREMIX INFUSION
INTRAVENOUS | Status: AC
  Filled 2023-02-11: qty 250

## 2023-02-11 MED ORDER — ALBUTEROL (5 MG/ML) CONTINUOUS INHALATION SOLN
5.0000 mg/h | INHALATION_SOLUTION | Freq: Once | RESPIRATORY_TRACT | Status: AC
  Administered 2023-02-11: 5 mg/h via RESPIRATORY_TRACT

## 2023-02-11 MED ORDER — VALPROATE SODIUM 100 MG/ML IV SOLN
250.0000 mg | Freq: Two times a day (BID) | INTRAVENOUS | Status: AC
  Administered 2023-02-11 – 2023-02-12 (×3): 250 mg via INTRAVENOUS
  Filled 2023-02-11: qty 250
  Filled 2023-02-11: qty 2.5
  Filled 2023-02-11: qty 250
  Filled 2023-02-11: qty 2.5

## 2023-02-11 MED ORDER — INSULIN ASPART 100 UNIT/ML IJ SOLN
0.0000 [IU] | INTRAMUSCULAR | Status: DC
  Administered 2023-02-11: 3 [IU] via SUBCUTANEOUS
  Administered 2023-02-12: 2 [IU] via SUBCUTANEOUS
  Administered 2023-02-12: 1 [IU] via SUBCUTANEOUS
  Administered 2023-02-12: 2 [IU] via SUBCUTANEOUS

## 2023-02-11 MED ORDER — METHYLPREDNISOLONE SODIUM SUCC 125 MG IJ SOLR
125.0000 mg | Freq: Once | INTRAMUSCULAR | Status: AC
  Administered 2023-02-11: 125 mg via INTRAVENOUS
  Filled 2023-02-11: qty 2

## 2023-02-11 MED ORDER — PROPOFOL 1000 MG/100ML IV EMUL
5.0000 ug/kg/min | INTRAVENOUS | Status: DC
  Administered 2023-02-11: 15 ug/kg/min via INTRAVENOUS
  Filled 2023-02-11: qty 100

## 2023-02-11 MED ORDER — SODIUM CHLORIDE 0.9 % IV SOLN: 1.0000 g | INTRAVENOUS | Status: DC

## 2023-02-11 MED ORDER — FENTANYL 2500MCG IN NS 250ML (10MCG/ML) PREMIX INFUSION
25.0000 ug/h | INTRAVENOUS | Status: DC
  Administered 2023-02-11: 25 ug/h via INTRAVENOUS

## 2023-02-11 MED ORDER — METHYLPREDNISOLONE SODIUM SUCC 40 MG IJ SOLR
40.0000 mg | Freq: Two times a day (BID) | INTRAMUSCULAR | Status: DC
  Administered 2023-02-12: 40 mg via INTRAVENOUS
  Filled 2023-02-11: qty 1

## 2023-02-11 MED ORDER — IPRATROPIUM-ALBUTEROL 0.5-2.5 (3) MG/3ML IN SOLN
RESPIRATORY_TRACT | Status: AC
  Administered 2023-02-11: 3 mL via RESPIRATORY_TRACT
  Filled 2023-02-11: qty 6

## 2023-02-11 MED ORDER — ASPIRIN 81 MG PO CHEW
81.0000 mg | CHEWABLE_TABLET | Freq: Every day | ORAL | Status: DC
  Administered 2023-02-11: 81 mg
  Filled 2023-02-11: qty 1

## 2023-02-11 NOTE — Progress Notes (Signed)
eLink Physician-Brief Progress Note Patient Name: Jimmy Duran DOB: August 24, 1955 MRN: [id number]   Date of Service  02/11/2023  HPI/Events of Note  Patient with a long standing history of smoking related COPD admitted with acute respiratory failure secondary to acute exacerbation of COPD, and Influenza A pneumonia, patient is intubated and mechanically ventilated.  eICU Interventions  New Patient Evaluation.        Thomasene Lot Zeyna Mkrtchyan 02/11/2023, 8:52 PM

## 2023-02-11 NOTE — ED Notes (Signed)
[facility] is here to pick pt up.

## 2023-02-11 NOTE — ED Notes (Signed)
Attempted to give report, nurse was in shift change.

## 2023-02-11 NOTE — ED Notes (Addendum)
Nurse was called to pt room pt was SOB, sweating, not responding. Pt O2 was in the 60s on Bipap. EDP, Respiratory, was called into room by nurse.  Pt was intubated at 1705, 71/2 ET, 23 at the lip, color change noted, OG 55 at the lip, 18 french.  Ketamine 150 given at 1704 Rocuronium given at 1705  Levo was started at 1714 Fentanyl started at 1716 2 NS bags bloused Propofol started at 1742

## 2023-02-11 NOTE — ED Triage Notes (Signed)
Pt c/o sob for the last couple of days; pt has non-productive cough and has had decrease in appetite  Pt has been around a family member with flu

## 2023-02-11 NOTE — ED Notes (Signed)
Wife updated as to patient's status. 

## 2023-02-11 NOTE — ED Notes (Signed)
The ABG that was resulted at 1752 was done on an FIO2 of 50% not 21% that is in the reading.

## 2023-02-11 NOTE — Progress Notes (Signed)
   PCCM transfer request    Sending physician: Jarold Motto  Sending facility: [facility]  Reason for transfer: acute respiratory failure with hypercapnia & hypoxia on vent  Brief case summary: Jimmy Duran is a 67 y/o M with h/o COPD who presented with several days of SOB, worsened during his ED visit. Trial of BiPAP, but mentation and oxygen continued to deteriorate so he was intubated.   Recommendations made prior to transfer: start empiric CAP antibiotics, tamiflu. Needs CVC if norepi goes >47mcg.   Transfer accepted: yes    Steffanie Dunn 02/11/23 5:30 PM [facility]  For contact information, see Amion. If no response to pager, please call PCCM consult pager. After hours, 7PM- 7AM, please call Elink.

## 2023-02-11 NOTE — ED Provider Notes (Signed)
[facility] AT [facility] Provider Note   CSN: [id number] Arrival date & time: 02/11/23  1430     History  Chief Complaint  Patient presents with   Shortness of Breath   HPI Jimmy Duran is a 67 y.o. male with COPD, type 2 diabetes, CAD, NSTEMI, AAA, TIA presenting for shortness of breath.  Started 4 days ago.  Worse with exertion and at night but not lying flat.  Also endorses a nonproductive cough.  Unsure if he has had a fever.  Denies chest pain.  States a family member was diagnosed with the flu.  He reports that he has received both flu and COVID vaccines.  States he quit smoking 4 days ago.   Shortness of Breath      Home Medications Prior to Admission medications   Medication Sig Start Date End Date Taking? Authorizing Provider  albuterol (PROVENTIL) (2.5 MG/3ML) 0.083% nebulizer solution Take 3 mLs (2.5 mg total) by nebulization every 6 (six) hours as needed for wheezing or shortness of breath. 09/23/22  Yes [name], MD  aspirin EC 81 MG tablet Take 1 tablet (81 mg total) by mouth daily at 6 (six) AM. Swallow whole. 10/14/21  Yes Rhyne, Samantha J, PA-C  carvedilol (COREG) 25 MG tablet Take 1 tablet (25 mg total) by mouth 2 (two) times daily. 11/27/22  Yes Sharlene Dory, NP  diphenhydrAMINE (BENADRYL) 25 MG tablet Take 25 mg by mouth daily as needed for allergies.    Yes [provider]  divalproex (DEPAKOTE ER) 500 MG 24 hr tablet Take 1 tablet (500 mg total) by mouth daily. 11/13/22  Yes Ihor Austin, NP  fenofibrate 160 MG tablet Take 1 tablet (160 mg total) by mouth daily. 01/02/23  Yes Sharlene Dory, NP  Glucagon (GVOKE HYPOPEN 2-PACK) 1 MG/0.2ML SOAJ Inject 1 mg into the skin as needed (hypoglycemia). 12/11/21  Yes [name], MD  insulin glargine (LANTUS) 100 UNIT/ML injection Inject 0.24 mLs (24 Units total) into the skin daily. 04/04/22  Yes [name], MD  lansoprazole (PREVACID) 30 MG capsule TAKE  1 CAPSULE BY MOUTH TWICE DAILY BEFORE A MEAL 07/09/22  Yes Rourk, Gerrit Friends, MD  lisinopril (ZESTRIL) 20 MG tablet Take 1 tablet (20 mg total) by mouth 2 (two) times daily. 01/16/23  Yes Sharlene Dory, NP  metFORMIN (GLUCOPHAGE) 1000 MG tablet TAKE 1 TABLET BY MOUTH TWICE DAILY WITH A MEAL 01/27/23  Yes [name], MD  nitroGLYCERIN (NITROSTAT) 0.4 MG SL tablet Place 1 tablet (0.4 mg total) under the tongue every 5 (five) minutes as needed for chest pain (Do not excieed more than 3 tablets in 15 minutes.). 03/11/22 02/11/23 Yes Sharlene Dory, NP  NOVOLOG 100 UNIT/ML injection Inject 14 Units into the skin 3 (three) times daily with meals. 01/08/22  Yes [name], MD  rosuvastatin (CRESTOR) 40 MG tablet Take 1 tablet by mouth once daily 01/01/23  Yes Jonelle Sidle, MD  Semaglutide, 1 MG/DOSE, 4 MG/3ML SOPN Inject 1 mg as directed once a week. 12/17/22  Yes [name], MD  Continuous Blood Gluc Sensor (FREESTYLE LIBRE 3 SENSOR) MISC 1 each by Does not apply route every 14 (fourteen) days. Place 1 sensor on the skin every 14 days. Use to check glucose continuously 12/19/21   [name], MD      Allergies    Patient has no known allergies.    Review of Systems   Review of  Systems  Respiratory:  Positive for shortness of breath.     Physical Exam Updated Vital Signs BP (!) 154/100   Pulse (!) 210   Temp 98.7 F (37.1 C) (Oral)   Resp (!) 27   Ht 5\' 7"  (1.702 m)   Wt 61.9 kg   SpO2 100%   BMI 21.37 kg/m  Physical Exam Vitals and nursing note reviewed.  HENT:     Head: Normocephalic and atraumatic.     Mouth/Throat:     Mouth: Mucous membranes are moist.  Eyes:     General:        Right eye: No discharge.        Left eye: No discharge.     Conjunctiva/sclera: Conjunctivae normal.  Cardiovascular:     Rate and Rhythm: Normal rate and regular rhythm.     Pulses: Normal pulses.     Heart sounds: Normal heart sounds.  Pulmonary:     Effort: Pulmonary  effort is normal.     Breath sounds: Examination of the right-middle field reveals decreased breath sounds. Examination of the left-middle field reveals decreased breath sounds. Examination of the right-lower field reveals decreased breath sounds. Examination of the left-lower field reveals decreased breath sounds. Decreased breath sounds, wheezing and rhonchi present. No rales.  Abdominal:     General: Abdomen is flat.     Palpations: Abdomen is soft.  Skin:    General: Skin is warm and dry.  Neurological:     General: No focal deficit present.  Psychiatric:        Mood and Affect: Mood normal.    ED Results / Procedures / Treatments   Labs (all labs ordered are listed, but only abnormal results are displayed) Labs Reviewed  RESP PANEL BY RT-PCR (RSV, FLU A&B, COVID)  RVPGX2 - Abnormal; Notable for the following components:      Result Value   Influenza A by PCR POSITIVE (*)    All other components within normal limits  BASIC METABOLIC PANEL - Abnormal; Notable for the following components:   Glucose, Bld 191 (*)    All other components within normal limits  CBC WITH DIFFERENTIAL/PLATELET - Abnormal; Notable for the following components:   Lymphs Abs 0.4 (*)    All other components within normal limits  BLOOD GAS, VENOUS - Abnormal; Notable for the following components:   pH, Ven 7.08 (*)    pCO2, Ven 107 (*)    Bicarbonate 31.7 (*)    All other components within normal limits  BLOOD GAS, ARTERIAL - Abnormal; Notable for the following components:   pH, Arterial 7.2 (*)    pCO2 arterial 62 (*)    pO2, Arterial 174 (*)    Acid-base deficit 4.7 (*)    All other components within normal limits  HEPATIC FUNCTION PANEL - Abnormal; Notable for the following components:   Alkaline Phosphatase 37 (*)    All other components within normal limits  GLUCOSE, CAPILLARY - Abnormal; Notable for the following components:   Glucose-Capillary 202 (*)    All other components within normal  limits  BRAIN NATRIURETIC PEPTIDE - Abnormal; Notable for the following components:   B Natriuretic Peptide 294.2 (*)    All other components within normal limits  HEMOGLOBIN A1C - Abnormal; Notable for the following components:   Hgb A1c MFr Bld 8.0 (*)    All other components within normal limits  MAGNESIUM - Abnormal; Notable for the following components:   Magnesium 2.5 (*)  All other components within normal limits  GLUCOSE, CAPILLARY - Abnormal; Notable for the following components:   Glucose-Capillary 207 (*)    All other components within normal limits  POCT I-STAT 7, (LYTES, BLD GAS, ICA,H+H) - Abnormal; Notable for the following components:   pH, Arterial 7.307 (*)    pO2, Arterial 283 (*)    Acid-base deficit 3.0 (*)    HCT 38.0 (*)    Hemoglobin 12.9 (*)    All other components within normal limits  CULTURE, RESPIRATORY W GRAM STAIN  CULTURE, BLOOD (ROUTINE X 2)  CULTURE, BLOOD (ROUTINE X 2)  MRSA NEXT GEN BY PCR, NASAL  LACTIC ACID, PLASMA  PROCALCITONIN  PHOSPHORUS  MAGNESIUM  CBC  BASIC METABOLIC PANEL  STREP PNEUMONIAE URINARY ANTIGEN  LEGIONELLA PNEUMOPHILA SEROGP 1 UR AG  LACTIC ACID, PLASMA  BLOOD GAS, ARTERIAL  CALCIUM, IONIZED    EKG None  Radiology DG CHEST PORT 1 VIEW  Result Date: 02/11/2023 CLINICAL DATA:  OG tube placement, endotracheal tube placement EXAM: PORTABLE CHEST 1 VIEW COMPARISON:  02/11/2023 FINDINGS: Endotracheal tube 5 cm above the carina. OG tube enters the stomach. Lungs clear. There is hyperinflation of the lungs. No effusions. Heart is normal size. No acute bony abnormality. IMPRESSION: Support devices in expected position as above. Hyperinflation. No active disease. Electronically Signed   By: Charlett Nose M.D.   On: 02/11/2023 21:46   DG Abd 1 View  Result Date: 02/11/2023 CLINICAL DATA:  Check gastric catheter placement EXAM: ABDOMEN - 1 VIEW COMPARISON:  None Available. FINDINGS: Gastric catheter is noted with the tip  in the stomach. Proximal side port lies at the gastroesophageal junction. This could be advanced deeper into the stomach. IMPRESSION: Gastric catheter as described. This could be advanced slightly deeper into the stomach. Electronically Signed   By: Alcide Clever M.D.   On: 02/11/2023 21:31   DG Chest Portable 1 View  Result Date: 02/11/2023 CLINICAL DATA:  Intubation. EXAM: PORTABLE CHEST 1 VIEW COMPARISON:  Chest radiograph dated 05/22/2009. FINDINGS: Endotracheal tube with tip approximately 4 cm above the carina. Enteric tube with tip in the proximal stomach and side port in the region of the GE junction. Recommend further advancing by additional 7 cm. Mild diffuse interstitial prominence. No focal consolidation, pleural effusion, pneumothorax. The cardiac silhouette is within normal limits. No acute osseous pathology. IMPRESSION: 1. Endotracheal tube with tip approximately 4 cm above the carina. 2. Enteric tube with side port in the region of the GE junction. Recommend further advancing by additional 7 cm. Electronically Signed   By: Elgie Collard M.D.   On: 02/11/2023 18:22   DG Abdomen 1 View  Result Date: 02/11/2023 CLINICAL DATA:  Intubation. EXAM: PORTABLE CHEST 1 VIEW COMPARISON:  Chest radiograph dated 05/22/2009. FINDINGS: Endotracheal tube with tip approximately 4 cm above the carina. Enteric tube with tip in the proximal stomach and side port in the region of the GE junction. Recommend further advancing by additional 7 cm. Mild diffuse interstitial prominence. No focal consolidation, pleural effusion, pneumothorax. The cardiac silhouette is within normal limits. No acute osseous pathology. IMPRESSION: 1. Endotracheal tube with tip approximately 4 cm above the carina. 2. Enteric tube with side port in the region of the GE junction. Recommend further advancing by additional 7 cm. Electronically Signed   By: Elgie Collard M.D.   On: 02/11/2023 18:22   DG Chest Port 1 View  Result Date:  02/11/2023 CLINICAL DATA:  Shortness of breath. EXAM: PORTABLE CHEST  1 VIEW COMPARISON:  10/14/2022. FINDINGS: Bilateral lung fields are clear. Bilateral costophrenic angles are clear. Normal cardio-mediastinal silhouette. No acute osseous abnormalities. The soft tissues are within normal limits. IMPRESSION: *No active disease. Electronically Signed   By: Jules Schick M.D.   On: 02/11/2023 16:46    Procedures .Critical Care  Performed by: Gareth Eagle, PA-C Authorized by: Gareth Eagle, PA-C   Critical care provider statement:    Critical care time (minutes):  90   Critical care was necessary to treat or prevent imminent or life-threatening deterioration of the following conditions:  Respiratory failure   Critical care was time spent personally by me on the following activities:  Development of treatment plan with patient or surrogate, discussions with consultants, evaluation of patient's response to treatment, examination of patient, ordering and review of laboratory studies, ordering and review of radiographic studies, ordering and performing treatments and interventions, pulse oximetry, re-evaluation of patient's condition and review of old charts Procedure Name: Intubation Date/Time: 02/11/2023 11:05 PM  Performed by: Gareth Eagle, PA-CPre-anesthesia Checklist: Patient identified, Timeout performed, Emergency Drugs available, Suction available and Patient being monitored Oxygen Delivery Method: Ambu bag Preoxygenation: Pre-oxygenation with 100% oxygen Induction Type: Rapid sequence Ventilation: Mask ventilation without difficulty Laryngoscope Size: Glidescope Tube size: 7.5 mm Number of attempts: 1 Airway Equipment and Method: Patient positioned with wedge pillow and Video-laryngoscopy Placement Confirmation: ETT inserted through vocal cords under direct vision and Breath sounds checked- equal and bilateral Tube secured with: ETT holder        Medications Ordered in  ED Medications  albuterol (PROVENTIL) (2.5 MG/3ML) 0.083% nebulizer solution (  Not Given 02/11/23 1720)  propofol (DIPRIVAN) 1000 MG/100ML infusion (15 mcg/kg/min  60.3 kg Intravenous New Bag/Given 02/11/23 2218)  docusate (COLACE) 50 MG/5ML liquid 100 mg (100 mg Per Tube Given 02/11/23 2207)  polyethylene glycol (MIRALAX / GLYCOLAX) packet 17 g (17 g Per Tube Given 02/11/23 2207)  fentaNYL in NS (38mcg/ml) infusion-PREMIX (25 mcg/hr Intravenous New Bag/Given 02/11/23 2144)  fentaNYL (SUBLIMAZE) bolus via infusion 25-100 mcg (25 mcg Intravenous Bolus from Bag 02/11/23 2232)  enoxaparin (LOVENOX) injection 40 mg (40 mg Subcutaneous Given 02/11/23 2206)  famotidine (PEPCID) tablet 20 mg (20 mg Per Tube Given 02/11/23 2206)  acetaminophen (TYLENOL) tablet 650 mg (has no administration in time range)  insulin aspart (novoLOG) injection 0-9 Units (3 Units Subcutaneous Given 02/11/23 2206)  ipratropium-albuterol (DUONEB) 0.5-2.5 (3) MG/3ML nebulizer solution 3 mL (3 mLs Nebulization Given 02/11/23 2302)  ipratropium-albuterol (DUONEB) 0.5-2.5 (3) MG/3ML nebulizer solution 3 mL (has no administration in time range)  methylPREDNISolone sodium succinate (SOLU-MEDROL) 40 mg/mL injection 40 mg (has no administration in time range)  aspirin chewable tablet 81 mg (81 mg Per Tube Given 02/11/23 2206)  valproate (DEPACON) 250 mg in dextrose 5 % 50 mL IVPB (250 mg Intravenous New Bag/Given 02/11/23 2150)  cefTRIAXone (ROCEPHIN) 1 g in sodium chloride 0.9 % 100 mL IVPB (has no administration in time range)  azithromycin (ZITHROMAX) 500 mg in sodium chloride 0.9 % 250 mL IVPB (has no administration in time range)  oseltamivir (TAMIFLU) 6 MG/ML suspension 75 mg (has no administration in time range)  norepinephrine (LEVOPHED) 4mg  in (0.016 mg/mL) premix infusion (3 mcg/min Intravenous New Bag/Given 02/11/23 2148)  sodium chloride 0.9 % bolus 500 mL (has no administration in time range)  Oral  care mouth rinse (has no administration in time range)  Oral care mouth rinse (has no administration in time range)  Chlorhexidine Gluconate Cloth 2 % PADS 6 each (has no administration in time range)  ipratropium-albuterol (DUONEB) 0.5-2.5 (3) MG/3ML nebulizer solution 3 mL (3 mLs Nebulization Given 02/11/23 1744)  methylPREDNISolone sodium succinate (SOLU-MEDROL) 125 mg/2 mL injection 125 mg (125 mg Intravenous Given 02/11/23 1520)  magnesium sulfate IVPB 2 g 50 mL (0 g Intravenous Stopped 02/11/23 1620)  ipratropium-albuterol (DUONEB) 0.5-2.5 (3) MG/3ML nebulizer solution 3 mL (3 mLs Nebulization Given 02/11/23 1528)  ipratropium-albuterol (DUONEB) 0.5-2.5 (3) MG/3ML nebulizer solution 3 mL (3 mLs Nebulization Given 02/11/23 1528)  albuterol (PROVENTIL,VENTOLIN) solution continuous neb (5 mg/hr Nebulization Given 02/11/23 1610)  EPINEPHrine (EPI-PEN) 0.3 mg/0.3 mL injection (  Given 02/11/23 1744)  ketamine (KETALAR) 10 MG/ML injection (  Given 02/11/23 1745)  rocuronium (ZEMURON) 100 MG/10ML injection (  Given 02/11/23 1745)  fentaNYL 10 mcg/ml infusion (0 mcg/hr  Stopped 02/11/23 2055)  norepinephrine (LEVOPHED) 4-5 MG/250ML-% infusion SOLN (0 mcg/kg/min  Stopped 02/11/23 2056)  fentaNYL (SUBLIMAZE) injection 25 mcg (25 mcg Intravenous Given 02/11/23 2147)    ED Course/ Medical Decision Making/ A&P Clinical Course as of 02/11/23 2302  Tue Feb 11, 2023  1745 On reassessment after placement of BiPAP, patient was hypoxic into the high 60s and mentation seem to have declined overall.  On auscultation, air movement seem to be listening more slightly worse.  Venous blood gas also noted to be acidotic and hypercarbic.  At this time proceeded with RSI.  Intubation went well with 1 attempt.  Was subsequently hypotensive.  Started on bolus of fluids, Levophed and fentanyl drip.  Patient responded well to fluids and Levophed.  Maps initially in the mid 60s but improved to the mid 90s.  With marked  improvement BP, initiated propofol for sedation.  RASS goal -2.  [JR]    Clinical Course User Index [JR] Vaughan Browner   MDM  Initial Impression and Ddx 67 year old well-appearing male presenting for shortness of breath.  Initially hypoxic and tachypneic.  Placed on 2 L.  Exam notable for diffusely diminished and coarse breath sounds.  DDx includes COPD exacerbation, pneumonia, sepsis, PE, CHF exacerbation, other. Patient PMH that increases complexity of ED encounter:   COPD, type 2 diabetes, CAD, NSTEMI, AAA, TIA  Interpretation of Diagnostics - I independent reviewed and interpreted the labs as followed: Acidemia, hypercarbia, flu positive  - I independently visualized the following imaging with scope of interpretation limited to determining acute life threatening conditions related to emergency care: Chest x-ray, which revealed acute findings.  Post intubation chest x-ray revealed tube 4 cm above the carina. Abdominal x-ray revealed sideport of the enteric tube approximately about the GE E junction.  -I personally reviewed and interpreted EKG which revealed sinus rhythm  Patient Reassessment and Ultimate Disposition/Management On reassessment after initiation of BiPAP, lung sounds with no appreciable improvement and patient became hypoxic into the mid 60s with declining level of mentation and tachypnea.  At this point it was decided to intubate.  Also gas revealing that he was acidotic and hypercarbic.  Intubation went well with 1 attempt.  Postintubation x-ray revealed tube was in adequate position.  Was subsequently hypotensive.  Started Levophed and fluids.  As MAP improved initiated propofol for sedation.  Admitted to ICU for hypercarbic respiratory failure secondary to likely COPD exacerbation in setting of flu.  Transferred to [facility].  Dr. Ishmael Holter is admitting attending.  Patient management required discussion with the following services or consulting groups:   Intensivist Service  Complexity of Problems  Addressed Acute complicated illness or Injury  Additional Data Reviewed and Analyzed Further history obtained from: Further history from spouse/family member, Past medical history and medications listed in the EMR, and Prior ED visit notes  Patient Encounter Risk Assessment Consideration of hospitalization                                         Final Clinical Impression(s) / ED Diagnoses Final diagnoses:  Acute respiratory failure with hypoxia [facility])  Influenza A    Rx / DC Orders ED Discharge Orders     None         Gareth Eagle, PA-C 02/11/23 2346    Rondel Baton, MD 02/12/23 1100

## 2023-02-11 NOTE — Progress Notes (Signed)
Sputum sample collected and sent to the lab.  

## 2023-02-11 NOTE — H&P (Signed)
NAME:  Jimmy Duran, MRN:  [id number], DOB:  01-18-1956, LOS: 0 ADMISSION DATE:  02/11/2023, CONSULTATION DATE:  12/10 REFERRING MD:  Leim Fabry (ED) , CHIEF COMPLAINT:  respiratory failure   History of Present Illness:  Jimmy Duran is a 67 y/o gentleman with a PMH significant for HTN, CAD, NSTEMI, PAD, COPD,  TIA/Stroke, Seizures, Diabetes, who presented to the hospital with several days of SOB, cough, decreased appetite. He had been around a family member with the flu recently. He was smoking until 4 days ago. Per PTA med list, he was not on bronchodilators as an outpatient. He was hypoxic in the ED and initially started on 2L [place] but had persistent desaturation and confusion. He was upgraded to BiPAP but over about an hour continued to deteriorate with worse confusion and hypoxia into the 60's, prompting intubation in the ED. Post-intubation he required vasopressors despite 2 liters IVF bolus to tolerate sedation. Viral panel + Influenza A  PCCM consulted for transfer to [facility] for admission  Pertinent  Medical History  COPD CAD/NSTEMI DM2 AAA TIA/CVA Seizures PAD GERD HTN Tobacco abuse  Significant Hospital Events: Including procedures, antibiotic start and stop dates in addition to other pertinent events   12/10 presented to [facility] ED, Hypoxic, Influenza A +, intubated, Levo, transferred to [facility].  Interim History / Subjective:   Objective   Blood pressure 91/67, pulse (!) 161, temperature 98.7 F (37.1 C), temperature source Oral, resp. rate (!) 22, height 5\' 7"  (1.702 m), weight 60.3 kg, SpO2 98%.    Vent Mode: PRVC FiO2 (%):  [40 %-60 %] 40 % Set Rate:  [22 bmp] 22 bmp Vt Set:  [530 mL] 530 mL PEEP:  [5 cmH20] 5 cmH20 Plateau Pressure:  [25 cmH20] 25 cmH20  No intake or output data in the 24 hours ending 02/11/23 2029 Filed Weights   02/11/23 1438  Weight: 60.3 kg  Physical Exam Vitals reviewed.  Constitutional:      General: He is not in acute distress.     Comments: Sedated on Propofol and Fentanyl infusions, Intubated, NAD  HENT:     Head: Normocephalic and atraumatic.  Eyes:     Pupils: Pupils are equal, round, and reactive to light.     Comments: Pupils 2 bilaterally  Cardiovascular:     Rate and Rhythm: Normal rate and regular rhythm.     Pulses: Normal pulses.  Pulmonary:     Comments: Intubated, Thick cream colored secretions. Lung sounds course throughout, Right sided wheezes Abdominal:     General: Bowel sounds are normal.     Palpations: Abdomen is soft.  Musculoskeletal:     Cervical back: Neck supple.     Right lower leg: No edema.     Left lower leg: No edema.  Skin:    General: Skin is dry.     Capillary Refill: Capillary refill takes less than 2 seconds.     Comments: Cool extremities  Neurological:     Comments: With sedation held: Drowsy, follows commands  Psychiatric:        Mood and Affect: Mood normal.    Resolved Hospital Problem list    Assessment & Plan:  Acute Respiratory Failure with Hypoxia and Hypercapnia requiring MV Influenza A Pneumonia AE COPD, w/ current Tobacco Use Intubated 12/10 -LTVV -VAP prevention protocol -PAD protocol for sedation -Wean FiO2 as able to maintain SpO2 > 88-90% -Repeat ABG and Chest X-ray on arrival and trend as needed -Consideration for CTA chest  to r/o PE -Tamiflu, Steroids, bronchodilators for flu and COPD -Sedation: Propofol and Fentanyl infusions, wean as tolerated -CAP coverage: Ceftriaxone and Azithromycin - consideration to broaden -Send cultures: BC x 2, sputum, UA, MRSA swab -Check procalcitonin and lactic acid  Acute Hypotension, multifactorial, sedation +/- infection Required Norepinephrine after intubation and during transport, turned off upon admission -Norepinephrine infusion if needed, titrate to maintain MAP >65, currently o -Antibiotics as above -Hold PTA lisinopril, coreg - resume as appropriate  DM 2 with hyperglycemia -Hgb A1c 07/2022: 8.9,  poorly controlled  -SSI PRN; may need half-dose long acting  -Hold PTA metformin, semiglutin, and Lantus -Goal BG 140-180  History of seizures -Con't PTA depakote  H/o CAD, HLD, HTN -Con't PTA aspirin, crestor -Hold PTA fenofibrate, coreg, lisinopril  H/o GERD -PPI  Best Practice (right click and "Reselect all SmartList Selections" daily)   Diet/type: NPO DVT prophylaxis prophylactic Lovenox , SCDs Pressure ulcer(s): N/A GI prophylaxis: PPI Lines: N/A Foley:  Yes, and it is still needed Code Status:  full code, per Spouse Last date of multidisciplinary goals of care discussion: I called and spoke with spouse on the phone. She is aware of his current plan. She is feeling febrile and concerned she has the fluid, therefore may not be able to make it in tomorrow. She plans to call for updates if that is the case. All questions answered  Labs   CBC: Recent Labs  Lab 02/11/23 1518  WBC 7.4  NEUTROABS 6.0  HGB 13.8  HCT 41.8  MCV 96.1  PLT 203   Basic Metabolic Panel: Recent Labs  Lab 02/11/23 1518  NA 135  K 4.1  CL 98  CO2 26  GLUCOSE 191*  BUN 23  CREATININE 0.96  CALCIUM 9.0   GFR: Estimated Creatinine Clearance: 65.4 mL/min (by C-G formula based on SCr of 0.96 mg/dL). Recent Labs  Lab 02/11/23 1518  WBC 7.4   ABG    Component Value Date/Time   PHART 7.2 (L) 02/11/2023 1752   PCO2ART 62 (H) 02/11/2023 1752   PO2ART 174 (H) 02/11/2023 1752   HCO3 24.2 02/11/2023 1752   TCO2 22 07/17/2020 0547   ACIDBASEDEF 4.7 (H) 02/11/2023 1752   O2SAT 99.3 02/11/2023 1752    HbA1C: Hemoglobin A1C  Date/Time Value Ref Range Status  07/23/2022 11:30 AM 8.9 (A) 4.0 - 5.6 % Final  12/23/2020 12:00 AM 9.5  Final   Hgb A1c MFr Bld  Date/Time Value Ref Range Status  10/13/2022 09:18 AM 8.6 (H) 4.8 - 5.6 % Final    Comment:    (NOTE)         Prediabetes: 5.7 - 6.4         Diabetes: >6.4         Glycemic control for adults with diabetes: <7.0   04/11/2022  10:45 AM 9.1 (H) 4.8 - 5.6 % Final    Comment:             Prediabetes: 5.7 - 6.4          Diabetes: >6.4          Glycemic control for adults with diabetes: <7.0    CBG: Recent Labs  Lab 02/11/23 2016  GLUCAP 202*   Review of Systems:   Unable to assess intubated/sedated  Past Medical History:  He,  has a past medical history of AAA (abdominal aortic aneurysm) (HCC), Arthritis, Carotid artery disease (HCC), Cataract, Colon polyp, COPD (chronic obstructive pulmonary disease) (HCC), Coronary artery disease,  Essential hypertension, Focal seizure (HCC) (10/15/2022), GERD (gastroesophageal reflux disease), Hemorrhoids, History of TIA (transient ischemic attack), Low back pain, Lumbar radiculopathy, Mixed hyperlipidemia, Palpitations, Peripheral vascular disease (HCC), Shingles (09/23/2019), Stroke (HCC), Tobacco dependence, and Type 2 diabetes mellitus (HCC).   Surgical History:   Past Surgical History:  Procedure Laterality Date   APPENDECTOMY  1970   BIOPSY  12/08/2017   Procedure: BIOPSY;  Surgeon: Corbin Ade, MD;  Location: [facility];  Service: Endoscopy;;  esophagus    BIOPSY  12/28/2020   Procedure: BIOPSY;  Surgeon: Corbin Ade, MD;  [facility]: [facility];  Service: Endoscopy;;   BIOPSY  07/12/2021   Procedure: BIOPSY;  Surgeon: Corbin Ade, MD;  [facility]: [facility];  Service: Endoscopy;;   BREAST SURGERY Right    Cyst resection on the right   CARDIAC CATHETERIZATION  1990's   in [place]. no stent placement   CATARACT EXTRACTION W/PHACO Right 06/18/2016   Procedure: CATARACT EXTRACTION PHACO AND INTRAOCULAR LENS PLACEMENT (IOC);  Surgeon: Galen Manila, MD;  Location: [facility] ORS;  Service: Ophthalmology;  Laterality: Right;  [name] 35.7 AP% 16.6 CDE 5.94 Fluid pack lot # [id number]   CATARACT EXTRACTION W/PHACO Left 07/16/2016   Procedure: CATARACT EXTRACTION PHACO AND INTRAOCULAR LENS PLACEMENT (IOC);  Surgeon: Galen Manila, MD;  Location: [facility]  ORS;  Service: Ophthalmology;  Laterality: Left;  [name] 51.8 AP% 12.7 CDE 6.58 Fluid Pack Lot # [id number]   CHOLECYSTECTOMY  1993   COLONOSCOPY  02/12/2007   [id number] friable anal canal hemorrhoids, otherwise normal rectum and colon   COLONOSCOPY N/A 07/16/2012   RMR: Colonic diverticulosis. 4 mm tubular adenoma   COLONOSCOPY WITH PROPOFOL N/A 12/08/2017   Dr. Jena Gauss: sigmoid and descending colon diverticulosis, transverse colon polyp (TUBULAR ADENOMA)   COLONOSCOPY WITH PROPOFOL N/A 03/06/2022   Procedure: COLONOSCOPY WITH PROPOFOL;  Surgeon: Corbin Ade, MD;  [facility];  Service: Endoscopy;  Laterality: N/A;  12:30 PM   ENDARTERECTOMY Right 10/08/2019   Procedure: RIGHT CAROTID ENDARTERECTOMY;  Surgeon: Larina Earthly, MD;  Location: [facility] OR;  Service: Vascular;  Laterality: Right;   ESOPHAGOGASTRODUODENOSCOPY (EGD) WITH ESOPHAGEAL DILATION N/A 07/16/2012   RMR: +Candida esophagitis, Erosive reflux esophagiits. Schatizi's ring status post dilation. Hiatal hernia. Antral erosions status post biopsy.    ESOPHAGOGASTRODUODENOSCOPY (EGD) WITH PROPOFOL N/A 12/08/2017   Dr. Jena Gauss: esophagitis, query EOE but negative for increased eosinophils on path, small hiatal hernia, normal stomach, normal duodenum, PAS stain with rare yeast forms on stain. Empiric dilation   ESOPHAGOGASTRODUODENOSCOPY (EGD) WITH PROPOFOL N/A 12/28/2020   esophagitis without bleeding, s/p biopsy and dilation. Antral erosions s/p biopsy.   ESOPHAGOGASTRODUODENOSCOPY (EGD) WITH PROPOFOL N/A 07/12/2021   Surgeon: Corbin Ade, MD;  Cobblestoning appearance of distal esophagus, slight narrowing of distal esophagus s/p dilation with 56 Fr and biopsy, gastric erosions biopsied, normal examined duodenum.  Esophageal biopsy with reactive squamous mucosa, gastric biopsy with reactive gastropathy, negative for H. pylori.   ESOPHAGOGASTRODUODENOSCOPY (EGD) WITH PROPOFOL N/A 03/06/2022   Procedure:  ESOPHAGOGASTRODUODENOSCOPY (EGD) WITH PROPOFOL;  Surgeon: Corbin Ade, MD;  [facility];  Service: Endoscopy;  Laterality: N/A;   EYE SURGERY Bilateral 2018   cataract   GIVENS CAPSULE STUDY N/A 03/13/2022   Procedure: GIVENS CAPSULE STUDY;  Surgeon: Corbin Ade, MD;  [facility];  Service: Endoscopy;  Laterality: N/A;  7:30am   HERNIA REPAIR     JOINT REPLACEMENT     KNEE  SURGERY     Left knee arthroscopy torn medial meniscus grade 4 chondral changes medial femoral condyle tibial plateau   MALONEY DILATION N/A 12/08/2017   Procedure: MALONEY DILATION;  Surgeon: Corbin Ade, MD;  [facility];  Service: Endoscopy;  Laterality: N/A;   MALONEY DILATION N/A 12/28/2020   Procedure: [name] DILATION;  Surgeon: Corbin Ade, MD;  [facility];  Service: Endoscopy;  Laterality: N/A;   MALONEY DILATION N/A 07/12/2021   Procedure: [name] DILATION;  Surgeon: Corbin Ade, MD;  [facility];  Service: Endoscopy;  Laterality: N/A;   PATCH ANGIOPLASTY Right 10/08/2019   Procedure: PATCH ANGIOPLASTY of the right common carotid artery using hemashield plaltinum finesse patch;  Surgeon: Larina Earthly, MD;  Location: [facility] OR;  Service: Vascular;  Laterality: Right;   PERIPHERAL VASCULAR INTERVENTION  07/17/2020   Procedure: PERIPHERAL VASCULAR INTERVENTION;  Surgeon: Maeola Harman, MD;  Location: [facility] INVASIVE CV LAB;  Service: Cardiovascular;;   POLYPECTOMY  12/08/2017   Procedure: POLYPECTOMY;  Surgeon: Corbin Ade, MD;  [facility];  Service: Endoscopy;;  colon   POLYPECTOMY  03/06/2022   Procedure: POLYPECTOMY;  Surgeon: Corbin Ade, MD;  [facility];  Service: Endoscopy;;   TOTAL KNEE ARTHROPLASTY Left 04/27/2012   Procedure: TOTAL KNEE ARTHROPLASTY;  Surgeon: Vickki Hearing, MD;  Location: [facility];  Service: Orthopedics;  Laterality: Left;   TRANSCAROTID ARTERY REVASCULARIZATION  Left  10/12/2021   Procedure: Left Transcarotid Artery Revascularization;  Surgeon: Maeola Harman, MD;  Location: [facility] OR;  Service: Vascular;  Laterality: Left;   UMBILICAL HERNIA REPAIR  1993   VISCERAL ANGIOGRAPHY N/A 07/17/2020   Procedure: mesenteric ANGIOGRAPHY;  Surgeon: Maeola Harman, MD;  Location: [facility] INVASIVE CV LAB;  Service: Cardiovascular;  Laterality: N/A;    Social History:   reports that he has been smoking cigarettes. He has a 15 pack-year smoking history. He quit smokeless tobacco use about 4 years ago.  His smokeless tobacco use included chew. He reports that he does not currently use alcohol. He reports that he does not use drugs.   Family History:  His family history includes Cancer in his father, maternal grandmother, and paternal grandmother; Cancer - Lung in his father and maternal grandfather; Colon cancer in some other family members; Colon polyps in his mother; Coronary artery disease in his father; Dementia in his mother; Diabetes in his maternal grandfather; Emphysema in his maternal grandmother; Heart attack in his father and paternal grandfather; Heart disease in his brother; Hypertension in his father and mother; Osteoporosis in his mother.   Allergies No Known Allergies   Home Medications  Prior to Admission medications   Medication Sig Start Date End Date Taking? Authorizing Provider  benzonatate (TESSALON) 200 MG capsule Take 1 capsule (200 mg total) by mouth 2 (two) times daily as needed for cough. 02/10/23   [name], MD  albuterol (PROVENTIL) (2.5 MG/3ML) 0.083% nebulizer solution Take 3 mLs (2.5 mg total) by nebulization every 6 (six) hours as needed for wheezing or shortness of breath. 09/23/22   [name], MD  albuterol (VENTOLIN HFA) 108 (90 Base) MCG/ACT inhaler Inhale 2 puffs into the lungs every 6 (six) hours as needed for wheezing or shortness of breath. 08/09/21   Donell Beers, FNP  aspirin EC 81 MG tablet Take 1  tablet (81 mg total) by mouth daily at 6 (six) AM. Swallow whole. 10/14/21  Rhyne, Samantha J, PA-C  carvedilol (COREG) 25 MG tablet Take 1 tablet (25 mg total) by mouth 2 (two) times daily. 11/27/22   Sharlene Dory, NP  Continuous Blood Gluc Sensor (FREESTYLE LIBRE 3 SENSOR) MISC 1 each by Does not apply route every 14 (fourteen) days. Place 1 sensor on the skin every 14 days. Use to check glucose continuously 12/19/21   [name], MD  dicyclomine (BENTYL) 20 MG tablet TAKE 1 TABLET BY MOUTH 4 TIMES DAILY AS NEEDED FOR SPASMS 09/16/22   [name], MD  diphenhydrAMINE (BENADRYL) 25 MG tablet Take 25 mg by mouth daily as needed for allergies.     [provider]  divalproex (DEPAKOTE ER) 500 MG 24 hr tablet Take 1 tablet (500 mg total) by mouth daily. 11/13/22   Ihor Austin, NP  fenofibrate 160 MG tablet Take 1 tablet (160 mg total) by mouth daily. 01/02/23   Sharlene Dory, NP  Glucagon (GVOKE HYPOPEN 2-PACK) 1 MG/0.2ML SOAJ Inject 1 mg into the skin as needed (hypoglycemia). 12/11/21   [name], MD  insulin glargine (LANTUS) 100 UNIT/ML injection Inject 0.24 mLs (24 Units total) into the skin daily. 04/04/22   [name], MD  lansoprazole (PREVACID) 30 MG capsule TAKE 1 CAPSULE BY MOUTH TWICE DAILY BEFORE A MEAL 07/09/22   Rourk, Gerrit Friends, MD  lisinopril (ZESTRIL) 20 MG tablet Take 1 tablet (20 mg total) by mouth 2 (two) times daily. 01/16/23   Sharlene Dory, NP  metFORMIN (GLUCOPHAGE) 1000 MG tablet TAKE 1 TABLET BY MOUTH TWICE DAILY WITH A MEAL 01/27/23   [name], MD  nitroGLYCERIN (NITROSTAT) 0.4 MG SL tablet Place 1 tablet (0.4 mg total) under the tongue every 5 (five) minutes as needed for chest pain (Do not excieed more than 3 tablets in 15 minutes.). 03/11/22 11/20/22  Sharlene Dory, NP  NOVOLOG 100 UNIT/ML injection Inject 14 Units into the skin 3 (three) times daily with meals. 01/08/22   [name], MD  rosuvastatin (CRESTOR) 40 MG tablet  Take 1 tablet by mouth once daily 01/01/23   Jonelle Sidle, MD  Semaglutide, 1 MG/DOSE, 4 MG/3ML SOPN Inject 1 mg as directed once a week. 12/17/22   [name], MD    Critical care time: 47    CRITICAL CARE Performed by: Dahlia Byes  Total critical care time: 65 minutes  Critical care time was exclusive of separately billable procedures and treating other patients.  Critical care was necessary to treat or prevent imminent or life-threatening deterioration.  Critical care was time spent personally by me on the following activities: development of treatment plan with patient and/or surrogate as well as nursing, discussions with consultants, evaluation of patient's response to treatment, examination of patient, obtaining history from patient or surrogate, ordering and performing treatments and interventions, ordering and review of laboratory studies, ordering and review of radiographic studies, pulse oximetry and re-evaluation of patient's condition.

## 2023-02-12 ENCOUNTER — Inpatient Hospital Stay (HOSPITAL_COMMUNITY): Payer: Medicare PPO

## 2023-02-12 ENCOUNTER — Other Ambulatory Visit (HOSPITAL_COMMUNITY): Payer: Self-pay

## 2023-02-12 DIAGNOSIS — J9601 Acute respiratory failure with hypoxia: Secondary | ICD-10-CM | POA: Diagnosis not present

## 2023-02-12 DIAGNOSIS — J101 Influenza due to other identified influenza virus with other respiratory manifestations: Secondary | ICD-10-CM | POA: Diagnosis not present

## 2023-02-12 DIAGNOSIS — J441 Chronic obstructive pulmonary disease with (acute) exacerbation: Secondary | ICD-10-CM | POA: Diagnosis not present

## 2023-02-12 DIAGNOSIS — E1165 Type 2 diabetes mellitus with hyperglycemia: Secondary | ICD-10-CM | POA: Diagnosis not present

## 2023-02-12 LAB — BLOOD CULTURE ID PANEL (REFLEXED) - BCID2

## 2023-02-12 LAB — BASIC METABOLIC PANEL
Anion gap: 11 (ref 5–15)
BUN: 23 mg/dL (ref 8–23)
CO2: 21 mmol/L — ABNORMAL LOW (ref 22–32)
Calcium: 8 mg/dL — ABNORMAL LOW (ref 8.9–10.3)
Chloride: 105 mmol/L (ref 98–111)
Creatinine, Ser: 0.96 mg/dL (ref 0.61–1.24)
GFR, Estimated: >60 mL/min (ref >60)
Glucose, Bld: 180 mg/dL — ABNORMAL HIGH (ref 70–99)
Potassium: 4.4 mmol/L (ref 3.5–5.1)
Sodium: 137 mmol/L (ref 135–145)

## 2023-02-12 LAB — CBC
HCT: 41.8 % (ref 39.0–52.0)
Hemoglobin: 13.4 g/dL (ref 13.0–17.0)
MCH: 31.7 pg (ref 26.0–34.0)
MCHC: 32.1 g/dL (ref 30.0–36.0)
MCV: 98.8 fL (ref 80.0–100.0)
Platelets: 175 10*3/uL (ref 150–400)
RBC: 4.23 MIL/uL (ref 4.22–5.81)
RDW: 13.2 % (ref 11.5–15.5)
WBC: 10.3 10*3/uL (ref 4.0–10.5)
nRBC: 0 % (ref 0.0–0.2)

## 2023-02-12 LAB — MAGNESIUM: Magnesium: 2.3 mg/dL (ref 1.7–2.4)

## 2023-02-12 LAB — POCT I-STAT 7, (LYTES, BLD GAS, ICA,H+H)
Acid-Base Excess: 3 mmol/L — ABNORMAL HIGH (ref 0.0–2.0)
Bicarbonate: 31 mmol/L — ABNORMAL HIGH (ref 20.0–28.0)
Calcium, Ion: 1.23 mmol/L (ref 1.15–1.40)
HCT: 40 % (ref 39.0–52.0)
Hemoglobin: 13.6 g/dL (ref 13.0–17.0)
O2 Saturation: 99 %
Patient temperature: 37.3
Potassium: 4.6 mmol/L (ref 3.5–5.1)
Sodium: 138 mmol/L (ref 135–145)
TCO2: 33 mmol/L — ABNORMAL HIGH (ref 22–32)
pCO2 arterial: 60.6 mm[Hg] — ABNORMAL HIGH (ref 32–48)
pH, Arterial: 7.319 — ABNORMAL LOW (ref 7.35–7.45)
pO2, Arterial: 134 mm[Hg] — ABNORMAL HIGH (ref 83–108)

## 2023-02-12 LAB — GLUCOSE, CAPILLARY
Glucose-Capillary: 108 mg/dL — ABNORMAL HIGH (ref 70–99)
Glucose-Capillary: 148 mg/dL — ABNORMAL HIGH (ref 70–99)
Glucose-Capillary: 156 mg/dL — ABNORMAL HIGH (ref 70–99)
Glucose-Capillary: 157 mg/dL — ABNORMAL HIGH (ref 70–99)
Glucose-Capillary: 166 mg/dL — ABNORMAL HIGH (ref 70–99)
Glucose-Capillary: 199 mg/dL — ABNORMAL HIGH (ref 70–99)
Glucose-Capillary: 66 mg/dL — ABNORMAL LOW (ref 70–99)

## 2023-02-12 LAB — LACTIC ACID, PLASMA: Lactic Acid, Venous: 2.2 mmol/L (ref 0.5–1.9)

## 2023-02-12 MED ORDER — ONDANSETRON HCL 4 MG/2ML IJ SOLN
4.0000 mg | Freq: Once | INTRAMUSCULAR | Status: AC
  Administered 2023-02-12: 4 mg via INTRAVENOUS
  Filled 2023-02-12: qty 2

## 2023-02-12 MED ORDER — OSELTAMIVIR PHOSPHATE 75 MG PO CAPS
75.0000 mg | ORAL_CAPSULE | Freq: Two times a day (BID) | ORAL | Status: DC
  Administered 2023-02-12 – 2023-02-15 (×6): 75 mg via ORAL
  Filled 2023-02-12 (×8): qty 1

## 2023-02-12 MED ORDER — BUDESONIDE 0.5 MG/2ML IN SUSP
0.5000 mg | Freq: Two times a day (BID) | RESPIRATORY_TRACT | Status: DC
  Administered 2023-02-12 – 2023-02-15 (×7): 0.5 mg via RESPIRATORY_TRACT
  Filled 2023-02-12 (×7): qty 2

## 2023-02-12 MED ORDER — ORAL CARE MOUTH RINSE
15.0000 mL | OROMUCOSAL | Status: DC
  Administered 2023-02-12 – 2023-02-15 (×6): 15 mL via OROMUCOSAL

## 2023-02-12 MED ORDER — ORAL CARE MOUTH RINSE: 15.0000 mL | OROMUCOSAL | Status: DC | PRN

## 2023-02-12 MED ORDER — ACETAMINOPHEN 325 MG PO TABS
650.0000 mg | ORAL_TABLET | ORAL | Status: DC | PRN
  Administered 2023-02-12 – 2023-02-13 (×2): 650 mg via ORAL
  Filled 2023-02-12 (×2): qty 2

## 2023-02-12 MED ORDER — IPRATROPIUM-ALBUTEROL 0.5-2.5 (3) MG/3ML IN SOLN
3.0000 mL | Freq: Four times a day (QID) | RESPIRATORY_TRACT | Status: DC
  Administered 2023-02-12 – 2023-02-14 (×8): 3 mL via RESPIRATORY_TRACT
  Filled 2023-02-12 (×9): qty 3

## 2023-02-12 MED ORDER — INSULIN ASPART 100 UNIT/ML IJ SOLN
0.0000 [IU] | Freq: Three times a day (TID) | INTRAMUSCULAR | Status: DC
  Administered 2023-02-12: 3 [IU] via SUBCUTANEOUS
  Administered 2023-02-13: 2 [IU] via SUBCUTANEOUS
  Administered 2023-02-13 – 2023-02-14 (×2): 3 [IU] via SUBCUTANEOUS
  Administered 2023-02-14 – 2023-02-15 (×3): 5 [IU] via SUBCUTANEOUS
  Administered 2023-02-15: 3 [IU] via SUBCUTANEOUS

## 2023-02-12 MED ORDER — DIVALPROEX SODIUM ER 500 MG PO TB24
500.0000 mg | ORAL_TABLET | Freq: Every day | ORAL | Status: DC
  Administered 2023-02-13 – 2023-02-15 (×3): 500 mg via ORAL
  Filled 2023-02-12 (×3): qty 1

## 2023-02-12 MED ORDER — INSULIN ASPART 100 UNIT/ML IJ SOLN: 0.0000 [IU] | Freq: Every day | INTRAMUSCULAR | Status: DC

## 2023-02-12 MED ORDER — ASPIRIN 81 MG PO CHEW
81.0000 mg | CHEWABLE_TABLET | Freq: Every day | ORAL | Status: DC
  Administered 2023-02-13 – 2023-02-15 (×3): 81 mg via ORAL
  Filled 2023-02-12 (×3): qty 1

## 2023-02-12 MED ORDER — FUROSEMIDE 10 MG/ML IJ SOLN
20.0000 mg | Freq: Once | INTRAMUSCULAR | Status: AC
  Administered 2023-02-12: 20 mg via INTRAVENOUS
  Filled 2023-02-12: qty 2

## 2023-02-12 MED ORDER — ONDANSETRON HCL 4 MG PO TABS: 4.0000 mg | ORAL_TABLET | Freq: Once | ORAL | Status: DC

## 2023-02-12 MED ORDER — PREDNISONE 20 MG PO TABS
40.0000 mg | ORAL_TABLET | Freq: Every day | ORAL | Status: DC
  Administered 2023-02-13 – 2023-02-15 (×3): 40 mg via ORAL
  Filled 2023-02-12 (×3): qty 2

## 2023-02-12 NOTE — Procedures (Addendum)
Extubation Procedure Note  Patient Details:   Name: Jimmy Duran DOB: 1956-01-09 MRN: [id number]   Airway Documentation:    Vent end date: 02/12/23 Vent end time: 1028   Evaluation  O2 sats: stable throughout Complications: No apparent complications Patient did tolerate procedure well. Bilateral Breath Sounds: Diminished   Yes  Pt extubated to 2L [place], pt tolerated well. Cuff leak present, no stridor noted, RN at bedside, CCM at bedside, RT will monitor as needed.    Thornell Mule 02/12/2023, 10:44 AM

## 2023-02-12 NOTE — Discharge Instructions (Addendum)
[place] assistance program website below or call  [phone] [email or web address]

## 2023-02-12 NOTE — Progress Notes (Addendum)
NAME:  Jimmy Duran, MRN:  [id number], DOB:  [birth year], LOS: 1 ADMISSION DATE:  02/11/2023, CONSULTATION DATE:  12/11 REFERRING MD:  EDP CHIEF COMPLAINT:  Hypoxia   History of Present Illness:  Jimmy Duran is a 67 y/o gentleman with a PMH significant for HTN, CAD, NSTEMI, PAD, COPD,  TIA/Stroke, Seizures, T2DM, who presented to the hospital with several days of SOB, cough, decreased appetite. He had been around a family member with the flu recently. He was smoking until 4 days ago. Per PTA med list, he was not on bronchodilators as an outpatient. He was hypoxic in the ED and initially started on 2L [place] but had persistent desaturation and confusion. He was upgraded to BiPAP but over about an hour continued to deteriorate with worse confusion and hypoxia into the 60's, prompting intubation in the ED. VBG initially with 7.08/107/34. Post-intubation he required vasopressors despite 2 liters IVF bolus to tolerate sedation. Viral panel + Influenza A   PCCM consulted for transfer to [facility] for admission Pertinent  Medical History  COPD CAD/NSTEMI DM2 AAA TIA/CVA Seizures PAD GERD HTN Tobacco abuse Significant Hospital Events: Including procedures, antibiotic start and stop dates in addition to other pertinent events   12/10: Presented to [facility] for hypoxia, admitted to ICU Interim History / Subjective:  Intubated on initial exam.  Examined after extubation, pt reports doing better and denying any acute concerns.   Objective   Blood pressure 102/70, pulse 84, temperature 98.7 F (37.1 C), temperature source Oral, resp. rate (!) 24, height 5\' 7"  (1.702 m), weight 61.9 kg, SpO2 98%.    Vent Mode: PRVC FiO2 (%):  [30 %-60 %] 30 % Set Rate:  [22 bmp-24 bmp] 24 bmp Vt Set:  [530 mL] 530 mL PEEP:  [5 cmH20] 5 cmH20 Plateau Pressure:  [20 cmH20-25 cmH20] 20 cmH20   Intake/Output Summary (Last 24 hours) at 02/12/2023 0743 Last data filed at 02/12/2023 0700 Gross per 24 hour  Intake 692.3 ml   Output 550 ml  Net 142.3 ml   Filed Weights   02/11/23 1438 02/11/23 2011 02/12/23 0500  Weight: 60.3 kg 61.9 kg 61.9 kg    Examination: General: intubated and sedated HENT: intubated Lungs: CTAB, good air movement Cardiovascular: normal rate and rhythm, radial pulse present Abdomen: No TTP, normal bowel sounds Extremities: no asymmetry Neuro: follows commands GU: foley in place  Labs   CBC: Recent Labs  Lab 02/11/23 1518 02/11/23 2117 02/12/23 0058  WBC 7.4  --  10.3  NEUTROABS 6.0  --   --   HGB 13.8 12.9* 13.4  HCT 41.8 38.0* 41.8  MCV 96.1  --  98.8  PLT 203  --  175   Basic Metabolic Panel: Recent Labs  Lab 02/11/23 1518 02/11/23 2108 02/11/23 2117 02/12/23 0058  NA 135  --  137 137  K 4.1  --  4.4 4.4  CL 98  --   --  105  CO2 26  --   --  21*  GLUCOSE 191*  --   --  180*  BUN 23  --   --  23  CREATININE 0.96  --   --  0.96  CALCIUM 9.0  --   --  8.0*  MG  --  2.5*  --  2.3  PHOS  --  3.4  --   --    GFR: Estimated Creatinine Clearance: 67.2 mL/min (by C-G formula based on SCr of 0.96 mg/dL). Recent Labs  Lab 02/11/23  1518 02/11/23 2108 02/12/23 0058  PROCALCITON  --  0.19  --   WBC 7.4  --  10.3  LATICACIDVEN  --  1.7 2.2*   Liver Function Tests: Recent Labs  Lab 02/11/23 1518  AST 34  ALT 16  ALKPHOS 37*  BILITOT 0.5  PROT 7.1  ALBUMIN 3.5   No results for input(s): "LIPASE", "AMYLASE" in the last 168 hours. No results for input(s): "AMMONIA" in the last 168 hours. ABG    Component Value Date/Time   PHART 7.307 (L) 02/11/2023 2117   PCO2ART 47.3 02/11/2023 2117   PO2ART 283 (H) 02/11/2023 2117   HCO3 23.7 02/11/2023 2117   TCO2 25 02/11/2023 2117   ACIDBASEDEF 3.0 (H) 02/11/2023 2117   O2SAT 100 02/11/2023 2117    Coagulation Profile: No results for input(s): "INR", "PROTIME" in the last 168 hours. Cardiac Enzymes: No results for input(s): "CKTOTAL", "CKMB", "CKMBINDEX", "TROPONINI" in the last 168  hours. HbA1C: Hemoglobin A1C  Date/Time Value Ref Range Status  12/23/2020 12:00 AM 9.5  Final   Hgb A1c MFr Bld  Date/Time Value Ref Range Status  02/11/2023 09:21 PM 8.0 (H) 4.8 - 5.6 % Final    Comment:    (NOTE) Pre diabetes:          5.7%-6.4%  Diabetes:              >6.4%  Glycemic control for   <7.0% adults with diabetes   10/13/2022 09:18 AM 8.6 (H) 4.8 - 5.6 % Final    Comment:    (NOTE)         Prediabetes: 5.7 - 6.4         Diabetes: >6.4         Glycemic control for adults with diabetes: <7.0    CBG: Recent Labs  Lab 02/11/23 2016 02/11/23 2153 02/12/23 0018 02/12/23 0313  GLUCAP 202* 207* 199* 157*      Consults   Assessment & Plan:  Principal Problem:   Respiratory failure (HCC) Active Problems:   Acute respiratory failure with hypoxia (HCC)   Influenza A  Acute hypoxic Respiratory Failure 2/2 to Influenza A COPD exacerbation Pt presented to ED with 4 days of worsening URI symptoms. Resp panel positive for Flu A. Given severe hypoxia, pt intubated in the ED. Pt started on Tamiflu 75 mg for 5 days. Nebuilizers and solumedrol. Suspect he has COPD exacerbation 2/2 to viral infection. Will transition to oral steroids and 3 days of azithromycin. Extubated today. Will continue current measures. Will observe today, can likely discharge tomorrow.   Seizure Disorder Chronic. On Valproate. Continued here.   HTN Chronic. Holding home BP meds. Can restart if pt is hypertensive today.   HLD Chronic. On statin, fibrates and aspirin. These medications were restarted.   T2DM Chronic. mSSI here. At home on metformin, ozempic, and insulin 24 units.  If CBG elevated, can start long acting insulin.  GERD Chronic. Will restart home PPI.   Best Practice (right click and "Reselect all SmartList Selections" daily)  Diet/type: NPO DVT prophylaxis: LMWH GI prophylaxis: H2B Lines: N/A Foley:  Yes, and it is still needed Continuous: Levo, prop and  fentanyl Code Status:  full code Last date of multidisciplinary goals of care discussion [12/11]  Home Medications  Prior to Admission medications   Medication Sig Start Date End Date Taking? Authorizing Provider  albuterol (PROVENTIL) (2.5 MG/3ML) 0.083% nebulizer solution Take 3 mLs (2.5 mg total) by nebulization every 6 (six) hours as  needed for wheezing or shortness of breath. 09/23/22  Yes [name], MD  aspirin EC 81 MG tablet Take 1 tablet (81 mg total) by mouth daily at 6 (six) AM. Swallow whole. 10/14/21  Yes Rhyne, Samantha J, PA-C  carvedilol (COREG) 25 MG tablet Take 1 tablet (25 mg total) by mouth 2 (two) times daily. 11/27/22  Yes Sharlene Dory, NP  diphenhydrAMINE (BENADRYL) 25 MG tablet Take 25 mg by mouth daily as needed for allergies.    Yes [provider]  divalproex (DEPAKOTE ER) 500 MG 24 hr tablet Take 1 tablet (500 mg total) by mouth daily. 11/13/22  Yes Ihor Austin, NP  fenofibrate 160 MG tablet Take 1 tablet (160 mg total) by mouth daily. 01/02/23  Yes Sharlene Dory, NP  Glucagon (GVOKE HYPOPEN 2-PACK) 1 MG/0.2ML SOAJ Inject 1 mg into the skin as needed (hypoglycemia). 12/11/21  Yes [name], MD  insulin glargine (LANTUS) 100 UNIT/ML injection Inject 0.24 mLs (24 Units total) into the skin daily. 04/04/22  Yes [name], MD  lansoprazole (PREVACID) 30 MG capsule TAKE 1 CAPSULE BY MOUTH TWICE DAILY BEFORE A MEAL 07/09/22  Yes Rourk, Gerrit Friends, MD  lisinopril (ZESTRIL) 20 MG tablet Take 1 tablet (20 mg total) by mouth 2 (two) times daily. 01/16/23  Yes Sharlene Dory, NP  metFORMIN (GLUCOPHAGE) 1000 MG tablet TAKE 1 TABLET BY MOUTH TWICE DAILY WITH A MEAL 01/27/23  Yes [name], MD  nitroGLYCERIN (NITROSTAT) 0.4 MG SL tablet Place 1 tablet (0.4 mg total) under the tongue every 5 (five) minutes as needed for chest pain (Do not excieed more than 3 tablets in 15 minutes.). 03/11/22 02/11/23 Yes Sharlene Dory, NP  NOVOLOG 100 UNIT/ML  injection Inject 14 Units into the skin 3 (three) times daily with meals. 01/08/22  Yes [name], MD  rosuvastatin (CRESTOR) 40 MG tablet Take 1 tablet by mouth once daily 01/01/23  Yes Jonelle Sidle, MD  Semaglutide, 1 MG/DOSE, 4 MG/3ML SOPN Inject 1 mg as directed once a week. 12/17/22  Yes [name], MD  Continuous Blood Gluc Sensor (FREESTYLE LIBRE 3 SENSOR) MISC 1 each by Does not apply route every 14 (fourteen) days. Place 1 sensor on the skin every 14 days. Use to check glucose continuously 12/19/21   [name], MD    Allergies No Known Allergies

## 2023-02-12 NOTE — Inpatient Diabetes Management (Addendum)
Inpatient Diabetes Program Recommendations  AACE/ADA: New Consensus Statement on Inpatient Glycemic Control (2015)  Target Ranges:  Prepandial:   less than 140 mg/dL      Peak postprandial:   less than 180 mg/dL (1-2 hours)      Critically ill patients:  140 - 180 mg/dL   Lab Results  Component Value Date   GLUCAP 166 (H) 02/12/2023   HGBA1C 8.0 (H) 02/11/2023    Review of Glycemic Control  Latest Reference Range & Units 02/11/23 20:16 02/11/23 21:53 02/12/23 00:18 02/12/23 03:13 02/12/23 07:54 02/12/23 11:30  Glucose-Capillary 70 - 99 mg/dL 643 (H) 329 (H) 518 (H) 157 (H) 148 (H) 166 (H)   Diabetes history: DM  Outpatient Diabetes medications:  Lantus 24 units daily Metformin 1000 mg bid Novolog 14 units tid with meals Semaglutide 1 mg weekly Current orders for Inpatient glycemic control:  Novolog moderate tid with meals and HS Prednisone 40 mg daily  Inpatient Diabetes Program Recommendations:    Note patient extubated and diet orders in place.   If CBG's >180 mg/dL, Consider adding 1/2 of home basal dose Semglee 12 units daily and Novolog 3 units tid with meals (hold if patient eats less than 50% or NPO).   Thanks,  Beryl Meager, RN, BC-ADM Inpatient Diabetes Coordinator Pager [phone]  (8a-5p)

## 2023-02-12 NOTE — Progress Notes (Signed)
PHARMACY - PHYSICIAN COMMUNICATION CRITICAL VALUE ALERT - BLOOD CULTURE IDENTIFICATION (BCID)  Jimmy Duran is an 67 y.o. male who presented to [facility] on 02/11/2023 with a chief complaint of SOB. Pt positive for flu  Assessment:  1/3 bottles with staph epi - likely contaminant.  Name of physician (or Provider) Contacted: Dr. Warrick Parisian  Current antibiotics: Azithromycin  Changes to prescribed antibiotics recommended:  No additional antibiotics needed  Results for orders placed or performed during the hospital encounter of 02/11/23  Blood Culture ID Panel (Reflexed) (Collected: 02/11/2023  9:21 PM)  Result Value Ref Range   Enterococcus faecalis NOT DETECTED NOT DETECTED   Enterococcus Faecium NOT DETECTED NOT DETECTED   Listeria monocytogenes NOT DETECTED NOT DETECTED   Staphylococcus species DETECTED (A) NOT DETECTED   Staphylococcus aureus (BCID) NOT DETECTED NOT DETECTED   Staphylococcus epidermidis DETECTED (A) NOT DETECTED   Staphylococcus lugdunensis NOT DETECTED NOT DETECTED   Streptococcus species NOT DETECTED NOT DETECTED   Streptococcus agalactiae NOT DETECTED NOT DETECTED   Streptococcus pneumoniae NOT DETECTED NOT DETECTED   Streptococcus pyogenes NOT DETECTED NOT DETECTED   A.calcoaceticus-baumannii NOT DETECTED NOT DETECTED   Bacteroides fragilis NOT DETECTED NOT DETECTED   Enterobacterales NOT DETECTED NOT DETECTED   Enterobacter cloacae complex NOT DETECTED NOT DETECTED   Escherichia coli NOT DETECTED NOT DETECTED   Klebsiella aerogenes NOT DETECTED NOT DETECTED   Klebsiella oxytoca NOT DETECTED NOT DETECTED   Klebsiella pneumoniae NOT DETECTED NOT DETECTED   Proteus species NOT DETECTED NOT DETECTED   Salmonella species NOT DETECTED NOT DETECTED   Serratia marcescens NOT DETECTED NOT DETECTED   Haemophilus influenzae NOT DETECTED NOT DETECTED   Neisseria meningitidis NOT DETECTED NOT DETECTED   Pseudomonas aeruginosa NOT DETECTED NOT DETECTED    Stenotrophomonas maltophilia NOT DETECTED NOT DETECTED   Candida albicans NOT DETECTED NOT DETECTED   Candida auris NOT DETECTED NOT DETECTED   Candida glabrata NOT DETECTED NOT DETECTED   Candida krusei NOT DETECTED NOT DETECTED   Candida parapsilosis NOT DETECTED NOT DETECTED   Candida tropicalis NOT DETECTED NOT DETECTED   Cryptococcus neoformans/gattii NOT DETECTED NOT DETECTED   Methicillin resistance mecA/C NOT DETECTED NOT DETECTED    Christoper Fabian, PharmD, BCPS Please see amion for complete clinical pharmacist phone list 02/12/2023  8:23 PM

## 2023-02-12 NOTE — Progress Notes (Signed)
An USGPIV (ultrasound guided PIV) has been placed for short-term vasopressor infusion. A correctly placed ivWatch must be used when administering vasopressors. Should this treatment be needed beyond 72 hours, central line access should be obtained.  It will be the responsibility of the bedside nurse to follow best practice to prevent extravasations.   

## 2023-02-13 ENCOUNTER — Institutional Professional Consult (permissible substitution) (HOSPITAL_BASED_OUTPATIENT_CLINIC_OR_DEPARTMENT_OTHER): Payer: Medicare PPO | Admitting: Family

## 2023-02-13 DIAGNOSIS — J9601 Acute respiratory failure with hypoxia: Secondary | ICD-10-CM | POA: Diagnosis not present

## 2023-02-13 DIAGNOSIS — J441 Chronic obstructive pulmonary disease with (acute) exacerbation: Secondary | ICD-10-CM | POA: Diagnosis not present

## 2023-02-13 LAB — CULTURE, RESPIRATORY W GRAM STAIN

## 2023-02-13 LAB — CBC WITH DIFFERENTIAL/PLATELET
Abs Immature Granulocytes: 0.04 10*3/uL (ref 0.00–0.07)
Basophils Absolute: 0 10*3/uL (ref 0.0–0.1)
Basophils Relative: 0 %
Eosinophils Absolute: 0 10*3/uL (ref 0.0–0.5)
Eosinophils Relative: 0 %
HCT: 38.6 % — ABNORMAL LOW (ref 39.0–52.0)
Hemoglobin: 12.6 g/dL — ABNORMAL LOW (ref 13.0–17.0)
Immature Granulocytes: 1 %
Lymphocytes Relative: 7 %
Lymphs Abs: 0.6 10*3/uL — ABNORMAL LOW (ref 0.7–4.0)
MCH: 31 pg (ref 26.0–34.0)
MCHC: 32.6 g/dL (ref 30.0–36.0)
MCV: 95.1 fL (ref 80.0–100.0)
Monocytes Absolute: 1.2 10*3/uL — ABNORMAL HIGH (ref 0.1–1.0)
Monocytes Relative: 15 %
Neutro Abs: 6.6 10*3/uL (ref 1.7–7.7)
Neutrophils Relative %: 77 %
Platelets: 219 10*3/uL (ref 150–400)
RBC: 4.06 MIL/uL — ABNORMAL LOW (ref 4.22–5.81)
RDW: 13.3 % (ref 11.5–15.5)
WBC: 8.4 10*3/uL (ref 4.0–10.5)
nRBC: 0 % (ref 0.0–0.2)

## 2023-02-13 LAB — BASIC METABOLIC PANEL
Anion gap: 10 (ref 5–15)
BUN: 22 mg/dL (ref 8–23)
CO2: 29 mmol/L (ref 22–32)
Calcium: 8.6 mg/dL — ABNORMAL LOW (ref 8.9–10.3)
Chloride: 99 mmol/L (ref 98–111)
Creatinine, Ser: 1.02 mg/dL (ref 0.61–1.24)
GFR, Estimated: >60 mL/min (ref >60)
Glucose, Bld: 129 mg/dL — ABNORMAL HIGH (ref 70–99)
Potassium: 3.8 mmol/L (ref 3.5–5.1)
Sodium: 138 mmol/L (ref 135–145)

## 2023-02-13 LAB — GLUCOSE, CAPILLARY
Glucose-Capillary: 125 mg/dL — ABNORMAL HIGH (ref 70–99)
Glucose-Capillary: 132 mg/dL — ABNORMAL HIGH (ref 70–99)
Glucose-Capillary: 194 mg/dL — ABNORMAL HIGH (ref 70–99)
Glucose-Capillary: 195 mg/dL — ABNORMAL HIGH (ref 70–99)

## 2023-02-13 LAB — CALCIUM, IONIZED: Calcium, Ionized, Serum: 4.5 mg/dL (ref 4.5–5.6)

## 2023-02-13 LAB — CULTURE, BLOOD (ROUTINE X 2)

## 2023-02-13 LAB — LEGIONELLA PNEUMOPHILA SEROGP 1 UR AG: L. pneumophila Serogp 1 Ur Ag: NEGATIVE

## 2023-02-13 MED ORDER — LISINOPRIL 20 MG PO TABS
20.0000 mg | ORAL_TABLET | Freq: Two times a day (BID) | ORAL | Status: DC
  Administered 2023-02-13 – 2023-02-15 (×5): 20 mg via ORAL
  Filled 2023-02-13 (×4): qty 1
  Filled 2023-02-13: qty 2

## 2023-02-13 MED ORDER — GLUCERNA SHAKE PO LIQD
237.0000 mL | Freq: Three times a day (TID) | ORAL | Status: DC
  Administered 2023-02-13 – 2023-02-15 (×4): 237 mL via ORAL

## 2023-02-13 MED ORDER — AMOXICILLIN 500 MG PO CAPS
1000.0000 mg | ORAL_CAPSULE | Freq: Three times a day (TID) | ORAL | Status: DC
  Administered 2023-02-13 – 2023-02-15 (×6): 1000 mg via ORAL
  Filled 2023-02-13 (×7): qty 2

## 2023-02-13 MED ORDER — ROSUVASTATIN CALCIUM 20 MG PO TABS
40.0000 mg | ORAL_TABLET | Freq: Every day | ORAL | Status: DC
Start: 2023-02-13 — End: 2023-02-15
  Administered 2023-02-13 – 2023-02-15 (×3): 40 mg via ORAL
  Filled 2023-02-13 (×3): qty 2

## 2023-02-13 MED ORDER — NICOTINE 21 MG/24HR TD PT24
21.0000 mg | MEDICATED_PATCH | Freq: Every day | TRANSDERMAL | Status: DC
  Filled 2023-02-13 (×3): qty 1

## 2023-02-13 MED ORDER — FENOFIBRATE 160 MG PO TABS
160.0000 mg | ORAL_TABLET | Freq: Every day | ORAL | Status: DC
Start: 2023-02-13 — End: 2023-02-15
  Administered 2023-02-13 – 2023-02-15 (×3): 160 mg via ORAL
  Filled 2023-02-13 (×3): qty 1

## 2023-02-13 MED ORDER — ESCITALOPRAM OXALATE 10 MG PO TABS
10.0000 mg | ORAL_TABLET | Freq: Every day | ORAL | Status: DC
  Administered 2023-02-13 – 2023-02-15 (×3): 10 mg via ORAL
  Filled 2023-02-13 (×3): qty 1

## 2023-02-13 MED ORDER — TRELEGY ELLIPTA 100-62.5-25 MCG/ACT IN AEPB: 1.0000 | INHALATION_SPRAY | Freq: Every day | RESPIRATORY_TRACT | 0 refills | Status: DC

## 2023-02-13 MED ORDER — PANTOPRAZOLE SODIUM 20 MG PO TBEC
20.0000 mg | DELAYED_RELEASE_TABLET | Freq: Every day | ORAL | Status: DC
  Administered 2023-02-13 – 2023-02-15 (×3): 20 mg via ORAL
  Filled 2023-02-13 (×3): qty 1

## 2023-02-13 NOTE — Evaluation (Signed)
Physical Therapy Evaluation Patient Details Name: Jimmy Duran MRN: [id number] DOB: [birth year] Today's Date: 02/13/2023  History of Present Illness  67 y.o. male admitted 02/11/23 with acute hypoxic respiratory failure secondary to flu. PMH includes COPD, TIA, PAD, AAA, DM2, HTN.   Clinical Impression  Pt presents with an overall decrease in functional mobility secondary to above. PTA, pt independent, active, lives with supportive family. Today, pt moving well, able to transfer and ambulate without DME at supervision-level; DOE 2-3/4 with SpO2 down to 85% on RA with activity. Pt with no follow-up PT needs, has necessary assist from family. If to remain admitted, will follow acutely to address established goals.     If plan is discharge home, recommend the following: Assist for transportation;Assistance with cooking/housework   Can travel by private vehicle    Yes    Equipment Recommendations None recommended by PT  Recommendations for Other Services       Functional Status Assessment Patient has had a recent decline in their functional status and demonstrates the ability to make significant improvements in function in a reasonable and predictable amount of time.     Precautions / Restrictions Precautions Precautions: Other (comment) Precaution Comments: watch SpO2 (does not wear baseline) Restrictions Weight Bearing Restrictions Per Provider Order: No      Mobility  Bed Mobility Overal bed mobility: Modified Independent                  Transfers Overall transfer level: Needs assistance Equipment used: None Transfers: Sit to/from Stand Sit to Stand: Contact guard assist, Supervision           General transfer comment: initial CGA for balance standing from EOB without DME; progressing to sit<>stand from recliner at supervision-level    Ambulation/Gait Ambulation/Gait assistance: Contact guard assist, Supervision Gait Distance (Feet): 150 Feet Assistive  device: None   Gait velocity: Decreased     General Gait Details: initial pivotal steps from bed>recliner (~6') without DME, CGA for balance/lines; hallway ambulation without DME, progressing to supervision for safety/lines, no overt instability or LOB, pt occasionally reaching to furniture for support. DOE 2-3/4 with SpO2 down to 85% on RA  Stairs            Wheelchair Mobility     Tilt Bed    Modified Rankin (Stroke Patients Only)       Balance Overall balance assessment: Needs assistance Sitting-balance support: No upper extremity supported, Feet supported Sitting balance-Leahy Scale: Good     Standing balance support: No upper extremity supported, During functional activity Standing balance-Leahy Scale: Good Standing balance comment: stability improving as session progressed; pt able to void in urinal while standing without assist                             Pertinent Vitals/Pain Pain Assessment Pain Assessment: No/denies pain    Home Living Family/patient expects to be discharged to:: Private residence Living Arrangements: Spouse/significant other;Children Available Help at Discharge: Family;Available 24 hours/day Type of Home: House Home Access: Level entry       Home Layout: One level Home Equipment: Agricultural consultant (2 wheels);Shower seat Additional Comments: wife and adult children able to assist as needed    Prior Function Prior Level of Function : Independent/Modified Independent;Driving             Mobility Comments: independent without DME; 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, building wells, Catering manager  Extremity/Trunk Assessment   Upper Extremity Assessment Upper Extremity Assessment: Overall WFL for tasks assessed    Lower Extremity Assessment Lower Extremity Assessment: Overall WFL for tasks assessed       Communication   Communication Communication: No apparent difficulties  Cognition  Arousal: Alert Behavior During Therapy: WFL for tasks assessed/performed Overall Cognitive Status: Within Functional Limits for tasks assessed                                          General Comments General comments (skin integrity, edema, etc.): SpO2 85% on RA with activity; RN notified of need for ambulatory saturation test later after pt recovers. educ re: POC, activity recommendations, pulmonary hygiene, importance of mobility, discharge needs including potential for supplemental O2 which is MD decision    Exercises     Assessment/Plan    PT Assessment Patient needs continued PT services  PT Problem List Decreased activity tolerance;Decreased balance;Decreased mobility;Cardiopulmonary status limiting activity       PT Treatment Interventions DME instruction;Gait training;Stair training;Functional mobility training;Therapeutic activities;Therapeutic exercise;Balance training;Patient/family education    PT Goals (Current goals can be found in the Care Plan section)  Acute Rehab PT Goals Patient Stated Goal: return home PT Goal Formulation: With patient Time For Goal Achievement: 02/27/23 Potential to Achieve Goals: Good    Frequency Min 1X/week     Co-evaluation               AM-PAC PT "6 Clicks" Mobility  Outcome Measure Help needed turning from your back to your side while in a flat bed without using bedrails?: None Help needed moving from lying on your back to sitting on the side of a flat bed without using bedrails?: None Help needed moving to and from a bed to a chair (including a wheelchair)?: A Little Help needed standing up from a chair using your arms (e.g., wheelchair or bedside chair)?: A Little Help needed to walk in hospital room?: A Little Help needed climbing 3-5 steps with a railing? : A Little 6 Click Score: 20    End of Session Equipment Utilized During Treatment: Gait belt Activity Tolerance: Patient tolerated treatment  well Patient left: in chair;with call bell/phone within reach;with chair alarm set Nurse Communication: Mobility status PT Visit Diagnosis: Other abnormalities of gait and mobility (R26.89)    Time: 4098-1191 PT Time Calculation (min) (ACUTE ONLY): 25 min   Charges:   PT Evaluation $PT Eval Moderate Complexity: 1 Mod PT Treatments $Gait Training: 8-22 mins PT General Charges $$ ACUTE PT VISIT: 1 Visit       Ina Homes, PT, DPT Acute Rehabilitation Services  Personal: Secure Chat Rehab Office: [phone]  Malachy Chamber 02/13/2023, 10:18 AM

## 2023-02-13 NOTE — TOC Initial Note (Signed)
Transition of Care [facility]) - Initial/Assessment Note    Patient Details  Name: Jimmy Duran MRN: [id number] Date of Birth: Jul 30, 1955  [facility]) CM/SW Contact:    Harriet Masson, RN Phone Number: 02/13/2023, 1:56 PM  Clinical Narrative:                 Spoke to patient regarding [employer] financial assistance.Left Form patient need to complete and patient is aware of all the information he need to send in with that form to apply for assistance.  TOC will continue to follow for needs.    Expected Discharge Plan: Home/Self Care Barriers to Discharge: Continued Medical Work up   Patient Goals and CMS Choice Patient states their goals for this hospitalization and ongoing recovery are:: return home          Expected Discharge Plan and Services       Living arrangements for the past 2 months: Single Family Home                                      Prior Living Arrangements/Services Living arrangements for the past 2 months: Single Family Home Lives with:: Spouse Patient language and need for interpreter reviewed:: Yes Do you feel safe going back to the place where you live?: Yes      Need for Family Participation in Patient Care: Yes (Comment) Care giver support system in place?: Yes (comment)   Criminal Activity/Legal Involvement Pertinent to Current Situation/Hospitalization: No - Comment as needed  Activities of Daily Living      Permission Sought/Granted                  Emotional Assessment Appearance:: Appears stated age Attitude/Demeanor/Rapport: Engaged Affect (typically observed): Accepting Orientation: : Oriented to Situation, Oriented to  Time, Oriented to Place, Oriented to Self Alcohol / Substance Use: Not Applicable Psych Involvement: No (comment)  Admission diagnosis:  Respiratory failure (HCC) [J96.90] Acute respiratory failure with hypoxia (HCC) [J96.01] Patient Active Problem List   Diagnosis Date Noted    Respiratory failure (HCC) 02/11/2023   Acute respiratory failure with hypoxia (HCC) 02/11/2023   Influenza A 02/11/2023   Seizure, late effect of stroke (HCC) 01/21/2023   Focal seizure (HCC) 10/15/2022   Protein-calorie malnutrition, severe 10/14/2022   Stroke determined by clinical assessment (HCC) 10/12/2022   Bilateral groin pain 09/17/2022   Community acquired pneumonia 08/20/2022   Right-sided chest pain 08/20/2022   Dysuria 07/25/2022   Abdominal pain 06/19/2022   Occult GI bleeding 03/14/2022   NSTEMI (non-ST elevated myocardial infarction) (HCC) 02/24/2022   Peripheral vascular disease (HCC) 02/24/2022   Acute on chronic blood loss anemia 02/24/2022   Acute blood loss anemia 02/24/2022   Acute otitis media 01/23/2022   Iron deficiency anemia, unspecified 12/25/2021   Need for influenza vaccination 12/14/2021   Unintentional weight loss 12/14/2021   Carotid stenosis, asymptomatic, left 10/12/2021   Asymptomatic carotid artery stenosis without infarction, right 10/08/2019   Abdominal pain, left lower quadrant 06/18/2018   History of colonic polyps 11/05/2017   Diarrhea 08/15/2014   Dizziness 01/04/2014   Transient ischemic attack 01/04/2014   Palpitations 04/22/2013   Esophageal dysphagia 07/02/2012   OA (osteoarthritis) of knee 06/17/2011   Gastroesophageal reflux disease 05/04/2010   DM type 2 causing vascular disease (HCC) 05/04/2010   COPD (chronic obstructive pulmonary disease) (HCC) 05/04/2010   Hemorrhoids 05/04/2010  Mixed hyperlipidemia 04/25/2010   Current smoker 04/25/2010   Essential hypertension, benign 04/25/2010   CORONARY ATHEROSCLEROSIS NATIVE CORONARY ARTERY 04/25/2010   CAROTID ARTERY DISEASE 04/25/2010   PCP:  [name], MD Pharmacy:   [facility] [address], [place] - [address] [place] [address] Phone: [phone] Fax: [phone]     Social Drivers of Health (SDOH) Social History: SDOH  Screenings   Food Insecurity: Patient Unable To Answer (02/11/2023)  Recent Concern: Food Insecurity - Food Insecurity Present (12/15/2022)  Housing: Patient Unable To Answer (02/11/2023)  Transportation Needs: Patient Unable To Answer (02/11/2023)  Utilities: Patient Unable To Answer (02/11/2023)  Alcohol Screen: Low Risk  (06/18/2022)  Depression (PHQ2-9): Low Risk  (12/17/2022)  Financial Resource Strain: Low Risk  (12/15/2022)  Physical Activity: Sufficiently Active (12/15/2022)  Social Connections: Unknown (12/15/2022)  Stress: No Stress Concern Present (12/15/2022)  Tobacco Use: High Risk (02/11/2023)   SDOH Interventions:     Readmission Risk Interventions     No data to display

## 2023-02-13 NOTE — Progress Notes (Addendum)
NAME:  Jimmy Duran, MRN:  [id number], DOB:  26-Dec-1955, LOS: 2 ADMISSION DATE:  02/11/2023, CONSULTATION DATE:  12/11 REFERRING MD:  EDP CHIEF COMPLAINT:  Hypoxia   History of Present Illness:  Jimmy Duran is a 67 y/o gentleman with a PMH significant for HTN, CAD, NSTEMI, PAD, COPD,  TIA/Stroke, Seizures, T2DM, who presented to the hospital with several days of SOB, cough, decreased appetite. He had been around a family member with the flu recently. He was smoking until 4 days ago. Per PTA med list, he was not on bronchodilators as an outpatient. He was hypoxic in the ED and initially started on 2L  but had persistent desaturation and confusion. He was upgraded to BiPAP but over about an hour continued to deteriorate with worse confusion and hypoxia into the 60's, prompting intubation in the ED. VBG initially with 7.08/107/34. Post-intubation he required vasopressors despite 2 liters IVF bolus to tolerate sedation. Viral panel + Influenza A   PCCM consulted for transfer to [facility] for admission Pertinent  Medical History  COPD CAD/NSTEMI DM2 AAA TIA/CVA Seizures PAD GERD HTN Tobacco abuse Significant Hospital Events: Including procedures, antibiotic start and stop dates in addition to other pertinent events   12/10: Presented to [facility] for hypoxia, admitted to ICU 12/11: Extubated Interim History / Subjective:  Overnight required bipap due to respiratory distress, suspect underlying anxiety contributing.   No acute concerns. States doing well. On bipap and when asked why he states he got too excited yesterday that led to him becoming short of breath.    Objective   Blood pressure (!) 146/83, pulse 92, temperature 98.1 F (36.7 C), temperature source Axillary, resp. rate 19, height 5\' 7"  (1.702 m), weight 61.9 kg, SpO2 99%.    Vent Mode: BIPAP;PCV FiO2 (%):  [30 %-40 %] 40 % Set Rate:  [14 bmp-24 bmp] 14 bmp Vt Set:  [530 mL] 530 mL PEEP:  [5 cmH20] 5 cmH20 Pressure Support:  [5  cmH20] 5 cmH20   Intake/Output Summary (Last 24 hours) at 02/13/2023 0739 Last data filed at 02/13/2023 0600 Gross per 24 hour  Intake 590.23 ml  Output 2250 ml  Net -1659.77 ml   Filed Weights   02/11/23 2011 02/12/23 0500 02/13/23 0500  Weight: 61.9 kg 61.9 kg 61.9 kg    Examination: General: laying in bed, NAD HENT: on Bipap Lungs: CTAB, good air movement Cardiovascular: normal rate and rhythm, radial pulse present Abdomen: No TTP, normal bowel sounds Extremities: no asymmetry Neuro: alert and oriented x4  Labs   CBC: Recent Labs  Lab 02/11/23 1518 02/11/23 2117 02/12/23 0058 02/12/23 2042 02/13/23 0200  WBC 7.4  --  10.3  --  8.4  NEUTROABS 6.0  --   --   --  6.6  HGB 13.8 12.9* 13.4 13.6 12.6*  HCT 41.8 38.0* 41.8 40.0 38.6*  MCV 96.1  --  98.8  --  95.1  PLT 203  --  175  --  219   Basic Metabolic Panel: Recent Labs  Lab 02/11/23 1518 02/11/23 2108 02/11/23 2117 02/12/23 0058 02/12/23 2042 02/13/23 0200  NA 135  --  137 137 138 138  K 4.1  --  4.4 4.4 4.6 3.8  CL 98  --   --  105  --  99  CO2 26  --   --  21*  --  29  GLUCOSE 191*  --   --  180*  --  129*  BUN 23  --   --  23  --  22  CREATININE 0.96  --   --  0.96  --  1.02  CALCIUM 9.0  --   --  8.0*  --  8.6*  MG  --  2.5*  --  2.3  --   --   PHOS  --  3.4  --   --   --   --    GFR: Estimated Creatinine Clearance: 63.2 mL/min (by C-G formula based on SCr of 1.02 mg/dL). Recent Labs  Lab 02/11/23 1518 02/11/23 2108 02/12/23 0058 02/13/23 0200  PROCALCITON  --  0.19  --   --   WBC 7.4  --  10.3 8.4  LATICACIDVEN  --  1.7 2.2*  --    Liver Function Tests: Recent Labs  Lab 02/11/23 1518  AST 34  ALT 16  ALKPHOS 37*  BILITOT 0.5  PROT 7.1  ALBUMIN 3.5   No results for input(s): "LIPASE", "AMYLASE" in the last 168 hours. No results for input(s): "AMMONIA" in the last 168 hours. ABG    Component Value Date/Time   PHART 7.319 (L) 02/12/2023 2042   PCO2ART 60.6 (H) 02/12/2023  2042   PO2ART 134 (H) 02/12/2023 2042   HCO3 31.0 (H) 02/12/2023 2042   TCO2 33 (H) 02/12/2023 2042   ACIDBASEDEF 3.0 (H) 02/11/2023 2117   O2SAT 99 02/12/2023 2042    Coagulation Profile: No results for input(s): "INR", "PROTIME" in the last 168 hours. Cardiac Enzymes: No results for input(s): "CKTOTAL", "CKMB", "CKMBINDEX", "TROPONINI" in the last 168 hours. HbA1C: Hemoglobin A1C  Date/Time Value Ref Range Status  12/23/2020 12:00 AM 9.5  Final   Hgb A1c MFr Bld  Date/Time Value Ref Range Status  02/11/2023 09:21 PM 8.0 (H) 4.8 - 5.6 % Final    Comment:    (NOTE) Pre diabetes:          5.7%-6.4%  Diabetes:              >6.4%  Glycemic control for   <7.0% adults with diabetes   10/13/2022 09:18 AM 8.6 (H) 4.8 - 5.6 % Final    Comment:    (NOTE)         Prediabetes: 5.7 - 6.4         Diabetes: >6.4         Glycemic control for adults with diabetes: <7.0    CBG: Recent Labs  Lab 02/12/23 0754 02/12/23 1130 02/12/23 1659 02/12/23 1735 02/12/23 2109  GLUCAP 148* 166* 66* 108* 156*      Consults   Assessment & Plan:  Principal Problem:   Respiratory failure (HCC) Active Problems:   COPD (chronic obstructive pulmonary disease) (HCC)   Seizure, late effect of stroke (HCC)   Acute respiratory failure with hypoxia (HCC)   Influenza A  Acute hypoxic Respiratory Failure 2/2 to Influenza A COPD exacerbation Improving.Pt presented to ED with 4 days of worsening URI symptoms. Resp panel positive for Flu A. Given severe hypoxia, pt intubated in the ED. Pt started on Tamiflu 75 mg for 5 days. Day 3 today. On nebuilizers and steroids Day 2. Suspect he has COPD exacerbation 2/2 to viral infection. Day 2/3 of azithromycin. Will get him mobilized today and ensure his PO intake is improved. He continues to require supplemental oxygen. Will send him to med surg as he has no ICU needs. TOC consulted and working on getting him inhalers that are affordable.   Tobacco use  disorder Smokes 2 ppd, counseled on  smoking cessation. Wants to start NRT so will start that. He states his smoking is to calm his nerves as he has a lot of anxiety. This has been present for >6 months. He meets criteria for GAD, so will start him on Lexapro 10 mg. Avoiding Wellbutrin as it can be activating.   GAD: started on lexapro 10 mg daily.   Seizure Disorder Chronic. On Valproate. Continued here.   HTN Chronic. Holding home BP meds. Restart lisinopril 20 mg BID.    HLD Chronic. On statin, fibrates and aspirin. These medications were restarted.   T2DM Chronic. mSSI here. At home on metformin, ozempic, and insulin 24 units.  If CBG elevated, can start long acting insulin.  GERD Chronic. On PPI  Best Practice (right click and "Reselect all SmartList Selections" daily)  Diet/type: HH DVT prophylaxis: LMWH GI prophylaxis: PPI Lines: N/A Foley:  Yes, and it is still needed Continuous: None Code Status:  full code Last date of multidisciplinary goals of care discussion [12/12]

## 2023-02-14 DIAGNOSIS — J101 Influenza due to other identified influenza virus with other respiratory manifestations: Secondary | ICD-10-CM | POA: Diagnosis not present

## 2023-02-14 DIAGNOSIS — J9601 Acute respiratory failure with hypoxia: Secondary | ICD-10-CM | POA: Diagnosis not present

## 2023-02-14 LAB — CBC WITH DIFFERENTIAL/PLATELET
Abs Immature Granulocytes: 0.02 10*3/uL (ref 0.00–0.07)
Basophils Absolute: 0 10*3/uL (ref 0.0–0.1)
Basophils Relative: 1 %
Eosinophils Absolute: 0 10*3/uL (ref 0.0–0.5)
Eosinophils Relative: 0 %
HCT: 37.3 % — ABNORMAL LOW (ref 39.0–52.0)
Hemoglobin: 12.8 g/dL — ABNORMAL LOW (ref 13.0–17.0)
Immature Granulocytes: 0 %
Lymphocytes Relative: 13 %
Lymphs Abs: 0.7 10*3/uL (ref 0.7–4.0)
MCH: 31.4 pg (ref 26.0–34.0)
MCHC: 34.3 g/dL (ref 30.0–36.0)
MCV: 91.4 fL (ref 80.0–100.0)
Monocytes Absolute: 0.8 10*3/uL (ref 0.1–1.0)
Monocytes Relative: 14 %
Neutro Abs: 4 10*3/uL (ref 1.7–7.7)
Neutrophils Relative %: 72 %
Platelets: 248 10*3/uL (ref 150–400)
RBC: 4.08 MIL/uL — ABNORMAL LOW (ref 4.22–5.81)
RDW: 12.8 % (ref 11.5–15.5)
WBC: 5.5 10*3/uL (ref 4.0–10.5)
nRBC: 0 % (ref 0.0–0.2)

## 2023-02-14 LAB — BASIC METABOLIC PANEL
Anion gap: 13 (ref 5–15)
BUN: 19 mg/dL (ref 8–23)
CO2: 28 mmol/L (ref 22–32)
Calcium: 8.9 mg/dL (ref 8.9–10.3)
Chloride: 95 mmol/L — ABNORMAL LOW (ref 98–111)
Creatinine, Ser: 0.98 mg/dL (ref 0.61–1.24)
GFR, Estimated: >60 mL/min (ref >60)
Glucose, Bld: 149 mg/dL — ABNORMAL HIGH (ref 70–99)
Potassium: 3.3 mmol/L — ABNORMAL LOW (ref 3.5–5.1)
Sodium: 136 mmol/L (ref 135–145)

## 2023-02-14 LAB — GLUCOSE, CAPILLARY
Glucose-Capillary: 180 mg/dL — ABNORMAL HIGH (ref 70–99)
Glucose-Capillary: 203 mg/dL — ABNORMAL HIGH (ref 70–99)
Glucose-Capillary: 208 mg/dL — ABNORMAL HIGH (ref 70–99)
Glucose-Capillary: 169 mg/dL — ABNORMAL HIGH (ref 70–99)

## 2023-02-14 MED ORDER — IPRATROPIUM-ALBUTEROL 0.5-2.5 (3) MG/3ML IN SOLN
3.0000 mL | Freq: Three times a day (TID) | RESPIRATORY_TRACT | Status: DC
Start: 2023-02-15 — End: 2023-02-15
  Administered 2023-02-15: 3 mL via RESPIRATORY_TRACT
  Filled 2023-02-14: qty 3

## 2023-02-14 NOTE — Progress Notes (Signed)
Mobility Specialist Progress Note;   02/14/23 1030  Mobility  Activity Ambulated independently in hallway  Level of Assistance Modified independent, requires aide device or extra time  Assistive Device None  Distance Ambulated (ft) 175 ft  Activity Response Tolerated well  Mobility Referral Yes  Mobility visit 1 Mobility  Mobility Specialist Start Time (ACUTE ONLY) 1030  Mobility Specialist Stop Time (ACUTE ONLY) 1037  Mobility Specialist Time Calculation (min) (ACUTE ONLY) 7 min   Pt agreeable to mobility. Required no physical assistance during ambulation, ModI. Ambulated on RA, VSS throughout. Displayed cough at EOS, otherwise asx. Pt returned back to bed with all needs met. RN notified.   Caesar Bookman Mobility Specialist Please contact via SecureChat or [employer] [phone]

## 2023-02-14 NOTE — Progress Notes (Signed)
Nurse requested Mobility Specialist to perform oxygen saturation test with pt which includes removing pt from oxygen both at rest and while ambulating.  Below are the results from that testing.     Patient Saturations on Room Air at Rest = spO2 92%  Patient Saturations on Room Air while Ambulating = sp02 90% .   Patient Saturations on 0 Liters of oxygen while Ambulating = sp02 90-93%  At end of testing pt left in room on Room Air.  Reported results to nurse.

## 2023-02-14 NOTE — Evaluation (Signed)
Clinical/Bedside Swallow Evaluation Patient Details  Name: Jimmy Duran MRN: [id number] Date of Birth: 07/25/1955  Today's Date: 02/14/2023 Time: SLP Start Time (ACUTE ONLY): 1628 SLP Stop Time (ACUTE ONLY): 1640 SLP Time Calculation (min) (ACUTE ONLY): 12 min  Past Medical History:  Past Medical History:  Diagnosis Date   AAA (abdominal aortic aneurysm) (HCC)    Arthritis    Carotid artery disease (HCC)    Cataract    Colon polyp    COPD (chronic obstructive pulmonary disease) (HCC)    Coronary artery disease    Nonobstructive   Essential hypertension    Focal seizure (HCC) 10/15/2022   GERD (gastroesophageal reflux disease)    Hemorrhoids    History of TIA (transient ischemic attack)    Low back pain    Lumbar radiculopathy    Mixed hyperlipidemia    Palpitations    Peripheral vascular disease (HCC)    Shingles 09/23/2019   Stroke (HCC)    Tobacco dependence    Type 2 diabetes mellitus (HCC)    Past Surgical History:  Past Surgical History:  Procedure Laterality Date   APPENDECTOMY  1970   BIOPSY  12/08/2017   Procedure: BIOPSY;  Surgeon: Jimmy Ade, MD;  [facility]: [facility];  Service: Endoscopy;;  esophagus    BIOPSY  12/28/2020   Procedure: BIOPSY;  Surgeon: Jimmy Ade, MD;  [facility]: [facility];  Service: Endoscopy;;   BIOPSY  07/12/2021   Procedure: BIOPSY;  Surgeon: Jimmy Ade, MD;  [facility]: [facility];  Service: Endoscopy;;   BREAST SURGERY Right    Cyst resection on the right   CARDIAC CATHETERIZATION  1990's   in [place]. no stent placement   CATARACT EXTRACTION W/PHACO Right 06/18/2016   Procedure: CATARACT EXTRACTION PHACO AND INTRAOCULAR LENS PLACEMENT (IOC);  Surgeon: Jimmy Manila, MD;  Location: [facility];  Service: Ophthalmology;  Laterality: Right;  [name] 35.7 AP% 16.6 CDE 5.94 Fluid pack lot # [id number]   CATARACT EXTRACTION W/PHACO Left 07/16/2016   Procedure: CATARACT EXTRACTION PHACO AND INTRAOCULAR LENS  PLACEMENT (IOC);  Surgeon: Jimmy Manila, MD;  Location: [facility];  Service: Ophthalmology;  Laterality: Left;  [name] 51.8 AP% 12.7 CDE 6.58 Fluid Pack Lot # [id number]   CHOLECYSTECTOMY  1993   COLONOSCOPY  02/12/2007   HQI:[id number] friable anal canal hemorrhoids, otherwise normal rectum and colon   COLONOSCOPY N/A 07/16/2012   RMR: Colonic diverticulosis. 4 mm tubular adenoma   COLONOSCOPY WITH PROPOFOL N/A 12/08/2017   Dr. Jena Duran: sigmoid and descending colon diverticulosis, transverse colon polyp (TUBULAR ADENOMA)   COLONOSCOPY WITH PROPOFOL N/A 03/06/2022   Procedure: COLONOSCOPY WITH PROPOFOL;  Surgeon: Jimmy Ade, MD;  [facility];  Service: Endoscopy;  Laterality: N/A;  12:30 PM   ENDARTERECTOMY Right 10/08/2019   Procedure: RIGHT CAROTID ENDARTERECTOMY;  Surgeon: Jimmy Earthly, MD;  Location: [facility];  Service: Vascular;  Laterality: Right;   ESOPHAGOGASTRODUODENOSCOPY (EGD) WITH ESOPHAGEAL DILATION N/A 07/16/2012   RMR: +Candida esophagitis, Erosive reflux esophagiits. Schatizi's ring status post dilation. Hiatal hernia. Antral erosions status post biopsy.    ESOPHAGOGASTRODUODENOSCOPY (EGD) WITH PROPOFOL N/A 12/08/2017   Dr. Jena Duran: esophagitis, query EOE but negative for increased eosinophils on path, small hiatal hernia, normal stomach, normal duodenum, PAS stain with rare yeast forms on stain. Empiric dilation   ESOPHAGOGASTRODUODENOSCOPY (EGD) WITH PROPOFOL N/A 12/28/2020   esophagitis without bleeding, s/p biopsy and dilation. Antral erosions s/p biopsy.   ESOPHAGOGASTRODUODENOSCOPY (EGD) WITH PROPOFOL N/A  07/12/2021   Surgeon: Jimmy Ade, MD;  Cobblestoning appearance of distal esophagus, slight narrowing of distal esophagus s/p dilation with 56 Fr and biopsy, gastric erosions biopsied, normal examined duodenum.  Esophageal biopsy with reactive squamous mucosa, gastric biopsy with reactive gastropathy, negative for H. pylori.   ESOPHAGOGASTRODUODENOSCOPY  (EGD) WITH PROPOFOL N/A 03/06/2022   Procedure: ESOPHAGOGASTRODUODENOSCOPY (EGD) WITH PROPOFOL;  Surgeon: Jimmy Ade, MD;  [facility];  Service: Endoscopy;  Laterality: N/A;   EYE SURGERY Bilateral 2018   cataract   GIVENS CAPSULE STUDY N/A 03/13/2022   Procedure: GIVENS CAPSULE STUDY;  Surgeon: Jimmy Ade, MD;  [facility];  Service: Endoscopy;  Laterality: N/A;  7:30am   HERNIA REPAIR     JOINT REPLACEMENT     KNEE SURGERY     Left knee arthroscopy torn medial meniscus grade 4 chondral changes medial femoral condyle tibial plateau   MALONEY DILATION N/A 12/08/2017   Procedure: MALONEY DILATION;  Surgeon: Jimmy Ade, MD;  [facility];  Service: Endoscopy;  Laterality: N/A;   MALONEY DILATION N/A 12/28/2020   Procedure: Jimmy Duran DILATION;  Surgeon: Jimmy Ade, MD;  [facility];  Service: Endoscopy;  Laterality: N/A;   MALONEY DILATION N/A 07/12/2021   Procedure: Jimmy Duran DILATION;  Surgeon: Jimmy Ade, MD;  [facility];  Service: Endoscopy;  Laterality: N/A;   PATCH ANGIOPLASTY Right 10/08/2019   Procedure: PATCH ANGIOPLASTY of the right common carotid artery using hemashield plaltinum finesse patch;  Surgeon: Jimmy Earthly, MD;  Location: [facility];  Service: Vascular;  Laterality: Right;   PERIPHERAL VASCULAR INTERVENTION  07/17/2020   Procedure: PERIPHERAL VASCULAR INTERVENTION;  Surgeon: Jimmy Harman, MD;  [facility]: [facility] INVASIVE CV LAB;  Service: Cardiovascular;;   POLYPECTOMY  12/08/2017   Procedure: POLYPECTOMY;  Surgeon: Jimmy Ade, MD;  [facility];  Service: Endoscopy;;  colon   POLYPECTOMY  03/06/2022   Procedure: POLYPECTOMY;  Surgeon: Jimmy Ade, MD;  [facility];  Service: Endoscopy;;   TOTAL KNEE ARTHROPLASTY Left 04/27/2012   Procedure: TOTAL KNEE ARTHROPLASTY;  Surgeon: Jimmy Hearing, MD;  Location: [facility];  Service: Orthopedics;  Laterality:  Left;   TRANSCAROTID ARTERY REVASCULARIZATION  Left 10/12/2021   Procedure: Left Transcarotid Artery Revascularization;  Surgeon: Jimmy Harman, MD;  Location: [facility];  Service: Vascular;  Laterality: Left;   UMBILICAL HERNIA REPAIR  1993   VISCERAL ANGIOGRAPHY N/A 07/17/2020   Procedure: mesenteric ANGIOGRAPHY;  Surgeon: Jimmy Harman, MD;  Location: [facility] INVASIVE CV LAB;  Service: Cardiovascular;  Laterality: N/A;   HPI:  Jimmy Duran is a 67 yo male presenting to ED 12/10 with progressive SOB and non-productive cough. Workup revealed pt positive for influenza A. Pt hypoxic in the ED with persistent desaturation and confusion, requiring ETT 12/10-12/11. SLP consulted due to reports of pills getting stuck per MD, although pt unable to participate in esophogram until 12/16. PMH includes HTN, CAD, NSTEMI, PAD, COPD, TIA/CVA, seizures, T2DM, tobacco abuse, GERD    Assessment / Plan / Recommendation  Clinical Impression  Pt reports experiencing a globus sensation with pills and meat frequently. He states he has had multiple esophageal dilations and per chart, most recently May 2023. Oral motor exam WFL. All trials of thin liquids, purees, and solids observed with no s/s of oropharyngeal dysphagia or aspiration. He had delayed throat clearing at times and reported no resolution of globus sensation  after taking pills prior to session. Suspect pt's presentation may be indicative of an esophageal component for which he may benefit from ongoing assessment per MD discretion. Provided education regarding esophageal precautions and taking pills with purees with pt verbalizing understanding. Recommend continuing current diet without further SLP f/u warranted at this time. SLP Visit Diagnosis: Dysphagia, unspecified (R13.10)    Aspiration Risk  Mild aspiration risk    Diet Recommendation Regular;Thin liquid    Liquid Administration via: Cup;Straw Medication Administration: Crushed  with puree Supervision: Patient able to self feed Compensations: Slow rate;Small sips/bites Postural Changes: Seated upright at 90 degrees;Remain upright for at least 30 minutes after po intake    Other  Recommendations Recommended Consults: Consider GI evaluation;Consider esophageal assessment Oral Care Recommendations: Oral care BID    Recommendations for follow up therapy are one component of a multi-disciplinary discharge planning process, led by the attending physician.  Recommendations may be updated based on patient status, additional functional criteria and insurance authorization.  Follow up Recommendations No SLP follow up      Assistance Recommended at Discharge    Functional Status Assessment Patient has not had a recent decline in their functional status  Frequency and Duration            Prognosis Prognosis for improved oropharyngeal function: Good      Swallow Study   General HPI: Jimmy Duran is a 67 yo male presenting to ED 12/10 with progressive SOB and non-productive cough. Workup revealed pt positive for influenza A. Pt hypoxic in the ED with persistent desaturation and confusion, requiring ETT 12/10-12/11. SLP consulted due to reports of pills getting stuck per MD, although pt unable to participate in esophogram until 12/16. PMH includes HTN, CAD, NSTEMI, PAD, COPD, TIA/CVA, seizures, T2DM, tobacco abuse, GERD Type of Study: Bedside Swallow Evaluation Previous Swallow Assessment: none in chart Diet Prior to this Study: Regular;Thin liquids (Level 0) Temperature Spikes Noted: No Respiratory Status: Room air History of Recent Intubation: Yes Total duration of intubation (days): 1 days Date extubated: 02/12/23 Behavior/Cognition: Alert;Cooperative;Pleasant mood Oral Cavity Assessment: Within Functional Limits Oral Care Completed by SLP: No Oral Cavity - Dentition: Edentulous Vision: Functional for self-feeding Self-Feeding Abilities: Able to feed  self Patient Positioning: Upright in bed Baseline Vocal Quality: Normal Volitional Cough: Strong Volitional Swallow: Able to elicit    Oral/Motor/Sensory Function Overall Oral Motor/Sensory Function: Within functional limits   Ice Chips Ice chips: Not tested   Thin Liquid Thin Liquid: Within functional limits Presentation: Straw;Self Fed    Nectar Thick Nectar Thick Liquid: Not tested   Honey Thick Honey Thick Liquid: Not tested   Puree Puree: Within functional limits Presentation: Spoon;Self Fed   Solid     Solid: Within functional limits Presentation: Self Fed      Gwynneth Aliment, M.A., CF-SLP Speech Language Pathology, Acute Rehabilitation Services  Secure Chat preferred [phone]  02/14/2023,5:08 PM

## 2023-02-14 NOTE — Progress Notes (Addendum)
PROGRESS NOTE    Jimmy Duran [facility]  WUJ:[id number] DOB: Jul 09, 1955 DOA: 02/11/2023 PCP: [name], MD    Brief Narrative:  Jimmy Duran is a 67 y/o gentleman with a PMH significant for HTN, CAD, NSTEMI, PAD, COPD,  TIA/Stroke, Seizures, Diabetes, who presented to the hospital with several days of SOB, cough, decreased appetite. He had been around a family member with the flu recently. He was smoking until 4 days ago. Per PTA med list, he was not on bronchodilators as an outpatient. He was hypoxic in the ED and initially started on 2L [place] but had persistent desaturation and confusion. He was upgraded to BiPAP but over about an hour continued to deteriorate with worse confusion and hypoxia into the 60's, prompting intubation in the ED. Post-intubation he required vasopressors despite 2 liters IVF bolus to tolerate sedation. Viral panel + Influenza A    Assessment and Plan: Acute hypoxic Respiratory Failure 2/2 to Influenza A COPD exacerbation Improving.Pt presented to ED with 4 days of worsening URI symptoms. Resp panel positive for Flu A. Given severe hypoxia, pt intubated in the ED. Pt started on Tamiflu 75 mg for 5 days. Day 3 today. On nebuilizers and steroids Day 2. Suspect he has COPD exacerbation 2/2 to viral infection. Day 2/3 of azithromycin. Will get him mobilized today and ensure his PO intake is improved.  -dg esophagus as decribing pills getting stuck -home O2 study-- no need for O2 Home in AM?   Tobacco use disorder Smokes 2 ppd, counseled on smoking cessation. Wants to start NRT so will start that. He states his smoking is to calm his nerves as he has a lot of anxiety. This has been present for >6 months. He meets criteria for GAD, so will start him on Lexapro 10 mg  GAD: started on lexapro 10 mg daily.    Seizure Disorder Chronic. On Valproate. Continued here.    HTN Chronic. -resume home meds   HLD Chronic. On statin, fibrates and aspirin.   T2DM Chronic. -SSI    GERD-- c/o feeling like pill is stuck -- check dg esophagus (unfortunately not able to be done in the hospital until [facility] per radiology) Chronic. On PPI  Hypokalemia -replete  DVT prophylaxis: enoxaparin (LOVENOX) injection 40 mg Start: 02/11/23 2200    Code Status: Full Code Family Communication:   Disposition Plan:  Level of care: Med-Surg Status is: Inpatient     Consultants:  PCCM   Subjective: Feeling better except for pills getting stuck in his throat  Objective: Vitals:   02/13/23 2201 02/14/23 0542 02/14/23 0731 02/14/23 0834  BP: 138/82 (!) 144/87  125/77  Pulse: 99 78  94  Resp: 16 17  18   Temp: 99.2 F (37.3 C) 98.5 F (36.9 C)  98.8 F (37.1 C)  TempSrc: Oral Oral  Oral  SpO2: 94% 98% 98% 98%  Weight:  57.4 kg    Height:       No intake or output data in the 24 hours ending 02/14/23 1259 Filed Weights   02/12/23 0500 02/13/23 0500 02/14/23 0542  Weight: 61.9 kg 61.9 kg 57.4 kg    Examination:   General: Appearance:    Thin male in no acute distress   No thrush  Lungs:      respirations unlabored  Heart:    Normal heart rate..    MS:   All extremities are intact.    Neurologic:   Awake, alert, oriented x 3. No apparent focal neurological  defect.        Data Reviewed: I have personally reviewed following labs and imaging studies  CBC: Recent Labs  Lab 02/11/23 1518 02/11/23 2117 02/12/23 0058 02/12/23 2042 02/13/23 0200 02/14/23 0725  WBC 7.4  --  10.3  --  8.4 5.5  NEUTROABS 6.0  --   --   --  6.6 4.0  HGB 13.8 12.9* 13.4 13.6 12.6* 12.8*  HCT 41.8 38.0* 41.8 40.0 38.6* 37.3*  MCV 96.1  --  98.8  --  95.1 91.4  PLT 203  --  175  --  219 248   Basic Metabolic Panel: Recent Labs  Lab 02/11/23 1518 02/11/23 2108 02/11/23 2117 02/12/23 0058 02/12/23 2042 02/13/23 0200 02/14/23 0725  NA 135  --  137 137 138 138 136  K 4.1  --  4.4 4.4 4.6 3.8 3.3*  CL 98  --   --  105  --  99 95*  CO2 26  --   --  21*  --   29 28  GLUCOSE 191*  --   --  180*  --  129* 149*  BUN 23  --   --  23  --  22 19  CREATININE 0.96  --   --  0.96  --  1.02 0.98  CALCIUM 9.0  --   --  8.0*  --  8.6* 8.9  MG  --  2.5*  --  2.3  --   --   --   PHOS  --  3.4  --   --   --   --   --    GFR: Estimated Creatinine Clearance: 61 mL/min (by C-G formula based on SCr of 0.98 mg/dL). Liver Function Tests: Recent Labs  Lab 02/11/23 1518  AST 34  ALT 16  ALKPHOS 37*  BILITOT 0.5  PROT 7.1  ALBUMIN 3.5   No results for input(s): "LIPASE", "AMYLASE" in the last 168 hours. No results for input(s): "AMMONIA" in the last 168 hours. Coagulation Profile: No results for input(s): "INR", "PROTIME" in the last 168 hours. Cardiac Enzymes: No results for input(s): "CKTOTAL", "CKMB", "CKMBINDEX", "TROPONINI" in the last 168 hours. BNP (last 3 results) No results for input(s): "PROBNP" in the last 8760 hours. HbA1C: Recent Labs    02/11/23 2121  HGBA1C 8.0*   CBG: Recent Labs  Lab 02/13/23 1121 02/13/23 1539 02/13/23 2206 02/14/23 0832 02/14/23 1227  GLUCAP 132* 194* 195* 180* 203*   Lipid Profile: No results for input(s): "CHOL", "HDL", "LDLCALC", "TRIG", "CHOLHDL", "LDLDIRECT" in the last 72 hours. Thyroid Function Tests: No results for input(s): "TSH", "T4TOTAL", "FREET4", "T3FREE", "THYROIDAB" in the last 72 hours. Anemia Panel: No results for input(s): "VITAMINB12", "FOLATE", "FERRITIN", "TIBC", "IRON", "RETICCTPCT" in the last 72 hours. Sepsis Labs: Recent Labs  Lab 02/11/23 2108 02/12/23 0058  PROCALCITON 0.19  --   LATICACIDVEN 1.7 2.2*    Recent Results (from the past 240 hours)  Resp panel by RT-PCR (RSV, Flu A&B, Covid) Anterior Nasal Swab     Status: Abnormal   Collection Time: 02/11/23  2:53 PM   Specimen: Anterior Nasal Swab  Result Value Ref Range Status   SARS Coronavirus 2 by RT PCR NEGATIVE NEGATIVE Final    Comment: (NOTE) SARS-CoV-2 target nucleic acids are NOT DETECTED.  The SARS-CoV-2  RNA is generally detectable in upper respiratory specimens during the acute phase of infection. The lowest concentration of SARS-CoV-2 viral copies this assay can detect is 138 copies/mL. A  negative result does not preclude SARS-Cov-2 infection and should not be used as the sole basis for treatment or other patient management decisions. A negative result may occur with  improper specimen collection/handling, submission of specimen other than nasopharyngeal swab, presence of viral mutation(s) within the areas targeted by this assay, and inadequate number of viral copies(<138 copies/mL). A negative result must be combined with clinical observations, patient history, and epidemiological information. The expected result is Negative.  Fact Sheet for Patients:  BloggerCourse.com  Fact Sheet for Healthcare Providers:  SeriousBroker.it  This test is no t yet approved or cleared by the [place] FDA and  has been authorized for detection and/or diagnosis of SARS-CoV-2 by FDA under an Emergency Use Authorization (EUA). This EUA will remain  in effect (meaning this test can be used) for the duration of the COVID-19 declaration under Section 564(b)(1) of the Act, 21 U.S.C.section 360bbb-3(b)(1), unless the authorization is terminated  or revoked sooner.       Influenza A by PCR POSITIVE (A) NEGATIVE Final   Influenza B by PCR NEGATIVE NEGATIVE Final    Comment: (NOTE) The Xpert Xpress SARS-CoV-2/FLU/RSV plus assay is intended as an aid in the diagnosis of influenza from Nasopharyngeal swab specimens and should not be used as a sole basis for treatment. Nasal washings and aspirates are unacceptable for Xpert Xpress SARS-CoV-2/FLU/RSV testing.  Fact Sheet for Patients: BloggerCourse.com  Fact Sheet for Healthcare Providers: SeriousBroker.it  This test is not yet approved or cleared by  the [place] FDA and has been authorized for detection and/or diagnosis of SARS-CoV-2 by FDA under an Emergency Use Authorization (EUA). This EUA will remain in effect (meaning this test can be used) for the duration of the COVID-19 declaration under Section 564(b)(1) of the Act, 21 U.S.C. section 360bbb-3(b)(1), unless the authorization is terminated or revoked.     Resp Syncytial Virus by PCR NEGATIVE NEGATIVE Final    Comment: (NOTE) Fact Sheet for Patients: BloggerCourse.com  Fact Sheet for Healthcare Providers: SeriousBroker.it  This test is not yet approved or cleared by the [place] FDA and has been authorized for detection and/or diagnosis of SARS-CoV-2 by FDA under an Emergency Use Authorization (EUA). This EUA will remain in effect (meaning this test can be used) for the duration of the COVID-19 declaration under Section 564(b)(1) of the Act, 21 U.S.C. section 360bbb-3(b)(1), unless the authorization is terminated or revoked.  Performed at [facility], [address], [place], [place] [address]   Culture, Respiratory w Gram Stain (tracheal aspirate)     Status: None   Collection Time: 02/11/23  8:39 PM   Specimen: Tracheal Aspirate; Respiratory  Result Value Ref Range Status   Specimen Description TRACHEAL ASPIRATE  Final   Special Requests NONE  Final   Gram Stain   Final    FEW WBC PRESENT,BOTH PMN AND MONONUCLEAR FEW GRAM POSITIVE COCCI IN PAIRS AND CHAINS    Culture   Final    FEW HAEMOPHILUS INFLUENZAE BETA LACTAMASE NEGATIVE Performed at [facility], [address], [place], [place] [address]    Report Status 02/13/2023 FINAL  Final  Culture, blood (Routine X 2) w Reflex to ID Panel     Status: None (Preliminary result)   Collection Time: 02/11/23  9:08 PM   Specimen: BLOOD RIGHT ARM  Result Value Ref Range Status   Specimen Description BLOOD RIGHT ARM  Final   Special Requests   Final     BOTTLES DRAWN AEROBIC AND  ANAEROBIC Blood Culture results may not be optimal due to an inadequate volume of blood received in culture bottles   Culture   Final    NO GROWTH 3 DAYS Performed at [facility], [address], [place], [place] [address]    Report Status PENDING  Incomplete  Culture, blood (Routine X 2) w Reflex to ID Panel     Status: Abnormal   Collection Time: 02/11/23  9:21 PM   Specimen: BLOOD LEFT ARM  Result Value Ref Range Status   Specimen Description BLOOD LEFT ARM  Final   Special Requests   Final    BOTTLES DRAWN AEROBIC ONLY Blood Culture results may not be optimal due to an inadequate volume of blood received in culture bottles   Culture  Setup Time   Final    GRAM POSITIVE COCCI IN CLUSTERS BOTTLES DRAWN AEROBIC ONLY CRITICAL RESULT CALLED TO, READ BACK BY AND VERIFIED WITH: PHARMD [id number] AT 2020 CAREN A. , ADC    Culture (A)  Final    STAPHYLOCOCCUS EPIDERMIDIS THE SIGNIFICANCE OF ISOLATING THIS ORGANISM FROM A SINGLE SET OF BLOOD CULTURES WHEN MULTIPLE SETS ARE DRAWN IS UNCERTAIN. PLEASE NOTIFY THE MICROBIOLOGY DEPARTMENT WITHIN ONE WEEK IF SPECIATION AND SENSITIVITIES ARE REQUIRED. Performed at [facility], [address], [place], [place] [address]    Report Status 02/13/2023 FINAL  Final  Blood Culture ID Panel (Reflexed)     Status: Abnormal   Collection Time: 02/11/23  9:21 PM  Result Value Ref Range Status   Enterococcus faecalis NOT DETECTED NOT DETECTED Final   Enterococcus Faecium NOT DETECTED NOT DETECTED Final   Listeria monocytogenes NOT DETECTED NOT DETECTED Final   Staphylococcus species DETECTED (A) NOT DETECTED Final    Comment: CRITICAL RESULT CALLED TO, READ BACK BY AND VERIFIED WITH: PHARMD [id number] AT 2020 CAREN A. , ADC    Staphylococcus aureus (BCID) NOT DETECTED NOT DETECTED Final   Staphylococcus epidermidis DETECTED (A) NOT DETECTED Final    Comment: CRITICAL RESULT CALLED TO, READ BACK BY AND VERIFIED WITH: PHARMD  [id number] AT 2020 CAREN A. , ADC    Staphylococcus lugdunensis NOT DETECTED NOT DETECTED Final   Streptococcus species NOT DETECTED NOT DETECTED Final   Streptococcus agalactiae NOT DETECTED NOT DETECTED Final   Streptococcus pneumoniae NOT DETECTED NOT DETECTED Final   Streptococcus pyogenes NOT DETECTED NOT DETECTED Final   A.calcoaceticus-baumannii NOT DETECTED NOT DETECTED Final   Bacteroides fragilis NOT DETECTED NOT DETECTED Final   Enterobacterales NOT DETECTED NOT DETECTED Final   Enterobacter cloacae complex NOT DETECTED NOT DETECTED Final   Escherichia coli NOT DETECTED NOT DETECTED Final   Klebsiella aerogenes NOT DETECTED NOT DETECTED Final   Klebsiella oxytoca NOT DETECTED NOT DETECTED Final   Klebsiella pneumoniae NOT DETECTED NOT DETECTED Final   Proteus species NOT DETECTED NOT DETECTED Final   Salmonella species NOT DETECTED NOT DETECTED Final   Serratia marcescens NOT DETECTED NOT DETECTED Final   Haemophilus influenzae NOT DETECTED NOT DETECTED Final   Neisseria meningitidis NOT DETECTED NOT DETECTED Final   Pseudomonas aeruginosa NOT DETECTED NOT DETECTED Final   Stenotrophomonas maltophilia NOT DETECTED NOT DETECTED Final   Candida albicans NOT DETECTED NOT DETECTED Final   Candida auris NOT DETECTED NOT DETECTED Final   Candida glabrata NOT DETECTED NOT DETECTED Final   Candida krusei NOT DETECTED NOT DETECTED Final   Candida parapsilosis NOT DETECTED NOT DETECTED Final   Candida tropicalis NOT DETECTED NOT DETECTED Final  Cryptococcus neoformans/gattii NOT DETECTED NOT DETECTED Final   Methicillin resistance mecA/C NOT DETECTED NOT DETECTED Final    Comment: Performed at [facility], [address], [place], [place] [address]         Radiology Studies: DG Chest Port 1 View Result Date: 02/12/2023 CLINICAL DATA:  Acute respiratory failure. EXAM: PORTABLE CHEST 1 VIEW COMPARISON:  Chest radiograph dated 02/11/2023. FINDINGS: No focal consolidation,  pleural effusion, or pneumothorax. The cardiac silhouette is within normal limits. Atherosclerotic calcification of the aortic arch. No acute osseous pathology. IMPRESSION: No active disease. Electronically Signed   By: Elgie Collard M.D.   On: 02/12/2023 20:57        Scheduled Meds:  amoxicillin  1,000 mg Oral Q8H   aspirin  81 mg Oral Daily   budesonide (PULMICORT) nebulizer solution  0.5 mg Nebulization BID   Chlorhexidine Gluconate Cloth  6 each Topical Daily   divalproex  500 mg Oral Daily   enoxaparin (LOVENOX) injection  40 mg Subcutaneous Q24H   escitalopram  10 mg Oral Daily   feeding supplement (GLUCERNA SHAKE)  237 mL Oral TID BM   fenofibrate  160 mg Oral Daily   insulin aspart  0-15 Units Subcutaneous TID WC   insulin aspart  0-5 Units Subcutaneous QHS   ipratropium-albuterol  3 mL Nebulization Q6H   lisinopril  20 mg Oral BID   nicotine  21 mg Transdermal Q0600   mouth rinse  15 mL Mouth Rinse 4 times per day   oseltamivir  75 mg Oral BID   pantoprazole  20 mg Oral Daily   predniSONE  40 mg Oral Q breakfast   rosuvastatin  40 mg Oral Daily   Continuous Infusions:   LOS: 3 days    Time spent: 45 minutes spent on chart review, discussion with nursing staff, consultants, updating family and interview/physical exam; more than 50% of that time was spent in counseling and/or coordination of care.    Joseph Art, DO [facility] Available via Epic secure chat 7am-7pm After these hours, please refer to coverage provider listed on [email or web address] 02/14/2023, 12:59 PM

## 2023-02-15 ENCOUNTER — Other Ambulatory Visit (HOSPITAL_COMMUNITY): Payer: Self-pay

## 2023-02-15 DIAGNOSIS — J9601 Acute respiratory failure with hypoxia: Secondary | ICD-10-CM | POA: Diagnosis not present

## 2023-02-15 DIAGNOSIS — J101 Influenza due to other identified influenza virus with other respiratory manifestations: Secondary | ICD-10-CM | POA: Diagnosis not present

## 2023-02-15 LAB — CBC WITH DIFFERENTIAL/PLATELET
Abs Immature Granulocytes: 0.02 10*3/uL (ref 0.00–0.07)
Basophils Absolute: 0 10*3/uL (ref 0.0–0.1)
Basophils Relative: 0 %
Eosinophils Absolute: 0 10*3/uL (ref 0.0–0.5)
Eosinophils Relative: 1 %
HCT: 38.2 % — ABNORMAL LOW (ref 39.0–52.0)
Hemoglobin: 13.4 g/dL (ref 13.0–17.0)
Immature Granulocytes: 0 %
Lymphocytes Relative: 24 %
Lymphs Abs: 1.1 10*3/uL (ref 0.7–4.0)
MCH: 31.3 pg (ref 26.0–34.0)
MCHC: 35.1 g/dL (ref 30.0–36.0)
MCV: 89.3 fL (ref 80.0–100.0)
Monocytes Absolute: 0.7 10*3/uL (ref 0.1–1.0)
Monocytes Relative: 14 %
Neutro Abs: 2.9 10*3/uL (ref 1.7–7.7)
Neutrophils Relative %: 61 %
Platelets: 253 10*3/uL (ref 150–400)
RBC: 4.28 MIL/uL (ref 4.22–5.81)
RDW: 12.8 % (ref 11.5–15.5)
WBC: 4.8 10*3/uL (ref 4.0–10.5)
nRBC: 0 % (ref 0.0–0.2)

## 2023-02-15 LAB — GLUCOSE, CAPILLARY
Glucose-Capillary: 172 mg/dL — ABNORMAL HIGH (ref 70–99)
Glucose-Capillary: 259 mg/dL — ABNORMAL HIGH (ref 70–99)

## 2023-02-15 LAB — BASIC METABOLIC PANEL
Anion gap: 9 (ref 5–15)
BUN: 17 mg/dL (ref 8–23)
CO2: 30 mmol/L (ref 22–32)
Calcium: 8.9 mg/dL (ref 8.9–10.3)
Chloride: 98 mmol/L (ref 98–111)
Creatinine, Ser: 1.06 mg/dL (ref 0.61–1.24)
GFR, Estimated: >60 mL/min (ref >60)
Glucose, Bld: 156 mg/dL — ABNORMAL HIGH (ref 70–99)
Potassium: 3.3 mmol/L — ABNORMAL LOW (ref 3.5–5.1)
Sodium: 137 mmol/L (ref 135–145)

## 2023-02-15 MED ORDER — POTASSIUM CHLORIDE CRYS ER 20 MEQ PO TBCR
40.0000 meq | EXTENDED_RELEASE_TABLET | Freq: Once | ORAL | Status: AC
  Administered 2023-02-15: 40 meq via ORAL
  Filled 2023-02-15: qty 2

## 2023-02-15 MED ORDER — AMOXICILLIN 500 MG PO CAPS
1000.0000 mg | ORAL_CAPSULE | Freq: Three times a day (TID) | ORAL | 0 refills | Status: DC
  Filled 2023-02-15: qty 9, 2d supply, fill #0

## 2023-02-15 MED ORDER — NICOTINE 21 MG/24HR TD PT24: 21.0000 mg | MEDICATED_PATCH | Freq: Every day | TRANSDERMAL | Status: DC

## 2023-02-15 MED ORDER — PREDNISONE 20 MG PO TABS
40.0000 mg | ORAL_TABLET | Freq: Every day | ORAL | 0 refills | Status: DC
  Filled 2023-02-15: qty 4, 2d supply, fill #0

## 2023-02-15 MED ORDER — ESCITALOPRAM OXALATE 10 MG PO TABS
10.0000 mg | ORAL_TABLET | Freq: Every day | ORAL | 1 refills | Status: DC
  Filled 2023-02-15: qty 30, 30d supply, fill #0

## 2023-02-15 MED ORDER — OSELTAMIVIR PHOSPHATE 75 MG PO CAPS
75.0000 mg | ORAL_CAPSULE | Freq: Two times a day (BID) | ORAL | 0 refills | Status: DC
  Filled 2023-02-15: qty 4, 2d supply, fill #0

## 2023-02-15 MED ORDER — IPRATROPIUM-ALBUTEROL 0.5-2.5 (3) MG/3ML IN SOLN: 3.0000 mL | Freq: Two times a day (BID) | RESPIRATORY_TRACT | Status: DC

## 2023-02-15 NOTE — H&P (Deleted)
Physician Discharge Summary  Jimmy Duran GNF:[id number] DOB: 1956/01/19 DOA: 02/11/2023  PCP: [name], MD  Admit date: 02/11/2023 Discharge date: 02/15/2023  Admitted From: home Discharge disposition: home   Recommendations for Outpatient Follow-Up:   Follow up with pulmonary-- please refer Patient to fill out forms for help with inhalers Stop smoking  Outpatient follow up with GI   Discharge Diagnosis:   Principal Problem:   Respiratory failure (HCC) Active Problems:   COPD (chronic obstructive pulmonary disease) (HCC)   Seizure, late effect of stroke (HCC)   Acute respiratory failure with hypoxia (HCC)   Influenza A    Discharge Condition: Improved.  Diet recommendation: Low sodium, heart healthy.  Wound care: None.  Code status: Full.   History of Present Illness:   Jimmy Duran is a 67 y/o gentleman with a PMH significant for HTN, CAD, NSTEMI, PAD, COPD,  TIA/Stroke, Seizures, Diabetes, who presented to the hospital with several days of SOB, cough, decreased appetite. He had been around a family member with the flu recently. He was smoking until 4 days ago. Per PTA med list, he was not on bronchodilators as an outpatient. He was hypoxic in the ED and initially started on 2L  but had persistent desaturation and confusion. He was upgraded to BiPAP but over about an hour continued to deteriorate with worse confusion and hypoxia into the 60's, prompting intubation in the ED. Post-intubation he required vasopressors despite 2 liters IVF bolus to tolerate sedation. Viral panel + Influenza A   PCCM consulted for transfer to [facility] for admission   Hospital Course by Problem:   Acute hypoxic Respiratory Failure 2/2 to Influenza A COPD exacerbation Improving.Pt presented to ED with 4 days of worsening URI symptoms. Resp panel positive for Flu A. Given severe hypoxia, pt intubated in the ED. -extubated and tx to floor -home O2 study-- no need for  O2    Tobacco use disorder Smokes 2 ppd, counseled on smoking cessation. Wants to start NRT so will start that. He states his smoking is to calm his nerves as he has a lot of anxiety. This has been present for >6 months. He meets criteria for GAD, so will start him on Lexapro 10 mg  GAD: started on lexapro 10 mg daily-- titrate outpatient   Seizure Disorder Chronic. On Valproate. Continued here.    HTN Chronic. -resume home meds   HLD Chronic. On statin, fibrates and aspirin.   T2DM Chronic. -SSI   GERD-- c/o feeling like pill is stuck -- check dg esophagus (unfortunately not able to be done in the hospital until [facility] per radiology)-- resolved prior to d/c -- routine GI follow up Chronic. On PPI   Hypokalemia -replete      Medical Consultants:    PCCM  Discharge Exam:   Vitals:   02/15/23 0820 02/15/23 0900  BP: (!) 158/88   Pulse:    Resp:    Temp:    SpO2:  97%   Vitals:   02/15/23 0526 02/15/23 0756 02/15/23 0820 02/15/23 0900  BP: (!) 158/86 (!) 158/88 (!) 158/88   Pulse: 85 81    Resp: 17 18    Temp: 99.5 F (37.5 C) 98.2 F (36.8 C)    TempSrc:  Oral    SpO2: 94% 95%  97%  Weight: 56.3 kg     Height:        General exam: Appears calm and comfortable.    The results of  significant diagnostics from this hospitalization (including imaging, microbiology, ancillary and laboratory) are listed below for reference.     Procedures and Diagnostic Studies:   DG Chest Port 1 View Result Date: 02/12/2023 CLINICAL DATA:  Acute respiratory failure. EXAM: PORTABLE CHEST 1 VIEW COMPARISON:  Chest radiograph dated 02/11/2023. FINDINGS: No focal consolidation, pleural effusion, or pneumothorax. The cardiac silhouette is within normal limits. Atherosclerotic calcification of the aortic arch. No acute osseous pathology. IMPRESSION: No active disease. Electronically Signed   By: Elgie Collard M.D.   On: 02/12/2023 20:57   DG CHEST PORT 1 VIEW Result Date:  02/11/2023 CLINICAL DATA:  OG tube placement, endotracheal tube placement EXAM: PORTABLE CHEST 1 VIEW COMPARISON:  02/11/2023 FINDINGS: Endotracheal tube 5 cm above the carina. OG tube enters the stomach. Lungs clear. There is hyperinflation of the lungs. No effusions. Heart is normal size. No acute bony abnormality. IMPRESSION: Support devices in expected position as above. Hyperinflation. No active disease. Electronically Signed   By: Charlett Nose M.D.   On: 02/11/2023 21:46   DG Abd 1 View Result Date: 02/11/2023 CLINICAL DATA:  Check gastric catheter placement EXAM: ABDOMEN - 1 VIEW COMPARISON:  None Available. FINDINGS: Gastric catheter is noted with the tip in the stomach. Proximal side port lies at the gastroesophageal junction. This could be advanced deeper into the stomach. IMPRESSION: Gastric catheter as described. This could be advanced slightly deeper into the stomach. Electronically Signed   By: Alcide Clever M.D.   On: 02/11/2023 21:31   DG Chest Portable 1 View Result Date: 02/11/2023 CLINICAL DATA:  Intubation. EXAM: PORTABLE CHEST 1 VIEW COMPARISON:  Chest radiograph dated 05/22/2009. FINDINGS: Endotracheal tube with tip approximately 4 cm above the carina. Enteric tube with tip in the proximal stomach and side port in the region of the GE junction. Recommend further advancing by additional 7 cm. Mild diffuse interstitial prominence. No focal consolidation, pleural effusion, pneumothorax. The cardiac silhouette is within normal limits. No acute osseous pathology. IMPRESSION: 1. Endotracheal tube with tip approximately 4 cm above the carina. 2. Enteric tube with side port in the region of the GE junction. Recommend further advancing by additional 7 cm. Electronically Signed   By: Elgie Collard M.D.   On: 02/11/2023 18:22   DG Abdomen 1 View Result Date: 02/11/2023 CLINICAL DATA:  Intubation. EXAM: PORTABLE CHEST 1 VIEW COMPARISON:  Chest radiograph dated 05/22/2009. FINDINGS:  Endotracheal tube with tip approximately 4 cm above the carina. Enteric tube with tip in the proximal stomach and side port in the region of the GE junction. Recommend further advancing by additional 7 cm. Mild diffuse interstitial prominence. No focal consolidation, pleural effusion, pneumothorax. The cardiac silhouette is within normal limits. No acute osseous pathology. IMPRESSION: 1. Endotracheal tube with tip approximately 4 cm above the carina. 2. Enteric tube with side port in the region of the GE junction. Recommend further advancing by additional 7 cm. Electronically Signed   By: Elgie Collard M.D.   On: 02/11/2023 18:22   DG Chest Port 1 View Result Date: 02/11/2023 CLINICAL DATA:  Shortness of breath. EXAM: PORTABLE CHEST 1 VIEW COMPARISON:  10/14/2022. FINDINGS: Bilateral lung fields are clear. Bilateral costophrenic angles are clear. Normal cardio-mediastinal silhouette. No acute osseous abnormalities. The soft tissues are within normal limits. IMPRESSION: *No active disease. Electronically Signed   By: Jules Schick M.D.   On: 02/11/2023 16:46     Labs:   Basic Metabolic Panel: Recent Labs  Lab 02/11/23 1518 02/11/23 2108  02/11/23 2117 02/12/23 0058 02/12/23 2042 02/13/23 0200 02/14/23 0725 02/15/23 0621  NA 135  --    < > 137 138 138 136 137  K 4.1  --    < > 4.4 4.6 3.8 3.3* 3.3*  CL 98  --   --  105  --  99 95* 98  CO2 26  --   --  21*  --  29 28 30   GLUCOSE 191*  --   --  180*  --  129* 149* 156*  BUN 23  --   --  23  --  22 19 17   CREATININE 0.96  --   --  0.96  --  1.02 0.98 1.06  CALCIUM 9.0  --   --  8.0*  --  8.6* 8.9 8.9  MG  --  2.5*  --  2.3  --   --   --   --   PHOS  --  3.4  --   --   --   --   --   --    < > = values in this interval not displayed.   GFR Estimated Creatinine Clearance: 55.3 mL/min (by C-G formula based on SCr of 1.06 mg/dL). Liver Function Tests: Recent Labs  Lab 02/11/23 1518  AST 34  ALT 16  ALKPHOS 37*  BILITOT 0.5  PROT  7.1  ALBUMIN 3.5   No results for input(s): "LIPASE", "AMYLASE" in the last 168 hours. No results for input(s): "AMMONIA" in the last 168 hours. Coagulation profile No results for input(s): "INR", "PROTIME" in the last 168 hours.  CBC: Recent Labs  Lab 02/11/23 1518 02/11/23 2117 02/12/23 0058 02/12/23 2042 02/13/23 0200 02/14/23 0725 02/15/23 0621  WBC 7.4  --  10.3  --  8.4 5.5 4.8  NEUTROABS 6.0  --   --   --  6.6 4.0 2.9  HGB 13.8   < > 13.4 13.6 12.6* 12.8* 13.4  HCT 41.8   < > 41.8 40.0 38.6* 37.3* 38.2*  MCV 96.1  --  98.8  --  95.1 91.4 89.3  PLT 203  --  175  --  219 248 253   < > = values in this interval not displayed.   Cardiac Enzymes: No results for input(s): "CKTOTAL", "CKMB", "CKMBINDEX", "TROPONINI" in the last 168 hours. BNP: Invalid input(s): "POCBNP" CBG: Recent Labs  Lab 02/14/23 0832 02/14/23 1227 02/14/23 1710 02/14/23 2113 02/15/23 0754  GLUCAP 180* 203* 208* 169* 172*   D-Dimer No results for input(s): "DDIMER" in the last 72 hours. Hgb A1c No results for input(s): "HGBA1C" in the last 72 hours. Lipid Profile No results for input(s): "CHOL", "HDL", "LDLCALC", "TRIG", "CHOLHDL", "LDLDIRECT" in the last 72 hours. Thyroid function studies No results for input(s): "TSH", "T4TOTAL", "T3FREE", "THYROIDAB" in the last 72 hours.  Invalid input(s): "FREET3" Anemia work up No results for input(s): "VITAMINB12", "FOLATE", "FERRITIN", "TIBC", "IRON", "RETICCTPCT" in the last 72 hours. Microbiology Recent Results (from the past 240 hours)  Resp panel by RT-PCR (RSV, Flu A&B, Covid) Anterior Nasal Swab     Status: Abnormal   Collection Time: 02/11/23  2:53 PM   Specimen: Anterior Nasal Swab  Result Value Ref Range Status   SARS Coronavirus 2 by RT PCR NEGATIVE NEGATIVE Final    Comment: (NOTE) SARS-CoV-2 target nucleic acids are NOT DETECTED.  The SARS-CoV-2 RNA is generally detectable in upper respiratory specimens during the acute phase of  infection. The lowest concentration of SARS-CoV-2  viral copies this assay can detect is 138 copies/mL. A negative result does not preclude SARS-Cov-2 infection and should not be used as the sole basis for treatment or other patient management decisions. A negative result may occur with  improper specimen collection/handling, submission of specimen other than nasopharyngeal swab, presence of viral mutation(s) within the areas targeted by this assay, and inadequate number of viral copies(<138 copies/mL). A negative result must be combined with clinical observations, patient history, and epidemiological information. The expected result is Negative.  Fact Sheet for Patients:  BloggerCourse.com  Fact Sheet for Healthcare Providers:  SeriousBroker.it  This test is no t yet approved or cleared by the [place] FDA and  has been authorized for detection and/or diagnosis of SARS-CoV-2 by FDA under an Emergency Use Authorization (EUA). This EUA will remain  in effect (meaning this test can be used) for the duration of the COVID-19 declaration under Section 564(b)(1) of the Act, 21 U.S.C.section 360bbb-3(b)(1), unless the authorization is terminated  or revoked sooner.       Influenza A by PCR POSITIVE (A) NEGATIVE Final   Influenza B by PCR NEGATIVE NEGATIVE Final    Comment: (NOTE) The Xpert Xpress SARS-CoV-2/FLU/RSV plus assay is intended as an aid in the diagnosis of influenza from Nasopharyngeal swab specimens and should not be used as a sole basis for treatment. Nasal washings and aspirates are unacceptable for Xpert Xpress SARS-CoV-2/FLU/RSV testing.  Fact Sheet for Patients: BloggerCourse.com  Fact Sheet for Healthcare Providers: SeriousBroker.it  This test is not yet approved or cleared by the [place] FDA and has been authorized for detection and/or diagnosis of  SARS-CoV-2 by FDA under an Emergency Use Authorization (EUA). This EUA will remain in effect (meaning this test can be used) for the duration of the COVID-19 declaration under Section 564(b)(1) of the Act, 21 U.S.C. section 360bbb-3(b)(1), unless the authorization is terminated or revoked.     Resp Syncytial Virus by PCR NEGATIVE NEGATIVE Final    Comment: (NOTE) Fact Sheet for Patients: BloggerCourse.com  Fact Sheet for Healthcare Providers: SeriousBroker.it  This test is not yet approved or cleared by the [place] FDA and has been authorized for detection and/or diagnosis of SARS-CoV-2 by FDA under an Emergency Use Authorization (EUA). This EUA will remain in effect (meaning this test can be used) for the duration of the COVID-19 declaration under Section 564(b)(1) of the Act, 21 U.S.C. section 360bbb-3(b)(1), unless the authorization is terminated or revoked.  Performed at [facility], [address] [place], [place] [address]   Culture, Respiratory w Gram Stain (tracheal aspirate)     Status: None   Collection Time: 02/11/23  8:39 PM   Specimen: Tracheal Aspirate; Respiratory  Result Value Ref Range Status   Specimen Description TRACHEAL ASPIRATE  Final   Special Requests NONE  Final   Gram Stain   Final    FEW WBC PRESENT,BOTH PMN AND MONONUCLEAR FEW GRAM POSITIVE COCCI IN PAIRS AND CHAINS    Culture   Final    FEW HAEMOPHILUS INFLUENZAE BETA LACTAMASE NEGATIVE Performed at [facility], [address] [place], [place] [address]    Report Status 02/13/2023 FINAL  Final  Culture, blood (Routine X 2) w Reflex to ID Panel     Status: None (Preliminary result)   Collection Time: 02/11/23  9:08 PM   Specimen: BLOOD RIGHT ARM  Result Value Ref Range Status   Specimen Description BLOOD RIGHT ARM  Final   Special Requests  Final    BOTTLES DRAWN AEROBIC AND ANAEROBIC Blood Culture results may not be optimal  due to an inadequate volume of blood received in culture bottles   Culture   Final    NO GROWTH 3 DAYS Performed at [facility], [address] [place], [place] [address]    Report Status PENDING  Incomplete  Culture, blood (Routine X 2) w Reflex to ID Panel     Status: Abnormal   Collection Time: 02/11/23  9:21 PM   Specimen: BLOOD LEFT ARM  Result Value Ref Range Status   Specimen Description BLOOD LEFT ARM  Final   Special Requests   Final    BOTTLES DRAWN AEROBIC ONLY Blood Culture results may not be optimal due to an inadequate volume of blood received in culture bottles   Culture  Setup Time   Final    GRAM POSITIVE COCCI IN CLUSTERS BOTTLES DRAWN AEROBIC ONLY CRITICAL RESULT CALLED TO, READ BACK BY AND VERIFIED WITH: PHARMD [id number] AT 2020 CAREN A. , ADC    Culture (A)  Final    STAPHYLOCOCCUS EPIDERMIDIS THE SIGNIFICANCE OF ISOLATING THIS ORGANISM FROM A SINGLE SET OF BLOOD CULTURES WHEN MULTIPLE SETS ARE DRAWN IS UNCERTAIN. PLEASE NOTIFY THE MICROBIOLOGY DEPARTMENT WITHIN ONE WEEK IF SPECIATION AND SENSITIVITIES ARE REQUIRED. Performed at [facility], [address] [place], [place] [address]    Report Status 02/13/2023 FINAL  Final  Blood Culture ID Panel (Reflexed)     Status: Abnormal   Collection Time: 02/11/23  9:21 PM  Result Value Ref Range Status   Enterococcus faecalis NOT DETECTED NOT DETECTED Final   Enterococcus Faecium NOT DETECTED NOT DETECTED Final   Listeria monocytogenes NOT DETECTED NOT DETECTED Final   Staphylococcus species DETECTED (A) NOT DETECTED Final    Comment: CRITICAL RESULT CALLED TO, READ BACK BY AND VERIFIED WITH: PHARMD [id number] AT 2020 CAREN A. , ADC    Staphylococcus aureus (BCID) NOT DETECTED NOT DETECTED Final   Staphylococcus epidermidis DETECTED (A) NOT DETECTED Final    Comment: CRITICAL RESULT CALLED TO, READ BACK BY AND VERIFIED WITH: PHARMD [id number] AT 2020 CAREN A. , ADC    Staphylococcus lugdunensis NOT DETECTED  NOT DETECTED Final   Streptococcus species NOT DETECTED NOT DETECTED Final   Streptococcus agalactiae NOT DETECTED NOT DETECTED Final   Streptococcus pneumoniae NOT DETECTED NOT DETECTED Final   Streptococcus pyogenes NOT DETECTED NOT DETECTED Final   A.calcoaceticus-baumannii NOT DETECTED NOT DETECTED Final   Bacteroides fragilis NOT DETECTED NOT DETECTED Final   Enterobacterales NOT DETECTED NOT DETECTED Final   Enterobacter cloacae complex NOT DETECTED NOT DETECTED Final   Escherichia coli NOT DETECTED NOT DETECTED Final   Klebsiella aerogenes NOT DETECTED NOT DETECTED Final   Klebsiella oxytoca NOT DETECTED NOT DETECTED Final   Klebsiella pneumoniae NOT DETECTED NOT DETECTED Final   Proteus species NOT DETECTED NOT DETECTED Final   Salmonella species NOT DETECTED NOT DETECTED Final   Serratia marcescens NOT DETECTED NOT DETECTED Final   Haemophilus influenzae NOT DETECTED NOT DETECTED Final   Neisseria meningitidis NOT DETECTED NOT DETECTED Final   Pseudomonas aeruginosa NOT DETECTED NOT DETECTED Final   Stenotrophomonas maltophilia NOT DETECTED NOT DETECTED Final   Candida albicans NOT DETECTED NOT DETECTED Final   Candida auris NOT DETECTED NOT DETECTED Final   Candida glabrata NOT DETECTED NOT DETECTED Final   Candida krusei NOT DETECTED NOT DETECTED Final   Candida parapsilosis NOT DETECTED NOT DETECTED Final  Candida tropicalis NOT DETECTED NOT DETECTED Final   Cryptococcus neoformans/gattii NOT DETECTED NOT DETECTED Final   Methicillin resistance mecA/C NOT DETECTED NOT DETECTED Final    Comment: Performed at [facility], [address] [place], [place] [address]     Discharge Instructions:   Discharge Instructions     Diet Carb Modified   Complete by: As directed    Discharge instructions   Complete by: As directed    Outpatient GI follow up PCP To adjust medication doses   Increase activity slowly   Complete by: As directed       Allergies as of  02/15/2023   No Known Allergies      Medication List     PAUSE taking these medications    insulin glargine 100 UNIT/ML injection Wait to take this until your doctor or other care provider tells you to start again. Commonly known as: Lantus Inject 0.24 mLs (24 Units total) into the skin daily.       TAKE these medications    albuterol (2.5 MG/3ML) 0.083% nebulizer solution Commonly known as: PROVENTIL Take 3 mLs (2.5 mg total) by nebulization every 6 (six) hours as needed for wheezing or shortness of breath.   amoxicillin 500 MG capsule Commonly known as: AMOXIL Take 2 capsules (1,000 mg total) by mouth every 8 (eight) hours.   aspirin EC 81 MG tablet Take 1 tablet (81 mg total) by mouth daily at 6 (six) AM. Swallow whole.   carvedilol 25 MG tablet Commonly known as: COREG Take 1 tablet (25 mg total) by mouth 2 (two) times daily.   diphenhydrAMINE 25 MG tablet Commonly known as: BENADRYL Take 25 mg by mouth daily as needed for allergies.   divalproex 500 MG 24 hr tablet Commonly known as: DEPAKOTE ER Take 1 tablet (500 mg total) by mouth daily.   escitalopram 10 MG tablet Commonly known as: LEXAPRO Take 1 tablet (10 mg total) by mouth daily. Start taking on: February 16, 2023   fenofibrate 160 MG tablet Take 1 tablet (160 mg total) by mouth daily.   FreeStyle Libre 3 Sensor Misc 1 each by Does not apply route every 14 (fourteen) days. Place 1 sensor on the skin every 14 days. Use to check glucose continuously   Gvoke HypoPen 2-Pack 1 MG/0.2ML Soaj Generic drug: Glucagon Inject 1 mg into the skin as needed (hypoglycemia).   lansoprazole 30 MG capsule Commonly known as: PREVACID TAKE 1 CAPSULE BY MOUTH TWICE DAILY BEFORE A MEAL   lisinopril 20 MG tablet Commonly known as: ZESTRIL Take 1 tablet (20 mg total) by mouth 2 (two) times daily.   metFORMIN 1000 MG tablet Commonly known as: GLUCOPHAGE TAKE 1 TABLET BY MOUTH TWICE DAILY WITH A MEAL   nicotine  21 mg/24hr patch Commonly known as: NICODERM CQ - dosed in mg/24 hours Place 1 patch (21 mg total) onto the skin daily at 6 (six) AM. Start taking on: February 16, 2023   nitroGLYCERIN 0.4 MG SL tablet Commonly known as: NITROSTAT Place 1 tablet (0.4 mg total) under the tongue every 5 (five) minutes as needed for chest pain (Do not excieed more than 3 tablets in 15 minutes.).   NovoLOG 100 UNIT/ML injection Generic drug: insulin aspart Inject 14 Units into the skin 3 (three) times daily with meals.   oseltamivir 75 MG capsule Commonly known as: TAMIFLU Take 1 capsule (75 mg total) by mouth 2 (two) times daily.   predniSONE 20 MG tablet Commonly  known as: DELTASONE Take 2 tablets (40 mg total) by mouth daily with breakfast. Start taking on: February 16, 2023   rosuvastatin 40 MG tablet Commonly known as: CRESTOR Take 1 tablet by mouth once daily   Semaglutide (1 MG/DOSE) 4 MG/3ML Sopn Inject 1 mg as directed once a week.   Trelegy Ellipta 100-62.5-25 MCG/ACT Aepb Generic drug: Fluticasone-Umeclidin-Vilant Inhale 1 puff into the lungs daily.          Time coordinating discharge: 45 min  Signed:  Joseph Art DO  [facility] 02/15/2023, 10:09 AM

## 2023-02-15 NOTE — Discharge Summary (Signed)
Physician Discharge Summary  Jimmy Duran:[id number] DOB: 03-15-1955 DOA: 02/11/2023  PCP: [name], MD  Admit date: 02/11/2023 Discharge date: 02/15/2023  Admitted From: home Discharge disposition: home   Recommendations for Outpatient Follow-Up:   Follow up with pulmonary-- please refer Patient to fill out forms for help with inhalers Stop smoking  Outpatient follow up with GI   Discharge Diagnosis:   Principal Problem:   Respiratory failure (HCC) Active Problems:   COPD (chronic obstructive pulmonary disease) (HCC)   Seizure, late effect of stroke (HCC)   Acute respiratory failure with hypoxia (HCC)   Influenza A    Discharge Condition: Improved.  Diet recommendation: Low sodium, heart healthy.  Wound care: None.  Code status: Full.   History of Present Illness:   [name] is a 67 y/o gentleman with a PMH significant for HTN, CAD, NSTEMI, PAD, COPD,  TIA/Stroke, Seizures, Diabetes, who presented to the hospital with several days of SOB, cough, decreased appetite. He had been around a family member with the flu recently. He was smoking until 4 days ago. Per PTA med list, he was not on bronchodilators as an outpatient. He was hypoxic in the ED and initially started on 2L [place] but had persistent desaturation and confusion. He was upgraded to BiPAP but over about an hour continued to deteriorate with worse confusion and hypoxia into the 60's, prompting intubation in the ED. Post-intubation he required vasopressors despite 2 liters IVF bolus to tolerate sedation. Viral panel + Influenza A   PCCM consulted for transfer to [facility] for admission   Hospital Course by Problem:   Acute hypoxic Respiratory Failure 2/2 to Influenza A COPD exacerbation Improving.Pt presented to ED with 4 days of worsening URI symptoms. Resp panel positive for Flu A. Given severe hypoxia, pt intubated in the ED. -extubated and tx to floor -home O2 study-- no need for  O2    Tobacco use disorder Smokes 2 ppd, counseled on smoking cessation. Wants to start NRT so will start that. He states his smoking is to calm his nerves as he has a lot of anxiety. This has been present for >6 months. He meets criteria for GAD, so will start him on Lexapro 10 mg  GAD: started on lexapro 10 mg daily-- titrate outpatient   Seizure Disorder Chronic. On Valproate. Continued here.    HTN Chronic. -resume home meds   HLD Chronic. On statin, fibrates and aspirin.   T2DM Chronic. -resume home meds at lower dose   GERD-- c/o feeling like pill is stuck -- check dg esophagus (unfortunately not able to be done in the hospital until [facility] per radiology)-- resolved prior to d/c -- routine GI follow up Chronic. On PPI   Hypokalemia -replete      Medical Consultants:    PCCM  Discharge Exam:   Vitals:   02/15/23 0820 02/15/23 0900  BP: (!) 158/88   Pulse:    Resp:    Temp:    SpO2:  97%   Vitals:   02/15/23 0526 02/15/23 0756 02/15/23 0820 02/15/23 0900  BP: (!) 158/86 (!) 158/88 (!) 158/88   Pulse: 85 81    Resp: 17 18    Temp: 99.5 F (37.5 C) 98.2 F (36.8 C)    TempSrc:  Oral    SpO2: 94% 95%  97%  Weight: 56.3 kg     Height:        General exam: Appears calm and comfortable.  The results of significant diagnostics from this hospitalization (including imaging, microbiology, ancillary and laboratory) are listed below for reference.     Procedures and Diagnostic Studies:   DG Chest Port 1 View Result Date: 02/12/2023 CLINICAL DATA:  Acute respiratory failure. EXAM: PORTABLE CHEST 1 VIEW COMPARISON:  Chest radiograph dated 02/11/2023. FINDINGS: No focal consolidation, pleural effusion, or pneumothorax. The cardiac silhouette is within normal limits. Atherosclerotic calcification of the aortic arch. No acute osseous pathology. IMPRESSION: No active disease. Electronically Signed   By: Elgie Collard M.D.   On: 02/12/2023 20:57   DG  CHEST PORT 1 VIEW Result Date: 02/11/2023 CLINICAL DATA:  OG tube placement, endotracheal tube placement EXAM: PORTABLE CHEST 1 VIEW COMPARISON:  02/11/2023 FINDINGS: Endotracheal tube 5 cm above the carina. OG tube enters the stomach. Lungs clear. There is hyperinflation of the lungs. No effusions. Heart is normal size. No acute bony abnormality. IMPRESSION: Support devices in expected position as above. Hyperinflation. No active disease. Electronically Signed   By: Charlett Nose M.D.   On: 02/11/2023 21:46   DG Abd 1 View Result Date: 02/11/2023 CLINICAL DATA:  Check gastric catheter placement EXAM: ABDOMEN - 1 VIEW COMPARISON:  None Available. FINDINGS: Gastric catheter is noted with the tip in the stomach. Proximal side port lies at the gastroesophageal junction. This could be advanced deeper into the stomach. IMPRESSION: Gastric catheter as described. This could be advanced slightly deeper into the stomach. Electronically Signed   By: Alcide Clever M.D.   On: 02/11/2023 21:31   DG Chest Portable 1 View Result Date: 02/11/2023 CLINICAL DATA:  Intubation. EXAM: PORTABLE CHEST 1 VIEW COMPARISON:  Chest radiograph dated 05/22/2009. FINDINGS: Endotracheal tube with tip approximately 4 cm above the carina. Enteric tube with tip in the proximal stomach and side port in the region of the GE junction. Recommend further advancing by additional 7 cm. Mild diffuse interstitial prominence. No focal consolidation, pleural effusion, pneumothorax. The cardiac silhouette is within normal limits. No acute osseous pathology. IMPRESSION: 1. Endotracheal tube with tip approximately 4 cm above the carina. 2. Enteric tube with side port in the region of the GE junction. Recommend further advancing by additional 7 cm. Electronically Signed   By: Elgie Collard M.D.   On: 02/11/2023 18:22   DG Abdomen 1 View Result Date: 02/11/2023 CLINICAL DATA:  Intubation. EXAM: PORTABLE CHEST 1 VIEW COMPARISON:  Chest radiograph dated  05/22/2009. FINDINGS: Endotracheal tube with tip approximately 4 cm above the carina. Enteric tube with tip in the proximal stomach and side port in the region of the GE junction. Recommend further advancing by additional 7 cm. Mild diffuse interstitial prominence. No focal consolidation, pleural effusion, pneumothorax. The cardiac silhouette is within normal limits. No acute osseous pathology. IMPRESSION: 1. Endotracheal tube with tip approximately 4 cm above the carina. 2. Enteric tube with side port in the region of the GE junction. Recommend further advancing by additional 7 cm. Electronically Signed   By: Elgie Collard M.D.   On: 02/11/2023 18:22   DG Chest Port 1 View Result Date: 02/11/2023 CLINICAL DATA:  Shortness of breath. EXAM: PORTABLE CHEST 1 VIEW COMPARISON:  10/14/2022. FINDINGS: Bilateral lung fields are clear. Bilateral costophrenic angles are clear. Normal cardio-mediastinal silhouette. No acute osseous abnormalities. The soft tissues are within normal limits. IMPRESSION: *No active disease. Electronically Signed   By: Jules Schick M.D.   On: 02/11/2023 16:46     Labs:   Basic Metabolic Panel: Recent Labs  Lab 02/11/23  1518 02/11/23 2108 02/11/23 2117 02/12/23 0058 02/12/23 2042 02/13/23 0200 02/14/23 0725 02/15/23 0621  NA 135  --    < > 137 138 138 136 137  K 4.1  --    < > 4.4 4.6 3.8 3.3* 3.3*  CL 98  --   --  105  --  99 95* 98  CO2 26  --   --  21*  --  29 28 30   GLUCOSE 191*  --   --  180*  --  129* 149* 156*  BUN 23  --   --  23  --  22 19 17   CREATININE 0.96  --   --  0.96  --  1.02 0.98 1.06  CALCIUM 9.0  --   --  8.0*  --  8.6* 8.9 8.9  MG  --  2.5*  --  2.3  --   --   --   --   PHOS  --  3.4  --   --   --   --   --   --    < > = values in this interval not displayed.   GFR Estimated Creatinine Clearance: 55.3 mL/min (by C-G formula based on SCr of 1.06 mg/dL). Liver Function Tests: Recent Labs  Lab 02/11/23 1518  AST 34  ALT 16  ALKPHOS 37*   BILITOT 0.5  PROT 7.1  ALBUMIN 3.5   No results for input(s): "LIPASE", "AMYLASE" in the last 168 hours. No results for input(s): "AMMONIA" in the last 168 hours. Coagulation profile No results for input(s): "INR", "PROTIME" in the last 168 hours.  CBC: Recent Labs  Lab 02/11/23 1518 02/11/23 2117 02/12/23 0058 02/12/23 2042 02/13/23 0200 02/14/23 0725 02/15/23 0621  WBC 7.4  --  10.3  --  8.4 5.5 4.8  NEUTROABS 6.0  --   --   --  6.6 4.0 2.9  HGB 13.8   < > 13.4 13.6 12.6* 12.8* 13.4  HCT 41.8   < > 41.8 40.0 38.6* 37.3* 38.2*  MCV 96.1  --  98.8  --  95.1 91.4 89.3  PLT 203  --  175  --  219 248 253   < > = values in this interval not displayed.   Cardiac Enzymes: No results for input(s): "CKTOTAL", "CKMB", "CKMBINDEX", "TROPONINI" in the last 168 hours. BNP: Invalid input(s): "POCBNP" CBG: Recent Labs  Lab 02/14/23 1227 02/14/23 1710 02/14/23 2113 02/15/23 0754 02/15/23 1209  GLUCAP 203* 208* 169* 172* 259*   D-Dimer No results for input(s): "DDIMER" in the last 72 hours. Hgb A1c No results for input(s): "HGBA1C" in the last 72 hours. Lipid Profile No results for input(s): "CHOL", "HDL", "LDLCALC", "TRIG", "CHOLHDL", "LDLDIRECT" in the last 72 hours. Thyroid function studies No results for input(s): "TSH", "T4TOTAL", "T3FREE", "THYROIDAB" in the last 72 hours.  Invalid input(s): "FREET3" Anemia work up No results for input(s): "VITAMINB12", "FOLATE", "FERRITIN", "TIBC", "IRON", "RETICCTPCT" in the last 72 hours. Microbiology Recent Results (from the past 240 hours)  Resp panel by RT-PCR (RSV, Flu A&B, Covid) Anterior Nasal Swab     Status: Abnormal   Collection Time: 02/11/23  2:53 PM   Specimen: Anterior Nasal Swab  Result Value Ref Range Status   SARS Coronavirus 2 by RT PCR NEGATIVE NEGATIVE Final    Comment: (NOTE) SARS-CoV-2 target nucleic acids are NOT DETECTED.  The SARS-CoV-2 RNA is generally detectable in upper respiratory specimens during  the acute phase of infection. The lowest  concentration of SARS-CoV-2 viral copies this assay can detect is 138 copies/mL. A negative result does not preclude SARS-Cov-2 infection and should not be used as the sole basis for treatment or other patient management decisions. A negative result may occur with  improper specimen collection/handling, submission of specimen other than nasopharyngeal swab, presence of viral mutation(s) within the areas targeted by this assay, and inadequate number of viral copies(<138 copies/mL). A negative result must be combined with clinical observations, patient history, and epidemiological information. The expected result is Negative.  Fact Sheet for Patients:  BloggerCourse.com  Fact Sheet for Healthcare Providers:  SeriousBroker.it  This test is no t yet approved or cleared by the [place] FDA and  has been authorized for detection and/or diagnosis of SARS-CoV-2 by FDA under an Emergency Use Authorization (EUA). This EUA will remain  in effect (meaning this test can be used) for the duration of the COVID-19 declaration under Section 564(b)(1) of the Act, 21 U.S.C.section 360bbb-3(b)(1), unless the authorization is terminated  or revoked sooner.       Influenza A by PCR POSITIVE (A) NEGATIVE Final   Influenza B by PCR NEGATIVE NEGATIVE Final    Comment: (NOTE) The Xpert Xpress SARS-CoV-2/FLU/RSV plus assay is intended as an aid in the diagnosis of influenza from Nasopharyngeal swab specimens and should not be used as a sole basis for treatment. Nasal washings and aspirates are unacceptable for Xpert Xpress SARS-CoV-2/FLU/RSV testing.  Fact Sheet for Patients: BloggerCourse.com  Fact Sheet for Healthcare Providers: SeriousBroker.it  This test is not yet approved or cleared by the [place] FDA and has been authorized for detection and/or  diagnosis of SARS-CoV-2 by FDA under an Emergency Use Authorization (EUA). This EUA will remain in effect (meaning this test can be used) for the duration of the COVID-19 declaration under Section 564(b)(1) of the Act, 21 U.S.C. section 360bbb-3(b)(1), unless the authorization is terminated or revoked.     Resp Syncytial Virus by PCR NEGATIVE NEGATIVE Final    Comment: (NOTE) Fact Sheet for Patients: BloggerCourse.com  Fact Sheet for Healthcare Providers: SeriousBroker.it  This test is not yet approved or cleared by the [place] FDA and has been authorized for detection and/or diagnosis of SARS-CoV-2 by FDA under an Emergency Use Authorization (EUA). This EUA will remain in effect (meaning this test can be used) for the duration of the COVID-19 declaration under Section 564(b)(1) of the Act, 21 U.S.C. section 360bbb-3(b)(1), unless the authorization is terminated or revoked.  Performed at [facility] [address] [place], [place] [address]   Culture, Respiratory w Gram Stain (tracheal aspirate)     Status: None   Collection Time: 02/11/23  8:39 PM   Specimen: Tracheal Aspirate; Respiratory  Result Value Ref Range Status   Specimen Description TRACHEAL ASPIRATE  Final   Special Requests NONE  Final   Gram Stain   Final    FEW WBC PRESENT,BOTH PMN AND MONONUCLEAR FEW GRAM POSITIVE COCCI IN PAIRS AND CHAINS    Culture   Final    FEW HAEMOPHILUS INFLUENZAE BETA LACTAMASE NEGATIVE Performed at [facility], [address] [place], [place] [address]    Report Status 02/13/2023 FINAL  Final  Culture, blood (Routine X 2) w Reflex to ID Panel     Status: None (Preliminary result)   Collection Time: 02/11/23  9:08 PM   Specimen: BLOOD RIGHT ARM  Result Value Ref Range Status   Specimen Description BLOOD RIGHT ARM  Final  Special Requests   Final    BOTTLES DRAWN AEROBIC AND ANAEROBIC Blood Culture results may not  be optimal due to an inadequate volume of blood received in culture bottles   Culture   Final    NO GROWTH 4 DAYS Performed at [facility], [address], [place], [place] [address]    Report Status PENDING  Incomplete  Culture, blood (Routine X 2) w Reflex to ID Panel     Status: Abnormal   Collection Time: 02/11/23  9:21 PM   Specimen: BLOOD LEFT ARM  Result Value Ref Range Status   Specimen Description BLOOD LEFT ARM  Final   Special Requests   Final    BOTTLES DRAWN AEROBIC ONLY Blood Culture results may not be optimal due to an inadequate volume of blood received in culture bottles   Culture  Setup Time   Final    GRAM POSITIVE COCCI IN CLUSTERS BOTTLES DRAWN AEROBIC ONLY CRITICAL RESULT CALLED TO, READ BACK BY AND VERIFIED WITH: PHARMD [id number] AT 2020 CAREN A. , ADC    Culture (A)  Final    STAPHYLOCOCCUS EPIDERMIDIS THE SIGNIFICANCE OF ISOLATING THIS ORGANISM FROM A SINGLE SET OF BLOOD CULTURES WHEN MULTIPLE SETS ARE DRAWN IS UNCERTAIN. PLEASE NOTIFY THE MICROBIOLOGY DEPARTMENT WITHIN ONE WEEK IF SPECIATION AND SENSITIVITIES ARE REQUIRED. Performed at [facility], [address], [place], [place] [address]    Report Status 02/13/2023 FINAL  Final  Blood Culture ID Panel (Reflexed)     Status: Abnormal   Collection Time: 02/11/23  9:21 PM  Result Value Ref Range Status   Enterococcus faecalis NOT DETECTED NOT DETECTED Final   Enterococcus Faecium NOT DETECTED NOT DETECTED Final   Listeria monocytogenes NOT DETECTED NOT DETECTED Final   Staphylococcus species DETECTED (A) NOT DETECTED Final    Comment: CRITICAL RESULT CALLED TO, READ BACK BY AND VERIFIED WITH: PHARMD [id number] AT 2020 CAREN A. , ADC    Staphylococcus aureus (BCID) NOT DETECTED NOT DETECTED Final   Staphylococcus epidermidis DETECTED (A) NOT DETECTED Final    Comment: CRITICAL RESULT CALLED TO, READ BACK BY AND VERIFIED WITH: PHARMD [id number] AT 2020 CAREN A. , ADC    Staphylococcus lugdunensis NOT  DETECTED NOT DETECTED Final   Streptococcus species NOT DETECTED NOT DETECTED Final   Streptococcus agalactiae NOT DETECTED NOT DETECTED Final   Streptococcus pneumoniae NOT DETECTED NOT DETECTED Final   Streptococcus pyogenes NOT DETECTED NOT DETECTED Final   A.calcoaceticus-baumannii NOT DETECTED NOT DETECTED Final   Bacteroides fragilis NOT DETECTED NOT DETECTED Final   Enterobacterales NOT DETECTED NOT DETECTED Final   Enterobacter cloacae complex NOT DETECTED NOT DETECTED Final   Escherichia coli NOT DETECTED NOT DETECTED Final   Klebsiella aerogenes NOT DETECTED NOT DETECTED Final   Klebsiella oxytoca NOT DETECTED NOT DETECTED Final   Klebsiella pneumoniae NOT DETECTED NOT DETECTED Final   Proteus species NOT DETECTED NOT DETECTED Final   Salmonella species NOT DETECTED NOT DETECTED Final   Serratia marcescens NOT DETECTED NOT DETECTED Final   Haemophilus influenzae NOT DETECTED NOT DETECTED Final   Neisseria meningitidis NOT DETECTED NOT DETECTED Final   Pseudomonas aeruginosa NOT DETECTED NOT DETECTED Final   Stenotrophomonas maltophilia NOT DETECTED NOT DETECTED Final   Candida albicans NOT DETECTED NOT DETECTED Final   Candida auris NOT DETECTED NOT DETECTED Final   Candida glabrata NOT DETECTED NOT DETECTED Final   Candida krusei NOT DETECTED NOT DETECTED Final   Candida parapsilosis NOT DETECTED  NOT DETECTED Final   Candida tropicalis NOT DETECTED NOT DETECTED Final   Cryptococcus neoformans/gattii NOT DETECTED NOT DETECTED Final   Methicillin resistance mecA/C NOT DETECTED NOT DETECTED Final    Comment: Performed at [facility], [address], [place], [place] [address]     Discharge Instructions:   Discharge Instructions     Diet Carb Modified   Complete by: As directed    Discharge instructions   Complete by: As directed    Outpatient GI follow up PCP To adjust medication doses   Increase activity slowly   Complete by: As directed       Allergies  as of 02/15/2023   No Known Allergies      Medication List     PAUSE taking these medications    insulin glargine 100 UNIT/ML injection Wait to take this until your doctor or other care provider tells you to start again. Commonly known as: Lantus Inject 0.24 mLs (24 Units total) into the skin daily.       TAKE these medications    albuterol (2.5 MG/3ML) 0.083% nebulizer solution Commonly known as: PROVENTIL Take 3 mLs (2.5 mg total) by nebulization every 6 (six) hours as needed for wheezing or shortness of breath.   amoxicillin 500 MG capsule Commonly known as: AMOXIL Take 2 capsules (1,000 mg total) by mouth every 8 (eight) hours.   aspirin EC 81 MG tablet Take 1 tablet (81 mg total) by mouth daily at 6 (six) AM. Swallow whole.   carvedilol 25 MG tablet Commonly known as: COREG Take 1 tablet (25 mg total) by mouth 2 (two) times daily.   diphenhydrAMINE 25 MG tablet Commonly known as: BENADRYL Take 25 mg by mouth daily as needed for allergies.   divalproex 500 MG 24 hr tablet Commonly known as: DEPAKOTE ER Take 1 tablet (500 mg total) by mouth daily.   escitalopram 10 MG tablet Commonly known as: LEXAPRO Take 1 tablet (10 mg total) by mouth daily. Start taking on: February 16, 2023   fenofibrate 160 MG tablet Take 1 tablet (160 mg total) by mouth daily.   FreeStyle Libre 3 Sensor Misc 1 each by Does not apply route every 14 (fourteen) days. Place 1 sensor on the skin every 14 days. Use to check glucose continuously   Gvoke HypoPen 2-Pack 1 MG/0.2ML Soaj Generic drug: Glucagon Inject 1 mg into the skin as needed (hypoglycemia).   lansoprazole 30 MG capsule Commonly known as: PREVACID TAKE 1 CAPSULE BY MOUTH TWICE DAILY BEFORE A MEAL   lisinopril 20 MG tablet Commonly known as: ZESTRIL Take 1 tablet (20 mg total) by mouth 2 (two) times daily.   metFORMIN 1000 MG tablet Commonly known as: GLUCOPHAGE TAKE 1 TABLET BY MOUTH TWICE DAILY WITH A MEAL    nicotine 21 mg/24hr patch Commonly known as: NICODERM CQ - dosed in mg/24 hours Place 1 patch (21 mg total) onto the skin daily at 6 (six) AM. Start taking on: February 16, 2023   nitroGLYCERIN 0.4 MG SL tablet Commonly known as: NITROSTAT Place 1 tablet (0.4 mg total) under the tongue every 5 (five) minutes as needed for chest pain (Do not excieed more than 3 tablets in 15 minutes.).   NovoLOG 100 UNIT/ML injection Generic drug: insulin aspart Inject 14 Units into the skin 3 (three) times daily with meals.   oseltamivir 75 MG capsule Commonly known as: TAMIFLU Take 1 capsule (75 mg total) by mouth 2 (two) times daily.  predniSONE 20 MG tablet Commonly known as: DELTASONE Take 2 tablets (40 mg total) by mouth daily with breakfast. Start taking on: February 16, 2023   rosuvastatin 40 MG tablet Commonly known as: CRESTOR Take 1 tablet by mouth once daily   Semaglutide (1 MG/DOSE) 4 MG/3ML Sopn Inject 1 mg as directed once a week.   Trelegy Ellipta 100-62.5-25 MCG/ACT Aepb Generic drug: Fluticasone-Umeclidin-Vilant Inhale 1 puff into the lungs daily.        Follow-up Information     [name], MD Follow up in 1 week(s).   Specialty: Internal Medicine Why: for help with getting inhaler (wife has paperwork) Contact information: [address] [place] [address] [phone]                  Time coordinating discharge: 45 min  Signed:  Joseph Art DO  [facility] 02/15/2023, 5:05 PM

## 2023-02-15 NOTE — Plan of Care (Signed)

## 2023-02-16 LAB — CULTURE, BLOOD (ROUTINE X 2): Culture: NO GROWTH

## 2023-02-17 ENCOUNTER — Telehealth: Payer: Self-pay

## 2023-02-17 NOTE — Transitions of Care (Post Inpatient/ED Visit) (Signed)
02/17/2023  Name: Jimmy Duran MRN: [id number] DOB: 1955/08/07  Today's TOC FU Call Status: Today's TOC FU Call Status:: Successful TOC FU Call Completed TOC FU Call Complete Date: 02/17/23 (call completed with wife) Patient's Name and Date of Birth confirmed.  Transition Care Management Follow-up Telephone Call Date of Discharge: 02/15/23 Discharge Facility: [facility]) Type of Discharge: Inpatient Admission Primary Inpatient Discharge Diagnosis:: 'influenza A" How have you been since you were released from the hospital?: Better (wife voices pt doing well-"breathing better-coughing up clear sputum-taking abxs and meds as ordered"-slept well last night-appetite good-cbgs have been in the low 100s, pt up walking & moving around) Any questions or concerns?: No  Items Reviewed: Did you receive and understand the discharge instructions provided?: Yes Medications obtained,verified, and reconciled?: Yes (Medications Reviewed) (reviewed meds with wife-she is going to get Trelegy today-they have financial assistance paperwork to complete and return for possible med assistance instructions for 'Lantus on pause" until cleared to resume by MD & to alert MD of any abnormal cbgs) Any new allergies since your discharge?: No Dietary orders reviewed?: Yes Type of Diet Ordered:: carb modified/low salt/heart healthy Do you have support at home?: Yes People in Home: spouse Name of Support/Comfort Primary Source: evelyn-wife assists pt as needed  Medications Reviewed Today: Medications Reviewed Today     Reviewed by Charlyn Minerva, RN (Registered Nurse) on 02/17/23 at 1239  Med List Status: <None>   Medication Order Taking? Sig Documenting Provider Last Dose Status Informant  albuterol (PROVENTIL) (2.5 MG/3ML) 0.083% nebulizer solution [id number] Yes Take 3 mLs (2.5 mg total) by nebulization every 6 (six) hours as needed for wheezing or shortness of breath. [name], MD  02/16/2023 Active Spouse/Significant Other  amoxicillin (AMOXIL) 500 MG capsule [id number] Yes Take 2 capsules (1,000 mg total) by mouth every 8 (eight) hours. Joseph Art, DO 02/17/2023 Active   aspirin EC 81 MG tablet [id number] Yes Take 1 tablet (81 mg total) by mouth daily at 6 (six) AM. Swallow whole. Dara Lords, PA-C 02/17/2023 Active Spouse/Significant Other  carvedilol (COREG) 25 MG tablet [id number] Yes Take 1 tablet (25 mg total) by mouth 2 (two) times daily. Sharlene Dory, NP 02/17/2023 Active Spouse/Significant Other  Continuous Blood Gluc Sensor (FREESTYLE LIBRE 3 SENSOR) MISC [id number] Yes 1 each by Does not apply route every 14 (fourteen) days. Place 1 sensor on the skin every 14 days. Use to check glucose continuously [name], MD 02/17/2023 Active Spouse/Significant Other  diphenhydrAMINE (BENADRYL) 25 MG tablet [id number] Yes Take 25 mg by mouth daily as needed for allergies.  [provider] Taking Active Spouse/Significant Other  divalproex (DEPAKOTE ER) 500 MG 24 hr tablet [id number] Yes Take 1 tablet (500 mg total) by mouth daily. Ihor Austin, NP 02/17/2023 Active Spouse/Significant Other  escitalopram (LEXAPRO) 10 MG tablet [id number] Yes Take 1 tablet (10 mg total) by mouth daily. Joseph Art, DO 02/16/2023 Active   fenofibrate 160 MG tablet [id number] Yes Take 1 tablet (160 mg total) by mouth daily. Sharlene Dory, NP Taking Active Spouse/Significant Other  Fluticasone-Umeclidin-Vilant (TRELEGY ELLIPTA) 100-62.5-25 MCG/ACT AEPB [id number]  Inhale 1 puff into the lungs daily. Gwenevere Abbot, MD  Active            Med Note Modena Jansky Feb 17, 2023 12:37 PM) Wife voices she is going to pick up med today  Glucagon (GVOKE HYPOPEN 2-PACK) 1 MG/0.2ML Ivory Broad [id number]  Inject 1 mg into the skin as  needed (hypoglycemia). [name], MD  Active Spouse/Significant Other           Med Note Ephriam Knuckles, TAMMY   Tue May 28, 2022 10:18 AM)     insulin glargine (LANTUS) 100 UNIT/ML injection [id number] No Inject 0.24 mLs (24 Units total) into the skin daily.  Patient not taking: Reported on 02/17/2023   [name], MD Not Taking Active Spouse/Significant Other  lansoprazole (PREVACID) 30 MG capsule [id number] Yes TAKE 1 CAPSULE BY MOUTH TWICE DAILY BEFORE A MEAL Corbin Ade, MD 02/17/2023 Active Spouse/Significant Other  lisinopril (ZESTRIL) 20 MG tablet [id number] Yes Take 1 tablet (20 mg total) by mouth 2 (two) times daily. Sharlene Dory, NP 02/17/2023 Active Spouse/Significant Other  metFORMIN (GLUCOPHAGE) 1000 MG tablet [id number] Yes TAKE 1 TABLET BY MOUTH TWICE DAILY WITH A MEAL [name], MD 02/17/2023 Active Spouse/Significant Other  nicotine (NICODERM CQ - DOSED IN MG/24 HOURS) 21 mg/24hr patch [id number] Yes Place 1 patch (21 mg total) onto the skin daily at 6 (six) AM. Joseph Art, DO 02/17/2023 Active   nitroGLYCERIN (NITROSTAT) 0.4 MG SL tablet [id number]  Place 1 tablet (0.4 mg total) under the tongue every 5 (five) minutes as needed for chest pain (Do not excieed more than 3 tablets in 15 minutes.). Sharlene Dory, NP  Expired 02/11/23 2359 Spouse/Significant Other  NOVOLOG 100 UNIT/ML injection [id number] Yes Inject 14 Units into the skin 3 (three) times daily with meals. [name], MD Taking Active Spouse/Significant Other  oseltamivir (TAMIFLU) 75 MG capsule [id number] Yes Take 1 capsule (75 mg total) by mouth 2 (two) times daily. Joseph Art, DO 02/17/2023 Active   predniSONE (DELTASONE) 20 MG tablet [id number] Yes Take 2 tablets (40 mg total) by mouth daily with breakfast. Joseph Art, DO 02/17/2023 Active   rosuvastatin (CRESTOR) 40 MG tablet [id number] Yes Take 1 tablet by mouth once daily Jonelle Sidle, MD 02/16/2023 Active Spouse/Significant Other  Semaglutide, 1 MG/DOSE, 4 MG/3ML SOPN [id number] Yes Inject 1 mg as directed once a week. [name], MD Taking Active  Spouse/Significant Other            Home Care and Equipment/Supplies: Were Home Health Services Ordered?: NA Any new equipment or medical supplies ordered?: NA  Functional Questionnaire: Do you need assistance with bathing/showering or dressing?: No Do you need assistance with meal preparation?: No Do you need assistance with eating?: No Do you have difficulty maintaining continence: No Do you need assistance with getting out of bed/getting out of a chair/moving?: No Do you have difficulty managing or taking your medications?: Yes (wife manages meds for pt)  Follow up appointments reviewed: PCP Follow-up appointment confirmed?: Yes Date of PCP follow-up appointment?: 02/20/23 Follow-up Provider: Dr. Durwin Nora Specialist [facility] Follow-up appointment confirmed?: No Reason Specialist Follow-Up Not Confirmed: Patient has Specialist Provider Number and will Call for Appointment (wife aware of GI follow up needed and will call office today to make appt) Do you need transportation to your follow-up appointment?: No Do you understand care options if your condition(s) worsen?: Yes-patient verbalized understanding  SDOH Interventions Today    Flowsheet Row Most Recent Value  SDOH Interventions   Food Insecurity Interventions Intervention Not Indicated  Housing Interventions Intervention Not Indicated  Transportation Interventions Intervention Not Indicated  Utilities Interventions Intervention Not Indicated       TOC interventions discussed/reviewed: -Doctor visit discussed/reviewed -PCP -Arranged PCP f/u appt within 7 days/Care guide scheduled -Doctor visits discussed/reviewed-Specialist -Provided Verbal Education: 30-day  TOC program, nutrition, meds & their functions, symptom mgmt., fall/safety measures in the home -Disease mgmt. discussed/reviewed: DM mgmt -Provided with RN CM contact info-advised to call for any questions/concerns   Antionette Fairy, RN,BSN,CCM RN Care  Manager Transitions of Care  [place]  Direct Phone: [phone] Toll Free: [phone] Fax: [phone]

## 2023-02-20 ENCOUNTER — Ambulatory Visit: Payer: Medicare PPO | Admitting: Internal Medicine

## 2023-02-20 ENCOUNTER — Encounter: Payer: Self-pay | Admitting: Internal Medicine

## 2023-02-20 ENCOUNTER — Other Ambulatory Visit: Payer: Self-pay

## 2023-02-20 VITALS — BP 118/74 | HR 80 | Ht 67.0 in | Wt 129.0 lb

## 2023-02-20 DIAGNOSIS — J449 Chronic obstructive pulmonary disease, unspecified: Secondary | ICD-10-CM | POA: Diagnosis not present

## 2023-02-20 DIAGNOSIS — F411 Generalized anxiety disorder: Secondary | ICD-10-CM | POA: Insufficient documentation

## 2023-02-20 DIAGNOSIS — J9601 Acute respiratory failure with hypoxia: Secondary | ICD-10-CM | POA: Diagnosis not present

## 2023-02-20 DIAGNOSIS — E876 Hypokalemia: Secondary | ICD-10-CM | POA: Diagnosis not present

## 2023-02-20 DIAGNOSIS — E782 Mixed hyperlipidemia: Secondary | ICD-10-CM | POA: Diagnosis not present

## 2023-02-20 MED ORDER — BREZTRI AEROSPHERE 160-9-4.8 MCG/ACT IN AERO: 2.0000 | INHALATION_SPRAY | Freq: Two times a day (BID) | RESPIRATORY_TRACT | 11 refills | Status: DC

## 2023-02-20 MED ORDER — BREZTRI AEROSPHERE 160-9-4.8 MCG/ACT IN AERO: 2.0000 | INHALATION_SPRAY | Freq: Two times a day (BID) | RESPIRATORY_TRACT | Status: DC

## 2023-02-20 NOTE — Assessment & Plan Note (Signed)
Presenting today for hospital follow-up in the setting of recent admission 12/10-12/14 for acute hypoxic respiratory failure with COPD exacerbation secondary to influenza A.  Extubated by day 2 of hospital admission and discharge 12/14.  Pulmonary exam is unremarkable today.  Respiratory status has returned baseline.

## 2023-02-20 NOTE — Patient Instructions (Signed)
It was a pleasure to see you today.  Thank you for giving [name] the opportunity to be involved in your care.  Below is a brief recap of your visit and next steps.  We will plan to see you again in 03/18/23  [facility] follow up today Start Breztri. Discontinue Trelegy Repeat labs Follow up in January as scheduled

## 2023-02-20 NOTE — Assessment & Plan Note (Signed)
Previously prescribed Trelegy but he is not able to afford it currently.  Instead, he has been using his albuterol inhaler 4-5 times per day. -Discontinue Trelegy in favor of Breztri.  Samples provided today.  Continue as needed use of albuterol.

## 2023-02-20 NOTE — Addendum Note (Signed)
Addended by: Juliane Lack D on: 02/20/2023 03:15 PM   Modules accepted: Orders

## 2023-02-20 NOTE — Assessment & Plan Note (Signed)
K+ 3.3 at the time of hospital discharge.  Repeat BMP ordered today.

## 2023-02-20 NOTE — Progress Notes (Signed)
Established Patient Office Visit  Subjective   Patient ID: Jimmy Duran, male    DOB: Sep 09, 1955  Age: 67 y.o. MRN: [id number]  Chief Complaint  Patient presents with   Hospitalization Follow-up    Patient was at [facility] from 12/10-12/14 with the flu. States he is feeling better since discharge.    Jimmy Duran returns to care today for hospital follow-up.  He was admitted to [facility] 12/10 - 12/14 in the setting of acute hypoxic respiratory failure secondary to influenza A.  Initially placed on supplemental oxygen, then BiPAP, and ultimately required mechanical ventilation.  Treated with Tamiflu, empiric antibiotics, and steroids.  Extubated by day 2 of admission and transferred to floor.  Discharged on Lexapro for management of general anxiety disorder.  Jimmy Duran reports feeling well today.  His respiratory status has significantly improved.  He does not have any additional concerns to discuss today.  Past Medical History:  Diagnosis Date   AAA (abdominal aortic aneurysm) (HCC)    Arthritis    Carotid artery disease (HCC)    Cataract    Colon polyp    COPD (chronic obstructive pulmonary disease) (HCC)    Coronary artery disease    Nonobstructive   Essential hypertension    Focal seizure (HCC) 10/15/2022   GERD (gastroesophageal reflux disease)    Hemorrhoids    History of TIA (transient ischemic attack)    Low back pain    Lumbar radiculopathy    Mixed hyperlipidemia    Palpitations    Peripheral vascular disease (HCC)    Shingles 09/23/2019   Stroke (HCC)    Tobacco dependence    Type 2 diabetes mellitus (HCC)    Past Surgical History:  Procedure Laterality Date   APPENDECTOMY  1970   BIOPSY  12/08/2017   Procedure: BIOPSY;  Surgeon: Corbin Ade, MD;  [facility];  Service: Endoscopy;;  esophagus    BIOPSY  12/28/2020   Procedure: BIOPSY;  Surgeon: Corbin Ade, MD;  [facility];  Service: Endoscopy;;   BIOPSY   07/12/2021   Procedure: BIOPSY;  Surgeon: Corbin Ade, MD;  [facility];  Service: Endoscopy;;   BREAST SURGERY Right    Cyst resection on the right   CARDIAC CATHETERIZATION  1990's   in [place]. no stent placement   CATARACT EXTRACTION W/PHACO Right 06/18/2016   Procedure: CATARACT EXTRACTION PHACO AND INTRAOCULAR LENS PLACEMENT (IOC);  Surgeon: Galen Manila, MD;  Location: [facility] ORS;  Service: Ophthalmology;  Laterality: Right;  [name] 35.7 AP% 16.6 CDE 5.94 Fluid pack lot # [id number]   CATARACT EXTRACTION W/PHACO Left 07/16/2016   Procedure: CATARACT EXTRACTION PHACO AND INTRAOCULAR LENS PLACEMENT (IOC);  Surgeon: Galen Manila, MD;  Location: [facility] ORS;  Service: Ophthalmology;  Laterality: Left;  [name] 51.8 AP% 12.7 CDE 6.58 Fluid Pack Lot # [id number]   CHOLECYSTECTOMY  1993   COLONOSCOPY  02/12/2007   VOZ:[id number] friable anal canal hemorrhoids, otherwise normal rectum and colon   COLONOSCOPY N/A 07/16/2012   RMR: Colonic diverticulosis. 4 mm tubular adenoma   COLONOSCOPY WITH PROPOFOL N/A 12/08/2017   Dr. Jena Gauss: sigmoid and descending colon diverticulosis, transverse colon polyp (TUBULAR ADENOMA)   COLONOSCOPY WITH PROPOFOL N/A 03/06/2022   Procedure: COLONOSCOPY WITH PROPOFOL;  Surgeon: Corbin Ade, MD;  [facility];  Service: Endoscopy;  Laterality: N/A;  12:30 PM   ENDARTERECTOMY Right 10/08/2019   Procedure: RIGHT CAROTID ENDARTERECTOMY;  Surgeon: Arbie Cookey,  Kristen Loader, MD;  Location: [facility] OR;  Service: Vascular;  Laterality: Right;   ESOPHAGOGASTRODUODENOSCOPY (EGD) WITH ESOPHAGEAL DILATION N/A 07/16/2012   RMR: +Candida esophagitis, Erosive reflux esophagiits. Schatizi's ring status post dilation. Hiatal hernia. Antral erosions status post biopsy.    ESOPHAGOGASTRODUODENOSCOPY (EGD) WITH PROPOFOL N/A 12/08/2017   Dr. Jena Gauss: esophagitis, query EOE but negative for increased eosinophils on path, small hiatal hernia, normal stomach, normal duodenum,  PAS stain with rare yeast forms on stain. Empiric dilation   ESOPHAGOGASTRODUODENOSCOPY (EGD) WITH PROPOFOL N/A 12/28/2020   esophagitis without bleeding, s/p biopsy and dilation. Antral erosions s/p biopsy.   ESOPHAGOGASTRODUODENOSCOPY (EGD) WITH PROPOFOL N/A 07/12/2021   Surgeon: Corbin Ade, MD;  Cobblestoning appearance of distal esophagus, slight narrowing of distal esophagus s/p dilation with 56 Fr and biopsy, gastric erosions biopsied, normal examined duodenum.  Esophageal biopsy with reactive squamous mucosa, gastric biopsy with reactive gastropathy, negative for H. pylori.   ESOPHAGOGASTRODUODENOSCOPY (EGD) WITH PROPOFOL N/A 03/06/2022   Procedure: ESOPHAGOGASTRODUODENOSCOPY (EGD) WITH PROPOFOL;  Surgeon: Corbin Ade, MD;  [facility];  Service: Endoscopy;  Laterality: N/A;   EYE SURGERY Bilateral 2018   cataract   GIVENS CAPSULE STUDY N/A 03/13/2022   Procedure: GIVENS CAPSULE STUDY;  Surgeon: Corbin Ade, MD;  [facility];  Service: Endoscopy;  Laterality: N/A;  7:30am   HERNIA REPAIR     JOINT REPLACEMENT     KNEE SURGERY     Left knee arthroscopy torn medial meniscus grade 4 chondral changes medial femoral condyle tibial plateau   MALONEY DILATION N/A 12/08/2017   Procedure: MALONEY DILATION;  Surgeon: Corbin Ade, MD;  [facility];  Service: Endoscopy;  Laterality: N/A;   MALONEY DILATION N/A 12/28/2020   Procedure: [name] DILATION;  Surgeon: Corbin Ade, MD;  [facility];  Service: Endoscopy;  Laterality: N/A;   MALONEY DILATION N/A 07/12/2021   Procedure: [name] DILATION;  Surgeon: Corbin Ade, MD;  [facility];  Service: Endoscopy;  Laterality: N/A;   PATCH ANGIOPLASTY Right 10/08/2019   Procedure: PATCH ANGIOPLASTY of the right common carotid artery using hemashield plaltinum finesse patch;  Surgeon: Larina Earthly, MD;  Location: [facility] OR;  Service: Vascular;  Laterality: Right;   PERIPHERAL  VASCULAR INTERVENTION  07/17/2020   Procedure: PERIPHERAL VASCULAR INTERVENTION;  Surgeon: Maeola Harman, MD;  Location: [facility] INVASIVE CV LAB;  Service: Cardiovascular;;   POLYPECTOMY  12/08/2017   Procedure: POLYPECTOMY;  Surgeon: Corbin Ade, MD;  [facility];  Service: Endoscopy;;  colon   POLYPECTOMY  03/06/2022   Procedure: POLYPECTOMY;  Surgeon: Corbin Ade, MD;  [facility];  Service: Endoscopy;;   TOTAL KNEE ARTHROPLASTY Left 04/27/2012   Procedure: TOTAL KNEE ARTHROPLASTY;  Surgeon: Vickki Hearing, MD;  Location: [facility];  Service: Orthopedics;  Laterality: Left;   TRANSCAROTID ARTERY REVASCULARIZATION  Left 10/12/2021   Procedure: Left Transcarotid Artery Revascularization;  Surgeon: Maeola Harman, MD;  Location: [facility] OR;  Service: Vascular;  Laterality: Left;   UMBILICAL HERNIA REPAIR  1993   VISCERAL ANGIOGRAPHY N/A 07/17/2020   Procedure: mesenteric ANGIOGRAPHY;  Surgeon: Maeola Harman, MD;  Location: [facility] INVASIVE CV LAB;  Service: Cardiovascular;  Laterality: N/A;   Social History   Tobacco Use   Smoking status: Every Day    Current packs/day: 0.50    Average packs/day: 0.5 packs/day for 30.0 years (15.0 ttl pk-yrs)    Types: Cigarettes  Smokeless tobacco: Former    Types: Chew    Quit date: 03/18/2018   Tobacco comments:    0.5 pk per day currently   Vaping Use   Vaping status: Former  Substance Use Topics   Alcohol use: Not Currently    Comment: socially "beer and tomato juice"    Drug use: No   Family History  Problem Relation Age of Onset   Dementia Mother    Hypertension Mother    Colon polyps Mother    Osteoporosis Mother    Cancer Father    Hypertension Father    Coronary artery disease Father        CABG in his 87's   Heart attack Father    Cancer - Lung Father    Heart disease Brother        has a pacemaker   Emphysema Maternal Grandmother    Cancer Maternal Grandmother    Cancer  - Lung Maternal Grandfather    Diabetes Maternal Grandfather    Cancer Paternal Grandmother    Heart attack Paternal Grandfather    Colon cancer Other        maternal great aunt   Colon cancer Other        paternal great aunt   No Known Allergies  Review of Systems  Constitutional:  Negative for chills and fever.  HENT:  Negative for sore throat.   Respiratory:  Negative for cough and shortness of breath.   Cardiovascular:  Negative for chest pain, palpitations and leg swelling.  Gastrointestinal:  Negative for abdominal pain, blood in stool, constipation, diarrhea, nausea and vomiting.  Genitourinary:  Negative for dysuria and hematuria.  Musculoskeletal:  Negative for myalgias.  Skin:  Negative for itching and rash.  Neurological:  Negative for dizziness and headaches.  Psychiatric/Behavioral:  Negative for depression and suicidal ideas.      Objective:     BP 118/74   Pulse 80   Ht 5\' 7"  (1.702 m)   Wt 129 lb (58.5 kg)   SpO2 95%   BMI 20.20 kg/m  BP Readings from Last 3 Encounters:  02/20/23 118/74  02/15/23 (!) 158/88  01/02/23 106/60   Physical Exam Vitals reviewed.  Constitutional:      General: He is not in acute distress.    Appearance: Normal appearance. He is not ill-appearing.  HENT:     Head: Normocephalic and atraumatic.     Right Ear: External ear normal.     Left Ear: External ear normal.     Nose: Nose normal. No congestion or rhinorrhea.     Mouth/Throat:     Mouth: Mucous membranes are moist.     Pharynx: Oropharynx is clear.  Eyes:     General: No scleral icterus.    Extraocular Movements: Extraocular movements intact.     Conjunctiva/sclera: Conjunctivae normal.     Pupils: Pupils are equal, round, and reactive to light.  Cardiovascular:     Rate and Rhythm: Normal rate and regular rhythm.     Pulses: Normal pulses.     Heart sounds: Murmur heard.  Pulmonary:     Effort: Pulmonary effort is normal.     Breath sounds: Normal breath  sounds. No wheezing, rhonchi or rales.  Abdominal:     General: Abdomen is flat. Bowel sounds are normal. There is no distension.     Palpations: Abdomen is soft.     Tenderness: There is no abdominal tenderness.  Musculoskeletal:  General: No swelling or deformity. Normal range of motion.     Cervical back: Normal range of motion.  Skin:    General: Skin is warm and dry.     Capillary Refill: Capillary refill takes less than 2 seconds.  Neurological:     General: No focal deficit present.     Mental Status: He is alert and oriented to person, place, and time.     Motor: No weakness.  Psychiatric:        Mood and Affect: Mood normal.        Behavior: Behavior normal.        Thought Content: Thought content normal.   Last CBC Lab Results  Component Value Date   WBC 4.8 02/15/2023   HGB 13.4 02/15/2023   HCT 38.2 (L) 02/15/2023   MCV 89.3 02/15/2023   MCH 31.3 02/15/2023   RDW 12.8 02/15/2023   PLT 253 02/15/2023   Last metabolic panel Lab Results  Component Value Date   GLUCOSE 156 (H) 02/15/2023   NA 137 02/15/2023   K 3.3 (L) 02/15/2023   CL 98 02/15/2023   CO2 30 02/15/2023   BUN 17 02/15/2023   CREATININE 1.06 02/15/2023   GFRNONAA >60 02/15/2023   CALCIUM 8.9 02/15/2023   PHOS 3.4 02/11/2023   PROT 7.1 02/11/2023   ALBUMIN 3.5 02/11/2023   LABGLOB 2.2 03/07/2022   AGRATIO 1.9 03/07/2022   BILITOT 0.5 02/11/2023   ALKPHOS 37 (L) 02/11/2023   AST 34 02/11/2023   ALT 16 02/11/2023   ANIONGAP 9 02/15/2023   Last lipids Lab Results  Component Value Date   CHOL 85 10/14/2022   HDL 35 (L) 10/14/2022   LDLCALC 8 10/14/2022   TRIG 212 (H) 10/14/2022   CHOLHDL 2.4 10/14/2022   Last hemoglobin A1c Lab Results  Component Value Date   HGBA1C 8.0 (H) 02/11/2023   Last thyroid functions Lab Results  Component Value Date   TSH 1.790 10/18/2013     Assessment & Plan:   Problem List Items Addressed This Visit       COPD (chronic obstructive  pulmonary disease) (HCC)   Previously prescribed Trelegy but he is not able to afford it currently.  Instead, he has been using his albuterol inhaler 4-5 times per day. -Discontinue Trelegy in favor of Breztri.  Samples provided today.  Continue as needed use of albuterol.      Acute respiratory failure with hypoxia (HCC)   Presenting today for hospital follow-up in the setting of recent admission 12/10-12/14 for acute hypoxic respiratory failure with COPD exacerbation secondary to influenza A.  Extubated by day 2 of hospital admission and discharge 12/14.  Pulmonary exam is unremarkable today.  Respiratory status has returned baseline.      Hypokalemia   K+ 3.3 at the time of hospital discharge.  Repeat BMP ordered today.      GAD (generalized anxiety disorder)   Lexapro 10 mg daily started during recent hospital admission.  Mood is stable currently and anxiety adequately controlled.       Return if symptoms worsen or fail to improve.    [name], MD

## 2023-02-20 NOTE — Assessment & Plan Note (Signed)
Lexapro 10 mg daily started during recent hospital admission.  Mood is stable currently and anxiety adequately controlled.

## 2023-02-21 LAB — BASIC METABOLIC PANEL
BUN/Creatinine Ratio: 11 (ref 10–24)
BUN: 11 mg/dL (ref 8–27)
CO2: 22 mmol/L (ref 20–29)
Calcium: 9.1 mg/dL (ref 8.6–10.2)
Chloride: 101 mmol/L (ref 96–106)
Creatinine, Ser: 0.96 mg/dL (ref 0.76–1.27)
Glucose: 129 mg/dL — ABNORMAL HIGH (ref 70–99)
Potassium: 4 mmol/L (ref 3.5–5.2)
Sodium: 140 mmol/L (ref 134–144)
eGFR: 88 mL/min/{1.73_m2} (ref >59)

## 2023-02-21 LAB — LIPID PANEL
Chol/HDL Ratio: 2.5 {ratio} (ref 0.0–5.0)
Cholesterol, Total: 99 mg/dL — ABNORMAL LOW (ref 100–199)
HDL: 40 mg/dL (ref >39)
LDL Chol Calc (NIH): 35 mg/dL (ref 0–99)
Triglycerides: 138 mg/dL (ref 0–149)
VLDL Cholesterol Cal: 24 mg/dL (ref 5–40)

## 2023-03-12 ENCOUNTER — Encounter: Payer: Self-pay | Admitting: Internal Medicine

## 2023-03-12 DIAGNOSIS — J9690 Respiratory failure, unspecified, unspecified whether with hypoxia or hypercapnia: Secondary | ICD-10-CM | POA: Diagnosis not present

## 2023-03-12 DIAGNOSIS — R519 Headache, unspecified: Secondary | ICD-10-CM | POA: Diagnosis not present

## 2023-03-12 DIAGNOSIS — J4A9 Chronic lung allograft dysfunction, unspecified: Secondary | ICD-10-CM | POA: Diagnosis not present

## 2023-03-12 DIAGNOSIS — J1089 Influenza due to other identified influenza virus with other manifestations: Secondary | ICD-10-CM | POA: Diagnosis not present

## 2023-03-12 DIAGNOSIS — J440 Chronic obstructive pulmonary disease with acute lower respiratory infection: Secondary | ICD-10-CM | POA: Diagnosis not present

## 2023-03-12 DIAGNOSIS — M542 Cervicalgia: Secondary | ICD-10-CM | POA: Diagnosis not present

## 2023-03-12 DIAGNOSIS — J1008 Influenza due to other identified influenza virus with other specified pneumonia: Secondary | ICD-10-CM | POA: Diagnosis not present

## 2023-03-12 DIAGNOSIS — J441 Chronic obstructive pulmonary disease with (acute) exacerbation: Secondary | ICD-10-CM | POA: Diagnosis not present

## 2023-03-12 DIAGNOSIS — A419 Sepsis, unspecified organism: Secondary | ICD-10-CM | POA: Diagnosis not present

## 2023-03-12 DIAGNOSIS — J690 Pneumonitis due to inhalation of food and vomit: Secondary | ICD-10-CM | POA: Diagnosis not present

## 2023-03-12 DIAGNOSIS — J14 Pneumonia due to Hemophilus influenzae: Secondary | ICD-10-CM | POA: Diagnosis not present

## 2023-03-12 DIAGNOSIS — N179 Acute kidney failure, unspecified: Secondary | ICD-10-CM | POA: Diagnosis not present

## 2023-03-12 DIAGNOSIS — Z9911 Dependence on respirator [ventilator] status: Secondary | ICD-10-CM | POA: Diagnosis not present

## 2023-03-12 DIAGNOSIS — R6521 Severe sepsis with septic shock: Secondary | ICD-10-CM | POA: Diagnosis not present

## 2023-03-12 DIAGNOSIS — J9602 Acute respiratory failure with hypercapnia: Secondary | ICD-10-CM | POA: Diagnosis not present

## 2023-03-12 DIAGNOSIS — R569 Unspecified convulsions: Secondary | ICD-10-CM | POA: Diagnosis not present

## 2023-03-12 DIAGNOSIS — Z4682 Encounter for fitting and adjustment of non-vascular catheter: Secondary | ICD-10-CM | POA: Diagnosis not present

## 2023-03-12 DIAGNOSIS — J09X2 Influenza due to identified novel influenza A virus with other respiratory manifestations: Secondary | ICD-10-CM | POA: Diagnosis not present

## 2023-03-12 DIAGNOSIS — R918 Other nonspecific abnormal finding of lung field: Secondary | ICD-10-CM | POA: Diagnosis not present

## 2023-03-12 DIAGNOSIS — E872 Acidosis, unspecified: Secondary | ICD-10-CM | POA: Diagnosis not present

## 2023-03-12 DIAGNOSIS — I6359 Cerebral infarction due to unspecified occlusion or stenosis of other cerebral artery: Secondary | ICD-10-CM | POA: Diagnosis not present

## 2023-03-12 DIAGNOSIS — Z452 Encounter for adjustment and management of vascular access device: Secondary | ICD-10-CM | POA: Diagnosis not present

## 2023-03-12 DIAGNOSIS — N281 Cyst of kidney, acquired: Secondary | ICD-10-CM | POA: Diagnosis not present

## 2023-03-12 DIAGNOSIS — J189 Pneumonia, unspecified organism: Secondary | ICD-10-CM | POA: Diagnosis not present

## 2023-03-12 DIAGNOSIS — Z9989 Dependence on other enabling machines and devices: Secondary | ICD-10-CM | POA: Diagnosis not present

## 2023-03-12 DIAGNOSIS — J9601 Acute respiratory failure with hypoxia: Secondary | ICD-10-CM | POA: Diagnosis not present

## 2023-03-12 DIAGNOSIS — I6782 Cerebral ischemia: Secondary | ICD-10-CM | POA: Diagnosis not present

## 2023-03-12 DIAGNOSIS — R4020 Unspecified coma: Secondary | ICD-10-CM | POA: Diagnosis not present

## 2023-03-12 DIAGNOSIS — I469 Cardiac arrest, cause unspecified: Secondary | ICD-10-CM | POA: Diagnosis not present

## 2023-03-14 DIAGNOSIS — I6782 Cerebral ischemia: Secondary | ICD-10-CM | POA: Diagnosis not present

## 2023-03-15 DIAGNOSIS — J189 Pneumonia, unspecified organism: Secondary | ICD-10-CM | POA: Diagnosis not present

## 2023-03-15 DIAGNOSIS — J4A9 Chronic lung allograft dysfunction, unspecified: Secondary | ICD-10-CM | POA: Diagnosis not present

## 2023-03-15 DIAGNOSIS — I469 Cardiac arrest, cause unspecified: Secondary | ICD-10-CM | POA: Diagnosis not present

## 2023-03-15 DIAGNOSIS — J09X2 Influenza due to identified novel influenza A virus with other respiratory manifestations: Secondary | ICD-10-CM | POA: Diagnosis not present

## 2023-03-18 ENCOUNTER — Ambulatory Visit: Payer: Medicare PPO | Admitting: Internal Medicine

## 2023-03-26 ENCOUNTER — Encounter (HOSPITAL_COMMUNITY): Payer: Self-pay

## 2023-03-26 ENCOUNTER — Ambulatory Visit: Payer: Medicare PPO | Admitting: Vascular Surgery

## 2023-03-26 ENCOUNTER — Ambulatory Visit (HOSPITAL_COMMUNITY): Payer: Medicare PPO

## 2023-04-02 ENCOUNTER — Telehealth: Payer: Self-pay | Admitting: Internal Medicine

## 2023-04-02 NOTE — Telephone Encounter (Signed)
Patient spouse Renea Ee came by to drop off guarantee [employer] form needs his pcp to fill out for the funeral home. Call patient at [phone] when ready.

## 2023-04-05 DEATH — deceased

## 2023-04-10 ENCOUNTER — Telehealth: Payer: Self-pay | Admitting: Internal Medicine

## 2023-04-10 NOTE — Telephone Encounter (Signed)
 Copied from CRM [id number]. Topic: General - Deceased Patient >> 04-25-2023 11:45 AM Carmell SAUNDERS wrote: Name of caller: Hargis Sax  Date of death: Mar 31, 2023   Name of funeral home: n/a  Phone number of funeral home: n/a  Provider that needs to sign form: Manus Fireman  Timeline for signing: As soon as possible  Mrs. Calleros is checking the status of the forms she dropped off 04/02/23. The funeral home needs this to send to the medical examiner in [place]. They advised this needs to be done right away. Please follow up as soon as possible.

## 2023-04-11 NOTE — Telephone Encounter (Signed)
 LVM to inform Cato Cockayne

## 2023-04-11 NOTE — Telephone Encounter (Signed)
 Form with ROI released to [employer] Faxed with confirmation

## 2023-04-15 ENCOUNTER — Telehealth: Payer: Self-pay | Admitting: Internal Medicine

## 2023-04-15 NOTE — Telephone Encounter (Signed)
Faxed

## 2023-04-15 NOTE — Telephone Encounter (Signed)
Copied from CRM [id number]. Topic: General - Other >> Apr 15, 2023  9:42 AM Jimmy Duran wrote: Reason for CRM: Patient dropped off a form in regards to the funeral home and life insurance - Patient's spouse would like to know if we still have that paperwork and if it can be faxed over to the funeral home.  [facility] home Fax number is [phone]

## 2023-04-21 NOTE — Telephone Encounter (Signed)
[employer] form faxed to [phone]

## 2023-06-09 ENCOUNTER — Ambulatory Visit: Payer: Medicare PPO | Admitting: Adult Health
